# Patient Record
Sex: Male | Born: 1970 | Race: White | Hispanic: No | Marital: Married | Smoking: Never smoker
Health system: Southern US, Community
[De-identification: ages and names within clinical notes are randomized; demographics above are authoritative.]

## PROBLEM LIST (undated history)

## (undated) DIAGNOSIS — L578 Other skin changes due to chronic exposure to nonionizing radiation: Secondary | ICD-10-CM

## (undated) DIAGNOSIS — G4730 Sleep apnea, unspecified: Secondary | ICD-10-CM

## (undated) DIAGNOSIS — L910 Hypertrophic scar: Secondary | ICD-10-CM

## (undated) DIAGNOSIS — Z0000 Encounter for general adult medical examination without abnormal findings: Secondary | ICD-10-CM

## (undated) DIAGNOSIS — G35D Multiple sclerosis, unspecified: Secondary | ICD-10-CM

## (undated) DIAGNOSIS — L259 Unspecified contact dermatitis, unspecified cause: Secondary | ICD-10-CM

## (undated) DIAGNOSIS — K5900 Constipation, unspecified: Secondary | ICD-10-CM

## (undated) DIAGNOSIS — J209 Acute bronchitis, unspecified: Secondary | ICD-10-CM

## (undated) DIAGNOSIS — I10 Essential (primary) hypertension: Secondary | ICD-10-CM

## (undated) DIAGNOSIS — E349 Endocrine disorder, unspecified: Secondary | ICD-10-CM

## (undated) DIAGNOSIS — E039 Hypothyroidism, unspecified: Secondary | ICD-10-CM

## (undated) DIAGNOSIS — I2510 Atherosclerotic heart disease of native coronary artery without angina pectoris: Secondary | ICD-10-CM

## (undated) DIAGNOSIS — K219 Gastro-esophageal reflux disease without esophagitis: Secondary | ICD-10-CM

## (undated) DIAGNOSIS — H539 Unspecified visual disturbance: Secondary | ICD-10-CM

## (undated) DIAGNOSIS — E669 Obesity, unspecified: Secondary | ICD-10-CM

## (undated) DIAGNOSIS — F418 Other specified anxiety disorders: Secondary | ICD-10-CM

## (undated) DIAGNOSIS — B353 Tinea pedis: Secondary | ICD-10-CM

## (undated) DIAGNOSIS — R5383 Other fatigue: Secondary | ICD-10-CM

## (undated) DIAGNOSIS — I219 Acute myocardial infarction, unspecified: Secondary | ICD-10-CM

## (undated) DIAGNOSIS — T7840XA Allergy, unspecified, initial encounter: Secondary | ICD-10-CM

## (undated) DIAGNOSIS — E785 Hyperlipidemia, unspecified: Secondary | ICD-10-CM

## (undated) DIAGNOSIS — B351 Tinea unguium: Secondary | ICD-10-CM

## (undated) DIAGNOSIS — F32A Depression, unspecified: Secondary | ICD-10-CM

## (undated) DIAGNOSIS — B019 Varicella without complication: Secondary | ICD-10-CM

## (undated) DIAGNOSIS — I83819 Varicose veins of unspecified lower extremities with pain: Secondary | ICD-10-CM

## (undated) DIAGNOSIS — F329 Major depressive disorder, single episode, unspecified: Secondary | ICD-10-CM

## (undated) DIAGNOSIS — B372 Candidiasis of skin and nail: Secondary | ICD-10-CM

## (undated) DIAGNOSIS — G35 Multiple sclerosis: Secondary | ICD-10-CM

## (undated) DIAGNOSIS — G709 Myoneural disorder, unspecified: Secondary | ICD-10-CM

## (undated) HISTORY — DX: Major depressive disorder, single episode, unspecified: F32.9

## (undated) HISTORY — DX: Essential (primary) hypertension: I10

## (undated) HISTORY — DX: Varicella without complication: B01.9

## (undated) HISTORY — DX: Unspecified visual disturbance: H53.9

## (undated) HISTORY — DX: Hyperlipidemia, unspecified: E78.5

## (undated) HISTORY — DX: Acute bronchitis, unspecified: J20.9

## (undated) HISTORY — DX: Unspecified contact dermatitis, unspecified cause: L25.9

## (undated) HISTORY — DX: Encounter for general adult medical examination without abnormal findings: Z00.00

## (undated) HISTORY — DX: Obesity, unspecified: E66.9

## (undated) HISTORY — DX: Multiple sclerosis, unspecified: G35.D

## (undated) HISTORY — DX: Atherosclerotic heart disease of native coronary artery without angina pectoris: I25.10

## (undated) HISTORY — DX: Depression, unspecified: F32.A

## (undated) HISTORY — DX: Allergy, unspecified, initial encounter: T78.40XA

## (undated) HISTORY — PX: OTHER SURGICAL HISTORY: SHX169

## (undated) HISTORY — DX: Other fatigue: R53.83

## (undated) HISTORY — DX: Tinea unguium: B35.1

## (undated) HISTORY — DX: Other specified anxiety disorders: F41.8

## (undated) HISTORY — DX: Hypertrophic scar: L91.0

## (undated) HISTORY — DX: Constipation, unspecified: K59.00

## (undated) HISTORY — DX: Other skin changes due to chronic exposure to nonionizing radiation: L57.8

## (undated) HISTORY — DX: Acute myocardial infarction, unspecified: I21.9

## (undated) HISTORY — DX: Endocrine disorder, unspecified: E34.9

## (undated) HISTORY — PX: CORONARY ANGIOPLASTY WITH STENT PLACEMENT: SHX49

## (undated) HISTORY — DX: Tinea pedis: B35.3

## (undated) HISTORY — DX: Varicose veins of unspecified lower extremity with pain: I83.819

## (undated) HISTORY — DX: Multiple sclerosis: G35

## (undated) HISTORY — PX: VASECTOMY: SHX75

## (undated) HISTORY — PX: WISDOM TOOTH EXTRACTION: SHX21

## (undated) HISTORY — PX: VASCULAR SURGERY: SHX849

## (undated) HISTORY — DX: Gastro-esophageal reflux disease without esophagitis: K21.9

## (undated) HISTORY — DX: Candidiasis of skin and nail: B37.2

## (undated) HISTORY — DX: Hypothyroidism, unspecified: E03.9

---

## 2007-06-09 ENCOUNTER — Encounter (HOSPITAL_COMMUNITY): Admission: RE | Admit: 2007-06-09 | Discharge: 2007-09-07 | Payer: Self-pay | Admitting: Neurology

## 2007-09-30 ENCOUNTER — Encounter (HOSPITAL_COMMUNITY): Admission: RE | Admit: 2007-09-30 | Discharge: 2007-12-08 | Payer: Self-pay | Admitting: Neurology

## 2011-04-20 ENCOUNTER — Emergency Department (HOSPITAL_COMMUNITY): Payer: Managed Care, Other (non HMO)

## 2011-04-20 ENCOUNTER — Inpatient Hospital Stay (HOSPITAL_COMMUNITY)
Admission: EM | Admit: 2011-04-20 | Discharge: 2011-04-23 | DRG: 247 | Disposition: A | Discharge status: Home / Self Care | Payer: Managed Care, Other (non HMO) | Attending: Cardiology | Admitting: Cardiology

## 2011-04-20 DIAGNOSIS — R079 Chest pain, unspecified: Secondary | ICD-10-CM

## 2011-04-20 DIAGNOSIS — E785 Hyperlipidemia, unspecified: Secondary | ICD-10-CM | POA: Diagnosis present

## 2011-04-20 DIAGNOSIS — M216X9 Other acquired deformities of unspecified foot: Secondary | ICD-10-CM | POA: Diagnosis present

## 2011-04-20 DIAGNOSIS — I10 Essential (primary) hypertension: Secondary | ICD-10-CM | POA: Diagnosis present

## 2011-04-20 DIAGNOSIS — G35 Multiple sclerosis: Secondary | ICD-10-CM | POA: Diagnosis present

## 2011-04-20 DIAGNOSIS — I214 Non-ST elevation (NSTEMI) myocardial infarction: Principal | ICD-10-CM | POA: Diagnosis present

## 2011-04-20 DIAGNOSIS — E039 Hypothyroidism, unspecified: Secondary | ICD-10-CM | POA: Diagnosis present

## 2011-04-20 DIAGNOSIS — I2510 Atherosclerotic heart disease of native coronary artery without angina pectoris: Secondary | ICD-10-CM | POA: Diagnosis present

## 2011-04-20 DIAGNOSIS — I219 Acute myocardial infarction, unspecified: Secondary | ICD-10-CM

## 2011-04-20 DIAGNOSIS — Z7982 Long term (current) use of aspirin: Secondary | ICD-10-CM

## 2011-04-20 HISTORY — DX: Acute myocardial infarction, unspecified: I21.9

## 2011-04-20 LAB — CBC
HCT: 40.2 % (ref 39.0–52.0)
Hemoglobin: 13.8 g/dL (ref 13.0–17.0)
MCH: 29.8 pg (ref 26.0–34.0)
MCHC: 34.3 g/dL (ref 30.0–36.0)
MCV: 86.8 fL (ref 78.0–100.0)
Platelets: 237 10*3/uL (ref 150–400)
RBC: 4.63 MIL/uL (ref 4.22–5.81)
RDW: 14.3 % (ref 11.5–15.5)
WBC: 11.6 10*3/uL — ABNORMAL HIGH (ref 4.0–10.5)

## 2011-04-20 LAB — DIFFERENTIAL
Basophils Absolute: 0.1 10*3/uL (ref 0.0–0.1)
Basophils Relative: 0 % (ref 0–1)
Eosinophils Absolute: 0 10*3/uL (ref 0.0–0.7)
Eosinophils Relative: 0 % (ref 0–5)
Lymphocytes Relative: 14 % (ref 12–46)
Lymphs Abs: 1.7 10*3/uL (ref 0.7–4.0)
Monocytes Absolute: 0.6 10*3/uL (ref 0.1–1.0)
Monocytes Relative: 5 % (ref 3–12)
Neutro Abs: 9.4 10*3/uL — ABNORMAL HIGH (ref 1.7–7.7)
Neutrophils Relative %: 80 % — ABNORMAL HIGH (ref 43–77)

## 2011-04-20 LAB — COMPREHENSIVE METABOLIC PANEL
ALT: 27 U/L (ref 0–53)
AST: 25 U/L (ref 0–37)
Albumin: 4.1 g/dL (ref 3.5–5.2)
Alkaline Phosphatase: 65 U/L (ref 39–117)
BUN: 22 mg/dL (ref 6–23)
CO2: 25 mEq/L (ref 19–32)
Calcium: 9.3 mg/dL (ref 8.4–10.5)
Chloride: 103 mEq/L (ref 96–112)
Creatinine, Ser: 0.89 mg/dL (ref 0.4–1.5)
GFR calc Af Amer: >60 mL/min (ref >60)
GFR calc non Af Amer: >60 mL/min (ref >60)
Glucose, Bld: 160 mg/dL — ABNORMAL HIGH (ref 70–99)
Potassium: 4.2 mEq/L (ref 3.5–5.1)
Sodium: 138 mEq/L (ref 135–145)
Total Bilirubin: 0.4 mg/dL (ref 0.3–1.2)
Total Protein: 7.2 g/dL (ref 6.0–8.3)

## 2011-04-20 LAB — POCT I-STAT, CHEM 8
BUN: 23 mg/dL (ref 6–23)
Calcium, Ion: 1.11 mmol/L — ABNORMAL LOW (ref 1.12–1.32)
Chloride: 107 mEq/L (ref 96–112)
Creatinine, Ser: 1.1 mg/dL (ref 0.4–1.5)
Glucose, Bld: 160 mg/dL — ABNORMAL HIGH (ref 70–99)
HCT: 43 % (ref 39.0–52.0)
Hemoglobin: 14.6 g/dL (ref 13.0–17.0)
Potassium: 4.1 mEq/L (ref 3.5–5.1)
Sodium: 140 mEq/L (ref 135–145)
TCO2: 23 mmol/L (ref 0–100)

## 2011-04-20 LAB — CK TOTAL AND CKMB (NOT AT ARMC)
CK, MB: 12.8 ng/mL (ref 0.3–4.0)
Relative Index: 6.4 — ABNORMAL HIGH (ref 0.0–2.5)
Total CK: 200 U/L (ref 7–232)

## 2011-04-20 LAB — LIPASE, BLOOD: Lipase: 32 U/L (ref 11–59)

## 2011-04-20 LAB — TROPONIN I: Troponin I: 1.17 ng/mL (ref <0.30)

## 2011-04-20 LAB — POCT CARDIAC MARKERS
CKMB, poc: 7.9 ng/mL (ref 1.0–8.0)
Myoglobin, poc: 91.3 ng/mL (ref 12–200)
Troponin i, poc: 0.35 ng/mL (ref 0.00–0.09)

## 2011-04-21 DIAGNOSIS — I2510 Atherosclerotic heart disease of native coronary artery without angina pectoris: Secondary | ICD-10-CM

## 2011-04-21 LAB — BASIC METABOLIC PANEL
BUN: 17 mg/dL (ref 6–23)
CO2: 29 mEq/L (ref 19–32)
Calcium: 9.1 mg/dL (ref 8.4–10.5)
Chloride: 105 mEq/L (ref 96–112)
Creatinine, Ser: 0.78 mg/dL (ref 0.4–1.5)
GFR calc Af Amer: >60 mL/min (ref >60)
GFR calc non Af Amer: >60 mL/min (ref >60)
Glucose, Bld: 116 mg/dL — ABNORMAL HIGH (ref 70–99)
Potassium: 4.2 mEq/L (ref 3.5–5.1)
Sodium: 140 mEq/L (ref 135–145)

## 2011-04-21 LAB — CARDIAC PANEL(CRET KIN+CKTOT+MB+TROPI)
CK, MB: 5 ng/mL — ABNORMAL HIGH (ref 0.3–4.0)
Relative Index: INVALID (ref 0.0–2.5)
Total CK: 85 U/L (ref 7–232)
Troponin I: 2.2 ng/mL (ref <0.30)

## 2011-04-21 LAB — CBC
HCT: 38.1 % — ABNORMAL LOW (ref 39.0–52.0)
Hemoglobin: 12.7 g/dL — ABNORMAL LOW (ref 13.0–17.0)
MCH: 28.9 pg (ref 26.0–34.0)
MCHC: 33.3 g/dL (ref 30.0–36.0)
MCV: 86.6 fL (ref 78.0–100.0)
Platelets: 227 10*3/uL (ref 150–400)
RBC: 4.4 MIL/uL (ref 4.22–5.81)
RDW: 14.4 % (ref 11.5–15.5)
WBC: 11.7 10*3/uL — ABNORMAL HIGH (ref 4.0–10.5)

## 2011-04-21 LAB — MRSA PCR SCREENING: MRSA by PCR: NEGATIVE

## 2011-04-21 LAB — PROTIME-INR
INR: 1 (ref 0.00–1.49)
Prothrombin Time: 13.4 seconds (ref 11.6–15.2)

## 2011-04-21 LAB — LIPID PANEL
Cholesterol: 152 mg/dL (ref 0–200)
HDL: 70 mg/dL (ref >39)
LDL Cholesterol: 73 mg/dL (ref 0–99)
Total CHOL/HDL Ratio: 2.2 RATIO
Triglycerides: 44 mg/dL (ref <150)
VLDL: 9 mg/dL (ref 0–40)

## 2011-04-21 LAB — TSH: TSH: 0.231 u[IU]/mL — ABNORMAL LOW (ref 0.350–4.500)

## 2011-04-21 LAB — POCT ACTIVATED CLOTTING TIME: Activated Clotting Time: 470 seconds

## 2011-04-21 LAB — HEPARIN LEVEL (UNFRACTIONATED): Heparin Unfractionated: 0.16 IU/mL — ABNORMAL LOW (ref 0.30–0.70)

## 2011-04-22 LAB — BASIC METABOLIC PANEL
BUN: 14 mg/dL (ref 6–23)
CO2: 30 mEq/L (ref 19–32)
Calcium: 9.3 mg/dL (ref 8.4–10.5)
Chloride: 105 mEq/L (ref 96–112)
Creatinine, Ser: 0.84 mg/dL (ref 0.4–1.5)
GFR calc Af Amer: >60 mL/min (ref >60)
GFR calc non Af Amer: >60 mL/min (ref >60)
Glucose, Bld: 91 mg/dL (ref 70–99)
Potassium: 4.2 mEq/L (ref 3.5–5.1)
Sodium: 142 mEq/L (ref 135–145)

## 2011-04-22 LAB — POCT ACTIVATED CLOTTING TIME: Activated Clotting Time: 393 seconds

## 2011-04-22 LAB — CBC
HCT: 40.7 % (ref 39.0–52.0)
Hemoglobin: 13.5 g/dL (ref 13.0–17.0)
MCH: 29.2 pg (ref 26.0–34.0)
MCHC: 33.2 g/dL (ref 30.0–36.0)
MCV: 88.1 fL (ref 78.0–100.0)
Platelets: 201 10*3/uL (ref 150–400)
RBC: 4.62 MIL/uL (ref 4.22–5.81)
RDW: 14.4 % (ref 11.5–15.5)
WBC: 10.8 10*3/uL — ABNORMAL HIGH (ref 4.0–10.5)

## 2011-04-23 DIAGNOSIS — I214 Non-ST elevation (NSTEMI) myocardial infarction: Secondary | ICD-10-CM

## 2011-04-23 LAB — BASIC METABOLIC PANEL
BUN: 12 mg/dL (ref 6–23)
CO2: 30 mEq/L (ref 19–32)
Calcium: 8.8 mg/dL (ref 8.4–10.5)
Chloride: 105 mEq/L (ref 96–112)
Creatinine, Ser: 0.92 mg/dL (ref 0.4–1.5)
GFR calc Af Amer: >60 mL/min (ref >60)
GFR calc non Af Amer: >60 mL/min (ref >60)
Glucose, Bld: 86 mg/dL (ref 70–99)
Potassium: 4 mEq/L (ref 3.5–5.1)
Sodium: 141 mEq/L (ref 135–145)

## 2011-04-23 LAB — CBC
HCT: 39.5 % (ref 39.0–52.0)
Hemoglobin: 13.1 g/dL (ref 13.0–17.0)
MCH: 28.7 pg (ref 26.0–34.0)
MCHC: 33.2 g/dL (ref 30.0–36.0)
MCV: 86.4 fL (ref 78.0–100.0)
Platelets: 192 10*3/uL (ref 150–400)
RBC: 4.57 MIL/uL (ref 4.22–5.81)
RDW: 14 % (ref 11.5–15.5)
WBC: 9.6 10*3/uL (ref 4.0–10.5)

## 2011-04-28 ENCOUNTER — Telehealth: Payer: Self-pay | Admitting: Cardiovascular Disease

## 2011-04-28 NOTE — Telephone Encounter (Signed)
Pt having a filling at the dentist, will he require any meds?

## 2011-04-28 NOTE — Discharge Summary (Addendum)
NAME:  Keith Morrow, Keith Morrow NO.:  [id number]  MEDICAL RECORD NO.:  [id number]           PATIENT TYPE:  I  LOCATION:  6525                         FACILITY:  [facility]  PHYSICIAN:  Veverly Fells. Excell Seltzer, MD  DATE OF BIRTH:  02/04/1971  DATE OF ADMISSION:  04/20/2011 DATE OF DISCHARGE:  04/23/2011                              DISCHARGE SUMMARY   DISCHARGE DIAGNOSES: 1. Non-ST-elevation myocardial infarction with peak troponin of 2.20     this admission. 2. Newly diagnosed coronary artery disease.     a.     Status post percutaneous coronary intervention/drug-eluting      stent placement to the left anterior descending and balloon      angioplasty of the diagonal, Apr 21, 2011.     b.     Status post staged percutaneous coronary intervention/drug-      eluting stent placement to the right posterior atrioventricular      branch, Apr 22, 2011. 3. Normal left ventricular function by catheterization, Apr 21, 2011. 4. Hypothyroidism.     a.     TSH approximately 0.231, require outpatient review with      primary care doctor. 5. Multiple sclerosis. 6. Hypertension. 7. Hyperlipidemia, Lipitor increased to 40 mg at bedtime. 8. Status post right wrist surgery. 9. Status post vasectomy.  HOSPITAL COURSE:  Keith Morrow is a 40 year old gentleman with a history of hypertension, hyperlipidemia, multiple sclerosis who has no prior cardiac history.  He has had some chest discomfort on and off for a couple of weeks, and while working in the yard prior to the date of admission had severe chest discomfort with some discomfort in his back as well.  On day of admission, he had same discomfort at rest, somewhat radiating to his jaw, associated with nausea and diaphoresis.  He subsequently came to [facility] where initial cardiac enzymes were elevated to a peak troponin of 2.20.  He did have 1-mm ST-elevation in lead V2 only with ischemic T-wave changes in V2 and V4.  He was pain free on  evaluation by Cardiology.  He was treated with aspirin, heparin, and loaded with 150 mg of Plavix at 75 mg daily.  He was taken to the cath lab on Apr 21, 2011 by Dr. Excell Seltzer and subsequently had drug-eluting stents placement to the proximal LAD and first diagonal.  He also had residual PLA branch stenosis, nonobstructive left circumflex stenosis. He was brought back for staged PCI and stenting of the posterior AV segment on Apr 22, 2011 by Dr. Excell Seltzer.  The patient tolerated this procedure well.  Dr. Excell Seltzer has seen and examined him today and feels he is stable for discharge.  DISCHARGE LABORATORY DATA:  WBC 9.6, hemoglobin 13.1, hematocrit 39.5, and platelet count 192.  Sodium 141, potassium 4, chloride 105, CO2 30, glucose 86, BUN 12, and creatinine 0.92.  TSH was 0.231.  Total cholesterol 152, triglycerides 44, HDL 70, and LDL 73.  STUDIES: 1. Chest x-ray, Apr 20, 2011 showed no acute cardiopulmonary findings. 2. Cardiac catheterization, May 14 and Apr 22, 2011, please see full     report  for details.  DISCHARGE MEDICATIONS: 1. Aspirin 81 mg daily. 2. Lopressor 25 mg b.i.d. 3. Nitro sublingual 0.4 mg every 5 minutes as needed up to 3 doses for     chest pain. 4. Effient 10 mg daily. 5. Lipitor 40 mg at bedtime, increased per Dr. Excell Seltzer. 6. Ampyra 10 mg b.i.d. 7. Benicar 20 mg daily. 8. Cymbalta 60 mg b.i.d. 9. Prilosec 20 mg every morning. 10.Synthroid 150 mcg every evening, on discharge to follow up with PCP     for repeat TSH and free T4. 11.Triamcinolone 0.1% topical cream 1 application topically b.i.d.  He was taking prednisone taper for a poison ivy prior to admission, this was discontinued.  DISPOSITION:  Keith Morrow will be discharged in stable condition to home. Per Dr. Excell Seltzer, he is not to return to work until May 06, 2011.  He is not to lift anything over 5 pounds for 1 week, drive for 2 days, and participate in sexual activity for 1 week.  He is to follow a  heart- healthy diet and call or return if he notices any pain, swelling, bleeding, or pus at the cath site.  As above, he is also to follow up with his PCP for recheck of his TSH and free T4.  He will follow up with Dr. Earmon Phoenix office to see [name], PA-C for his first followup visit on May 20, 2011 at 9:30 a.m.  DURATION OF DISCHARGE ENCOUNTER:  Greater than 30 minutes including physician and PA time.     Ronie Spies, P.A.C.   ______________________________ Veverly Fells. Excell Seltzer, MD    DD/MEDQ  D:  04/23/2011  T:  04/23/2011  Job:  [id number]  cc:   Dr. Rosebud Poles, MD  Electronically Signed by Ronie Spies  on 04/28/2011 01:59:51 PM Electronically Signed by Tonny Bollman MD on 05/29/2011 07:32:42 AM

## 2011-04-29 ENCOUNTER — Telehealth: Payer: Self-pay | Admitting: Cardiovascular Disease

## 2011-04-29 NOTE — Telephone Encounter (Signed)
Pt needs to have a filling done and is in the chair dose he need pre med call asap

## 2011-04-29 NOTE — Telephone Encounter (Signed)
I spoke with Dr Lilyan Punt office and made them aware that the pt just had a recent stent placed 04/21/11 and 04/22/11.  I made them aware that the pt should have SBE due to recent stent placement.  They will prescribe Amoxicillin 500mg  take 4 capsules by mouth one hour prior to dental work.

## 2011-04-29 NOTE — Telephone Encounter (Signed)
RN reviewed chart: Pt had stent placed on Apr 21, 2011 and should have SBE. RN attempted 3 times to call Dentist's office; line busy.

## 2011-04-29 NOTE — Telephone Encounter (Signed)
Per note from Mylo Red RN the pt's dental office was closed when she called the office on 04/28/11.  Please see new message on 04/29/11.

## 2011-04-30 ENCOUNTER — Telehealth: Payer: Self-pay | Admitting: Cardiovascular Disease

## 2011-04-30 NOTE — Telephone Encounter (Signed)
[phone] pt rtn call

## 2011-04-30 NOTE — Telephone Encounter (Signed)
Left message for pt to call back  °

## 2011-04-30 NOTE — Telephone Encounter (Signed)
I spoke with the pt and he complains of itching at his cath site (right radial artery) that started two days ago.  Yesterday the pt noticed a red rash around the insertion site about the size of a half dollar. The pt has "slight swelling" at site and it is not warm to touch.  The pt denies any pain at site and the bruising is improving.  The pt is not allergic to latex or tape to his knowledge.  I offered to have the pt come into the office to have me look at his site and then make a decision if he requires a vascular ultrasound.  The pt asked if he could put Benadryl cream on the site.  I made the pt aware that he could try hydrocortisone cream as needed.  The pt would like to try this today and then call the office tomorrow if he does not have any improvement in symptoms.

## 2011-04-30 NOTE — Telephone Encounter (Signed)
Pt having redness, itching and swelling at stent placement site-was done last week-pls advise

## 2011-05-06 ENCOUNTER — Encounter: Payer: Self-pay | Admitting: Physician Assistant

## 2011-05-13 ENCOUNTER — Encounter: Payer: Self-pay | Admitting: Physician Assistant

## 2011-05-13 ENCOUNTER — Ambulatory Visit (INDEPENDENT_AMBULATORY_CARE_PROVIDER_SITE_OTHER): Payer: Managed Care, Other (non HMO) | Admitting: Physician Assistant

## 2011-05-13 VITALS — BP 114/72 | HR 65 | Ht 76.0 in | Wt 284.0 lb

## 2011-05-13 DIAGNOSIS — I2510 Atherosclerotic heart disease of native coronary artery without angina pectoris: Secondary | ICD-10-CM | POA: Insufficient documentation

## 2011-05-13 DIAGNOSIS — I10 Essential (primary) hypertension: Secondary | ICD-10-CM

## 2011-05-13 DIAGNOSIS — E785 Hyperlipidemia, unspecified: Secondary | ICD-10-CM | POA: Insufficient documentation

## 2011-05-13 DIAGNOSIS — G35 Multiple sclerosis: Secondary | ICD-10-CM | POA: Insufficient documentation

## 2011-05-13 DIAGNOSIS — E039 Hypothyroidism, unspecified: Secondary | ICD-10-CM | POA: Insufficient documentation

## 2011-05-13 LAB — TSH: TSH: 0.69 u[IU]/mL (ref 0.35–5.50)

## 2011-05-13 LAB — T4, FREE: Free T4: 0.91 ng/dL (ref 0.60–1.60)

## 2011-05-13 NOTE — Assessment & Plan Note (Signed)
Managed by PCP.  Goal LDL < 70.  Needs repeat Lipids in 2-3 mos.

## 2011-05-13 NOTE — Assessment & Plan Note (Signed)
Notes BP low at times.  But, mostly in 110 range systolically.  He will monitor.  I advised him to cut Benicar in half if symptoms continue.

## 2011-05-13 NOTE — Assessment & Plan Note (Signed)
Doing well post MI and PCI.  No angina.  Continue asa, Effient and statin.  Discussed importance of dual anti-platelet therapy.  Refer to cardiac rehab.  Discussed diet and salt today.  Discussed how to increase activity.  Discussed use of NTG.  Discussed need for control of lipids.  Schedule follow up with Dr. Excell Seltzer in 6-8 weeks.

## 2011-05-13 NOTE — Patient Instructions (Signed)
Your physician recommends that you schedule a follow-up appointment in: 6-8 WEEKS WITH DR. Excell Seltzer AS PER SCOTT WEAVER, PA-C  You have been referred to CARDIAC REHAB 414.01, 401.9 PRIMARY CARD IS COOPER.  Your physician recommends that you return for lab work in: TODAY TSH, FREE T4 244.9 HYPOTHYROIDISM WE WILL FAX RESULTS TO YOUR PRIMARY CARE PHYSICIAN.  Your physician recommends that you continue on your current medications as directed. Please refer to the Current Medication list given to you today.

## 2011-05-13 NOTE — Assessment & Plan Note (Signed)
TSH low in hospital.  Will repeat TSH and Free T4 and send results to PCP.

## 2011-05-13 NOTE — Progress Notes (Signed)
History of Present Illness: Primary Cardiologist: Dr. Tonny Bollman  Keith Morrow is a 40 y.o. male with a h/o HTN, HLP, hypothyroidism and multiple sclerosis who presented to [facility] with chest pain on 5/13.  He r/i for NSTEMI and underwent cath with Dr. Excell Seltzer on 5/14 that revealed a mid LAD/ostial Dx bifurcation lesion.  LAD 90-95% lesion treated with Promus DES and Dx 90% lesion treated with POBA.  He also had a PL branch with 80-90% stenosis and was brought back the next day for staged PCI with a Promus DES.  EF 55%.  Post PCI course uneventful.  He presents for follow up.  Labs: Na 141, K 4, Creat 0.92, Hgb 13.1, TC 152, TG 44, HDL 70, LDL 73, ALT 27, TSH 0.231 (needs follow up); CXR non-acute  Doing well.  Has been walking a lot.  Denies chest pain.  No dyspnea.  No syncope or light headedness.  No orthopnea, PND or edema.  Gets fatigued sometimes and notes BP in 90s systolic.  Has lots of questions today about diet, medications, follow up.  I spent about 30 minutes today answering all of his questions.   Past Medical History  Diagnosis Date  . HTN (hypertension)   . Hypothyroidism   . Hyperlipidemia   . MS (multiple sclerosis)   . Coronary atherosclerosis of native coronary artery     a. NSTEMI 04/20/11: tx with Promus DES to LAD; bifurcation lesion with oDx 90% tx with POBA; b. staged PCI of right post. AV Branch with Promus DES; c. residual at cath 04/21/11:  AV groove CFX 50%, mRCA 30%,; EF 55%    Current Outpatient Prescriptions  Medication Sig Dispense Refill  . atorvastatin (LIPITOR) 40 MG tablet Take 40 mg by mouth daily.        . Dalfampridine (AMPYRA) 10 MG TB12 Take by mouth. Every 12 hours       . DULoxetine (CYMBALTA) 60 MG capsule Take 60 mg by mouth 2 (two) times daily.        Marland Kitchen levothyroxine (SYNTHROID, LEVOTHROID) 150 MCG tablet Take 150 mcg by mouth daily.        . metoprolol (LOPRESSOR) 50 MG tablet Take 50 mg by mouth 2 (two) times daily.        Marland Kitchen olmesartan  (BENICAR) 20 MG tablet Take 20 mg by mouth daily.        Marland Kitchen omeprazole (PRILOSEC) 20 MG capsule Take 20 mg by mouth daily.        . prasugrel (EFFIENT) 10 MG TABS Take by mouth daily.        Marland Kitchen DISCONTD: atorvastatin (LIPITOR) 10 MG tablet Take 10 mg by mouth daily.          Allergies: No Known Allergies  Vital Signs: BP 114/72  Pulse 65  Ht 6\' 4"  (1.93 m)  Wt 284 lb (128.822 kg)  BMI 34.57 kg/m2  PHYSICAL EXAM: Well nourished, well developed, in no acute distress HEENT: normal Neck: no JVD Cardiac:  normal S1, S2; RRR; no murmur Lungs:  clear to auscultation bilaterally, no wheezing, rhonchi or rales Abd: soft, nontender, no hepatomegaly Ext: no edema; right radial site without hematoma or bruits Skin: warm and dry Neuro:  CNs 2-12 intact, no focal abnormalities noted  EKG:  Sinus Bradycardia, HR 59, normal axis, TW inversions V1-V5, no significant change from 04/23/11  ASSESSMENT AND PLAN:

## 2011-05-20 ENCOUNTER — Encounter: Payer: Managed Care, Other (non HMO) | Admitting: Physician Assistant

## 2011-05-28 NOTE — H&P (Signed)
NAME:  Keith Morrow, Keith Morrow NO.:  [id number]  MEDICAL RECORD NO.:  [id number]           PATIENT TYPE:  I  LOCATION:  2906                         FACILITY:  [facility]  PHYSICIAN:  Rollene Rotunda, MD, FACCDATE OF BIRTH:  09/22/1971  DATE OF ADMISSION:  04/20/2011 DATE OF DISCHARGE:                             HISTORY & PHYSICAL   PRIMARY CARE PHYSICIAN:  Warrick Parisian, MD  REASON FOR PRESENTATION:  Evaluate patient with chest pain.  HISTORY OF PRESENT ILLNESS:  The patient is a pleasant 40 year old gentleman without prior cardiac history.  He has a history of multiple sclerosis.  He reports he has had some chest discomfort on and off sporadically over a couple of weeks.  He has not paid much attention to this.  Yesterday, he apparently had some severe chest discomfort.  This was while working in the yard.  It was substernal with some discomfort in his back feeling like he had been punched.  It went away spontaneously after about 30 minutes.  Today at rest, he had the same discomfort.  Again, he felt that same substernal and back discomfort, somewhat radiating to his jaw.  He was nauseated and had some diaphoresis.  His peak pain was 9/10.  He took some Tylenol.  His pain abated after about 30 minutes.  However, he was convinced to come to the ER by his wife.  Here, he did have some mildly elevated troponins. Subsequent cardiac markers demonstrated troponin 1.17.  His CK is 200 with an MB of 12.8.  He does have 1-mm ST elevation in lead V2 only with ischemic T-wave changes in V2-V4.  He is currently pain-free.  The patient otherwise has not had cardiac problems or a workup in the past.  He does have multiple sclerosis and gets around with a cane mostly related to right foot drop.  With this level of activity, he not been able to bring on any discomfort.  He has had no new shortness of breath, PND or orthopnea.  He does not report palpitations, presyncope or  syncope.  PAST MEDICAL HISTORY: 1. Hypothyroidism. 2. Hyperlipidemia. 3. Multiple sclerosis. 4. Hypertension.  PAST SURGICAL HISTORY:  Right wrist surgery, vasectomy.  ALLERGIES:  None.  MEDICATIONS: 1. Benicar 20 mg daily. 2. Cymbalta 60 mg b.i.d. 3. Lipitor 10 mg daily. 4. Synthroid 150 mcg daily. 5. Tysabri. 6. Ampyra 10 mg q.12 hours. 7. Omeprazole 20 mg daily.  SOCIAL HISTORY:  The patient is married.  He has three young children. He has never smoked cigarettes.  FAMILY HISTORY:  Questionable for his father having coronary artery disease in his 19s (unclear on this).  REVIEW OF SYSTEMS:  Is stated in the HPI negative for all other systems.  PHYSICAL EXAMINATION:  GENERAL:  The patient is pleasant and in no distress. VITAL SIGNS:  Blood pressure 114/70, heart rate 83 and regular, afebrile, respiratory rate 16. HEENT:  Eyes unremarkable.  Pupils equal, round and reactive to light. Fundi not visualized.  Oral mucosa unremarkable. NECK:  No jugular venous distention at 45 degrees.  Carotid upstroke brisk and symmetric.  No bruits, no thyromegaly.  LYMPHATICS:  No cervical, axillary or inguinal adenopathy. LUNGS:  Clear to auscultation bilaterally. BACK:  No costovertebral angle tenderness. CHEST:  Unremarkable. HEART:  PMI not displaced or sustained, S1-S2 within normal limits.  No S3, no S4, no clicks, no rubs, no murmurs. ABDOMEN:  Flat, positive bowel sounds, normal in frequency and pitch. No bruits, no rebound, no guarding, no midline pulsatile mass, no hepatomegaly, no splenomegaly. SKIN:  No rashes, no nodules. EXTREMITIES:  2+ pulses throughout.  No cyanosis, no clubbing, no edema. NEURO:  Oriented to person, place and time, cranial nerves II-XII grossly intact, motor significant for decreased strength to plantar and dorsiflexion of his right foot.  LABORATORIES:  Sodium 138, potassium 4.2, BUN 22, creatinine 0.89, lipase 32.  Chest x-ray no acute  disease.  EKG as described.  ASSESSMENT/PLAN: 1. Acute coronary syndrome.  The patient does have an acute coronary     syndrome with positive cardiac markers and an abnormal EKG.  He is     pain-free.  He has minimal ST elevation in one lead only.  At this     point, I suspect high-grade left anterior descending (coronary)     artery stenosis.  He will be treated with aspirin, heparin, 150 mg     of Plavix loaded and 75 mg daily.  He will be taken to the     catheterization lab electively unless he has recurrent symptoms.     He will have aggressive risk reduction. 2. Dyslipidemia.  For now continue Lipitor 10 mg.  I would be worried     about exacerbating multiple sclerosis     symptoms with high-dose statins.  I will check a lipid level and     treat accordingly. 3. Hypothyroidism.  Check a TSH. 4. Multiple sclerosis.  He will continue the medications as listed.     Rollene Rotunda, MD, [facility]     JH/MEDQ  D:  04/20/2011  T:  04/21/2011  Job:  [id number]  cc:   Warrick Parisian, MD  Electronically Signed by Rollene Rotunda MD [employer] on 05/28/2011 11:40:55 AM

## 2011-05-29 NOTE — Cardiovascular Report (Signed)
  NAME:  BOHDI, LEEDS NO.:  [id number]  MEDICAL RECORD NO.:  [id number]           PATIENT TYPE:  I  LOCATION:  6525                         FACILITY:  [facility]  PHYSICIAN:  Veverly Fells. Excell Seltzer, MD  DATE OF BIRTH:  Nov 20, 1971  DATE OF PROCEDURE:  04/22/2011 DATE OF DISCHARGE:                           CARDIAC CATHETERIZATION   PROCEDURE: 1. Selective coronary angiography. 2. PTCA and stenting of the posterior AV segment.  PROCEDURAL INDICATIONS:  Mr. Retter is a 40 year old gentleman who presented with non-ST-elevation infarction.  It was found to have critical LAD stenosis and was treated yesterday with complex PTCA and stenting of the LAD diagonal bifurcation.  He was also noted to have severe posterolateral stenosis and was brought in today for staged PCI.  The right wrist was prepped, draped and anesthetized with 1% lidocaine. Using modified Seldinger technique, a 6-French sheath was placed in the right radial artery without difficulty, 3 mg of verapamil was administered through the sheath.  Bivalirudin was used for anticoagulation.  A JL-3.5 cm diagnostic catheter was used to re-image the left coronary artery to reassess the LAD diagonal bifurcation and make sure the diagonal was still patent.  The result from yesterday remained stable with wide patency of the stented segment in the LAD and continued TIMI III flow in the diagonal with tightly narrowed diagonal ostium.  Attention was then turned to the right coronary artery.  A JR-4 guide catheter was inserted.  Once a therapeutic ACT was achieved, a Cougar guidewire was advanced into the distal posterolateral branch across the lesion.  The vessel was predilated with a 2.0 x 12-mm apex balloon which was taken to 8 atmospheres.  The vessel was then stented back to the origin.  Just beyond the bifurcation point of the PDA, a 2.5 x 12-mm Promus element drug-eluting stent platform was chosen.  The stent was  deployed at 12 atmospheres and appeared well expanded.  The stent was postdilated with a 2.75 x 8-mm Robbins Quantum apex balloon which was taken to 16 atmospheres for 2 inflations.  There was an excellent angiographic result with 0% residual stenosis and TIMI III flow.  FINAL CONCLUSIONS: 1. Continued patency at the LAD stent site with patency of the     diagonal branch, PTCA site. 2. Successful stenting of the right posterior AV segment branch using     a drug-eluting stent platform.     Veverly Fells. Excell Seltzer, MD    MDC/MEDQ  D:  04/22/2011  T:  04/23/2011  Job:  [id number]  cc:   Warrick Parisian, MD  Electronically Signed by Tonny Bollman MD on 05/29/2011 07:32:50 AM

## 2011-05-29 NOTE — Cardiovascular Report (Signed)
NAME:  Keith Morrow, Keith Morrow NO.:  [id number]  MEDICAL RECORD NO.:  [id number]           PATIENT TYPE:  I  LOCATION:  6525                         FACILITY:  [facility]  PHYSICIAN:  Veverly Fells. Excell Seltzer, MD  DATE OF BIRTH:  [birth year]  DATE OF PROCEDURE: DATE OF DISCHARGE:                           CARDIAC CATHETERIZATION   PROCEDURES: 1. Left heart catheterization. 2. Selective coronary angiography. 3. Left ventricular angiography. 4. PTCA and stenting of the proximal LAD. 5. PTCA of the first diagonal.  PROCEDURAL INDICATIONS:  [name] is a 40 year old gentleman with a history of multiple sclerosis.  He presented with classic acute coronary syndrome with marked anterior T-wave changes.  He was treated with heparin, aspirin, and Plavix and became chest pain free.  He was referred for cardiac catheterization.  Risks and indications of procedure were reviewed with the patient. Informed consent was obtained.  The right wrist was prepped and draped and anesthetized with 1% lidocaine.  Using the modified Seldinger technique, a 6-French sheath was placed in the right radial artery.  A 3 mg of verapamil was administered through the sheath.  A 5000 units of unfractionated heparin was given intravenously.  A TIG catheter was used for both left and right coronary angiography.  A pigtail catheter was used for left ventriculography.  After the diagnostic procedure, I elected to proceed with PCI.  There were no complications from the diagnostic procedure.  PROCEDURAL FINDINGS:  Aortic pressure 107/76 with mean of 92, left ventricular pressure 108/19. Left mainstem.  The left main is widely patent.  It trifurcates into the LAD, intermediate branch, and left circumflex. LAD.  The LAD is of large caliber.  There is diffuse irregularity.  The mid LAD has a critical 90-95% stenosis involving the first diagonal branch which is also severely diseased.  The diagonal has 90%  ostial stenosis.  The mid distal LAD has diffuse nonobstructive plaque without high-grade disease. Left circumflex.  There is a large ramus intermedius with mild nonobstructive disease proximally.  The AV groove circumflex supplies a large first OM with mild disease but no high-grade stenosis.  The continuation of the AV groove circumflex has a 50% stenosis at the trifurcation point of the circumflex.  It courses down and gives off a small posterolateral branch. RCA.  The RCA is a large, dominant vessel.  There is proximal ectasia and 30% mid stenosis.  There are diffuse luminal irregularities of the mid and distal RCA.  It divides into a PDA branch and posterolateral branch.  The PDA is patent without significant stenosis.  The posterolateral branch has an 80-90% focal stenosis. Left ventriculography shows normal LV function.  The ejection fraction is 55%.  PCI NOTE:  Attention was turned to the LAD which was clearly the patient's culprit for his acute coronary syndrome.  A 6-French [place] left 3.5-cm guide catheter was used for the interventional procedure. Bivalirudin was used for anticoagulation.  The patient had been preloaded with Plavix.  Once the therapeutic ACT was achieved, a Cougar guidewire was advanced into the apical LAD and the vessel was predilated with a 2.5 x 15 mm  balloon which was taken to 10 atmospheres.  Two inflations were done.  There was improvement in the LAD lesion but there was marked compromise of the diagonal.  I elected to wire the diagonal with a Cougar guidewire.  This was done without difficulty.  The LAD was then stented with a 4.0 x 16 mm Promus drug-eluting stent.  The stent was deployed at 12 atmospheres and appeared well expanded.  The diagonal was totally occluded after stenting.  The LAD result was excellent.  The patient began to have some chest discomfort, so I elected to try to rewire the diagonal in order to reopen it.  A Whisper wire was  attempted but it was unsuccessful.  I then pulled the indwelling Cougar guidewire out of the diagonal branch and the branch regained flow, albeit TIMI 2. There was residual high-grade ostial stenosis.  I made a further attempt with a Whisper wire but could not enter the diagonal.  I took a new Cougar wire and was able to, with a moderate amount difficulty, get back into the diagonal branch and the diagonal was dilated with a 2.0 x 12 mm Apex balloon.  This restored TIMI 3 flow in the diagonal.  Kissing balloons were then performed at the LAD diagonal bifurcation with a 4.0 x 15 mm [place] balloon in the LAD entirely within the stent and a 2.0 x 12 mm Apex balloon in the diagonal.  The diagonal balloon was inflated to 6 atmospheres.  The LAD balloon was inflated to 14 atmospheres.  Two inflations were done in the LAD.  There was an excellent result with wide patency, 0% residual stenosis, and TIMI-3 flow in the LAD.  The diagonal had persistent ostial compromise of about 70-80%, but there was TIMI-3 flow and the patient had resolution of his chest discomfort. Final angiography was performed and the guide catheter and wires were removed.  The patient tolerated the procedure well.  There were no immediate complications.  ASSESSMENT: 1. Critical proximal-to-mid left anterior descending coronary artery     stenosis involving the left anterior descending coronary artery     diagonal bifurcation.  Successful bifurcation percutaneous coronary     intervention was performed with a drug-eluting stent in the left     anterior descending coronary artery and balloon angioplasty of the     diagonal branch. 2. Severe posterolateral branch stenosis. 3. Nonobstructive left circumflex stenosis. 4. Normal left ventricular function.  DISCUSSION:  The patient will need aggressive medical therapy.  He has diffuse coronary artery disease.  We will change his Plavix to Effient as I think he is at high risk  of recurrent events.  We will plan on staged PCI of the posterolateral branch, likely tomorrow.     Veverly Fells. Excell Seltzer, MD     MDC/MEDQ  D:  04/21/2011  T:  04/22/2011  Job:  [id number]  Electronically Signed by Tonny Bollman MD on 05/29/2011 07:32:46 AM

## 2011-06-02 ENCOUNTER — Encounter (HOSPITAL_COMMUNITY): Payer: Managed Care, Other (non HMO)

## 2011-06-04 ENCOUNTER — Encounter (HOSPITAL_COMMUNITY): Payer: Managed Care, Other (non HMO)

## 2011-06-06 ENCOUNTER — Encounter (HOSPITAL_COMMUNITY)
Admission: RE | Admit: 2011-06-06 | Discharge: 2011-06-06 | Disposition: A | Discharge status: Home / Self Care | Payer: Managed Care, Other (non HMO) | Source: Ambulatory Visit | Attending: Cardiovascular Disease | Admitting: Cardiovascular Disease

## 2011-06-06 DIAGNOSIS — Z7982 Long term (current) use of aspirin: Secondary | ICD-10-CM | POA: Insufficient documentation

## 2011-06-06 DIAGNOSIS — Z9861 Coronary angioplasty status: Secondary | ICD-10-CM | POA: Insufficient documentation

## 2011-06-06 DIAGNOSIS — I214 Non-ST elevation (NSTEMI) myocardial infarction: Secondary | ICD-10-CM | POA: Insufficient documentation

## 2011-06-06 DIAGNOSIS — E039 Hypothyroidism, unspecified: Secondary | ICD-10-CM | POA: Insufficient documentation

## 2011-06-06 DIAGNOSIS — Z5189 Encounter for other specified aftercare: Secondary | ICD-10-CM | POA: Insufficient documentation

## 2011-06-06 DIAGNOSIS — G35 Multiple sclerosis: Secondary | ICD-10-CM | POA: Insufficient documentation

## 2011-06-06 DIAGNOSIS — E785 Hyperlipidemia, unspecified: Secondary | ICD-10-CM | POA: Insufficient documentation

## 2011-06-06 DIAGNOSIS — I2510 Atherosclerotic heart disease of native coronary artery without angina pectoris: Secondary | ICD-10-CM | POA: Insufficient documentation

## 2011-06-06 DIAGNOSIS — I10 Essential (primary) hypertension: Secondary | ICD-10-CM | POA: Insufficient documentation

## 2011-06-09 ENCOUNTER — Encounter (HOSPITAL_COMMUNITY): Payer: Managed Care, Other (non HMO) | Attending: Cardiovascular Disease

## 2011-06-09 DIAGNOSIS — Z5189 Encounter for other specified aftercare: Secondary | ICD-10-CM | POA: Insufficient documentation

## 2011-06-09 DIAGNOSIS — E785 Hyperlipidemia, unspecified: Secondary | ICD-10-CM | POA: Insufficient documentation

## 2011-06-09 DIAGNOSIS — G35 Multiple sclerosis: Secondary | ICD-10-CM | POA: Insufficient documentation

## 2011-06-09 DIAGNOSIS — Z7982 Long term (current) use of aspirin: Secondary | ICD-10-CM | POA: Insufficient documentation

## 2011-06-09 DIAGNOSIS — Z9861 Coronary angioplasty status: Secondary | ICD-10-CM | POA: Insufficient documentation

## 2011-06-09 DIAGNOSIS — I2510 Atherosclerotic heart disease of native coronary artery without angina pectoris: Secondary | ICD-10-CM | POA: Insufficient documentation

## 2011-06-09 DIAGNOSIS — E039 Hypothyroidism, unspecified: Secondary | ICD-10-CM | POA: Insufficient documentation

## 2011-06-09 DIAGNOSIS — I10 Essential (primary) hypertension: Secondary | ICD-10-CM | POA: Insufficient documentation

## 2011-06-09 DIAGNOSIS — I214 Non-ST elevation (NSTEMI) myocardial infarction: Secondary | ICD-10-CM | POA: Insufficient documentation

## 2011-06-11 ENCOUNTER — Encounter (HOSPITAL_COMMUNITY): Payer: Managed Care, Other (non HMO)

## 2011-06-13 ENCOUNTER — Encounter (HOSPITAL_COMMUNITY): Payer: Managed Care, Other (non HMO)

## 2011-06-16 ENCOUNTER — Encounter (HOSPITAL_COMMUNITY): Payer: Managed Care, Other (non HMO)

## 2011-06-18 ENCOUNTER — Encounter (HOSPITAL_COMMUNITY): Payer: Managed Care, Other (non HMO)

## 2011-06-20 ENCOUNTER — Encounter (HOSPITAL_COMMUNITY): Payer: Managed Care, Other (non HMO)

## 2011-06-23 ENCOUNTER — Encounter (HOSPITAL_COMMUNITY): Payer: Managed Care, Other (non HMO)

## 2011-06-25 ENCOUNTER — Encounter (HOSPITAL_COMMUNITY): Payer: Managed Care, Other (non HMO)

## 2011-06-27 ENCOUNTER — Encounter (HOSPITAL_COMMUNITY): Payer: Managed Care, Other (non HMO)

## 2011-06-30 ENCOUNTER — Encounter (HOSPITAL_COMMUNITY): Payer: Managed Care, Other (non HMO)

## 2011-07-02 ENCOUNTER — Encounter (HOSPITAL_COMMUNITY): Payer: Managed Care, Other (non HMO)

## 2011-07-04 ENCOUNTER — Encounter (HOSPITAL_COMMUNITY): Payer: Managed Care, Other (non HMO)

## 2011-07-07 ENCOUNTER — Encounter (HOSPITAL_COMMUNITY): Payer: Managed Care, Other (non HMO)

## 2011-07-09 ENCOUNTER — Encounter (HOSPITAL_COMMUNITY): Payer: Managed Care, Other (non HMO)

## 2011-07-11 ENCOUNTER — Encounter (HOSPITAL_COMMUNITY): Payer: Managed Care, Other (non HMO) | Attending: Cardiovascular Disease

## 2011-07-11 DIAGNOSIS — Z5189 Encounter for other specified aftercare: Secondary | ICD-10-CM | POA: Insufficient documentation

## 2011-07-11 DIAGNOSIS — G35 Multiple sclerosis: Secondary | ICD-10-CM | POA: Insufficient documentation

## 2011-07-11 DIAGNOSIS — I10 Essential (primary) hypertension: Secondary | ICD-10-CM | POA: Insufficient documentation

## 2011-07-11 DIAGNOSIS — E039 Hypothyroidism, unspecified: Secondary | ICD-10-CM | POA: Insufficient documentation

## 2011-07-11 DIAGNOSIS — I214 Non-ST elevation (NSTEMI) myocardial infarction: Secondary | ICD-10-CM | POA: Insufficient documentation

## 2011-07-11 DIAGNOSIS — Z7982 Long term (current) use of aspirin: Secondary | ICD-10-CM | POA: Insufficient documentation

## 2011-07-11 DIAGNOSIS — I2510 Atherosclerotic heart disease of native coronary artery without angina pectoris: Secondary | ICD-10-CM | POA: Insufficient documentation

## 2011-07-11 DIAGNOSIS — Z9861 Coronary angioplasty status: Secondary | ICD-10-CM | POA: Insufficient documentation

## 2011-07-11 DIAGNOSIS — E785 Hyperlipidemia, unspecified: Secondary | ICD-10-CM | POA: Insufficient documentation

## 2011-07-14 ENCOUNTER — Encounter (HOSPITAL_COMMUNITY): Payer: Managed Care, Other (non HMO)

## 2011-07-16 ENCOUNTER — Encounter (HOSPITAL_COMMUNITY): Payer: Managed Care, Other (non HMO)

## 2011-07-17 ENCOUNTER — Ambulatory Visit (INDEPENDENT_AMBULATORY_CARE_PROVIDER_SITE_OTHER): Payer: Managed Care, Other (non HMO) | Admitting: Cardiovascular Disease

## 2011-07-17 ENCOUNTER — Encounter: Payer: Self-pay | Admitting: Cardiovascular Disease

## 2011-07-17 DIAGNOSIS — I2510 Atherosclerotic heart disease of native coronary artery without angina pectoris: Secondary | ICD-10-CM

## 2011-07-17 DIAGNOSIS — E785 Hyperlipidemia, unspecified: Secondary | ICD-10-CM

## 2011-07-17 NOTE — Patient Instructions (Signed)
Your physician wants you to follow-up in: 6 months You will receive a reminder letter in the mail two months in advance. If you don't receive a letter, please call our office to schedule the follow-up appointment.  Have fasting lab work done. You have a prescription for this.

## 2011-07-18 ENCOUNTER — Encounter (HOSPITAL_COMMUNITY): Payer: Managed Care, Other (non HMO)

## 2011-07-19 ENCOUNTER — Encounter: Payer: Self-pay | Admitting: Cardiovascular Disease

## 2011-07-19 NOTE — Progress Notes (Signed)
HPI:  This is a 40 year old gentleman presenting for followup evaluation. The patient has coronary artery disease and presented with a non-ST elevation infarction and May 2012. He had complex disease of his LAD/diagonal bifurcation and was treated with PCI using a drug-eluting stent in the LAD and balloon angioplasty of the diagonal. He underwent staged PCI of high-grade stenosis of the right coronary artery. The patient has done very well. He is limited by multiple sclerosis. He denies chest pain, dyspnea, edema, or palpitations. He continues to participate in cardiac rehabilitation.  Outpatient Encounter Prescriptions as of 07/17/2011  Medication Sig Dispense Refill  . aspirin 81 MG tablet Take 81 mg by mouth daily.        Marland Kitchen atorvastatin (LIPITOR) 40 MG tablet Take 40 mg by mouth daily.        . baclofen (LIORESAL) 10 MG tablet Take 10 mg by mouth as needed.        . cetirizine (ZYRTEC) 10 MG tablet Take 10 mg by mouth as needed.        . Dalfampridine (AMPYRA) 10 MG TB12 Take by mouth. Every 12 hours       . DULoxetine (CYMBALTA) 60 MG capsule Take 60 mg by mouth 2 (two) times daily.        Marland Kitchen levothyroxine (SYNTHROID, LEVOTHROID) 150 MCG tablet Take 150 mcg by mouth daily.        Marland Kitchen LORazepam (ATIVAN) 1 MG tablet Take 1 mg by mouth every 6 (six) hours as needed.        . metoprolol (LOPRESSOR) 50 MG tablet Take 25 mg by mouth 2 (two) times daily.       Marland Kitchen olmesartan (BENICAR) 20 MG tablet Take 20 mg by mouth daily.        Marland Kitchen omeprazole (PRILOSEC) 20 MG capsule Take 20 mg by mouth daily.        . prasugrel (EFFIENT) 10 MG TABS Take by mouth daily.          No Known Allergies  Past Medical History  Diagnosis Date  . HTN (hypertension)   . Hypothyroidism   . Hyperlipidemia   . MS (multiple sclerosis)   . Coronary atherosclerosis of native coronary artery     a. NSTEMI 04/20/11: tx with Promus DES to LAD; bifurcation lesion with oDx 90% tx with POBA; b. staged PCI of right post. AV Branch with  Promus DES; c. residual at cath 04/21/11:  AV groove CFX 50%, mRCA 30%,; EF 55%    ROS: Negative except as per HPI  BP 106/64  Pulse 66  Resp 16  Ht 6\' 3"  (1.905 m)  Wt 284 lb (128.822 kg)  BMI 35.50 kg/m2  PHYSICAL EXAM: Pt is alert and oriented, overweight male in NAD HEENT: normal Neck: JVP - normal, carotids 2+= without bruits Lungs: CTA bilaterally CV: RRR without murmur or gallop Abd: soft, NT, Positive BS, no hepatomegaly Ext: no C/C/E, distal pulses intact and equal Skin: warm/dry no rash  ASSESSMENT AND PLAN:

## 2011-07-19 NOTE — Assessment & Plan Note (Signed)
Lipids were at goal when last checked in May. We'll followup prior to his next visit in 6 months. He is appropriately on statin therapy with Lipitor.

## 2011-07-19 NOTE — Assessment & Plan Note (Signed)
The The patient is stable without angina. He has preserved LV function. We'll continue his current medical program which includes aspirin and effient for antiplatelet therapy along with aggressive secondary risk reduction measures.

## 2011-07-21 ENCOUNTER — Encounter (HOSPITAL_COMMUNITY): Payer: Managed Care, Other (non HMO)

## 2011-07-23 ENCOUNTER — Encounter (HOSPITAL_COMMUNITY): Payer: Managed Care, Other (non HMO)

## 2011-07-25 ENCOUNTER — Encounter (HOSPITAL_COMMUNITY): Payer: Managed Care, Other (non HMO)

## 2011-07-28 ENCOUNTER — Encounter (HOSPITAL_COMMUNITY): Payer: Managed Care, Other (non HMO)

## 2011-07-30 ENCOUNTER — Encounter (HOSPITAL_COMMUNITY): Payer: Managed Care, Other (non HMO)

## 2011-08-01 ENCOUNTER — Encounter (HOSPITAL_COMMUNITY): Payer: Managed Care, Other (non HMO)

## 2011-08-04 ENCOUNTER — Encounter (HOSPITAL_COMMUNITY): Payer: Managed Care, Other (non HMO)

## 2011-08-06 ENCOUNTER — Encounter (HOSPITAL_COMMUNITY): Payer: Managed Care, Other (non HMO)

## 2011-08-08 ENCOUNTER — Encounter (HOSPITAL_COMMUNITY): Payer: Managed Care, Other (non HMO)

## 2011-08-11 ENCOUNTER — Encounter (HOSPITAL_COMMUNITY): Payer: Managed Care, Other (non HMO)

## 2011-08-13 ENCOUNTER — Encounter (HOSPITAL_COMMUNITY): Payer: Managed Care, Other (non HMO) | Attending: Cardiovascular Disease

## 2011-08-13 DIAGNOSIS — Z5189 Encounter for other specified aftercare: Secondary | ICD-10-CM | POA: Insufficient documentation

## 2011-08-13 DIAGNOSIS — G35 Multiple sclerosis: Secondary | ICD-10-CM | POA: Insufficient documentation

## 2011-08-13 DIAGNOSIS — E039 Hypothyroidism, unspecified: Secondary | ICD-10-CM | POA: Insufficient documentation

## 2011-08-13 DIAGNOSIS — Z7982 Long term (current) use of aspirin: Secondary | ICD-10-CM | POA: Insufficient documentation

## 2011-08-13 DIAGNOSIS — I2510 Atherosclerotic heart disease of native coronary artery without angina pectoris: Secondary | ICD-10-CM | POA: Insufficient documentation

## 2011-08-13 DIAGNOSIS — Z9861 Coronary angioplasty status: Secondary | ICD-10-CM | POA: Insufficient documentation

## 2011-08-13 DIAGNOSIS — E785 Hyperlipidemia, unspecified: Secondary | ICD-10-CM | POA: Insufficient documentation

## 2011-08-13 DIAGNOSIS — I214 Non-ST elevation (NSTEMI) myocardial infarction: Secondary | ICD-10-CM | POA: Insufficient documentation

## 2011-08-13 DIAGNOSIS — I10 Essential (primary) hypertension: Secondary | ICD-10-CM | POA: Insufficient documentation

## 2011-08-15 ENCOUNTER — Encounter (HOSPITAL_COMMUNITY): Payer: Managed Care, Other (non HMO)

## 2011-08-18 ENCOUNTER — Encounter (HOSPITAL_COMMUNITY): Payer: Managed Care, Other (non HMO)

## 2011-08-20 ENCOUNTER — Encounter (HOSPITAL_COMMUNITY): Payer: Managed Care, Other (non HMO)

## 2011-08-22 ENCOUNTER — Encounter (HOSPITAL_COMMUNITY): Payer: Managed Care, Other (non HMO)

## 2011-08-25 ENCOUNTER — Encounter (HOSPITAL_COMMUNITY): Payer: Managed Care, Other (non HMO)

## 2011-08-27 ENCOUNTER — Encounter (HOSPITAL_COMMUNITY): Payer: Managed Care, Other (non HMO)

## 2011-08-29 ENCOUNTER — Encounter (HOSPITAL_COMMUNITY): Payer: Managed Care, Other (non HMO)

## 2011-09-01 ENCOUNTER — Encounter (HOSPITAL_COMMUNITY): Payer: Managed Care, Other (non HMO)

## 2011-09-03 ENCOUNTER — Encounter (HOSPITAL_COMMUNITY): Payer: Managed Care, Other (non HMO)

## 2011-09-05 ENCOUNTER — Encounter (HOSPITAL_COMMUNITY): Payer: Managed Care, Other (non HMO)

## 2011-09-08 ENCOUNTER — Encounter (HOSPITAL_COMMUNITY): Payer: Managed Care, Other (non HMO) | Attending: Cardiovascular Disease

## 2011-09-08 DIAGNOSIS — I2510 Atherosclerotic heart disease of native coronary artery without angina pectoris: Secondary | ICD-10-CM | POA: Insufficient documentation

## 2011-09-08 DIAGNOSIS — I10 Essential (primary) hypertension: Secondary | ICD-10-CM | POA: Insufficient documentation

## 2011-09-08 DIAGNOSIS — Z5189 Encounter for other specified aftercare: Secondary | ICD-10-CM | POA: Insufficient documentation

## 2011-09-08 DIAGNOSIS — E039 Hypothyroidism, unspecified: Secondary | ICD-10-CM | POA: Insufficient documentation

## 2011-09-08 DIAGNOSIS — Z7982 Long term (current) use of aspirin: Secondary | ICD-10-CM | POA: Insufficient documentation

## 2011-09-08 DIAGNOSIS — Z9861 Coronary angioplasty status: Secondary | ICD-10-CM | POA: Insufficient documentation

## 2011-09-08 DIAGNOSIS — E785 Hyperlipidemia, unspecified: Secondary | ICD-10-CM | POA: Insufficient documentation

## 2011-09-08 DIAGNOSIS — I214 Non-ST elevation (NSTEMI) myocardial infarction: Secondary | ICD-10-CM | POA: Insufficient documentation

## 2011-09-08 DIAGNOSIS — G35 Multiple sclerosis: Secondary | ICD-10-CM | POA: Insufficient documentation

## 2011-09-10 ENCOUNTER — Encounter (HOSPITAL_COMMUNITY): Payer: Managed Care, Other (non HMO)

## 2011-09-12 ENCOUNTER — Encounter (HOSPITAL_COMMUNITY): Payer: Managed Care, Other (non HMO)

## 2011-09-15 ENCOUNTER — Encounter (HOSPITAL_COMMUNITY): Payer: Managed Care, Other (non HMO)

## 2011-09-17 ENCOUNTER — Encounter (HOSPITAL_COMMUNITY): Payer: Managed Care, Other (non HMO)

## 2011-09-19 ENCOUNTER — Encounter (HOSPITAL_COMMUNITY): Payer: Managed Care, Other (non HMO)

## 2011-09-22 ENCOUNTER — Encounter (HOSPITAL_COMMUNITY): Payer: Managed Care, Other (non HMO)

## 2011-09-24 ENCOUNTER — Encounter (HOSPITAL_COMMUNITY): Payer: Managed Care, Other (non HMO)

## 2011-09-26 ENCOUNTER — Encounter (HOSPITAL_COMMUNITY): Payer: Managed Care, Other (non HMO)

## 2011-09-29 ENCOUNTER — Encounter (HOSPITAL_COMMUNITY): Payer: Managed Care, Other (non HMO)

## 2011-10-01 ENCOUNTER — Encounter (HOSPITAL_COMMUNITY): Payer: Managed Care, Other (non HMO)

## 2011-10-03 ENCOUNTER — Encounter (HOSPITAL_COMMUNITY): Payer: Managed Care, Other (non HMO)

## 2011-10-06 ENCOUNTER — Encounter: Payer: Self-pay | Admitting: Family Medicine

## 2011-10-06 ENCOUNTER — Ambulatory Visit (INDEPENDENT_AMBULATORY_CARE_PROVIDER_SITE_OTHER): Payer: Managed Care, Other (non HMO) | Admitting: Family Medicine

## 2011-10-06 VITALS — BP 111/74 | HR 72 | Temp 97.8°F | Ht 76.0 in | Wt 295.8 lb

## 2011-10-06 DIAGNOSIS — G35 Multiple sclerosis: Secondary | ICD-10-CM

## 2011-10-06 DIAGNOSIS — F3289 Other specified depressive episodes: Secondary | ICD-10-CM

## 2011-10-06 DIAGNOSIS — Z23 Encounter for immunization: Secondary | ICD-10-CM

## 2011-10-06 DIAGNOSIS — E785 Hyperlipidemia, unspecified: Secondary | ICD-10-CM

## 2011-10-06 DIAGNOSIS — B351 Tinea unguium: Secondary | ICD-10-CM

## 2011-10-06 DIAGNOSIS — L910 Hypertrophic scar: Secondary | ICD-10-CM

## 2011-10-06 DIAGNOSIS — E039 Hypothyroidism, unspecified: Secondary | ICD-10-CM

## 2011-10-06 DIAGNOSIS — E669 Obesity, unspecified: Secondary | ICD-10-CM

## 2011-10-06 DIAGNOSIS — Z0000 Encounter for general adult medical examination without abnormal findings: Secondary | ICD-10-CM

## 2011-10-06 DIAGNOSIS — B353 Tinea pedis: Secondary | ICD-10-CM

## 2011-10-06 DIAGNOSIS — I10 Essential (primary) hypertension: Secondary | ICD-10-CM

## 2011-10-06 DIAGNOSIS — I2510 Atherosclerotic heart disease of native coronary artery without angina pectoris: Secondary | ICD-10-CM

## 2011-10-06 DIAGNOSIS — K219 Gastro-esophageal reflux disease without esophagitis: Secondary | ICD-10-CM

## 2011-10-06 DIAGNOSIS — F329 Major depressive disorder, single episode, unspecified: Secondary | ICD-10-CM

## 2011-10-06 MED ORDER — CLOTRIMAZOLE-BETAMETHASONE 1-0.05 % EX CREA: TOPICAL_CREAM | CUTANEOUS | Status: DC

## 2011-10-06 NOTE — Patient Instructions (Signed)
Preventative Care for Adults, Male A healthy lifestyle and preventative care can promote health and wellness. Preventative health guidelines for men include the following key practices:  A routine yearly physical is a good way to check with your caregiver about your health and preventative screening. It is a chance to share any concerns and updates on your health, and to receive a thorough exam.   Visit your dentist for a routine exam and preventative care every 6 months. Brush your teeth twice a day and floss once a day. Good oral hygiene prevents tooth decay and gum disease.   The frequency of eye exams is based on your age, health, family medical history, use of contact lenses, and other factors. Follow your caregiver's recommendations for frequency of eye exams.   Eat a healthy diet. Foods like vegetables, fruits, whole grains, low-fat dairy products, and lean protein foods contain the nutrients you need without too many calories. Decrease your intake of foods high in solid fats, added sugars, and salt. Eat the right amount of calories for you.Get information about a proper diet from your caregiver, if necessary.   Regular physical exercise is one of the most important things you can do for your health. Most adults should get at least 150 minutes of moderate-intensity exercise (any activity that increases your heart rate and causes you to sweat) each week. In addition, most adults need muscle-strengthening exercises on 2 or more days a week.   Maintain a healthy weight. The body mass index (BMI) is a screening tool to identify possible weight problems. It provides an estimate of body fat based on height and weight. Your caregiver can help determine your BMI, and can help you achieve or maintain a healthy weight.For adults 20 years and older:   A BMI below 18.5 is considered underweight.   A BMI of 18.5 to 24.9 is normal.   A BMI of 25 to 29.9 is considered overweight.   A BMI of 30 and  above is considered obese.   Maintain normal blood lipids and cholesterol levels by exercising and minimizing your intake of saturated fat. Eat a balanced diet with plenty of fruit and vegetables. Blood tests for lipids and cholesterol should begin at age 2 and be repeated every 5 years. If your lipid or cholesterol levels are high, you are over 50, or you are a high risk for heart disease, you may need your cholesterol levels checked more frequently.Ongoing high lipid and cholesterol levels should be treated with medicines if diet and exercise are not effective.   If you smoke, find out from your caregiver how to quit. If you do not use tobacco, do not start.   If you choose to drink alcohol, do not exceed 2 drinks per day. One drink is considered to be 12 ounces (355 mL) of beer, 5 ounces (148 mL) of wine, or 1.5 ounces (44 mL) of liquor.   Avoid use of street drugs. Do not share needles with anyone. Ask for help if you need support or instructions about stopping the use of drugs.   High blood pressure causes heart disease and increases the risk of stroke. Your blood pressure should be checked at least every 1 to 2 years. Ongoing high blood pressure should be treated with medicines, if weight loss and exercise are not effective.   If you are 20 to 38 years old, ask your caregiver if you should take aspirin to prevent heart disease.   Diabetes screening involves taking a blood  sample to check your fasting blood sugar level. This should be done once every 3 years, after age 26, if you are within normal weight and without risk factors for diabetes. Testing should be considered at a younger age or be carried out more frequently if you are overweight and have at least 1 risk factor for diabetes.   Colorectal cancer can be detected and often prevented. Most routine colorectal cancer screening begins at the age of 12 and continues through age 81. However, your caregiver may recommend screening at an  earlier age if you have risk factors for colon cancer. On a yearly basis, your caregiver may provide home test kits to check for hidden blood in the stool. Use of a small camera at the end of a tube, to directly examine the colon (sigmoidoscopy or colonoscopy), can detect the earliest forms of colorectal cancer. Talk to your caregiver about this at age 73, when routine screening begins. Direct examination of the colon should be repeated every 5 to 10 years through age 42, unless early forms of pre-cancerous polyps or small growths are found.   Practice safe sex. Use condoms and avoid high-risk sexual practices to reduce the spread of sexually transmitted infections (STIs). STIs include gonorrhea, chlamydia, syphilis, trichomonas, herpes, HPV, and human immunodeficiency virus (HIV). Herpes, HIV, and HPV are viral illnesses that have no cure. They can result in disability, cancer, and death.   A one-time screening for abdominal aortic aneurysm (AAA) and surgical repair of large AAAs by sound wave imaging (ultrasonography) is recommended for ages 39 to 62 years who are current or former smokers.   Healthy men should no longer receive prostate-specific antigen (PSA) blood tests as part of routine cancer screening. Consult with your caregiver about prostate cancer screening.   Use sunscreen with skin protection factor (SPF) of 30 or more. Apply sunscreen liberally and repeatedly throughout the day. You should seek shade when your shadow is shorter than you. Protect yourself by wearing long sleeves, pants, a wide-brimmed hat, and sunglasses year round, whenever you are outdoors.   Once a month, do a whole body skin exam, using a mirror to look at the skin on your back. Notify your caregiver of new moles, moles that have irregular borders, moles that are larger than a pencil eraser, or moles that have changed in shape or color.   Stay current with required immunizations.   Influenza. You need a dose every  fall (or winter). The composition of the flu vaccine changes each year, so being vaccinated once is not enough.   Pneumococcal polysaccharide. You need 1 to 2 doses if you smoke cigarettes or if you have certain chronic medical conditions. You need 1 dose at age 55 (or older) if you have never been vaccinated.   Tetanus, diphtheria, pertussis (Tdap, Td). Get 1 dose of Tdap vaccine if you are younger than age 28 years, are over 40 and have contact with an infant, are a Research scientist (physical sciences), or simply want to be protected from whooping cough. After that, you need a Td booster dose every 10 years. Consult your caregiver if you have not had at least 3 tetanus and diphtheria-containing shots sometime in your life or have a deep or dirty wound.   HPV. This vaccine is recommended for males 13 through 38 years of age. This vaccine may be given to men 22 through 38 years of age who have not completed the 3 dose series. It is recommended for men through age 8  who have sex with men or whose immune system is weakened because of HIV infection, other illness, or medications. The vaccine is given in 3 doses over 6 months.   Measles, mumps, rubella (MMR). You need at least 1 dose of MMR if you were born in 1957 or later. You may also need a 2nd dose.   Meningococcal. If you are age 56 to 22 years and a Orthoptist living in a residence hall, or have one of several medical conditions, you need to get vaccinated against meningococcal disease. You may also need additional booster doses.   Zoster (shingles). If you are age 72 years or older, you should get this vaccine.   Varicella (chickenpox). If you have never had chickenpox or you were vaccinated but received only 1 dose, talk to your caregiver to find out if you need this vaccine.   Hepatitis A. You need this vaccine if you have a specific risk factor for hepatitis A virus infection, or you simply wish to be protected from this disease. The vaccine is  usually given as 2 doses, 6 to 18 months apart.   Hepatitis B. You need this vaccine if you have a specific risk factor for hepatitis B virus infection or you simply wish to be protected from this disease. The vaccine is given in 3 doses, usually over 6 months.  Preventative Service / Frequency Ages 45 to 5  Blood pressure check.** / Every 1 to 2 years.   Lipid and cholesterol check.**/ Every 5 years beginning at age 26.   Skin self-exam. / Monthly.   Influenza immunization.** / Every year.   Pneumococcal polysaccharide immunization.** / 1 to 2 doses if you smoke cigarettes or if you have certain chronic medical conditions.   Tetanus, diphtheria, pertussis (Tdap,Td) immunization. / A one-time dose of Tdap vaccine. After that, you need a Td booster dose every 10 years.   HPV immunization. / 3 doses over 6 months, if 26 and younger.   Measles, mumps, rubella (MMR) immunization. / You need at least 1 dose of MMR if you were born in 1957 or later. You may also need a 2nd dose.   Meningococcal immunization. / 1 dose if you are age 39 to 76 years and a Orthoptist living in a residence hall, or have one of several medical conditions, you need to get vaccinated against meningococcal disease. You may also need additional booster doses.   Varicella immunization. **/ Consult your caregiver.   Hepatitis A immunization. ** / Consult your caregiver. 2 doses, 6 to 18 months apart.   Hepatitis B immunization.** / Consult your caregiver. 3 doses usually over 6 months.  Ages 54 to 49  Blood pressure check.** / Every 1 to 2 years.   Lipid and cholesterol check.**/ Every 5 years beginning at age 78.   Fecal occult blood test (FOBT) of stool. / Every year beginning at age 56 and continuing until age 65. You may not have to do this test if you get colonoscopy every 10 years.   Flexible sigmoidoscopy** or colonoscopy.** / Every 5 years for a flexible sigmoidoscopy or every 10 years for  a colonoscopy beginning at age 23 and continuing until age 78.   Skin self-exam. / Monthly.   Influenza immunization.** / Every year.   Pneumococcal polysaccharide immunization.** / 1 to 2 doses if you smoke cigarettes or if you have certain chronic medical conditions.   Tetanus, diphtheria, pertussis (Tdap/Td) immunization.** / A one-time dose of  Tdap vaccine. After that, you need a Td booster dose every 10 years.   Measles, mumps, rubella (MMR) immunization. / You need at least 1 dose of MMR if you were born in 1957 or later. You may also need a 2nd dose.   Varicella immunization. **/ Consult your caregiver.   Meningococcal immunization.** / Consult your caregiver.   Hepatitis A immunization. ** / Consult your caregiver. 2 doses, 6 to 18 months apart.   Hepatitis B immunization.** / Consult your caregiver. 3 doses, usually over 6 months.  Ages 57 and over  Blood pressure check.** / Every 1 to 2 years.   Lipid and cholesterol check.**/ Every 5 years beginning at age 25.   Fecal occult blood test (FOBT) of stool. / Every year beginning at age 4 and continuing until age 88. You may not have to do this test if you get colonoscopy every 10 years.   Flexible sigmoidoscopy** or colonoscopy.** / Every 5 years for a flexible sigmoidoscopy or every 10 years for a colonoscopy beginning at age 49 and continuing until age 73.   Abdominal aortic aneurysm (AAA) screening.** / A one-time screening for ages 49 to 43 years who are current or former smokers.   Skin self-exam. / Monthly.   Influenza immunization.** / Every year.   Pneumococcal polysaccharide immunization.** / 1 dose at age 13 (or older) if you have never been vaccinated.   Tetanus, diphtheria, pertussis (Tdap, Td) immunization. / A one-time dose of Tdap vaccine if you are over 65 and have contact with an infant, are a Research scientist (physical sciences), or simply want to be protected from whooping cough. After that, you need a Td booster dose  every 10 years.   Varicella immunization. **/ Consult your caregiver.   Meningococcal immunization.** / Consult your caregiver.   Hepatitis A immunization. ** / Consult your caregiver. 2 doses, 6 to 18 months apart.   Hepatitis B immunization.** / Check with your caregiver. 3 doses, usually over 6 months.  **Family history and personal history of risk and conditions may change your caregiver's recommendations. Document Released: 01/20/2002 Document Revised: 08/06/2011 Document Reviewed: 04/21/2011 [facility] Patient Information 2012 [place], [place].    White Vinegar soaks at bed and vick's vapor rub

## 2011-10-07 ENCOUNTER — Encounter: Payer: Self-pay | Admitting: Family Medicine

## 2011-10-07 DIAGNOSIS — L910 Hypertrophic scar: Secondary | ICD-10-CM | POA: Insufficient documentation

## 2011-10-07 DIAGNOSIS — E669 Obesity, unspecified: Secondary | ICD-10-CM | POA: Insufficient documentation

## 2011-10-07 DIAGNOSIS — B351 Tinea unguium: Secondary | ICD-10-CM | POA: Insufficient documentation

## 2011-10-07 DIAGNOSIS — IMO0001 Reserved for inherently not codable concepts without codable children: Secondary | ICD-10-CM

## 2011-10-07 DIAGNOSIS — B353 Tinea pedis: Secondary | ICD-10-CM | POA: Insufficient documentation

## 2011-10-07 DIAGNOSIS — F418 Other specified anxiety disorders: Secondary | ICD-10-CM | POA: Insufficient documentation

## 2011-10-07 DIAGNOSIS — Z0000 Encounter for general adult medical examination without abnormal findings: Secondary | ICD-10-CM

## 2011-10-07 DIAGNOSIS — K219 Gastro-esophageal reflux disease without esophagitis: Secondary | ICD-10-CM | POA: Insufficient documentation

## 2011-10-07 HISTORY — DX: Reserved for inherently not codable concepts without codable children: IMO0001

## 2011-10-07 HISTORY — DX: Encounter for general adult medical examination without abnormal findings: Z00.00

## 2011-10-07 MED ORDER — LEVOTHYROXINE SODIUM 150 MCG PO TABS: 150.0000 ug | ORAL_TABLET | Freq: Every day | ORAL | Status: DC

## 2011-10-07 NOTE — Assessment & Plan Note (Signed)
Encouraged him to soak his feet in water and distilled white vinegar nightly and then apply Vick's Vapor Rub.  Reassess at next visit. May need to consider removal of large toenails.

## 2011-10-07 NOTE — Assessment & Plan Note (Signed)
Had MI earlier this year. Acknowledges this has made his depression and frustration worse. Asymptomatic at present follows with cardiology

## 2011-10-07 NOTE — Assessment & Plan Note (Signed)
Flu shot given today. Unclear if his tetanus in 2008 contained pertussis, we will request old records to investigate. Will reevaluate at next visit.

## 2011-10-07 NOTE — Assessment & Plan Note (Signed)
On max dose of Cymbalta 60mg  po bid, no further changes in meds, has recently started counseling with Jeanella Cara at [facility].

## 2011-10-07 NOTE — Assessment & Plan Note (Signed)
Will request some ercords from Neurology to investigate his history

## 2011-10-07 NOTE — Assessment & Plan Note (Signed)
Avoid offending foods and continue current meds at this time

## 2011-10-07 NOTE — Progress Notes (Signed)
Keith Morrow [id number] 1971/07/21 10/07/2011      Progress Note New Patient  Subjective  Chief Complaint  Chief Complaint  Patient presents with  . Establish Care    new patient    HPI  Patient is a 40 year old Caucasian male who is in today with a very complicated past medical history. 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 He has also suffered an MI and had a stent placed earlier this year. History of severe depression as recently started counseling and is taking multiple medications with minimal effect. He is struggling with thickening of his toenails and some discomfort as a result. He also scraped scaly skin on his feet. He has a long history of frequent skin lesions. Has had multiple ones removed all of which were benign but has been left with keloids. Presently no acute complaints. No chest pain, palpitations, shortness of breath, fevers, chills, GI or GU complaints noted today.  Past Medical History  Diagnosis Date  . HTN (hypertension)   . Hypothyroidism   . Hyperlipidemia   . MS (multiple sclerosis)   . Coronary atherosclerosis of native coronary artery     a. NSTEMI 04/20/11: tx with Promus DES to LAD; bifurcation lesion with oDx 90% tx with POBA; b. staged PCI of right post. AV Branch with Promus DES; c. residual at cath 04/21/11:  AV groove CFX 50%, mRCA 30%,; EF 55%  . Chicken pox as a child  . Heart attack 04-20-11  . Allergy   . Obesity   . Depression   . Onychomycosis   . Tinea pedis   . Keloid scar   . Preventative health care 10/07/2011  . Reflux 10/07/2011    Past Surgical History  Procedure Date  . Right wrist surgery     CTR  . Vasectomy   . Coronary angioplasty with stent placement     Family History  Problem Relation Age of Onset  . Coronary artery disease      questionable in father  . Heart attack Mother 62  . Thyroid disease Mother   . Thyroid disease Sister    . Thyroid disease Brother   . Heart disease Maternal Grandmother   . Hypertension Maternal Grandmother   . Hyperlipidemia Maternal Grandmother   . Heart disease Maternal Grandfather   . Hypertension Maternal Grandfather   . Hyperlipidemia Maternal Grandfather   . Stroke Paternal Grandfather   . Thyroid disease Sister     History   Social History  . Marital Status: Married    Spouse Name: N/A    Number of Children: 3  . Years of Education: N/A   Occupational History  . Not on file.   Social History Main Topics  . Smoking status: Never Smoker   . Smokeless tobacco: Never Used  . Alcohol Use: Yes     socially  . Drug Use: No  . Sexually Active: Yes -- Male partner(s)   Other Topics Concern  . Not on file   Social History Narrative  . No narrative on file    Current Outpatient Prescriptions on File Prior to Visit  Medication Sig Dispense Refill  . aspirin 81 MG tablet Take 81 mg by mouth daily.        Marland Kitchen atorvastatin (LIPITOR) 40 MG tablet Take 40 mg by mouth daily.        . baclofen (LIORESAL) 10 MG tablet Take 10 mg by mouth 4 (four)  times daily.       . cetirizine (ZYRTEC) 10 MG tablet Take 10 mg by mouth as needed.        . Dalfampridine (AMPYRA) 10 MG TB12 Take by mouth. Every 12 hours       . DULoxetine (CYMBALTA) 60 MG capsule Take 60 mg by mouth 2 (two) times daily.        Marland Kitchen LORazepam (ATIVAN) 1 MG tablet Take 1 mg by mouth every 6 (six) hours as needed.        . metoprolol (LOPRESSOR) 50 MG tablet Take 25 mg by mouth 2 (two) times daily.       Marland Kitchen olmesartan (BENICAR) 20 MG tablet Take 20 mg by mouth daily.        Marland Kitchen omeprazole (PRILOSEC) 20 MG capsule Take 20 mg by mouth daily.        . prasugrel (EFFIENT) 10 MG TABS Take by mouth daily.          No Known Allergies  Review of Systems  Review of Systems  Constitutional: Positive for malaise/fatigue. Negative for fever and chills.  HENT: Negative for hearing loss, nosebleeds and congestion.   Eyes:  Negative for discharge.  Respiratory: Negative for cough, sputum production, shortness of breath and wheezing.   Cardiovascular: Negative for chest pain, palpitations and leg swelling.  Gastrointestinal: Negative for heartburn, nausea, vomiting, abdominal pain, diarrhea, constipation and blood in stool.  Genitourinary: Negative for dysuria, urgency, frequency and hematuria.  Musculoskeletal: Positive for myalgias. Negative for back pain and falls.  Skin: Negative for rash.  Neurological: Positive for tingling. Negative for dizziness, tremors, sensory change, focal weakness, loss of consciousness, weakness and headaches.  Endo/Heme/Allergies: Negative for polydipsia. Does not bruise/bleed easily.  Psychiatric/Behavioral: Positive for depression. Negative for suicidal ideas. The patient is nervous/anxious. The patient does not have insomnia.     Objective  BP 111/74  Pulse 72  Temp(Src) 97.8 F (36.6 C) (Oral)  Ht 6\' 4"  (1.93 m)  Wt 295 lb 12.8 oz (134.174 kg)  BMI 36.01 kg/m2  SpO2 100%  Physical Exam  Physical Exam  Constitutional: He is oriented to person, place, and time and well-developed, well-nourished, and in no distress. No distress.       obese  HENT:  Head: Normocephalic and atraumatic.  Eyes: Conjunctivae are normal.  Neck: Neck supple. No thyromegaly present.  Cardiovascular: Normal rate, regular rhythm and normal heart sounds.   No murmur heard. Pulmonary/Chest: Effort normal and breath sounds normal. No respiratory distress.  Abdominal: He exhibits no distension and no mass. There is no tenderness.  Musculoskeletal: He exhibits no edema.  Neurological: He is alert and oriented to person, place, and time. GCS score is 15.       Walking with 2 canes now  Skin: Skin is warm.  Psychiatric: Memory, affect and judgment normal.       Assessment & Plan  Preventative health care Flu shot given today. Unclear if his tetanus in 2008 contained pertussis, we will  request old records to investigate. Will reevaluate at next visit.  Onychomycosis Encouraged him to soak his feet in water and distilled white vinegar nightly and then apply Vick's Vapor Rub.  Reassess at next visit. May need to consider removal of large toenails.  Tinea pedis Lotrisone to feet bid and reassess at next visit.  MS (multiple sclerosis) Will request some ercords from Neurology to investigate his history  Keloid scar Has had numerous lesions removed and they have all  been benign but he has been left with numerous keloids. Will monitor skin lesions, noted to have diffuse seborrhae and benign appearing moles.  Coronary atherosclerosis of native coronary artery Had MI earlier this year. Acknowledges this has made his depression and frustration worse. Asymptomatic at present follows with cardiology  Obesity Encouraged him to consider the DASH diet  HTN (hypertension) Adequately controlled at today's visit  Depression On max dose of Cymbalta 60mg  po bid, no further changes in meds, has recently started counseling with Jeanella Cara at [facility].  Hyperlipidemia Will request old labs and monitor. Continue Lipitor  Reflux Avoid offending foods and continue current meds at this time  Hypothyroidism On Synthroid, DAW 150 mcg daily

## 2011-10-07 NOTE — Assessment & Plan Note (Signed)
Will request old labs and monitor. Continue Lipitor

## 2011-10-07 NOTE — Assessment & Plan Note (Signed)
Adequately controlled at today's visit 

## 2011-10-07 NOTE — Assessment & Plan Note (Signed)
Has had numerous lesions removed and they have all been benign but he has been left with numerous keloids. Will monitor skin lesions, noted to have diffuse seborrhae and benign appearing moles.

## 2011-10-07 NOTE — Assessment & Plan Note (Signed)
Lotrisone to feet bid and reassess at next visit.

## 2011-10-07 NOTE — Assessment & Plan Note (Signed)
Encouraged him to consider the DASH diet

## 2011-10-07 NOTE — Assessment & Plan Note (Signed)
On Synthroid, DAW 150 mcg daily

## 2011-10-09 ENCOUNTER — Telehealth: Payer: Self-pay

## 2011-10-09 NOTE — Telephone Encounter (Signed)
Pt needs a TDAP at next visit. Unable to obtain note of last Tetanus

## 2011-10-14 ENCOUNTER — Other Ambulatory Visit: Payer: Self-pay | Admitting: Family Medicine

## 2011-11-03 ENCOUNTER — Other Ambulatory Visit (INDEPENDENT_AMBULATORY_CARE_PROVIDER_SITE_OTHER): Payer: Managed Care, Other (non HMO)

## 2011-11-03 DIAGNOSIS — Z0000 Encounter for general adult medical examination without abnormal findings: Secondary | ICD-10-CM

## 2011-11-03 LAB — RENAL FUNCTION PANEL
Albumin: 4.3 g/dL (ref 3.5–5.2)
BUN: 16 mg/dL (ref 6–23)
CO2: 29 mEq/L (ref 19–32)
Calcium: 9.1 mg/dL (ref 8.4–10.5)
Chloride: 105 mEq/L (ref 96–112)
Creatinine, Ser: 1 mg/dL (ref 0.4–1.5)
GFR: 90.64 mL/min (ref >60.00)
Glucose, Bld: 91 mg/dL (ref 70–99)
Phosphorus: 4.2 mg/dL (ref 2.3–4.6)
Potassium: 4.2 mEq/L (ref 3.5–5.1)
Sodium: 142 mEq/L (ref 135–145)

## 2011-11-03 LAB — TSH: TSH: 1.29 u[IU]/mL (ref 0.35–5.50)

## 2011-11-03 LAB — CBC
HCT: 43.7 % (ref 39.0–52.0)
Hemoglobin: 14.7 g/dL (ref 13.0–17.0)
MCHC: 33.7 g/dL (ref 30.0–36.0)
MCV: 89.8 fl (ref 78.0–100.0)
Platelets: 221 10*3/uL (ref 150.0–400.0)
RBC: 4.86 Mil/uL (ref 4.22–5.81)
RDW: 14.9 % — ABNORMAL HIGH (ref 11.5–14.6)
WBC: 8.2 10*3/uL (ref 4.5–10.5)

## 2011-11-03 LAB — HEPATIC FUNCTION PANEL
ALT: 45 U/L (ref 0–53)
AST: 27 U/L (ref 0–37)
Albumin: 4.3 g/dL (ref 3.5–5.2)
Alkaline Phosphatase: 60 U/L (ref 39–117)
Bilirubin, Direct: 0.1 mg/dL (ref 0.0–0.3)
Total Bilirubin: 0.7 mg/dL (ref 0.3–1.2)
Total Protein: 7.1 g/dL (ref 6.0–8.3)

## 2011-11-03 LAB — LIPID PANEL
Cholesterol: 146 mg/dL (ref 0–200)
HDL: 55.4 mg/dL (ref >39.00)
LDL Cholesterol: 80 mg/dL (ref 0–99)
Total CHOL/HDL Ratio: 3
Triglycerides: 53 mg/dL (ref 0.0–149.0)
VLDL: 10.6 mg/dL (ref 0.0–40.0)

## 2011-11-03 LAB — TESTOSTERONE: Testosterone: 183.93 ng/dL — ABNORMAL LOW (ref 350.00–890.00)

## 2011-11-17 ENCOUNTER — Encounter: Payer: Self-pay | Admitting: Family Medicine

## 2011-11-17 ENCOUNTER — Ambulatory Visit (INDEPENDENT_AMBULATORY_CARE_PROVIDER_SITE_OTHER): Payer: Managed Care, Other (non HMO) | Admitting: Family Medicine

## 2011-11-17 DIAGNOSIS — I83819 Varicose veins of unspecified lower extremities with pain: Secondary | ICD-10-CM | POA: Insufficient documentation

## 2011-11-17 DIAGNOSIS — E039 Hypothyroidism, unspecified: Secondary | ICD-10-CM

## 2011-11-17 DIAGNOSIS — G35 Multiple sclerosis: Secondary | ICD-10-CM

## 2011-11-17 DIAGNOSIS — E785 Hyperlipidemia, unspecified: Secondary | ICD-10-CM

## 2011-11-17 DIAGNOSIS — B353 Tinea pedis: Secondary | ICD-10-CM

## 2011-11-17 DIAGNOSIS — I83893 Varicose veins of bilateral lower extremities with other complications: Secondary | ICD-10-CM

## 2011-11-17 DIAGNOSIS — E291 Testicular hypofunction: Secondary | ICD-10-CM

## 2011-11-17 DIAGNOSIS — F329 Major depressive disorder, single episode, unspecified: Secondary | ICD-10-CM

## 2011-11-17 DIAGNOSIS — F341 Dysthymic disorder: Secondary | ICD-10-CM

## 2011-11-17 DIAGNOSIS — F419 Anxiety disorder, unspecified: Secondary | ICD-10-CM

## 2011-11-17 DIAGNOSIS — I10 Essential (primary) hypertension: Secondary | ICD-10-CM

## 2011-11-17 DIAGNOSIS — E349 Endocrine disorder, unspecified: Secondary | ICD-10-CM

## 2011-11-17 HISTORY — DX: Endocrine disorder, unspecified: E34.9

## 2011-11-17 HISTORY — DX: Varicose veins of unspecified lower extremity with pain: I83.819

## 2011-11-17 MED ORDER — LORAZEPAM 1 MG PO TABS: 1.0000 mg | ORAL_TABLET | Freq: Four times a day (QID) | ORAL | Status: DC | PRN

## 2011-11-17 MED ORDER — OLMESARTAN MEDOXOMIL 20 MG PO TABS: 20.0000 mg | ORAL_TABLET | Freq: Every day | ORAL | Status: DC

## 2011-11-17 MED ORDER — TESTOSTERONE CYPIONATE 200 MG/ML IM SOLN: 200.0000 mg | INTRAMUSCULAR | Status: DC

## 2011-11-17 MED ORDER — DESVENLAFAXINE SUCCINATE ER 50 MG PO TB24: 50.0000 mg | ORAL_TABLET | Freq: Every day | ORAL | Status: DC

## 2011-11-17 MED ORDER — DULOXETINE HCL 60 MG PO CPEP: 60.0000 mg | ORAL_CAPSULE | Freq: Two times a day (BID) | ORAL | Status: DC

## 2011-11-17 NOTE — Assessment & Plan Note (Signed)
Right leg with a painful varicosity just distal to the knee and with worsening blood flow to his foot. Foot in violaceous compared to left foot today. Pedal pulses slightly diminished but present. Will refer to Vascular surgery for further evaluation. Of note this lag suffers the most with his MS as well

## 2011-11-17 NOTE — Assessment & Plan Note (Signed)
Worsening with increased irritability. Reports a previous similar development with Effexor after he had been on it for a long time. He agrees to referral to psychiatry at this time and we will begin the transition from Cymbalta 60mg  bid to Pristiq at 100mg  daily as tolerated. Given 2 weeks of Pristiq samples and asked to drop his Cymbalta 60mg  to once daily for the next 2-4 weeks and to add Pristiq 50 mg daily, when he feels ready will increase Pristiq to 100mg  and discontinue Cymbalta altogether he will call with any concerns. His wife accompanies him today and expresses understanding.

## 2011-11-17 NOTE — Assessment & Plan Note (Signed)
Increase his Testosterone injections up to 200mg  every other week again, they have been doing them at home themselves for a long time. R

## 2011-11-17 NOTE — Patient Instructions (Signed)
Depression You have signs of depression. This is a common problem. It can occur at any age. It is often hard to recognize. People can suffer from depression and still have moments of enjoyment. Depression interferes with your basic ability to function in life. It upsets your relationships, sleep, eating, and work habits. CAUSES  Depression is believed to be caused by an imbalance in brain chemicals. It may be triggered by an unpleasant event. Relationship crises, a death in the family, financial worries, retirement, or other stressors are normal causes of depression. Depression may also start for no known reason. Other factors that may play a part include medical illnesses, some medicines, genetics, and alcohol or drug abuse. SYMPTOMS   Feeling unhappy or worthless.   Long-lasting (chronic) tiredness or worn-out feeling.   Self-destructive thoughts and actions.   Not being able to sleep or sleeping too much.   Eating more than usual or not eating at all.   Headaches or feeling anxious.   Trouble concentrating or making decisions.   Unexplained physical problems and substance abuse.  TREATMENT  Depression usually gets better with treatment. This can include:  Antidepressant medicines. It can take weeks before the proper dose is achieved and benefits are reached.   Talking with a therapist, clergyperson, counselor, or friend. These people can help you gain insight into your problem and regain control of your life.   Eating a good diet.   Getting regular physical exercise, such as walking for 30 minutes every day.   Not abusing alcohol or drugs.  Treating depression often takes 6 months or longer. This length of treatment is needed to keep symptoms from returning. Call your caregiver and arrange for follow-up care as suggested. SEEK IMMEDIATE MEDICAL CARE IF:   You start to have thoughts of hurting yourself or others.   Call your local emergency services (911 in [place]   Go to  your local medical emergency department.   Call the National Suicide Prevention Lifeline: 1-800-273-TALK (1-800-273-8255).  Document Released: 11/24/2005 Document Revised: 08/06/2011 Document Reviewed: 04/26/2010 [employer] Patient Information 2012 [employer]. 

## 2011-11-17 NOTE — Assessment & Plan Note (Signed)
Improving slowly with topical treatments. No changes

## 2011-11-17 NOTE — Assessment & Plan Note (Signed)
Adequately controlled no changes.  

## 2011-11-17 NOTE — Progress Notes (Signed)
Keith Morrow [id number] [birth year] 11/17/2011      Progress Note-Follow Up  Subjective  Chief Complaint  Chief Complaint  Patient presents with  . Follow-up    HPI  This is a 40 year old Caucasian male who is accompanied by his wife for followup on his new patient appointment. Overall he reports the skin on his feet are slowly improving. He does have some scaling and itching still noted at the base of his right foot. His concern is the right foot is slightly more cold and been slightly more purple since his last visit. He also has some painful varicosities in the right leg which flared at times. Her right leg is his weakest leg when it comes to his MS as well. He continues to trouble fatigue malaise and depression. He notes has become more durable as of late. Believes it is time for him to switch his medications. Reports going to something similar after he had been on Effexor for quite some time. Denies any acute illness, chest pain, palpitation, shortness of breath, GI or GU complaints at this time. Has been intermittently soaking his feet and applying vapor rub to his toenails does not note any change in the toenails thus far with her notes previously he was placed on testosterone as high as 300 mg every 2 weeks but is now down to just 200 mg once a month.  Past Medical History  Diagnosis Date  . HTN (hypertension)   . Hypothyroidism   . Hyperlipidemia   . MS (multiple sclerosis)   . Coronary atherosclerosis of native coronary artery     a. NSTEMI 04/20/11: tx with Promus DES to LAD; bifurcation lesion with oDx 90% tx with POBA; b. staged PCI of right post. AV Branch with Promus DES; c. residual at cath 04/21/11:  AV groove CFX 50%, mRCA 30%,; EF 55%  . Chicken pox as a child  . Heart attack 04-20-11  . Allergy   . Obesity   . Depression   . Onychomycosis   . Tinea pedis   . Keloid scar   . Preventative health care 10/07/2011  . Reflux 10/07/2011    Past Surgical History    Procedure Date  . Right wrist surgery     CTR  . Vasectomy   . Coronary angioplasty with stent placement     Family History  Problem Relation Age of Onset  . Coronary artery disease      questionable in father  . Heart attack Mother 58  . Thyroid disease Mother   . Thyroid disease Sister   . Thyroid disease Brother   . Heart disease Maternal Grandmother   . Hypertension Maternal Grandmother   . Hyperlipidemia Maternal Grandmother   . Heart disease Maternal Grandfather   . Hypertension Maternal Grandfather   . Hyperlipidemia Maternal Grandfather   . Stroke Paternal Grandfather   . Thyroid disease Sister     History   Social History  . Marital Status: Married    Spouse Name: N/A    Number of Children: 3  . Years of Education: N/A   Occupational History  . Not on file.   Social History Main Topics  . Smoking status: Never Smoker   . Smokeless tobacco: Never Used  . Alcohol Use: Yes     socially  . Drug Use: No  . Sexually Active: Yes -- Male partner(s)   Other Topics Concern  . Not on file   Social History Narrative  . No narrative on  file    Current Outpatient Prescriptions on File Prior to Visit  Medication Sig Dispense Refill  . aspirin 81 MG tablet Take 81 mg by mouth daily.        Marland Kitchen atorvastatin (LIPITOR) 40 MG tablet Take 40 mg by mouth daily.        . baclofen (LIORESAL) 10 MG tablet Take 10 mg by mouth 4 (four) times daily.       . cetirizine (ZYRTEC) 10 MG tablet Take 10 mg by mouth as needed.        . clotrimazole-betamethasone (LOTRISONE) cream Apply to affected area 2 times daily  45 g  2  . Dalfampridine (AMPYRA) 10 MG TB12 Take by mouth. Every 12 hours       . DULoxetine (CYMBALTA) 60 MG capsule Take 60 mg by mouth 2 (two) times daily.        Marland Kitchen LORazepam (ATIVAN) 1 MG tablet Take 1 mg by mouth every 6 (six) hours as needed.        . metoprolol (LOPRESSOR) 50 MG tablet Take 25 mg by mouth 2 (two) times daily.       Marland Kitchen olmesartan (BENICAR) 20  MG tablet Take 20 mg by mouth daily.        Marland Kitchen omeprazole (PRILOSEC) 20 MG capsule Take 20 mg by mouth daily.        . prasugrel (EFFIENT) 10 MG TABS Take by mouth daily.        Marland Kitchen SYNTHROID 150 MCG tablet TAKE 1 TABLET BY MOUTH EVERY DAY  30 tablet  0  . testosterone cypionate (DEPOTESTOTERONE CYPIONATE) 200 MG/ML injection Inject 100 mg into the muscle every 14 (fourteen) days.          No Known Allergies  Review of Systems  Review of Systems  Constitutional: Negative for fever and malaise/fatigue.  HENT: Negative for congestion.   Eyes: Negative for discharge.  Respiratory: Negative for shortness of breath.   Cardiovascular: Negative for chest pain, palpitations and leg swelling.  Gastrointestinal: Negative for nausea, abdominal pain and diarrhea.  Genitourinary: Negative for dysuria.  Musculoskeletal: Negative for falls.  Skin: Positive for rash. Negative for itching.       Dry, flakey skin on right foot > left foot persists, maybe mildly improved. Thickened toenails persist  Neurological: Positive for focal weakness. Negative for loss of consciousness and headaches.  Endo/Heme/Allergies: Negative for polydipsia.  Psychiatric/Behavioral: Positive for depression. Negative for suicidal ideas. The patient is nervous/anxious. The patient does not have insomnia.     Objective  BP 117/78  Pulse 54  Temp(Src) 98 F (36.7 C) (Oral)  Ht 6\' 4"  (1.93 m)  Wt 292 lb 12.8 oz (132.813 kg)  BMI 35.64 kg/m2  SpO2 100%  Physical Exam  Physical Exam  Constitutional: He is oriented to person, place, and time and well-developed, well-nourished, and in no distress. No distress.  HENT:  Head: Normocephalic and atraumatic.  Eyes: Conjunctivae are normal.  Neck: Neck supple. No thyromegaly present.  Cardiovascular: Normal rate, regular rhythm, normal heart sounds and intact distal pulses.   No murmur heard.      Posterior tibial and dorsalis pedis pulses intact in both feet but mildly  diminished on right 1 of 2. Mildly painful varicose vein noted at medial aspect of leg just below knee on right  Pulmonary/Chest: Effort normal and breath sounds normal. No respiratory distress.  Abdominal: He exhibits no distension and no mass. There is no tenderness.  Musculoskeletal: He exhibits  no edema.  Neurological: He is alert and oriented to person, place, and time.  Skin: Skin is warm. There is erythema.       Dry, flaky skin base of right foot. Skin of right foot increased violaceous compared to other foot.  Psychiatric: Memory, affect and judgment normal.    Lab Results  Component Value Date   TSH 1.29 11/03/2011   Lab Results  Component Value Date   WBC 8.2 11/03/2011   HGB 14.7 11/03/2011   HCT 43.7 11/03/2011   MCV 89.8 11/03/2011   PLT 221.0 11/03/2011   Lab Results  Component Value Date   CREATININE 1.0 11/03/2011   BUN 16 11/03/2011   NA 142 11/03/2011   K 4.2 11/03/2011   CL 105 11/03/2011   CO2 29 11/03/2011   Lab Results  Component Value Date   ALT 45 11/03/2011   AST 27 11/03/2011   ALKPHOS 60 11/03/2011   BILITOT 0.7 11/03/2011   Lab Results  Component Value Date   CHOL 146 11/03/2011   Lab Results  Component Value Date   HDL 55.40 11/03/2011   Lab Results  Component Value Date   LDLCALC 80 11/03/2011   Lab Results  Component Value Date   TRIG 53.0 11/03/2011   Lab Results  Component Value Date   CHOLHDL 3 11/03/2011     Assessment & Plan   Tinea pedis Improving slowly with topical treatments. No changes  Depression Worsening with increased irritability. Reports a previous similar development with Effexor after he had been on it for a long time. He agrees to referral to psychiatry at this time and we will begin the transition from Cymbalta 60mg  bid to Pristiq at 100mg  daily as tolerated. Given 2 weeks of Pristiq samples and asked to drop his Cymbalta 60mg  to once daily for the next 2-4 weeks and to add Pristiq 50 mg daily, when  he feels ready will increase Pristiq to 100mg  and discontinue Cymbalta altogether he will call with any concerns. His wife accompanies him today and expresses understanding.  Hypothyroidism Labs reviewed with patient no concerns on current dose of Synthroid  HTN (hypertension) Adequately controlled no changes  Hyperlipidemia Good response ot Atorvastatin, no changes  Varicose veins of leg with pain Right leg with a painful varicosity just distal to the knee and with worsening blood flow to his foot. Foot in violaceous compared to left foot today. Pedal pulses slightly diminished but present. Will refer to Vascular surgery for further evaluation. Of note this lag suffers the most with his MS as well  Hypotestosteronism Increase his Testosterone injections up to 200mg  every other week again, they have been doing them at home themselves for a long time. R

## 2011-11-17 NOTE — Assessment & Plan Note (Signed)
Good response ot Atorvastatin, no changes

## 2011-11-17 NOTE — Assessment & Plan Note (Signed)
Labs reviewed with patient no concerns on current dose of Synthroid

## 2011-11-25 ENCOUNTER — Encounter: Payer: Self-pay | Admitting: Family Medicine

## 2011-11-25 MED ORDER — SYNTHROID 150 MCG PO TABS: 150.0000 ug | ORAL_TABLET | Freq: Every day | ORAL | Status: DC

## 2011-11-26 ENCOUNTER — Other Ambulatory Visit: Payer: Self-pay

## 2011-11-26 DIAGNOSIS — I83893 Varicose veins of bilateral lower extremities with other complications: Secondary | ICD-10-CM

## 2011-12-04 ENCOUNTER — Encounter: Payer: Self-pay | Admitting: Family Medicine

## 2011-12-05 NOTE — Telephone Encounter (Signed)
Please advise mychart question? 

## 2011-12-10 ENCOUNTER — Encounter: Payer: Self-pay | Admitting: Family Medicine

## 2011-12-10 ENCOUNTER — Ambulatory Visit (INDEPENDENT_AMBULATORY_CARE_PROVIDER_SITE_OTHER): Payer: Managed Care, Other (non HMO) | Admitting: Family Medicine

## 2011-12-10 ENCOUNTER — Telehealth: Payer: Self-pay

## 2011-12-10 VITALS — BP 116/75 | HR 98 | Temp 98.4°F | Ht 76.0 in | Wt 297.4 lb

## 2011-12-10 DIAGNOSIS — F329 Major depressive disorder, single episode, unspecified: Secondary | ICD-10-CM

## 2011-12-10 DIAGNOSIS — I10 Essential (primary) hypertension: Secondary | ICD-10-CM

## 2011-12-10 DIAGNOSIS — L259 Unspecified contact dermatitis, unspecified cause: Secondary | ICD-10-CM

## 2011-12-10 DIAGNOSIS — J40 Bronchitis, not specified as acute or chronic: Secondary | ICD-10-CM

## 2011-12-10 DIAGNOSIS — F419 Anxiety disorder, unspecified: Secondary | ICD-10-CM

## 2011-12-10 DIAGNOSIS — J209 Acute bronchitis, unspecified: Secondary | ICD-10-CM

## 2011-12-10 HISTORY — DX: Acute bronchitis, unspecified: J20.9

## 2011-12-10 HISTORY — DX: Unspecified contact dermatitis, unspecified cause: L25.9

## 2011-12-10 MED ORDER — CEFDINIR 300 MG PO CAPS: 300.0000 mg | ORAL_CAPSULE | Freq: Two times a day (BID) | ORAL | Status: AC

## 2011-12-10 MED ORDER — METHYLPREDNISOLONE 4 MG PO KIT: PACK | ORAL | Status: AC

## 2011-12-10 MED ORDER — DESVENLAFAXINE SUCCINATE ER 50 MG PO TB24: 50.0000 mg | ORAL_TABLET | Freq: Two times a day (BID) | ORAL | Status: DC

## 2011-12-10 MED ORDER — HYDROCOD POLST-CHLORPHEN POLST 10-8 MG/5ML PO LQCR
5.0000 mL | Freq: Every evening | ORAL | Status: DC | PRN
Start: 2011-12-10 — End: 2012-01-16

## 2011-12-10 NOTE — Assessment & Plan Note (Signed)
Well controlled at today's visit. 

## 2011-12-10 NOTE — Assessment & Plan Note (Signed)
Patient with 5 day history of contact dermititis, likely due to poison ivy while outdoors clearing a fire line. He is given a Medrol dose pak and may use witch hazel and sarna lotion as well report if lesions worsen

## 2011-12-10 NOTE — Assessment & Plan Note (Signed)
Started on antibiotics, steroids and Mucinex, increase clear fluids and rest and report if symptoms do not improve

## 2011-12-10 NOTE — Patient Instructions (Signed)
Poison [employer] ivy is a inflammation of the skin (contact dermatitis) caused by touching the allergens on the leaves of the ivy plant following previous exposure to the plant. The rash usually appears 48 hours after exposure. The rash is usually bumps (papules) or blisters (vesicles) in a linear pattern. Depending on your own sensitivity, the rash may simply cause redness and itching, or it may also progress to blisters which may break open. These must be well cared for to prevent secondary bacterial (germ) infection, followed by scarring. Keep any open areas dry, clean, dressed, and covered with an antibacterial ointment if needed. The eyes may also get puffy. The puffiness is worst in the morning and gets better as the day progresses. This dermatitis usually heals without scarring, within 2 to 3 weeks without treatment. HOME CARE INSTRUCTIONS  Thoroughly wash with soap and water as soon as you have been exposed to poison ivy. You have about one half hour to remove the plant resin before it will cause the rash. This washing will destroy the oil or antigen on the skin that is causing, or will cause, the rash. Be sure to wash under your fingernails as any plant resin there will continue to spread the rash. Do not rub skin vigorously when washing affected area. Poison ivy cannot spread if no oil from the plant remains on your body. A rash that has progressed to weeping sores will not spread the rash unless you have not washed thoroughly. It is also important to wash any clothes you have been wearing as these may carry active allergens. The rash will return if you wear the unwashed clothing, even several days later. Avoidance of the plant in the future is the best measure. Poison ivy plant can be recognized by the number of leaves. Generally, poison ivy has three leaves with flowering branches on a single stem. Diphenhydramine may be purchased over the counter and used as needed for itching. Do not drive with  this medication if it makes you drowsy.Ask your caregiver about medication for children. SEEK MEDICAL CARE IF:  Open sores develop.   Redness spreads beyond area of rash.   You notice purulent (pus-like) discharge.   You have increased pain.   Other signs of infection develop (such as fever).  Document Released: 11/21/2000 Document Revised: 08/06/2011 Document Reviewed: 10/10/2009 [facility] Patient Information 2012 [place], [place].   May try Sarna anti itch lotion, witch hazel astringent and continue to use your steroid cream as indicated. Report any new concerning lesions. If bloody noses keep up consider Bactroban antibiotic ointment to nose at bedtime

## 2011-12-10 NOTE — Telephone Encounter (Signed)
Message copied by Court Joy on Wed Dec 10, 2011 12:55 PM ------      Message from: [name] A      Created: Wed Dec 10, 2011 12:34 PM       I have an my chart message from this patient he is ready to take Pristiq 100mg  now (prescribe 50 mg tabs take 2 po daily. OK to call in a 30 or 90 prescription at his discretion with 3 vs 1 refill accordingly

## 2011-12-10 NOTE — Progress Notes (Signed)
Patient ID: LONG BRIMAGE, male   DOB: 07-20-1971, 41 y.o.   MRN: [id number] Keith Morrow [id number] 09/22/1971 12/10/2011      Progress Note-Follow Up  Subjective  Chief Complaint  Chief Complaint  Patient presents with  . poison ivy  . Nasal Congestion    X 2 days  . Cough    X 2 days w/ phlegm (yellow)    HPI  Patient is a 41 yo Caucasian male in today for evaluation of a rash and congestion. The rash has been present for roughly 5 days. He was out working to clear some brush. While in the with its E. date the couldn't urinate once and then developed a painful itchy rash in the penile shaft and in that has resolved now he has some itchy rashes in his intertriginous areas bilaterally as well as on his left arm. He struggled with poison ivy in the past. He's also been struggling with a week's worth of congestion with cough productive of yellow phlegm nasal congestion productive for phlegm. He also notes headache, malaise, myalgias, postnasal drip. No chest pain, palpitations, shortness of breath. Has had some low-grade fevers but denies chills, ear pain, throat pain.  Past Medical History  Diagnosis Date  . HTN (hypertension)   . Hypothyroidism   . Hyperlipidemia   . MS (multiple sclerosis)   . Coronary atherosclerosis of native coronary artery     a. NSTEMI 04/20/11: tx with Promus DES to LAD; bifurcation lesion with oDx 90% tx with POBA; b. staged PCI of right post. AV Branch with Promus DES; c. residual at cath 04/21/11:  AV groove CFX 50%, mRCA 30%,; EF 55%  . Chicken pox as a child  . Heart attack 04-20-11  . Allergy   . Obesity   . Depression   . Onychomycosis   . Tinea pedis   . Keloid scar   . Preventative health care 10/07/2011  . Reflux 10/07/2011  . 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 Hypotestosteronism 11/17/2011  . Dermatitis, contact 12/10/2011  . Bronchitis, acute 12/10/2011    Past Surgical History  Procedure Date  . Right wrist surgery     CTR  .  Vasectomy   . Coronary angioplasty with stent placement     Family History  Problem Relation Age of Onset  . Coronary artery disease      questionable in father  . Heart attack Mother 92  . Thyroid disease Mother   . Thyroid disease Sister   . Thyroid disease Brother   . Heart disease Maternal Grandmother   . Hypertension Maternal Grandmother   . Hyperlipidemia Maternal Grandmother   . Heart disease Maternal Grandfather   . Hypertension Maternal Grandfather   . Hyperlipidemia Maternal Grandfather   . Stroke Paternal Grandfather   . Thyroid disease Sister     History   Social History  . Marital Status: Married    Spouse Name: N/A    Number of Children: 3  . Years of Education: N/A   Occupational History  . Not on file.   Social History Main Topics  . Smoking status: Never Smoker   . Smokeless tobacco: Never Used  . Alcohol Use: Yes     socially  . Drug Use: No  . Sexually Active: Yes -- Male partner(s)   Other Topics Concern  . Not on file   Social History Narrative  . No narrative on file    Current Outpatient Prescriptions on File  Prior to Visit  Medication Sig Dispense Refill  . aspirin 81 MG tablet Take 81 mg by mouth daily.        Marland Kitchen atorvastatin (LIPITOR) 40 MG tablet Take 40 mg by mouth daily.        . baclofen (LIORESAL) 10 MG tablet Take 10 mg by mouth 4 (four) times daily.       . cetirizine (ZYRTEC) 10 MG tablet Take 10 mg by mouth as needed.        . clotrimazole-betamethasone (LOTRISONE) cream Apply to affected area 2 times daily  45 g  2  . Dalfampridine (AMPYRA) 10 MG TB12 Take by mouth. Every 12 hours       . DULoxetine (CYMBALTA) 60 MG capsule Take 1 capsule (60 mg total) by mouth 2 (two) times daily.  30 capsule  0  . LORazepam (ATIVAN) 1 MG tablet Take 1 tablet (1 mg total) by mouth every 6 (six) hours as needed.  90 tablet  2  . metoprolol (LOPRESSOR) 50 MG tablet Take 25 mg by mouth 2 (two) times daily.       Marland Kitchen olmesartan (BENICAR) 20 MG  tablet Take 1 tablet (20 mg total) by mouth daily.  30 tablet  4  . omeprazole (PRILOSEC) 20 MG capsule Take 20 mg by mouth daily.        . prasugrel (EFFIENT) 10 MG TABS Take by mouth daily.        Marland Kitchen SYNTHROID 150 MCG tablet Take 1 tablet (150 mcg total) by mouth daily.  30 tablet  1  . testosterone cypionate (DEPOTESTOTERONE CYPIONATE) 200 MG/ML injection Inject 1 mL (200 mg total) into the muscle every 14 (fourteen) days.  10 mL  2    No Known Allergies  Review of Systems  Review of Systems  Constitutional: Positive for malaise/fatigue. Negative for fever.  HENT: Positive for congestion.   Eyes: Negative for discharge.  Respiratory: Positive for cough and sputum production. Negative for shortness of breath.   Cardiovascular: Negative for chest pain, palpitations and leg swelling.  Gastrointestinal: Negative for nausea, abdominal pain and diarrhea.  Genitourinary: Negative for dysuria.  Musculoskeletal: Negative for falls.  Skin: Positive for itching and rash.  Neurological: Positive for headaches. Negative for loss of consciousness.  Endo/Heme/Allergies: Negative for polydipsia.  Psychiatric/Behavioral: Negative for depression and suicidal ideas. The patient is not nervous/anxious and does not have insomnia.     Objective  BP 116/75  Pulse 98  Temp(Src) 98.4 F (36.9 C) (Oral)  Ht 6\' 4"  (1.93 m)  Wt 297 lb 6.4 oz (134.9 kg)  BMI 36.20 kg/m2  SpO2 99%  Physical Exam  Physical Exam  Constitutional: He is oriented to person, place, and time and well-developed, well-nourished, and in no distress. No distress.  HENT:  Head: Normocephalic and atraumatic.  Eyes: Conjunctivae are normal.  Neck: Neck supple. No thyromegaly present.  Cardiovascular: Normal rate, regular rhythm and normal heart sounds.   No murmur heard. Pulmonary/Chest: Effort normal and breath sounds normal. No respiratory distress.  Abdominal: He exhibits no distension and no mass. There is no tenderness.    Musculoskeletal: He exhibits no edema.  Neurological: He is alert and oriented to person, place, and time.  Skin: Skin is warm. Rash noted. There is erythema.       Erythematous patches raised and irritated on left arm and in groin  Psychiatric: Memory, affect and judgment normal.    Lab Results  Component Value Date   TSH  1.29 11/03/2011   Lab Results  Component Value Date   WBC 8.2 11/03/2011   HGB 14.7 11/03/2011   HCT 43.7 11/03/2011   MCV 89.8 11/03/2011   PLT 221.0 11/03/2011   Lab Results  Component Value Date   CREATININE 1.0 11/03/2011   BUN 16 11/03/2011   NA 142 11/03/2011   K 4.2 11/03/2011   CL 105 11/03/2011   CO2 29 11/03/2011   Lab Results  Component Value Date   ALT 45 11/03/2011   AST 27 11/03/2011   ALKPHOS 60 11/03/2011   BILITOT 0.7 11/03/2011   Lab Results  Component Value Date   CHOL 146 11/03/2011   Lab Results  Component Value Date   HDL 55.40 11/03/2011   Lab Results  Component Value Date   LDLCALC 80 11/03/2011   Lab Results  Component Value Date   TRIG 53.0 11/03/2011   Lab Results  Component Value Date   CHOLHDL 3 11/03/2011     Assessment & Plan  HTN (hypertension) Well controlled at today's visit.  Dermatitis, contact Patient with 5 day history of contact dermititis, likely due to poison ivy while outdoors clearing a fire line. He is given a Medrol dose pak and may use witch hazel and sarna lotion as well report if lesions worsen  Bronchitis, acute Started on antibiotics, steroids and Mucinex, increase clear fluids and rest and report if symptoms do not improve

## 2011-12-15 ENCOUNTER — Encounter: Payer: Self-pay | Admitting: Family Medicine

## 2011-12-16 ENCOUNTER — Telehealth: Payer: Self-pay

## 2011-12-16 DIAGNOSIS — F419 Anxiety disorder, unspecified: Secondary | ICD-10-CM

## 2011-12-16 DIAGNOSIS — F32A Depression, unspecified: Secondary | ICD-10-CM

## 2011-12-16 MED ORDER — DESVENLAFAXINE SUCCINATE ER 50 MG PO TB24: 50.0000 mg | ORAL_TABLET | Freq: Two times a day (BID) | ORAL | Status: DC

## 2011-12-16 NOTE — Telephone Encounter (Signed)
RX sent again to pharmacy

## 2011-12-17 ENCOUNTER — Ambulatory Visit (INDEPENDENT_AMBULATORY_CARE_PROVIDER_SITE_OTHER): Payer: Managed Care, Other (non HMO) | Admitting: Licensed Clinical Social Worker

## 2011-12-17 DIAGNOSIS — F331 Major depressive disorder, recurrent, moderate: Secondary | ICD-10-CM

## 2011-12-22 ENCOUNTER — Encounter: Payer: Self-pay | Admitting: Family Medicine

## 2011-12-22 DIAGNOSIS — F419 Anxiety disorder, unspecified: Secondary | ICD-10-CM

## 2011-12-22 MED ORDER — DULOXETINE HCL 60 MG PO CPEP: 60.0000 mg | ORAL_CAPSULE | Freq: Two times a day (BID) | ORAL | Status: DC

## 2011-12-27 ENCOUNTER — Encounter: Payer: Self-pay | Admitting: Family Medicine

## 2012-01-02 ENCOUNTER — Encounter: Payer: Self-pay | Admitting: Family Medicine

## 2012-01-02 DIAGNOSIS — F419 Anxiety disorder, unspecified: Secondary | ICD-10-CM

## 2012-01-02 DIAGNOSIS — F329 Major depressive disorder, single episode, unspecified: Secondary | ICD-10-CM

## 2012-01-05 MED ORDER — DULOXETINE HCL 60 MG PO CPEP: 60.0000 mg | ORAL_CAPSULE | Freq: Two times a day (BID) | ORAL | Status: DC

## 2012-01-09 ENCOUNTER — Other Ambulatory Visit: Payer: Managed Care, Other (non HMO)

## 2012-01-12 ENCOUNTER — Other Ambulatory Visit (INDEPENDENT_AMBULATORY_CARE_PROVIDER_SITE_OTHER): Payer: Managed Care, Other (non HMO)

## 2012-01-12 DIAGNOSIS — E291 Testicular hypofunction: Secondary | ICD-10-CM

## 2012-01-12 LAB — TESTOSTERONE: Testosterone: 260.97 ng/dL — ABNORMAL LOW (ref 350.00–890.00)

## 2012-01-13 ENCOUNTER — Encounter: Payer: Self-pay | Admitting: Family Medicine

## 2012-01-16 ENCOUNTER — Ambulatory Visit (INDEPENDENT_AMBULATORY_CARE_PROVIDER_SITE_OTHER): Payer: Managed Care, Other (non HMO) | Admitting: Family Medicine

## 2012-01-16 ENCOUNTER — Encounter: Payer: Self-pay | Admitting: Family Medicine

## 2012-01-16 DIAGNOSIS — K219 Gastro-esophageal reflux disease without esophagitis: Secondary | ICD-10-CM

## 2012-01-16 DIAGNOSIS — G35 Multiple sclerosis: Secondary | ICD-10-CM

## 2012-01-16 DIAGNOSIS — F329 Major depressive disorder, single episode, unspecified: Secondary | ICD-10-CM

## 2012-01-16 DIAGNOSIS — E291 Testicular hypofunction: Secondary | ICD-10-CM

## 2012-01-16 DIAGNOSIS — B351 Tinea unguium: Secondary | ICD-10-CM

## 2012-01-16 DIAGNOSIS — G35D Multiple sclerosis, unspecified: Secondary | ICD-10-CM

## 2012-01-16 DIAGNOSIS — IMO0001 Reserved for inherently not codable concepts without codable children: Secondary | ICD-10-CM

## 2012-01-16 DIAGNOSIS — J209 Acute bronchitis, unspecified: Secondary | ICD-10-CM

## 2012-01-16 DIAGNOSIS — E349 Endocrine disorder, unspecified: Secondary | ICD-10-CM

## 2012-01-16 DIAGNOSIS — F341 Dysthymic disorder: Secondary | ICD-10-CM

## 2012-01-16 DIAGNOSIS — I10 Essential (primary) hypertension: Secondary | ICD-10-CM

## 2012-01-16 DIAGNOSIS — F419 Anxiety disorder, unspecified: Secondary | ICD-10-CM

## 2012-01-16 HISTORY — DX: Endocrine disorder, unspecified: E34.9

## 2012-01-16 MED ORDER — DULOXETINE HCL 60 MG PO CPEP: 60.0000 mg | ORAL_CAPSULE | Freq: Two times a day (BID) | ORAL | Status: DC

## 2012-01-16 MED ORDER — TESTOSTERONE CYPIONATE 200 MG/ML IM SOLN: 200.0000 mg | INTRAMUSCULAR | Status: DC

## 2012-01-16 MED ORDER — LAMISIL 250 MG PO TABS: 250.0000 mg | ORAL_TABLET | Freq: Every day | ORAL | Status: DC

## 2012-01-16 NOTE — Assessment & Plan Note (Signed)
No significant changes since last visit.

## 2012-01-16 NOTE — Progress Notes (Signed)
Patient ID: Keith Morrow, male   DOB: [birth year], 41 y.o.   MRN: [id number] DEJON LUKAS [id number] 04-22-1971 01/16/2012      Progress Note-Follow Up  Subjective  Chief Complaint  Chief Complaint  Patient presents with  . Follow-up    2 month follow up    HPI  This 41 year old Caucasian male who is in today for followup. He tried switching him from Cymbalta to proceed last month but he became excessively her trouble. He is now back on Cymbalta and doing better. He continues to struggle of onychomycosis and tinea pedis but has not been using the vinegar soaks as recommended. His respiratory symptoms have resolved. No cough, fevers, chest pain, palpitations, shortness of breath. He is tolerating his testosterone shots and is increasing to three out of four weeks per month no acute illness, fevers, congestion, GI or GU complaints noted.  Past Medical History  Diagnosis Date  . HTN (hypertension)   . Hypothyroidism   . Hyperlipidemia   . MS (multiple sclerosis)   . Coronary atherosclerosis of native coronary artery     a. NSTEMI 04/20/11: tx with Promus DES to LAD; bifurcation lesion with oDx 90% tx with POBA; b. staged PCI of right post. AV Branch with Promus DES; c. residual at cath 04/21/11:  AV groove CFX 50%, mRCA 30%,; EF 55%  . Chicken pox as a child  . Heart attack 04-20-11  . Allergy   . Obesity   . Depression   . Onychomycosis   . Tinea pedis   . Keloid scar   . Preventative health care 10/07/2011  . Reflux 10/07/2011  . 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 Hypotestosteronism 11/17/2011  . Dermatitis, contact 12/10/2011  . Bronchitis, acute 12/10/2011  . Testosterone deficiency 01/16/2012    Past Surgical History  Procedure Date  . Right wrist surgery     CTR  . Vasectomy   . Coronary angioplasty with stent placement     Family History  Problem Relation Age of Onset  . Coronary artery disease      questionable in father  . Heart attack Mother 56  .  Thyroid disease Mother   . Thyroid disease Sister   . Thyroid disease Brother   . Heart disease Maternal Grandmother   . Hypertension Maternal Grandmother   . Hyperlipidemia Maternal Grandmother   . Heart disease Maternal Grandfather   . Hypertension Maternal Grandfather   . Hyperlipidemia Maternal Grandfather   . Stroke Paternal Grandfather   . Thyroid disease Sister     History   Social History  . Marital Status: Married    Spouse Name: N/A    Number of Children: 3  . Years of Education: N/A   Occupational History  . Not on file.   Social History Main Topics  . Smoking status: Never Smoker   . Smokeless tobacco: Never Used  . Alcohol Use: Yes     socially  . Drug Use: No  . Sexually Active: Yes -- Male partner(s)   Other Topics Concern  . Not on file   Social History Narrative  . No narrative on file    Current Outpatient Prescriptions on File Prior to Visit  Medication Sig Dispense Refill  . aspirin 81 MG tablet Take 81 mg by mouth daily.        Marland Kitchen atorvastatin (LIPITOR) 40 MG tablet Take 40 mg by mouth daily.        . baclofen (LIORESAL) 10  MG tablet Take 10 mg by mouth 4 (four) times daily.       . cetirizine (ZYRTEC) 10 MG tablet Take 10 mg by mouth as needed.        . Dalfampridine (AMPYRA) 10 MG TB12 Take by mouth. Every 12 hours       . LORazepam (ATIVAN) 1 MG tablet Take 1 tablet (1 mg total) by mouth every 6 (six) hours as needed.  90 tablet  2  . metoprolol (LOPRESSOR) 50 MG tablet Take 25 mg by mouth 2 (two) times daily.       Marland Kitchen olmesartan (BENICAR) 20 MG tablet Take 1 tablet (20 mg total) by mouth daily.  30 tablet  4  . prasugrel (EFFIENT) 10 MG TABS Take by mouth daily.        Marland Kitchen SYNTHROID 150 MCG tablet Take 1 tablet (150 mcg total) by mouth daily.  30 tablet  1    No Known Allergies  Review of Systems  Review of Systems  Constitutional: Positive for malaise/fatigue. Negative for fever.  HENT: Negative for congestion.   Eyes: Negative for  discharge.  Respiratory: Negative for shortness of breath.   Cardiovascular: Negative for chest pain, palpitations and leg swelling.  Gastrointestinal: Negative for nausea, abdominal pain and diarrhea.  Genitourinary: Negative for dysuria.  Musculoskeletal: Negative for falls.  Skin: Negative for rash.  Neurological: Negative for loss of consciousness and headaches.  Endo/Heme/Allergies: Negative for polydipsia.  Psychiatric/Behavioral: Negative for depression and suicidal ideas. The patient is not nervous/anxious and does not have insomnia.        Tried Pristiq this past month but became more irritable so they switched him back to Cymbalta. He feels better back on this.    Objective  BP 107/69  Pulse 68  Temp(Src) 98.1 F (36.7 C) (Temporal)  Ht 6\' 4"  (1.93 m)  Wt 295 lb (133.811 kg)  BMI 35.91 kg/m2  SpO2 98%  Physical Exam  Physical Exam  Constitutional: He is oriented to person, place, and time and well-developed, well-nourished, and in no distress. No distress.  HENT:  Head: Normocephalic and atraumatic.  Eyes: Conjunctivae are normal.  Neck: Neck supple. No thyromegaly present.  Cardiovascular: Normal rate, regular rhythm and normal heart sounds.   No murmur heard. Pulmonary/Chest: Effort normal and breath sounds normal. No respiratory distress.  Abdominal: He exhibits no distension and no mass. There is no tenderness.  Musculoskeletal: He exhibits no edema.  Neurological: He is alert and oriented to person, place, and time.  Skin: Skin is warm.  Psychiatric: Memory, affect and judgment normal.    Lab Results  Component Value Date   TSH 1.29 11/03/2011   Lab Results  Component Value Date   WBC 8.2 11/03/2011   HGB 14.7 11/03/2011   HCT 43.7 11/03/2011   MCV 89.8 11/03/2011   PLT 221.0 11/03/2011   Lab Results  Component Value Date   CREATININE 1.0 11/03/2011   BUN 16 11/03/2011   NA 142 11/03/2011   K 4.2 11/03/2011   CL 105 11/03/2011   CO2 29  11/03/2011   Lab Results  Component Value Date   ALT 45 11/03/2011   AST 27 11/03/2011   ALKPHOS 60 11/03/2011   BILITOT 0.7 11/03/2011   Lab Results  Component Value Date   CHOL 146 11/03/2011   Lab Results  Component Value Date   HDL 55.40 11/03/2011   Lab Results  Component Value Date   LDLCALC 80 11/03/2011   Lab  Results  Component Value Date   TRIG 53.0 11/03/2011   Lab Results  Component Value Date   CHOLHDL 3 11/03/2011     Assessment & Plan  MS (multiple sclerosis) No significant changes since last visit.  HTN (hypertension) Well controlled no changes  Bronchitis, acute Resolved, lungs clear today  Reflux Well controlled most of the time if he does not forget his Omeprazole each day. On occasion if he eats offending foods he can have a flare, is encouraged to try a dose of Ranitidine as needed when he knows he is going to cheat.  Onychomycosis Requesting Lamisil today he is given a 30 day rx and a refill if he needs to take the refill he is encouraged to call and set up a lab appt to have just a hepatic panel drawn prior to the second month of therapy  Testosterone deficiency Improving with shots, did skip his scheduled Sunday night shot prior to his Monday blood draw. Was low, increased his shots to 3 per 4 week period. Recheck in 10 weeks

## 2012-01-16 NOTE — Assessment & Plan Note (Signed)
Resolved, lungs clear today

## 2012-01-16 NOTE — Assessment & Plan Note (Signed)
Improving with shots, did skip his scheduled Sunday night shot prior to his Monday blood draw. Was low, increased his shots to 3 per 4 week period. Recheck in 10 weeks

## 2012-01-16 NOTE — Assessment & Plan Note (Signed)
Well controlled most of the time if he does not forget his Omeprazole each day. On occasion if he eats offending foods he can have a flare, is encouraged to try a dose of Ranitidine as needed when he knows he is going to cheat.

## 2012-01-16 NOTE — Assessment & Plan Note (Signed)
Well controlled no changes 

## 2012-01-16 NOTE — Assessment & Plan Note (Signed)
Requesting Lamisil today he is given a 30 day rx and a refill if he needs to take the refill he is encouraged to call and set up a lab appt to have just a hepatic panel drawn prior to the second month of therapy

## 2012-01-16 NOTE — Patient Instructions (Signed)

## 2012-01-18 ENCOUNTER — Other Ambulatory Visit: Payer: Self-pay | Admitting: Family Medicine

## 2012-01-21 ENCOUNTER — Encounter: Payer: Self-pay | Admitting: Cardiovascular Disease

## 2012-01-21 ENCOUNTER — Ambulatory Visit (INDEPENDENT_AMBULATORY_CARE_PROVIDER_SITE_OTHER): Payer: Managed Care, Other (non HMO) | Admitting: Cardiovascular Disease

## 2012-01-21 DIAGNOSIS — I2510 Atherosclerotic heart disease of native coronary artery without angina pectoris: Secondary | ICD-10-CM

## 2012-01-21 DIAGNOSIS — I10 Essential (primary) hypertension: Secondary | ICD-10-CM

## 2012-01-21 DIAGNOSIS — E785 Hyperlipidemia, unspecified: Secondary | ICD-10-CM

## 2012-01-21 NOTE — Patient Instructions (Signed)
Your physician wants you to follow-up in: 1 YEAR.  You will receive a reminder letter in the mail two months in advance. If you don't receive a letter, please call our office to schedule the follow-up appointment.  Your physician has requested that you have a lexiscan myoview in MAY 2013. For further information please visit [email or web address] Please follow instruction sheet, as given.  Your physician recommends that you continue on your current medications as directed. Please refer to the Current Medication list given to you today.  Cardiac Diet This diet can help prevent heart disease and stroke. Many factors influence your heart health, including eating and exercise habits. Coronary risk rises a lot with abnormal blood fat (lipid) levels. Cardiac meal planning includes limiting unhealthy fats, increasing healthy fats, and making other small dietary changes. General guidelines are as follows:  Adjust calorie intake to reach and maintain desirable body weight.   Limit total fat intake to less than 30% of total calories. Saturated fat should be less than 7% of calories.   Saturated fats are found in animal products and in some vegetable products. Saturated vegetable fats are found in coconut oil, cocoa butter, palm oil, and palm kernel oil. Read labels carefully to avoid these products as much as possible. Use butter in moderation. Choose tub margarines and oils that have 2 grams of fat or less. Good cooking oils are canola and olive oils.   Practice low-fat cooking techniques. Do not fry food. Instead, broil, bake, boil, steam, grill, roast on a rack, stir-fry, or microwave it. Other fat reducing suggestions include:   Remove the skin from poultry.   Remove all visible fat from meats.   Skim the fat off stews, soups, and gravies before serving them.   Steam vegetables in water or broth instead of sauting them in fat.   Avoid foods with trans fat (or hydrogenated oils), such as  commercially fried foods and commercially baked goods. Commercial shortening and deep-frying fats will contain trans fat.   Increase intake of fruits, vegetables, whole grains, and legumes to replace foods high in fat.   Increase consumption of nuts, legumes, and seeds to at least 4 servings weekly. One serving of a legume equals  cup, and 1 serving of nuts or seeds equals  cup.   Choose whole grains more often. Have 3 servings per day (a serving is 1 ounce [oz]).   Have at least 4 cups of fruit and vegetable a day.   Increase your intake of soluble fiber to 10 to 25 grams per day. Soluble fiber binds cholesterol to be removed from the blood. Foods high in soluble fiber are dried beans, citrus fruits, oats, apples, bananas, broccoli, Brussels sprouts, and eggplant.   Try to include foods fortified with plant sterols or stanols, such as yogurt, breads, juices, or margarines. Choose several fortified foods to achieve a daily intake of 2 to 3 grams of plant sterols or stanols.   Foods with omega-3 fats can help reduce your risk of heart disease. Aim to have a 3.5 oz portion of fatty fish twice per week, such as salmon, mackerel, albacore tuna, sardines, lake trout, or herring. If you wish to take a fish oil supplement, choose one that contains 1 gram of both DHA and EPA.   Limit processed meats to 2 servings (3 oz portion) weekly.   Limit the sodium in your diet to 1500 milligrams (mg) per day. If you have high blood pressure, talk to a registered dietitian about  a DASH (Dietary Approaches to Stop Hypertension) eating plan.   Limit beverages with added sugar, such as soda, to no more than 36 ounces per week.  CHOOSING FOODS Starches  Allowed: Breads: All kinds (wheat, rye, raisin, white, oatmeal, [place], [place], and English muffin bread). Low-fat rolls: English muffins, frankfurter and hamburger buns, bagels, pita bread, tortillas (not fried). Pancakes, waffles, biscuits, and muffins made  with recommended oil.   Avoid: Products made with saturated or trans fats, oils, or whole milk products. Butter rolls, cheese breads, croissants. Commercial doughnuts, muffins, sweet rolls, biscuits, waffles, pancakes, store-bought mixes.  Crackers  Allowed: Low-fat crackers and snacks: Animal, graham, rye, saltine (with recommended oil, no lard), oyster, and matzo crackers. Bread sticks, melba toast, rusks, flatbread, pretzels, and light popcorn.   Avoid: High-fat crackers: cheese crackers, butter crackers, and those made with coconut, palm oil, or trans fat (hydrogenated oils). Buttered popcorn.  Cereals  Allowed: Hot or cold whole-grain cereals.   Avoid: Cereals containing coconut, hydrogenated vegetable fat, or animal fat.  Potatoes / Pasta / Rice  Allowed: All kinds of potatoes, rice, and pasta (such as macaroni, spaghetti, and noodles).   Avoid: Pasta or rice prepared with cream sauce or high-fat cheese. Chow mein noodles, [place] fries.  Vegetables  Allowed: All vegetables and vegetable juices.   Avoid: Fried vegetables. Vegetables in cream, butter, or high-fat cheese sauces. Limit coconut. Fruit in cream or custard.  Meat and Meat Substitutes  Allowed: Limit your intake of meat, seafood, and poultry to no more than 6 oz (cooked weight) per day. All lean, well-trimmed beef, veal, pork, and lamb. All chicken and [place] without skin. All fish and shellfish. Wild game: wild duck, rabbit, pheasant, and venison. Meatless dishes: recipes with dried beans, peas, lentils, and tofu (soybean curd). Seeds and nuts: all seeds and most nuts.   Avoid: Prime grade and other heavily marbled and fatty meats, such as short ribs, spare ribs, rib eye roast or steak, frankfurters, sausage, bacon, and high-fat luncheon meats, mutton. Caviar. Commercially fried fish. Domestic duck, goose, venison sausage. Organ meats: liver, gizzard, heart, chitterlings, brains, kidney, sweetbreads.  Dairy  Allowed: Egg  whites or low-cholesterol egg substitutes may be used as desired. Low-fat cheeses: nonfat or low-fat cottage cheese (1% or 2% fat), cheeses made with part skim milk, such as mozzarella, farmers, string, or ricotta. (Cheeses should be labeled no more than 2 to 6 grams fat per oz.)   Avoid: Whole milk cheeses, including colby, cheddar, [place], [address], [place], [place], [place], [address], Swiss, and blue. Creamed cottage cheese, cream cheese.  Milk  Allowed: Skim (or 1%) milk: liquid, powdered, or evaporated. Buttermilk made with low-fat milk. Drinks made with skim or low-fat milk or cocoa. Chocolate milk or cocoa made with skim or low-fat (1%) milk. Nonfat or low-fat yogurt.   Avoid: Whole milk and whole milk products, including buttermilk or yogurt made from whole milk, drinks made from whole milk. Condensed milk, evaporated whole milk, and 2% milk.  Soups and Combination Foods  Allowed: Low-fat low-sodium soups: broth, dehydrated soups, homemade broth, soups with the fat removed, homemade cream soups made with skim or low-fat milk. Low-fat spaghetti, lasagna, chili, and Spanish rice if low-fat ingredients and low-fat cooking techniques are used.   Avoid: Cream soups made with whole milk, cream, or high-fat cheese. All other soups.  Desserts and Sweets  Allowed: Sherbet, fruit ices, gelatins, meringues, and angel food cake. Homemade desserts with recommended fats, oils, and milk products. Jam, jelly,  honey, marmalade, sugars, and syrups. Pure sugar candy, such as gum drops, hard candy, jelly beans, marshmallows, mints, and small amounts of dark chocolate.   Avoid: Commercially prepared cakes, pies, cookies, frosting, pudding, or mixes for these products. Desserts containing whole milk products, chocolate, coconut, lard, palm oil, or palm kernel oil. Ice cream or ice cream drinks. Candy that contains chocolate, coconut, butter, hydrogenated fat, or unknown ingredients. Buttered syrups.  Fats  and Oils  Allowed: Vegetable oils: safflower, sunflower, corn, soybean, cottonseed, sesame, canola, olive, or peanut. Non-hydrogenated margarines. Salad dressing or mayonnaise: homemade or commercial, made with a recommended oil. Low or nonfat salad dressing or mayonnaise.   Limit added fats and oils to 6 to 8 tsp per day (includes fats used in cooking, baking, salads, and spreads on bread). Remember to count the "hidden fats" in foods.   Avoid: Solid fats and shortenings: butter, lard, salt pork, bacon drippings. Gravy containing meat fat, shortening, or suet. Cocoa butter, coconut. Coconut oil, palm oil, palm kernel oil, or hydrogenated oils: these ingredients are often used in bakery products, nondairy creamers, whipped toppings, candy, and commercially fried foods. Read labels carefully. Salad dressings made of unknown oils, sour cream, or cheese, such as blue cheese and Roquefort. Cream, all kinds: half-and-half, light, heavy, or whipping. Sour cream or cream cheese (even if "light" or low-fat). Nondairy cream substitutes: coffee creamers and sour cream substitutes made with palm, palm kernel, hydrogenated oils, or coconut oil.  Beverages  Allowed: Coffee (regular or decaffeinated), tea. diet carbonated beverages, mineral water. Alcohol: Check with your caregiver. Moderation is recommended.   Avoid: Whole milk, regular sodas, and juice drinks with added sugar.  Condiments  Allowed: All seasonings and condiments. Cocoa powder. "Cream" sauces made with recommended ingredients.   Avoid: Carob powder made with hydrogenated fats.  SAMPLE MENU Breakfast   cup orange juice    cup oatmeal   1 slice toast   1 tsp margarine   1 cup skim milk  Lunch  [place] sandwich with 2 oz [place], 2 slices bread   Lettuce and tomato slices   Fresh fruit   Carrot sticks   Coffee or tea   Snack   Fresh fruit or low-fat crackers  Dinner  3 oz lean ground beef   1 baked potato   1 tsp  margarine    cup asparagus   Lettuce salad   1 tbs non-creamy dressing    cup peach slices   1 cup skim milk  Document Released: 09/02/2008 Document Revised: 08/06/2011 Document Reviewed: 09/02/2008 [facility] Patient Information 2012 [place], [place].

## 2012-01-21 NOTE — Assessment & Plan Note (Signed)
Last set of lipids reviewed. LDL is 80 and HDL is 55. He will continue on atorvastatin.

## 2012-01-21 NOTE — Assessment & Plan Note (Signed)
The patient has extensive CAD as outlined. He has undergone bifurcation stenting of the LAD/diagonal branches and staged PCI of the right coronary artery. He has physical limitation for multiple sclerosis and I think it would be prudent to obtain a Lexiscan Myoview nuclear stress test to rule out ischemia as he had multivessel disease and acute coronary syndrome last year. Will obtain this at about 12 months from his initial presentation. If his stress test is negative, would consider discontinuation of effient.

## 2012-01-21 NOTE — Progress Notes (Signed)
HPI:  41 year old gentleman presented for followup evaluation. The patient has premature coronary artery disease and presented with non-ST elevation infarction in May 2012. He underwent complex stenting of the LAD involving a diagonal bifurcation point. He also was treated with a stent in the right coronary artery for treatment of severe stenosis. He presents today for followup evaluation. Patient is physically limited by multiple sclerosis. He denies chest pain, chest pressure, dyspnea, edema, orthopnea, or PND. He reports compliance with his medical program. The patient does not engage in regular exercise. He previously lost some weight with Weight Watchers but he is gained this back.  Outpatient Encounter Prescriptions as of 01/21/2012  Medication Sig Dispense Refill  . aspirin 81 MG tablet Take 81 mg by mouth daily.        Marland Kitchen atorvastatin (LIPITOR) 40 MG tablet Take 40 mg by mouth daily.        . baclofen (LIORESAL) 10 MG tablet Take 10 mg by mouth 4 (four) times daily.       . cetirizine (ZYRTEC) 10 MG tablet Take 10 mg by mouth as needed.        . Dalfampridine (AMPYRA) 10 MG TB12 Take by mouth. Every 12 hours       . DULoxetine (CYMBALTA) 60 MG capsule Take 1 capsule (60 mg total) by mouth 2 (two) times daily.  180 capsule  3  . LAMISIL 250 MG tablet Take 1 tablet (250 mg total) by mouth daily.  30 tablet  1  . LORazepam (ATIVAN) 1 MG tablet Take 1 tablet (1 mg total) by mouth every 6 (six) hours as needed.  90 tablet  2  . olmesartan (BENICAR) 20 MG tablet Take 1 tablet (20 mg total) by mouth daily.  30 tablet  4  . prasugrel (EFFIENT) 10 MG TABS Take by mouth daily.        Marland Kitchen SYNTHROID 150 MCG tablet TAKE 1 TABLET (150 MCG TOTAL) BY MOUTH DAILY.  30 tablet  1  . testosterone cypionate (DEPOTESTOTERONE CYPIONATE) 200 MG/ML injection Inject 200 mg into the muscle every 14 (fourteen) days. Every week for 3 weeks than skip a week      . DISCONTD: metoprolol (LOPRESSOR) 50 MG tablet Take 25 mg by  mouth 2 (two) times daily.       Marland Kitchen DISCONTD: testosterone cypionate (DEPOTESTOTERONE CYPIONATE) 200 MG/ML injection Inject 1 mL (200 mg total) into the muscle every 14 (fourteen) days.  10 mL  2    No Known Allergies  Past Medical History  Diagnosis Date  . HTN (hypertension)   . Hypothyroidism   . Hyperlipidemia   . MS (multiple sclerosis)   . Coronary atherosclerosis of native coronary artery     a. NSTEMI 04/20/11: tx with Promus DES to LAD; bifurcation lesion with oDx 90% tx with POBA; b. staged PCI of right post. AV Branch with Promus DES; c. residual at cath 04/21/11:  AV groove CFX 50%, mRCA 30%,; EF 55%  . Chicken pox as a child  . Heart attack 04-20-11  . Allergy   . Obesity   . Depression   . Onychomycosis   . Tinea pedis   . Keloid scar   . Preventative health care 10/07/2011  . Reflux 10/07/2011  . 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 Hypotestosteronism 11/17/2011  . Dermatitis, contact 12/10/2011  . Bronchitis, acute 12/10/2011  . Testosterone deficiency 01/16/2012    ROS: positive for gastroesophageal reflux symptoms when eating chocolate or  drinking caffeine. Positive for redness of the right leg when in a dependent position. Otherwise negative except as per HPI  BP 104/78  Pulse 73  Ht 6\' 3"  (1.905 m)  Wt 134.718 kg (297 lb)  BMI 37.12 kg/m2  PHYSICAL EXAM: Pt is alert and oriented, obese male, very pleasant, in NAD HEENT: normal Neck: JVP - normal, carotids 2+= without bruits Lungs: CTA bilaterally CV: RRR without murmur or gallop Abd: soft, NT, Positive BS, no hepatomegaly Ext: no C/C/E, distal pulses intact and equal Skin: warm/dry no rash  EKG:  Normal sinus rhythm, 73 beats per minute, within normal limits.  ASSESSMENT AND PLAN:

## 2012-01-21 NOTE — Assessment & Plan Note (Signed)
The patient is on Benicar with good control of his blood pressure.

## 2012-02-10 ENCOUNTER — Ambulatory Visit (INDEPENDENT_AMBULATORY_CARE_PROVIDER_SITE_OTHER): Payer: Managed Care, Other (non HMO) | Admitting: Vascular Surgery

## 2012-02-10 ENCOUNTER — Other Ambulatory Visit: Payer: Self-pay | Admitting: *Deleted

## 2012-02-10 ENCOUNTER — Encounter (INDEPENDENT_AMBULATORY_CARE_PROVIDER_SITE_OTHER): Payer: Managed Care, Other (non HMO) | Admitting: *Deleted

## 2012-02-10 ENCOUNTER — Encounter: Payer: Self-pay | Admitting: Vascular Surgery

## 2012-02-10 VITALS — BP 117/62 | HR 78 | Resp 16 | Ht 75.0 in | Wt 295.0 lb

## 2012-02-10 DIAGNOSIS — I83893 Varicose veins of bilateral lower extremities with other complications: Secondary | ICD-10-CM

## 2012-02-10 DIAGNOSIS — I8390 Asymptomatic varicose veins of unspecified lower extremity: Secondary | ICD-10-CM | POA: Insufficient documentation

## 2012-02-10 NOTE — Progress Notes (Signed)
Subjective:     Patient ID: Keith Morrow, male   DOB: 1971-09-13, 41 y.o.   MRN: [id number]  HPI this 41 year old male patient with multiple sclerosis was referred for swelling and prominent varicosities in the right leg. The patient states he has had calf varicosities since she was 41 years old it should become larger. He has developed increasing edema over the last few years from the distal thigh to the foot. He has no history of DVT, thrombophlebitis, stasis ulcers, bleeding. He does not wear elastic compression stockings. The left leg does not swell. He does not know if these are painful because his MS does cause spasticity and discomfort bilaterally.  Past Medical History  Diagnosis Date  . HTN (hypertension)   . Hypothyroidism   . Hyperlipidemia   . MS (multiple sclerosis)   . Coronary atherosclerosis of native coronary artery     a. NSTEMI 04/20/11: tx with Promus DES to LAD; bifurcation lesion with oDx 90% tx with POBA; b. staged PCI of right post. AV Branch with Promus DES; c. residual at cath 04/21/11:  AV groove CFX 50%, mRCA 30%,; EF 55%  . Chicken pox as a child  . Heart attack 04-20-11  . Allergy   . Obesity   . Depression   . Onychomycosis   . Tinea pedis   . Keloid scar   . Preventative health care 10/07/2011  . Reflux 10/07/2011  . 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 Hypotestosteronism 11/17/2011  . Dermatitis, contact 12/10/2011  . Bronchitis, acute 12/10/2011  . Testosterone deficiency 01/16/2012    History  Substance Use Topics  . Smoking status: Never Smoker   . Smokeless tobacco: Never Used  . Alcohol Use: Yes     socially    Family History  Problem Relation Age of Onset  . Coronary artery disease      questionable in father  . Heart attack Mother 60  . Thyroid disease Mother   . Thyroid disease Sister   . Thyroid disease Brother   . Heart disease Maternal Grandmother   . Hypertension Maternal Grandmother   . Hyperlipidemia Maternal Grandmother    . Heart disease Maternal Grandfather   . Hypertension Maternal Grandfather   . Hyperlipidemia Maternal Grandfather   . Stroke Paternal Grandfather   . Thyroid disease Sister     No Known Allergies  Current outpatient prescriptions:aspirin 81 MG tablet, Take 81 mg by mouth daily.  , Disp: , Rfl: ;  atorvastatin (LIPITOR) 40 MG tablet, Take 40 mg by mouth daily.  , Disp: , Rfl: ;  baclofen (LIORESAL) 10 MG tablet, Take 10 mg by mouth 4 (four) times daily. , Disp: , Rfl: ;  cetirizine (ZYRTEC) 10 MG tablet, Take 10 mg by mouth as needed.  , Disp: , Rfl: ;  Dalfampridine (AMPYRA) 10 MG TB12, Take by mouth. Every 12 hours , Disp: , Rfl:  DULoxetine (CYMBALTA) 60 MG capsule, Take 1 capsule (60 mg total) by mouth 2 (two) times daily., Disp: 180 capsule, Rfl: 3;  LAMISIL 250 MG tablet, Take 1 tablet (250 mg total) by mouth daily., Disp: 30 tablet, Rfl: 1;  LORazepam (ATIVAN) 1 MG tablet, Take 1 tablet (1 mg total) by mouth every 6 (six) hours as needed., Disp: 90 tablet, Rfl: 2;  olmesartan (BENICAR) 20 MG tablet, Take 1 tablet (20 mg total) by mouth daily., Disp: 30 tablet, Rfl: 4 prasugrel (EFFIENT) 10 MG TABS, Take by mouth daily.  , Disp: ,  Rfl: ;  SYNTHROID 150 MCG tablet, TAKE 1 TABLET (150 MCG TOTAL) BY MOUTH DAILY., Disp: 30 tablet, Rfl: 1;  testosterone cypionate (DEPOTESTOTERONE CYPIONATE) 200 MG/ML injection, Inject 200 mg into the muscle every 14 (fourteen) days. Every week for 3 weeks than skip a week, Disp: , Rfl:   BP 117/62  Pulse 78  Resp 16  Ht 6\' 3"  (1.905 m)  Wt 295 lb (133.811 kg)  BMI 36.87 kg/m2  Body mass index is 36.87 kg/(m^2).        Review of Systems denies chest pain, dyspnea on exertion, PND, orthopnea. MS was diagnosed in 1998 walks with a cane. Does have spasticity which varies as the year progresses. No other symptoms in a complete review of systems     Objective:   Physical Exam blood pressure 170/60 heart rate 78 respirations 16 Gen.-alert and oriented x3  in no apparent distress HEENT normal for age Lungs no rhonchi or wheezing Cardiovascular regular rhythm no murmurs carotid pulses 3+ palpable no bruits audible Abdomen soft nontender no palpable masses Musculoskeletal free of  major deformities Skin clear -no rashes Neurologic spasticity in both lower extremities in the lower thigh and calf areas. Ambulates with a walker. Lower extremities 3+ femoral and dorsalis pedis pulses palpable bilaterally , right leg has 1-2+ edema from distal thigh to foot. Her bulging varicosities in the medial calf over the great saphenous system-a moderate cluster about 4 cm in diameter. Left leg is free of varicosities. There is no active ulceration.  Today I ordered lower extremity venous duplex exam which are reviewed and interpreted. The right great saphenous vein has gross reflux from the knee to the saphenofemoral junction and supplies these bulging varicosities. There is no DVT. Left leg has very mild reflux of the great saphenous vein but not a large saphenous vein. There is no DVT on the left. There is some reflux in the deep systems bilaterally.        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 #5 patient has history of coronary artery disease with PTCA and stents and previous MI. Left great saphenous vein should be preserved for possibility of coronary artery bypass grafting in the future

## 2012-02-13 ENCOUNTER — Other Ambulatory Visit (INDEPENDENT_AMBULATORY_CARE_PROVIDER_SITE_OTHER): Payer: Managed Care, Other (non HMO)

## 2012-02-13 DIAGNOSIS — I10 Essential (primary) hypertension: Secondary | ICD-10-CM

## 2012-02-13 LAB — HEPATIC FUNCTION PANEL
ALT: 25 U/L (ref 0–53)
AST: 17 U/L (ref 0–37)
Albumin: 3.9 g/dL (ref 3.5–5.2)
Alkaline Phosphatase: 53 U/L (ref 39–117)
Bilirubin, Direct: 0.1 mg/dL (ref 0.0–0.3)
Total Bilirubin: 0.3 mg/dL (ref 0.3–1.2)
Total Protein: 6.2 g/dL (ref 6.0–8.3)

## 2012-02-18 NOTE — Procedures (Unsigned)
LOWER EXTREMITY VENOUS REFLUX EXAM  INDICATION:  Bilateral varicose veins.  EXAM:  Using color-flow imaging and pulse Doppler spectral analysis, the right and left common femoral, superficial femoral, popliteal, posterior tibial, greater and lesser saphenous veins are evaluated.  There is evidence suggesting deep venous insufficiency in the right and left lower extremity.  The right and left saphenofemoral junction  is not competent with Reflux of >500 milliseconds. The right and left GSV is not competent with Reflux of  >565milliseconds with the caliber as described below.   The right and left proximal short saphenous vein demonstrates competency.  GSV Diameter (used if found to be incompetent only)                                                  Right      Left Proximal Greater Saphenous Vein                  1.07 cm    0.81 cm Proximal-to-mid-thigh                            0.71 cm    0.44 cm Mid thigh                                        0.64 cm    0.37 cm Mid-distal thigh                                 0.59 cm    0.44 cm Distal thigh                                     cm         cm Knee                                             0.54 cm    0.46 cm  IMPRESSION: 1. The right and left greater saphenous vein is not competent with     reflux of >500 milliseconds is identified with the caliber ranging     from ______ cm to _______ cm knee to groin. 2. The (right/left) greater saphenous vein (is/is not) aneurysmal. 3. The right and left greater saphenous vein is not tortuous. 4. The deep venous system is not competent with Reflux of     >571milliseconds. 5. The right and left lesser saphenous vein is competent with Reflux     of >534milliseconds.)  ___________________________________________ Quita Skye. Hart Rochester, M.D.  EM/MEDQ  D:  02/11/2012  T:  02/11/2012  Job:  [id number]

## 2012-02-27 ENCOUNTER — Other Ambulatory Visit: Payer: Self-pay | Admitting: Family Medicine

## 2012-02-27 NOTE — Telephone Encounter (Signed)
Noted pt seen in office 01-16-12, rx sent to pharmacy by e-script for synthroid and lamisl

## 2012-03-02 ENCOUNTER — Other Ambulatory Visit: Payer: Self-pay | Admitting: *Deleted

## 2012-03-02 MED ORDER — PRASUGREL HCL 10 MG PO TABS: 10.0000 mg | ORAL_TABLET | Freq: Every day | ORAL | Status: DC

## 2012-03-02 MED ORDER — ATORVASTATIN CALCIUM 40 MG PO TABS: 40.0000 mg | ORAL_TABLET | Freq: Every day | ORAL | Status: DC

## 2012-03-02 MED ORDER — METOPROLOL TARTRATE 25 MG PO TABS: 25.0000 mg | ORAL_TABLET | Freq: Two times a day (BID) | ORAL | Status: DC

## 2012-03-04 ENCOUNTER — Other Ambulatory Visit: Payer: Self-pay | Admitting: *Deleted

## 2012-04-06 ENCOUNTER — Other Ambulatory Visit: Payer: Self-pay | Admitting: Family Medicine

## 2012-04-06 MED ORDER — OLMESARTAN MEDOXOMIL 20 MG PO TABS: 20.0000 mg | ORAL_TABLET | Freq: Every day | ORAL | Status: DC

## 2012-04-06 NOTE — Telephone Encounter (Signed)
RX faxed

## 2012-04-08 ENCOUNTER — Other Ambulatory Visit (HOSPITAL_COMMUNITY): Payer: Managed Care, Other (non HMO) | Admitting: Radiology

## 2012-04-08 ENCOUNTER — Ambulatory Visit (HOSPITAL_COMMUNITY): Payer: Managed Care, Other (non HMO) | Attending: Cardiology | Admitting: Radiology

## 2012-04-08 VITALS — BP 112/75 | Ht 75.0 in | Wt 297.0 lb

## 2012-04-08 DIAGNOSIS — Z8249 Family history of ischemic heart disease and other diseases of the circulatory system: Secondary | ICD-10-CM | POA: Insufficient documentation

## 2012-04-08 DIAGNOSIS — E785 Hyperlipidemia, unspecified: Secondary | ICD-10-CM | POA: Insufficient documentation

## 2012-04-08 DIAGNOSIS — I10 Essential (primary) hypertension: Secondary | ICD-10-CM | POA: Insufficient documentation

## 2012-04-08 DIAGNOSIS — E669 Obesity, unspecified: Secondary | ICD-10-CM | POA: Insufficient documentation

## 2012-04-08 DIAGNOSIS — I2510 Atherosclerotic heart disease of native coronary artery without angina pectoris: Secondary | ICD-10-CM | POA: Insufficient documentation

## 2012-04-08 MED ORDER — TECHNETIUM TC 99M TETROFOSMIN IV KIT
11.0000 | PACK | Freq: Once | INTRAVENOUS | Status: AC | PRN
  Administered 2012-04-08: 11 via INTRAVENOUS

## 2012-04-08 MED ORDER — TECHNETIUM TC 99M TETROFOSMIN IV KIT
33.0000 | PACK | Freq: Once | INTRAVENOUS | Status: AC | PRN
  Administered 2012-04-08: 33 via INTRAVENOUS

## 2012-04-08 MED ORDER — REGADENOSON 0.4 MG/5ML IV SOLN
0.4000 mg | Freq: Once | INTRAVENOUS | Status: AC
  Administered 2012-04-08: 0.4 mg via INTRAVENOUS

## 2012-04-08 NOTE — Progress Notes (Signed)
[facility] SITE 3 NUCLEAR MED [address] [place] [address] [phone]  Cardiology Nuclear Med Study  Keith Morrow is a 41 y.o. male     MRN : [id number]     DOB: [birth year]  Procedure Date: 04/08/2012  Nuclear Med Background Indication for Stress Test:  Evaluation for Ischemia and Stent Patency History:  5/12 NSTEMI>Stent-LAD/RCA, EF=55%. Cardiac Risk Factors: Family History - CAD, Hypertension, Lipids and Obesity  Symptoms:  No cardiac complaints, f/u CAD.   Nuclear Pre-Procedure Caffeine/Decaff Intake:  8:00pm NPO After: 8:00am   Lungs:  Clear. O2 Sat: 98% on room air. IV 0.9% NS with Angio Cath:  20g  IV Site: R Antecubital x 1, tolerated well IV Started by:  Irean Hong, RN  Chest Size (in):  52 Cup Size: n/a  Height: 6\' 3"  (1.905 m)  Weight:  297 lb (134.718 kg)  BMI:  Body mass index is 37.12 kg/(m^2). Tech Comments:  Lopressor not held this am as directed    Nuclear Med Study 1 or 2 day study: 1 day  Stress Test Type:  Lexiscan  Reading MD: [name], MD  Order Authorizing Provider:  Tonny Bollman, MD  Resting Radionuclide: Technetium 102m Tetrofosmin  Resting Radionuclide Dose: 11.0 mCi   Stress Radionuclide:  Technetium 35m Tetrofosmin  Stress Radionuclide Dose: 33.0 mCi           Stress Protocol Rest HR: 63 Stress HR: 91  Rest BP: 112/75 Stress BP: 112/70  Exercise Time (min): n/a METS: n/a   Predicted Max HR: 181 bpm % Max HR: 50.28 bpm Rate Pressure Product: 28413   Dose of Adenosine (mg):  n/a Dose of Lexiscan: 0.4 mg  Dose of Atropine (mg): n/a Dose of Dobutamine: n/a mcg/kg/min (at max HR)  Stress Test Technologist: Smiley Houseman, CMA-N  Nuclear Technologist:  Domenic Polite, CNMT     Rest Procedure:  Myocardial perfusion imaging was performed at rest 45 minutes following the intravenous administration of Technetium 80m Tetrofosmin.  Rest ECG: No acute changes  Stress Procedure:  The patient received IV  Lexiscan 0.4 mg over 15-seconds.  Technetium 73m Tetrofosmin injected at 30-seconds.  There were no significant changes with Lexiscan bolus.  He did c/o neck tightness, 3/10, with lexiscan.  Quantitative spect images were obtained after a 45 minute delay.  Stress ECG: No significant change from baseline ECG  QPS Raw Data Images:  Normal; no motion artifact; normal heart/lung ratio. Stress Images:  Normal homogeneous uptake in all areas of the myocardium. Rest Images:  Normal homogeneous uptake in all areas of the myocardium. Subtraction (SDS):  No evidence of ischemia. Transient Ischemic Dilatation (Normal <1.22):  1.12 Lung/Heart Ratio (Normal <0.45):  0.33  Quantitative Gated Spect Images QGS EDV:  144 ml QGS ESV:  66 ml  Impression Exercise Capacity:  Lexiscan with no exercise. BP Response:  Normal blood pressure response. Clinical Symptoms:  No chest pain. ECG Impression:  No significant ST segment change suggestive of ischemia. Comparison with Prior Nuclear Study: No previous nuclear study performed  Overall Impression:  Normal stress nuclear study.  LV Ejection Fraction: 54%.  LV Wall Motion:  NL LV Function; NL Wall Motion  [employer]

## 2012-04-09 ENCOUNTER — Other Ambulatory Visit: Payer: Managed Care, Other (non HMO)

## 2012-04-13 ENCOUNTER — Encounter: Payer: Self-pay | Admitting: Cardiovascular Disease

## 2012-04-13 NOTE — Telephone Encounter (Signed)
Left message to call back  

## 2012-04-13 NOTE — Telephone Encounter (Signed)
New Problem:    Patient called returning your call.  Please call back. 

## 2012-04-13 NOTE — Telephone Encounter (Signed)
This encounter was created in error - please disregard.

## 2012-05-05 ENCOUNTER — Other Ambulatory Visit: Payer: Self-pay

## 2012-05-05 MED ORDER — OLMESARTAN MEDOXOMIL 20 MG PO TABS: 20.0000 mg | ORAL_TABLET | Freq: Every day | ORAL | Status: DC

## 2012-05-05 MED ORDER — SYNTHROID 150 MCG PO TABS: 150.0000 ug | ORAL_TABLET | Freq: Every day | ORAL | Status: DC

## 2012-05-07 ENCOUNTER — Ambulatory Visit: Payer: Managed Care, Other (non HMO) | Admitting: Family Medicine

## 2012-05-10 ENCOUNTER — Encounter: Payer: Self-pay | Admitting: Vascular Surgery

## 2012-05-10 ENCOUNTER — Ambulatory Visit (INDEPENDENT_AMBULATORY_CARE_PROVIDER_SITE_OTHER): Payer: Managed Care, Other (non HMO) | Admitting: Family Medicine

## 2012-05-10 ENCOUNTER — Encounter: Payer: Self-pay | Admitting: Family Medicine

## 2012-05-10 VITALS — BP 108/72 | HR 72 | Temp 98.2°F | Ht 76.0 in | Wt 299.8 lb

## 2012-05-10 DIAGNOSIS — I10 Essential (primary) hypertension: Secondary | ICD-10-CM

## 2012-05-10 DIAGNOSIS — M25519 Pain in unspecified shoulder: Secondary | ICD-10-CM

## 2012-05-10 DIAGNOSIS — G35 Multiple sclerosis: Secondary | ICD-10-CM

## 2012-05-10 DIAGNOSIS — I2510 Atherosclerotic heart disease of native coronary artery without angina pectoris: Secondary | ICD-10-CM

## 2012-05-10 DIAGNOSIS — E669 Obesity, unspecified: Secondary | ICD-10-CM

## 2012-05-10 DIAGNOSIS — M62838 Other muscle spasm: Secondary | ICD-10-CM

## 2012-05-10 DIAGNOSIS — I83893 Varicose veins of bilateral lower extremities with other complications: Secondary | ICD-10-CM

## 2012-05-10 NOTE — Patient Instructions (Signed)

## 2012-05-10 NOTE — Assessment & Plan Note (Signed)
Has appt with vascular tomorrow for further evaluation and treatment

## 2012-05-10 NOTE — Assessment & Plan Note (Signed)
stable °

## 2012-05-10 NOTE — Assessment & Plan Note (Signed)
Patient very discouraged with weight gain. Encouraged DASH diet and portion control. Unfortunately his mobility is severely impaired due to his MS. He can consider gastric bypass vs a course of Phentermine

## 2012-05-10 NOTE — Assessment & Plan Note (Signed)
Patient struggling with increasing spasticity and dysfunction especially in right leg. Now is having increasing shoulder pain and stiffness since relying on his cane more for stability. We will send him for a physiatry consultation and PT to see if we can maximize his strength and minimize debility

## 2012-05-10 NOTE — Progress Notes (Signed)
Patient ID: Keith Morrow, male   DOB: [birth year], 41 y.o.   MRN: [id number] Keith Morrow [id number] Apr 30, 1971 05/10/2012      Progress Note-Follow Up  Subjective  Chief Complaint  Chief Complaint  Patient presents with  . Follow-up    HPI  This is a 41 year old Caucasian male in today for a frustrated with weight gain. He eats the right and portions. Because of his MS he has exercising. He's having increasing spasticity and difficulty with his right leg. This is leading to an unsteady gait and decreased movement. Is also more pain and discomfort in his right shoulder and right sternocleidomastoid muscle. No other acute complaint. Has stopped taking Prilosec hoping that will keep him from eating but it has not. No chest pain, palpitations, shortness of breath, GI or GU complaints noted today appear  Past Medical History  Diagnosis Date  . HTN (hypertension)   . Hypothyroidism   . Hyperlipidemia   . MS (multiple sclerosis)   . Coronary atherosclerosis of native coronary artery     a. NSTEMI 04/20/11: tx with Promus DES to LAD; bifurcation lesion with oDx 90% tx with POBA; b. staged PCI of right post. AV Branch with Promus DES; c. residual at cath 04/21/11:  AV groove CFX 50%, mRCA 30%,; EF 55%  . Chicken pox as a child  . Heart attack 04-20-11  . Allergy   . Obesity   . Depression   . Onychomycosis   . Tinea pedis   . Keloid scar   . Preventative health care 10/07/2011  . Reflux 10/07/2011  . 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 Hypotestosteronism 11/17/2011  . Dermatitis, contact 12/10/2011  . Bronchitis, acute 12/10/2011  . Testosterone deficiency 01/16/2012    Past Surgical History  Procedure Date  . Right wrist surgery     CTR  . Vasectomy   . Coronary angioplasty with stent placement     Family History  Problem Relation Age of Onset  . Coronary artery disease      questionable in father  . Heart attack Mother 55  . Thyroid disease Mother   . Thyroid  disease Sister   . Thyroid disease Brother   . Heart disease Maternal Grandmother   . Hypertension Maternal Grandmother   . Hyperlipidemia Maternal Grandmother   . Heart disease Maternal Grandfather   . Hypertension Maternal Grandfather   . Hyperlipidemia Maternal Grandfather   . Stroke Paternal Grandfather   . Thyroid disease Sister     History   Social History  . Marital Status: Married    Spouse Name: N/A    Number of Children: 3  . Years of Education: N/A   Occupational History  . Not on file.   Social History Main Topics  . Smoking status: Never Smoker   . Smokeless tobacco: Never Used  . Alcohol Use: Yes     socially  . Drug Use: No  . Sexually Active: Yes -- Male partner(s)   Other Topics Concern  . Not on file   Social History Narrative  . No narrative on file    Current Outpatient Prescriptions on File Prior to Visit  Medication Sig Dispense Refill  . aspirin 81 MG tablet Take 81 mg by mouth daily.        Marland Kitchen atorvastatin (LIPITOR) 40 MG tablet Take 1 tablet (40 mg total) by mouth daily.  30 tablet  10  . baclofen (LIORESAL) 10 MG tablet Take 10 mg by  mouth 4 (four) times daily.       . cetirizine (ZYRTEC) 10 MG tablet Take 10 mg by mouth as needed.        . Dalfampridine (AMPYRA) 10 MG TB12 Take by mouth. Every 12 hours       . DULoxetine (CYMBALTA) 60 MG capsule Take 1 capsule (60 mg total) by mouth 2 (two) times daily.  180 capsule  3  . LAMISIL 250 MG tablet TAKE 1 TABLET (250 MG TOTAL) BY MOUTH DAILY.  30 tablet  1  . LORazepam (ATIVAN) 1 MG tablet Take 1 tablet (1 mg total) by mouth every 6 (six) hours as needed.  90 tablet  2  . metoprolol tartrate (LOPRESSOR) 25 MG tablet Take 1 tablet (25 mg total) by mouth 2 (two) times daily.  60 tablet  10  . olmesartan (BENICAR) 20 MG tablet Take 1 tablet (20 mg total) by mouth daily.  30 tablet  4  . SYNTHROID 150 MCG tablet Take 1 tablet (150 mcg total) by mouth daily.  30 tablet  3  . testosterone cypionate  (DEPOTESTOTERONE CYPIONATE) 200 MG/ML injection Inject 200 mg into the muscle every 14 (fourteen) days. Every week for 3 weeks than skip a week        No Known Allergies  Review of Systems  Review of Systems  Constitutional: Positive for malaise/fatigue. Negative for fever.  HENT: Positive for neck pain. Negative for congestion.   Eyes: Negative for discharge.  Respiratory: Negative for shortness of breath.   Cardiovascular: Negative for chest pain, palpitations and leg swelling.  Gastrointestinal: Negative for nausea, abdominal pain and diarrhea.  Genitourinary: Negative for dysuria.  Musculoskeletal: Positive for myalgias and joint pain. Negative for falls.  Skin: Negative for rash.  Neurological: Positive for focal weakness. Negative for loss of consciousness and headaches.  Endo/Heme/Allergies: Negative for polydipsia.  Psychiatric/Behavioral: Negative for depression and suicidal ideas. The patient is not nervous/anxious and does not have insomnia.     Objective  BP 108/72  Pulse 72  Temp(Src) 98.2 F (36.8 C) (Temporal)  Ht 6\' 4"  (1.93 m)  Wt 299 lb 12.8 oz (135.988 kg)  BMI 36.49 kg/m2  SpO2 98%  Physical Exam  Physical Exam  Constitutional: He is oriented to person, place, and time and well-developed, well-nourished, and in no distress. No distress.  HENT:  Head: Normocephalic and atraumatic.  Eyes: Conjunctivae are normal.  Neck: Neck supple. No thyromegaly present.  Cardiovascular: Normal rate, regular rhythm and normal heart sounds.   No murmur heard. Pulmonary/Chest: Effort normal and breath sounds normal. No respiratory distress.  Abdominal: He exhibits no distension and no mass. There is no tenderness.  Musculoskeletal: He exhibits no edema.  Neurological: He is alert and oriented to person, place, and time. Coordination abnormal.       Walks with unstable gait, uses cane  Skin: Skin is warm.  Psychiatric: Memory, affect and judgment normal.    Lab  Results  Component Value Date   TSH 1.29 11/03/2011   Lab Results  Component Value Date   WBC 8.2 11/03/2011   HGB 14.7 11/03/2011   HCT 43.7 11/03/2011   MCV 89.8 11/03/2011   PLT 221.0 11/03/2011   Lab Results  Component Value Date   CREATININE 1.0 11/03/2011   BUN 16 11/03/2011   NA 142 11/03/2011   K 4.2 11/03/2011   CL 105 11/03/2011   CO2 29 11/03/2011   Lab Results  Component Value Date   ALT  25 02/13/2012   AST 17 02/13/2012   ALKPHOS 53 02/13/2012   BILITOT 0.3 02/13/2012   Lab Results  Component Value Date   CHOL 146 11/03/2011   Lab Results  Component Value Date   HDL 55.40 11/03/2011   Lab Results  Component Value Date   LDLCALC 80 11/03/2011   Lab Results  Component Value Date   TRIG 53.0 11/03/2011   Lab Results  Component Value Date   CHOLHDL 3 11/03/2011     Assessment & Plan  HTN (hypertension) Well controlled, no changes  Varicose veins of lower extremities with other complications Has appt with vascular tomorrow for further evaluation and treatment  MS (multiple sclerosis) Patient struggling with increasing spasticity and dysfunction especially in right leg. Now is having increasing shoulder pain and stiffness since relying on his cane more for stability. We will send him for a physiatry consultation and PT to see if we can maximize his strength and minimize debility  Coronary atherosclerosis of native coronary artery stable  Obesity Patient very discouraged with weight gain. Encouraged DASH diet and portion control. Unfortunately his mobility is severely impaired due to his MS. He can consider gastric bypass vs a course of Phentermine

## 2012-05-10 NOTE — Assessment & Plan Note (Signed)
Well controlled, no changes 

## 2012-05-11 ENCOUNTER — Ambulatory Visit (INDEPENDENT_AMBULATORY_CARE_PROVIDER_SITE_OTHER): Payer: Managed Care, Other (non HMO) | Admitting: Vascular Surgery

## 2012-05-11 ENCOUNTER — Encounter: Payer: Self-pay | Admitting: Vascular Surgery

## 2012-05-11 VITALS — BP 126/75 | HR 80 | Resp 20 | Ht 76.0 in | Wt 300.0 lb

## 2012-05-11 DIAGNOSIS — I83893 Varicose veins of bilateral lower extremities with other complications: Secondary | ICD-10-CM

## 2012-05-11 NOTE — Progress Notes (Signed)
Subjective:     Patient ID: Keith Morrow, male   DOB: [birth year], 41 y.o.   MRN: [id number]  HPI this 41 year old male patient returns today for continued followup regarding his severe venous insufficiency of the right leg. This patient has multiple sclerosis has been having worsening swelling in the right leg with bluish congestion of the right foot. He has had Varicosities for the past 20 years which have become much larger. The swelling has become much more significant in his right ankle and foot his MS causes spasticity and discomfort bilaterally he has tried long-leg elastic compression stockings-20-30 mm gradient as well as elevation and ibuprofen with no improvement. He continues to have significant swelling.  Past Medical History  Diagnosis Date  . HTN (hypertension)   . Hypothyroidism   . Hyperlipidemia   . MS (multiple sclerosis)   . Coronary atherosclerosis of native coronary artery     a. NSTEMI 04/20/11: tx with Promus DES to LAD; bifurcation lesion with oDx 90% tx with POBA; b. staged PCI of right post. AV Branch with Promus DES; c. residual at cath 04/21/11:  AV groove CFX 50%, mRCA 30%,; EF 55%  . Chicken pox as a child  . Heart attack 04-20-11  . Allergy   . Obesity   . Depression   . Onychomycosis   . Tinea pedis   . Keloid scar   . Preventative health care 10/07/2011  . Reflux 10/07/2011  . 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 Hypotestosteronism 11/17/2011  . Dermatitis, contact 12/10/2011  . Bronchitis, acute 12/10/2011  . Testosterone deficiency 01/16/2012    History  Substance Use Topics  . Smoking status: Never Smoker   . Smokeless tobacco: Never Used  . Alcohol Use: Yes     socially    Family History  Problem Relation Age of Onset  . Coronary artery disease      questionable in father  . Heart attack Mother 54  . Thyroid disease Mother   . Thyroid disease Sister   . Thyroid disease Brother   . Heart disease Maternal Grandmother   . Hypertension  Maternal Grandmother   . Hyperlipidemia Maternal Grandmother   . Heart disease Maternal Grandfather   . Hypertension Maternal Grandfather   . Hyperlipidemia Maternal Grandfather   . Stroke Paternal Grandfather   . Thyroid disease Sister     No Known Allergies  Current outpatient prescriptions:aspirin 81 MG tablet, Take 81 mg by mouth daily.  , Disp: , Rfl: ;  atorvastatin (LIPITOR) 40 MG tablet, Take 1 tablet (40 mg total) by mouth daily., Disp: 30 tablet, Rfl: 10;  baclofen (LIORESAL) 10 MG tablet, Take 10 mg by mouth 4 (four) times daily. , Disp: , Rfl: ;  cetirizine (ZYRTEC) 10 MG tablet, Take 10 mg by mouth as needed.  , Disp: , Rfl:  Dalfampridine (AMPYRA) 10 MG TB12, Take by mouth. Every 12 hours , Disp: , Rfl: ;  DULoxetine (CYMBALTA) 60 MG capsule, Take 1 capsule (60 mg total) by mouth 2 (two) times daily., Disp: 180 capsule, Rfl: 3;  LAMISIL 250 MG tablet, TAKE 1 TABLET (250 MG TOTAL) BY MOUTH DAILY., Disp: 30 tablet, Rfl: 1;  LORazepam (ATIVAN) 1 MG tablet, Take 1 tablet (1 mg total) by mouth every 6 (six) hours as needed., Disp: 90 tablet, Rfl: 2 metoprolol tartrate (LOPRESSOR) 25 MG tablet, Take 1 tablet (25 mg total) by mouth 2 (two) times daily., Disp: 60 tablet, Rfl: 10;  olmesartan (BENICAR) 20  MG tablet, Take 1 tablet (20 mg total) by mouth daily., Disp: 30 tablet, Rfl: 4;  SYNTHROID 150 MCG tablet, Take 1 tablet (150 mcg total) by mouth daily., Disp: 30 tablet, Rfl: 3 testosterone cypionate (DEPOTESTOTERONE CYPIONATE) 200 MG/ML injection, Inject 200 mg into the muscle every 14 (fourteen) days. Every week for 3 weeks than skip a week, Disp: , Rfl: ;  EFFIENT 10 MG TABS, , Disp: , Rfl:   BP 126/75  Pulse 80  Resp 20  Ht 6\' 4"  (1.93 m)  Wt 300 lb (136.079 kg)  BMI 36.52 kg/m2  Body mass index is 36.52 kg/(m^2).           Review of Systems denies chest pain, dyspnea on exertion, PND, orthopnea, and hemoptysis.    Objective:   Physical Exam blood pressure 126/75  heart rate 80 respirations 20 General well-developed well-nourished male in no apparent distress alert and oriented x3 Lungs no rhonchi or wheezing Right lower extremity with a large patch of bulging varicosities in the medial calf over the great saphenous system with 1-2+ edema from the mid calf to the foot. There is no hyperpigmentation or active ulceration noted. Left leg has no edema of the     Assessment:     Patient has gross reflux in right great saphenous vein supplying bulging varicosities which caused distal edema which is worsening despite conservative measures    Plan:     Patient needs a laser ablation right great saphenous vein with 10-20 stab phlebectomy at same procedure. Will proceed with precertification to perform this in the near future

## 2012-05-31 ENCOUNTER — Ambulatory Visit: Payer: Managed Care, Other (non HMO) | Attending: Family Medicine | Admitting: Occupational Therapy

## 2012-05-31 ENCOUNTER — Ambulatory Visit: Payer: Managed Care, Other (non HMO) | Admitting: Occupational Therapy

## 2012-05-31 DIAGNOSIS — M6281 Muscle weakness (generalized): Secondary | ICD-10-CM | POA: Insufficient documentation

## 2012-05-31 DIAGNOSIS — R279 Unspecified lack of coordination: Secondary | ICD-10-CM | POA: Insufficient documentation

## 2012-05-31 DIAGNOSIS — R5381 Other malaise: Secondary | ICD-10-CM | POA: Insufficient documentation

## 2012-05-31 DIAGNOSIS — IMO0001 Reserved for inherently not codable concepts without codable children: Secondary | ICD-10-CM | POA: Insufficient documentation

## 2012-06-03 ENCOUNTER — Other Ambulatory Visit: Payer: Self-pay

## 2012-06-03 MED ORDER — LAMISIL 250 MG PO TABS: 250.0000 mg | ORAL_TABLET | Freq: Every day | ORAL | Status: DC

## 2012-06-07 ENCOUNTER — Ambulatory Visit: Payer: Managed Care, Other (non HMO) | Admitting: Physical Therapy

## 2012-06-07 ENCOUNTER — Ambulatory Visit: Payer: Managed Care, Other (non HMO) | Attending: Family Medicine | Admitting: Occupational Therapy

## 2012-06-07 DIAGNOSIS — R279 Unspecified lack of coordination: Secondary | ICD-10-CM | POA: Insufficient documentation

## 2012-06-07 DIAGNOSIS — M6281 Muscle weakness (generalized): Secondary | ICD-10-CM | POA: Insufficient documentation

## 2012-06-07 DIAGNOSIS — R5381 Other malaise: Secondary | ICD-10-CM | POA: Insufficient documentation

## 2012-06-07 DIAGNOSIS — IMO0001 Reserved for inherently not codable concepts without codable children: Secondary | ICD-10-CM | POA: Insufficient documentation

## 2012-06-11 ENCOUNTER — Encounter: Payer: Self-pay | Admitting: Vascular Surgery

## 2012-06-14 ENCOUNTER — Ambulatory Visit (INDEPENDENT_AMBULATORY_CARE_PROVIDER_SITE_OTHER): Payer: Managed Care, Other (non HMO) | Admitting: Vascular Surgery

## 2012-06-14 ENCOUNTER — Encounter: Payer: Self-pay | Admitting: Vascular Surgery

## 2012-06-14 VITALS — BP 129/76 | HR 80 | Resp 16 | Ht 75.0 in | Wt 297.0 lb

## 2012-06-14 DIAGNOSIS — I83893 Varicose veins of bilateral lower extremities with other complications: Secondary | ICD-10-CM

## 2012-06-14 NOTE — Progress Notes (Signed)
Subjective:     Patient ID: Keith Morrow, male   DOB: 04-30-1971, 41 y.o.   MRN: [id number]  HPI this 40 year old male patient had laser ablation of the right great saphenous vein with 10-20 stab phlebectomy performed under local tumescent anesthesia. He tolerated the procedure well. A total of 2590 J of energy was utilized.  Review of Systems     Objective:   Physical ExamBP 129/76  Pulse 80  Resp 16  Ht 6\' 3"  (1.905 m)  Wt 297 lb (134.718 kg)  BMI 37.12 kg/m2      Assessment:     Well-tolerated laser ablation right great saphenous vein with 10-20 stab phlebectomy for secondary varicosities performed under local tumescent anesthesia    Plan:     Return on July 23 for venous duplex exam to confirm closure right great saphenous vein

## 2012-06-14 NOTE — Progress Notes (Signed)
Laser Ablation Procedure      Date: 06/14/2012    Keith Morrow DOB:1971/02/20  Consent signed: Yes  Surgeon:J.D. Hart Rochester  Procedure: Laser Ablation: right Greater Saphenous Vein  BP 129/76  Pulse 80  Resp 16  Ht 6\' 3"  (1.905 m)  Wt 297 lb (134.718 kg)  BMI 37.12 kg/m2  Start time: 1:05   End time: 2:00  Tumescent Anesthesia: 425 cc 0.9% NaCl with 50 cc Lidocaine HCL with 1% Epi and 15 cc 8.4% NaHCO3  Local Anesthesia: 10 cc Lidocaine HCL and NaHCO3 (ratio 2:1)  Pulsed mode: Watts 15 Seconds 1 Pulses:1 Total Pulses:173 Total Energy: 2595 Total Time: 2:53     Stab Phlebectomy: 10-20 Sites: Thigh and Calf  Patient tolerated procedure well: Yes    Description of Procedure:  After marking the course of the saphenous vein and the secondary varicosities in the standing position, the patient was placed on the operating table in the supine position, and the right leg was prepped and draped in sterile fashion. Local anesthetic was administered, and under ultrasound guidance the saphenous vein was accessed with a micro needle and guide wire; then the micro puncture sheath was placed. A guide wire was inserted to the saphenofemoral junction, followed by a 5 french sheath.  The position of the sheath and then the laser fiber below the junction was confirmed using the ultrasound and visualization of the aiming beam.  Tumescent anesthesia was administered along the course of the saphenous vein using ultrasound guidance. Protective laser glasses were placed on the patient, and the laser was fired at 15 watt pulsed mode advancing 1-2 mm per sec.  For a total of 2595 joules.  A steri strip was applied to the puncture site.  The patient was then put into Trendelenburg position.  Local anesthetic was utilized overlying the marked varicosities.  Ten to 20 stab wounds were made using the tip of an 11 blade; and using the vein hook,  The phlebectomies were performed using a hemostat to avulse these  varicosities.  Adequate hemostasis was achieved, and steri strips were applied to the stab wound.     ABD pads and thigh high compression stockings were applied.  Ace wrap bandages were applied over the phlebectomy sites and at the top of the saphenofemoral junction.  Blood loss was less than 15 cc.  The patient ambulated out of the operating room having tolerated the procedure well.

## 2012-06-15 ENCOUNTER — Encounter: Payer: Self-pay | Admitting: Vascular Surgery

## 2012-06-15 ENCOUNTER — Telehealth: Payer: Self-pay | Admitting: *Deleted

## 2012-06-15 ENCOUNTER — Ambulatory Visit: Payer: Managed Care, Other (non HMO) | Admitting: Occupational Therapy

## 2012-06-15 NOTE — Telephone Encounter (Signed)
Left a vm with instructions and asked him to call me if there are any questions or concerns.

## 2012-06-22 ENCOUNTER — Telehealth: Payer: Self-pay | Admitting: *Deleted

## 2012-06-22 ENCOUNTER — Other Ambulatory Visit: Payer: Self-pay | Admitting: *Deleted

## 2012-06-22 DIAGNOSIS — I83893 Varicose veins of bilateral lower extremities with other complications: Secondary | ICD-10-CM

## 2012-06-22 NOTE — Telephone Encounter (Signed)
Keith Morrow is s/p endovenous laser ablation right greater saphenous vein and stab phlebectomy 10-20 incisions 06-14-2012.  Responding to his question about starting physical therapy tomorrow (06-23-2012) involving his lower extremities.  Keith Morrow states he is having a "pulling,tightening sensation" in his right leg and thought physical therapy would be beneficial.  Encouraged Keith Morrow to defer physical therapy until after seeing Dr. Hart Rochester and having post laser ablation duplex on 06-28-2012.  Encouraged him to continue wearing compression stockings, use ice compress to affected area prn pain/discomfort, elevate right leg frequently, and take Ibuprofen 600 mg po prn leg pain.  Keith Morrow to call [facility] for further questions or concerns or if his symptoms worsen.  [name], [name]

## 2012-06-23 ENCOUNTER — Ambulatory Visit: Payer: Managed Care, Other (non HMO) | Admitting: Physical Therapy

## 2012-06-23 ENCOUNTER — Ambulatory Visit: Payer: Managed Care, Other (non HMO) | Admitting: Occupational Therapy

## 2012-06-28 ENCOUNTER — Encounter: Payer: Self-pay | Admitting: Vascular Surgery

## 2012-06-28 ENCOUNTER — Ambulatory Visit: Payer: Managed Care, Other (non HMO) | Admitting: Occupational Therapy

## 2012-06-29 ENCOUNTER — Ambulatory Visit (INDEPENDENT_AMBULATORY_CARE_PROVIDER_SITE_OTHER): Payer: Managed Care, Other (non HMO) | Admitting: Vascular Surgery

## 2012-06-29 ENCOUNTER — Encounter: Payer: Self-pay | Admitting: Vascular Surgery

## 2012-06-29 VITALS — BP 123/75 | HR 71 | Resp 16 | Ht 75.0 in | Wt 289.0 lb

## 2012-06-29 DIAGNOSIS — M7989 Other specified soft tissue disorders: Secondary | ICD-10-CM

## 2012-06-29 DIAGNOSIS — I83893 Varicose veins of bilateral lower extremities with other complications: Secondary | ICD-10-CM

## 2012-06-29 DIAGNOSIS — Z48812 Encounter for surgical aftercare following surgery on the circulatory system: Secondary | ICD-10-CM

## 2012-06-29 DIAGNOSIS — M79609 Pain in unspecified limb: Secondary | ICD-10-CM

## 2012-06-29 NOTE — Progress Notes (Signed)
Subjective:     Patient ID: Keith Morrow, male   DOB: 1971-04-24, 41 y.o.   MRN: [id number]  HPI this 41 year old male returns 2 weeks post laser ablation right great saphenous vein with multiple stab phlebectomy for painful varicosities. He has had no significant pain in the past week. He did have some mild-to-moderate discomfort in the medial thigh up to the saphenofemoral junction the first few days. He had some edema for a few days which is now returned to baseline. He is wearing his elastic compression stocking.  Review of Systems     Objective:   Physical ExamBP 123/75  Pulse 71  Resp 16  Ht 6\' 3"  (1.905 m)  Wt 289 lb (131.09 kg)  BMI 36.12 kg/m2  General alert and oriented x3 in no apparent distress Right lower extremity with 3+ dorsalis pedis pulse palpable. Stab phlebectomy sites healing nicely. All tenderness along the course of the great saphenous vein with no inflammation  Today I ordered a venous duplex exam of the right leg which are reviewed and interpreted. The right great saphenous vein is totally ablated and there is no DVT.    Assessment:     Successful and well-tolerated laser ablation right great saphenous vein for venous hypertension with painful varicosities    Plan:     Return to see [place] on when necessary basis Use elastic compression stockings as necessary

## 2012-06-29 NOTE — Progress Notes (Signed)
Right venous duplex performed @ [facility] 06/29/2012

## 2012-06-30 ENCOUNTER — Ambulatory Visit: Payer: Managed Care, Other (non HMO) | Admitting: Physical Therapy

## 2012-07-05 NOTE — Procedures (Unsigned)
DUPLEX DEEP VENOUS EXAM - LOWER EXTREMITY  INDICATION:  Right varicose veins, pain, edema.  HISTORY:  Edema:  Yes. Trauma/Surgery:  Right GSV endovenous laser ablation, 06/14/2012. Pain:  Yes. PE:  No. Previous DVT:  No. Anticoagulants:  No. Other:  DUPLEX EXAM:               CFV   SFV   PopV  PTV    GSV               R  L  R  L  R  L  R   L  R  L Thrombosis    o     o     o     o      + Spontaneous   +     +     +     +      o Phasic        +     +     +     +      o Augmentation  +     +     +     +      o Compressible  +     +     +     +      o Competent     +     o     +     +  Legend:  + - yes  o - no  p - partial  D - decreased  IMPRESSION: 1. No evidence of deep venous thrombosis involving the right lower     extremity. 2. Good post-ablation results the length of the right greater     saphenous vein ablated.   _____________________________ Quita Skye. Hart Rochester, M.D.  SH/MEDQ  D:  06/29/2012  T:  06/29/2012  Job:  [id number]

## 2012-07-08 ENCOUNTER — Ambulatory Visit: Payer: Managed Care, Other (non HMO) | Attending: Family Medicine | Admitting: Physical Therapy

## 2012-07-08 ENCOUNTER — Encounter: Payer: Managed Care, Other (non HMO) | Admitting: Occupational Therapy

## 2012-07-08 ENCOUNTER — Other Ambulatory Visit (INDEPENDENT_AMBULATORY_CARE_PROVIDER_SITE_OTHER): Payer: Managed Care, Other (non HMO)

## 2012-07-08 DIAGNOSIS — G629 Polyneuropathy, unspecified: Secondary | ICD-10-CM

## 2012-07-08 DIAGNOSIS — I10 Essential (primary) hypertension: Secondary | ICD-10-CM

## 2012-07-08 DIAGNOSIS — M6281 Muscle weakness (generalized): Secondary | ICD-10-CM | POA: Insufficient documentation

## 2012-07-08 DIAGNOSIS — R279 Unspecified lack of coordination: Secondary | ICD-10-CM | POA: Insufficient documentation

## 2012-07-08 DIAGNOSIS — R5381 Other malaise: Secondary | ICD-10-CM | POA: Insufficient documentation

## 2012-07-08 DIAGNOSIS — IMO0001 Reserved for inherently not codable concepts without codable children: Secondary | ICD-10-CM | POA: Insufficient documentation

## 2012-07-08 LAB — HEPATIC FUNCTION PANEL
ALT: 35 U/L (ref 0–53)
AST: 24 U/L (ref 0–37)
Albumin: 4.1 g/dL (ref 3.5–5.2)
Alkaline Phosphatase: 57 U/L (ref 39–117)
Bilirubin, Direct: 0 mg/dL (ref 0.0–0.3)
Total Bilirubin: 0.5 mg/dL (ref 0.3–1.2)
Total Protein: 6.7 g/dL (ref 6.0–8.3)

## 2012-07-08 LAB — RENAL FUNCTION PANEL
Albumin: 4.1 g/dL (ref 3.5–5.2)
BUN: 20 mg/dL (ref 6–23)
CO2: 30 mEq/L (ref 19–32)
Calcium: 9.2 mg/dL (ref 8.4–10.5)
Chloride: 103 mEq/L (ref 96–112)
Creatinine, Ser: 1 mg/dL (ref 0.4–1.5)
GFR: 88.24 mL/min (ref >60.00)
Glucose, Bld: 133 mg/dL — ABNORMAL HIGH (ref 70–99)
Phosphorus: 3.9 mg/dL (ref 2.3–4.6)
Potassium: 4 mEq/L (ref 3.5–5.1)
Sodium: 141 mEq/L (ref 135–145)

## 2012-07-08 LAB — CBC
HCT: 43.4 % (ref 39.0–52.0)
Hemoglobin: 14.2 g/dL (ref 13.0–17.0)
MCHC: 32.7 g/dL (ref 30.0–36.0)
MCV: 88.3 fl (ref 78.0–100.0)
Platelets: 203 10*3/uL (ref 150.0–400.0)
RBC: 4.91 Mil/uL (ref 4.22–5.81)
RDW: 17 % — ABNORMAL HIGH (ref 11.5–14.6)
WBC: 7.9 10*3/uL (ref 4.5–10.5)

## 2012-07-08 LAB — TSH: TSH: 1.09 u[IU]/mL (ref 0.35–5.50)

## 2012-07-09 LAB — VITAMIN B12: Vitamin B-12: 518 pg/mL (ref 211–911)

## 2012-07-13 LAB — INTRINSIC FACTOR ANTIBODIES: Intrinsic Factor: NEGATIVE

## 2012-07-15 ENCOUNTER — Ambulatory Visit: Payer: Managed Care, Other (non HMO) | Admitting: Physical Therapy

## 2012-07-15 ENCOUNTER — Encounter: Payer: Managed Care, Other (non HMO) | Admitting: Occupational Therapy

## 2012-07-16 ENCOUNTER — Ambulatory Visit (INDEPENDENT_AMBULATORY_CARE_PROVIDER_SITE_OTHER): Payer: Managed Care, Other (non HMO) | Admitting: Family Medicine

## 2012-07-16 ENCOUNTER — Encounter: Payer: Self-pay | Admitting: Family Medicine

## 2012-07-16 VITALS — BP 104/63 | HR 82 | Temp 97.4°F | Ht 75.0 in | Wt 296.8 lb

## 2012-07-16 DIAGNOSIS — E349 Endocrine disorder, unspecified: Secondary | ICD-10-CM

## 2012-07-16 DIAGNOSIS — E039 Hypothyroidism, unspecified: Secondary | ICD-10-CM

## 2012-07-16 DIAGNOSIS — I83893 Varicose veins of bilateral lower extremities with other complications: Secondary | ICD-10-CM

## 2012-07-16 DIAGNOSIS — I10 Essential (primary) hypertension: Secondary | ICD-10-CM

## 2012-07-16 DIAGNOSIS — E785 Hyperlipidemia, unspecified: Secondary | ICD-10-CM

## 2012-07-16 DIAGNOSIS — G35 Multiple sclerosis: Secondary | ICD-10-CM

## 2012-07-16 DIAGNOSIS — E291 Testicular hypofunction: Secondary | ICD-10-CM

## 2012-07-16 MED ORDER — METOPROLOL TARTRATE 25 MG PO TABS: 12.5000 mg | ORAL_TABLET | Freq: Two times a day (BID) | ORAL | Status: DC

## 2012-07-16 NOTE — Assessment & Plan Note (Signed)
He just finished a course of physical therapy and he would benefit from an ongoing relationship with Physiatry and intermittent therapy. We will refer today, continue stretching and stay as active as possible.

## 2012-07-16 NOTE — Assessment & Plan Note (Signed)
Check level again prior to next visit

## 2012-07-16 NOTE — Assessment & Plan Note (Addendum)
Well controlled but patient interested in trying to drop his Metoprolol due to the sexual side effects. He agrees to do this slowly and be monitored. We will drop his Metoprolol to 12.5 mg bid

## 2012-07-16 NOTE — Assessment & Plan Note (Signed)
stable °

## 2012-07-16 NOTE — Assessment & Plan Note (Signed)
Just had veins removed from right leg and is healing well.

## 2012-07-16 NOTE — Progress Notes (Signed)
Patient ID: Keith Morrow, male   DOB: [birth year], 41 y.o.   MRN: [id number] Keith Morrow [id number] [birth year] 07/16/2012      Progress Note-Follow Up  Subjective  Chief Complaint  Chief Complaint  Patient presents with  . Follow-up    had labs done on 07-08-12    HPI  Patient is a 41 year old Caucasian male who is in today for followup. He is MS continues to progress. He finished a short course of physical therapy but continues to struggle with unsteady gait and weakness. Is pleased with the adjustments to his brace and is been helping. He had one episode of very severe right calf muscle cramps and is concerned about that. Acknowledges that he cannot eat and drink nor take his baclofen. Since taking his baclofen and staying hydrated he stopped another episode. He's recently had his varicose veins of his right leg corrected and has decreased swelling and pain is a result is healing well. And improve circulation in his foot and is pleased with the result. Denies chest pain, palpitations, shortness of breath, fevers or recent acute illness otherwise.Marland Kitchen  Past Medical History  Diagnosis Date  . HTN (hypertension)   . Hypothyroidism   . Hyperlipidemia   . MS (multiple sclerosis)   . Coronary atherosclerosis of native coronary artery     a. NSTEMI 04/20/11: tx with Promus DES to LAD; bifurcation lesion with oDx 90% tx with POBA; b. staged PCI of right post. AV Branch with Promus DES; c. residual at cath 04/21/11:  AV groove CFX 50%, mRCA 30%,; EF 55%  . Chicken pox as a child  . Heart attack 04-20-11  . Allergy   . Obesity   . Depression   . Onychomycosis   . Tinea pedis   . Keloid scar   . Preventative health care 10/07/2011  . Reflux 10/07/2011  . 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 Hypotestosteronism 11/17/2011  . Dermatitis, contact 12/10/2011  . Bronchitis, acute 12/10/2011  . Testosterone deficiency 01/16/2012    Past Surgical History  Procedure Date  . Right wrist surgery      CTR  . Vasectomy   . Coronary angioplasty with stent placement     Family History  Problem Relation Age of Onset  . Coronary artery disease      questionable in father  . Heart attack Mother 18  . Thyroid disease Mother   . Thyroid disease Sister   . Thyroid disease Brother   . Heart disease Maternal Grandmother   . Hypertension Maternal Grandmother   . Hyperlipidemia Maternal Grandmother   . Heart disease Maternal Grandfather   . Hypertension Maternal Grandfather   . Hyperlipidemia Maternal Grandfather   . Stroke Paternal Grandfather   . Thyroid disease Sister     History   Social History  . Marital Status: Married    Spouse Name: N/A    Number of Children: 3  . Years of Education: N/A   Occupational History  . Not on file.   Social History Main Topics  . Smoking status: Never Smoker   . Smokeless tobacco: Never Used  . Alcohol Use: Yes     socially  . Drug Use: No  . Sexually Active: Yes -- Male partner(s)   Other Topics Concern  . Not on file   Social History Narrative  . No narrative on file    Current Outpatient Prescriptions on File Prior to Visit  Medication Sig Dispense Refill  . aspirin  81 MG tablet Take 81 mg by mouth daily.        Marland Kitchen atorvastatin (LIPITOR) 40 MG tablet Take 1 tablet (40 mg total) by mouth daily.  30 tablet  10  . baclofen (LIORESAL) 10 MG tablet Take 10 mg by mouth 4 (four) times daily.       . cetirizine (ZYRTEC) 10 MG tablet Take 10 mg by mouth as needed.        . Dalfampridine (AMPYRA) 10 MG TB12 Take by mouth. Every 12 hours       . DULoxetine (CYMBALTA) 60 MG capsule Take 1 capsule (60 mg total) by mouth 2 (two) times daily.  180 capsule  3  . LAMISIL 250 MG tablet Take 1 tablet (250 mg total) by mouth daily.  30 tablet  5  . LORazepam (ATIVAN) 1 MG tablet Take 1 tablet (1 mg total) by mouth every 6 (six) hours as needed.  90 tablet  2  . olmesartan (BENICAR) 20 MG tablet Take 1 tablet (20 mg total) by mouth daily.  30  tablet  4  . SYNTHROID 150 MCG tablet Take 1 tablet (150 mcg total) by mouth daily.  30 tablet  3  . testosterone cypionate (DEPOTESTOTERONE CYPIONATE) 200 MG/ML injection Inject 200 mg into the muscle every 14 (fourteen) days. Every week for 3 weeks than skip a week      . DISCONTD: metoprolol tartrate (LOPRESSOR) 25 MG tablet Take 1 tablet (25 mg total) by mouth 2 (two) times daily.  60 tablet  10    No Known Allergies  Review of Systems  Review of Systems  Constitutional: Negative for fever and malaise/fatigue.  HENT: Negative for congestion.   Eyes: Negative for discharge.  Respiratory: Negative for shortness of breath.   Cardiovascular: Negative for chest pain, palpitations and leg swelling.  Gastrointestinal: Negative for nausea, abdominal pain and diarrhea.  Genitourinary: Negative for dysuria.  Musculoskeletal: Negative for falls.  Skin: Negative for rash.  Neurological: Positive for tingling and focal weakness. Negative for loss of consciousness and headaches.  Endo/Heme/Allergies: Negative for polydipsia.  Psychiatric/Behavioral: Negative for depression and suicidal ideas. The patient is not nervous/anxious and does not have insomnia.     Objective  BP 104/63  Pulse 82  Temp 97.4 F (36.3 C) (Temporal)  Ht 6\' 3"  (1.905 m)  Wt 296 lb 12.8 oz (134.628 kg)  BMI 37.10 kg/m2  SpO2 97%  Physical Exam  Physical Exam  Constitutional: He is oriented to person, place, and time and well-developed, well-nourished, and in no distress. No distress.  HENT:  Head: Normocephalic and atraumatic.  Eyes: Conjunctivae are normal.  Neck: Neck supple. No thyromegaly present.  Cardiovascular: Normal rate, regular rhythm and normal heart sounds.   No murmur heard. Pulmonary/Chest: Effort normal and breath sounds normal. No respiratory distress.  Abdominal: He exhibits no distension and no mass. There is no tenderness.  Musculoskeletal: He exhibits no edema.       Brace right leg    Neurological: He is alert and oriented to person, place, and time.  Skin: Skin is warm.  Psychiatric: Memory, affect and judgment normal.    Lab Results  Component Value Date   TSH 1.09 07/08/2012   Lab Results  Component Value Date   WBC 7.9 07/08/2012   HGB 14.2 07/08/2012   HCT 43.4 07/08/2012   MCV 88.3 07/08/2012   PLT 203.0 07/08/2012   Lab Results  Component Value Date   CREATININE 1.0 07/08/2012  BUN 20 07/08/2012   NA 141 07/08/2012   K 4.0 07/08/2012   CL 103 07/08/2012   CO2 30 07/08/2012   Lab Results  Component Value Date   ALT 35 07/08/2012   AST 24 07/08/2012   ALKPHOS 57 07/08/2012   BILITOT 0.5 07/08/2012   Lab Results  Component Value Date   CHOL 146 11/03/2011   Lab Results  Component Value Date   HDL 55.40 11/03/2011   Lab Results  Component Value Date   LDLCALC 80 11/03/2011   Lab Results  Component Value Date   TRIG 53.0 11/03/2011   Lab Results  Component Value Date   CHOLHDL 3 11/03/2011     Assessment & Plan  HTN (hypertension) Well controlled but patient interested in trying to drop his Metoprolol due to the sexual side effects. He agrees to do this slowly and be monitored. We will drop his Metoprolol to 12.5 mg bid  MS (multiple sclerosis) He just finished a course of physical therapy and he would benefit from an ongoing relationship with Physiatry and intermittent therapy. We will refer today, continue stretching and stay as active as possible.  Varicose veins of lower extremities with other complications Just had veins removed from right leg and is healing well.  Hypotestosteronism Check level again prior to next visit  Hypothyroidism stable  Hyperlipidemia Check levels prior to next visit.

## 2012-07-16 NOTE — Patient Instructions (Addendum)
Muscle Cramps  Muscle cramps are due to sudden involuntary muscle contraction. This means you have no control over the tightening of a muscle (or muscles). Often there are no obvious causes. Muscle cramps may occur with overexertion. They may also occur with chilling of the muscles. An example of a muscle chilling activity is swimming. It is uncommon for cramps to be due to a serious underlying disorder. In most cases, muscle cramps improve (or leave) within minutes.  CAUSES   Some common causes are:   Injury.   Infections, especially viral.   Abnormal levels of the salts and ions in your blood (electrolytes). This could happen if you are taking water pills (diuretics).   Blood vessel disease where not enough blood is getting to the muscles (intermittent claudication).  Some uncommon causes are:   Side effects of some medicine (such as lithium).   Alcohol abuse.   Diseases where there is soreness (inflammation) of the muscular system.  HOME CARE INSTRUCTIONS    It may be helpful to massage, stretch, and relax the affected muscle.   Taking a dose of over-the-counter diphenhydramine is helpful for night leg cramps.  SEEK MEDICAL CARE IF:   Cramps are frequent and not relieved with medicine.  MAKE SURE YOU:    Understand these instructions.   Will watch your condition.   Will get help right away if you are not doing well or get worse.  Document Released: 05/16/2002 Document Revised: 11/13/2011 Document Reviewed: 11/15/2008  [employer] Patient Information 2012 [employer].

## 2012-07-18 NOTE — Assessment & Plan Note (Signed)
Check levels prior to next visit.

## 2012-08-05 ENCOUNTER — Encounter: Payer: Self-pay | Admitting: Physical Medicine and Rehabilitation

## 2012-08-13 ENCOUNTER — Ambulatory Visit (HOSPITAL_COMMUNITY)
Admission: RE | Admit: 2012-08-13 | Discharge: 2012-08-13 | Disposition: A | Discharge status: Home / Self Care | Payer: Managed Care, Other (non HMO) | Source: Ambulatory Visit | Attending: Physical Medicine and Rehabilitation | Admitting: Physical Medicine and Rehabilitation

## 2012-08-13 ENCOUNTER — Encounter: Payer: Self-pay | Admitting: Physical Medicine and Rehabilitation

## 2012-08-13 ENCOUNTER — Encounter
Payer: Managed Care, Other (non HMO) | Attending: Physical Medicine and Rehabilitation | Admitting: Physical Medicine and Rehabilitation

## 2012-08-13 VITALS — BP 139/74 | HR 80 | Resp 14 | Ht 73.0 in | Wt 305.0 lb

## 2012-08-13 DIAGNOSIS — R29898 Other symptoms and signs involving the musculoskeletal system: Secondary | ICD-10-CM

## 2012-08-13 DIAGNOSIS — M549 Dorsalgia, unspecified: Secondary | ICD-10-CM | POA: Insufficient documentation

## 2012-08-13 DIAGNOSIS — R296 Repeated falls: Secondary | ICD-10-CM

## 2012-08-13 DIAGNOSIS — G35 Multiple sclerosis: Secondary | ICD-10-CM | POA: Insufficient documentation

## 2012-08-13 DIAGNOSIS — K5900 Constipation, unspecified: Secondary | ICD-10-CM | POA: Insufficient documentation

## 2012-08-13 DIAGNOSIS — M4180 Other forms of scoliosis, site unspecified: Secondary | ICD-10-CM | POA: Insufficient documentation

## 2012-08-13 DIAGNOSIS — R279 Unspecified lack of coordination: Secondary | ICD-10-CM | POA: Insufficient documentation

## 2012-08-13 DIAGNOSIS — R209 Unspecified disturbances of skin sensation: Secondary | ICD-10-CM | POA: Insufficient documentation

## 2012-08-13 DIAGNOSIS — Z9181 History of falling: Secondary | ICD-10-CM | POA: Insufficient documentation

## 2012-08-13 DIAGNOSIS — M62838 Other muscle spasm: Secondary | ICD-10-CM | POA: Insufficient documentation

## 2012-08-13 NOTE — Patient Instructions (Signed)
I have set up physical therapy to focus on hip strength. I would also like them to assess your trunk strength. I have also ordered thoracic spine x-rays.  You have mentioned you have some old films. Please bring the exam to your next visit.  It may be of value to the radiologist if you could bring in your old thoracic  films for him to review along with your new x-rays.
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
## 2012-08-14 ENCOUNTER — Encounter: Payer: Self-pay | Admitting: Family Medicine

## 2012-08-14 DIAGNOSIS — I10 Essential (primary) hypertension: Secondary | ICD-10-CM

## 2012-08-16 ENCOUNTER — Other Ambulatory Visit: Payer: Self-pay | Admitting: Physical Medicine and Rehabilitation

## 2012-08-16 DIAGNOSIS — M546 Pain in thoracic spine: Secondary | ICD-10-CM

## 2012-08-16 DIAGNOSIS — Z9181 History of falling: Secondary | ICD-10-CM

## 2012-08-16 DIAGNOSIS — R2689 Other abnormalities of gait and mobility: Secondary | ICD-10-CM

## 2012-08-16 NOTE — Telephone Encounter (Signed)
Please advise the mychart question

## 2012-08-19 MED ORDER — TRIAMCINOLONE ACETONIDE 0.1 % EX CREA: 1.0000 "application " | TOPICAL_CREAM | Freq: Two times a day (BID) | CUTANEOUS | Status: DC

## 2012-08-23 ENCOUNTER — Other Ambulatory Visit: Payer: Self-pay

## 2012-08-23 MED ORDER — LAMISIL 250 MG PO TABS: 250.0000 mg | ORAL_TABLET | Freq: Every day | ORAL | Status: DC

## 2012-08-23 MED ORDER — SYNTHROID 150 MCG PO TABS: 150.0000 ug | ORAL_TABLET | Freq: Every day | ORAL | Status: DC

## 2012-08-23 MED ORDER — OLMESARTAN MEDOXOMIL 20 MG PO TABS: 20.0000 mg | ORAL_TABLET | Freq: Every day | ORAL | Status: DC

## 2012-08-23 NOTE — Telephone Encounter (Signed)
Patient med was faxed to pharmacy from pharmacy refill request paperwork

## 2012-08-24 ENCOUNTER — Inpatient Hospital Stay (HOSPITAL_COMMUNITY): Admission: RE | Admit: 2012-08-24 | Payer: Managed Care, Other (non HMO) | Source: Ambulatory Visit

## 2012-09-10 ENCOUNTER — Ambulatory Visit (HOSPITAL_COMMUNITY)
Admission: RE | Admit: 2012-09-10 | Discharge: 2012-09-10 | Disposition: A | Discharge status: Home / Self Care | Payer: Managed Care, Other (non HMO) | Source: Ambulatory Visit | Attending: Physical Medicine and Rehabilitation | Admitting: Physical Medicine and Rehabilitation

## 2012-09-10 DIAGNOSIS — R29898 Other symptoms and signs involving the musculoskeletal system: Secondary | ICD-10-CM | POA: Insufficient documentation

## 2012-09-10 DIAGNOSIS — R2689 Other abnormalities of gait and mobility: Secondary | ICD-10-CM

## 2012-09-10 DIAGNOSIS — Z9181 History of falling: Secondary | ICD-10-CM

## 2012-09-10 DIAGNOSIS — R209 Unspecified disturbances of skin sensation: Secondary | ICD-10-CM | POA: Insufficient documentation

## 2012-09-10 DIAGNOSIS — M5380 Other specified dorsopathies, site unspecified: Secondary | ICD-10-CM | POA: Insufficient documentation

## 2012-09-10 DIAGNOSIS — M546 Pain in thoracic spine: Secondary | ICD-10-CM

## 2012-09-10 DIAGNOSIS — G35 Multiple sclerosis: Secondary | ICD-10-CM | POA: Insufficient documentation

## 2012-09-13 ENCOUNTER — Other Ambulatory Visit: Payer: Self-pay

## 2012-09-13 ENCOUNTER — Encounter: Payer: Self-pay | Admitting: Physical Medicine and Rehabilitation

## 2012-09-13 ENCOUNTER — Encounter
Payer: Managed Care, Other (non HMO) | Attending: Physical Medicine and Rehabilitation | Admitting: Physical Medicine and Rehabilitation

## 2012-09-13 VITALS — BP 121/60 | HR 70 | Resp 14 | Wt 302.6 lb

## 2012-09-13 DIAGNOSIS — R252 Cramp and spasm: Secondary | ICD-10-CM

## 2012-09-13 DIAGNOSIS — R259 Unspecified abnormal involuntary movements: Secondary | ICD-10-CM

## 2012-09-13 DIAGNOSIS — M549 Dorsalgia, unspecified: Secondary | ICD-10-CM

## 2012-09-13 DIAGNOSIS — G8929 Other chronic pain: Secondary | ICD-10-CM | POA: Insufficient documentation

## 2012-09-13 DIAGNOSIS — R29898 Other symptoms and signs involving the musculoskeletal system: Secondary | ICD-10-CM

## 2012-09-13 MED ORDER — SYNTHROID 150 MCG PO TABS: 150.0000 ug | ORAL_TABLET | Freq: Every day | ORAL | Status: DC
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
## 2012-09-13 NOTE — Patient Instructions (Addendum)
Today we reviewed your thoracic x-rays and cervical MRI.  I have ordered a thoracic MRI.  I have written orders for physical therapy to address right lower extremity weakness. Specifically would like PT to work on functional tasks such as stairclimbing and transferring in and out of car.  I will see you back in 6 weeks.

## 2012-09-17 ENCOUNTER — Ambulatory Visit: Payer: Managed Care, Other (non HMO) | Admitting: Physical Medicine and Rehabilitation

## 2012-09-25 ENCOUNTER — Ambulatory Visit (HOSPITAL_COMMUNITY): Payer: Managed Care, Other (non HMO)

## 2012-10-11 ENCOUNTER — Other Ambulatory Visit (INDEPENDENT_AMBULATORY_CARE_PROVIDER_SITE_OTHER): Payer: Managed Care, Other (non HMO)

## 2012-10-11 DIAGNOSIS — E785 Hyperlipidemia, unspecified: Secondary | ICD-10-CM

## 2012-10-11 DIAGNOSIS — E039 Hypothyroidism, unspecified: Secondary | ICD-10-CM

## 2012-10-11 DIAGNOSIS — I10 Essential (primary) hypertension: Secondary | ICD-10-CM

## 2012-10-11 DIAGNOSIS — E291 Testicular hypofunction: Secondary | ICD-10-CM

## 2012-10-11 LAB — HEPATIC FUNCTION PANEL
ALT: 40 U/L (ref 0–53)
AST: 25 U/L (ref 0–37)
Albumin: 4.2 g/dL (ref 3.5–5.2)
Alkaline Phosphatase: 53 U/L (ref 39–117)
Bilirubin, Direct: 0.1 mg/dL (ref 0.0–0.3)
Total Bilirubin: 0.7 mg/dL (ref 0.3–1.2)
Total Protein: 7 g/dL (ref 6.0–8.3)

## 2012-10-11 LAB — RENAL FUNCTION PANEL
Albumin: 4.2 g/dL (ref 3.5–5.2)
BUN: 14 mg/dL (ref 6–23)
CO2: 31 mEq/L (ref 19–32)
Calcium: 9 mg/dL (ref 8.4–10.5)
Chloride: 104 mEq/L (ref 96–112)
Creatinine, Ser: 1.1 mg/dL (ref 0.4–1.5)
GFR: 81.51 mL/min (ref >60.00)
Glucose, Bld: 92 mg/dL (ref 70–99)
Phosphorus: 3 mg/dL (ref 2.3–4.6)
Potassium: 4.5 mEq/L (ref 3.5–5.1)
Sodium: 140 mEq/L (ref 135–145)

## 2012-10-11 LAB — LIPID PANEL
Cholesterol: 149 mg/dL (ref 0–200)
HDL: 49.9 mg/dL (ref >39.00)
LDL Cholesterol: 88 mg/dL (ref 0–99)
Total CHOL/HDL Ratio: 3
Triglycerides: 57 mg/dL (ref 0.0–149.0)
VLDL: 11.4 mg/dL (ref 0.0–40.0)

## 2012-10-11 LAB — CBC
HCT: 46.4 % (ref 39.0–52.0)
Hemoglobin: 15.2 g/dL (ref 13.0–17.0)
MCHC: 32.7 g/dL (ref 30.0–36.0)
MCV: 90.4 fl (ref 78.0–100.0)
Platelets: 206 10*3/uL (ref 150.0–400.0)
RBC: 5.14 Mil/uL (ref 4.22–5.81)
RDW: 15.3 % — ABNORMAL HIGH (ref 11.5–14.6)
WBC: 8.9 10*3/uL (ref 4.5–10.5)

## 2012-10-11 LAB — MAGNESIUM: Magnesium: 2.1 mg/dL (ref 1.5–2.5)

## 2012-10-11 LAB — TESTOSTERONE: Testosterone: 441.78 ng/dL (ref 350.00–890.00)

## 2012-10-11 LAB — TSH: TSH: 0.56 u[IU]/mL (ref 0.35–5.50)

## 2012-10-15 ENCOUNTER — Ambulatory Visit (INDEPENDENT_AMBULATORY_CARE_PROVIDER_SITE_OTHER): Payer: Managed Care, Other (non HMO) | Admitting: Family Medicine

## 2012-10-15 ENCOUNTER — Encounter: Payer: Self-pay | Admitting: Family Medicine

## 2012-10-15 VITALS — BP 117/81 | HR 82 | Temp 98.3°F | Ht 75.0 in | Wt 298.1 lb

## 2012-10-15 DIAGNOSIS — G35 Multiple sclerosis: Secondary | ICD-10-CM

## 2012-10-15 DIAGNOSIS — Z23 Encounter for immunization: Secondary | ICD-10-CM

## 2012-10-15 DIAGNOSIS — I10 Essential (primary) hypertension: Secondary | ICD-10-CM

## 2012-10-15 DIAGNOSIS — Z0000 Encounter for general adult medical examination without abnormal findings: Secondary | ICD-10-CM

## 2012-10-15 DIAGNOSIS — E291 Testicular hypofunction: Secondary | ICD-10-CM

## 2012-10-15 DIAGNOSIS — E785 Hyperlipidemia, unspecified: Secondary | ICD-10-CM

## 2012-10-15 DIAGNOSIS — R5383 Other fatigue: Secondary | ICD-10-CM

## 2012-10-15 DIAGNOSIS — R5381 Other malaise: Secondary | ICD-10-CM

## 2012-10-15 DIAGNOSIS — R7989 Other specified abnormal findings of blood chemistry: Secondary | ICD-10-CM

## 2012-10-15 DIAGNOSIS — F329 Major depressive disorder, single episode, unspecified: Secondary | ICD-10-CM

## 2012-10-15 DIAGNOSIS — N529 Male erectile dysfunction, unspecified: Secondary | ICD-10-CM

## 2012-10-15 HISTORY — DX: Other fatigue: R53.83

## 2012-10-15 MED ORDER — METHYLPHENIDATE HCL 10 MG PO TABS: 10.0000 mg | ORAL_TABLET | Freq: Two times a day (BID) | ORAL | Status: DC

## 2012-10-15 MED ORDER — TESTOSTERONE CYPIONATE 200 MG/ML IM SOLN: 200.0000 mg | INTRAMUSCULAR | Status: DC

## 2012-10-15 NOTE — Assessment & Plan Note (Signed)
Adequately controlled, no change in meds 

## 2012-10-15 NOTE — Patient Instructions (Addendum)
Preventive Care for Adults, Male A healthy lifestyle and preventive care can promote health and wellness. Preventive health guidelines for men include the following key practices:  A routine yearly physical is a good way to check with your caregiver about your health and preventative screening. It is a chance to share any concerns and updates on your health, and to receive a thorough exam.  Visit your dentist for a routine exam and preventative care every 6 months. Brush your teeth twice a day and floss once a day. Good oral hygiene prevents tooth decay and gum disease.  The frequency of eye exams is based on your age, health, family medical history, use of contact lenses, and other factors. Follow your caregiver's recommendations for frequency of eye exams.  Eat a healthy diet. Foods like vegetables, fruits, whole grains, low-fat dairy products, and lean protein foods contain the nutrients you need without too many calories. Decrease your intake of foods high in solid fats, added sugars, and salt. Eat the right amount of calories for you.Get information about a proper diet from your caregiver, if necessary.  Regular physical exercise is one of the most important things you can do for your health. Most adults should get at least 150 minutes of moderate-intensity exercise (any activity that increases your heart rate and causes you to sweat) each week. In addition, most adults need muscle-strengthening exercises on 2 or more days a week.  Maintain a healthy weight. The body mass index (BMI) is a screening tool to identify possible weight problems. It provides an estimate of body fat based on height and weight. Your caregiver can help determine your BMI, and can help you achieve or maintain a healthy weight.For adults 20 years and older:  A BMI below 18.5 is considered underweight.  A BMI of 18.5 to 24.9 is normal.  A BMI of 25 to 29.9 is considered overweight.  A BMI of 30 and above is  considered obese.  Maintain normal blood lipids and cholesterol levels by exercising and minimizing your intake of saturated fat. Eat a balanced diet with plenty of fruit and vegetables. Blood tests for lipids and cholesterol should begin at age 20 and be repeated every 5 years. If your lipid or cholesterol levels are high, you are over 50, or you are a high risk for heart disease, you may need your cholesterol levels checked more frequently.Ongoing high lipid and cholesterol levels should be treated with medicines if diet and exercise are not effective.  If you smoke, find out from your caregiver how to quit. If you do not use tobacco, do not start.  If you choose to drink alcohol, do not exceed 2 drinks per day. One drink is considered to be 12 ounces (355 mL) of beer, 5 ounces (148 mL) of wine, or 1.5 ounces (44 mL) of liquor.  Avoid use of street drugs. Do not share needles with anyone. Ask for help if you need support or instructions about stopping the use of drugs.  High blood pressure causes heart disease and increases the risk of stroke. Your blood pressure should be checked at least every 1 to 2 years. Ongoing high blood pressure should be treated with medicines, if weight loss and exercise are not effective.  If you are 45 to 39 years old, ask your caregiver if you should take aspirin to prevent heart disease.  Diabetes screening involves taking a blood sample to check your fasting blood sugar level. This should be done once every 3 years,   after age 45, if you are within normal weight and without risk factors for diabetes. Testing should be considered at a younger age or be carried out more frequently if you are overweight and have at least 1 risk factor for diabetes.  Colorectal cancer can be detected and often prevented. Most routine colorectal cancer screening begins at the age of 50 and continues through age 75. However, your caregiver may recommend screening at an earlier age if you  have risk factors for colon cancer. On a yearly basis, your caregiver may provide home test kits to check for hidden blood in the stool. Use of a small camera at the end of a tube, to directly examine the colon (sigmoidoscopy or colonoscopy), can detect the earliest forms of colorectal cancer. Talk to your caregiver about this at age 50, when routine screening begins. Direct examination of the colon should be repeated every 5 to 10 years through age 75, unless early forms of pre-cancerous polyps or small growths are found.  Hepatitis C blood testing is recommended for all people born from 1945 through 1965 and any individual with known risks for hepatitis C.  Practice safe sex. Use condoms and avoid high-risk sexual practices to reduce the spread of sexually transmitted infections (STIs). STIs include gonorrhea, chlamydia, syphilis, trichomonas, herpes, HPV, and human immunodeficiency virus (HIV). Herpes, HIV, and HPV are viral illnesses that have no cure. They can result in disability, cancer, and death.  A one-time screening for abdominal aortic aneurysm (AAA) and surgical repair of large AAAs by sound wave imaging (ultrasonography) is recommended for ages 65 to 75 years who are current or former smokers.  Healthy men should no longer receive prostate-specific antigen (PSA) blood tests as part of routine cancer screening. Consult with your caregiver about prostate cancer screening.  Testicular cancer screening is not recommended for adult males who have no symptoms. Screening includes self-exam, caregiver exam, and other screening tests. Consult with your caregiver about any symptoms you have or any concerns you have about testicular cancer.  Use sunscreen with skin protection factor (SPF) of 30 or more. Apply sunscreen liberally and repeatedly throughout the day. You should seek shade when your shadow is shorter than you. Protect yourself by wearing long sleeves, pants, a wide-brimmed hat, and  sunglasses year round, whenever you are outdoors.  Once a month, do a whole body skin exam, using a mirror to look at the skin on your back. Notify your caregiver of new moles, moles that have irregular borders, moles that are larger than a pencil eraser, or moles that have changed in shape or color.  Stay current with required immunizations.  Influenza. You need a dose every fall (or winter). The composition of the flu vaccine changes each year, so being vaccinated once is not enough.  Pneumococcal polysaccharide. You need 1 to 2 doses if you smoke cigarettes or if you have certain chronic medical conditions. You need 1 dose at age 65 (or older) if you have never been vaccinated.  Tetanus, diphtheria, pertussis (Tdap, Td). Get 1 dose of Tdap vaccine if you are younger than age 65 years, are over 65 and have contact with an infant, are a healthcare worker, or simply want to be protected from whooping cough. After that, you need a Td booster dose every 10 years. Consult your caregiver if you have not had at least 3 tetanus and diphtheria-containing shots sometime in your life or have a deep or dirty wound.  HPV. This vaccine is recommended   for males 13 through 39 years of age. This vaccine may be given to men 22 through 39 years of age who have not completed the 3 dose series. It is recommended for men through age 26 who have sex with men or whose immune system is weakened because of HIV infection, other illness, or medications. The vaccine is given in 3 doses over 6 months.  Measles, mumps, rubella (MMR). You need at least 1 dose of MMR if you were born in 1957 or later. You may also need a 2nd dose.  Meningococcal. If you are age 19 to 21 years and a first-year college student living in a residence hall, or have one of several medical conditions, you need to get vaccinated against meningococcal disease. You may also need additional booster doses.  Zoster (shingles). If you are age 60 years or  older, you should get this vaccine.  Varicella (chickenpox). If you have never had chickenpox or you were vaccinated but received only 1 dose, talk to your caregiver to find out if you need this vaccine.  Hepatitis A. You need this vaccine if you have a specific risk factor for hepatitis A virus infection, or you simply wish to be protected from this disease. The vaccine is usually given as 2 doses, 6 to 18 months apart.  Hepatitis B. You need this vaccine if you have a specific risk factor for hepatitis B virus infection or you simply wish to be protected from this disease. The vaccine is given in 3 doses, usually over 6 months. Preventative Service / Frequency Ages 19 to 39  Blood pressure check.** / Every 1 to 2 years.  Lipid and cholesterol check.** / Every 5 years beginning at age 20.  Hepatitis C blood test.** / For any individual with known risks for hepatitis C.  Skin self-exam. / Monthly.  Influenza immunization.** / Every year.  Pneumococcal polysaccharide immunization.** / 1 to 2 doses if you smoke cigarettes or if you have certain chronic medical conditions.  Tetanus, diphtheria, pertussis (Tdap,Td) immunization. / A one-time dose of Tdap vaccine. After that, you need a Td booster dose every 10 years.  HPV immunization. / 3 doses over 6 months, if 26 and younger.  Measles, mumps, rubella (MMR) immunization. / You need at least 1 dose of MMR if you were born in 1957 or later. You may also need a 2nd dose.  Meningococcal immunization. / 1 dose if you are age 19 to 21 years and a first-year college student living in a residence hall, or have one of several medical conditions, you need to get vaccinated against meningococcal disease. You may also need additional booster doses.  Varicella immunization.** / Consult your caregiver.  Hepatitis A immunization.** / Consult your caregiver. 2 doses, 6 to 18 months apart.  Hepatitis B immunization.** / Consult your caregiver. 3 doses  usually over 6 months. Ages 40 to 64  Blood pressure check.** / Every 1 to 2 years.  Lipid and cholesterol check.** / Every 5 years beginning at age 20.  Fecal occult blood test (FOBT) of stool. / Every year beginning at age 50 and continuing until age 75. You may not have to do this test if you get colonoscopy every 10 years.  Flexible sigmoidoscopy** or colonoscopy.** / Every 5 years for a flexible sigmoidoscopy or every 10 years for a colonoscopy beginning at age 50 and continuing until age 75.  Hepatitis C blood test.** / For all people born from 1945 through 1965 and any   individual with known risks for hepatitis C.  Skin self-exam. / Monthly.  Influenza immunization.** / Every year.  Pneumococcal polysaccharide immunization.** / 1 to 2 doses if you smoke cigarettes or if you have certain chronic medical conditions.  Tetanus, diphtheria, pertussis (Tdap/Td) immunization.** / A one-time dose of Tdap vaccine. After that, you need a Td booster dose every 10 years.  Measles, mumps, rubella (MMR) immunization. / You need at least 1 dose of MMR if you were born in 1957 or later. You may also need a 2nd dose.  Varicella immunization.**/ Consult your caregiver.  Meningococcal immunization.** / Consult your caregiver.  Hepatitis A immunization.** / Consult your caregiver. 2 doses, 6 to 18 months apart.  Hepatitis B immunization.** / Consult your caregiver. 3 doses, usually over 6 months. Ages 65 and over  Blood pressure check.** / Every 1 to 2 years.  Lipid and cholesterol check.**/ Every 5 years beginning at age 20.  Fecal occult blood test (FOBT) of stool. / Every year beginning at age 50 and continuing until age 75. You may not have to do this test if you get colonoscopy every 10 years.  Flexible sigmoidoscopy** or colonoscopy.** / Every 5 years for a flexible sigmoidoscopy or every 10 years for a colonoscopy beginning at age 50 and continuing until age 75.  Hepatitis C blood  test.** / For all people born from 1945 through 1965 and any individual with known risks for hepatitis C.  Abdominal aortic aneurysm (AAA) screening.** / A one-time screening for ages 65 to 75 years who are current or former smokers.  Skin self-exam. / Monthly.  Influenza immunization.** / Every year.  Pneumococcal polysaccharide immunization.** / 1 dose at age 65 (or older) if you have never been vaccinated.  Tetanus, diphtheria, pertussis (Tdap, Td) immunization. / A one-time dose of Tdap vaccine if you are over 65 and have contact with an infant, are a healthcare worker, or simply want to be protected from whooping cough. After that, you need a Td booster dose every 10 years.  Varicella immunization. ** / Consult your caregiver.  Meningococcal immunization.** / Consult your caregiver.  Hepatitis A immunization. ** / Consult your caregiver. 2 doses, 6 to 18 months apart.  Hepatitis B immunization.** / Check with your caregiver. 3 doses, usually over 6 months. **Family history and personal history of risk and conditions may change your caregiver's recommendations. Document Released: 01/20/2002 Document Revised: 02/16/2012 Document Reviewed: 04/21/2011 [employer] Patient Information 2013 [employer].  

## 2012-10-18 NOTE — Assessment & Plan Note (Signed)
Adequately controlled, no change in meds. Avoid trans fats.

## 2012-10-18 NOTE — Assessment & Plan Note (Signed)
Noting recent episodes of double vision, has some exercises given to him by OT which he agrees to restart.

## 2012-10-18 NOTE — Progress Notes (Signed)
Patient ID: Keith Morrow, male   DOB: Jun 10, 1971, 41 y.o.   MRN: [id number] Keith Morrow [id number] 1971-03-28 10/18/2012      Progress Note New Patient  Subjective  Chief Complaint  Chief Complaint  Patient presents with  . Annual Exam    physical  . Injections    flu    HPI  Patient is a 41 year old Caucasian male who is in today for annual he continues to struggle with depressive illness but overall is doing well. He is working. His biggest complaint is fatigue. He travels extensively for his job when he travels he often ends up in she 40 hours. Fatigue is overwhelming at times. He is fatigue on his own schedule as well but it is worse when traveling days. No other acute complaints. No recent illness, fevers, chills, chest pain palpitations, shortness of breath, GI or GU complaints. He does note a mild increase in some double vision which he struggled with in the past. He has some exercises he's been prescribed by occupational therapy but has not started them yet.  Past Medical History  Diagnosis Date  . HTN (hypertension)   . Hypothyroidism   . Hyperlipidemia   . MS (multiple sclerosis)   . Coronary atherosclerosis of native coronary artery     a. NSTEMI 04/20/11: tx with Promus DES to LAD; bifurcation lesion with oDx 90% tx with POBA; b. staged PCI of right post. AV Branch with Promus DES; c. residual at cath 04/21/11:  AV groove CFX 50%, mRCA 30%,; EF 55%  . Chicken pox as a child  . Heart attack 04-20-11  . Allergy   . Obesity   . Depression   . Onychomycosis   . Tinea pedis   . Keloid scar   . Preventative health care 10/07/2011  . Reflux 10/07/2011  . 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 Hypotestosteronism 11/17/2011  . Dermatitis, contact 12/10/2011  . Bronchitis, acute 12/10/2011  . Testosterone deficiency 01/16/2012  . Fatigue 10/15/2012    Past Surgical History  Procedure Date  . Right wrist surgery     CTR  . Vasectomy   . Coronary angioplasty with  stent placement   . Vascular surgery     vericose vein in right femoral area  . Wisdom tooth extraction     Family History  Problem Relation Age of Onset  . Coronary artery disease      questionable in father  . Heart attack Mother 93  . Thyroid disease Mother   . Heart disease Mother   . Thyroid disease Sister   . Thyroid disease Brother   . Heart disease Maternal Grandmother   . Hypertension Maternal Grandmother   . Hyperlipidemia Maternal Grandmother   . Heart disease Maternal Grandfather   . Hypertension Maternal Grandfather   . Hyperlipidemia Maternal Grandfather   . Stroke Paternal Grandfather   . Thyroid disease Sister   . Heart disease Father     History   Social History  . Marital Status: Married    Spouse Name: N/A    Number of Children: 3  . Years of Education: N/A   Occupational History  . Not on file.   Social History Main Topics  . Smoking status: Never Smoker   . Smokeless tobacco: Never Used  . Alcohol Use: Yes     Comment: socially  . Drug Use: No  . Sexually Active: Yes -- Male partner(s)   Other Topics Concern  . Not on  file   Social History Narrative  . No narrative on file    Current Outpatient Prescriptions on File Prior to Visit  Medication Sig Dispense Refill  . aspirin 81 MG tablet Take 81 mg by mouth daily.        Marland Kitchen atorvastatin (LIPITOR) 40 MG tablet Take 1 tablet (40 mg total) by mouth daily.  30 tablet  10  . baclofen (LIORESAL) 10 MG tablet Take 20 mg by mouth 3 (three) times daily.       . cetirizine (ZYRTEC) 10 MG tablet Take 10 mg by mouth as needed.        . Dalfampridine (AMPYRA) 10 MG TB12 Take by mouth. Every 12 hours       . DULoxetine (CYMBALTA) 60 MG capsule Take 1 capsule (60 mg total) by mouth 2 (two) times daily.  180 capsule  3  . LAMISIL 250 MG tablet Take 1 tablet (250 mg total) by mouth daily.  30 tablet  1  . LORazepam (ATIVAN) 1 MG tablet Take 1 tablet (1 mg total) by mouth every 6 (six) hours as needed.   90 tablet  2  . metoprolol tartrate (LOPRESSOR) 25 MG tablet Take 25 mg by mouth 2 (two) times daily.      Marland Kitchen olmesartan (BENICAR) 20 MG tablet Take 1 tablet (20 mg total) by mouth daily.  30 tablet  4  . SYNTHROID 150 MCG tablet Take 1 tablet (150 mcg total) by mouth daily.  30 tablet  4  . testosterone cypionate (DEPOTESTOTERONE CYPIONATE) 200 MG/ML injection Inject 1 mL (200 mg total) into the muscle every 14 (fourteen) days. Every week for 3 weeks than skip a week  10 mL  2  . methylphenidate (RITALIN) 10 MG tablet Take 1 tablet (10 mg total) by mouth 2 (two) times daily.  60 tablet  0  . sildenafil (VIAGRA) 50 MG tablet Take 1 tablet (50 mg total) by mouth daily as needed for erectile dysfunction.  10 tablet  0    No Known Allergies  Review of Systems  Review of Systems  Constitutional: Positive for malaise/fatigue. Negative for fever.  HENT: Negative for congestion.   Eyes: Positive for double vision. Negative for discharge.  Respiratory: Negative for shortness of breath.   Cardiovascular: Negative for chest pain, palpitations and leg swelling.  Gastrointestinal: Negative for nausea, abdominal pain and diarrhea.  Genitourinary: Negative for dysuria.  Musculoskeletal: Negative for falls.  Skin: Negative for rash.  Neurological: Negative for loss of consciousness and headaches.  Endo/Heme/Allergies: Negative for polydipsia.  Psychiatric/Behavioral: Negative for depression and suicidal ideas. The patient is not nervous/anxious and does not have insomnia.     Objective  BP 117/81  Pulse 82  Temp 98.3 F (36.8 C) (Temporal)  Ht 6\' 3"  (1.905 m)  Wt 298 lb 1.9 oz (135.226 kg)  BMI 37.26 kg/m2  SpO2 99%  Physical Exam  Physical Exam  Constitutional: He is oriented to person, place, and time and well-developed, well-nourished, and in no distress. No distress.  HENT:  Head: Normocephalic and atraumatic.  Eyes: Conjunctivae normal are normal.  Neck: Neck supple. No thyromegaly  present.  Cardiovascular: Normal rate, regular rhythm and normal heart sounds.   No murmur heard. Pulmonary/Chest: Effort normal and breath sounds normal. No respiratory distress.  Abdominal: He exhibits no distension and no mass. There is no tenderness.  Musculoskeletal: He exhibits no edema.  Neurological: He is alert and oriented to person, place, and time.  Skin: Skin  is warm.  Psychiatric: Memory, affect and judgment normal.       Assessment & Plan  HTN (hypertension) Adequately controlled, no change in meds  Depression Stable on current meds  MS (multiple sclerosis) Noting recent episodes of double vision, has some exercises given to him by OT which he agrees to restart.   Hyperlipidemia Adequately controlled, no change in meds. Avoid trans fats.   Preventative health care Given flu shot today, encouraged heart healthy diet and exercise as tolerated.  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 We will try Ritalin 10 mg bid to see if this is helpful

## 2012-10-18 NOTE — Assessment & Plan Note (Signed)
Stable on current meds 

## 2012-10-18 NOTE — Assessment & Plan Note (Signed)
Given flu shot today, encouraged heart healthy diet and exercise as tolerated.

## 2012-10-18 NOTE — Assessment & Plan Note (Signed)
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 We will try Ritalin 10 mg bid to see if this is helpful

## 2012-10-19 ENCOUNTER — Other Ambulatory Visit: Payer: Self-pay | Admitting: *Deleted

## 2012-10-19 NOTE — Telephone Encounter (Signed)
No we should not refill this med, he did not mention it in his visit and he should not stay on it long term

## 2012-10-19 NOTE — Telephone Encounter (Signed)
Faxed refill request received from pharmacy for TERBINAFINE Last filled by MD on 08/19/12, #30 X 1 Last seen on 10/15/12  Pt has been taking this medication continuously since 01/26/12.  Is this refill OK?  Please advise.

## 2012-10-22 ENCOUNTER — Other Ambulatory Visit: Payer: Self-pay

## 2012-10-22 MED ORDER — LAMISIL 250 MG PO TABS: 250.0000 mg | ORAL_TABLET | Freq: Every day | ORAL | Status: DC

## 2012-10-27 ENCOUNTER — Encounter
Payer: Managed Care, Other (non HMO) | Attending: Physical Medicine and Rehabilitation | Admitting: Physical Medicine and Rehabilitation

## 2012-10-27 DIAGNOSIS — M549 Dorsalgia, unspecified: Secondary | ICD-10-CM | POA: Insufficient documentation

## 2012-10-27 DIAGNOSIS — R29898 Other symptoms and signs involving the musculoskeletal system: Secondary | ICD-10-CM | POA: Insufficient documentation

## 2012-10-27 DIAGNOSIS — R259 Unspecified abnormal involuntary movements: Secondary | ICD-10-CM | POA: Insufficient documentation

## 2012-10-27 DIAGNOSIS — G8929 Other chronic pain: Secondary | ICD-10-CM | POA: Insufficient documentation

## 2012-11-01 ENCOUNTER — Ambulatory Visit: Payer: Managed Care, Other (non HMO) | Admitting: Physical Medicine and Rehabilitation

## 2012-11-25 ENCOUNTER — Other Ambulatory Visit: Payer: Self-pay | Admitting: Family Medicine

## 2012-12-31 NOTE — Progress Notes (Signed)
I spoke with April at [facility] office and she stated that the MRI T spine was denied by the patients insurance in Oct 2013 and needed to be cancelled. I cancelled exam 12/31/2012.

## 2012-12-31 NOTE — Addendum Note (Signed)
Addended by: Caffie Pinto on: 12/31/2012 04:18 PM   Modules accepted: Orders

## 2012-12-31 NOTE — Addendum Note (Signed)
Encounter addended by: Weston Settle Mattye Verdone on: 12/31/2012  4:17 PM<BR>     Documentation filed: Notes Section

## 2013-01-02 ENCOUNTER — Other Ambulatory Visit: Payer: Self-pay | Admitting: Family Medicine

## 2013-01-23 ENCOUNTER — Other Ambulatory Visit: Payer: Self-pay | Admitting: Cardiovascular Disease

## 2013-01-28 ENCOUNTER — Telehealth: Payer: Self-pay | Admitting: *Deleted

## 2013-01-28 NOTE — Telephone Encounter (Signed)
Keith Morrow has a question about an MRI that was ordered back in September-October 2013.

## 2013-02-01 NOTE — Telephone Encounter (Addendum)
Dr. Pamelia Hoit,  Patient has raised a question about the C-spine MRI he had in the fall, and feels it should have been a T-spine.  Based on my research into his chart it looks like when he was here for an appointment on 08/13/13 an T-spine x-ray was ordered, and completed on 08/16/13.  On that day, a C-spine MRI was ordered, which was completed on 09/10/13.  He came in for a visit on 09/13/13 and his x-ray and MRI were discussed with the patient, and at that time the T-spine MRI was ordered.  The T-spine MRI was not approved by his insurance, and therefore was never completed.  Patient believes that the cervical MRI was in error, and should have been of his thoracic spine, and feels he shouldn't have to pay his portion of the bill for that service.  There is no evidence that the C-spine MRI was ordered in error, but it is not clear a cervical MRI was ordered.  The patient contacted the Office of Patient Experience about it, and I am working with them to answer their questions.  Could you look at his chart and give me some more information as to why, clinically, you ordered the cervical MRI.  It will help them determine how to proceed with this patient.  Thank you.  Marisue Ivan

## 2013-03-20 ENCOUNTER — Other Ambulatory Visit: Payer: Self-pay | Admitting: Family Medicine

## 2013-03-21 ENCOUNTER — Telehealth: Payer: Self-pay | Admitting: Family Medicine

## 2013-03-21 MED ORDER — SYNTHROID 150 MCG PO TABS: 150.0000 ug | ORAL_TABLET | Freq: Every day | ORAL | Status: DC

## 2013-03-21 NOTE — Telephone Encounter (Signed)
SYNTHROID 150 MCG TABLET QTY 30 TAKE 1 TABLET BY MOUTH EVERY DAY

## 2013-04-21 ENCOUNTER — Encounter: Payer: Self-pay | Admitting: Family Medicine

## 2013-04-21 DIAGNOSIS — R7989 Other specified abnormal findings of blood chemistry: Secondary | ICD-10-CM

## 2013-04-21 NOTE — Telephone Encounter (Signed)
Please advise? Last RX was wrote on 10-15-12 quantity 10 ml with 2 refills  If ok fax to [phone]

## 2013-04-22 MED ORDER — TESTOSTERONE CYPIONATE 200 MG/ML IM SOLN: 200.0000 mg | INTRAMUSCULAR | Status: DC

## 2013-04-27 ENCOUNTER — Encounter: Payer: Self-pay | Admitting: Family Medicine

## 2013-05-05 ENCOUNTER — Encounter: Payer: Self-pay | Admitting: Family Medicine

## 2013-05-05 ENCOUNTER — Ambulatory Visit (INDEPENDENT_AMBULATORY_CARE_PROVIDER_SITE_OTHER): Payer: Managed Care, Other (non HMO) | Admitting: Family Medicine

## 2013-05-05 VITALS — BP 100/68 | HR 83 | Temp 98.3°F | Ht 75.0 in | Wt 300.1 lb

## 2013-05-05 DIAGNOSIS — I10 Essential (primary) hypertension: Secondary | ICD-10-CM

## 2013-05-05 DIAGNOSIS — J209 Acute bronchitis, unspecified: Secondary | ICD-10-CM

## 2013-05-05 MED ORDER — BENZONATATE 100 MG PO CAPS: 100.0000 mg | ORAL_CAPSULE | Freq: Three times a day (TID) | ORAL | Status: DC | PRN

## 2013-05-05 MED ORDER — AMOXICILLIN-POT CLAVULANATE 875-125 MG PO TABS: 1.0000 | ORAL_TABLET | Freq: Two times a day (BID) | ORAL | Status: DC

## 2013-05-05 MED ORDER — HYDROCOD POLST-CHLORPHEN POLST 10-8 MG/5ML PO LQCR: 5.0000 mL | Freq: Every evening | ORAL | Status: DC | PRN

## 2013-05-05 NOTE — Patient Instructions (Addendum)
Zinc such as Xicam, coldeeze, saline nasal flushes twice   Bronchitis Bronchitis is the body's way of reacting to injury and/or infection (inflammation) of the bronchi. Bronchi are the air tubes that extend from the windpipe into the lungs. If the inflammation becomes severe, it may cause shortness of breath. CAUSES  Inflammation may be caused by:  A virus.  Germs (bacteria).  Dust.  Allergens.  Pollutants and many other irritants. The cells lining the bronchial tree are covered with tiny hairs (cilia). These constantly beat upward, away from the lungs, toward the mouth. This keeps the lungs free of pollutants. When these cells become too irritated and are unable to do their job, mucus begins to develop. This causes the characteristic cough of bronchitis. The cough clears the lungs when the cilia are unable to do their job. Without either of these protective mechanisms, the mucus would settle in the lungs. Then you would develop pneumonia. Smoking is a common cause of bronchitis and can contribute to pneumonia. Stopping this habit is the single most important thing you can do to help yourself. TREATMENT   Your caregiver may prescribe an antibiotic if the cough is caused by bacteria. Also, medicines that open up your airways make it easier to breathe. Your caregiver may also recommend or prescribe an expectorant. It will loosen the mucus to be coughed up. Only take over-the-counter or prescription medicines for pain, discomfort, or fever as directed by your caregiver.  Removing whatever causes the problem (smoking, for example) is critical to preventing the problem from getting worse.  Cough suppressants may be prescribed for relief of cough symptoms.  Inhaled medicines may be prescribed to help with symptoms now and to help prevent problems from returning.  For those with recurrent (chronic) bronchitis, there may be a need for steroid medicines. SEEK IMMEDIATE MEDICAL CARE IF:    During treatment, you develop more pus-like mucus (purulent sputum).  You have a fever.  Your baby is older than 3 months with a rectal temperature of 102 F (38.9 C) or higher.  Your baby is 45 months old or younger with a rectal temperature of 100.4 F (38 C) or higher.  You become progressively more ill.  You have increased difficulty breathing, wheezing, or shortness of breath. It is necessary to seek immediate medical care if you are elderly or sick from any other disease. MAKE SURE YOU:   Understand these instructions.  Will watch your condition.  Will get help right away if you are not doing well or get worse. Document Released: 11/24/2005 Document Revised: 02/16/2012 Document Reviewed: 10/03/2008 [facility] Patient Information 2014 [place], [place].

## 2013-05-09 ENCOUNTER — Encounter: Payer: Self-pay | Admitting: Family Medicine

## 2013-05-09 DIAGNOSIS — J209 Acute bronchitis, unspecified: Secondary | ICD-10-CM

## 2013-05-09 HISTORY — DX: Acute bronchitis, unspecified: J20.9

## 2013-05-09 NOTE — Assessment & Plan Note (Signed)
Well controlled, no changes to meds 

## 2013-05-09 NOTE — Progress Notes (Signed)
Patient ID: Keith Morrow, male   DOB: 10-Apr-1971, 42 y.o.   MRN: [id number] Keith Morrow [id number] 09/17/1971 05/09/2013      Progress Note-Follow Up  Subjective  Chief Complaint  Chief Complaint  Patient presents with  . Cough    X 2 days w/ phlegm (green yesterday, yellow today)  . Headache    X 2 days  . chest congestion    X 4 days , sore throat, ears hurting    HPI  Patient is a 42 year old Caucasian male who is in today with a 2 to three-day history of worsening headache. He has nasal congestion in his nose productive of green phlegm. Has a cough which is productive for giving him up at night. She's having some shortness of breath and malaise. No fevers but some myalgias are noted. No GI complaints. He did not take any medications over-the-counter for this so far.  Past Medical History  Diagnosis Date  . HTN (hypertension)   . Hypothyroidism   . Hyperlipidemia   . MS (multiple sclerosis)   . Coronary atherosclerosis of native coronary artery     a. NSTEMI 04/20/11: tx with Promus DES to LAD; bifurcation lesion with oDx 90% tx with POBA; b. staged PCI of right post. AV Branch with Promus DES; c. residual at cath 04/21/11:  AV groove CFX 50%, mRCA 30%,; EF 55%  . Chicken pox as a child  . Heart attack 04-20-11  . Allergy   . Obesity   . Depression   . Onychomycosis   . Tinea pedis   . Keloid scar   . Preventative health care 10/07/2011  . Reflux 10/07/2011  . 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 Hypotestosteronism 11/17/2011  . Dermatitis, contact 12/10/2011  . Bronchitis, acute 12/10/2011  . Testosterone deficiency 01/16/2012  . Fatigue 10/15/2012  . Acute bronchitis 05/09/2013    Past Surgical History  Procedure Laterality Date  . Right wrist surgery      CTR  . Vasectomy    . Coronary angioplasty with stent placement    . Vascular surgery      vericose vein in right femoral area  . Wisdom tooth extraction      Family History  Problem Relation Age of  Onset  . Coronary artery disease      questionable in father  . Heart attack Mother 28  . Thyroid disease Mother   . Heart disease Mother   . Thyroid disease Sister   . Thyroid disease Brother   . Heart disease Maternal Grandmother   . Hypertension Maternal Grandmother   . Hyperlipidemia Maternal Grandmother   . Heart disease Maternal Grandfather   . Hypertension Maternal Grandfather   . Hyperlipidemia Maternal Grandfather   . Stroke Paternal Grandfather   . Thyroid disease Sister   . Heart disease Father     History   Social History  . Marital Status: Married    Spouse Name: N/A    Number of Children: 3  . Years of Education: N/A   Occupational History  . Not on file.   Social History Main Topics  . Smoking status: Never Smoker   . Smokeless tobacco: Never Used  . Alcohol Use: Yes     Comment: socially  . Drug Use: No  . Sexually Active: Yes -- Male partner(s)   Other Topics Concern  . Not on file   Social History Narrative  . No narrative on file    Current Outpatient  Prescriptions on File Prior to Visit  Medication Sig Dispense Refill  . aspirin 81 MG tablet Take 81 mg by mouth daily.        Marland Kitchen atorvastatin (LIPITOR) 40 MG tablet TAKE 1 TABLET (40 MG TOTAL) BY MOUTH DAILY.  30 tablet  10  . baclofen (LIORESAL) 10 MG tablet Take 20 mg by mouth 3 (three) times daily.       Marland Kitchen BENICAR 20 MG tablet TAKE 1 TABLET BY MOUTH EVERY DAY  30 tablet  4  . cetirizine (ZYRTEC) 10 MG tablet Take 10 mg by mouth as needed.        . Dalfampridine (AMPYRA) 10 MG TB12 Take by mouth. Every 12 hours       . DULoxetine (CYMBALTA) 60 MG capsule Take 1 capsule (60 mg total) by mouth 2 (two) times daily.  180 capsule  3  . LORazepam (ATIVAN) 1 MG tablet Take 1 tablet (1 mg total) by mouth every 6 (six) hours as needed.  90 tablet  2  . methylphenidate (RITALIN) 10 MG tablet Take 1 tablet (10 mg total) by mouth 2 (two) times daily.  60 tablet  0  . metoprolol tartrate (LOPRESSOR) 25  MG tablet TAKE 1 TABLET (25 MG TOTAL) BY MOUTH 2 (TWO) TIMES DAILY.  60 tablet  10  . sildenafil (VIAGRA) 50 MG tablet Take 1 tablet (50 mg total) by mouth daily as needed for erectile dysfunction.  10 tablet  0  . SYNTHROID 150 MCG tablet Take 1 tablet (150 mcg total) by mouth daily.  30 tablet  4  . terbinafine (LAMISIL) 250 MG tablet TAKE 1 TABLET BY MOUTH EVERY DAY  30 tablet  1  . testosterone cypionate (DEPOTESTOTERONE CYPIONATE) 200 MG/ML injection Inject 1 mL (200 mg total) into the muscle every 14 (fourteen) days. Every week for 3 weeks than skip a week  10 mL  2  . triamcinolone cream (KENALOG) 0.1 %        No current facility-administered medications on file prior to visit.    No Known Allergies  Review of Systems  Review of Systems  Constitutional: Positive for malaise/fatigue. Negative for fever.  HENT: Positive for congestion and sore throat.   Eyes: Negative for discharge.  Respiratory: Positive for cough, sputum production and shortness of breath.   Cardiovascular: Positive for chest pain. Negative for palpitations and leg swelling.  Gastrointestinal: Negative for nausea, abdominal pain and diarrhea.  Genitourinary: Negative for dysuria.  Musculoskeletal: Positive for myalgias. Negative for falls.  Skin: Negative for rash.  Neurological: Positive for headaches. Negative for loss of consciousness.  Endo/Heme/Allergies: Negative for polydipsia.  Psychiatric/Behavioral: Negative for depression and suicidal ideas. The patient is not nervous/anxious and does not have insomnia.     Objective  BP 100/68  Pulse 83  Temp(Src) 98.3 F (36.8 C) (Oral)  Ht 6\' 3"  (1.905 m)  Wt 300 lb 1.3 oz (136.115 kg)  BMI 37.51 kg/m2  SpO2 97%  Physical Exam  Physical Exam  Constitutional: He is oriented to person, place, and time and well-developed, well-nourished, and in no distress. No distress.  HENT:  Head: Normocephalic and atraumatic.  Eyes: Conjunctivae are normal.  Neck:  Neck supple. No thyromegaly present.  Cardiovascular: Normal rate, regular rhythm and normal heart sounds.   No murmur heard. Pulmonary/Chest: Effort normal. No respiratory distress. He has rales.  B/l bases  Abdominal: He exhibits no distension and no mass. There is no tenderness.  Musculoskeletal: He exhibits no  edema.  Neurological: He is alert and oriented to person, place, and time.  Skin: Skin is warm.  Psychiatric: Memory, affect and judgment normal.    Lab Results  Component Value Date   TSH 0.56 10/11/2012   Lab Results  Component Value Date   WBC 8.9 10/11/2012   HGB 15.2 10/11/2012   HCT 46.4 10/11/2012   MCV 90.4 10/11/2012   PLT 206.0 10/11/2012   Lab Results  Component Value Date   CREATININE 1.1 10/11/2012   BUN 14 10/11/2012   NA 140 10/11/2012   K 4.5 10/11/2012   CL 104 10/11/2012   CO2 31 10/11/2012   Lab Results  Component Value Date   ALT 40 10/11/2012   AST 25 10/11/2012   ALKPHOS 53 10/11/2012   BILITOT 0.7 10/11/2012   Lab Results  Component Value Date   CHOL 149 10/11/2012   Lab Results  Component Value Date   HDL 49.90 10/11/2012   Lab Results  Component Value Date   LDLCALC 88 10/11/2012   Lab Results  Component Value Date   TRIG 57.0 10/11/2012   Lab Results  Component Value Date   CHOLHDL 3 10/11/2012     Assessment & Plan  HTN (hypertension) Well controlled, no changes to meds  Acute bronchitis Augmentin, mucinex, probiotics. Increase rest, hydration

## 2013-05-09 NOTE — Assessment & Plan Note (Signed)
Augmentin, mucinex, probiotics. Increase rest, hydration

## 2013-05-19 ENCOUNTER — Telehealth: Payer: Self-pay | Admitting: Family Medicine

## 2013-05-19 MED ORDER — ALBUTEROL SULFATE HFA 108 (90 BASE) MCG/ACT IN AERS: 2.0000 | INHALATION_SPRAY | Freq: Four times a day (QID) | RESPIRATORY_TRACT | Status: DC | PRN

## 2013-05-19 MED ORDER — METHYLPREDNISOLONE 4 MG PO KIT: PACK | ORAL | Status: DC

## 2013-05-19 NOTE — Telephone Encounter (Signed)
RX's sent into pharmacy.   Pt informed

## 2013-05-19 NOTE — Telephone Encounter (Signed)
So if he wants a medrol dosepak (use as directed) and an albuterol inhaler (2 puffs po qid prn cough, wheeze, sob) to use over night til he sees [place] tomorrow we can send that in.

## 2013-05-19 NOTE — Telephone Encounter (Signed)
Patient Information:  Caller Name: Ellison  Phone: [phone]  Patient: Keith Morrow, Keith Morrow  Gender: Male  DOB: Mar 02, 1971  Age: 42 Years  PCP: [name])  Office Follow Up:  Does the office need to follow up with this patient?: Yes  Instructions For The Office: Please call back ASAP regarding work-in appointment 05/19/13.  RN Note:  Intermttent wheezing while coughing.  Hard coughing spells causing near syncope. No apointments remain at [facility] office.  Willing to be seen at [facility] office.  Message sent to office staff for call back ASAP regarding work-in appointment 05/19/13.   Symptoms  Reason For Call & Symptoms: Ongoing cough with chest congestion.  Reviewed Health History In EMR: Yes  Reviewed Medications In EMR: Yes  Reviewed Allergies In EMR: Yes  Reviewed Surgeries / Procedures: Yes  Date of Onset of Symptoms: 05/03/2013  Treatments Tried: Augmentin, Mucinex, rest, hydration  Treatments Tried Worked: Yes  Guideline(s) Used:  Cough  Disposition Per Guideline:   Go to Office Now  Reason For Disposition Reached:   Wheezing is present  Advice Given:  Reassurance  Coughing is the way that our lungs remove irritants and mucus. It helps protect our lungs from getting pneumonia.  Coughing Spasms:  Drink warm fluids. Inhale warm mist (Reason: both relax the airway and loosen up the phlegm).  Suck on cough drops or hard candy to coat the irritated throat.  Expected Course:   The expected course depends on what is causing the cough.  Viral bronchitis (chest cold) causes a cough that lasts 1 to 3 weeks. Sometimes you may cough up lots of phlegm (sputum, mucus). The mucus can normally be white, gray, yellow, or green.  Call Back If:  Difficulty breathing  Cough lasts more than 3 weeks  Fever lasts > 3 days  You become worse.  Patient Will Follow Care Advice:  YES

## 2013-05-19 NOTE — Telephone Encounter (Signed)
Spoke with pt re: below concerns. He states that he does not feel any improvement since treatment for bronchitis on 05/05/13. Advised pt of need to be evaluated in urgent care. He refuses to go to urgent care. Gave pt appt for tomorrow at 3pm and advised him that if he worsens before then that he should seek urgent care or ED evaluation.

## 2013-05-20 ENCOUNTER — Ambulatory Visit (INDEPENDENT_AMBULATORY_CARE_PROVIDER_SITE_OTHER): Payer: Managed Care, Other (non HMO) | Admitting: Family Medicine

## 2013-05-20 ENCOUNTER — Ambulatory Visit (HOSPITAL_BASED_OUTPATIENT_CLINIC_OR_DEPARTMENT_OTHER)
Admission: RE | Admit: 2013-05-20 | Discharge: 2013-05-20 | Disposition: A | Discharge status: Home / Self Care | Payer: Managed Care, Other (non HMO) | Source: Ambulatory Visit | Attending: Family Medicine | Admitting: Family Medicine

## 2013-05-20 ENCOUNTER — Encounter: Payer: Self-pay | Admitting: Family Medicine

## 2013-05-20 VITALS — BP 108/70 | HR 70 | Temp 98.8°F | Ht 75.0 in

## 2013-05-20 DIAGNOSIS — R0989 Other specified symptoms and signs involving the circulatory and respiratory systems: Secondary | ICD-10-CM | POA: Insufficient documentation

## 2013-05-20 DIAGNOSIS — R05 Cough: Secondary | ICD-10-CM | POA: Insufficient documentation

## 2013-05-20 DIAGNOSIS — J209 Acute bronchitis, unspecified: Secondary | ICD-10-CM

## 2013-05-20 DIAGNOSIS — I10 Essential (primary) hypertension: Secondary | ICD-10-CM | POA: Insufficient documentation

## 2013-05-20 DIAGNOSIS — R059 Cough, unspecified: Secondary | ICD-10-CM | POA: Insufficient documentation

## 2013-05-20 MED ORDER — CIPROFLOXACIN HCL 500 MG PO TABS: 500.0000 mg | ORAL_TABLET | Freq: Two times a day (BID) | ORAL | Status: DC

## 2013-05-20 MED ORDER — HYDROCOD POLST-CHLORPHEN POLST 10-8 MG/5ML PO LQCR: 5.0000 mL | Freq: Two times a day (BID) | ORAL | Status: DC | PRN

## 2013-05-20 MED ORDER — FLUTICASONE PROPIONATE HFA 110 MCG/ACT IN AERO: 2.0000 | INHALATION_SPRAY | Freq: Two times a day (BID) | RESPIRATORY_TRACT | Status: DC

## 2013-05-20 MED ORDER — METHYLPREDNISOLONE ACETATE 40 MG/ML IJ SUSP
40.0000 mg | Freq: Once | INTRAMUSCULAR | Status: AC
  Administered 2013-05-20: 40 mg via INTRAMUSCULAR

## 2013-05-20 NOTE — Patient Instructions (Signed)

## 2013-05-21 ENCOUNTER — Encounter: Payer: Self-pay | Admitting: Family Medicine

## 2013-05-21 NOTE — Assessment & Plan Note (Signed)
Well controlled, no changes 

## 2013-05-21 NOTE — Assessment & Plan Note (Signed)
Not responding to recnet treatment. Given a dose of Depo Medrol in office and then will start a Medrol dose pak tomorrow. Given a sample of Flovent 110 mcg 2 puffs po bid and switched to Ciproflxacin. Continue Mucinex. Continue probiotic.

## 2013-05-21 NOTE — Progress Notes (Signed)
Patient ID: Keith Morrow, male   DOB: [birth year], 42 y.o.   MRN: [id number] COBY SHREWSBERRY [id number] 27-Sep-1971 05/21/2013      Progress Note-Follow Up  Subjective  Chief Complaint  Chief Complaint  Patient presents with  . Cough    HPI  Patient is a 42 year old Caucasian male who is in today with persistent bronchitis. He has not responded to the Augmentin. Has been using his last albuterol with some temporary relief. He struggles with fatigue and malaise cough and shortness of breath. Some fevers and chills intermittently but these are mild. He has chest pain with significant coughing and is having trouble sleeping. No GI or GU complaints noted today.  Past Medical History  Diagnosis Date  . HTN (hypertension)   . Hypothyroidism   . Hyperlipidemia   . MS (multiple sclerosis)   . Coronary atherosclerosis of native coronary artery     a. NSTEMI 04/20/11: tx with Promus DES to LAD; bifurcation lesion with oDx 90% tx with POBA; b. staged PCI of right post. AV Branch with Promus DES; c. residual at cath 04/21/11:  AV groove CFX 50%, mRCA 30%,; EF 55%  . Chicken pox as a child  . Heart attack 04-20-11  . Allergy   . Obesity   . Depression   . Onychomycosis   . Tinea pedis   . Keloid scar   . Preventative health care 10/07/2011  . Reflux 10/07/2011  . 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 Hypotestosteronism 11/17/2011  . Dermatitis, contact 12/10/2011  . Bronchitis, acute 12/10/2011  . Testosterone deficiency 01/16/2012  . Fatigue 10/15/2012  . Acute bronchitis 05/09/2013    Past Surgical History  Procedure Laterality Date  . Right wrist surgery      CTR  . Vasectomy    . Coronary angioplasty with stent placement    . Vascular surgery      vericose vein in right femoral area  . Wisdom tooth extraction      Family History  Problem Relation Age of Onset  . Coronary artery disease      questionable in father  . Heart attack Mother 48  . Thyroid disease Mother   .  Heart disease Mother   . Thyroid disease Sister   . Thyroid disease Brother   . Heart disease Maternal Grandmother   . Hypertension Maternal Grandmother   . Hyperlipidemia Maternal Grandmother   . Heart disease Maternal Grandfather   . Hypertension Maternal Grandfather   . Hyperlipidemia Maternal Grandfather   . Stroke Paternal Grandfather   . Thyroid disease Sister   . Heart disease Father     History   Social History  . Marital Status: Married    Spouse Name: N/A    Number of Children: 3  . Years of Education: N/A   Occupational History  . Not on file.   Social History Main Topics  . Smoking status: Never Smoker   . Smokeless tobacco: Never Used  . Alcohol Use: Yes     Comment: socially  . Drug Use: No  . Sexually Active: Yes -- Male partner(s)   Other Topics Concern  . Not on file   Social History Narrative  . No narrative on file    Current Outpatient Prescriptions on File Prior to Visit  Medication Sig Dispense Refill  . aspirin 81 MG tablet Take 81 mg by mouth daily.        Marland Kitchen atorvastatin (LIPITOR) 40 MG tablet TAKE 1  TABLET (40 MG TOTAL) BY MOUTH DAILY.  30 tablet  10  . baclofen (LIORESAL) 10 MG tablet Take 20 mg by mouth 3 (three) times daily.       Marland Kitchen BENICAR 20 MG tablet TAKE 1 TABLET BY MOUTH EVERY DAY  30 tablet  4  . cetirizine (ZYRTEC) 10 MG tablet Take 10 mg by mouth as needed.        . Dalfampridine (AMPYRA) 10 MG TB12 Take by mouth. Every 12 hours       . DULoxetine (CYMBALTA) 60 MG capsule Take 1 capsule (60 mg total) by mouth 2 (two) times daily.  180 capsule  3  . LORazepam (ATIVAN) 1 MG tablet Take 1 tablet (1 mg total) by mouth every 6 (six) hours as needed.  90 tablet  2  . methylphenidate (RITALIN) 10 MG tablet Take 1 tablet (10 mg total) by mouth 2 (two) times daily.  60 tablet  0  . metoprolol tartrate (LOPRESSOR) 25 MG tablet TAKE 1 TABLET (25 MG TOTAL) BY MOUTH 2 (TWO) TIMES DAILY.  60 tablet  10  . sildenafil (VIAGRA) 50 MG tablet  Take 1 tablet (50 mg total) by mouth daily as needed for erectile dysfunction.  10 tablet  0  . SYNTHROID 150 MCG tablet Take 1 tablet (150 mcg total) by mouth daily.  30 tablet  4  . terbinafine (LAMISIL) 250 MG tablet TAKE 1 TABLET BY MOUTH EVERY DAY  30 tablet  1  . testosterone cypionate (DEPOTESTOTERONE CYPIONATE) 200 MG/ML injection Inject 1 mL (200 mg total) into the muscle every 14 (fourteen) days. Every week for 3 weeks than skip a week  10 mL  2  . albuterol (PROVENTIL HFA;VENTOLIN HFA) 108 (90 BASE) MCG/ACT inhaler Inhale 2 puffs into the lungs 4 (four) times daily as needed for wheezing.  1 Inhaler  0  . methylPREDNISolone (MEDROL DOSEPAK) 4 MG tablet follow package directions  21 tablet  0   No current facility-administered medications on file prior to visit.    No Known Allergies  Review of Systems  Review of Systems  Constitutional: Positive for fever, chills and malaise/fatigue.  HENT: Positive for congestion. Negative for hearing loss and nosebleeds.   Eyes: Negative for discharge.  Respiratory: Positive for cough, sputum production and shortness of breath. Negative for wheezing.   Cardiovascular: Negative for chest pain, palpitations and leg swelling.  Gastrointestinal: Negative for heartburn, nausea, vomiting, abdominal pain, diarrhea, constipation and blood in stool.  Genitourinary: Negative for dysuria, urgency, frequency and hematuria.  Musculoskeletal: Negative for myalgias, back pain and falls.  Skin: Negative for rash.  Neurological: Negative for dizziness, tremors, sensory change, focal weakness, loss of consciousness, weakness and headaches.  Endo/Heme/Allergies: Negative for polydipsia. Does not bruise/bleed easily.  Psychiatric/Behavioral: Negative for depression and suicidal ideas. The patient is not nervous/anxious and does not have insomnia.     Objective  BP 108/70  Pulse 70  Temp(Src) 98.8 F (37.1 C) (Oral)  Ht 6\' 3"  (1.905 m)  SpO2  98%  Physical Exam  Physical Exam  Constitutional: He is oriented to person, place, and time and well-developed, well-nourished, and in no distress. No distress.  HENT:  Head: Normocephalic and atraumatic.  Eyes: Conjunctivae are normal.  Neck: Neck supple. No thyromegaly present.  Cardiovascular: Normal rate, regular rhythm and normal heart sounds.   No murmur heard. Pulmonary/Chest: Effort normal. No respiratory distress.  Decreased breath sounds b/l bases r>l  Abdominal: Soft. Bowel sounds are normal. He  exhibits no distension and no mass. There is no tenderness.  Musculoskeletal: He exhibits no edema.  Neurological: He is alert and oriented to person, place, and time.  Skin: Skin is warm.  Psychiatric: Memory, affect and judgment normal.    Lab Results  Component Value Date   TSH 0.56 10/11/2012   Lab Results  Component Value Date   WBC 8.9 10/11/2012   HGB 15.2 10/11/2012   HCT 46.4 10/11/2012   MCV 90.4 10/11/2012   PLT 206.0 10/11/2012   Lab Results  Component Value Date   CREATININE 1.1 10/11/2012   BUN 14 10/11/2012   NA 140 10/11/2012   K 4.5 10/11/2012   CL 104 10/11/2012   CO2 31 10/11/2012   Lab Results  Component Value Date   ALT 40 10/11/2012   AST 25 10/11/2012   ALKPHOS 53 10/11/2012   BILITOT 0.7 10/11/2012   Lab Results  Component Value Date   CHOL 149 10/11/2012   Lab Results  Component Value Date   HDL 49.90 10/11/2012   Lab Results  Component Value Date   LDLCALC 88 10/11/2012   Lab Results  Component Value Date   TRIG 57.0 10/11/2012   Lab Results  Component Value Date   CHOLHDL 3 10/11/2012     Assessment & Plan  HTN (hypertension) Well controlled, no changes.   Acute bronchitis Not responding to recnet treatment. Given a dose of Depo Medrol in office and then will start a Medrol dose pak tomorrow. Given a sample of Flovent 110 mcg 2 puffs po bid and switched to Ciproflxacin. Continue Mucinex. Continue probiotic.

## 2013-05-31 ENCOUNTER — Encounter: Payer: Self-pay | Admitting: Family Medicine

## 2013-06-01 ENCOUNTER — Encounter: Payer: Self-pay | Admitting: Family Medicine

## 2013-06-02 MED ORDER — CEFDINIR 300 MG PO CAPS: 300.0000 mg | ORAL_CAPSULE | Freq: Two times a day (BID) | ORAL | Status: DC

## 2013-06-02 MED ORDER — METHYLPREDNISOLONE 4 MG PO KIT: PACK | ORAL | Status: DC

## 2013-06-02 NOTE — Telephone Encounter (Signed)
Please advise which antibiotic?

## 2013-06-02 NOTE — Telephone Encounter (Signed)
Please send him in a Medrol dosepak refill and Cefdinir 300 mg po bid x 10 days to [facility]

## 2013-06-09 ENCOUNTER — Other Ambulatory Visit: Payer: Self-pay | Admitting: *Deleted

## 2013-06-09 MED ORDER — TERBINAFINE HCL 250 MG PO TABS: ORAL_TABLET | ORAL | Status: DC

## 2013-06-09 NOTE — Telephone Encounter (Signed)
Rx request to pharmacy/[facility]  

## 2013-06-28 ENCOUNTER — Encounter: Payer: Self-pay | Admitting: Cardiovascular Disease

## 2013-07-06 ENCOUNTER — Ambulatory Visit: Payer: Managed Care, Other (non HMO) | Admitting: Cardiovascular Disease

## 2013-07-07 ENCOUNTER — Other Ambulatory Visit: Payer: Self-pay | Admitting: Family Medicine

## 2013-07-28 ENCOUNTER — Encounter: Payer: Self-pay | Admitting: Family Medicine

## 2013-07-29 NOTE — Telephone Encounter (Signed)
I'm putting this message in the patients chart.

## 2013-08-12 ENCOUNTER — Ambulatory Visit (INDEPENDENT_AMBULATORY_CARE_PROVIDER_SITE_OTHER): Payer: Managed Care, Other (non HMO) | Admitting: Cardiovascular Disease

## 2013-08-12 ENCOUNTER — Encounter: Payer: Self-pay | Admitting: Cardiovascular Disease

## 2013-08-12 VITALS — BP 102/68 | HR 64 | Ht 75.0 in | Wt 281.4 lb

## 2013-08-12 DIAGNOSIS — I10 Essential (primary) hypertension: Secondary | ICD-10-CM

## 2013-08-12 DIAGNOSIS — I2510 Atherosclerotic heart disease of native coronary artery without angina pectoris: Secondary | ICD-10-CM

## 2013-08-12 DIAGNOSIS — E785 Hyperlipidemia, unspecified: Secondary | ICD-10-CM

## 2013-08-12 MED ORDER — NITROGLYCERIN 0.4 MG SL SUBL: 0.4000 mg | SUBLINGUAL_TABLET | SUBLINGUAL | Status: DC | PRN

## 2013-08-12 NOTE — Progress Notes (Signed)
HPI:  42 year-old gentleman presenting for follow-up cardiac evaluation. He has premature CAD and presented with NSTEMI in 2012. He underwent stenting of the proximal LAD and PTCA of the diagonal followed by staged PCI with stenting of the posterior AV segment of the RCA at that time. He's had no recurrence of angina since then. He is limited by multiple sclerosis. He complains of generalized fatigue, but has had no chest pain or dyspnea. Has mild leg swelling. No other complaints.   Last stress test was in May 2013 and showed normal LV function with normal perfusion.  Outpatient Encounter Prescriptions as of 08/12/2013  Medication Sig Dispense Refill  . albuterol (PROVENTIL HFA;VENTOLIN HFA) 108 (90 BASE) MCG/ACT inhaler Inhale 2 puffs into the lungs 4 (four) times daily as needed for wheezing.  1 Inhaler  0  . aspirin 81 MG tablet Take 81 mg by mouth daily.        Marland Kitchen atorvastatin (LIPITOR) 40 MG tablet TAKE 1 TABLET (40 MG TOTAL) BY MOUTH DAILY.  30 tablet  10  . baclofen (LIORESAL) 10 MG tablet Take 20 mg by mouth 3 (three) times daily.       Marland Kitchen BENICAR 20 MG tablet TAKE 1 TABLET BY MOUTH EVERY DAY  30 tablet  4  . cetirizine (ZYRTEC) 10 MG tablet Take 10 mg by mouth as needed.        . chlorpheniramine-HYDROcodone (TUSSIONEX PENNKINETIC ER) 10-8 MG/5ML LQCR Take 5 mLs by mouth every 12 (twelve) hours as needed.  200 mL  1  . Dalfampridine (AMPYRA) 10 MG TB12 Take by mouth. Every 12 hours       . DULoxetine (CYMBALTA) 60 MG capsule Take 1 capsule (60 mg total) by mouth 2 (two) times daily.  180 capsule  3  . fluticasone (FLOVENT HFA) 110 MCG/ACT inhaler Inhale 2 puffs into the lungs 2 (two) times daily.  1 Inhaler  12  . LORazepam (ATIVAN) 1 MG tablet Take 1 tablet (1 mg total) by mouth every 6 (six) hours as needed.  90 tablet  2  . methylphenidate (RITALIN) 10 MG tablet Take 10 mg by mouth 2 (two) times daily as needed.      . metoprolol tartrate (LOPRESSOR) 25 MG tablet TAKE 1 TABLET (25 MG  TOTAL) BY MOUTH 2 (TWO) TIMES DAILY.  60 tablet  10  . sildenafil (VIAGRA) 50 MG tablet Take 1 tablet (50 mg total) by mouth daily as needed for erectile dysfunction.  10 tablet  0  . SYNTHROID 150 MCG tablet Take 1 tablet (150 mcg total) by mouth daily.  30 tablet  4  . terbinafine (LAMISIL) 250 MG tablet TAKE 1 TABLET BY MOUTH EVERY DAY  30 tablet  0  . testosterone cypionate (DEPOTESTOTERONE CYPIONATE) 200 MG/ML injection Inject 200 mg into the muscle every 28 (twenty-eight) days. Every week for 3 weeks than skip a week      . [DISCONTINUED] methylphenidate (RITALIN) 10 MG tablet Take 1 tablet (10 mg total) by mouth 2 (two) times daily.  60 tablet  0  . [DISCONTINUED] methylPREDNISolone (MEDROL DOSEPAK) 4 MG tablet follow package directions  21 tablet  0  . [DISCONTINUED] cefdinir (OMNICEF) 300 MG capsule Take 1 capsule (300 mg total) by mouth 2 (two) times daily. X 10 days  20 capsule  0  . [DISCONTINUED] CELEBREX 200 MG capsule       . [DISCONTINUED] ciprofloxacin (CIPRO) 500 MG tablet Take 1 tablet (500 mg total) by mouth  2 (two) times daily.  20 tablet  0  . [DISCONTINUED] terbinafine (LAMISIL) 250 MG tablet TAKE 1 TABLET BY MOUTH EVERY DAY  30 tablet  1  . [DISCONTINUED] testosterone cypionate (DEPOTESTOTERONE CYPIONATE) 200 MG/ML injection Inject 1 mL (200 mg total) into the muscle every 14 (fourteen) days. Every week for 3 weeks than skip a week  10 mL  2   No facility-administered encounter medications on file as of 08/12/2013.    No Known Allergies  Past Medical History  Diagnosis Date  . HTN (hypertension)   . Hypothyroidism   . Hyperlipidemia   . MS (multiple sclerosis)   . Coronary atherosclerosis of native coronary artery     a. NSTEMI 04/20/11: tx with Promus DES to LAD; bifurcation lesion with oDx 90% tx with POBA; b. staged PCI of right post. AV Branch with Promus DES; c. residual at cath 04/21/11:  AV groove CFX 50%, mRCA 30%,; EF 55%  . Chicken pox as a child  . Heart  attack 04-20-11  . Allergy   . Obesity   . Depression   . Onychomycosis   . Tinea pedis   . Keloid scar   . Preventative health care 10/07/2011  . Reflux 10/07/2011  . 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 Hypotestosteronism 11/17/2011  . Dermatitis, contact 12/10/2011  . Bronchitis, acute 12/10/2011  . Testosterone deficiency 01/16/2012  . Fatigue 10/15/2012  . Acute bronchitis 05/09/2013    ROS: Positive for erectile dysfunction, weakness, otherwise negative except as per HPI  BP 102/68  Pulse 64  Ht 6\' 3"  (1.905 m)  Wt 281 lb 6.4 oz (127.642 kg)  BMI 35.17 kg/m2  PHYSICAL EXAM: Pt is alert and oriented, NAD HEENT: normal Neck: JVP - normal, carotids 2+= without bruits Lungs: CTA bilaterally CV: RRR without murmur or gallop Abd: soft, NT, Positive BS, no hepatomegaly Ext: no C/C/E, distal pulses intact and equal Skin: warm/dry no rash  EKG:  NSR 64 bpm, within normal limits  ASSESSMENT AND PLAN: 1. CAD s/p multivessel PCI (drug-eluting stents). He is doing well. No indication for further testing at this point. With normal LV function, now 2 years out from his ACS event, will stop his beta blocker because of fatigue and ED. Will see him back in one year. He will call sooner if problems arise.  2. Hyperlipidemia. Lipids from November 2013 reviewed and show LDL of 88 and HDL of 50. Continue atorvastatin.   Tonny Bollman 08/12/2013 11:42 AM

## 2013-08-12 NOTE — Patient Instructions (Addendum)
Your physician has recommended you make the following change in your medication: STOP Metoprolol Tartrate  Your physician wants you to follow-up in: 1 YEAR with Dr Excell Seltzer.  You will receive a reminder letter in the mail two months in advance. If you don't receive a letter, please call our office to schedule the follow-up appointment.

## 2013-08-27 ENCOUNTER — Other Ambulatory Visit: Payer: Self-pay | Admitting: Family Medicine

## 2013-09-19 ENCOUNTER — Other Ambulatory Visit: Payer: Self-pay | Admitting: *Deleted

## 2013-09-19 MED ORDER — OLMESARTAN MEDOXOMIL 20 MG PO TABS: ORAL_TABLET | ORAL | Status: DC

## 2013-09-19 NOTE — Telephone Encounter (Signed)
Rx request to pharmacy/[facility]  

## 2013-10-13 ENCOUNTER — Other Ambulatory Visit: Payer: Self-pay

## 2013-10-13 ENCOUNTER — Other Ambulatory Visit: Payer: Self-pay | Admitting: Family Medicine

## 2013-11-01 ENCOUNTER — Ambulatory Visit (INDEPENDENT_AMBULATORY_CARE_PROVIDER_SITE_OTHER): Payer: Managed Care, Other (non HMO) | Admitting: Family Medicine

## 2013-11-01 ENCOUNTER — Other Ambulatory Visit: Payer: Self-pay | Admitting: Family Medicine

## 2013-11-01 ENCOUNTER — Encounter: Payer: Self-pay | Admitting: Family Medicine

## 2013-11-01 ENCOUNTER — Telehealth: Payer: Self-pay | Admitting: Family Medicine

## 2013-11-01 VITALS — BP 110/74 | HR 89 | Temp 98.0°F | Ht 75.0 in | Wt 276.0 lb

## 2013-11-01 DIAGNOSIS — G35 Multiple sclerosis: Secondary | ICD-10-CM

## 2013-11-01 DIAGNOSIS — I10 Essential (primary) hypertension: Secondary | ICD-10-CM

## 2013-11-01 DIAGNOSIS — F341 Dysthymic disorder: Secondary | ICD-10-CM

## 2013-11-01 DIAGNOSIS — F329 Major depressive disorder, single episode, unspecified: Secondary | ICD-10-CM

## 2013-11-01 DIAGNOSIS — E669 Obesity, unspecified: Secondary | ICD-10-CM

## 2013-11-01 DIAGNOSIS — N529 Male erectile dysfunction, unspecified: Secondary | ICD-10-CM

## 2013-11-01 DIAGNOSIS — I2510 Atherosclerotic heart disease of native coronary artery without angina pectoris: Secondary | ICD-10-CM

## 2013-11-01 DIAGNOSIS — Z23 Encounter for immunization: Secondary | ICD-10-CM

## 2013-11-01 DIAGNOSIS — Z0000 Encounter for general adult medical examination without abnormal findings: Secondary | ICD-10-CM

## 2013-11-01 DIAGNOSIS — E785 Hyperlipidemia, unspecified: Secondary | ICD-10-CM

## 2013-11-01 DIAGNOSIS — F419 Anxiety disorder, unspecified: Secondary | ICD-10-CM

## 2013-11-01 LAB — LIPID PANEL
Cholesterol: 151 mg/dL (ref 0–200)
HDL: 60 mg/dL (ref >39)
LDL Cholesterol: 80 mg/dL (ref 0–99)
Total CHOL/HDL Ratio: 2.5 Ratio
Triglycerides: 54 mg/dL (ref <150)
VLDL: 11 mg/dL (ref 0–40)

## 2013-11-01 LAB — HEPATIC FUNCTION PANEL
ALT: 28 U/L (ref 0–53)
AST: 18 U/L (ref 0–37)
Albumin: 4.8 g/dL (ref 3.5–5.2)
Alkaline Phosphatase: 62 U/L (ref 39–117)
Bilirubin, Direct: 0.1 mg/dL (ref 0.0–0.3)
Indirect Bilirubin: 0.4 mg/dL (ref 0.0–0.9)
Total Bilirubin: 0.5 mg/dL (ref 0.3–1.2)
Total Protein: 6.9 g/dL (ref 6.0–8.3)

## 2013-11-01 LAB — RENAL FUNCTION PANEL
Albumin: 4.8 g/dL (ref 3.5–5.2)
BUN: 9 mg/dL (ref 6–23)
CO2: 30 mEq/L (ref 19–32)
Calcium: 9.6 mg/dL (ref 8.4–10.5)
Chloride: 104 mEq/L (ref 96–112)
Creat: 0.96 mg/dL (ref 0.50–1.35)
Glucose, Bld: 87 mg/dL (ref 70–99)
Phosphorus: 3.7 mg/dL (ref 2.3–4.6)
Potassium: 4.5 mEq/L (ref 3.5–5.3)
Sodium: 141 mEq/L (ref 135–145)

## 2013-11-01 LAB — CBC
HCT: 44.3 % (ref 39.0–52.0)
Hemoglobin: 15.3 g/dL (ref 13.0–17.0)
MCH: 29.6 pg (ref 26.0–34.0)
MCHC: 34.5 g/dL (ref 30.0–36.0)
MCV: 85.7 fL (ref 78.0–100.0)
Platelets: 207 10*3/uL (ref 150–400)
RBC: 5.17 MIL/uL (ref 4.22–5.81)
RDW: 14.8 % (ref 11.5–15.5)
WBC: 7.7 10*3/uL (ref 4.0–10.5)

## 2013-11-01 MED ORDER — LORAZEPAM 1 MG PO TABS: 1.0000 mg | ORAL_TABLET | Freq: Four times a day (QID) | ORAL | Status: DC | PRN

## 2013-11-01 MED ORDER — VARDENAFIL HCL 10 MG PO TABS: 10.0000 mg | ORAL_TABLET | Freq: Every day | ORAL | Status: DC | PRN

## 2013-11-01 MED ORDER — OLMESARTAN MEDOXOMIL 20 MG PO TABS: ORAL_TABLET | ORAL | Status: DC

## 2013-11-01 NOTE — Patient Instructions (Signed)

## 2013-11-01 NOTE — Telephone Encounter (Signed)
Patient with appt this afternoon

## 2013-11-01 NOTE — Progress Notes (Signed)
Patient ID: Keith Morrow, male   DOB: 22-Aug-1971, 42 y.o.   MRN: [id number] Keith Morrow [id number] Sep 17, 1971 11/01/2013      Progress Note-Follow Up  Subjective  Chief Complaint  Chief Complaint  Patient presents with  . Annual Exam    physical    HPI  Patient is a 42 year old male in today with his wife for annual exam. He has multiple sclerosis but is doing very well at home. Has been having some trouble with the skin underneath his right foot brace but otherwise reports no troubles. Hasn't had a Lamisil for his onychomycosis does have minimal improvement. No other acute recent illness. No chest pain, palpitations or shortness of breath. No fevers or chills, headaches GI or GU concerns noted today.  Past Medical History  Diagnosis Date  . HTN (hypertension)   . Hypothyroidism   . Hyperlipidemia   . MS (multiple sclerosis)   . Coronary atherosclerosis of native coronary artery     a. NSTEMI 04/20/11: tx with Promus DES to LAD; bifurcation lesion with oDx 90% tx with POBA; b. staged PCI of right post. AV Branch with Promus DES; c. residual at cath 04/21/11:  AV groove CFX 50%, mRCA 30%,; EF 55%  . Chicken pox as a child  . Heart attack 04-20-11  . Allergy   . Obesity   . Depression   . Onychomycosis   . Tinea pedis   . Keloid scar   . Preventative health care 10/07/2011  . Reflux 10/07/2011  . 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 Hypotestosteronism 11/17/2011  . Dermatitis, contact 12/10/2011  . Bronchitis, acute 12/10/2011  . Testosterone deficiency 01/16/2012  . Fatigue 10/15/2012  . Acute bronchitis 05/09/2013    Past Surgical History  Procedure Laterality Date  . Right wrist surgery      CTR  . Vasectomy    . Coronary angioplasty with stent placement    . Vascular surgery      vericose vein in right femoral area  . Wisdom tooth extraction      Family History  Problem Relation Age of Onset  . Coronary artery disease      questionable in father  . Heart  attack Mother 58  . Thyroid disease Mother   . Heart disease Mother   . Thyroid disease Sister   . Thyroid disease Brother   . Heart disease Maternal Grandmother   . Hypertension Maternal Grandmother   . Hyperlipidemia Maternal Grandmother   . Heart disease Maternal Grandfather   . Hypertension Maternal Grandfather   . Hyperlipidemia Maternal Grandfather   . Stroke Paternal Grandfather   . Thyroid disease Sister   . Heart disease Father     2 stents    History   Social History  . Marital Status: Married    Spouse Name: N/A    Number of Children: 3  . Years of Education: N/A   Occupational History  . Not on file.   Social History Main Topics  . Smoking status: Never Smoker   . Smokeless tobacco: Never Used  . Alcohol Use: Yes     Comment: socially  . Drug Use: No  . Sexual Activity: Yes    Partners: Female   Other Topics Concern  . Not on file   Social History Narrative  . No narrative on file    Current Outpatient Prescriptions on File Prior to Visit  Medication Sig Dispense Refill  . aspirin 81 MG tablet Take  81 mg by mouth daily.        Marland Kitchen atorvastatin (LIPITOR) 40 MG tablet TAKE 1 TABLET (40 MG TOTAL) BY MOUTH DAILY.  30 tablet  10  . baclofen (LIORESAL) 10 MG tablet Take 20 mg by mouth 4 (four) times daily.       . cetirizine (ZYRTEC) 10 MG tablet Take 10 mg by mouth as needed.        . chlorpheniramine-HYDROcodone (TUSSIONEX PENNKINETIC ER) 10-8 MG/5ML LQCR Take 5 mLs by mouth every 12 (twelve) hours as needed.  200 mL  1  . Dalfampridine (AMPYRA) 10 MG TB12 Take by mouth. Every 12 hours       . DULoxetine (CYMBALTA) 60 MG capsule Take 1 capsule (60 mg total) by mouth 2 (two) times daily.  180 capsule  3  . nitroGLYCERIN (NITROSTAT) 0.4 MG SL tablet Place 1 tablet (0.4 mg total) under the tongue every 5 (five) minutes as needed for chest pain.  25 tablet  3  . SYNTHROID 150 MCG tablet TAKE 1 TABLET (150 MCG TOTAL) BY MOUTH DAILY.  30 tablet  4  . terbinafine  (LAMISIL) 250 MG tablet TAKE 1 TABLET BY MOUTH EVERY DAY  30 tablet  1  . testosterone cypionate (DEPOTESTOTERONE CYPIONATE) 200 MG/ML injection Inject 200 mg into the muscle every 28 (twenty-eight) days. Every week for 3 weeks than skip a week      . methylphenidate (RITALIN) 10 MG tablet Take 10 mg by mouth 2 (two) times daily as needed.       No current facility-administered medications on file prior to visit.    No Known Allergies  Review of Systems  Review of Systems  Constitutional: Negative for fever and malaise/fatigue.  HENT: Negative for congestion.   Eyes: Negative for discharge.  Respiratory: Negative for shortness of breath.   Cardiovascular: Negative for chest pain, palpitations and leg swelling.  Gastrointestinal: Negative for nausea, abdominal pain and diarrhea.  Genitourinary: Negative for dysuria.  Musculoskeletal: Negative for falls.  Skin: Negative for rash.  Neurological: Negative for loss of consciousness and headaches.  Endo/Heme/Allergies: Negative for polydipsia.  Psychiatric/Behavioral: Negative for depression and suicidal ideas. The patient is not nervous/anxious and does not have insomnia.     Objective  BP 110/74  Pulse 89  Temp(Src) 98 F (36.7 C) (Oral)  Ht 6\' 3"  (1.905 m)  Wt 276 lb 0.6 oz (125.211 kg)  BMI 34.50 kg/m2  SpO2 97%  Physical Exam  Physical Exam  Constitutional: He is oriented to person, place, and time and well-developed, well-nourished, and in no distress. No distress.  HENT:  Head: Normocephalic and atraumatic.  Eyes: Conjunctivae are normal.  Neck: Neck supple. No thyromegaly present.  Cardiovascular: Normal rate, regular rhythm and normal heart sounds.   No murmur heard. Pulmonary/Chest: Effort normal and breath sounds normal. No respiratory distress.  Abdominal: He exhibits no distension and no mass. There is no tenderness.  Musculoskeletal: He exhibits no edema.  Neurological: He is alert and oriented to person,  place, and time. Coordination abnormal. GCS score is 15.  Skin: Skin is warm.  Psychiatric: Memory, affect and judgment normal.    Lab Results  Component Value Date   TSH 0.56 10/11/2012   Lab Results  Component Value Date   WBC 8.9 10/11/2012   HGB 15.2 10/11/2012   HCT 46.4 10/11/2012   MCV 90.4 10/11/2012   PLT 206.0 10/11/2012   Lab Results  Component Value Date   CREATININE 1.1 10/11/2012  BUN 14 10/11/2012   NA 140 10/11/2012   K 4.5 10/11/2012   CL 104 10/11/2012   CO2 31 10/11/2012   Lab Results  Component Value Date   ALT 40 10/11/2012   AST 25 10/11/2012   ALKPHOS 53 10/11/2012   BILITOT 0.7 10/11/2012   Lab Results  Component Value Date   CHOL 149 10/11/2012   Lab Results  Component Value Date   HDL 49.90 10/11/2012   Lab Results  Component Value Date   LDLCALC 88 10/11/2012   Lab Results  Component Value Date   TRIG 57.0 10/11/2012   Lab Results  Component Value Date   CHOLHDL 3 10/11/2012     Assessment & Plan  HTN (hypertension) Well controlled no changes  Coronary atherosclerosis of native coronary artery Asymptomatic, good control of risk factors  Obesity Good weight loss over past year, encouraged ongoing efforts  MS (multiple sclerosis) No new lesions since 2008 on current meds. 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 Followsclosely with neurology

## 2013-11-01 NOTE — Progress Notes (Signed)
Pre visit review using our clinic review tool, if applicable. No additional management support is needed unless otherwise documented below in the visit note. 

## 2013-11-02 ENCOUNTER — Telehealth: Payer: Self-pay | Admitting: *Deleted

## 2013-11-02 LAB — TSH: TSH: 0.834 u[IU]/mL (ref 0.350–4.500)

## 2013-11-02 LAB — TESTOSTERONE: Testosterone: 260 ng/dL — ABNORMAL LOW (ref 300–890)

## 2013-11-02 NOTE — Telephone Encounter (Signed)
Rx called to pharmacy voicemail below as electronic transmission to the pharmacy failed.   Pharmacy     [facility] - [place], [place] - [address]    [place] [address]    Phone: [phone] Fax: [phone]    Open 24 Hours?: No              Patient Demographics     Patient Name Sex DOB SSN Address Phone    Keith Morrow, Keith Morrow Male [birth year] [id number] [address] [facility] [address] [phone] (Home) [phone] (Mobile)              Medication Detail       Disp Refills Start End      olmesartan (BENICAR) 20 MG tablet 90 tablet 3 11/01/2013      Sig: TAKE 1 TABLET BY MOUTH EVERY DAY     E-Prescribing Status: Transmission to pharmacy failed (11/01/2013 2:22 PM EST)

## 2013-11-06 NOTE — Assessment & Plan Note (Signed)
Well controlled no changes 

## 2013-11-06 NOTE — Assessment & Plan Note (Signed)
Asymptomatic, good control of risk factors

## 2013-11-06 NOTE — Assessment & Plan Note (Signed)
No new lesions since 2008 on current meds. 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 Followsclosely with neurology

## 2013-11-06 NOTE — Assessment & Plan Note (Signed)
Encouraged heart healthy diet and regular sleep. Move as tolerated. Reviewed annual labs

## 2013-11-06 NOTE — Assessment & Plan Note (Signed)
Good weight loss over past year, encouraged ongoing efforts

## 2013-11-11 ENCOUNTER — Encounter: Payer: Self-pay | Admitting: Family Medicine

## 2013-11-11 DIAGNOSIS — G35 Multiple sclerosis: Secondary | ICD-10-CM

## 2013-11-11 DIAGNOSIS — F329 Major depressive disorder, single episode, unspecified: Secondary | ICD-10-CM

## 2013-11-11 DIAGNOSIS — F419 Anxiety disorder, unspecified: Secondary | ICD-10-CM

## 2013-11-11 DIAGNOSIS — I10 Essential (primary) hypertension: Secondary | ICD-10-CM

## 2013-11-11 MED ORDER — LORAZEPAM 1 MG PO TABS: 1.0000 mg | ORAL_TABLET | Freq: Four times a day (QID) | ORAL | Status: DC | PRN

## 2013-11-11 MED ORDER — OLMESARTAN MEDOXOMIL 20 MG PO TABS: ORAL_TABLET | ORAL | Status: DC

## 2013-11-11 NOTE — Addendum Note (Signed)
Addended by: Court Joy on: 11/11/2013 05:30 PM   Modules accepted: Orders

## 2013-12-25 ENCOUNTER — Other Ambulatory Visit: Payer: Self-pay | Admitting: Family Medicine

## 2014-02-03 ENCOUNTER — Other Ambulatory Visit: Payer: Self-pay | Admitting: Cardiovascular Disease

## 2014-03-03 ENCOUNTER — Other Ambulatory Visit: Payer: Self-pay | Admitting: Family Medicine

## 2014-03-06 ENCOUNTER — Ambulatory Visit (INDEPENDENT_AMBULATORY_CARE_PROVIDER_SITE_OTHER): Payer: Managed Care, Other (non HMO) | Admitting: Family Medicine

## 2014-03-06 ENCOUNTER — Encounter: Payer: Self-pay | Admitting: Family Medicine

## 2014-03-06 ENCOUNTER — Telehealth: Payer: Self-pay | Admitting: Family Medicine

## 2014-03-06 VITALS — BP 122/74 | HR 84 | Temp 97.7°F | Ht 75.0 in | Wt 287.0 lb

## 2014-03-06 DIAGNOSIS — B351 Tinea unguium: Secondary | ICD-10-CM

## 2014-03-06 DIAGNOSIS — B353 Tinea pedis: Secondary | ICD-10-CM

## 2014-03-06 DIAGNOSIS — I10 Essential (primary) hypertension: Secondary | ICD-10-CM

## 2014-03-06 DIAGNOSIS — L259 Unspecified contact dermatitis, unspecified cause: Secondary | ICD-10-CM

## 2014-03-06 DIAGNOSIS — L309 Dermatitis, unspecified: Secondary | ICD-10-CM | POA: Insufficient documentation

## 2014-03-06 MED ORDER — EFINACONAZOLE 10 % EX SOLN: 1.0000 "application " | Freq: Every day | CUTANEOUS | Status: DC

## 2014-03-06 MED ORDER — CLOTRIMAZOLE-BETAMETHASONE 1-0.05 % EX CREA: TOPICAL_CREAM | CUTANEOUS | Status: DC

## 2014-03-06 NOTE — Progress Notes (Signed)
Patient ID: Keith Morrow, male   DOB: Apr 14, 1971, 43 y.o.   MRN: [id number] Keith Morrow [id number] [birth year] 03/06/2014      Progress Note-Follow Up  Subjective  Chief Complaint  Chief Complaint  Patient presents with  . look at toenail    HPI  Patient is a 43 year old male in today for routine medical care. He is generally feeling well but is struggling with his feet. Has multiple complaints. First his left great, and was loose after he tried to excise some of his cuticle around an inflamed toenail bed on the left. It catches on his socks. Not painful, red or swollen. He also complains of pain in his right great toe. He notes when he puts pressure on the ball of his foot his right toe can hurt at times this is tolerable but worsening. He also has recurrent tinea pedis and that has flared lately he is requesting a refill of his Lotrisone. Finally he complains of a rash on his right calf but his AFO hits his leg. Denies CP/palp/SOB/HA/congestion/fevers/GI or GU c/o. Taking meds as prescribed  Past Medical History  Diagnosis Date  . HTN (hypertension)   . Hypothyroidism   . Hyperlipidemia   . MS (multiple sclerosis)   . Coronary atherosclerosis of native coronary artery     a. NSTEMI 04/20/11: tx with Promus DES to LAD; bifurcation lesion with oDx 90% tx with POBA; b. staged PCI of right post. AV Branch with Promus DES; c. residual at cath 04/21/11:  AV groove CFX 50%, mRCA 30%,; EF 55%  . Chicken pox as a child  . Heart attack 04-20-11  . Allergy   . Obesity   . Depression   . Onychomycosis   . Tinea pedis   . Keloid scar   . Preventative health care 10/07/2011  . Reflux 10/07/2011  . 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 Hypotestosteronism 11/17/2011  . Dermatitis, contact 12/10/2011  . Bronchitis, acute 12/10/2011  . Testosterone deficiency 01/16/2012  . Fatigue 10/15/2012  . Acute bronchitis 05/09/2013    Past Surgical History  Procedure Laterality Date  . Right wrist  surgery      CTR  . Vasectomy    . Coronary angioplasty with stent placement    . Vascular surgery      vericose vein in right femoral area  . Wisdom tooth extraction      Family History  Problem Relation Age of Onset  . Coronary artery disease      questionable in father  . Heart attack Mother 64  . Thyroid disease Mother   . Heart disease Mother   . Thyroid disease Sister   . Thyroid disease Brother   . Heart disease Maternal Grandmother   . Hypertension Maternal Grandmother   . Hyperlipidemia Maternal Grandmother   . Heart disease Maternal Grandfather   . Hypertension Maternal Grandfather   . Hyperlipidemia Maternal Grandfather   . Stroke Paternal Grandfather   . Thyroid disease Sister   . Heart disease Father     2 stents    History   Social History  . Marital Status: Married    Spouse Name: N/A    Number of Children: 3  . Years of Education: N/A   Occupational History  . Not on file.   Social History Main Topics  . Smoking status: Never Smoker   . Smokeless tobacco: Never Used  . Alcohol Use: Yes     Comment: socially  .  Drug Use: No  . Sexual Activity: Yes    Partners: Female   Other Topics Concern  . Not on file   Social History Narrative  . No narrative on file    Current Outpatient Prescriptions on File Prior to Visit  Medication Sig Dispense Refill  . aspirin 81 MG tablet Take 81 mg by mouth daily.        Marland Kitchen. atorvastatin (LIPITOR) 40 MG tablet TAKE 1 TABLET (40 MG TOTAL) BY MOUTH DAILY.  30 tablet  5  . baclofen (LIORESAL) 10 MG tablet Take 20 mg by mouth 4 (four) times daily.       . cetirizine (ZYRTEC) 10 MG tablet Take 10 mg by mouth as needed.        . chlorpheniramine-HYDROcodone (TUSSIONEX PENNKINETIC ER) 10-8 MG/5ML LQCR Take 5 mLs by mouth every 12 (twelve) hours as needed.  200 mL  1  . Dalfampridine (AMPYRA) 10 MG TB12 Take by mouth. Every 12 hours       . DULoxetine (CYMBALTA) 60 MG capsule Take 1 capsule (60 mg total) by mouth 2  (two) times daily.  180 capsule  3  . LORazepam (ATIVAN) 1 MG tablet Take 1 tablet (1 mg total) by mouth every 6 (six) hours as needed for anxiety or sleep.  90 tablet  1  . nitroGLYCERIN (NITROSTAT) 0.4 MG SL tablet Place 1 tablet (0.4 mg total) under the tongue every 5 (five) minutes as needed for chest pain.  25 tablet  3  . olmesartan (BENICAR) 20 MG tablet TAKE 1 TABLET BY MOUTH EVERY DAY  90 tablet  3  . SYNTHROID 150 MCG tablet TAKE 1 TABLET (150 MCG TOTAL) BY MOUTH DAILY.  30 tablet  4  . testosterone cypionate (DEPOTESTOTERONE CYPIONATE) 200 MG/ML injection Inject 200 mg into the muscle every 28 (twenty-eight) days. Every week for 3 weeks than skip a week      . vardenafil (LEVITRA) 10 MG tablet Take 1 tablet (10 mg total) by mouth daily as needed for erectile dysfunction.  10 tablet  1  . methylphenidate (RITALIN) 10 MG tablet Take 10 mg by mouth 2 (two) times daily as needed.       No current facility-administered medications on file prior to visit.    No Known Allergies  Review of Systems  Review of Systems  Constitutional: Negative for fever and malaise/fatigue.  HENT: Negative for congestion.   Eyes: Negative for discharge.  Respiratory: Negative for shortness of breath.   Cardiovascular: Negative for chest pain, palpitations and leg swelling.  Gastrointestinal: Negative for nausea, abdominal pain and diarrhea.  Genitourinary: Negative for dysuria.  Musculoskeletal: Positive for joint pain. Negative for falls.       Right great toe pain with flexion  Skin: Positive for rash.       Feet and right upper medial calf  Neurological: Positive for focal weakness. Negative for loss of consciousness and headaches.  Endo/Heme/Allergies: Negative for polydipsia.  Psychiatric/Behavioral: Negative for depression and suicidal ideas. The patient is not nervous/anxious and does not have insomnia.     Objective  BP 122/74  Pulse 84  Temp(Src) 97.7 F (36.5 C) (Oral)  Ht 6\' 3"   (1.905 m)  Wt 287 lb 0.6 oz (130.2 kg)  BMI 35.88 kg/m2  SpO2 96%  Physical Exam Physical Exam  Constitutional: He is oriented to person, place, and time and well-developed, well-nourished, and in no distress. No distress.  HENT:  Head: Normocephalic and atraumatic.  Eyes:  Conjunctivae are normal.  Neck: Neck supple. No thyromegaly present.  Cardiovascular: Normal rate, regular rhythm and normal heart sounds.   No murmur heard. Pulmonary/Chest: Effort normal and breath sounds normal. No respiratory distress.  Abdominal: He exhibits no distension and no mass. There is no tenderness.  Musculoskeletal: He exhibits no edema.  Fallen arches b/l feet  Neurological: He is alert and oriented to person, place, and time.  Skin: Skin is warm.  Toenails thick and yellowed, left great toenail loose. Skin on feet thick and flaky, rash on right upper medial calf, raised circular slightly scaly  Psychiatric: Memory, affect and judgment normal.    Lab Results  Component Value Date   TSH 0.834 11/01/2013   Lab Results  Component Value Date   WBC 7.7 11/01/2013   HGB 15.3 11/01/2013   HCT 44.3 11/01/2013   MCV 85.7 11/01/2013   PLT 207 11/01/2013   Lab Results  Component Value Date   CREATININE 0.96 11/01/2013   BUN 9 11/01/2013   NA 141 11/01/2013   K 4.5 11/01/2013   CL 104 11/01/2013   CO2 30 11/01/2013   Lab Results  Component Value Date   ALT 28 11/01/2013   AST 18 11/01/2013   ALKPHOS 62 11/01/2013   BILITOT 0.5 11/01/2013   Lab Results  Component Value Date   CHOL 151 11/01/2013   Lab Results  Component Value Date   HDL 60 11/01/2013   Lab Results  Component Value Date   LDLCALC 80 11/01/2013   Lab Results  Component Value Date   TRIG 54 11/01/2013   Lab Results  Component Value Date   CHOLHDL 2.5 11/01/2013     Assessment & Plan  Onychomycosis Left great toenail loose referred to podiatry and started on Jublia, already took a long course of oral  Terbenafine  Tinea pedis Given refill on Lotrisone  Dermatitis Right medial upper calf where AFO rubs. Will talk with the company that makes his AFO,possible some secondary fungal component. Try Lotrisone qhs  HTN (hypertension) Well controlled, no changes to meds. Encouraged heart healthy diet such as the DASH diet and exercise as tolerated.

## 2014-03-06 NOTE — Patient Instructions (Signed)
Ringworm, Nail A fungal infection of the nail (tinea unguium/onychomycosis) is common. It is common as the visible part of the nail is composed of dead cells which have no blood supply to help prevent infection. It occurs because fungi are everywhere and will pick any opportunity to grow on any dead material. Because nails are very slow growing they require up to 2 years of treatment with anti-fungal medications. The entire nail back to the base is infected. This includes approximately  of the nail which you cannot see. If your caregiver has prescribed a medication by mouth, take it every day and as directed. No progress will be seen for at least 6 to 9 months. Do not be disappointed! Because fungi live on dead cells with little or no exposure to blood supply, medication delivery to the infection is slow; thus the cure is slow. It is also why you can observe no progress in the first 6 months. The nail becoming cured is the base of the nail, as it has the blood supply. Topical medication such as creams and ointments are usually not effective. Important in successful treatment of nail fungus is closely following the medication regimen that your doctor prescribes. Sometimes you and your caregiver may elect to speed up this process by surgical removal of all the nails. Even this may still require 6 to 9 months of additional oral medications. See your caregiver as directed. Remember there will be no visible improvement for at least 6 months. See your caregiver sooner if other signs of infection (redness and swelling) develop. Document Released: 11/21/2000 Document Revised: 02/16/2012 Document Reviewed: 01/30/2009 Exit[employer] Patient Information 2014 [employer].  

## 2014-03-06 NOTE — Assessment & Plan Note (Signed)
Left great toenail loose referred to podiatry and started on Jublia, already took a long course of oral Terbenafine

## 2014-03-06 NOTE — Assessment & Plan Note (Signed)
Well controlled, no changes to meds. Encouraged heart healthy diet such as the DASH diet and exercise as tolerated.  °

## 2014-03-06 NOTE — Progress Notes (Signed)
Pre visit review using our clinic review tool, if applicable. No additional management support is needed unless otherwise documented below in the visit note. 

## 2014-03-06 NOTE — Assessment & Plan Note (Signed)
Right medial upper calf where AFO rubs. Will talk with the company that makes his AFO,possible some secondary fungal component. Try Lotrisone qhs

## 2014-03-06 NOTE — Telephone Encounter (Signed)
Relevant patient education assigned to patient using Emmi. ° °

## 2014-03-06 NOTE — Assessment & Plan Note (Signed)
Given refill on Lotrisone

## 2014-03-21 ENCOUNTER — Encounter: Payer: Self-pay | Admitting: Family Medicine

## 2014-03-22 ENCOUNTER — Ambulatory Visit (INDEPENDENT_AMBULATORY_CARE_PROVIDER_SITE_OTHER): Payer: Managed Care, Other (non HMO) | Admitting: Podiatrist

## 2014-03-22 ENCOUNTER — Ambulatory Visit (INDEPENDENT_AMBULATORY_CARE_PROVIDER_SITE_OTHER): Payer: Managed Care, Other (non HMO)

## 2014-03-22 ENCOUNTER — Encounter: Payer: Self-pay | Admitting: Podiatrist

## 2014-03-22 VITALS — BP 123/77 | HR 89 | Resp 17 | Ht 75.0 in | Wt 284.0 lb

## 2014-03-22 DIAGNOSIS — S92919A Unspecified fracture of unspecified toe(s), initial encounter for closed fracture: Secondary | ICD-10-CM

## 2014-03-22 DIAGNOSIS — S92911A Unspecified fracture of right toe(s), initial encounter for closed fracture: Secondary | ICD-10-CM

## 2014-03-22 DIAGNOSIS — M2030 Hallux varus (acquired), unspecified foot: Secondary | ICD-10-CM

## 2014-03-22 DIAGNOSIS — L600 Ingrowing nail: Secondary | ICD-10-CM

## 2014-03-22 NOTE — Patient Instructions (Signed)

## 2014-03-22 NOTE — Progress Notes (Signed)
   Subjective:    Patient ID: Keith Morrow, male    DOB: Mar 25, 1971, 43 y.o.   MRN: [id number]  HPI Comments: Left 1st toenail - Pt states began 1 month ago with pain, drainage, treated with a little home surgery and alcohol pads. Right 1st medial toe - Pt states 3 - 4 month of sharp pain when walking especially barefoot.  Toe Pain       Review of Systems  All other systems reviewed and are negative.      Objective:   Physical Exam Vascular status intact with palpable pulses at 2/4 dp and pt bilateral.  Neuropathy is present right sided moreso than left due to MS.  SWMF intact at 5/5 sites left 3/5 sites right.  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  Musculoskeletal exam significant for contracture deformity of all digits bilateral.  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  Pain at the hallux ip joint plantarly is present at times.  No abnormalities present clinically or on xray.  Dermatological exam reveals fungal and loose left hallux nail especially medial border.  It appears to have broken off at the base and stayed intact distally.  No redness or swelling, no infection is present.        Assessment & Plan:  Ingrown left hallux nail- medial and lateral borders:  Hallux malleus right  Plan:  Treatment options and alternatives discussed.  Recommended permanent phenol matrixectomy and patient agreed.  Left great toe was prepped with alcohol and a 1 to 1 mix of 0.5% marcaine plain and 2% lidocaine plain was administered in a digital block fashion.  The toe was then prepped with betadine solution and exsanguinated.  The offending nail border was then excised and matrix tissue exposed.  Phenol was then applied to the matrix tissue followed by an alcohol wash.  Antibiotic ointment and a dry sterile dressing was applied.  The patient was dispensed instructions for aftercare.  Discussed xray and contracture of the toe-- no abnormailties were  noted and he will continue to watch the toe.

## 2014-06-05 ENCOUNTER — Ambulatory Visit: Payer: Managed Care, Other (non HMO) | Admitting: Family Medicine

## 2014-07-07 ENCOUNTER — Encounter: Payer: Self-pay | Admitting: Family Medicine

## 2014-07-07 ENCOUNTER — Other Ambulatory Visit: Payer: Self-pay | Admitting: Family Medicine

## 2014-07-07 DIAGNOSIS — E039 Hypothyroidism, unspecified: Secondary | ICD-10-CM

## 2014-07-07 MED ORDER — SYNTHROID 150 MCG PO TABS: 150.0000 ug | ORAL_TABLET | Freq: Every day | ORAL | Status: DC

## 2014-07-07 NOTE — Telephone Encounter (Signed)
Pt's TSH checked 10/2013. Pt last seen 02/2014 and has not future appts on file. Please advise if ok to refill synthroid and when should pt follow up again?

## 2014-07-27 ENCOUNTER — Other Ambulatory Visit: Payer: Self-pay | Admitting: Cardiovascular Disease

## 2014-07-31 ENCOUNTER — Other Ambulatory Visit: Payer: Self-pay | Admitting: Neurosurgery

## 2014-08-11 ENCOUNTER — Encounter (HOSPITAL_COMMUNITY): Payer: Self-pay | Admitting: Pharmacy Technician

## 2014-08-11 NOTE — Pre-Procedure Instructions (Signed)
Keith Morrow  08/11/2014   Your procedure is scheduled on:  Thursday, September 10th  Report to [facility] Admitting at [facility].  Call this number if you have problems the morning of surgery: [phone]   Remember:   Do not eat food or drink liquids after midnight.   Take these medicines the morning of surgery with A SIP OF WATER: baclofen, cymbalta, ativan if needed, synthroid, ampyra     (STOP ASPIRIN )   Do not wear jewelry.  Do not wear lotions, powders, or perfumes. You may wear deodorant.  Do not shave 48 hours prior to surgery. Men may shave face and neck.  Do not bring valuables to the hospital.  [facility] is not responsible  for any belongings or valuables.               Contacts, dentures or bridgework may not be worn into surgery.  Leave suitcase in the car. After surgery it may be brought to your room.  For patients admitted to the hospital, discharge time is determined by your treatment team.               Patients discharged the day of surgery will not be allowed to drive home.  Please read over the following fact sheets that you were given: Pain Booklet, Coughing and Deep Breathing, MRSA Information and Surgical Site Infection Prevention [facility] - Preparing for Surgery  Before surgery, you can play an important role.  Because skin is not sterile, your skin needs to be as free of germs as possible.  You can reduce the number of germs on you skin by washing with CHG (chlorahexidine gluconate) soap before surgery.  CHG is an antiseptic cleaner which kills germs and bonds with the skin to continue killing germs even after washing.  Please DO NOT use if you have an allergy to CHG or antibacterial soaps.  If your skin becomes reddened/irritated stop using the CHG and inform your nurse when you arrive at Short Stay.  Do not shave (including legs and underarms) for at least 48 hours prior to the first CHG shower.  You may shave your face.  Please follow these  instructions carefully:   1.  Shower with CHG Soap the night before surgery and the morning of Surgery.  2.  If you choose to wash your hair, wash your hair first as usual with your normal shampoo.  3.  After you shampoo, rinse your hair and body thoroughly to remove the shampoo.  4.  Use CHG as you would any other liquid soap.  You can apply CHG directly to the skin and wash gently with scrungie or a clean washcloth.  5.  Apply the CHG Soap to your body ONLY FROM THE NECK DOWN.  Do not use on open wounds or open sores.  Avoid contact with your eyes, ears, mouth and genitals (private parts).  Wash genitals (private parts) with your normal soap.  6.  Wash thoroughly, paying special attention to the area where your surgery will be performed.  7.  Thoroughly rinse your body with warm water from the neck down.  8.  DO NOT shower/wash with your normal soap after using and rinsing off the CHG Soap.  9.  Pat yourself dry with a clean towel.            10.  Wear clean pajamas.            11.  Place clean sheets  on your bed the night of your first shower and do not sleep with pets.  Day of Surgery  Do not apply any lotions/deoderants the morning of surgery.  Please wear clean clothes to the hospital/surgery center.

## 2014-08-15 ENCOUNTER — Encounter (HOSPITAL_COMMUNITY)
Admission: RE | Admit: 2014-08-15 | Discharge: 2014-08-15 | Disposition: A | Discharge status: Home / Self Care | Payer: Managed Care, Other (non HMO) | Source: Ambulatory Visit | Attending: Neurosurgery | Admitting: Neurosurgery

## 2014-08-15 ENCOUNTER — Encounter (HOSPITAL_COMMUNITY): Payer: Self-pay

## 2014-08-15 DIAGNOSIS — R269 Unspecified abnormalities of gait and mobility: Secondary | ICD-10-CM | POA: Insufficient documentation

## 2014-08-15 DIAGNOSIS — Z01818 Encounter for other preprocedural examination: Secondary | ICD-10-CM | POA: Diagnosis present

## 2014-08-15 DIAGNOSIS — G35 Multiple sclerosis: Secondary | ICD-10-CM | POA: Diagnosis present

## 2014-08-15 HISTORY — DX: Myoneural disorder, unspecified: G70.9

## 2014-08-15 LAB — BASIC METABOLIC PANEL
Anion gap: 10 (ref 5–15)
BUN: 14 mg/dL (ref 6–23)
CO2: 27 mEq/L (ref 19–32)
Calcium: 9.2 mg/dL (ref 8.4–10.5)
Chloride: 103 mEq/L (ref 96–112)
Creatinine, Ser: 0.89 mg/dL (ref 0.50–1.35)
GFR calc Af Amer: >90 mL/min (ref >90)
GFR calc non Af Amer: >90 mL/min (ref >90)
Glucose, Bld: 91 mg/dL (ref 70–99)
Potassium: 4.4 mEq/L (ref 3.7–5.3)
Sodium: 140 mEq/L (ref 137–147)

## 2014-08-15 LAB — CBC
HCT: 42.2 % (ref 39.0–52.0)
Hemoglobin: 14.4 g/dL (ref 13.0–17.0)
MCH: 30 pg (ref 26.0–34.0)
MCHC: 34.1 g/dL (ref 30.0–36.0)
MCV: 87.9 fL (ref 78.0–100.0)
Platelets: 200 10*3/uL (ref 150–400)
RBC: 4.8 MIL/uL (ref 4.22–5.81)
RDW: 14.1 % (ref 11.5–15.5)
WBC: 8.2 10*3/uL (ref 4.0–10.5)

## 2014-08-15 LAB — SURGICAL PCR SCREEN
MRSA, PCR: NEGATIVE
Staphylococcus aureus: NEGATIVE

## 2014-08-15 NOTE — Progress Notes (Signed)
08/15/14 4098  OBSTRUCTIVE SLEEP APNEA  Have you ever been diagnosed with sleep apnea through a sleep study? No  Do you snore loudly (loud enough to be heard through closed doors)?  0  Do you often feel tired, fatigued, or sleepy during the daytime? 0  Has anyone observed you stop breathing during your sleep? 0  Do you have, or are you being treated for high blood pressure? 1  BMI more than 35 kg/m2? 1  Age over 41 years old? 0  Neck circumference greater than 40 cm/16 inches? 1  Gender: 1  Obstructive Sleep Apnea Score 4  Score 4 or greater  Results sent to PCP

## 2014-08-18 ENCOUNTER — Ambulatory Visit: Payer: Managed Care, Other (non HMO) | Admitting: Nurse Practitioner

## 2014-08-28 MED ORDER — DEXTROSE 5 % IV SOLN
3.0000 g | INTRAVENOUS | Status: AC
  Administered 2014-08-29: 3 g via INTRAVENOUS
  Filled 2014-08-28: qty 3000

## 2014-08-29 ENCOUNTER — Ambulatory Visit (INDEPENDENT_AMBULATORY_CARE_PROVIDER_SITE_OTHER): Payer: Managed Care, Other (non HMO) | Admitting: Nurse Practitioner

## 2014-08-29 ENCOUNTER — Encounter (HOSPITAL_COMMUNITY): Payer: Managed Care, Other (non HMO) | Admitting: Anesthesiology

## 2014-08-29 ENCOUNTER — Observation Stay (HOSPITAL_COMMUNITY)
Admission: RE | Admit: 2014-08-29 | Discharge: 2014-08-30 | Disposition: A | Discharge status: Home / Self Care | Payer: Managed Care, Other (non HMO) | Source: Ambulatory Visit | Attending: Neurosurgery | Admitting: Neurosurgery

## 2014-08-29 ENCOUNTER — Encounter (HOSPITAL_COMMUNITY): Payer: Self-pay | Admitting: Anesthesiology

## 2014-08-29 ENCOUNTER — Encounter: Payer: Self-pay | Admitting: Nurse Practitioner

## 2014-08-29 ENCOUNTER — Ambulatory Visit (HOSPITAL_COMMUNITY): Payer: Managed Care, Other (non HMO) | Admitting: Anesthesiology

## 2014-08-29 ENCOUNTER — Encounter (HOSPITAL_COMMUNITY)
Admission: RE | Disposition: A | Discharge status: Home / Self Care | Payer: Self-pay | Source: Ambulatory Visit | Attending: Neurosurgery

## 2014-08-29 ENCOUNTER — Ambulatory Visit (HOSPITAL_COMMUNITY): Payer: Managed Care, Other (non HMO)

## 2014-08-29 VITALS — BP 120/80 | HR 81 | Ht 75.0 in | Wt 282.0 lb

## 2014-08-29 DIAGNOSIS — I10 Essential (primary) hypertension: Secondary | ICD-10-CM | POA: Insufficient documentation

## 2014-08-29 DIAGNOSIS — G35 Multiple sclerosis: Principal | ICD-10-CM | POA: Diagnosis present

## 2014-08-29 DIAGNOSIS — F329 Major depressive disorder, single episode, unspecified: Secondary | ICD-10-CM | POA: Diagnosis not present

## 2014-08-29 DIAGNOSIS — Z8249 Family history of ischemic heart disease and other diseases of the circulatory system: Secondary | ICD-10-CM | POA: Diagnosis not present

## 2014-08-29 DIAGNOSIS — Z79899 Other long term (current) drug therapy: Secondary | ICD-10-CM | POA: Diagnosis not present

## 2014-08-29 DIAGNOSIS — I739 Peripheral vascular disease, unspecified: Secondary | ICD-10-CM | POA: Insufficient documentation

## 2014-08-29 DIAGNOSIS — I252 Old myocardial infarction: Secondary | ICD-10-CM | POA: Insufficient documentation

## 2014-08-29 DIAGNOSIS — E039 Hypothyroidism, unspecified: Secondary | ICD-10-CM | POA: Diagnosis not present

## 2014-08-29 DIAGNOSIS — I2510 Atherosclerotic heart disease of native coronary artery without angina pectoris: Secondary | ICD-10-CM

## 2014-08-29 DIAGNOSIS — Z7982 Long term (current) use of aspirin: Secondary | ICD-10-CM | POA: Diagnosis not present

## 2014-08-29 DIAGNOSIS — Z9861 Coronary angioplasty status: Secondary | ICD-10-CM | POA: Insufficient documentation

## 2014-08-29 DIAGNOSIS — F3289 Other specified depressive episodes: Secondary | ICD-10-CM | POA: Insufficient documentation

## 2014-08-29 DIAGNOSIS — Z6835 Body mass index (BMI) 35.0-35.9, adult: Secondary | ICD-10-CM | POA: Insufficient documentation

## 2014-08-29 DIAGNOSIS — E669 Obesity, unspecified: Secondary | ICD-10-CM | POA: Diagnosis not present

## 2014-08-29 DIAGNOSIS — E7800 Pure hypercholesterolemia, unspecified: Secondary | ICD-10-CM | POA: Insufficient documentation

## 2014-08-29 DIAGNOSIS — E785 Hyperlipidemia, unspecified: Secondary | ICD-10-CM

## 2014-08-29 HISTORY — PX: PAIN PUMP IMPLANTATION: SHX330

## 2014-08-29 HISTORY — PX: PROGRAMABLE BACLOFEN PUMP REVISION: SHX2268

## 2014-08-29 LAB — HEPATIC FUNCTION PANEL
ALT: 26 U/L (ref 0–53)
AST: 15 U/L (ref 0–37)
Albumin: 4.5 g/dL (ref 3.5–5.2)
Alkaline Phosphatase: 66 U/L (ref 39–117)
Bilirubin, Direct: 0 mg/dL (ref 0.0–0.3)
Total Bilirubin: 0.6 mg/dL (ref 0.2–1.2)
Total Protein: 7.6 g/dL (ref 6.0–8.3)

## 2014-08-29 LAB — LIPID PANEL
Cholesterol: 148 mg/dL (ref 0–200)
HDL: 57.8 mg/dL (ref >39.00)
LDL Cholesterol: 73 mg/dL (ref 0–99)
NonHDL: 90.2
Total CHOL/HDL Ratio: 3
Triglycerides: 84 mg/dL (ref 0.0–149.0)
VLDL: 16.8 mg/dL (ref 0.0–40.0)

## 2014-08-29 SURGERY — PAIN PUMP INSERTION
Anesthesia: General

## 2014-08-29 MED ORDER — 0.9 % SODIUM CHLORIDE (POUR BTL) OPTIME
TOPICAL | Status: DC | PRN
  Administered 2014-08-29: 1000 mL

## 2014-08-29 MED ORDER — BISACODYL 10 MG RE SUPP
10.0000 mg | Freq: Every day | RECTAL | Status: DC | PRN
Start: 2014-08-29 — End: 2014-08-30

## 2014-08-29 MED ORDER — KCL IN DEXTROSE-NACL 20-5-0.45 MEQ/L-%-% IV SOLN
INTRAVENOUS | Status: DC
  Administered 2014-08-29: via INTRAVENOUS
  Filled 2014-08-29: qty 1000

## 2014-08-29 MED ORDER — LACTATED RINGERS IV SOLN
INTRAVENOUS | Status: DC
  Administered 2014-08-29: 50 mL/h via INTRAVENOUS

## 2014-08-29 MED ORDER — PROPOFOL 10 MG/ML IV BOLUS
INTRAVENOUS | Status: AC
  Filled 2014-08-29: qty 20

## 2014-08-29 MED ORDER — TESTOSTERONE CYPIONATE 200 MG/ML IM SOLN: 200.0000 mg | INTRAMUSCULAR | Status: DC

## 2014-08-29 MED ORDER — ROCURONIUM BROMIDE 100 MG/10ML IV SOLN
INTRAVENOUS | Status: DC | PRN
  Administered 2014-08-29: 50 mg via INTRAVENOUS
  Administered 2014-08-29: 10 mg via INTRAVENOUS

## 2014-08-29 MED ORDER — MENTHOL 3 MG MT LOZG
1.0000 | LOZENGE | OROMUCOSAL | Status: DC | PRN
Start: 2014-08-29 — End: 2014-08-30

## 2014-08-29 MED ORDER — ASPIRIN EC 81 MG PO TBEC
81.0000 mg | DELAYED_RELEASE_TABLET | Freq: Every day | ORAL | Status: DC
  Administered 2014-08-30: 81 mg via ORAL
  Filled 2014-08-29: qty 1

## 2014-08-29 MED ORDER — METHYLPHENIDATE HCL 5 MG PO TABS: 10.0000 mg | ORAL_TABLET | Freq: Two times a day (BID) | ORAL | Status: DC | PRN

## 2014-08-29 MED ORDER — MORPHINE SULFATE 2 MG/ML IJ SOLN: 1.0000 mg | INTRAMUSCULAR | Status: DC | PRN

## 2014-08-29 MED ORDER — GLYCOPYRROLATE 0.2 MG/ML IJ SOLN
INTRAMUSCULAR | Status: DC | PRN
  Administered 2014-08-29: 0.6 mg via INTRAVENOUS

## 2014-08-29 MED ORDER — ONDANSETRON HCL 4 MG/2ML IJ SOLN: 4.0000 mg | Freq: Four times a day (QID) | INTRAMUSCULAR | Status: DC | PRN

## 2014-08-29 MED ORDER — HYDROCODONE-ACETAMINOPHEN 5-325 MG PO TABS
1.0000 | ORAL_TABLET | ORAL | Status: DC | PRN
  Administered 2014-08-29 – 2014-08-30 (×2): 2 via ORAL
  Filled 2014-08-29 (×2): qty 2

## 2014-08-29 MED ORDER — LIDOCAINE-EPINEPHRINE 1 %-1:100000 IJ SOLN
INTRAMUSCULAR | Status: DC | PRN
  Administered 2014-08-29: 5 mL

## 2014-08-29 MED ORDER — DIAZEPAM 5 MG PO TABS
ORAL_TABLET | ORAL | Status: AC
  Filled 2014-08-29: qty 1

## 2014-08-29 MED ORDER — FLEET ENEMA 7-19 GM/118ML RE ENEM: 1.0000 | ENEMA | Freq: Once | RECTAL | Status: AC | PRN

## 2014-08-29 MED ORDER — ONDANSETRON HCL 4 MG/2ML IJ SOLN: 4.0000 mg | INTRAMUSCULAR | Status: DC | PRN

## 2014-08-29 MED ORDER — OXYCODONE HCL 5 MG/5ML PO SOLN: 5.0000 mg | Freq: Once | ORAL | Status: DC | PRN

## 2014-08-29 MED ORDER — MIDAZOLAM HCL 5 MG/5ML IJ SOLN
INTRAMUSCULAR | Status: DC | PRN
  Administered 2014-08-29: 2 mg via INTRAVENOUS

## 2014-08-29 MED ORDER — METHYLPHENIDATE HCL 10 MG PO TABS: 10.0000 mg | ORAL_TABLET | Freq: Two times a day (BID) | ORAL | Status: DC | PRN

## 2014-08-29 MED ORDER — OXYCODONE HCL 5 MG PO TABS: 5.0000 mg | ORAL_TABLET | Freq: Once | ORAL | Status: DC | PRN

## 2014-08-29 MED ORDER — SUCCINYLCHOLINE CHLORIDE 20 MG/ML IJ SOLN
INTRAMUSCULAR | Status: AC
  Filled 2014-08-29: qty 1

## 2014-08-29 MED ORDER — PROPOFOL 10 MG/ML IV BOLUS
INTRAVENOUS | Status: DC | PRN
  Administered 2014-08-29: 40 mg via INTRAVENOUS
  Administered 2014-08-29: 30 mg via INTRAVENOUS
  Administered 2014-08-29: 200 mg via INTRAVENOUS

## 2014-08-29 MED ORDER — SENNOSIDES-DOCUSATE SODIUM 8.6-50 MG PO TABS: 1.0000 | ORAL_TABLET | Freq: Every evening | ORAL | Status: DC | PRN

## 2014-08-29 MED ORDER — ONDANSETRON HCL 4 MG/2ML IJ SOLN
INTRAMUSCULAR | Status: AC
  Filled 2014-08-29: qty 2

## 2014-08-29 MED ORDER — FENTANYL CITRATE 0.05 MG/ML IJ SOLN
INTRAMUSCULAR | Status: AC
  Filled 2014-08-29: qty 5

## 2014-08-29 MED ORDER — SODIUM CHLORIDE 0.9 % IJ SOLN: 3.0000 mL | INTRAMUSCULAR | Status: DC | PRN

## 2014-08-29 MED ORDER — ARTIFICIAL TEARS OP OINT
TOPICAL_OINTMENT | OPHTHALMIC | Status: DC | PRN
  Administered 2014-08-29: 1 via OPHTHALMIC

## 2014-08-29 MED ORDER — CEFAZOLIN SODIUM 1-5 GM-% IV SOLN
1.0000 g | Freq: Three times a day (TID) | INTRAVENOUS | Status: AC
  Administered 2014-08-30 (×2): 1 g via INTRAVENOUS
  Filled 2014-08-29 (×2): qty 50

## 2014-08-29 MED ORDER — HYDROMORPHONE HCL 1 MG/ML IJ SOLN
0.2500 mg | INTRAMUSCULAR | Status: DC | PRN
  Administered 2014-08-29: 1 mg via INTRAVENOUS

## 2014-08-29 MED ORDER — THROMBIN 5000 UNITS EX SOLR
CUTANEOUS | Status: DC | PRN
  Administered 2014-08-29 (×2): 5000 [IU] via TOPICAL

## 2014-08-29 MED ORDER — LEVOTHYROXINE SODIUM 50 MCG PO TABS
150.0000 ug | ORAL_TABLET | Freq: Every day | ORAL | Status: DC
  Administered 2014-08-30: 150 ug via ORAL
  Filled 2014-08-29 (×2): qty 1

## 2014-08-29 MED ORDER — BACLOFEN 10 MG PO TABS
20.0000 mg | ORAL_TABLET | Freq: Four times a day (QID) | ORAL | Status: DC | PRN
  Administered 2014-08-29 – 2014-08-30 (×3): 20 mg via ORAL
  Filled 2014-08-29 (×2): qty 2
  Filled 2014-08-29: qty 1
  Filled 2014-08-29: qty 2

## 2014-08-29 MED ORDER — MIDAZOLAM HCL 2 MG/2ML IJ SOLN
INTRAMUSCULAR | Status: AC
  Filled 2014-08-29: qty 2

## 2014-08-29 MED ORDER — NEOSTIGMINE METHYLSULFATE 10 MG/10ML IV SOLN
INTRAVENOUS | Status: AC
  Filled 2014-08-29: qty 1

## 2014-08-29 MED ORDER — PROPOFOL 10 MG/ML IV BOLUS
INTRAVENOUS | Status: AC
Start: 2014-08-29 — End: 2014-08-29
  Filled 2014-08-29: qty 20

## 2014-08-29 MED ORDER — ROCURONIUM BROMIDE 50 MG/5ML IV SOLN
INTRAVENOUS | Status: AC
  Filled 2014-08-29: qty 1

## 2014-08-29 MED ORDER — DOCUSATE SODIUM 100 MG PO CAPS
100.0000 mg | ORAL_CAPSULE | Freq: Two times a day (BID) | ORAL | Status: DC
  Administered 2014-08-29 – 2014-08-30 (×2): 100 mg via ORAL
  Filled 2014-08-29 (×2): qty 1

## 2014-08-29 MED ORDER — NITROGLYCERIN 0.4 MG SL SUBL: 0.4000 mg | SUBLINGUAL_TABLET | SUBLINGUAL | Status: DC | PRN

## 2014-08-29 MED ORDER — HEMOSTATIC AGENTS (NO CHARGE) OPTIME
TOPICAL | Status: DC | PRN
  Administered 2014-08-29: 1 via TOPICAL

## 2014-08-29 MED ORDER — ATORVASTATIN CALCIUM 40 MG PO TABS
40.0000 mg | ORAL_TABLET | Freq: Every day | ORAL | Status: DC
  Administered 2014-08-30: 40 mg via ORAL
  Filled 2014-08-29: qty 1

## 2014-08-29 MED ORDER — SODIUM CHLORIDE 0.9 % IV SOLN
250.0000 mL | INTRAVENOUS | Status: DC
  Administered 2014-08-29: 250 mL via INTRAVENOUS

## 2014-08-29 MED ORDER — LORAZEPAM 1 MG PO TABS: 1.0000 mg | ORAL_TABLET | Freq: Four times a day (QID) | ORAL | Status: DC | PRN

## 2014-08-29 MED ORDER — ZOLPIDEM TARTRATE 5 MG PO TABS: 5.0000 mg | ORAL_TABLET | Freq: Every evening | ORAL | Status: DC | PRN

## 2014-08-29 MED ORDER — NEOSTIGMINE METHYLSULFATE 10 MG/10ML IV SOLN
INTRAVENOUS | Status: DC | PRN
  Administered 2014-08-29: 4 mg via INTRAVENOUS

## 2014-08-29 MED ORDER — FENTANYL CITRATE 0.05 MG/ML IJ SOLN
INTRAMUSCULAR | Status: DC | PRN
  Administered 2014-08-29: 100 ug via INTRAVENOUS
  Administered 2014-08-29: 50 ug via INTRAVENOUS
  Administered 2014-08-29: 100 ug via INTRAVENOUS
  Administered 2014-08-29 (×3): 50 ug via INTRAVENOUS

## 2014-08-29 MED ORDER — SENNA 8.6 MG PO TABS
1.0000 | ORAL_TABLET | Freq: Two times a day (BID) | ORAL | Status: DC
  Administered 2014-08-30 (×2): 8.6 mg via ORAL
  Filled 2014-08-29 (×2): qty 1

## 2014-08-29 MED ORDER — DULOXETINE HCL 60 MG PO CPEP
60.0000 mg | ORAL_CAPSULE | Freq: Two times a day (BID) | ORAL | Status: DC
Start: 2014-08-29 — End: 2014-08-30
  Administered 2014-08-29 – 2014-08-30 (×2): 60 mg via ORAL
  Filled 2014-08-29 (×2): qty 1

## 2014-08-29 MED ORDER — BUPIVACAINE HCL (PF) 0.5 % IJ SOLN
INTRAMUSCULAR | Status: DC | PRN
  Administered 2014-08-29: 5 mL

## 2014-08-29 MED ORDER — LACTATED RINGERS IV SOLN
INTRAVENOUS | Status: DC | PRN
  Administered 2014-08-29 (×2): via INTRAVENOUS

## 2014-08-29 MED ORDER — HYDROMORPHONE HCL 1 MG/ML IJ SOLN
INTRAMUSCULAR | Status: AC
  Filled 2014-08-29: qty 1

## 2014-08-29 MED ORDER — IRBESARTAN 150 MG PO TABS
75.0000 mg | ORAL_TABLET | Freq: Every day | ORAL | Status: DC
  Administered 2014-08-30: 75 mg via ORAL
  Filled 2014-08-29: qty 0.5

## 2014-08-29 MED ORDER — ONDANSETRON HCL 4 MG/2ML IJ SOLN
INTRAMUSCULAR | Status: DC | PRN
  Administered 2014-08-29: 4 mg via INTRAVENOUS

## 2014-08-29 MED ORDER — GLYCOPYRROLATE 0.2 MG/ML IJ SOLN
INTRAMUSCULAR | Status: AC
  Filled 2014-08-29: qty 3

## 2014-08-29 MED ORDER — LORATADINE 10 MG PO TABS
10.0000 mg | ORAL_TABLET | Freq: Every day | ORAL | Status: DC
  Administered 2014-08-30 (×2): 10 mg via ORAL
  Filled 2014-08-29 (×2): qty 1

## 2014-08-29 MED ORDER — DIAZEPAM 5 MG PO TABS
5.0000 mg | ORAL_TABLET | Freq: Four times a day (QID) | ORAL | Status: DC | PRN
Start: 2014-08-29 — End: 2014-08-30
  Administered 2014-08-29: 5 mg via ORAL

## 2014-08-29 MED ORDER — ARTIFICIAL TEARS OP OINT
TOPICAL_OINTMENT | OPHTHALMIC | Status: AC
  Filled 2014-08-29: qty 3.5

## 2014-08-29 MED ORDER — PANTOPRAZOLE SODIUM 40 MG IV SOLR
40.0000 mg | Freq: Every day | INTRAVENOUS | Status: DC
  Administered 2014-08-29: 40 mg via INTRAVENOUS
  Filled 2014-08-29: qty 40

## 2014-08-29 MED ORDER — LIDOCAINE HCL (CARDIAC) 20 MG/ML IV SOLN
INTRAVENOUS | Status: AC
  Filled 2014-08-29: qty 5

## 2014-08-29 MED ORDER — SODIUM CHLORIDE 0.9 % IJ SOLN
3.0000 mL | Freq: Two times a day (BID) | INTRAMUSCULAR | Status: DC
  Administered 2014-08-29: 3 mL via INTRAVENOUS

## 2014-08-29 MED ORDER — LIDOCAINE HCL (CARDIAC) 20 MG/ML IV SOLN
INTRAVENOUS | Status: DC | PRN
  Administered 2014-08-29: 60 mg via INTRAVENOUS

## 2014-08-29 MED ORDER — ACETAMINOPHEN 650 MG RE SUPP: 650.0000 mg | RECTAL | Status: DC | PRN

## 2014-08-29 MED ORDER — PHENOL 1.4 % MT LIQD
1.0000 | OROMUCOSAL | Status: DC | PRN
  Filled 2014-08-29: qty 177

## 2014-08-29 MED ORDER — ACETAMINOPHEN 325 MG PO TABS: 650.0000 mg | ORAL_TABLET | ORAL | Status: DC | PRN

## 2014-08-29 MED ORDER — OXYCODONE-ACETAMINOPHEN 5-325 MG PO TABS: 1.0000 | ORAL_TABLET | ORAL | Status: DC | PRN

## 2014-08-29 MED ORDER — ATORVASTATIN CALCIUM 40 MG PO TABS: 40.0000 mg | ORAL_TABLET | Freq: Every day | ORAL | Status: DC

## 2014-08-29 MED FILL — Baclofen Intrathecal Inj 10 MG/20ML (500 MCG/ML): INTRATHECAL | Qty: 40 | Status: AC

## 2014-08-29 SURGICAL SUPPLY — 73 items
BAG DECANTER FOR FLEXI CONT (MISCELLANEOUS) ×3 IMPLANT
BENZOIN TINCTURE PRP APPL 2/3 (GAUZE/BANDAGES/DRESSINGS) IMPLANT
BLADE SURG 10 STRL SS (BLADE) ×6 IMPLANT
BLADE SURG 15 STRL LF DISP TIS (BLADE) ×1 IMPLANT
BLADE SURG 15 STRL SS (BLADE) ×2
BLADE SURG ROTATE 9660 (MISCELLANEOUS) ×3 IMPLANT
BOOT SUTURE AID YELLOW STND (SUTURE) ×3 IMPLANT
BRUSH SCRUB EZ 1% IODOPHOR (MISCELLANEOUS) ×3 IMPLANT
CANISTER SUCT 3000ML (MISCELLANEOUS) ×3 IMPLANT
CATH ASCENDA 1PIECE (Catheter) ×3 IMPLANT
CLOSURE WOUND 1/2 X4 (GAUZE/BANDAGES/DRESSINGS)
CONT SPEC 4OZ CLIKSEAL STRL BL (MISCELLANEOUS) IMPLANT
CORDS BIPOLAR (ELECTRODE) ×3 IMPLANT
COVER MAYO STAND STRL (DRAPES) ×3 IMPLANT
DERMABOND ADHESIVE PROPEN (GAUZE/BANDAGES/DRESSINGS) ×4
DERMABOND ADVANCED .7 DNX6 (GAUZE/BANDAGES/DRESSINGS) ×2 IMPLANT
DRAPE C-ARM 42X72 X-RAY (DRAPES) ×3 IMPLANT
DRAPE INCISE IOBAN 85X60 (DRAPES) ×3 IMPLANT
DRAPE LAPAROTOMY 100X72X124 (DRAPES) ×3 IMPLANT
DRAPE POUCH INSTRU U-SHP 10X18 (DRAPES) ×3 IMPLANT
DRSG OPSITE POSTOP 4X6 (GAUZE/BANDAGES/DRESSINGS) ×6 IMPLANT
DRSG TELFA 3X8 NADH (GAUZE/BANDAGES/DRESSINGS) ×3 IMPLANT
ELECT REM PT RETURN 9FT ADLT (ELECTROSURGICAL) ×3
ELECTRODE REM PT RTRN 9FT ADLT (ELECTROSURGICAL) ×1 IMPLANT
GAUZE SPONGE 4X4 12PLY STRL (GAUZE/BANDAGES/DRESSINGS) IMPLANT
GAUZE SPONGE 4X4 16PLY XRAY LF (GAUZE/BANDAGES/DRESSINGS) ×3 IMPLANT
GLOVE BIO SURGEON STRL SZ8 (GLOVE) ×3 IMPLANT
GLOVE BIOGEL PI IND STRL 8 (GLOVE) ×1 IMPLANT
GLOVE BIOGEL PI IND STRL 8.5 (GLOVE) ×1 IMPLANT
GLOVE BIOGEL PI INDICATOR 8 (GLOVE) ×2
GLOVE BIOGEL PI INDICATOR 8.5 (GLOVE) ×2
GLOVE ECLIPSE 7.5 STRL STRAW (GLOVE) ×3 IMPLANT
GLOVE EXAM NITRILE LRG STRL (GLOVE) IMPLANT
GLOVE EXAM NITRILE MD LF STRL (GLOVE) IMPLANT
GLOVE EXAM NITRILE XL STR (GLOVE) IMPLANT
GLOVE EXAM NITRILE XS STR PU (GLOVE) IMPLANT
GOWN STRL REUS W/ TWL LRG LVL3 (GOWN DISPOSABLE) IMPLANT
GOWN STRL REUS W/ TWL XL LVL3 (GOWN DISPOSABLE) IMPLANT
GOWN STRL REUS W/TWL 2XL LVL3 (GOWN DISPOSABLE) IMPLANT
GOWN STRL REUS W/TWL LRG LVL3 (GOWN DISPOSABLE)
GOWN STRL REUS W/TWL XL LVL3 (GOWN DISPOSABLE)
KIT BASIN OR (CUSTOM PROCEDURE TRAY) ×3 IMPLANT
KIT ROOM TURNOVER OR (KITS) ×3 IMPLANT
NEEDLE HYPO 18GX1.5 BLUNT FILL (NEEDLE) IMPLANT
NEEDLE HYPO 25X1 1.5 SAFETY (NEEDLE) ×3 IMPLANT
NS IRRIG 1000ML POUR BTL (IV SOLUTION) ×3 IMPLANT
PACK EENT II TURBAN DRAPE (CUSTOM PROCEDURE TRAY) ×3 IMPLANT
PAD ABD 8X10 STRL (GAUZE/BANDAGES/DRESSINGS) IMPLANT
PASSER CATH 38CM DISP (CATHETERS) ×3 IMPLANT
PATTIES SURGICAL .5 X.5 (GAUZE/BANDAGES/DRESSINGS) IMPLANT
PATTIES SURGICAL 1X1 (DISPOSABLE) IMPLANT
PENCIL BUTTON HOLSTER BLD 10FT (ELECTRODE) ×3 IMPLANT
SPONGE LAP 4X18 X RAY DECT (DISPOSABLE) ×3 IMPLANT
SPONGE SURGIFOAM ABS GEL SZ50 (HEMOSTASIS) IMPLANT
STAPLER SKIN PROX WIDE 3.9 (STAPLE) ×3 IMPLANT
STRIP CLOSURE SKIN 1/2X4 (GAUZE/BANDAGES/DRESSINGS) IMPLANT
SUT BONE WAX W31G (SUTURE) IMPLANT
SUT SILK 0 TIES 10X30 (SUTURE) ×3 IMPLANT
SUT SILK 2 0 FS (SUTURE) ×18 IMPLANT
SUT SILK 2 0 TIES 10X30 (SUTURE) IMPLANT
SUT VIC AB 0 CT1 18XCR BRD8 (SUTURE) ×2 IMPLANT
SUT VIC AB 0 CT1 8-18 (SUTURE) ×4
SUT VIC AB 2-0 CP2 18 (SUTURE) ×6 IMPLANT
SUT VIC AB 3-0 SH 8-18 (SUTURE) ×9 IMPLANT
SYR 20ML ECCENTRIC (SYRINGE) ×3 IMPLANT
SYR 3ML LL SCALE MARK (SYRINGE) IMPLANT
SYR CONTROL 10ML LL (SYRINGE) ×9 IMPLANT
Synchromed !! Programmable Pump (Pump) ×3 IMPLANT
TOWEL OR 17X24 6PK STRL BLUE (TOWEL DISPOSABLE) ×3 IMPLANT
TOWEL OR 17X26 10 PK STRL BLUE (TOWEL DISPOSABLE) ×3 IMPLANT
TUBE CONNECTING 12'X1/4 (SUCTIONS) ×1
TUBE CONNECTING 12X1/4 (SUCTIONS) ×2 IMPLANT
WATER STERILE IRR 1000ML POUR (IV SOLUTION) ×3 IMPLANT

## 2014-08-29 NOTE — Progress Notes (Signed)
Patient ID: Keith Morrow, male   DOB: [birth year], 43 y.o.   MRN: [id number] baclofen pump placement: 87ml pump, 553mcg/ml, 126mcg/day  Pt awakens to voice. Responds & converses appropriately. Moving upper extremities without tremor/spasticity. Both legs tight, unable to flex knees yet. Denies pain, noting some lumbar discomfort with movement, some right abdominal discomfort with gentle palpation. Intrathecal Baclofen catheter purge likely not yet complete (set post-op for ).  Pt aware of plan for PO Baclofen PRN as Intrathecal Baclofen is titrated for ideal dose over upcoming weeks.   Georgiann Cocker RN BSN

## 2014-08-29 NOTE — Progress Notes (Signed)
Keith Morrow Date of Birth: 10/05/1971 Medical Record #[id number]  History of Present Illness: Keith Morrow is seen back today for a one year check - seen for Dr. Excell Seltzer. He is a 43 year old male with premature CAD - has had NSTEMI in 2012 treated with stenting of the LAD and PTCA of the DX folllowed by staged PCI with stenting of the posterior AV sedment of the RCA at that time. Last Myoview in 2013.   Other issues include hypothyroidism, HLD, and HTN. He has MS as well.   Last seen here a year ago and was doing ok. Beta blocker was stopped due to issues with fatigue and ED.   Comes back today. Here alone. For "baclofen pump placement" per Dr. Venetia Maxon later today. Has chronic spasms in his legs from his MS which causes his legs to "lock up". Previously was able to walk about 5000 steps per day = now down to about 2 to 3000. EKG from earlier this month reviewed - it was ok. He has had no chest pain. Not short of breath. Pretty limited by his MS in his ability to exercise. Feels ok on his medicines. Not dizzy. BP good.    Current Outpatient Prescriptions  Medication Sig Dispense Refill  . atorvastatin (LIPITOR) 40 MG tablet Take 1 tablet (40 mg total) by mouth daily.  90 tablet  3  . baclofen (LIORESAL) 20 MG tablet Take 20 mg by mouth 4 (four) times daily.      . cetirizine (ZYRTEC) 10 MG tablet Take 10 mg by mouth daily as needed for allergies.       . Dalfampridine (AMPYRA) 10 MG TB12 Take 10 mg by mouth 2 (two) times daily. Every 12 hours      . DULoxetine (CYMBALTA) 60 MG capsule Take 60 mg by mouth 2 (two) times daily.      Marland Kitchen LEVITRA 10 MG tablet Take 10 mg by mouth as needed.       Marland Kitchen levothyroxine (SYNTHROID, LEVOTHROID) 150 MCG tablet Take 150 mcg by mouth daily before breakfast.      . LORazepam (ATIVAN) 1 MG tablet Take 1 mg by mouth every 6 (six) hours as needed for anxiety.      . methylphenidate (RITALIN) 10 MG tablet Take 10 mg by mouth 2 (two) times daily as needed (for  additional focus).      . nitroGLYCERIN (NITROSTAT) 0.4 MG SL tablet Place 0.4 mg under the tongue every 5 (five) minutes as needed for chest pain.      Marland Kitchen olmesartan (BENICAR) 20 MG tablet Take 20 mg by mouth daily.      Marland Kitchen testosterone cypionate (DEPOTESTOTERONE CYPIONATE) 200 MG/ML injection Inject 200 mg into the muscle See admin instructions. Every week for 3 weeks, then skip a week and restart again      . aspirin EC 81 MG tablet Take 81 mg by mouth daily.       No current facility-administered medications for this visit.   Facility-Administered Medications Ordered in Other Visits  Medication Dose Route Frequency Provider Last Rate Last Dose  . ceFAZolin (ANCEF) 3 g in dextrose 5 % 50 mL IVPB  3 g Intravenous On Call to OR Maeola Harman, MD        No Known Allergies  Past Medical History  Diagnosis Date  . HTN (hypertension)   . Hypothyroidism   . Hyperlipidemia   . MS (multiple sclerosis)   . Coronary atherosclerosis of native coronary  artery     a. NSTEMI 04/20/11: tx with Promus DES to LAD; bifurcation lesion with oDx 90% tx with POBA; b. staged PCI of right post. AV Branch with Promus DES; c. residual at cath 04/21/11:  AV groove CFX 50%, mRCA 30%,; EF 55%  . Chicken pox as a child  . Heart attack 04-20-11  . Allergy   . Obesity   . Depression   . Onychomycosis   . Tinea pedis   . Keloid scar   . Preventative health care 10/07/2011  . Reflux 10/07/2011  . 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 Hypotestosteronism 11/17/2011  . Dermatitis, contact 12/10/2011  . Bronchitis, acute 12/10/2011  . Testosterone deficiency 01/16/2012  . Fatigue 10/15/2012  . Acute bronchitis 05/09/2013  . Neuromuscular disorder     NUMBNESS/TINGLING    Past Surgical History  Procedure Laterality Date  . Right wrist surgery      CTR  . Vasectomy    . Vascular surgery      vericose vein in right femoral area  . Wisdom tooth extraction    . Coronary angioplasty with stent placement       2012    History  Smoking status  . Never Smoker   Smokeless tobacco  . Never Used    History  Alcohol Use  . Yes    Comment: socially    Family History  Problem Relation Age of Onset  . Coronary artery disease      questionable in father  . Heart attack Mother 80  . Thyroid disease Mother   . Heart disease Mother   . Thyroid disease Sister   . Thyroid disease Brother   . Heart disease Maternal Grandmother   . Hypertension Maternal Grandmother   . Hyperlipidemia Maternal Grandmother   . Heart disease Maternal Grandfather   . Hypertension Maternal Grandfather   . Hyperlipidemia Maternal Grandfather   . Stroke Paternal Grandfather   . Thyroid disease Sister   . Heart disease Father     2 stents    Review of Systems: The review of systems is per the HPI.  All other systems were reviewed and are negative.  Physical Exam: BP 120/80  Pulse 81  Ht 6\' 3"  (1.905 m)  Wt 282 lb (127.914 kg)  BMI 35.25 kg/m2  SpO2 98% Patient is very pleasant and in no acute distress. He is down 4 pounds today. Skin is warm and dry. Color is normal.  HEENT is unremarkable. Normocephalic/atraumatic. PERRL. Sclera are nonicteric. Neck is supple. No masses. No JVD. Lungs are clear. Cardiac exam shows a regular rate and rhythm. Abdomen is soft. Extremities are without edema. Gait and ROM are intact. No gross neurologic deficits noted.  Wt Readings from Last 3 Encounters:  08/29/14 282 lb (127.914 kg)  08/15/14 286 lb (129.729 kg)  03/22/14 284 lb (128.822 kg)    LABORATORY DATA/PROCEDURES:  Lab Results  Component Value Date   WBC 8.2 08/15/2014   HGB 14.4 08/15/2014   HCT 42.2 08/15/2014   PLT 200 08/15/2014   GLUCOSE 91 08/15/2014   CHOL 151 11/01/2013   TRIG 54 11/01/2013   HDL 60 11/01/2013   LDLCALC 80 11/01/2013   ALT 28 11/01/2013   AST 18 11/01/2013   NA 140 08/15/2014   K 4.4 08/15/2014   CL 103 08/15/2014   CREATININE 0.89 08/15/2014   BUN 14 08/15/2014   CO2 27 08/15/2014   TSH 0.834  11/01/2013   INR  1.00 04/21/2011    BNP (last 3 results) No results found for this basename: PROBNP,  in the last 8760 hours  PROCEDURES:  1. Left heart catheterization.  2. Selective coronary angiography.  3. Left ventricular angiography.  4. PTCA and stenting of the proximal LAD.  5. PTCA of the first diagonal.  PROCEDURAL INDICATIONS: Mr. Luu is a 43 year old gentleman with a  history of multiple sclerosis. He presented with classic acute coronary  syndrome with marked anterior T-wave changes. He was treated with  heparin, aspirin, and Plavix and became chest pain free. He was  referred for cardiac catheterization.  Risks and indications of procedure were reviewed with the patient.  Informed consent was obtained. The right wrist was prepped and draped  and anesthetized with 1% lidocaine. Using the modified Seldinger  technique, a 6-French sheath was placed in the right radial artery. A 3  mg of verapamil was administered through the sheath. A 5000 units of  unfractionated heparin was given intravenously. A TIG catheter was used  for both left and right coronary angiography. A pigtail catheter was  used for left ventriculography. After the diagnostic procedure, I  elected to proceed with PCI. There were no complications from the  diagnostic procedure.  PROCEDURAL FINDINGS: Aortic pressure 107/76 with mean of 92, left  ventricular pressure 108/19.  Left mainstem. The left main is widely patent. It trifurcates into the  LAD, intermediate branch, and left circumflex.  LAD. The LAD is of large caliber. There is diffuse irregularity. The  mid LAD has a critical 90-95% stenosis involving the first diagonal  branch which is also severely diseased. The diagonal has 90% ostial  stenosis. The mid distal LAD has diffuse nonobstructive plaque without  high-grade disease.  Left circumflex. There is a large ramus intermedius with mild  nonobstructive disease proximally. The AV groove  circumflex supplies a  large first OM with mild disease but no high-grade stenosis. The  continuation of the AV groove circumflex has a 50% stenosis at the  trifurcation point of the circumflex. It courses down and gives off a  small posterolateral branch.  RCA. The RCA is a large, dominant vessel. There is proximal ectasia  and 30% mid stenosis. There are diffuse luminal irregularities of the  mid and distal RCA. It divides into a PDA branch and posterolateral  branch. The PDA is patent without significant stenosis. The  posterolateral branch has an 80-90% focal stenosis.  Left ventriculography shows normal LV function. The ejection fraction  is 55%.  PCI NOTE: Attention was turned to the LAD which was clearly the  patient's culprit for his acute coronary syndrome. A 6-French Ikari  left 3.5-cm guide catheter was used for the interventional procedure.  Bivalirudin was used for anticoagulation. The patient had been  preloaded with Plavix. Once the therapeutic ACT was achieved, a Cougar  guidewire was advanced into the apical LAD and the vessel was predilated  with a 2.5 x 15 mm balloon which was taken to 10 atmospheres. Two  inflations were done. There was improvement in the LAD lesion but there  was marked compromise of the diagonal. I elected to wire the diagonal  with a Cougar guidewire. This was done without difficulty. The LAD was  then stented with a 4.0 x 16 mm Promus drug-eluting stent. The stent  was deployed at 12 atmospheres and appeared well expanded. The diagonal  was totally occluded after stenting. The LAD result was excellent. The  patient began to have some  chest discomfort, so I elected to try to  rewire the diagonal in order to reopen it. A Whisper wire was attempted  but it was unsuccessful. I then pulled the indwelling Cougar guidewire  out of the diagonal branch and the branch regained flow, albeit TIMI 2.  There was residual high-grade ostial stenosis. I made a  further attempt  with a Whisper wire but could not enter the diagonal. I took a new  Cougar wire and was able to, with a moderate amount difficulty, get back  into the diagonal branch and the diagonal was dilated with a 2.0 x 12 mm  Apex balloon. This restored TIMI 3 flow in the diagonal. Kissing  balloons were then performed at the LAD diagonal bifurcation with a 4.0  x 15 mm [place] Number One Trek balloon in the LAD entirely within the stent and a 2.0 x  12 mm Apex balloon in the diagonal. The diagonal balloon was inflated  to 6 atmospheres. The LAD balloon was inflated to 14 atmospheres. Two  inflations were done in the LAD. There was an excellent result with  wide patency, 0% residual stenosis, and TIMI-3 flow in the LAD. The  diagonal had persistent ostial compromise of about 70-80%, but there was  TIMI-3 flow and the patient had resolution of his chest discomfort.  Final angiography was performed and the guide catheter and wires were  removed. The patient tolerated the procedure well. There were no  immediate complications.  ASSESSMENT:  1. Critical proximal-to-mid left anterior descending coronary artery  stenosis involving the left anterior descending coronary artery  diagonal bifurcation. Successful bifurcation percutaneous coronary  intervention was performed with a drug-eluting stent in the left  anterior descending coronary artery and balloon angioplasty of the  diagonal branch.  2. Severe posterolateral branch stenosis.  3. Nonobstructive left circumflex stenosis.  4. Normal left ventricular function.  DISCUSSION: The patient will need aggressive medical therapy. He has  diffuse coronary artery disease. We will change his Plavix to Effient  as I think he is at high risk of recurrent events. We will plan on  staged PCI of the posterolateral branch, likely tomorrow.  Veverly Fells. Excell Seltzer, MD  MDC/MEDQ D: 04/21/2011 T: 04/22/2011 Job: [id number]   PROCEDURE:  1. Selective coronary angiography.   2. PTCA and stenting of the posterior AV segment.  PROCEDURAL INDICATIONS: Mr. Cosma is a 43 year old gentleman who  presented with non-ST-elevation infarction. It was found to have  critical LAD stenosis and was treated yesterday with complex PTCA and  stenting of the LAD diagonal bifurcation. He was also noted to have  severe posterolateral stenosis and was brought in today for staged PCI.  The right wrist was prepped, draped and anesthetized with 1% lidocaine.  Using modified Seldinger technique, a 6-French sheath was placed in the  right radial artery without difficulty, 3 mg of verapamil was  administered through the sheath. Bivalirudin was used for  anticoagulation. A JL-3.5 cm diagnostic catheter was used to re-image  the left coronary artery to reassess the LAD diagonal bifurcation and  make sure the diagonal was still patent. The result from yesterday  remained stable with wide patency of the stented segment in the LAD and  continued TIMI III flow in the diagonal with tightly narrowed diagonal  ostium. Attention was then turned to the right coronary artery. A JR-4  guide catheter was inserted. Once a therapeutic ACT was achieved, a  Cougar guidewire was advanced into the distal posterolateral branch  across the  lesion. The vessel was predilated with a 2.0 x 12-mm apex  balloon which was taken to 8 atmospheres. The vessel was then stented  back to the origin. Just beyond the bifurcation point of the PDA, a 2.5  x 12-mm Promus element drug-eluting stent platform was chosen. The  stent was deployed at 12 atmospheres and appeared well expanded. The  stent was postdilated with a 2.75 x 8-mm Dixie Quantum apex balloon which  was taken to 16 atmospheres for 2 inflations. There was an excellent  angiographic result with 0% residual stenosis and TIMI III flow.  FINAL CONCLUSIONS:  1. Continued patency at the LAD stent site with patency of the  diagonal branch, PTCA site.  2. Successful  stenting of the right posterior AV segment branch using  a drug-eluting stent platform.  Veverly Fells. Excell Seltzer, MD  MDC/MEDQ D: 04/22/2011 T: 04/23/2011 Job: [id number]    Assessment / Plan: 1. CAD - prior multivessel PCI - stable clinically with no symptoms. Needs to restart his aspirin following his surgery today if ok with neurosurgery.  2. HTN - BP good.   3 HLD - rechecking lipids and LFTs today  4. Obesity - he is actually down 4 pounds. Hopefully his procedure today will allow him to "move more".   5. MS - this seems to be his most limiting factor - for baclofen pump placement today.   See back in one year. Consider repeat Myoview on return.  Patient is agreeable to this plan and will call if any problems develop in the interim.   Rosalio Macadamia, RN, ANP-C [facility] [address] [place], [place]  [address] [phone]

## 2014-08-29 NOTE — Transfer of Care (Signed)
Immediate Anesthesia Transfer of Care Note  Patient: Keith Morrow  Procedure(s) Performed: Procedure(s) with comments: Baclofen pump placement (N/A) - Baclofen pump placement  Patient Location: PACU  Anesthesia Type:General  Level of Consciousness: awake  Airway & Oxygen Therapy: Patient Spontanous Breathing and Patient connected to nasal cannula oxygen  Post-op Assessment: Report given to PACU RN and Post -op Vital signs reviewed and stable  Post vital signs: Reviewed and stable  Complications: No apparent anesthesia complications

## 2014-08-29 NOTE — Anesthesia Preprocedure Evaluation (Signed)
Anesthesia Evaluation  Patient identified by MRN, date of birth, ID band Patient awake    Reviewed: Allergy & Precautions, H&P , NPO status , Patient's Chart, lab work & pertinent test results  Airway Mallampati: II  Neck ROM: full    Dental   Pulmonary neg pulmonary ROS,          Cardiovascular hypertension, + CAD, + Past MI, + Cardiac Stents and + Peripheral Vascular Disease     Neuro/Psych Depression Has MS  Neuromuscular disease    GI/Hepatic   Endo/Other  Hypothyroidism obese  Renal/GU      Musculoskeletal   Abdominal   Peds  Hematology   Anesthesia Other Findings   Reproductive/Obstetrics                           Anesthesia Physical Anesthesia Plan  ASA: III  Anesthesia Plan: General   Post-op Pain Management:    Induction: Intravenous  Airway Management Planned: Oral ETT  Additional Equipment:   Intra-op Plan:   Post-operative Plan: Extubation in OR  Informed Consent: I have reviewed the patients History and Physical, chart, labs and discussed the procedure including the risks, benefits and alternatives for the proposed anesthesia with the patient or authorized representative who has indicated his/her understanding and acceptance.     Plan Discussed with: CRNA, Anesthesiologist and Surgeon  Anesthesia Plan Comments:         Anesthesia Quick Evaluation

## 2014-08-29 NOTE — Progress Notes (Signed)
Dr Venetia Maxon feels his increased spasticity is d/t extended period of lack of baclofen today / has been given 1 dose  baclofen and  diazepam po. There is noted  Improvement since arrival pacu and significantly less spasticity

## 2014-08-29 NOTE — Patient Instructions (Addendum)
Stay on your current medicines  We will recheck labs today  See Dr. Excell Seltzer in a year  Good luck today  Call the [facility] office at [phone] if you have any questions, problems or concerns.

## 2014-08-29 NOTE — H&P (Signed)
> [address] [place], [place] [address] Phone: [phone]   Patient ID:   [id number] Patient: Keith Morrow  Date of Birth: 1971-07-08 Visit Type: Office Visit   Date: 07/31/2014 09:15 AM Provider: Danae Orleans. Venetia Maxon MD   This 43 year old male presents for Spasticity.  History of Present Illness: 1.  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  He reports worsening MS s/s lately, including spasticity and right leg pain.  Pt fell Saturday, resulting in right hip pain & abrasion right elbow.  Hx: HTN, MI '12, MS, hypothyroid, depression SxHx: 04/24/11 stents x2 - DrCooper  Lumbar MRI on [facility]  From [employer]:  DrSater's baclofen trial:  36ml pump, 574mcg/ml, 167mcg/day  Patient is ready to have a baclofen pump placed.  He has had problems with increased spasticity and felt that the trial significantly helped him.  He has spasticity in his right leg mostly and can't dorsiflex his right foot his right knee is extremely stiff and he has altered his gait to compensate for his right leg stiffness.  He denies left leg spasticity.  After the trial he did not notice any significant change in his left leg strength or any weakness as a result of decreasing his increased tone on the right side.  Patient notes poor erectile function.  Patient indicates that he would prefer the pump on the right side of his abdomen.        PAST MEDICAL/SURGICAL HISTORY   (Detailed)  Disease/disorder Onset Date Management Date Comments    2 cardiac stents 2012   Coronary artery disease      Depression      High cholesterol      Hypertension      Multiple sclerosis      Myocardial infarction 2012     Thyroid disease          PAST MEDICAL HISTORY, SURGICAL HISTORY, FAMILY HISTORY, SOCIAL HISTORY AND REVIEW OF SYSTEMS I have reviewed the patient's past medical, surgical, family and social  history as well as the comprehensive review of systems as included on the [facility] history form dated 07/31/2014, which I have signed.  Family History  (Detailed)  Relationship Family Member Name Deceased Age at Death Condition Onset Age Cause of Death  Father    Myocardial infarction  N  Mother    Myocardial infarction  N   SOCIAL HISTORY  (Detailed) Tobacco use reviewed. Preferred language is Unknown.   Smoking status: Never smoker.  SMOKING STATUS Use Status Type Smoking Status Usage Per Day Years Used Total Pack Years  no/never  Never smoker             MEDICATIONS(added, continued or stopped this visit):   Started Medication Directions Instruction Stopped   Ampyra 10 mg tablet,extended release take 1 tablet by oral route 2 times every day     aspirin 81 mg tablet,delayed release take 1 tablet by oral route  every day     Ativan 1 mg tablet take 1 tablet by oral route 3 times every day as needed     baclofen 20 mg tablet take 1 tablet by oral route 4- 8 times every day     Benicar 20 mg tablet take 1 tablet by oral route  every day     Cymbalta 60 mg capsule,delayed release take 1 capsule by oral route 2 times every day  Levitra 10 mg tablet take 1 tablet by oral route  every day as needed approximately 1 hour before sexual activity     Lipitor 40 mg tablet take 1 tablet by oral route  every day     Synthroid 150 mcg tablet take 1 tablet by oral route  every day     Tysabri 300 mg/15 mL intravenous solution infuse 15 milliliter by intravenous route  every 4 weeks diluted in 100 mL of 0.9 % NaCl over     Zyrtec 10 mg tablet take 1 tablet by oral route  every day as needed      ALLERGIES:  Ingredient Reaction Medication Name Comment  NO KNOWN ALLERGIES     No known allergies.  REVIEW OF SYSTEMS System Neg/Pos Details  Constitutional Negative Chills, fatigue, fever, malaise, night sweats, weight gain and weight loss.  ENMT Negative  Ear drainage, hearing loss, nasal drainage, otalgia, sinus pressure and sore throat.  Eyes Negative Eye discharge, eye pain and vision changes.  Respiratory Negative Chronic cough, cough, dyspnea, known TB exposure and wheezing.  Cardio Negative Chest pain, claudication, edema and irregular heartbeat/palpitations.  GI Negative Abdominal pain, blood in stool, change in stool pattern, constipation, decreased appetite, diarrhea, heartburn, nausea and vomiting.  GU Negative Dribbling, dysuria, erectile dysfunction, hematuria, polyuria, slow stream, urinary frequency, urinary incontinence and urinary retention.  Endocrine Negative Cold intolerance, heat intolerance, polydipsia and polyphagia.  Neuro Positive Gait disturbance, Numbness in extremity, Spasticity.  Psych Positive Depression.  Integumentary Negative Brittle hair, brittle nails, change in shape/size of mole(s), hair loss, hirsutism, hives, pruritus, rash and skin lesion.  MS Positive RLE pain.  Hema/Lymph Negative Easy bleeding, easy bruising and lymphadenopathy.  Allergic/Immuno Negative Contact allergy, environmental allergies, food allergies and seasonal allergies.  Reproductive Negative Penile discharge and sexual dysfunction.    Vitals Date Temp F BP Pulse Ht In Wt Lb BMI BSA Pain Score  07/31/2014  117/73 87 75 286 35.75  0/10     PHYSICAL EXAM General Level of Distress: no acute distress Overall Appearance: normal  Head and Face  Right Left  Fundoscopic Exam:  normal normal    Cardiovascular Cardiac: regular rate and rhythm without murmur  Right Left  Carotid Pulses: normal normal  Respiratory Lungs: clear to auscultation  Neurological Orientation: normal Recent and Remote Memory: normal Attention Span and Concentration:   normal Language: normal Fund of Knowledge: normal  Right Left Sensation: normal normal Upper Extremity Coordination: normal normal  Lower Extremity  Coordination: normal normal  Musculoskeletal Gait and Station: normal  Right Left Upper Extremity Muscle Strength: normal normal Lower Extremity Muscle Strength: normal normal Upper Extremity Muscle Tone:  normal normal Lower Extremity Muscle Tone: spastic normal  Motor Strength Upper and lower extremity motor strength was tested in the clinically pertinent muscles.     Deep Tendon Reflexes  Right Left Biceps: normal normal Triceps: normal normal Brachiloradialis: normal normal Patellar: increase increase Achilles: increase increase  Cranial Nerves II. Optic Nerve/Visual Fields: normal III. Oculomotor: normal IV. Trochlear: normal V. Trigeminal: normal VI. Abducens: normal VII. Facial: normal VIII. Acoustic/Vestibular: normal IX. Glossopharyngeal: normal X. Vagus: normal XI. Spinal Accessory: normal XII. Hypoglossal: normal  Motor and other Tests Lhermittes: negative Rhomberg: negative Pronator drift: absent     Right Left Hoffman's: normal normal Clonus: normal normal Babinski: normal normal   Additional Findings:  Patient notes numbness in his fingers.  He walks with 2 canes.  He has a difficult time of his gait because  of the spasticity in his right leg.      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  He has been worsening with this and has very severe spasticity in his right leg.  He had a baclofen trial with Dr. Sherol Dade and wishes to proceed with baclofen pump placement.  Completed Orders (this encounter) Order Details Reason Side Interpretation Result Initial Treatment Date Region  Lifestyle education regarding diet Encouraged to eat a well balanced diet and follow up with primary care physician.         Assessment/Plan # Detail Type Description   1. Assessment MS (multiple sclerosis) (340).       2. Assessment Abnormal gait (781.2).       3. Assessment BMI 35.0-35.9,ADULT (V85.35).   Plan Orders Today's instructions /  counseling include(s) Lifestyle education regarding diet.         Pain Assessment/Treatment Pain Scale: 0/10. Method: Numeric Pain Intensity Scale.  The patient was shown models we went over risks and benefits of surgery.  He wishes to proceed.  We are tentatively scheduling this after the first week of September.  Orders: Diagnostic Procedures: Assessment Procedure  340 Baclofen pump placement  Instruction(s)/Education: Assessment Instruction  V85.35 Lifestyle education regarding diet             Provider:  Danae Orleans. Venetia Maxon MD  08/06/2014 07:17 PM Dictation edited by: Danae Orleans. Venetia Maxon    CC Providers: Despina Arias [address] [place], [place] [address]- ----------------------------------------------------------------------------------------------------------------------------------------------------------------------         Electronically signed by Danae Orleans Venetia Maxon MD on 08/06/2014 07:17 PM

## 2014-08-29 NOTE — Brief Op Note (Signed)
08/29/2014  5:00 PM  PATIENT:  Keith Morrow  43 y.o. male  PRE-OPERATIVE DIAGNOSIS:  Multiple sclerosis, abnormal gait, spasticity  POST-OPERATIVE DIAGNOSIS:  Multiple sclerosis, abnormal gait, spasticity  PROCEDURE:  Procedure(s) with comments: Baclofen pump placement (N/A) - Baclofen pump placement  SURGEON:  Surgeon(s) and Role:    * Tanji Storrs, MD - Primary  PHYSICIAN ASSISTANT:   ASSISTANTS: Poteat, RN   ANESTHESIA:   general  EBL:  Total I/O In: 1300 [I.V.:1300] Out: -   BLOOD ADMINISTERED:none  DRAINS: none   LOCAL MEDICATIONS USED:  LIDOCAINE   SPECIMEN:  No Specimen  DISPOSITION OF SPECIMEN:  N/A  COUNTS:  YES  TOURNIQUET:  * No tourniquets in log *  DICTATION: Patient has pasticity secondary to Multiple Sclerosis. He did well with a trial of intrathecal baclofen.  He presents for baclofen pump placement.    PROCEDURE: Patient was brought to the operating room and was placed in left lateral position with axillary roll on beanbag..  Right back and upper quadrant was prepped with betadine scrub and Duraprep.  Touhy needle was placed into spine using C arm.  There was brisk return of CSF.  A cut down was performed around needle and fascia was exposed.  Catheter was passed to level of T 6.  Anchors and purse string suture were placed.  Abdominal incision was created with pocket.  2-0 silk stay sutures were placed.  Pump was filled, catheter passed to abdomen and connected to catheter assembly and this to pump.  Free flow of CSF was established prior to connecting to pump.   There was spontaneous flow of CSF.  The new baclofen pump was filled and connected to the catheter.  3, 2-0 silk sutures were placed in the pocket. The new pump was then internalized. Wound was irrigated.   Incisions were closed with 2-0 Vicryl and 3-0 vicryl sutures and dressed with a sterile occlusive dressing.  Counts were correct at the end of the case.  PLAN OF CARE: Admit for overnight  observation  PATIENT DISPOSITION:  PACU - hemodynamically stable.   Delay start of Pharmacological VTE agent (>24hrs) due to surgical blood loss or risk of bleeding: yes  

## 2014-08-29 NOTE — Progress Notes (Signed)
On exam, patient starting to move legs, but spastic in both.  Left leg is becoming looser.  Patient has taken oral baclofen and should begin to get benefits from pump after priming.  No sensory deficit or upper extremity weakness.

## 2014-08-29 NOTE — Op Note (Signed)
08/29/2014  5:00 PM  PATIENT:  Keith Morrow  43 y.o. male  PRE-OPERATIVE DIAGNOSIS:  Multiple sclerosis, abnormal gait, spasticity  POST-OPERATIVE DIAGNOSIS:  Multiple sclerosis, abnormal gait, spasticity  PROCEDURE:  Procedure(s) with comments: Baclofen pump placement (N/A) - Baclofen pump placement  SURGEON:  Surgeon(s) and Role:    * Maeola Harman, MD - Primary  PHYSICIAN ASSISTANT:   ASSISTANTS: Poteat, RN   ANESTHESIA:   general  EBL:  Total I/O In: 1300 [I.V.:1300] Out: -   BLOOD ADMINISTERED:none  DRAINS: none   LOCAL MEDICATIONS USED:  LIDOCAINE   SPECIMEN:  No Specimen  DISPOSITION OF SPECIMEN:  N/A  COUNTS:  YES  TOURNIQUET:  * No tourniquets in log *  DICTATION: Patient has pasticity secondary to Multiple Sclerosis. He did well with a trial of intrathecal baclofen.  He presents for baclofen pump placement.    PROCEDURE: Patient was brought to the operating room and was placed in left lateral position with axillary roll on beanbag..  Right back and upper quadrant was prepped with betadine scrub and Duraprep.  Touhy needle was placed into spine using C arm.  There was brisk return of CSF.  A cut down was performed around needle and fascia was exposed.  Catheter was passed to level of T 6.  Anchors and purse string suture were placed.  Abdominal incision was created with pocket.  2-0 silk stay sutures were placed.  Pump was filled, catheter passed to abdomen and connected to catheter assembly and this to pump.  Free flow of CSF was established prior to connecting to pump.   There was spontaneous flow of CSF.  The new baclofen pump was filled and connected to the catheter.  3, 2-0 silk sutures were placed in the pocket. The new pump was then internalized. Wound was irrigated.   Incisions were closed with 2-0 Vicryl and 3-0 vicryl sutures and dressed with a sterile occlusive dressing.  Counts were correct at the end of the case.  PLAN OF CARE: Admit for overnight  observation  PATIENT DISPOSITION:  PACU - hemodynamically stable.   Delay start of Pharmacological VTE agent (>24hrs) due to surgical blood loss or risk of bleeding: yes

## 2014-08-29 NOTE — Anesthesia Procedure Notes (Signed)
Procedure Name: Intubation Date/Time: 08/29/2014 3:59 PM Performed by: Brien Mates D Pre-anesthesia Checklist: Patient identified, Emergency Drugs available, Suction available, Patient being monitored and Timeout performed Patient Re-evaluated:Patient Re-evaluated prior to inductionOxygen Delivery Method: Circle system utilized Preoxygenation: Pre-oxygenation with 100% oxygen Intubation Type: IV induction Ventilation: Mask ventilation without difficulty and Oral airway inserted - appropriate to patient size Laryngoscope Size: Mac and 4 (attempt x 1 with miller 2. successful attempt with MAC 4) Grade View: Grade II Tube type: Oral Tube size: 7.5 mm Number of attempts: 2 (grade3/4 view with miller 2. grade 2 view with MAC 4) Airway Equipment and Method: Stylet Placement Confirmation: ETT inserted through vocal cords under direct vision,  positive ETCO2 and breath sounds checked- equal and bilateral Secured at: 23 cm Tube secured with: Tape Dental Injury: Teeth and Oropharynx as per pre-operative assessment

## 2014-08-30 ENCOUNTER — Encounter (HOSPITAL_COMMUNITY): Payer: Self-pay | Admitting: *Deleted

## 2014-08-30 DIAGNOSIS — G35 Multiple sclerosis: Secondary | ICD-10-CM | POA: Diagnosis not present

## 2014-08-30 MED ORDER — ALUM & MAG HYDROXIDE-SIMETH 200-200-20 MG/5ML PO SUSP
30.0000 mL | ORAL | Status: DC | PRN
  Administered 2014-08-30: 30 mL via ORAL
  Filled 2014-08-30: qty 30

## 2014-08-30 MED ORDER — HYDROCODONE-ACETAMINOPHEN 5-325 MG PO TABS: 1.0000 | ORAL_TABLET | ORAL | Status: DC | PRN

## 2014-08-30 MED FILL — Baclofen Intrathecal Inj 40 MG/20ML (2000 MCG/ML): INTRATHECAL | Qty: 20 | Status: AC

## 2014-08-30 NOTE — Progress Notes (Signed)
Subjective: Patient reports improved spasticity  Objective: Vital signs in last 24 hours: Temp:  [97.2 F (36.2 C)-98.4 F (36.9 C)] 98.2 F (36.8 C) (09/23 0550) Pulse Rate:  [80-121] 106 (09/23 0550) Resp:  [16-21] 18 (09/23 0550) BP: (107-157)/(63-110) 143/76 mmHg (09/23 0550) SpO2:  [91 %-100 %] 98 % (09/23 0550) Weight:  [127.914 kg (282 lb)] 127.914 kg (282 lb) (09/23 0700)  Intake/Output from previous day: 09/22 0701 - 09/23 0700 In: 1300 [I.V.:1300] Out: 325 [Urine:325] Intake/Output this shift:    Physical Exam: Still quite spastic in both legs, but better than last night.  Left leg looser than right.  Having difficulty urinating.  Wear binder.  Lab Results: No results found for this basename: WBC, HGB, HCT, PLT,  in the last 72 hours BMET No results found for this basename: NA, K, CL, CO2, GLUCOSE, BUN, CREATININE, CALCIUM,  in the last 72 hours  Studies/Results: Dg C-arm 1-60 Min-no Report  08/29/2014   CLINICAL DATA: Multiple sclerosis, abnormal gait   C-ARM 1-60 MINUTES  Fluoroscopy was utilized by the requesting physician.  No radiographic  interpretation.     Assessment/Plan: Will increase baclofen dose if possible today.  Bladder scan with possible Foley prior to D/C.  Mild positional headache improving today.    LOS: 1 day    Dorian Heckle, MD 08/30/2014, 8:11 AM

## 2014-08-30 NOTE — Discharge Summary (Signed)
Physician Discharge Summary  Patient ID: QUADARIUS LIPUMA MRN: [id number] DOB/AGE: [birth year] 43 y.o.  Admit date: 08/29/2014 Discharge date: 08/30/2014  Admission Diagnoses:Spasticity due to MS  Discharge Diagnoses: Same Active Problems:   MS (multiple sclerosis)   Discharged Condition: good  Hospital Course: Patient underwent baclofen pump placement.  He had increased tone immediately after surgery, better the next morning.  He had urinary retention with incomplete voiding, which improved prior to discharge.  Consults: None  Significant Diagnostic Studies: None  Treatments: surgery: Intrathecal baclofen pump placement  Discharge Exam: Blood pressure 143/76, pulse 106, temperature 98.2 F (36.8 C), temperature source Oral, resp. rate 18, weight 127.914 kg (282 lb), SpO2 98.00%. Neurologic: Alert and oriented X 3, normal strength and increased tone. Wounds: CDI  Disposition: Home     Medication List         AMPYRA 10 MG Tb12  Generic drug:  dalfampridine  Take 10 mg by mouth 2 (two) times daily. Every 12 hours     aspirin EC 81 MG tablet  Take 81 mg by mouth daily.     atorvastatin 40 MG tablet  Commonly known as:  LIPITOR  Take 1 tablet (40 mg total) by mouth daily.     baclofen 20 MG tablet  Commonly known as:  LIORESAL  Take 20 mg by mouth 4 (four) times daily.     cetirizine 10 MG tablet  Commonly known as:  ZYRTEC  Take 10 mg by mouth daily as needed for allergies.     DULoxetine 60 MG capsule  Commonly known as:  CYMBALTA  Take 60 mg by mouth 2 (two) times daily.     HYDROcodone-acetaminophen 5-325 MG per tablet  Commonly known as:  NORCO/VICODIN  Take 1-2 tablets by mouth every 4 (four) hours as needed (mild pain).     LEVITRA 10 MG tablet  Generic drug:  vardenafil  Take 10 mg by mouth as needed.     levothyroxine 150 MCG tablet  Commonly known as:  SYNTHROID, LEVOTHROID  Take 150 mcg by mouth daily before breakfast.     LORazepam 1 MG  tablet  Commonly known as:  ATIVAN  Take 1 mg by mouth every 6 (six) hours as needed for anxiety.     methylphenidate 10 MG tablet  Commonly known as:  RITALIN  Take 10 mg by mouth 2 (two) times daily as needed (for additional focus).     nitroGLYCERIN 0.4 MG SL tablet  Commonly known as:  NITROSTAT  Place 0.4 mg under the tongue every 5 (five) minutes as needed for chest pain.     olmesartan 20 MG tablet  Commonly known as:  BENICAR  Take 20 mg by mouth daily.     testosterone cypionate 200 MG/ML injection  Commonly known as:  DEPOTESTOTERONE CYPIONATE  Inject 200 mg into the muscle See admin instructions. Every week for 3 weeks, then skip a week and restart again         Signed: Dorian Heckle, MD 08/30/2014, 8:14 AM

## 2014-08-30 NOTE — Evaluation (Addendum)
Occupational Therapy Evaluation Patient Details Name: Keith Morrow MRN: [id number] DOB: November 09, 1971 Today's Date: 08/30/2014    History of Present Illness Pt admitted for baclofen pump placement due to MS.   Clinical Impression   Pt s/p above. Pt independent with ADLs, PTA. Feel pt will benefit from acute OT to increase independence prior to d/c. Family available to assist pt upon d/c.     Follow Up Recommendations  No OT follow up;Supervision/Assistance - 24 hour    Equipment Recommendations  Other (comment) (family reports they want to see if they have shower chair )    Recommendations for Other Services       Precautions / Restrictions Precautions Precautions: Fall      Mobility Bed Mobility               General bed mobility comments: not assessed  Transfers Overall transfer level: Needs assistance Equipment used: Rolling walker (2 wheeled) Transfers: Sit to/from Stand Sit to Stand: Mod assist         General transfer comment: cues for technique. Assist to power up.    Balance                                            ADL Overall ADL's : Needs assistance/impaired                     Lower Body Dressing: Sit to/from stand;With adaptive equipment;Moderate assistance   Toilet Transfer: Minimal assistance;Moderate assistance;Ambulation;RW (chair; Mod A to stand)           Functional mobility during ADLs: Minimal assistance;Rolling walker General ADL Comments: Educated on AE for LB ADLs. Pt practiced with sockaid and reacher. Educated on safety-use of bag on walker, discussed shoewear, sitting for LB ADLs, pets, clutter, rugs. Recommended someone be with pt for shower transfer and discussed alternative techniques and DME options. Told pt functional activities to be doing for fine motor coordination (has been to OT for this before). Ambulated some in hallway. Discussed to try to avoid a lot of bending/arching, twisting due to  incision in back.Discussed how HHPT can address shower or tub transfer at home-pt not wanting HHOT at this time (has family members that is an OT they report). Educated on dressing technique.     Vision                     Perception     Praxis      Pertinent Vitals/Pain Pain Assessment: 0-10 Pain Score: 4  Pain Location: RLE and right wrist Pain Intervention(s): Monitored during session     Hand Dominance     Extremity/Trunk Assessment Upper Extremity Assessment Upper Extremity Assessment: RUE deficits/detail;LUE deficits/detail RUE Coordination: decreased fine motor LUE Coordination: decreased fine motor   Lower Extremity Assessment Lower Extremity Assessment: Defer to PT evaluation       Communication Communication Communication: No difficulties   Cognition Arousal/Alertness: Awake/alert Behavior During Therapy: WFL for tasks assessed/performed Overall Cognitive Status: Within Functional Limits for tasks assessed                     General Comments       Exercises       Shoulder Instructions      Home Living Family/patient expects to be discharged to:: Private residence Living Arrangements: Spouse/significant other Available  Help at Discharge: Family Type of Home: House Home Access: Ramped entrance     Home Layout: Two level Alternate Level Stairs-Number of Steps: 2 steps into den   Bathroom Shower/Tub: Tub/shower unit;Walk-in shower   Bathroom Toilet:  (pushes up on sink and window)     Home Equipment: Walker - 2 wheels;Cane - single point          Prior Functioning/Environment Level of Independence: Independent with assistive device(s)             OT Diagnosis: Acute pain;Generalized weakness   OT Problem List: Decreased coordination;Impaired balance (sitting and/or standing);Decreased strength;Decreased activity tolerance;Decreased knowledge of use of DME or AE;Decreased knowledge of precautions;Pain   OT  Treatment/Interventions: Self-care/ADL training;DME and/or AE instruction;Therapeutic activities;Patient/family education;Balance training;Therapeutic exercise    OT Goals(Current goals can be found in the care plan section) Acute Rehab OT Goals Patient Stated Goal: get better OT Goal Formulation: With patient Time For Goal Achievement: 09/06/14 Potential to Achieve Goals: Good ADL Goals Pt Will Perform Lower Body Dressing: with min assist;with adaptive equipment;sit to/from stand Pt Will Transfer to Toilet: with supervision;ambulating Pt Will Perform Tub/Shower Transfer: Tub transfer;Shower transfer;ambulating;shower seat;rolling walker  OT Frequency: Min 2X/week   Barriers to D/C:            Co-evaluation              End of Session Equipment Utilized During Treatment: Gait belt;Rolling walker  Activity Tolerance: Patient tolerated treatment well Patient left: in chair;with call bell/phone within reach;with family/visitor present   Time: 6962-9528 OT Time Calculation (min): 42 min Charges:  OT General Charges $OT Visit: 1 Procedure OT Evaluation $Initial OT Evaluation Tier I: 1 Procedure OT Treatments $Self Care/Home Management : 8-22 mins G-Codes: OT G-codes **NOT FOR INPATIENT CLASS** Functional Assessment Tool Used: clinical judgment Functional Limitation: Self care Self Care Current Status (U1324): At least 40 percent but less than 60 percent impaired, limited or restricted  Earlie Raveling OTR/L [phone] 08/30/2014, 3:38 PM

## 2014-08-30 NOTE — Progress Notes (Signed)
RN talked to Keith Morrow (Dr. Fredrich Birks Nurse) to confirm if patient can be d/c at this time. He did give the go ahead if patient is doing well. At this time patient can be d/c because he is doing well. Assessments remain the same at this time.

## 2014-08-30 NOTE — Evaluation (Signed)
Physical Therapy Evaluation Patient Details Name: Keith Morrow MRN: [id number] DOB: [birth year] Today's Date: 08/30/2014   History of Present Illness  Pt admitted for baclofen pump placement due to MS.  Clinical Impression  Pt required mod assist for bed mobility and transfers.  Min assist needed for ambulation 100 feet with RW.  Pt demo scissoring gait with decreased foot clearance bilaterally.  Pt reports he will have needed level of assist at home.  Recommend HHPT upon d/c with plan for transition to neuro OPPT when appt available.    Follow Up Recommendations Home health PT;Supervision for mobility/OOB (HHPT with transitiion to neuro OPPT when appt available)    Equipment Recommendations  None recommended by PT    Recommendations for Other Services       Precautions / Restrictions Precautions Precautions: Fall      Mobility  Bed Mobility Overal bed mobility: Needs Assistance Bed Mobility: Supine to Sit     Supine to sit: Mod assist     General bed mobility comments: assist for BLE   Transfers Overall transfer level: Needs assistance Equipment used: Rolling walker (2 wheeled) Transfers: Sit to/from Stand Sit to Stand: Mod assist;From elevated surface         General transfer comment: assist to power up  Ambulation/Gait Ambulation/Gait assistance: Min assist Ambulation Distance (Feet): 100 Feet Assistive device: Rolling walker (2 wheeled) Gait Pattern/deviations: Scissoring;Decreased step length - right;Decreased step length - left Gait velocity: decreased   General Gait Details: decreased foot clearance bilaterally, heavy reliance on RW  Stairs            Wheelchair Mobility    Modified [name] (Stroke Patients Only)       Balance                                             Pertinent Vitals/Pain Pain Assessment: 0-10 Pain Score: 4  Pain Location: R forearm (IV infiltrated, RN notified) Pain Intervention(s): Monitored  during session    Home Living Family/patient expects to be discharged to:: Private residence Living Arrangements: Spouse/significant other Available Help at Discharge: Family Type of Home: House Home Access: Ramped entrance     Home Layout: Two level Home Equipment: Environmental consultant - 2 wheels;Cane - single point Additional Comments: He has 2 SPC.    Prior Function Level of Independence: Independent with assistive device(s)               Hand Dominance        Extremity/Trunk Assessment   Upper Extremity Assessment: Overall WFL for tasks assessed           Lower Extremity Assessment: RLE deficits/detail;LLE deficits/detail RLE Deficits / Details: abnormal tone due to MS, distal > proximal LLE Deficits / Details: abnormal tone due to MD, distal > proximal     Communication   Communication: No difficulties  Cognition Arousal/Alertness: Awake/alert Behavior During Therapy: WFL for tasks assessed/performed Overall Cognitive Status: Within Functional Limits for tasks assessed                      General Comments      Exercises        Assessment/Plan    PT Assessment All further PT needs can be met in the next venue of care  PT Diagnosis Abnormality of gait   PT Problem List Decreased strength;Decreased activity  tolerance;Decreased balance;Decreased mobility;Decreased coordination;Impaired tone  PT Treatment Interventions     PT Goals (Current goals can be found in the Care Plan section) Acute Rehab PT Goals Patient Stated Goal: return to walking with 2 canes PT Goal Formulation: No goals set, d/c therapy    Frequency     Barriers to discharge        Co-evaluation               End of Session Equipment Utilized During Treatment: Gait belt Activity Tolerance: Patient tolerated treatment well Patient left: in chair;with family/visitor present;with call bell/phone within reach Nurse Communication: Mobility status;Other (comment) (IV  infiltrated)    Functional Assessment Tool Used: clinical judgement Functional Limitation: Mobility: Walking and moving around Mobility: Walking and Moving Around Current Status [phone]): At least 20 percent but less than 40 percent impaired, limited or restricted Mobility: Walking and Moving Around Goal Status [phone]): At least 20 percent but less than 40 percent impaired, limited or restricted Mobility: Walking and Moving Around Discharge Status [phone]): At least 20 percent but less than 40 percent impaired, limited or restricted    Time: 0959-1033 PT Time Calculation (min): 34 min   Charges:   PT Evaluation $Initial PT Evaluation Tier I: 1 Procedure PT Treatments $Gait Training: 23-37 mins   PT G Codes:   Functional Assessment Tool Used: clinical judgement Functional Limitation: Mobility: Walking and moving around    Lorriane Shire 08/30/2014, 10:47 AM

## 2014-08-30 NOTE — Progress Notes (Signed)
UR completed 

## 2014-08-30 NOTE — Progress Notes (Signed)
Per MD, though patient has active d/c order, he is not leaving until his [employer] has been evaluated. Patient aware.

## 2014-08-30 NOTE — Care Management Note (Addendum)
  Page 1 of 1   08/30/2014     2:14:28 PM CARE MANAGEMENT NOTE 08/30/2014  Patient:  TRACKER, MANCE   Account Number:  [id number]  Date Initiated:  08/30/2014  Documentation initiated by:  Lorne Skeens  Subjective/Objective Assessment:   Patient was admitted for baclofen pump placement.     Action/Plan:   Will follow for discharge needs pending PT/OT evals and physician orders.   Anticipated DC Date:  08/30/2014   Anticipated DC Plan:  [place]  CM consult      Choice offered to / List presented to:          [facility] arranged  [place].   Status of service:  Completed, signed off Medicare Important Message given?   (If response is "NO", the following Medicare IM given date fields will be blank) Date Medicare IM given:   Medicare IM given by:   Date Additional Medicare IM given:   Additional Medicare IM given by:    Discharge Disposition:  [place]  Per UR Regulation:  Reviewed for med. necessity/level of care/duration of stay  If discussed at [place] of Stay Meetings, dates discussed:    Comments:  08/30/14 Brooks, MSN, CM- Met with patient to discuss home health needs. Patient is agreeable to home helath and has chosen [facility].  Mary with [facility] was notified and has accepted the referral for discharge home today.  Address and phone number were verified in the chart.

## 2014-09-01 NOTE — Anesthesia Postprocedure Evaluation (Signed)
Anesthesia Post Note  Patient: Keith Morrow  Procedure(s) Performed: Procedure(s) (LRB): Baclofen pump placement (N/A)  Anesthesia type: General  Patient location: PACU  Post pain: Pain level controlled and Adequate analgesia  Post assessment: Post-op Vital signs reviewed, Patient's Cardiovascular Status Stable, Respiratory Function Stable, Patent Airway and Pain level controlled  Last Vitals:  Filed Vitals:   08/30/14 1035  BP: 149/78  Pulse: 97  Temp: 36.6 C  Resp: 18    Post vital signs: Reviewed and stable  Level of consciousness: awake, alert  and oriented  Complications: No apparent anesthesia complications

## 2014-09-05 ENCOUNTER — Encounter (HOSPITAL_COMMUNITY): Payer: Self-pay | Admitting: Neurosurgery

## 2014-09-11 ENCOUNTER — Encounter (HOSPITAL_COMMUNITY): Payer: Self-pay | Admitting: Neurosurgery

## 2014-10-21 ENCOUNTER — Other Ambulatory Visit: Payer: Self-pay | Admitting: Cardiovascular Disease

## 2014-11-01 ENCOUNTER — Other Ambulatory Visit: Payer: Self-pay | Admitting: Family Medicine

## 2014-11-01 ENCOUNTER — Encounter: Payer: Self-pay | Admitting: Family Medicine

## 2014-11-01 NOTE — Telephone Encounter (Signed)
Patient has requested refill of below medication. Last testosterone check was 09/2013.  Next appt for CPE 03/2015 Ok to refill?

## 2014-11-03 MED ORDER — TESTOSTERONE CYPIONATE 200 MG/ML IM SOLN: 200.0000 mg | INTRAMUSCULAR | Status: DC

## 2014-11-03 NOTE — Telephone Encounter (Signed)
Please see if he is willing to have his testosterone checked this coming week or two but I have sent in his refill for now

## 2014-11-06 ENCOUNTER — Other Ambulatory Visit: Payer: Self-pay | Admitting: Family Medicine

## 2014-11-06 DIAGNOSIS — G35 Multiple sclerosis: Secondary | ICD-10-CM

## 2014-11-06 MED ORDER — METHYLPHENIDATE HCL 10 MG PO TABS: 10.0000 mg | ORAL_TABLET | Freq: Two times a day (BID) | ORAL | Status: DC | PRN

## 2014-11-06 NOTE — Telephone Encounter (Signed)
Patient request refill on Ritalin 10 mg Last fill noted 08-2014 10mg  bid PRN Please advise

## 2014-11-11 ENCOUNTER — Encounter: Payer: Self-pay | Admitting: Family Medicine

## 2014-11-13 NOTE — Telephone Encounter (Signed)
Prior authorization has been initiated. Patient states he has been on brand name Cymbalta for almost ten years and has tried generics in the past without success.

## 2014-11-28 ENCOUNTER — Encounter: Payer: Self-pay | Admitting: Family Medicine

## 2014-11-29 ENCOUNTER — Other Ambulatory Visit: Payer: Self-pay | Admitting: Family Medicine

## 2014-11-29 DIAGNOSIS — G35 Multiple sclerosis: Secondary | ICD-10-CM

## 2014-11-29 DIAGNOSIS — R531 Weakness: Secondary | ICD-10-CM

## 2014-11-29 DIAGNOSIS — R2681 Unsteadiness on feet: Secondary | ICD-10-CM

## 2014-12-31 ENCOUNTER — Other Ambulatory Visit: Payer: Self-pay | Admitting: Family Medicine

## 2015-01-02 ENCOUNTER — Telehealth: Payer: Self-pay

## 2015-01-02 NOTE — Telephone Encounter (Signed)
Spoke with Rosann Auerbach rep Elease Hashimoto and advised [name] is a pt. At [facility], has not been seen at [facility]--she should contact [facility] for questions regarding Tysabri order/fim

## 2015-01-02 NOTE — Telephone Encounter (Signed)
Last filled: 9.23.15 (hospital) and 7.31.15 ([name]) Last OV:  03/06/14 Next appt:  03/13/15  Med filled

## 2015-01-02 NOTE — Telephone Encounter (Signed)
[employer] calling to if you received Tysabri order form patient . Silvio Pate [phone]- call back . Fax [phone]. Needs faxed back Asap. Trying to ship Tysabri this coming Monday. Feb. 1st -16. Please have Faith call if she has any questions.

## 2015-01-05 ENCOUNTER — Encounter: Payer: Self-pay | Admitting: Family Medicine

## 2015-01-05 ENCOUNTER — Ambulatory Visit (INDEPENDENT_AMBULATORY_CARE_PROVIDER_SITE_OTHER): Payer: Managed Care, Other (non HMO) | Admitting: Family Medicine

## 2015-01-05 VITALS — BP 110/62 | HR 95 | Temp 97.7°F | Resp 16 | Wt 280.6 lb

## 2015-01-05 DIAGNOSIS — E039 Hypothyroidism, unspecified: Secondary | ICD-10-CM

## 2015-01-05 DIAGNOSIS — G35 Multiple sclerosis: Secondary | ICD-10-CM

## 2015-01-05 DIAGNOSIS — F32A Depression, unspecified: Secondary | ICD-10-CM

## 2015-01-05 DIAGNOSIS — Z0000 Encounter for general adult medical examination without abnormal findings: Secondary | ICD-10-CM

## 2015-01-05 DIAGNOSIS — F4323 Adjustment disorder with mixed anxiety and depressed mood: Secondary | ICD-10-CM

## 2015-01-05 DIAGNOSIS — E785 Hyperlipidemia, unspecified: Secondary | ICD-10-CM

## 2015-01-05 DIAGNOSIS — F329 Major depressive disorder, single episode, unspecified: Secondary | ICD-10-CM

## 2015-01-05 DIAGNOSIS — E782 Mixed hyperlipidemia: Secondary | ICD-10-CM

## 2015-01-05 DIAGNOSIS — IMO0001 Reserved for inherently not codable concepts without codable children: Secondary | ICD-10-CM

## 2015-01-05 DIAGNOSIS — E669 Obesity, unspecified: Secondary | ICD-10-CM

## 2015-01-05 DIAGNOSIS — Z23 Encounter for immunization: Secondary | ICD-10-CM

## 2015-01-05 DIAGNOSIS — I10 Essential (primary) hypertension: Secondary | ICD-10-CM

## 2015-01-05 DIAGNOSIS — K219 Gastro-esophageal reflux disease without esophagitis: Secondary | ICD-10-CM

## 2015-01-05 LAB — LIPID PANEL
Cholesterol: 136 mg/dL (ref 0–200)
HDL: 59 mg/dL (ref >39)
LDL Cholesterol: 61 mg/dL (ref 0–99)
Total CHOL/HDL Ratio: 2.3 Ratio
Triglycerides: 82 mg/dL (ref <150)
VLDL: 16 mg/dL (ref 0–40)

## 2015-01-05 LAB — CBC
HCT: 43 % (ref 39.0–52.0)
Hemoglobin: 14.8 g/dL (ref 13.0–17.0)
MCH: 30 pg (ref 26.0–34.0)
MCHC: 34.4 g/dL (ref 30.0–36.0)
MCV: 87 fL (ref 78.0–100.0)
MPV: 10.3 fL (ref 8.6–12.4)
Platelets: 237 10*3/uL (ref 150–400)
RBC: 4.94 MIL/uL (ref 4.22–5.81)
RDW: 14.6 % (ref 11.5–15.5)
WBC: 9.1 10*3/uL (ref 4.0–10.5)

## 2015-01-05 LAB — COMPREHENSIVE METABOLIC PANEL
ALT: 22 U/L (ref 0–53)
AST: 16 U/L (ref 0–37)
Albumin: 4.3 g/dL (ref 3.5–5.2)
Alkaline Phosphatase: 72 U/L (ref 39–117)
BUN: 13 mg/dL (ref 6–23)
CO2: 27 mEq/L (ref 19–32)
Calcium: 9.2 mg/dL (ref 8.4–10.5)
Chloride: 104 mEq/L (ref 96–112)
Creat: 0.8 mg/dL (ref 0.50–1.35)
Glucose, Bld: 86 mg/dL (ref 70–99)
Potassium: 4.2 mEq/L (ref 3.5–5.3)
Sodium: 142 mEq/L (ref 135–145)
Total Bilirubin: 0.6 mg/dL (ref 0.2–1.2)
Total Protein: 6.7 g/dL (ref 6.0–8.3)

## 2015-01-05 MED ORDER — TADALAFIL 20 MG PO TABS: 10.0000 mg | ORAL_TABLET | ORAL | Status: DC | PRN

## 2015-01-05 MED ORDER — METHYLPHENIDATE HCL 10 MG PO TABS: 10.0000 mg | ORAL_TABLET | Freq: Two times a day (BID) | ORAL | Status: DC | PRN

## 2015-01-05 MED ORDER — LORAZEPAM 1 MG PO TABS: 1.0000 mg | ORAL_TABLET | Freq: Two times a day (BID) | ORAL | Status: DC | PRN

## 2015-01-05 NOTE — Progress Notes (Signed)
Pre visit review using our clinic review tool, if applicable. No additional management support is needed unless otherwise documented below in the visit note. 

## 2015-01-05 NOTE — Patient Instructions (Signed)
Preventive Care for Adults A healthy lifestyle and preventive care can promote health and wellness. Preventive health guidelines for men include the following key practices:  A routine yearly physical is a good way to check with your health care provider about your health and preventative screening. It is a chance to share any concerns and updates on your health and to receive a thorough exam.  Visit your dentist for a routine exam and preventative care every 6 months. Brush your teeth twice a day and floss once a day. Good oral hygiene prevents tooth decay and gum disease.  The frequency of eye exams is based on your age, health, family medical history, use of contact lenses, and other factors. Follow your health care provider's recommendations for frequency of eye exams.  Eat a healthy diet. Foods such as vegetables, fruits, whole grains, low-fat dairy products, and lean protein foods contain the nutrients you need without too many calories. Decrease your intake of foods high in solid fats, added sugars, and salt. Eat the right amount of calories for you.Get information about a proper diet from your health care provider, if necessary.  Regular physical exercise is one of the most important things you can do for your health. Most adults should get at least 150 minutes of moderate-intensity exercise (any activity that increases your heart rate and causes you to sweat) each week. In addition, most adults need muscle-strengthening exercises on 2 or more days a week.  Maintain a healthy weight. The body mass index (BMI) is a screening tool to identify possible weight problems. It provides an estimate of body fat based on height and weight. Your health care provider can find your BMI and can help you achieve or maintain a healthy weight.For adults 20 years and older:  A BMI below 18.5 is considered underweight.  A BMI of 18.5 to 24.9 is normal.  A BMI of 25 to 29.9 is considered overweight.  A BMI  of 30 and above is considered obese.  Maintain normal blood lipids and cholesterol levels by exercising and minimizing your intake of saturated fat. Eat a balanced diet with plenty of fruit and vegetables. Blood tests for lipids and cholesterol should begin at age 50 and be repeated every 5 years. If your lipid or cholesterol levels are high, you are over 50, or you are at high risk for heart disease, you may need your cholesterol levels checked more frequently.Ongoing high lipid and cholesterol levels should be treated with medicines if diet and exercise are not working.  If you smoke, find out from your health care provider how to quit. If you do not use tobacco, do not start.  Lung cancer screening is recommended for adults aged 73-80 years who are at high risk for developing lung cancer because of a history of smoking. A yearly low-dose CT scan of the lungs is recommended for people who have at least a 30-pack-year history of smoking and are a current smoker or have quit within the past 15 years. A pack year of smoking is smoking an average of 1 pack of cigarettes a day for 1 year (for example: 1 pack a day for 30 years or 2 packs a day for 15 years). Yearly screening should continue until the smoker has stopped smoking for at least 15 years. Yearly screening should be stopped for people who develop a health problem that would prevent them from having lung cancer treatment.  If you choose to drink alcohol, do not have more than  2 drinks per day. One drink is considered to be 12 ounces (355 mL) of beer, 5 ounces (148 mL) of wine, or 1.5 ounces (44 mL) of liquor.  Avoid use of street drugs. Do not share needles with anyone. Ask for help if you need support or instructions about stopping the use of drugs.  High blood pressure causes heart disease and increases the risk of stroke. Your blood pressure should be checked at least every 1-2 years. Ongoing high blood pressure should be treated with  medicines, if weight loss and exercise are not effective.  If you are 45-79 years old, ask your health care provider if you should take aspirin to prevent heart disease.  Diabetes screening involves taking a blood sample to check your fasting blood sugar level. This should be done once every 3 years, after age 45, if you are within normal weight and without risk factors for diabetes. Testing should be considered at a younger age or be carried out more frequently if you are overweight and have at least 1 risk factor for diabetes.  Colorectal cancer can be detected and often prevented. Most routine colorectal cancer screening begins at the age of 50 and continues through age 75. However, your health care provider may recommend screening at an earlier age if you have risk factors for colon cancer. On a yearly basis, your health care provider may provide home test kits to check for hidden blood in the stool. Use of a small camera at the end of a tube to directly examine the colon (sigmoidoscopy or colonoscopy) can detect the earliest forms of colorectal cancer. Talk to your health care provider about this at age 50, when routine screening begins. Direct exam of the colon should be repeated every 5-10 years through age 75, unless early forms of precancerous polyps or small growths are found.  People who are at an increased risk for hepatitis B should be screened for this virus. You are considered at high risk for hepatitis B if:  You were born in a country where hepatitis B occurs often. Talk with your health care provider about which countries are considered high risk.  Your parents were born in a high-risk country and you have not received a shot to protect against hepatitis B (hepatitis B vaccine).  You have HIV or AIDS.  You use needles to inject street drugs.  You live with, or have sex with, someone who has hepatitis B.  You are a man who has sex with other men (MSM).  You get hemodialysis  treatment.  You take certain medicines for conditions such as cancer, organ transplantation, and autoimmune conditions.  Hepatitis C blood testing is recommended for all people born from 1945 through 1965 and any individual with known risks for hepatitis C.  Practice safe sex. Use condoms and avoid high-risk sexual practices to reduce the spread of sexually transmitted infections (STIs). STIs include gonorrhea, chlamydia, syphilis, trichomonas, herpes, HPV, and human immunodeficiency virus (HIV). Herpes, HIV, and HPV are viral illnesses that have no cure. They can result in disability, cancer, and death.  If you are at risk of being infected with HIV, it is recommended that you take a prescription medicine daily to prevent HIV infection. This is called preexposure prophylaxis (PrEP). You are considered at risk if:  You are a man who has sex with other men (MSM) and have other risk factors.  You are a heterosexual man, are sexually active, and are at increased risk for HIV infection.    You take drugs by injection.  You are sexually active with a partner who has HIV.  Talk with your health care provider about whether you are at high risk of being infected with HIV. If you choose to begin PrEP, you should first be tested for HIV. You should then be tested every 3 months for as long as you are taking PrEP.  A one-time screening for abdominal aortic aneurysm (AAA) and surgical repair of large AAAs by ultrasound are recommended for men ages 32 to 67 years who are current or former smokers.  Healthy men should no longer receive prostate-specific antigen (PSA) blood tests as part of routine cancer screening. Talk with your health care provider about prostate cancer screening.  Testicular cancer screening is not recommended for adult males who have no symptoms. Screening includes self-exam, a health care provider exam, and other screening tests. Consult with your health care provider about any symptoms  you have or any concerns you have about testicular cancer.  Use sunscreen. Apply sunscreen liberally and repeatedly throughout the day. You should seek shade when your shadow is shorter than you. Protect yourself by wearing long sleeves, pants, a wide-brimmed hat, and sunglasses year round, whenever you are outdoors.  Once a month, do a whole-body skin exam, using a mirror to look at the skin on your back. Tell your health care provider about new moles, moles that have irregular borders, moles that are larger than a pencil eraser, or moles that have changed in shape or color.  Stay current with required vaccines (immunizations).  Influenza vaccine. All adults should be immunized every year.  Tetanus, diphtheria, and acellular pertussis (Td, Tdap) vaccine. An adult who has not previously received Tdap or who does not know his vaccine status should receive 1 dose of Tdap. This initial dose should be followed by tetanus and diphtheria toxoids (Td) booster doses every 10 years. Adults with an unknown or incomplete history of completing a 3-dose immunization series with Td-containing vaccines should begin or complete a primary immunization series including a Tdap dose. Adults should receive a Td booster every 10 years.  Varicella vaccine. An adult without evidence of immunity to varicella should receive 2 doses or a second dose if he has previously received 1 dose.  Human papillomavirus (HPV) vaccine. Males aged 68-21 years who have not received the vaccine previously should receive the 3-dose series. Males aged 22-26 years may be immunized. Immunization is recommended through the age of 6 years for any male who has sex with males and did not get any or all doses earlier. Immunization is recommended for any person with an immunocompromised condition through the age of 49 years if he did not get any or all doses earlier. During the 3-dose series, the second dose should be obtained 4-8 weeks after the first  dose. The third dose should be obtained 24 weeks after the first dose and 16 weeks after the second dose.  Zoster vaccine. One dose is recommended for adults aged 50 years or older unless certain conditions are present.  Measles, mumps, and rubella (MMR) vaccine. Adults born before [birth year] generally are considered immune to measles and mumps. Adults born in [birth year] or later should have 1 or more doses of MMR vaccine unless there is a contraindication to the vaccine or there is laboratory evidence of immunity to each of the three diseases. A routine second dose of MMR vaccine should be obtained at least 28 days after the first dose for students attending postsecondary  schools, health care workers, or international travelers. People who received inactivated measles vaccine or an unknown type of measles vaccine during 1963-1967 should receive 2 doses of MMR vaccine. People who received inactivated mumps vaccine or an unknown type of mumps vaccine before 1979 and are at high risk for mumps infection should consider immunization with 2 doses of MMR vaccine. Unvaccinated health care workers born before 1957 who lack laboratory evidence of measles, mumps, or rubella immunity or laboratory confirmation of disease should consider measles and mumps immunization with 2 doses of MMR vaccine or rubella immunization with 1 dose of MMR vaccine.  Pneumococcal 13-valent conjugate (PCV13) vaccine. When indicated, a person who is uncertain of his immunization history and has no record of immunization should receive the PCV13 vaccine. An adult aged 19 years or older who has certain medical conditions and has not been previously immunized should receive 1 dose of PCV13 vaccine. This PCV13 should be followed with a dose of pneumococcal polysaccharide (PPSV23) vaccine. The PPSV23 vaccine dose should be obtained at least 8 weeks after the dose of PCV13 vaccine. An adult aged 19 years or older who has certain medical conditions and  previously received 1 or more doses of PPSV23 vaccine should receive 1 dose of PCV13. The PCV13 vaccine dose should be obtained 1 or more years after the last PPSV23 vaccine dose.  Pneumococcal polysaccharide (PPSV23) vaccine. When PCV13 is also indicated, PCV13 should be obtained first. All adults aged 65 years and older should be immunized. An adult younger than age 65 years who has certain medical conditions should be immunized. Any person who resides in a nursing home or long-term care facility should be immunized. An adult smoker should be immunized. People with an immunocompromised condition and certain other conditions should receive both PCV13 and PPSV23 vaccines. People with human immunodeficiency virus (HIV) infection should be immunized as soon as possible after diagnosis. Immunization during chemotherapy or radiation therapy should be avoided. Routine use of PPSV23 vaccine is not recommended for American Indians, [place] Natives, or people younger than 65 years unless there are medical conditions that require PPSV23 vaccine. When indicated, people who have unknown immunization and have no record of immunization should receive PPSV23 vaccine. One-time revaccination 5 years after the first dose of PPSV23 is recommended for people aged 19-64 years who have chronic kidney failure, nephrotic syndrome, asplenia, or immunocompromised conditions. People who received 1-2 doses of PPSV23 before age 65 years should receive another dose of PPSV23 vaccine at age 65 years or later if at least 5 years have passed since the previous dose. Doses of PPSV23 are not needed for people immunized with PPSV23 at or after age 65 years.  Meningococcal vaccine. Adults with asplenia or persistent complement component deficiencies should receive 2 doses of quadrivalent meningococcal conjugate (MenACWY-D) vaccine. The doses should be obtained at least 2 months apart. Microbiologists working with certain meningococcal bacteria,  military recruits, people at risk during an outbreak, and people who travel to or live in countries with a high rate of meningitis should be immunized. A first-year college student up through age 21 years who is living in a residence hall should receive a dose if he did not receive a dose on or after his 16th birthday. Adults who have certain high-risk conditions should receive one or more doses of vaccine.  Hepatitis A vaccine. Adults who wish to be protected from this disease, have certain high-risk conditions, work with hepatitis A-infected animals, work in hepatitis A research labs, or   travel to or work in countries with a high rate of hepatitis A should be immunized. Adults who were previously unvaccinated and who anticipate close contact with an international adoptee during the first 60 days after arrival in the [place] from a country with a high rate of hepatitis A should be immunized.  Hepatitis B vaccine. Adults should be immunized if they wish to be protected from this disease, have certain high-risk conditions, may be exposed to blood or other infectious body fluids, are household contacts or sex partners of hepatitis B positive people, are clients or workers in certain care facilities, or travel to or work in countries with a high rate of hepatitis B.  Haemophilus influenzae type b (Hib) vaccine. A previously unvaccinated person with asplenia or sickle cell disease or having a scheduled splenectomy should receive 1 dose of Hib vaccine. Regardless of previous immunization, a recipient of a hematopoietic stem cell transplant should receive a 3-dose series 6-12 months after his successful transplant. Hib vaccine is not recommended for adults with HIV infection. Preventive Service / Frequency Ages 52 to 17  Blood pressure check.** / Every 1 to 2 years.  Lipid and cholesterol check.** / Every 5 years beginning at age 69.  Hepatitis C blood test.** / For any individual with known risks for  hepatitis C.  Skin self-exam. / Monthly.  Influenza vaccine. / Every year.  Tetanus, diphtheria, and acellular pertussis (Tdap, Td) vaccine.** / Consult your health care provider. 1 dose of Td every 10 years.  Varicella vaccine.** / Consult your health care provider.  HPV vaccine. / 3 doses over 6 months, if 72 or younger.  Measles, mumps, rubella (MMR) vaccine.** / You need at least 1 dose of MMR if you were born in 1957 or later. You may also need a second dose.  Pneumococcal 13-valent conjugate (PCV13) vaccine.** / Consult your health care provider.  Pneumococcal polysaccharide (PPSV23) vaccine.** / 1 to 2 doses if you smoke cigarettes or if you have certain conditions.  Meningococcal vaccine.** / 1 dose if you are age 35 to 60 years and a Market researcher living in a residence hall, or have one of several medical conditions. You may also need additional booster doses.  Hepatitis A vaccine.** / Consult your health care provider.  Hepatitis B vaccine.** / Consult your health care provider.  Haemophilus influenzae type b (Hib) vaccine.** / Consult your health care provider. Ages 35 to 8  Blood pressure check.** / Every 1 to 2 years.  Lipid and cholesterol check.** / Every 5 years beginning at age 57.  Lung cancer screening. / Every year if you are aged 44-80 years and have a 30-pack-year history of smoking and currently smoke or have quit within the past 15 years. Yearly screening is stopped once you have quit smoking for at least 15 years or develop a health problem that would prevent you from having lung cancer treatment.  Fecal occult blood test (FOBT) of stool. / Every year beginning at age 55 and continuing until age 73. You may not have to do this test if you get a colonoscopy every 10 years.  Flexible sigmoidoscopy** or colonoscopy.** / Every 5 years for a flexible sigmoidoscopy or every 10 years for a colonoscopy beginning at age 28 and continuing until age  1.  Hepatitis C blood test.** / For all people born from [birth year] through 1965 and any individual with known risks for hepatitis C.  Skin self-exam. / Monthly.  Influenza vaccine. / Every  year.  Tetanus, diphtheria, and acellular pertussis (Tdap/Td) vaccine.** / Consult your health care provider. 1 dose of Td every 10 years.  Varicella vaccine.** / Consult your health care provider.  Zoster vaccine.** / 1 dose for adults aged 53 years or older.  Measles, mumps, rubella (MMR) vaccine.** / You need at least 1 dose of MMR if you were born in 1957 or later. You may also need a second dose.  Pneumococcal 13-valent conjugate (PCV13) vaccine.** / Consult your health care provider.  Pneumococcal polysaccharide (PPSV23) vaccine.** / 1 to 2 doses if you smoke cigarettes or if you have certain conditions.  Meningococcal vaccine.** / Consult your health care provider.  Hepatitis A vaccine.** / Consult your health care provider.  Hepatitis B vaccine.** / Consult your health care provider.  Haemophilus influenzae type b (Hib) vaccine.** / Consult your health care provider. Ages 77 and over  Blood pressure check.** / Every 1 to 2 years.  Lipid and cholesterol check.**/ Every 5 years beginning at age 85.  Lung cancer screening. / Every year if you are aged 55-80 years and have a 30-pack-year history of smoking and currently smoke or have quit within the past 15 years. Yearly screening is stopped once you have quit smoking for at least 15 years or develop a health problem that would prevent you from having lung cancer treatment.  Fecal occult blood test (FOBT) of stool. / Every year beginning at age 33 and continuing until age 11. You may not have to do this test if you get a colonoscopy every 10 years.  Flexible sigmoidoscopy** or colonoscopy.** / Every 5 years for a flexible sigmoidoscopy or every 10 years for a colonoscopy beginning at age 28 and continuing until age 73.  Hepatitis C blood  test.** / For all people born from [birth year] through 1965 and any individual with known risks for hepatitis C.  Abdominal aortic aneurysm (AAA) screening.** / A one-time screening for ages 50 to 27 years who are current or former smokers.  Skin self-exam. / Monthly.  Influenza vaccine. / Every year.  Tetanus, diphtheria, and acellular pertussis (Tdap/Td) vaccine.** / 1 dose of Td every 10 years.  Varicella vaccine.** / Consult your health care provider.  Zoster vaccine.** / 1 dose for adults aged 34 years or older.  Pneumococcal 13-valent conjugate (PCV13) vaccine.** / Consult your health care provider.  Pneumococcal polysaccharide (PPSV23) vaccine.** / 1 dose for all adults aged 63 years and older.  Meningococcal vaccine.** / Consult your health care provider.  Hepatitis A vaccine.** / Consult your health care provider.  Hepatitis B vaccine.** / Consult your health care provider.  Haemophilus influenzae type b (Hib) vaccine.** / Consult your health care provider. **Family history and personal history of risk and conditions may change your health care provider's recommendations. Document Released: 01/20/2002 Document Revised: 11/29/2013 Document Reviewed: 04/21/2011 [facility] Patient Information 2015 [place], [place]. This information is not intended to replace advice given to you by your health care provider. Make sure you discuss any questions you have with your health care provider.

## 2015-01-06 LAB — TSH: TSH: 0.081 u[IU]/mL — ABNORMAL LOW (ref 0.350–4.500)

## 2015-01-14 NOTE — Assessment & Plan Note (Signed)
Well controlled, no changes to meds. Encouraged heart healthy diet such as the DASH diet and exercise as tolerated.  °

## 2015-01-14 NOTE — Assessment & Plan Note (Signed)
Managing on current meds but has had some trouble lately, agrees to start some counseling offered referral information

## 2015-01-14 NOTE — Assessment & Plan Note (Signed)
Encouraged DASH diet, decrease po intake and increase exercise as tolerated. Needs 7-8 hours of sleep nightly. Avoid trans fats, eat small, frequent meals every 4-5 hours with lean proteins, complex carbs andhealthy fats. Minimize simple carbs. Consider Belviq

## 2015-01-14 NOTE — Progress Notes (Signed)
Keith Morrow  [id number] June 20, 1971 01/14/2015      Progress Note-Follow Up  Subjective  Chief Complaint  Chief Complaint  Patient presents with  . Annual Exam    HPI  Patient is a 44 y.o. male in today for routine medical care. Patient in for annual exam. Has recently started a course of physical therapy due to his increasing weakness from his multiple sclerosis. He believes that physical therapy is helpful and he is feeling stronger. He is frustrated with some swelling in his ankles lately although he does know with compression hose that has improved. No other recent illness. He acknowledges some anhedonia and grief response secondary to his worsening illness. No suicidal ideation or homicidal ideation. Denies CP/palp/SOB/HA/congestion/fevers/GI or GU c/o. Taking meds as prescribed  Past Medical History  Diagnosis Date  . HTN (hypertension)   . Hypothyroidism   . Hyperlipidemia   . MS (multiple sclerosis)   . Coronary atherosclerosis of native coronary artery     a. NSTEMI 04/20/11: tx with Promus DES to LAD; bifurcation lesion with oDx 90% tx with POBA; b. staged PCI of right post. AV Branch with Promus DES; c. residual at cath 04/21/11:  AV groove CFX 50%, mRCA 30%,; EF 55%  . Chicken pox as a child  . Heart attack 04-20-11  . Allergy   . Obesity   . Depression   . Onychomycosis   . Tinea pedis   . Keloid scar   . Preventative health care 10/07/2011  . Reflux 10/07/2011  . 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 Hypotestosteronism 11/17/2011  . Dermatitis, contact 12/10/2011  . Bronchitis, acute 12/10/2011  . Testosterone deficiency 01/16/2012  . Fatigue 10/15/2012  . Acute bronchitis 05/09/2013  . Neuromuscular disorder     NUMBNESS/TINGLING    Past Surgical History  Procedure Laterality Date  . Right wrist surgery      CTR  . Vasectomy    . Vascular surgery      vericose vein in right femoral area  . Wisdom tooth extraction    . Coronary angioplasty with  stent placement      2012  . Pain pump implantation N/A 08/29/2014    Procedure: Baclofen pump placement;  Surgeon: Maeola Harman, MD;  Location: [facility];  Service: Neurosurgery;  Laterality: N/A;  Baclofen pump placement  . Programable baclofen pump revision  08/29/14    Family History  Problem Relation Age of Onset  . Coronary artery disease      questionable in father  . Heart attack Mother 62  . Thyroid disease Mother   . Heart disease Mother   . Thyroid disease Sister   . Thyroid disease Brother   . Heart disease Maternal Grandmother   . Hypertension Maternal Grandmother   . Hyperlipidemia Maternal Grandmother   . Heart disease Maternal Grandfather   . Hypertension Maternal Grandfather   . Hyperlipidemia Maternal Grandfather   . Stroke Paternal Grandfather   . Thyroid disease Sister   . Heart disease Father     2 stents    History   Social History  . Marital Status: Married    Spouse Name: N/A    Number of Children: 3  . Years of Education: N/A   Occupational History  . Not on file.   Social History Main Topics  . Smoking status: Never Smoker   . Smokeless tobacco: Never Used  . Alcohol Use: Yes     Comment: socially  .  Drug Use: No  . 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 Not on file   Social History Narrative    Current Outpatient Prescriptions on File Prior to Visit  Medication Sig Dispense Refill  . aspirin EC 81 MG tablet Take 81 mg by mouth daily.    Marland Kitchen atorvastatin (LIPITOR) 40 MG tablet Take 1 tablet (40 mg total) by mouth daily. 90 tablet 3  . atorvastatin (LIPITOR) 40 MG tablet TAKE 1 TABLET (40 MG TOTAL) BY MOUTH DAILY. 30 tablet 11  . BENICAR 20 MG tablet TAKE 1 TABLET BY MOUTH EVERY DAY 90 tablet 2  . cetirizine (ZYRTEC) 10 MG tablet Take 10 mg by mouth daily as needed for allergies.     . Dalfampridine (AMPYRA) 10 MG  TB12 Take 10 mg by mouth 2 (two) times daily. Every 12 hours    . DULoxetine (CYMBALTA) 60 MG capsule Take 60 mg by mouth 2 (two) times daily.    Marland Kitchen levothyroxine (SYNTHROID, LEVOTHROID) 150 MCG tablet Take 150 mcg by mouth daily before breakfast.     . nitroGLYCERIN (NITROSTAT) 0.4 MG SL tablet Place 0.4 mg under the tongue every 5 (five) minutes as needed for chest pain.    Marland Kitchen olmesartan (BENICAR) 20 MG tablet Take 20 mg by mouth daily.    Marland Kitchen testosterone cypionate (DEPOTESTOTERONE CYPIONATE) 200 MG/ML injection Inject 1 mL (200 mg total) into the muscle See admin instructions. Every week for 3 weeks, then skip a week and restart again 10 mL 0  . baclofen (LIORESAL) 20 MG tablet Take 20 mg by mouth 4 (four) times daily.    Marland Kitchen HYDROcodone-acetaminophen (NORCO/VICODIN) 5-325 MG per tablet Take 1-2 tablets by mouth every 4 (four) hours as needed (mild pain). (Patient not taking: Reported on 01/05/2015) 60 tablet 0   No current facility-administered medications on file prior to visit.    No Known Allergies  Review of Systems  Review of Systems  Constitutional: Negative for fever and chills.  HENT: Negative for congestion, hearing loss and nosebleeds.   Eyes: Negative for discharge.  Respiratory: Negative for cough, sputum production, shortness of breath and wheezing.   Cardiovascular: Negative for chest pain, palpitations and leg swelling.  Gastrointestinal: Negative for heartburn, nausea, vomiting, abdominal pain, diarrhea, constipation and blood in stool.  Genitourinary: Negative for dysuria, urgency, frequency and hematuria.  Musculoskeletal: Positive for myalgias. Negative for back pain and falls.  Skin: Positive for rash.  Neurological: Positive for focal weakness. Negative for dizziness, tremors, sensory change, loss of consciousness, weakness and headaches.  Endo/Heme/Allergies: Negative for polydipsia. Does not bruise/bleed easily.  Psychiatric/Behavioral: Positive for depression.  Negative for suicidal ideas. The patient is nervous/anxious. The patient does not have insomnia.     Objective  BP 110/62 mmHg  Pulse 95  Temp(Src) 97.7 F (36.5 C) (Oral)  Resp 16  Wt 280 lb 9.6 oz (127.279 kg)  SpO2 97%  Physical Exam  Physical Exam  Constitutional: He is oriented to person, place, and time and well-developed, well-nourished, and in no distress. No distress.  HENT:  Head: Normocephalic and atraumatic.  Eyes: Conjunctivae are normal.  Neck: Neck supple. No thyromegaly present.  Cardiovascular: Normal rate, regular rhythm and normal heart sounds.   No murmur heard. Pulmonary/Chest: Effort normal and breath sounds normal. No respiratory distress.  Abdominal: He exhibits no distension and no mass. There  is no tenderness.  Musculoskeletal: He exhibits no edema.  Neurological: He is alert and oriented to person, place, and time.  Unsteady gait  Skin: Skin is warm.  Psychiatric: Memory, affect and judgment normal.    Lab Results  Component Value Date   TSH 0.081* 01/05/2015   Lab Results  Component Value Date   WBC 9.1 01/05/2015   HGB 14.8 01/05/2015   HCT 43.0 01/05/2015   MCV 87.0 01/05/2015   PLT 237 01/05/2015   Lab Results  Component Value Date   CREATININE 0.80 01/05/2015   BUN 13 01/05/2015   NA 142 01/05/2015   K 4.2 01/05/2015   CL 104 01/05/2015   CO2 27 01/05/2015   Lab Results  Component Value Date   ALT 22 01/05/2015   AST 16 01/05/2015   ALKPHOS 72 01/05/2015   BILITOT 0.6 01/05/2015   Lab Results  Component Value Date   CHOL 136 01/05/2015   Lab Results  Component Value Date   HDL 59 01/05/2015   Lab Results  Component Value Date   LDLCALC 61 01/05/2015   Lab Results  Component Value Date   TRIG 82 01/05/2015   Lab Results  Component Value Date   CHOLHDL 2.3 01/05/2015     Assessment & Plan  HTN (hypertension) Well controlled, no changes to meds. Encouraged heart healthy diet such as the DASH diet and  exercise as tolerated.    Depression Managing on current meds but has had some trouble lately, agrees to start some counseling offered referral information   Hyperlipidemia Tolerating statin, encouraged heart healthy diet, avoid trans fats, minimize simple carbs and saturated fats. Increase exercise as tolerated   Reflux Avoid offending foods, start probiotics. Do not eat large meals in late evening and consider raising head of bed.    Obesity Encouraged DASH diet, decrease po intake and increase exercise as tolerated. Needs 7-8 hours of sleep nightly. Avoid trans fats, eat small, frequent meals every 4-5 hours with lean proteins, complex carbs andhealthy fats. Minimize simple carbs. Consider Belviq   Preventative health care Patient encouraged to maintain heart healthy diet, regular exercise, adequate sleep. Consider daily probiotics. Take medications as prescribed   MS (multiple sclerosis) No change in meds but has started a course of PT lately and finds that helpful

## 2015-01-14 NOTE — Assessment & Plan Note (Signed)
Tolerating statin, encouraged heart healthy diet, avoid trans fats, minimize simple carbs and saturated fats. Increase exercise as tolerated 

## 2015-01-14 NOTE — Assessment & Plan Note (Signed)
No change in meds but has started a course of PT lately and finds that helpful

## 2015-01-14 NOTE — Assessment & Plan Note (Signed)
Patient encouraged to maintain heart healthy diet, regular exercise, adequate sleep. Consider daily probiotics. Take medications as prescribed 

## 2015-01-14 NOTE — Assessment & Plan Note (Signed)
Avoid offending foods, start probiotics. Do not eat large meals in late evening and consider raising head of bed.  

## 2015-01-19 ENCOUNTER — Encounter: Payer: Self-pay | Admitting: Family Medicine

## 2015-01-19 ENCOUNTER — Ambulatory Visit (INDEPENDENT_AMBULATORY_CARE_PROVIDER_SITE_OTHER): Payer: Managed Care, Other (non HMO) | Admitting: Psychology

## 2015-01-19 DIAGNOSIS — F4323 Adjustment disorder with mixed anxiety and depressed mood: Secondary | ICD-10-CM

## 2015-01-26 ENCOUNTER — Ambulatory Visit (INDEPENDENT_AMBULATORY_CARE_PROVIDER_SITE_OTHER): Payer: Managed Care, Other (non HMO) | Admitting: Psychology

## 2015-01-26 DIAGNOSIS — F4323 Adjustment disorder with mixed anxiety and depressed mood: Secondary | ICD-10-CM

## 2015-02-02 ENCOUNTER — Ambulatory Visit (INDEPENDENT_AMBULATORY_CARE_PROVIDER_SITE_OTHER): Payer: Managed Care, Other (non HMO) | Admitting: Psychology

## 2015-02-02 DIAGNOSIS — F4323 Adjustment disorder with mixed anxiety and depressed mood: Secondary | ICD-10-CM

## 2015-02-16 ENCOUNTER — Ambulatory Visit (INDEPENDENT_AMBULATORY_CARE_PROVIDER_SITE_OTHER): Payer: Managed Care, Other (non HMO) | Admitting: Psychology

## 2015-02-16 DIAGNOSIS — F4323 Adjustment disorder with mixed anxiety and depressed mood: Secondary | ICD-10-CM

## 2015-03-09 ENCOUNTER — Ambulatory Visit: Payer: Managed Care, Other (non HMO) | Admitting: Psychology

## 2015-03-13 ENCOUNTER — Encounter: Payer: Managed Care, Other (non HMO) | Admitting: Family Medicine

## 2015-03-13 ENCOUNTER — Telehealth: Payer: Self-pay

## 2015-03-13 NOTE — Telephone Encounter (Signed)
Pt's wife came into office for OV appt today (03/13/2015) insurance forms given to [name] for completion. Form completed, signed and mailed back to Pt as requested. Copies sent for scanning into chart.

## 2015-03-23 ENCOUNTER — Ambulatory Visit (INDEPENDENT_AMBULATORY_CARE_PROVIDER_SITE_OTHER): Payer: Managed Care, Other (non HMO) | Admitting: Psychology

## 2015-03-23 DIAGNOSIS — F4323 Adjustment disorder with mixed anxiety and depressed mood: Secondary | ICD-10-CM | POA: Diagnosis not present

## 2015-04-06 ENCOUNTER — Ambulatory Visit: Payer: Managed Care, Other (non HMO) | Admitting: Family Medicine

## 2015-04-12 ENCOUNTER — Ambulatory Visit (INDEPENDENT_AMBULATORY_CARE_PROVIDER_SITE_OTHER): Payer: Managed Care, Other (non HMO) | Admitting: Family Medicine

## 2015-04-12 ENCOUNTER — Encounter: Payer: Self-pay | Admitting: Family Medicine

## 2015-04-12 VITALS — BP 117/73 | HR 81 | Temp 98.6°F | Resp 18 | Ht 75.0 in | Wt 285.0 lb

## 2015-04-12 DIAGNOSIS — I8390 Asymptomatic varicose veins of unspecified lower extremity: Secondary | ICD-10-CM

## 2015-04-12 DIAGNOSIS — F4323 Adjustment disorder with mixed anxiety and depressed mood: Secondary | ICD-10-CM

## 2015-04-12 DIAGNOSIS — G35D Multiple sclerosis, unspecified: Secondary | ICD-10-CM

## 2015-04-12 DIAGNOSIS — I10 Essential (primary) hypertension: Secondary | ICD-10-CM

## 2015-04-12 DIAGNOSIS — B353 Tinea pedis: Secondary | ICD-10-CM | POA: Diagnosis not present

## 2015-04-12 DIAGNOSIS — E039 Hypothyroidism, unspecified: Secondary | ICD-10-CM | POA: Diagnosis not present

## 2015-04-12 DIAGNOSIS — R600 Localized edema: Secondary | ICD-10-CM | POA: Diagnosis not present

## 2015-04-12 DIAGNOSIS — E669 Obesity, unspecified: Secondary | ICD-10-CM

## 2015-04-12 DIAGNOSIS — G35 Multiple sclerosis: Secondary | ICD-10-CM

## 2015-04-12 DIAGNOSIS — F418 Other specified anxiety disorders: Secondary | ICD-10-CM

## 2015-04-12 DIAGNOSIS — E785 Hyperlipidemia, unspecified: Secondary | ICD-10-CM

## 2015-04-12 DIAGNOSIS — I868 Varicose veins of other specified sites: Secondary | ICD-10-CM

## 2015-04-12 LAB — TSH: TSH: 0.75 u[IU]/mL (ref 0.35–4.50)

## 2015-04-12 MED ORDER — OLMESARTAN MEDOXOMIL 20 MG PO TABS: 20.0000 mg | ORAL_TABLET | Freq: Every day | ORAL | Status: DC

## 2015-04-12 MED ORDER — METHYLPHENIDATE HCL 10 MG PO TABS: 10.0000 mg | ORAL_TABLET | Freq: Two times a day (BID) | ORAL | Status: DC | PRN

## 2015-04-12 MED ORDER — LORAZEPAM 1 MG PO TABS: 1.0000 mg | ORAL_TABLET | Freq: Two times a day (BID) | ORAL | Status: DC | PRN

## 2015-04-12 MED ORDER — CLOTRIMAZOLE-BETAMETHASONE 1-0.05 % EX CREA: TOPICAL_CREAM | CUTANEOUS | Status: DC

## 2015-04-12 NOTE — Patient Instructions (Addendum)
increased rest and hydration, add probiotics, zinc such as Coldeze or Xicam. Treat fevers as needed. Mucinex twice daily a probiotic, NOW company at [employer] has a 10 strain version  Athlete's Foot Athlete's foot (tinea pedis) is a fungal infection of the skin on the feet. It often occurs on the skin between the toes or underneath the toes. It can also occur on the soles of the feet. Athlete's foot is more likely to occur in hot, humid weather. Not washing your feet or changing your socks often enough can contribute to athlete's foot. The infection can spread from person to person (contagious). CAUSES Athlete's foot is caused by a fungus. This fungus thrives in warm, moist places. Most people get athlete's foot by sharing shower stalls, towels, and wet floors with an infected person. People with weakened immune systems, including those with diabetes, may be more likely to get athlete's foot. SYMPTOMS   Itchy areas between the toes or on the soles of the feet.  White, flaky, or scaly areas between the toes or on the soles of the feet.  Tiny, intensely itchy blisters between the toes or on the soles of the feet.  Tiny cuts on the skin. These cuts can develop a bacterial infection.  Thick or discolored toenails. DIAGNOSIS  Your caregiver can usually tell what the problem is by doing a physical exam. Your caregiver may also take a skin sample from the rash area. The skin sample may be examined under a microscope, or it may be tested to see if fungus will grow in the sample. A sample may also be taken from your toenail for testing. TREATMENT  Over-the-counter and prescription medicines can be used to kill the fungus. These medicines are available as powders or creams. Your caregiver can suggest medicines for you. Fungal infections respond slowly to treatment. You may need to continue using your medicine for several weeks. PREVENTION   Do not share towels.  Wear sandals in wet areas,  such as shared locker rooms and shared showers.  Keep your feet dry. Wear shoes that allow air to circulate. Wear cotton or wool socks. HOME CARE INSTRUCTIONS   Take medicines as directed by your caregiver. Do not use steroid creams on athlete's foot.  Keep your feet clean and cool. Wash your feet daily and dry them thoroughly, especially between your toes.  Change your socks every day. Wear cotton or wool socks. In hot climates, you may need to change your socks 2 to 3 times per day.  Wear sandals or canvas tennis shoes with good air circulation.  If you have blisters, soak your feet in Burow's solution or Epsom salts for 20 to 30 minutes, 2 times a day to dry out the blisters. Make sure you dry your feet thoroughly afterward. SEEK MEDICAL CARE IF:   You have a fever.  You have swelling, soreness, warmth, or redness in your foot.  You are not getting better after 7 days of treatment.  You are not completely cured after 30 days.  You have any problems caused by your medicines. MAKE SURE YOU:   Understand these instructions.  Will watch your condition.  Will get help right away if you are not doing well or get worse. Document Released: 11/21/2000 Document Revised: 02/16/2012 Document Reviewed: 09/12/2011 [facility] Patient Information 2015 [place], [place]. This information is not intended to replace advice given to you by your health care provider. Make sure you discuss any questions you have with your health care provider.

## 2015-04-12 NOTE — Progress Notes (Signed)
Pre visit review using our clinic review tool, if applicable. No additional management support is needed unless otherwise documented below in the visit note. 

## 2015-04-13 ENCOUNTER — Ambulatory Visit (INDEPENDENT_AMBULATORY_CARE_PROVIDER_SITE_OTHER): Payer: Managed Care, Other (non HMO) | Admitting: Psychology

## 2015-04-13 DIAGNOSIS — F4323 Adjustment disorder with mixed anxiety and depressed mood: Secondary | ICD-10-CM | POA: Diagnosis not present

## 2015-04-28 ENCOUNTER — Encounter: Payer: Self-pay | Admitting: Family Medicine

## 2015-04-28 DIAGNOSIS — R600 Localized edema: Secondary | ICD-10-CM | POA: Insufficient documentation

## 2015-04-28 NOTE — Assessment & Plan Note (Signed)
Has referred to counselor to help with grief counseling, continue current meds for now.

## 2015-04-28 NOTE — Assessment & Plan Note (Signed)
Tolerating statin, encouraged heart healthy diet, avoid trans fats, minimize simple carbs and saturated fats. Increase exercise as tolerated 

## 2015-04-28 NOTE — Assessment & Plan Note (Signed)
Continues to work but struggles more and more with mobility and stability, using more assistive devices for mobility

## 2015-04-28 NOTE — Assessment & Plan Note (Signed)
Encouraged compression hose. Ultrasound of lower extremity due to increased edema. Negative for DVT, referred to vascular surgery for further consideration

## 2015-04-28 NOTE — Assessment & Plan Note (Signed)
Avoid offending foods, start probiotics. Do not eat large meals in late evening and consider raising head of bed.  

## 2015-04-28 NOTE — Assessment & Plan Note (Signed)
On Levothyroxine, continue to monitor. TSH wnl today

## 2015-04-28 NOTE — Progress Notes (Signed)
Keith Morrow Convey  [id number] [birth year] 04/28/2015      Progress Note-Follow Up  Subjective  Chief Complaint  Chief Complaint  Patient presents with  . Follow-up    Still worried about his right foot blood pooling    HPI  Patient is a 44 y.o. male in today for routine medical care. Patient is in today for follow-up. He continues to struggle with worsening stability and weakness. Is frequently using 2 assistive devices to help him with basic mobility at this time. He acknowledges increased depression, anxiety due to his worsening weakness. He is also noting increased edema in bilateral feet, worse in the right. Some increased discomfort as a result. No recent illness. Denies CP/palp/SOB/HA/congestion/fevers/GI or GU c/o. Taking meds as prescribed  Past Medical History  Diagnosis Date  . HTN (hypertension)   . Hypothyroidism   . Hyperlipidemia   . MS (multiple sclerosis)   . Coronary atherosclerosis of native coronary artery     a. NSTEMI 04/20/11: tx with Promus DES to LAD; bifurcation lesion with oDx 90% tx with POBA; b. staged PCI of right post. AV Branch with Promus DES; c. residual at cath 04/21/11:  AV groove CFX 50%, mRCA 30%,; EF 55%  . Chicken pox as a child  . Heart attack 04-20-11  . Allergy   . Obesity   . Depression   . Onychomycosis   . Tinea pedis   . Keloid scar   . Preventative health care 10/07/2011  . Reflux 10/07/2011  . 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 Hypotestosteronism 11/17/2011  . Dermatitis, contact 12/10/2011  . Bronchitis, acute 12/10/2011  . Testosterone deficiency 01/16/2012  . Fatigue 10/15/2012  . Acute bronchitis 05/09/2013  . Neuromuscular disorder     NUMBNESS/TINGLING  . Depression with anxiety     Past Surgical History  Procedure Laterality Date  . Right wrist surgery      CTR  . Vasectomy    . Vascular surgery      vericose vein in right femoral area  . Wisdom tooth extraction    . Coronary angioplasty with stent placement       2012  . Pain pump implantation N/A 08/29/2014    Procedure: Baclofen pump placement;  Surgeon: Maeola Harman, MD;  Location: [facility];  Service: Neurosurgery;  Laterality: N/A;  Baclofen pump placement  . Programable baclofen pump revision  08/29/14    Family History  Problem Relation Age of Onset  . Coronary artery disease      questionable in father  . Heart attack Mother 1  . Thyroid disease Mother   . Heart disease Mother   . Thyroid disease Sister   . Thyroid disease Brother   . Heart disease Maternal Grandmother   . Hypertension Maternal Grandmother   . Hyperlipidemia Maternal Grandmother   . Heart disease Maternal Grandfather   . Hypertension Maternal Grandfather   . Hyperlipidemia Maternal Grandfather   . Stroke Paternal Grandfather   . Thyroid disease Sister   . Heart disease Father     2 stents    History   Social History  . Marital Status: Married    Spouse Name: N/A  . Number of Children: 3  . Years of Education: N/A   Occupational History  . Not on file.   Social History Main Topics  . Smoking status: Never Smoker   . Smokeless tobacco: Never Used  . Alcohol Use: Yes     Comment: socially  .  Drug Use: No  . 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 Not on file   Social History Narrative    Current Outpatient Prescriptions on File Prior to Visit  Medication Sig Dispense Refill  . aspirin EC 81 MG tablet Take 81 mg by mouth daily.    Marland Kitchen atorvastatin (LIPITOR) 40 MG tablet Take 1 tablet (40 mg total) by mouth daily. 90 tablet 3  . cetirizine (ZYRTEC) 10 MG tablet Take 10 mg by mouth daily as needed for allergies.     . Dalfampridine (AMPYRA) 10 MG TB12 Take 10 mg by mouth 2 (two) times daily. Every 12 hours    . DULoxetine (CYMBALTA) 60 MG capsule Take 60 mg by mouth 2 (two) times daily.    Marland Kitchen levothyroxine (SYNTHROID, LEVOTHROID)  150 MCG tablet Take 150 mcg by mouth daily before breakfast.     . nitroGLYCERIN (NITROSTAT) 0.4 MG SL tablet Place 0.4 mg under the tongue every 5 (five) minutes as needed for chest pain.    Marland Kitchen olmesartan (BENICAR) 20 MG tablet Take 20 mg by mouth daily.    . tadalafil (CIALIS) 20 MG tablet Take 0.5-1 tablets (10-20 mg total) by mouth every other day as needed for erectile dysfunction. 5 tablet 1  . testosterone cypionate (DEPOTESTOTERONE CYPIONATE) 200 MG/ML injection Inject 1 mL (200 mg total) into the muscle See admin instructions. Every week for 3 weeks, then skip a week and restart again 10 mL 0   No current facility-administered medications on file prior to visit.    No Known Allergies  Review of Systems  Review of Systems  Constitutional: Positive for malaise/fatigue. Negative for fever.  HENT: Negative for congestion.   Eyes: Negative for discharge.  Respiratory: Negative for shortness of breath.   Cardiovascular: Negative for chest pain, palpitations and leg swelling.  Gastrointestinal: Negative for nausea, abdominal pain and diarrhea.  Genitourinary: Negative for dysuria.  Musculoskeletal: Positive for back pain, joint pain and falls. Negative for myalgias.  Skin: Negative for rash.  Neurological: Negative for loss of consciousness and headaches.  Endo/Heme/Allergies: Negative for polydipsia.  Psychiatric/Behavioral: Positive for depression. Negative for suicidal ideas. The patient is nervous/anxious. The patient does not have insomnia.     Objective  BP 117/73 mmHg  Pulse 81  Temp(Src) 98.6 F (37 C) (Oral)  Resp 18  Ht  (1.905 m)  Wt 285 lb (129.275 kg)  BMI 35.62 kg/m2  SpO2 99%  Physical Exam  Physical Exam  Constitutional: He is oriented to person, place, and time and well-developed, well-nourished, and in no distress. No distress.  HENT:  Head: Normocephalic and atraumatic.  Eyes: Conjunctivae are normal.  Neck: Neck supple. No thyromegaly present.    Cardiovascular: Normal rate, regular rhythm and normal heart sounds.   No murmur heard. Pulmonary/Chest: Effort normal and breath sounds normal. No respiratory distress.  Abdominal: He exhibits no distension and no mass. There is no tenderness.  Musculoskeletal: He exhibits no edema.  Neurological: He is alert and oriented to person, place, and time.  Skin: Skin is warm.  Psychiatric: Memory, affect and judgment normal.    Lab Results  Component Value Date   TSH 0.75 04/12/2015   Lab Results  Component Value Date   WBC 9.1 01/05/2015   HGB 14.8 01/05/2015   HCT 43.0 01/05/2015   MCV 87.0 01/05/2015  PLT 237 01/05/2015   Lab Results  Component Value Date   CREATININE 0.80 01/05/2015   BUN 13 01/05/2015   NA 142 01/05/2015   K 4.2 01/05/2015   CL 104 01/05/2015   CO2 27 01/05/2015   Lab Results  Component Value Date   ALT 22 01/05/2015   AST 16 01/05/2015   ALKPHOS 72 01/05/2015   BILITOT 0.6 01/05/2015   Lab Results  Component Value Date   CHOL 136 01/05/2015   Lab Results  Component Value Date   HDL 59 01/05/2015   Lab Results  Component Value Date   LDLCALC 61 01/05/2015   Lab Results  Component Value Date   TRIG 82 01/05/2015   Lab Results  Component Value Date   CHOLHDL 2.3 01/05/2015     Assessment & Plan  HTN (hypertension) Well controlled, no changes to meds. Encouraged heart healthy diet such as the DASH diet and exercise as tolerated.    Varicose veins Encouraged compression hose. Ultrasound of lower extremity due to increased edema. Negative for DVT, referred to vascular surgery for further consideration   MS (multiple sclerosis) Continues to work but struggles more and more with mobility and stability, using more assistive devices for mobility   Hypothyroidism On Levothyroxine, continue to monitor. TSH wnl today   Obesity Avoid offending foods, start probiotics. Do not eat large meals in late evening and consider raising head  of bed.    Pedal edema Jobst stockings, elevate feet bid, minimize sodium.   Hyperlipidemia Tolerating statin, encouraged heart healthy diet, avoid trans fats, minimize simple carbs and saturated fats. Increase exercise as tolerated   Depression with anxiety Has referred to counselor to help with grief counseling, continue current meds for now.

## 2015-04-28 NOTE — Assessment & Plan Note (Signed)
Well controlled, no changes to meds. Encouraged heart healthy diet such as the DASH diet and exercise as tolerated.  °

## 2015-04-28 NOTE — Assessment & Plan Note (Signed)
Jobst stockings, elevate feet bid, minimize sodium.

## 2015-05-04 ENCOUNTER — Ambulatory Visit (INDEPENDENT_AMBULATORY_CARE_PROVIDER_SITE_OTHER): Payer: Managed Care, Other (non HMO) | Admitting: Psychology

## 2015-05-04 DIAGNOSIS — F4323 Adjustment disorder with mixed anxiety and depressed mood: Secondary | ICD-10-CM | POA: Diagnosis not present

## 2015-05-11 ENCOUNTER — Other Ambulatory Visit: Payer: Self-pay | Admitting: Family Medicine

## 2015-05-14 ENCOUNTER — Other Ambulatory Visit: Payer: Self-pay | Admitting: Family Medicine

## 2015-05-14 ENCOUNTER — Encounter: Payer: Self-pay | Admitting: Vascular Surgery

## 2015-05-14 DIAGNOSIS — R600 Localized edema: Secondary | ICD-10-CM

## 2015-05-15 ENCOUNTER — Ambulatory Visit (INDEPENDENT_AMBULATORY_CARE_PROVIDER_SITE_OTHER): Payer: Managed Care, Other (non HMO) | Admitting: Vascular Surgery

## 2015-05-15 ENCOUNTER — Ambulatory Visit (HOSPITAL_COMMUNITY)
Admission: RE | Admit: 2015-05-15 | Discharge: 2015-05-15 | Disposition: A | Discharge status: Home / Self Care | Payer: Managed Care, Other (non HMO) | Source: Ambulatory Visit | Attending: Vascular Surgery | Admitting: Vascular Surgery

## 2015-05-15 ENCOUNTER — Encounter: Payer: Self-pay | Admitting: Vascular Surgery

## 2015-05-15 VITALS — BP 123/72 | HR 60 | Resp 16 | Ht 75.0 in | Wt 278.0 lb

## 2015-05-15 DIAGNOSIS — R600 Localized edema: Secondary | ICD-10-CM | POA: Insufficient documentation

## 2015-05-15 NOTE — Progress Notes (Signed)
Subjective:     Patient ID: Keith Morrow, male   DOB: Apr 21, 1971, 44 y.o.   MRN: [id number]  HPI this 44 year old male had laser ablation of the right great saphenous vein performed by me 2 years ago with successful result. He has recently noticed some worsening edema in the right leg below the knee. He has no varicosities and has had no history of DVT or thrombophlebitis stasis ulcers or bleeding since the procedure. He does elevate his legs at night some. He is concerned about the swelling.  Past Medical History  Diagnosis Date  . HTN (hypertension)   . Hypothyroidism   . Hyperlipidemia   . MS (multiple sclerosis)   . Coronary atherosclerosis of native coronary artery     a. NSTEMI 04/20/11: tx with Promus DES to LAD; bifurcation lesion with oDx 90% tx with POBA; b. staged PCI of right post. AV Branch with Promus DES; c. residual at cath 04/21/11:  AV groove CFX 50%, mRCA 30%,; EF 55%  . Chicken pox as a child  . Heart attack 04-20-11  . Allergy   . Obesity   . Depression   . Onychomycosis   . Tinea pedis   . Keloid scar   . Preventative health care 10/07/2011  . Reflux 10/07/2011  . 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 Hypotestosteronism 11/17/2011  . Dermatitis, contact 12/10/2011  . Bronchitis, acute 12/10/2011  . Testosterone deficiency 01/16/2012  . Fatigue 10/15/2012  . Acute bronchitis 05/09/2013  . Neuromuscular disorder     NUMBNESS/TINGLING  . Depression with anxiety     History  Substance Use Topics  . Smoking status: Never Smoker   . Smokeless tobacco: Never Used  . Alcohol Use: Yes     Comment: socially    Family History  Problem Relation Age of Onset  . Coronary artery disease      questionable in father  . Heart attack Mother 79  . Thyroid disease Mother   . Heart disease Mother   . Thyroid disease Sister   . Thyroid disease Brother   . Heart disease Maternal Grandmother   . Hypertension Maternal Grandmother   . Hyperlipidemia Maternal Grandmother    . Heart disease Maternal Grandfather   . Hypertension Maternal Grandfather   . Hyperlipidemia Maternal Grandfather   . Stroke Paternal Grandfather   . Thyroid disease Sister   . Heart disease Father     2 stents    No Known Allergies   Current outpatient prescriptions:  .  aspirin EC 81 MG tablet, Take 81 mg by mouth daily., Disp: , Rfl:  .  atorvastatin (LIPITOR) 40 MG tablet, Take 1 tablet (40 mg total) by mouth daily., Disp: 90 tablet, Rfl: 3 .  cetirizine (ZYRTEC) 10 MG tablet, Take 10 mg by mouth daily as needed for allergies. , Disp: , Rfl:  .  clotrimazole-betamethasone (LOTRISONE) cream, Apply to affected area 2 times daily, Disp: 90 g, Rfl: 2 .  Dalfampridine (AMPYRA) 10 MG TB12, Take 10 mg by mouth 2 (two) times daily. Every 12 hours, Disp: , Rfl:  .  DULoxetine (CYMBALTA) 60 MG capsule, Take 60 mg by mouth 2 (two) times daily., Disp: , Rfl:  .  levothyroxine (SYNTHROID, LEVOTHROID) 150 MCG tablet, Take 150 mcg by mouth daily before breakfast. , Disp: , Rfl:  .  LORazepam (ATIVAN) 1 MG tablet, Take 1 tablet (1 mg total) by mouth 2 (two) times daily as needed for anxiety., Disp: 40 tablet, Rfl:  2 .  methylphenidate (RITALIN) 10 MG tablet, Take 1 tablet (10 mg total) by mouth 2 (two) times daily as needed., Disp: 60 tablet, Rfl: 0 .  nitroGLYCERIN (NITROSTAT) 0.4 MG SL tablet, Place 0.4 mg under the tongue every 5 (five) minutes as needed for chest pain., Disp: , Rfl:  .  olmesartan (BENICAR) 20 MG tablet, Take 20 mg by mouth daily., Disp: , Rfl:  .  olmesartan (BENICAR) 20 MG tablet, Take 1 tablet (20 mg total) by mouth daily., Disp: 90 tablet, Rfl: 2 .  SYNTHROID 150 MCG tablet, TAKE ONE TABLET BY MOUTH DAILY BEFORE BREAKFAST, Disp: 30 tablet, Rfl: 2 .  tadalafil (CIALIS) 20 MG tablet, Take 0.5-1 tablets (10-20 mg total) by mouth every other day as needed for erectile dysfunction., Disp: 5 tablet, Rfl: 1 .  testosterone cypionate (DEPOTESTOTERONE CYPIONATE) 200 MG/ML injection,  Inject 1 mL (200 mg total) into the muscle See admin instructions. Every week for 3 weeks, then skip a week and restart again, Disp: 10 mL, Rfl: 0  Filed Vitals:   05/15/15 1445  BP: 123/72  Pulse: 60  Resp: 16  Height:  (1.905 m)  Weight: 278 lb (126.1 kg)    Body mass index is 34.75 kg/(m^2).           Review of Systems patient denies chest pain. Does have history of multiple sclerosis, coronary artery disease with previous myocardial infarction.     Objective:   Physical Exam BP 123/72 mmHg  Pulse 60  Resp 16  Ht  (1.905 m)  Wt 278 lb (126.1 kg)  BMI 34.75 kg/m2  Gen. well-developed well-nourished male in no apparent distress alert and oriented 3 Bilateral lower extremity weakness consistent with multiple sclerosis 1+ edema right leg below the knee with no bulging varicosities, hyperpigmentation, ulceration, or spider veins noted. 3+ dorsalis pedis pulse palpable.  Today a order venous duplex exam of the right leg which I reviewed and interpreted. There is no DVT. The great saphenous vein which was ablated previously is totally closed. There is no reflux in the small saphenous vein.     Assessment:     2 years post successful ablation right great saphenous vein-vein still completely totally ablated with no DVT Patient with some recurrent edema right leg below the knee    Plan:     #108 foot of bed 2-3 inches #2 short-leg elastic compression stockings 20-30 mm gradient to be placed on first day in the morning #3 no further suggestions and no indication for further intervention

## 2015-05-18 ENCOUNTER — Ambulatory Visit (INDEPENDENT_AMBULATORY_CARE_PROVIDER_SITE_OTHER): Payer: Managed Care, Other (non HMO) | Admitting: Psychology

## 2015-05-18 DIAGNOSIS — F4323 Adjustment disorder with mixed anxiety and depressed mood: Secondary | ICD-10-CM

## 2015-06-01 ENCOUNTER — Ambulatory Visit (INDEPENDENT_AMBULATORY_CARE_PROVIDER_SITE_OTHER): Payer: Managed Care, Other (non HMO) | Admitting: Psychology

## 2015-06-01 DIAGNOSIS — F4323 Adjustment disorder with mixed anxiety and depressed mood: Secondary | ICD-10-CM

## 2015-06-22 ENCOUNTER — Ambulatory Visit (INDEPENDENT_AMBULATORY_CARE_PROVIDER_SITE_OTHER): Payer: Managed Care, Other (non HMO) | Admitting: Psychology

## 2015-06-22 DIAGNOSIS — F4323 Adjustment disorder with mixed anxiety and depressed mood: Secondary | ICD-10-CM

## 2015-07-18 ENCOUNTER — Ambulatory Visit (INDEPENDENT_AMBULATORY_CARE_PROVIDER_SITE_OTHER): Payer: Managed Care, Other (non HMO) | Admitting: Psychology

## 2015-07-18 DIAGNOSIS — F4323 Adjustment disorder with mixed anxiety and depressed mood: Secondary | ICD-10-CM

## 2015-07-20 ENCOUNTER — Ambulatory Visit: Payer: Self-pay | Admitting: Family Medicine

## 2015-07-20 ENCOUNTER — Ambulatory Visit (INDEPENDENT_AMBULATORY_CARE_PROVIDER_SITE_OTHER): Payer: Managed Care, Other (non HMO) | Admitting: Psychology

## 2015-07-20 DIAGNOSIS — F4323 Adjustment disorder with mixed anxiety and depressed mood: Secondary | ICD-10-CM | POA: Diagnosis not present

## 2015-07-23 ENCOUNTER — Ambulatory Visit (INDEPENDENT_AMBULATORY_CARE_PROVIDER_SITE_OTHER): Payer: Managed Care, Other (non HMO) | Admitting: Family Medicine

## 2015-07-23 ENCOUNTER — Encounter: Payer: Self-pay | Admitting: Family Medicine

## 2015-07-23 ENCOUNTER — Ambulatory Visit (INDEPENDENT_AMBULATORY_CARE_PROVIDER_SITE_OTHER): Payer: Managed Care, Other (non HMO) | Admitting: Psychology

## 2015-07-23 VITALS — BP 122/73 | HR 85 | Temp 98.4°F | Ht 75.0 in | Wt 280.0 lb

## 2015-07-23 DIAGNOSIS — I10 Essential (primary) hypertension: Secondary | ICD-10-CM | POA: Diagnosis not present

## 2015-07-23 DIAGNOSIS — R600 Localized edema: Secondary | ICD-10-CM

## 2015-07-23 DIAGNOSIS — E039 Hypothyroidism, unspecified: Secondary | ICD-10-CM | POA: Diagnosis not present

## 2015-07-23 DIAGNOSIS — K219 Gastro-esophageal reflux disease without esophagitis: Secondary | ICD-10-CM

## 2015-07-23 DIAGNOSIS — F418 Other specified anxiety disorders: Secondary | ICD-10-CM

## 2015-07-23 DIAGNOSIS — E669 Obesity, unspecified: Secondary | ICD-10-CM

## 2015-07-23 DIAGNOSIS — E291 Testicular hypofunction: Secondary | ICD-10-CM

## 2015-07-23 DIAGNOSIS — IMO0001 Reserved for inherently not codable concepts without codable children: Secondary | ICD-10-CM

## 2015-07-23 DIAGNOSIS — E785 Hyperlipidemia, unspecified: Secondary | ICD-10-CM | POA: Diagnosis not present

## 2015-07-23 DIAGNOSIS — E349 Endocrine disorder, unspecified: Secondary | ICD-10-CM

## 2015-07-23 DIAGNOSIS — G35 Multiple sclerosis: Secondary | ICD-10-CM

## 2015-07-23 DIAGNOSIS — F4323 Adjustment disorder with mixed anxiety and depressed mood: Secondary | ICD-10-CM | POA: Diagnosis not present

## 2015-07-23 LAB — CBC
HCT: 42.7 % (ref 39.0–52.0)
Hemoglobin: 14.2 g/dL (ref 13.0–17.0)
MCHC: 33.3 g/dL (ref 30.0–36.0)
MCV: 89 fl (ref 78.0–100.0)
Platelets: 212 10*3/uL (ref 150.0–400.0)
RBC: 4.8 Mil/uL (ref 4.22–5.81)
RDW: 14.5 % (ref 11.5–15.5)
WBC: 7.9 10*3/uL (ref 4.0–10.5)

## 2015-07-23 LAB — COMPREHENSIVE METABOLIC PANEL
ALT: 26 U/L (ref 0–53)
AST: 20 U/L (ref 0–37)
Albumin: 4.2 g/dL (ref 3.5–5.2)
Alkaline Phosphatase: 62 U/L (ref 39–117)
BUN: 16 mg/dL (ref 6–23)
CO2: 34 mEq/L — ABNORMAL HIGH (ref 19–32)
Calcium: 9.2 mg/dL (ref 8.4–10.5)
Chloride: 104 mEq/L (ref 96–112)
Creatinine, Ser: 0.88 mg/dL (ref 0.40–1.50)
GFR: 100.74 mL/min (ref >60.00)
Glucose, Bld: 93 mg/dL (ref 70–99)
Potassium: 4 mEq/L (ref 3.5–5.1)
Sodium: 142 mEq/L (ref 135–145)
Total Bilirubin: 0.6 mg/dL (ref 0.2–1.2)
Total Protein: 6.9 g/dL (ref 6.0–8.3)

## 2015-07-23 LAB — LIPID PANEL
Cholesterol: 151 mg/dL (ref 0–200)
HDL: 58.7 mg/dL (ref >39.00)
LDL Cholesterol: 83 mg/dL (ref 0–99)
NonHDL: 92.01
Total CHOL/HDL Ratio: 3
Triglycerides: 47 mg/dL (ref 0.0–149.0)
VLDL: 9.4 mg/dL (ref 0.0–40.0)

## 2015-07-23 LAB — TESTOSTERONE: Testosterone: 239.19 ng/dL — ABNORMAL LOW (ref 300.00–890.00)

## 2015-07-23 LAB — TSH: TSH: 0.63 u[IU]/mL (ref 0.35–4.50)

## 2015-07-23 MED ORDER — METHYLPHENIDATE HCL 10 MG PO TABS: 10.0000 mg | ORAL_TABLET | Freq: Two times a day (BID) | ORAL | Status: DC | PRN

## 2015-07-23 NOTE — Patient Instructions (Addendum)
Low dose of a mood stabilizer such as Zyprexa, Abilify or Seroquel if depression gets worse or referral to psychiatry  Lavendar oil to feet or Lamisil  1g of folic acid daily to help with warts.  Basic Carbohydrate Counting for Diabetes Mellitus Carbohydrate counting is a method for keeping track of the amount of carbohydrates you eat. Eating carbohydrates naturally increases the level of sugar (glucose) in your blood, so it is important for you to know the amount that is okay for you to have in every meal. Carbohydrate counting helps keep the level of glucose in your blood within normal limits. The amount of carbohydrates allowed is different for every person. A dietitian can help you calculate the amount that is right for you. Once you know the amount of carbohydrates you can have, you can count the carbohydrates in the foods you want to eat. Carbohydrates are found in the following foods:  Grains, such as breads and cereals.  Dried beans and soy products.  Starchy vegetables, such as potatoes, peas, and corn.  Fruit and fruit juices.  Milk and yogurt.  Sweets and snack foods, such as cake, cookies, candy, chips, soft drinks, and fruit drinks. CARBOHYDRATE COUNTING There are two ways to count the carbohydrates in your food. You can use either of the methods or a combination of both. Reading the "Nutrition Facts" on Packaged Food The "Nutrition Facts" is an area that is included on the labels of almost all packaged food and beverages in the [place]. It includes the serving size of that food or beverage and information about the nutrients in each serving of the food, including the grams (g) of carbohydrate per serving.  Decide the number of servings of this food or beverage that you will be able to eat or drink. Multiply that number of servings by the number of grams of carbohydrate that is listed on the label for that serving. The total will be the amount of carbohydrates you will be  having when you eat or drink this food or beverage. Learning Standard Serving Sizes of Food When you eat food that is not packaged or does not include "Nutrition Facts" on the label, you need to measure the servings in order to count the amount of carbohydrates.A serving of most carbohydrate-rich foods contains about 15 g of carbohydrates. The following list includes serving sizes of carbohydrate-rich foods that provide 15 g ofcarbohydrate per serving:   1 slice of bread (1 oz) or 1 six-inch tortilla.    of a hamburger bun or English muffin.  4-6 crackers.   cup unsweetened dry cereal.    cup hot cereal.   cup rice or pasta.    cup mashed potatoes or  of a large baked potato.  1 cup fresh fruit or one small piece of fruit.    cup canned or frozen fruit or fruit juice.  1 cup milk.   cup plain fat-free yogurt or yogurt sweetened with artificial sweeteners.   cup cooked dried beans or starchy vegetable, such as peas, corn, or potatoes.  Decide the number of standard-size servings that you will eat. Multiply that number of servings by 15 (the grams of carbohydrates in that serving). For example, if you eat 2 cups of strawberries, you will have eaten 2 servings and 30 g of carbohydrates (2 servings x 15 g = 30 g). For foods such as soups and casseroles, in which more than one food is mixed in, you will need to count the carbohydrates  in each food that is included. EXAMPLE OF CARBOHYDRATE COUNTING Sample Dinner  3 oz chicken breast.   cup of brown rice.   cup of corn.  1 cup milk.   1 cup strawberries with sugar-free whipped topping.  Carbohydrate Calculation Step 1: Identify the foods that contain carbohydrates:   Rice.   Corn.   Milk.   Strawberries. Step 2:Calculate the number of servings eaten of each:   2 servings of rice.   1 serving of corn.   1 serving of milk.   1 serving of strawberries. Step 3: Multiply each of those number  of servings by 15 g:   2 servings of rice x 15 g = 30 g.   1 serving of corn x 15 g = 15 g.   1 serving of milk x 15 g = 15 g.   1 serving of strawberries x 15 g = 15 g. Step 4: Add together all of the amounts to find the total grams of carbohydrates eaten: 30 g + 15 g + 15 g + 15 g = 75 g. Document Released: 11/24/2005 Document Revised: 04/10/2014 Document Reviewed: 10/21/2013 [facility] ([facility]) Patient Information 2015 [place], [place]. This information is not intended to replace advice given to you by your health care provider. Make sure you discuss any questions you have with your health care provider.

## 2015-07-23 NOTE — Progress Notes (Signed)
Patient ID: Keith Morrow, male   DOB: 03-Sep-1971, 44 y.o.   MRN: [id number]   Subjective:    Patient ID: Keith Morrow, male    DOB: October 18, 1971, 44 y.o.   MRN: [id number]  Chief Complaint  Patient presents with  . Follow-up    3 month    HPI Patient is in today for follow up. Continues struggle with worsening MS but is noting improved strength and less falls with PT and treatment. He continues to struggle with depression due to his illness but is feeling that counseling is helping. Denies CP/palp/SOB/HA/congestion/fevers/GI or GU c/o. Taking meds as prescribed  Past Medical History  Diagnosis Date  . HTN (hypertension)   . Hypothyroidism   . Hyperlipidemia   . MS (multiple sclerosis)   . Coronary atherosclerosis of native coronary artery     a. NSTEMI 04/20/11: tx with Promus DES to LAD; bifurcation lesion with oDx 90% tx with POBA; b. staged PCI of right post. AV Branch with Promus DES; c. residual at cath 04/21/11:  AV groove CFX 50%, mRCA 30%,; EF 55%  . Chicken pox as a child  . Heart attack 04-20-11  . Allergy   . Obesity   . Depression   . Onychomycosis   . Tinea pedis   . Keloid scar   . Preventative health care 10/07/2011  . Reflux 10/07/2011  . 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 Hypotestosteronism 11/17/2011  . Dermatitis, contact 12/10/2011  . Bronchitis, acute 12/10/2011  . Testosterone deficiency 01/16/2012  . Fatigue 10/15/2012  . Acute bronchitis 05/09/2013  . Neuromuscular disorder     NUMBNESS/TINGLING  . Depression with anxiety     Past Surgical History  Procedure Laterality Date  . Right wrist surgery      CTR  . Vasectomy    . Vascular surgery      vericose vein in right femoral area  . Wisdom tooth extraction    . Coronary angioplasty with stent placement      2012  . Pain pump implantation N/A 08/29/2014    Procedure: Baclofen pump placement;  Surgeon: Maeola Harman, MD;  Location: [facility];  Service: Neurosurgery;  Laterality: N/A;   Baclofen pump placement  . Programable baclofen pump revision  08/29/14    Family History  Problem Relation Age of Onset  . Coronary artery disease      questionable in father  . Heart attack Mother 40  . Thyroid disease Mother   . Heart disease Mother   . Thyroid disease Sister   . Thyroid disease Brother   . Heart disease Maternal Grandmother   . Hypertension Maternal Grandmother   . Hyperlipidemia Maternal Grandmother   . Heart disease Maternal Grandfather   . Hypertension Maternal Grandfather   . Hyperlipidemia Maternal Grandfather   . Stroke Paternal Grandfather   . Thyroid disease Sister   . Heart disease Father     2 stents    Social History   Social History  . Marital Status: Married    Spouse Name: N/A  . Number of Children: 3  . Years of Education: N/A   Occupational History  . Not on file.   Social History Main Topics  . Smoking status: Never Smoker   . Smokeless tobacco: Never Used  . Alcohol Use: Yes     Comment: socially  . Drug Use: No  . Sexual Activity:    Partners: Female     Comment: lives with wife  and children, works [employer], no dietary restrictions   Other Topics Concern  . Not on file   Social History Narrative    Outpatient Prescriptions Prior to Visit  Medication Sig Dispense Refill  . aspirin EC 81 MG tablet Take 81 mg by mouth daily.    Marland Kitchen atorvastatin (LIPITOR) 40 MG tablet Take 1 tablet (40 mg total) by mouth daily. 90 tablet 3  . cetirizine (ZYRTEC) 10 MG tablet Take 10 mg by mouth daily as needed for allergies.     . clotrimazole-betamethasone (LOTRISONE) cream Apply to affected area 2 times daily 90 g 2  . Dalfampridine (AMPYRA) 10 MG TB12 Take 10 mg by mouth 2 (two) times daily. Every 12 hours    . DULoxetine (CYMBALTA) 60 MG capsule Take 60 mg by mouth 2 (two) times daily.    Marland Kitchen levothyroxine (SYNTHROID, LEVOTHROID) 150 MCG tablet Take 150 mcg by mouth daily before breakfast.     . LORazepam (ATIVAN) 1 MG tablet  Take 1 tablet (1 mg total) by mouth 2 (two) times daily as needed for anxiety. 40 tablet 2  . methylphenidate (RITALIN) 10 MG tablet Take 1 tablet (10 mg total) by mouth 2 (two) times daily as needed. 60 tablet 0  . nitroGLYCERIN (NITROSTAT) 0.4 MG SL tablet Place 0.4 mg under the tongue every 5 (five) minutes as needed for chest pain.    Marland Kitchen olmesartan (BENICAR) 20 MG tablet Take 20 mg by mouth daily.    Marland Kitchen olmesartan (BENICAR) 20 MG tablet Take 1 tablet (20 mg total) by mouth daily. 90 tablet 2  . SYNTHROID 150 MCG tablet TAKE ONE TABLET BY MOUTH DAILY BEFORE BREAKFAST 30 tablet 2  . tadalafil (CIALIS) 20 MG tablet Take 0.5-1 tablets (10-20 mg total) by mouth every other day as needed for erectile dysfunction. 5 tablet 1  . testosterone cypionate (DEPOTESTOTERONE CYPIONATE) 200 MG/ML injection Inject 1 mL (200 mg total) into the muscle See admin instructions. Every week for 3 weeks, then skip a week and restart again 10 mL 0   No facility-administered medications prior to visit.    No Known Allergies  Review of Systems  Constitutional: Negative for fever and malaise/fatigue.  HENT: Negative for congestion.   Eyes: Negative for discharge.  Respiratory: Negative for shortness of breath.   Cardiovascular: Negative for chest pain, palpitations and leg swelling.  Gastrointestinal: Negative for nausea and abdominal pain.  Genitourinary: Negative for dysuria.  Musculoskeletal: Negative for falls.  Skin: Negative for rash.  Neurological: Negative for loss of consciousness and headaches.  Endo/Heme/Allergies: Negative for environmental allergies.  Psychiatric/Behavioral: Positive for depression. The patient is nervous/anxious.        Objective:    Physical Exam  Constitutional: He is oriented to person, place, and time. He appears well-developed and well-nourished. No distress.  HENT:  Head: Normocephalic and atraumatic.  Nose: Nose normal.  Eyes: Right eye exhibits no discharge. Left eye  exhibits no discharge.  Neck: Normal range of motion. Neck supple.  Cardiovascular: Normal rate and regular rhythm.   No murmur heard. Pulmonary/Chest: Effort normal and breath sounds normal.  Abdominal: Soft. Bowel sounds are normal. There is no tenderness.  Musculoskeletal: He exhibits edema.  Braces on legs  Neurological: He is alert and oriented to person, place, and time.  Skin: Skin is warm and dry.  Psychiatric: He has a normal mood and affect.  Nursing note and vitals reviewed.   BP 122/73 mmHg  Pulse 85  Temp(Src) 98.4 F (  36.9 C) (Oral)  Ht  (1.905 m)  Wt 280 lb (127.007 kg)  BMI 35.00 kg/m2  SpO2 98% Wt Readings from Last 3 Encounters:  07/23/15 280 lb (127.007 kg)  05/15/15 278 lb (126.1 kg)  04/12/15 285 lb (129.275 kg)     Lab Results  Component Value Date   WBC 9.1 01/05/2015   HGB 14.8 01/05/2015   HCT 43.0 01/05/2015   PLT 237 01/05/2015   GLUCOSE 86 01/05/2015   CHOL 136 01/05/2015   TRIG 82 01/05/2015   HDL 59 01/05/2015   LDLCALC 61 01/05/2015   ALT 22 01/05/2015   AST 16 01/05/2015   NA 142 01/05/2015   K 4.2 01/05/2015   CL 104 01/05/2015   CREATININE 0.80 01/05/2015   BUN 13 01/05/2015   CO2 27 01/05/2015   TSH 0.75 04/12/2015   INR 1.00 04/21/2011    Lab Results  Component Value Date   TSH 0.75 04/12/2015   Lab Results  Component Value Date   WBC 9.1 01/05/2015   HGB 14.8 01/05/2015   HCT 43.0 01/05/2015   MCV 87.0 01/05/2015   PLT 237 01/05/2015   Lab Results  Component Value Date   NA 142 01/05/2015   K 4.2 01/05/2015   CO2 27 01/05/2015   GLUCOSE 86 01/05/2015   BUN 13 01/05/2015   CREATININE 0.80 01/05/2015   BILITOT 0.6 01/05/2015   ALKPHOS 72 01/05/2015   AST 16 01/05/2015   ALT 22 01/05/2015   PROT 6.7 01/05/2015   ALBUMIN 4.3 01/05/2015   CALCIUM 9.2 01/05/2015   ANIONGAP 10 08/15/2014   GFR 81.51 10/11/2012   Lab Results  Component Value Date   CHOL 136 01/05/2015   Lab Results  Component  Value Date   HDL 59 01/05/2015   Lab Results  Component Value Date   LDLCALC 61 01/05/2015   Lab Results  Component Value Date   TRIG 82 01/05/2015   Lab Results  Component Value Date   CHOLHDL 2.3 01/05/2015   No results found for: HGBA1C     Assessment & Plan:   MS (multiple sclerosis) Continues to struggle but his recent PT and treatments have been increasing his strength.   Hypothyroidism On Levothyroxine, continue to monitor  Obesity Encouraged DASH diet, decrease po intake and increase exercise as tolerated. Needs 7-8 hours of sleep nightly. Avoid trans fats, eat small, frequent meals every 4-5 hours with lean proteins, complex carbs and healthy fats. Minimize simple carbs  Depression with anxiety Discussed options, is managing on current meds and does feel that counseling is helping. No change in meds at this time.  Reflux Avoid offending foods, start probiotics. Do not eat large meals in late evening and consider raising head of bed.   Pedal edema Encouraged jobst stockings, minimize sodium stay active, elevate feet.   Hypotestosteronism Had missed some doses of testosterone, declines topical treatments, responds to shots.    I am having Mr. Goyne maintain his dalfampridine, cetirizine, aspirin EC, DULoxetine, nitroGLYCERIN, olmesartan, levothyroxine, atorvastatin, testosterone cypionate, tadalafil, olmesartan, LORazepam, methylphenidate, clotrimazole-betamethasone, and SYNTHROID.  No orders of the defined types were placed in this encounter.     Baird Kay, LPN

## 2015-07-23 NOTE — Progress Notes (Signed)
Pre visit review using our clinic review tool, if applicable. No additional management support is needed unless otherwise documented below in the visit note. 

## 2015-08-03 ENCOUNTER — Ambulatory Visit (INDEPENDENT_AMBULATORY_CARE_PROVIDER_SITE_OTHER): Payer: Managed Care, Other (non HMO) | Admitting: Psychology

## 2015-08-03 DIAGNOSIS — F4323 Adjustment disorder with mixed anxiety and depressed mood: Secondary | ICD-10-CM | POA: Diagnosis not present

## 2015-08-05 NOTE — Assessment & Plan Note (Signed)
Had missed some doses of testosterone, declines topical treatments, responds to shots.

## 2015-08-05 NOTE — Assessment & Plan Note (Signed)
Discussed options, is managing on current meds and does feel that counseling is helping. No change in meds at this time.

## 2015-08-05 NOTE — Assessment & Plan Note (Signed)
On Levothyroxine, continue to monitor 

## 2015-08-05 NOTE — Assessment & Plan Note (Signed)
Encouraged jobst stockings, minimize sodium stay active, elevate feet.

## 2015-08-05 NOTE — Assessment & Plan Note (Signed)
Avoid offending foods, start probiotics. Do not eat large meals in late evening and consider raising head of bed.  

## 2015-08-05 NOTE — Assessment & Plan Note (Signed)
Encouraged DASH diet, decrease po intake and increase exercise as tolerated. Needs 7-8 hours of sleep nightly. Avoid trans fats, eat small, frequent meals every 4-5 hours with lean proteins, complex carbs and healthy fats. Minimize simple carbs 

## 2015-08-05 NOTE — Assessment & Plan Note (Signed)
Continues to struggle but his recent PT and treatments have been increasing his strength.

## 2015-08-15 ENCOUNTER — Ambulatory Visit (INDEPENDENT_AMBULATORY_CARE_PROVIDER_SITE_OTHER): Payer: Managed Care, Other (non HMO) | Admitting: Psychology

## 2015-08-15 ENCOUNTER — Other Ambulatory Visit: Payer: Self-pay | Admitting: Family Medicine

## 2015-08-15 ENCOUNTER — Encounter: Payer: Self-pay | Admitting: Family Medicine

## 2015-08-15 DIAGNOSIS — F4323 Adjustment disorder with mixed anxiety and depressed mood: Secondary | ICD-10-CM | POA: Diagnosis not present

## 2015-08-15 NOTE — Telephone Encounter (Signed)
LOV and labs 07/23/15

## 2015-08-17 ENCOUNTER — Ambulatory Visit (INDEPENDENT_AMBULATORY_CARE_PROVIDER_SITE_OTHER): Payer: Managed Care, Other (non HMO) | Admitting: Psychology

## 2015-08-17 ENCOUNTER — Ambulatory Visit: Payer: Managed Care, Other (non HMO) | Admitting: Psychology

## 2015-08-17 DIAGNOSIS — F4323 Adjustment disorder with mixed anxiety and depressed mood: Secondary | ICD-10-CM

## 2015-09-03 ENCOUNTER — Encounter: Payer: Self-pay | Admitting: Family Medicine

## 2015-09-03 ENCOUNTER — Ambulatory Visit (INDEPENDENT_AMBULATORY_CARE_PROVIDER_SITE_OTHER): Payer: Managed Care, Other (non HMO) | Admitting: Family Medicine

## 2015-09-03 VITALS — BP 114/64 | HR 93 | Temp 98.7°F | Ht 75.0 in | Wt 279.0 lb

## 2015-09-03 DIAGNOSIS — K219 Gastro-esophageal reflux disease without esophagitis: Secondary | ICD-10-CM

## 2015-09-03 DIAGNOSIS — I10 Essential (primary) hypertension: Secondary | ICD-10-CM | POA: Diagnosis not present

## 2015-09-03 DIAGNOSIS — F418 Other specified anxiety disorders: Secondary | ICD-10-CM

## 2015-09-03 DIAGNOSIS — Z23 Encounter for immunization: Secondary | ICD-10-CM

## 2015-09-03 DIAGNOSIS — E349 Endocrine disorder, unspecified: Secondary | ICD-10-CM

## 2015-09-03 DIAGNOSIS — E291 Testicular hypofunction: Secondary | ICD-10-CM | POA: Diagnosis not present

## 2015-09-03 DIAGNOSIS — G35 Multiple sclerosis: Secondary | ICD-10-CM

## 2015-09-03 DIAGNOSIS — E039 Hypothyroidism, unspecified: Secondary | ICD-10-CM | POA: Diagnosis not present

## 2015-09-03 DIAGNOSIS — IMO0001 Reserved for inherently not codable concepts without codable children: Secondary | ICD-10-CM

## 2015-09-03 NOTE — Assessment & Plan Note (Signed)
On Levothyroxine, continue to monitor 

## 2015-09-03 NOTE — Assessment & Plan Note (Signed)
Well controlled, no changes to meds. Encouraged heart healthy diet such as the DASH diet and exercise as tolerated.  °

## 2015-09-03 NOTE — Progress Notes (Signed)
Patient ID: Keith Morrow, male   DOB: [birth year], 44 y.o.   MRN: [id number]   Subjective:    Patient ID: Keith Morrow, male    DOB: [birth year], 44 y.o.   MRN: [id number]  Chief Complaint  Patient presents with  . Follow-up    HPI Patient is in today for follow-up. He has generally been doing well. Notes 5 out of the past 6 weeks were very good. He continues with counseling every other week with good results. Declines changes in medications at this time. Does not endorse any suicidal ideation or any acute concerns. Continues to struggle with fatigue secondary to his MS at his neurologist has been kind enough to take over his prescription for his Ritalin. He denies any difficulties with the Ritalin. Denies CP/palp/SOB/HA/congestion/fevers/GI or GU c/o. Taking meds as prescribed  Past Medical History  Diagnosis Date  . HTN (hypertension)   . Hypothyroidism   . Hyperlipidemia   . MS (multiple sclerosis)   . Coronary atherosclerosis of native coronary artery     a. NSTEMI 04/20/11: tx with Promus DES to LAD; bifurcation lesion with oDx 90% tx with POBA; b. staged PCI of right post. AV Branch with Promus DES; c. residual at cath 04/21/11:  AV groove CFX 50%, mRCA 30%,; EF 55%  . Chicken pox as a child  . Heart attack 04-20-11  . Allergy   . Obesity   . Depression   . Onychomycosis   . Tinea pedis   . Keloid scar   . Preventative health care 10/07/2011  . Reflux 10/07/2011  . 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 Hypotestosteronism 11/17/2011  . Dermatitis, contact 12/10/2011  . Bronchitis, acute 12/10/2011  . Testosterone deficiency 01/16/2012  . Fatigue 10/15/2012  . Acute bronchitis 05/09/2013  . Neuromuscular disorder     NUMBNESS/TINGLING  . Depression with anxiety     Past Surgical History  Procedure Laterality Date  . Right wrist surgery      CTR  . Vasectomy    . Vascular surgery      vericose vein in right femoral area  . Wisdom tooth extraction    . Coronary  angioplasty with stent placement      2012  . Pain pump implantation N/A 08/29/2014    Procedure: Baclofen pump placement;  Surgeon: Maeola Harman, MD;  Location: [facility];  Service: Neurosurgery;  Laterality: N/A;  Baclofen pump placement  . Programable baclofen pump revision  08/29/14    Family History  Problem Relation Age of Onset  . Coronary artery disease      questionable in father  . Heart attack Mother 52  . Thyroid disease Mother   . Heart disease Mother   . Thyroid disease Sister   . Thyroid disease Brother   . Heart disease Maternal Grandmother   . Hypertension Maternal Grandmother   . Hyperlipidemia Maternal Grandmother   . Heart disease Maternal Grandfather   . Hypertension Maternal Grandfather   . Hyperlipidemia Maternal Grandfather   . Stroke Paternal Grandfather   . Thyroid disease Sister   . Heart disease Father     2 stents    Social History   Social History  . Marital Status: Married    Spouse Name: N/A  . Number of Children: 3  . Years of Education: N/A   Occupational History  . Not on file.   Social History Main Topics  . Smoking status: Never Smoker   . Smokeless tobacco:  Never Used  . Alcohol Use: Yes     Comment: socially  . Drug Use: No  . 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 Not on file   Social History Narrative    Outpatient Prescriptions Prior to Visit  Medication Sig Dispense Refill  . aspirin EC 81 MG tablet Take 81 mg by mouth daily.    Marland Kitchen atorvastatin (LIPITOR) 40 MG tablet Take 1 tablet (40 mg total) by mouth daily. 90 tablet 3  . cetirizine (ZYRTEC) 10 MG tablet Take 10 mg by mouth daily as needed for allergies.     . clotrimazole-betamethasone (LOTRISONE) cream Apply to affected area 2 times daily 90 g 2  . Dalfampridine (AMPYRA) 10 MG TB12 Take 10 mg by mouth 2 (two) times daily. Every 12 hours    .  DULoxetine (CYMBALTA) 60 MG capsule Take 60 mg by mouth 2 (two) times daily.    Marland Kitchen levothyroxine (SYNTHROID, LEVOTHROID) 150 MCG tablet Take 150 mcg by mouth daily before breakfast.     . LORazepam (ATIVAN) 1 MG tablet Take 1 tablet (1 mg total) by mouth 2 (two) times daily as needed for anxiety. 40 tablet 2  . methylphenidate (RITALIN) 10 MG tablet Take 1 tablet (10 mg total) by mouth 2 (two) times daily as needed. 60 tablet 0  . nitroGLYCERIN (NITROSTAT) 0.4 MG SL tablet Place 0.4 mg under the tongue every 5 (five) minutes as needed for chest pain.    Marland Kitchen olmesartan (BENICAR) 20 MG tablet Take 20 mg by mouth daily.    Marland Kitchen olmesartan (BENICAR) 20 MG tablet Take 1 tablet (20 mg total) by mouth daily. 90 tablet 2  . SYNTHROID 150 MCG tablet TAKE ONE TABLET BY MOUTH DAILY BEFORE BREAKFAST 30 tablet 2  . tadalafil (CIALIS) 20 MG tablet Take 0.5-1 tablets (10-20 mg total) by mouth every other day as needed for erectile dysfunction. 5 tablet 1  . testosterone cypionate (DEPOTESTOTERONE CYPIONATE) 200 MG/ML injection Inject 1 mL (200 mg total) into the muscle See admin instructions. Every week for 3 weeks, then skip a week and restart again 10 mL 0   No facility-administered medications prior to visit.    No Known Allergies  Review of Systems  Constitutional: Positive for malaise/fatigue. Negative for fever.  HENT: Positive for congestion.   Eyes: Negative for discharge.  Respiratory: Negative for shortness of breath.   Cardiovascular: Negative for chest pain, palpitations and leg swelling.  Gastrointestinal: Negative for nausea and abdominal pain.  Genitourinary: Negative for dysuria.  Musculoskeletal: Negative for falls.  Skin: Negative for rash.  Neurological: Negative for loss of consciousness and headaches.  Endo/Heme/Allergies: Negative for environmental allergies.  Psychiatric/Behavioral: Negative for depression. The patient is not nervous/anxious.        Objective:    Physical Exam    Constitutional: He is oriented to person, place, and time. He appears well-developed and well-nourished. No distress.  HENT:  Head: Normocephalic and atraumatic.  Nose: Nose normal.  Eyes: Right eye exhibits no discharge. Left eye exhibits no discharge.  Neck: Normal range of motion. Neck supple.  Cardiovascular: Normal rate and regular rhythm.   No murmur heard. Pulmonary/Chest: Effort normal and breath sounds normal.  Abdominal: Soft. Bowel sounds are normal. There is no tenderness.  Musculoskeletal: He exhibits no edema.  Neurological: He is alert and oriented to person, place, and  time.  Walks with crutches b/l  Skin: Skin is warm and dry.  Psychiatric: He has a normal mood and affect.  Nursing note and vitals reviewed.   BP 114/64 mmHg  Pulse 93  Temp(Src) 98.7 F (37.1 C) (Oral)  Ht  (1.905 m)  Wt 279 lb (126.554 kg)  BMI 34.87 kg/m2  SpO2 97% Wt Readings from Last 3 Encounters:  09/03/15 279 lb (126.554 kg)  07/23/15 280 lb (127.007 kg)  05/15/15 278 lb (126.1 kg)     Lab Results  Component Value Date   WBC 7.9 07/23/2015   HGB 14.2 07/23/2015   HCT 42.7 07/23/2015   PLT 212.0 07/23/2015   GLUCOSE 93 07/23/2015   CHOL 151 07/23/2015   TRIG 47.0 07/23/2015   HDL 58.70 07/23/2015   LDLCALC 83 07/23/2015   ALT 26 07/23/2015   AST 20 07/23/2015   NA 142 07/23/2015   K 4.0 07/23/2015   CL 104 07/23/2015   CREATININE 0.88 07/23/2015   BUN 16 07/23/2015   CO2 34* 07/23/2015   TSH 0.63 07/23/2015   INR 1.00 04/21/2011    Lab Results  Component Value Date   TSH 0.63 07/23/2015   Lab Results  Component Value Date   WBC 7.9 07/23/2015   HGB 14.2 07/23/2015   HCT 42.7 07/23/2015   MCV 89.0 07/23/2015   PLT 212.0 07/23/2015   Lab Results  Component Value Date   NA 142 07/23/2015   K 4.0 07/23/2015   CO2 34* 07/23/2015   GLUCOSE 93 07/23/2015   BUN 16 07/23/2015   CREATININE 0.88 07/23/2015   BILITOT 0.6 07/23/2015   ALKPHOS 62 07/23/2015    AST 20 07/23/2015   ALT 26 07/23/2015   PROT 6.9 07/23/2015   ALBUMIN 4.2 07/23/2015   CALCIUM 9.2 07/23/2015   ANIONGAP 10 08/15/2014   GFR 100.74 07/23/2015   Lab Results  Component Value Date   CHOL 151 07/23/2015   Lab Results  Component Value Date   HDL 58.70 07/23/2015   Lab Results  Component Value Date   LDLCALC 83 07/23/2015   Lab Results  Component Value Date   TRIG 47.0 07/23/2015   Lab Results  Component Value Date   CHOLHDL 3 07/23/2015   No results found for: HGBA1C     Assessment & Plan:   HTN (hypertension) Well controlled, no changes to meds. Encouraged heart healthy diet such as the DASH diet and exercise as tolerated.   Depression with anxiety Doing fairly well with counseling and ritalin for MS fatigue  Reflux Avoid offending foods, start probiotics. Do not eat large meals in late evening and consider raising head of bed.   Leg edema, right Acceptable, variable, been tolerable, remember to elevate feet and wear comprsssion socks. Minimize sodium in idet  Hypothyroidism On Levothyroxine, continue to monitor  MS (multiple sclerosis) Following closely with neuro sees Dr Antionette Char monthly they have agreed to take over his Ritalin for his MS fatigue and that is helpful    I am having Mr. Holquin maintain his dalfampridine, cetirizine, aspirin EC, DULoxetine, nitroGLYCERIN, olmesartan, levothyroxine, atorvastatin, testosterone cypionate, tadalafil, olmesartan, LORazepam, clotrimazole-betamethasone, methylphenidate, and SYNTHROID.  No orders of the defined types were placed in this encounter.     Baird Kay, LPN

## 2015-09-03 NOTE — Assessment & Plan Note (Signed)
Following closely with neuro sees Dr Antionette Char monthly they have agreed to take over his Ritalin for his MS fatigue and that is helpful

## 2015-09-03 NOTE — Assessment & Plan Note (Signed)
Avoid offending foods, start probiotics. Do not eat large meals in late evening and consider raising head of bed.  

## 2015-09-03 NOTE — Patient Instructions (Addendum)
Elderberry syrup, Vitamin C, aged garlic and zinc to help soothe cold like symptoms   Cholesterol Cholesterol is a white, waxy, fat-like substance needed by your body in small amounts. The liver makes all the cholesterol you need. Cholesterol is carried from the liver by the blood through the blood vessels. Deposits of cholesterol (plaque) may build up on blood vessel walls. These make the arteries narrower and stiffer. Cholesterol plaques increase the risk for heart attack and stroke.  You cannot feel your cholesterol level even if it is very high. The only way to know it is high is with a blood test. Once you know your cholesterol levels, you should keep a record of the test results. Work with your health care provider to keep your levels in the desired range.  WHAT DO THE RESULTS MEAN?  Total cholesterol is a rough measure of all the cholesterol in your blood.   LDL is the so-called bad cholesterol. This is the type that deposits cholesterol in the walls of the arteries. You want this level to be low.   HDL is the good cholesterol because it cleans the arteries and carries the LDL away. You want this level to be high.  Triglycerides are fat that the body can either burn for energy or store. High levels are closely linked to heart disease.  WHAT ARE THE DESIRED LEVELS OF CHOLESTEROL?  Total cholesterol below 200.   LDL below 100 for people at risk, below 70 for those at very high risk.   HDL above 50 is good, above 60 is best.   Triglycerides below 150.  HOW CAN I LOWER MY CHOLESTEROL?  Diet. Follow your diet programs as directed by your health care provider.   Choose fish or white meat chicken and [place], roasted or baked. Limit fatty cuts of red meat, fried foods, and processed meats, such as sausage and lunch meats.   Eat lots of fresh fruits and vegetables.  Choose whole grains, beans, pasta, potatoes, and cereals.   Use only small amounts of olive, corn, or canola  oils.   Avoid butter, mayonnaise, shortening, or palm kernel oils.  Avoid foods with trans fats.   Drink skim or nonfat milk and eat low-fat or nonfat yogurt and cheeses. Avoid whole milk, cream, ice cream, egg yolks, and full-fat cheeses.   Healthy desserts include angel food cake, ginger snaps, animal crackers, hard candy, popsicles, and low-fat or nonfat frozen yogurt. Avoid pastries, cakes, pies, and cookies.   Exercise. Follow your exercise programs as directed by your health care provider.   A regular program helps decrease LDL and raise HDL.   A regular program helps with weight control.   Do things that increase your activity level like gardening, walking, or taking the stairs. Ask your health care provider about how you can be more active in your daily life.   Medicine. Take medicine only as directed by your health care provider.   Medicine may be prescribed by your health care provider to help lower cholesterol and decrease the risk for heart disease.   If you have several risk factors, you may need medicine even if your levels are normal. Document Released: 08/19/2001 Document Revised: 04/10/2014 Document Reviewed: 09/07/2013 [facility] Patient Information 2015 [place], [place]. This information is not intended to replace advice given to you by your health care provider. Make sure you discuss any questions you have with your health care provider.

## 2015-09-03 NOTE — Progress Notes (Signed)
Pre visit review using our clinic review tool, if applicable. No additional management support is needed unless otherwise documented below in the visit note. 

## 2015-09-03 NOTE — Assessment & Plan Note (Signed)
Doing fairly well with counseling and ritalin for MS fatigue

## 2015-09-03 NOTE — Assessment & Plan Note (Signed)
Acceptable, variable, been tolerable, remember to elevate feet and wear comprsssion socks. Minimize sodium in idet

## 2015-09-05 ENCOUNTER — Ambulatory Visit (INDEPENDENT_AMBULATORY_CARE_PROVIDER_SITE_OTHER): Payer: Managed Care, Other (non HMO) | Admitting: Psychology

## 2015-09-05 DIAGNOSIS — F4323 Adjustment disorder with mixed anxiety and depressed mood: Secondary | ICD-10-CM

## 2015-09-07 ENCOUNTER — Ambulatory Visit (INDEPENDENT_AMBULATORY_CARE_PROVIDER_SITE_OTHER): Payer: Managed Care, Other (non HMO) | Admitting: Psychology

## 2015-09-07 DIAGNOSIS — F4323 Adjustment disorder with mixed anxiety and depressed mood: Secondary | ICD-10-CM

## 2015-09-16 ENCOUNTER — Other Ambulatory Visit: Payer: Self-pay | Admitting: Family Medicine

## 2015-09-17 NOTE — Telephone Encounter (Signed)
Faxed hardcopy for Testosterone to [facility]

## 2015-09-18 ENCOUNTER — Telehealth: Payer: Self-pay

## 2015-09-18 NOTE — Telephone Encounter (Signed)
Pharmacy called for clarification on Testosterone order. Per [name] patient to have 1 ml IM every 14 days. Pharmacy [facility]) notified.

## 2015-09-21 ENCOUNTER — Ambulatory Visit (INDEPENDENT_AMBULATORY_CARE_PROVIDER_SITE_OTHER): Payer: Managed Care, Other (non HMO) | Admitting: Psychology

## 2015-09-21 DIAGNOSIS — F4323 Adjustment disorder with mixed anxiety and depressed mood: Secondary | ICD-10-CM

## 2015-10-01 ENCOUNTER — Ambulatory Visit (INDEPENDENT_AMBULATORY_CARE_PROVIDER_SITE_OTHER): Payer: Managed Care, Other (non HMO) | Admitting: Psychology

## 2015-10-01 DIAGNOSIS — F4323 Adjustment disorder with mixed anxiety and depressed mood: Secondary | ICD-10-CM

## 2015-10-10 ENCOUNTER — Encounter: Payer: Self-pay | Admitting: Family Medicine

## 2015-10-10 ENCOUNTER — Ambulatory Visit (INDEPENDENT_AMBULATORY_CARE_PROVIDER_SITE_OTHER): Payer: Managed Care, Other (non HMO) | Admitting: Family Medicine

## 2015-10-10 ENCOUNTER — Ambulatory Visit (HOSPITAL_BASED_OUTPATIENT_CLINIC_OR_DEPARTMENT_OTHER)
Admission: RE | Admit: 2015-10-10 | Discharge: 2015-10-10 | Disposition: A | Discharge status: Home / Self Care | Payer: Managed Care, Other (non HMO) | Source: Ambulatory Visit | Attending: Family Medicine | Admitting: Family Medicine

## 2015-10-10 VITALS — BP 120/72 | HR 76 | Temp 97.8°F | Resp 16 | Ht 75.0 in | Wt 285.2 lb

## 2015-10-10 DIAGNOSIS — R0602 Shortness of breath: Secondary | ICD-10-CM

## 2015-10-10 DIAGNOSIS — R0600 Dyspnea, unspecified: Secondary | ICD-10-CM | POA: Insufficient documentation

## 2015-10-10 DIAGNOSIS — R600 Localized edema: Secondary | ICD-10-CM

## 2015-10-10 MED ORDER — FUROSEMIDE 20 MG PO TABS: 20.0000 mg | ORAL_TABLET | Freq: Every day | ORAL | Status: DC

## 2015-10-10 NOTE — Assessment & Plan Note (Signed)
New.  Pt's sxs are concerning when coupled w/ edema.  R/o CHF w/ labs, CXR.  EKG WNL.  Start Lasix.  If shortness of breath persists, will need f/u w/ Dr Excell Seltzer for possible stress test in setting of previous MI.  Reviewed supportive care and red flags that should prompt return.  Pt expressed understanding and is in agreement w/ plan.

## 2015-10-10 NOTE — Progress Notes (Signed)
Pre visit review using our clinic review tool, if applicable. No additional management support is needed unless otherwise documented below in the visit note. 

## 2015-10-10 NOTE — Patient Instructions (Signed)
Follow up as needed Go downstairs and get your chest xray We'll notify you of your lab results and make any changes if needed Limit your salt intake- restaurant food, processed meats (bacon, sausage, ham, lunch meat), pizza, soups, frozen dinners Elevate your legs as much as possible Start the Lasix once daily to improve the swelling (take 1 when you get home and then take daily in AM b/c it will increase urination) Call with any questions or concerns- particularly if symptoms worsen Hang in there!!!

## 2015-10-10 NOTE — Assessment & Plan Note (Signed)
Deteriorated.  Pt now w/ 1-2+ pitting edema bilaterally coupled w/ SOB on exertion.  Must r/o CHF exacerbation, renal issues.  Check labs, get CXR.  EKG WNL.  Start Lasix.  Reviewed supportive care and red flags that should prompt return.  Pt expressed understanding and is in agreement w/ plan.

## 2015-10-10 NOTE — Progress Notes (Signed)
   Subjective:    Patient ID: Keith Morrow, male    DOB: Nov 11, 1971, 44 y.o.   MRN: [id number]  HPI Edema- pt reports hx of similar but 'it's been awhile'.  Pt has gained 6 lbs since last visit.  Pt reports that swelling is present even 1st thing in the AM.  Pt denies changes to diet- but in last day pt has had bacon, corn chips, chili.  Pt reports mild SOB after exertion.  No SOB when lying flat.  No CP.  Pt has hx of MI 2012.  Pt sees Dr Excell Seltzer (Cards).   Review of Systems For ROS see HPI     Objective:   Physical Exam  Constitutional: He is oriented to person, place, and time. He appears well-developed and well-nourished. No distress.  HENT:  Head: Normocephalic and atraumatic.  Eyes: Conjunctivae and EOM are normal. Pupils are equal, round, and reactive to light.  Neck: Normal range of motion. Neck supple. No thyromegaly present.  Cardiovascular: Normal rate, regular rhythm, normal heart sounds and intact distal pulses.   No murmur heard. Pulmonary/Chest: Effort normal and breath sounds normal. No respiratory distress.  Abdominal: Soft. Bowel sounds are normal. He exhibits no distension.  Musculoskeletal: He exhibits edema (1-2+ pitting edema bilaterally). He exhibits no tenderness.  Lymphadenopathy:    He has no cervical adenopathy.  Neurological: He is alert and oriented to person, place, and time. No cranial nerve deficit.  Skin: Skin is warm and dry.  Psychiatric: He has a normal mood and affect. His behavior is normal.  Vitals reviewed.         Assessment & Plan:

## 2015-10-11 ENCOUNTER — Other Ambulatory Visit: Payer: Self-pay | Admitting: General Practice

## 2015-10-11 LAB — HEPATIC FUNCTION PANEL
ALT: 24 U/L (ref 0–53)
AST: 22 U/L (ref 0–37)
Albumin: 3.9 g/dL (ref 3.5–5.2)
Alkaline Phosphatase: 51 U/L (ref 39–117)
Bilirubin, Direct: 0.1 mg/dL (ref 0.0–0.3)
Total Bilirubin: 0.5 mg/dL (ref 0.2–1.2)
Total Protein: 6.4 g/dL (ref 6.0–8.3)

## 2015-10-11 LAB — CBC WITH DIFFERENTIAL/PLATELET
Basophils Absolute: 0.3 10*3/uL — ABNORMAL HIGH (ref 0.0–0.1)
Basophils Relative: 3.4 % — ABNORMAL HIGH (ref 0.0–3.0)
Eosinophils Absolute: 0.6 10*3/uL (ref 0.0–0.7)
Eosinophils Relative: 6.6 % — ABNORMAL HIGH (ref 0.0–5.0)
HCT: 43.9 % (ref 39.0–52.0)
Hemoglobin: 14.3 g/dL (ref 13.0–17.0)
Lymphocytes Relative: 29.5 % (ref 12.0–46.0)
Lymphs Abs: 2.5 10*3/uL (ref 0.7–4.0)
MCHC: 32.5 g/dL (ref 30.0–36.0)
MCV: 90.4 fl (ref 78.0–100.0)
Monocytes Absolute: 0.9 10*3/uL (ref 0.1–1.0)
Monocytes Relative: 10.2 % (ref 3.0–12.0)
Neutro Abs: 4.3 10*3/uL (ref 1.4–7.7)
Neutrophils Relative %: 50.3 % (ref 43.0–77.0)
Platelets: 211 10*3/uL (ref 150.0–400.0)
RBC: 4.86 Mil/uL (ref 4.22–5.81)
RDW: 14.7 % (ref 11.5–15.5)
WBC: 8.5 10*3/uL (ref 4.0–10.5)

## 2015-10-11 LAB — BASIC METABOLIC PANEL
BUN: 9 mg/dL (ref 6–23)
CO2: 30 mEq/L (ref 19–32)
Calcium: 9.1 mg/dL (ref 8.4–10.5)
Chloride: 106 mEq/L (ref 96–112)
Creatinine, Ser: 0.88 mg/dL (ref 0.40–1.50)
GFR: 100.63 mL/min (ref >60.00)
Glucose, Bld: 93 mg/dL (ref 70–99)
Potassium: 4 mEq/L (ref 3.5–5.1)
Sodium: 142 mEq/L (ref 135–145)

## 2015-10-11 LAB — TSH: TSH: 0.18 u[IU]/mL — ABNORMAL LOW (ref 0.35–4.50)

## 2015-10-11 LAB — BRAIN NATRIURETIC PEPTIDE: Pro B Natriuretic peptide (BNP): 9 pg/mL (ref 0.0–100.0)

## 2015-10-11 MED ORDER — SYNTHROID 137 MCG PO TABS: 137.0000 ug | ORAL_TABLET | Freq: Every day | ORAL | Status: DC

## 2015-10-15 ENCOUNTER — Ambulatory Visit (INDEPENDENT_AMBULATORY_CARE_PROVIDER_SITE_OTHER): Payer: Managed Care, Other (non HMO) | Admitting: Psychology

## 2015-10-15 DIAGNOSIS — F4323 Adjustment disorder with mixed anxiety and depressed mood: Secondary | ICD-10-CM

## 2015-10-19 ENCOUNTER — Ambulatory Visit: Payer: Managed Care, Other (non HMO) | Admitting: Psychology

## 2015-10-29 ENCOUNTER — Other Ambulatory Visit (INDEPENDENT_AMBULATORY_CARE_PROVIDER_SITE_OTHER): Payer: Managed Care, Other (non HMO)

## 2015-10-29 ENCOUNTER — Encounter: Payer: Self-pay | Admitting: Family Medicine

## 2015-10-29 DIAGNOSIS — E291 Testicular hypofunction: Secondary | ICD-10-CM | POA: Diagnosis not present

## 2015-10-29 DIAGNOSIS — E349 Endocrine disorder, unspecified: Secondary | ICD-10-CM

## 2015-10-29 LAB — TESTOSTERONE: Testosterone: 442.32 ng/dL (ref 300.00–890.00)

## 2015-10-30 ENCOUNTER — Other Ambulatory Visit: Payer: Self-pay | Admitting: Family Medicine

## 2015-10-30 ENCOUNTER — Encounter: Payer: Self-pay | Admitting: Cardiovascular Disease

## 2015-10-30 MED ORDER — TESTOSTERONE CYPIONATE 200 MG/ML IM SOLN: INTRAMUSCULAR | Status: DC

## 2015-10-31 ENCOUNTER — Ambulatory Visit (INDEPENDENT_AMBULATORY_CARE_PROVIDER_SITE_OTHER): Payer: Managed Care, Other (non HMO) | Admitting: Psychology

## 2015-10-31 DIAGNOSIS — F4323 Adjustment disorder with mixed anxiety and depressed mood: Secondary | ICD-10-CM

## 2015-11-13 ENCOUNTER — Other Ambulatory Visit: Payer: Self-pay | Admitting: Family Medicine

## 2015-11-16 ENCOUNTER — Ambulatory Visit (INDEPENDENT_AMBULATORY_CARE_PROVIDER_SITE_OTHER): Payer: Managed Care, Other (non HMO) | Admitting: Psychology

## 2015-11-16 DIAGNOSIS — F4323 Adjustment disorder with mixed anxiety and depressed mood: Secondary | ICD-10-CM

## 2015-11-22 ENCOUNTER — Ambulatory Visit (INDEPENDENT_AMBULATORY_CARE_PROVIDER_SITE_OTHER): Payer: Managed Care, Other (non HMO) | Admitting: Cardiovascular Disease

## 2015-11-22 ENCOUNTER — Encounter: Payer: Self-pay | Admitting: Cardiovascular Disease

## 2015-11-22 VITALS — BP 116/72 | HR 101 | Ht 75.0 in | Wt 287.0 lb

## 2015-11-22 DIAGNOSIS — I10 Essential (primary) hypertension: Secondary | ICD-10-CM

## 2015-11-22 DIAGNOSIS — E785 Hyperlipidemia, unspecified: Secondary | ICD-10-CM | POA: Diagnosis not present

## 2015-11-22 DIAGNOSIS — I2510 Atherosclerotic heart disease of native coronary artery without angina pectoris: Secondary | ICD-10-CM

## 2015-11-22 MED ORDER — ATORVASTATIN CALCIUM 40 MG PO TABS: 40.0000 mg | ORAL_TABLET | Freq: Every day | ORAL | Status: DC

## 2015-11-22 NOTE — Patient Instructions (Signed)

## 2015-11-22 NOTE — Progress Notes (Signed)
Cardiology Office Note Date:  11/22/2015   ID:  Keith Morrow, DOB November 15, 1971, MRN [id number]  [name], MD  [name], MD    Chief Complaint  Patient presents with  . Coronary atherosclerosis of native coronary artery    History of Present Illness: Keith Morrow is a 44 y.o. male who presents for follow-up of coronary artery disease. The patient initially presented in 2012 with a non-ST elevation infarction and he was treated with stenting of the LAD and angioplasty of the first diagonal followed by staged PCI of the posterior AV segment of the right coronary artery. Other medical problems include multiple sclerosis, hypertension, hyperlipidemia, and hypothyroidism.  The patient has no cardiac related complaints today.Today, he denies symptoms of palpitations, chest pain, shortness of breath, orthopnea, PND, dizziness, or syncope. The symptoms of multiple sclerosis have progressed and he is having much more difficulty with ambulation and leg spasms. He's had some swelling in his legs and is wearing support socks.   Past Medical History  Diagnosis Date  . HTN (hypertension)   . Hypothyroidism   . Hyperlipidemia   . MS (multiple sclerosis) (HCC)   . Coronary atherosclerosis of native coronary artery     a. NSTEMI 04/20/11: tx with Promus DES to LAD; bifurcation lesion with oDx 90% tx with POBA; b. staged PCI of right post. AV Branch with Promus DES; c. residual at cath 04/21/11:  AV groove CFX 50%, mRCA 30%,; EF 55%  . Chicken pox as a child  . Heart attack (HCC) 04-20-11  . Allergy   . Obesity   . Depression   . Onychomycosis   . Tinea pedis   . Keloid scar   . Preventative health care 10/07/2011  . Reflux 10/07/2011  . 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 Hypotestosteronism 11/17/2011  . Dermatitis, contact 12/10/2011  . Bronchitis, acute 12/10/2011  . Testosterone deficiency 01/16/2012  . Fatigue 10/15/2012  . Acute bronchitis 05/09/2013    . Neuromuscular disorder (HCC)     NUMBNESS/TINGLING  . Depression with anxiety     Past Surgical History  Procedure Laterality Date  . Right wrist surgery      CTR  . Vasectomy    . Vascular surgery      vericose vein in right femoral area  . Wisdom tooth extraction    . Coronary angioplasty with stent placement      2012  . Pain pump implantation N/A 08/29/2014    Procedure: Baclofen pump placement;  Surgeon: Maeola Harman, MD;  Location: [facility];  Service: Neurosurgery;  Laterality: N/A;  Baclofen pump placement  . Programable baclofen pump revision  08/29/14    Current Outpatient Prescriptions  Medication Sig Dispense Refill  . aspirin EC 81 MG tablet Take 81 mg by mouth daily.    Marland Kitchen atorvastatin (LIPITOR) 40 MG tablet Take 1 tablet (40 mg total) by mouth daily. 90 tablet 3  . cetirizine (ZYRTEC) 10 MG tablet Take 10 mg by mouth daily as needed for allergies.     . clotrimazole-betamethasone (LOTRISONE) cream Apply to affected area 2 times daily 90 g 2  . cyclobenzaprine (FLEXERIL) 10 MG tablet Take 10 mg by mouth 3 (three) times daily as needed for muscle spasms.    . Dalfampridine (AMPYRA) 10 MG TB12 Take 10 mg by mouth 2 (two) times daily. Every 12 hours    . DULoxetine (CYMBALTA) 60 MG capsule Take 60 mg by mouth  2 (two) times daily.    . furosemide (LASIX) 20 MG tablet Take 1 tablet (20 mg total) by mouth daily. 30 tablet 3  . LORazepam (ATIVAN) 1 MG tablet Take 1 tablet (1 mg total) by mouth 2 (two) times daily as needed for anxiety. 40 tablet 2  . methylphenidate (RITALIN) 10 MG tablet Take 10 mg by mouth 2 (two) times daily as needed.    . nitroGLYCERIN (NITROSTAT) 0.4 MG SL tablet Place 0.4 mg under the tongue every 5 (five) minutes as needed for chest pain (x 3 doses).     . olmesartan (BENICAR) 20 MG tablet Take 20 mg by mouth daily.    Marland Kitchen olmesartan (BENICAR) 20 MG tablet Take 1 tablet (20 mg total) by mouth daily. 90 tablet 2  . SYNTHROID 137 MCG tablet Take 1  tablet (137 mcg total) by mouth daily before breakfast. 30 tablet 2  . SYNTHROID 150 MCG tablet TAKE ONE TABLET BY MOUTH DAILY BEFORE BREAKFAST 30 tablet 6  . tadalafil (CIALIS) 20 MG tablet Take 0.5-1 tablets (10-20 mg total) by mouth every other day as needed for erectile dysfunction. 5 tablet 1  . testosterone cypionate (DEPOTESTOSTERONE CYPIONATE) 200 MG/ML injection INJECT INTO THE MUSCLE EVERY 14 DAYS. EVERY WEEK FOR 3 WEEKS THEN SKIP A WEEK 10 mL 1   No current facility-administered medications for this visit.    Allergies:   Review of patient's allergies indicates no known allergies.   Social History:  The patient  reports that he has never smoked. He has never used smokeless tobacco. He reports that he drinks alcohol. He reports that he does not use illicit drugs.   Family History:  The patient's  family history includes Heart attack (age of onset: 33) in his mother; Heart disease in his father, maternal grandfather, maternal grandmother, and mother; Hyperlipidemia in his maternal grandfather and maternal grandmother; Hypertension in his maternal grandfather and maternal grandmother; Stroke in his paternal grandfather; Thyroid disease in his brother, mother, sister, and sister.    ROS:  Please see the history of present illness.  Otherwise, review of systems is positive for leg swelling.  All other systems are reviewed and negative.    PHYSICAL EXAM: VS:  Ht 6\' 3"  (1.905 m) , BMI There is no weight on file to calculate BMI. GEN: Well nourished, well developed, in no acute distress HEENT: normal Neck: no JVD, no masses. No carotid bruits Cardiac: RRR without murmur or gallop                Respiratory:  clear to auscultation bilaterally, normal work of breathing GI: soft, nontender, nondistended, + BS MS: no deformity or atrophy Ext: 2+ edema on the right and 1+ edema on the right edema, pedal pulses 2+= bilaterally Skin: warm and dry, no rash Neuro:  Strength and sensation  are intact Psych: euthymic mood, full affect  EKG:  EKG is not ordered today. EKG from 10/10/2015 reviewed and shows normal sinus rhythm, within normal limits.  Recent Labs: 10/10/2015: ALT 24; BUN 9; Creatinine, Ser 0.88; Hemoglobin 14.3; Platelets 211.0; Potassium 4.0; Pro B Natriuretic peptide (BNP) 9.0; Sodium 142; TSH 0.18*   Lipid Panel     Component Value Date/Time   CHOL 151 07/23/2015 0924   TRIG 47.0 07/23/2015 0924   HDL 58.70 07/23/2015 0924   CHOLHDL 3 07/23/2015 0924   VLDL 9.4 07/23/2015 0924   LDLCALC 83 07/23/2015 0924      Wt Readings from Last 3  Encounters:  10/10/15 285 lb 4 oz (129.389 kg)  09/03/15 279 lb (126.554 kg)  07/23/15 280 lb (127.007 kg)    ASSESSMENT AND PLAN: 1.  Coronary artery disease, native vessel, without symptoms of angina: Plan to continue medical therapy with aspirin, atorvastatin, and Benicar. The patient will return for follow-up in one year.  2. Hyperlipidemia: The patient takes atorvastatin. His most recent lipids from August 2016 are reviewed as above.   3. Hypertension: Blood pressure is controlled on Benicar.  Current medicines are reviewed with the patient today.  The patient does not have concerns regarding medicines.  Labs/ tests ordered today include:  No orders of the defined types were placed in this encounter.    Disposition:   FU one year  Signed, Tonny Bollman, MD  11/22/2015 8:12 AM    [facility] [address] [place], [place], [place]  [address] Phone: [phone]; Fax: [phone]

## 2015-12-07 ENCOUNTER — Encounter (HOSPITAL_COMMUNITY): Payer: Self-pay | Admitting: *Deleted

## 2015-12-07 ENCOUNTER — Emergency Department (HOSPITAL_COMMUNITY): Payer: Managed Care, Other (non HMO)

## 2015-12-07 ENCOUNTER — Emergency Department (HOSPITAL_COMMUNITY)
Admission: EM | Admit: 2015-12-07 | Discharge: 2015-12-08 | Disposition: A | Discharge status: Home / Self Care | Payer: Managed Care, Other (non HMO) | Attending: Emergency Medicine | Admitting: Emergency Medicine

## 2015-12-07 DIAGNOSIS — Z8619 Personal history of other infectious and parasitic diseases: Secondary | ICD-10-CM | POA: Diagnosis not present

## 2015-12-07 DIAGNOSIS — Z8709 Personal history of other diseases of the respiratory system: Secondary | ICD-10-CM | POA: Insufficient documentation

## 2015-12-07 DIAGNOSIS — R079 Chest pain, unspecified: Secondary | ICD-10-CM | POA: Diagnosis present

## 2015-12-07 DIAGNOSIS — I10 Essential (primary) hypertension: Secondary | ICD-10-CM | POA: Insufficient documentation

## 2015-12-07 DIAGNOSIS — F418 Other specified anxiety disorders: Secondary | ICD-10-CM | POA: Insufficient documentation

## 2015-12-07 DIAGNOSIS — Z79899 Other long term (current) drug therapy: Secondary | ICD-10-CM | POA: Diagnosis not present

## 2015-12-07 DIAGNOSIS — R0789 Other chest pain: Secondary | ICD-10-CM | POA: Insufficient documentation

## 2015-12-07 DIAGNOSIS — E669 Obesity, unspecified: Secondary | ICD-10-CM | POA: Diagnosis not present

## 2015-12-07 DIAGNOSIS — Z7982 Long term (current) use of aspirin: Secondary | ICD-10-CM | POA: Insufficient documentation

## 2015-12-07 DIAGNOSIS — Z9861 Coronary angioplasty status: Secondary | ICD-10-CM | POA: Diagnosis not present

## 2015-12-07 DIAGNOSIS — G35 Multiple sclerosis: Secondary | ICD-10-CM | POA: Diagnosis not present

## 2015-12-07 DIAGNOSIS — Z872 Personal history of diseases of the skin and subcutaneous tissue: Secondary | ICD-10-CM | POA: Insufficient documentation

## 2015-12-07 DIAGNOSIS — E039 Hypothyroidism, unspecified: Secondary | ICD-10-CM | POA: Diagnosis not present

## 2015-12-07 DIAGNOSIS — I2510 Atherosclerotic heart disease of native coronary artery without angina pectoris: Secondary | ICD-10-CM | POA: Insufficient documentation

## 2015-12-07 DIAGNOSIS — I509 Heart failure, unspecified: Secondary | ICD-10-CM | POA: Diagnosis not present

## 2015-12-07 DIAGNOSIS — E785 Hyperlipidemia, unspecified: Secondary | ICD-10-CM | POA: Diagnosis not present

## 2015-12-07 LAB — CBC
HCT: 43.7 % (ref 39.0–52.0)
Hemoglobin: 14 g/dL (ref 13.0–17.0)
MCH: 28.5 pg (ref 26.0–34.0)
MCHC: 32 g/dL (ref 30.0–36.0)
MCV: 89 fL (ref 78.0–100.0)
Platelets: 204 10*3/uL (ref 150–400)
RBC: 4.91 MIL/uL (ref 4.22–5.81)
RDW: 14.5 % (ref 11.5–15.5)
WBC: 8.9 10*3/uL (ref 4.0–10.5)

## 2015-12-07 LAB — TROPONIN I
Troponin I: <0.03 ng/mL (ref <0.031)
Troponin I: <0.03 ng/mL (ref <0.031)

## 2015-12-07 LAB — BASIC METABOLIC PANEL
Anion gap: 10 (ref 5–15)
BUN: 12 mg/dL (ref 6–20)
CO2: 25 mmol/L (ref 22–32)
Calcium: 8.9 mg/dL (ref 8.9–10.3)
Chloride: 104 mmol/L (ref 101–111)
Creatinine, Ser: 0.93 mg/dL (ref 0.61–1.24)
GFR calc Af Amer: >60 mL/min (ref >60)
GFR calc non Af Amer: >60 mL/min (ref >60)
Glucose, Bld: 93 mg/dL (ref 65–99)
Potassium: 4.1 mmol/L (ref 3.5–5.1)
Sodium: 139 mmol/L (ref 135–145)

## 2015-12-07 LAB — I-STAT TROPONIN, ED: Troponin i, poc: 0 ng/mL (ref 0.00–0.08)

## 2015-12-07 NOTE — ED Notes (Signed)
The pt had some lt and rt chest pain after walking approx 2 hours ago  No other symptoms.  He had some similar pain last week.  He has taken 2 sl nitro without any change in  The pain. Hx of mi 2012

## 2015-12-07 NOTE — ED Provider Notes (Signed)
CSN: [id number]     Arrival date & time 12/07/15  1601 History   First MD Initiated Contact with Patient 12/07/15 2130     Chief Complaint  Patient presents with  . Chest Pain     (Consider location/radiation/quality/duration/timing/severity/associated sxs/prior Treatment) HPI Keith Morrow is a 44 y.o. male with history of coronary artery disease status post stent placement in 2012, MS, hypertension, hyperlipidemia, comes in for evaluation of chest pain. Patient reports at approximately 2:30 PM this afternoon, while driving his car he "felt like a horse kicked me in my chest and I felt a sudden pressure that radiated from my left chest to my right chest". He reports sensation lasted for approximately 1.5 hours, not relieved with 2 sublingual nitroglycerin. Denies any associated nausea, vomiting, shortness of breath, diaphoresis, numbness or weakness. He reports this does not feel similar to when he had his previous MI. Denies any discomfort now, reports pain resolved upon arrival to ED. Patient reports recently having met with his cardiologist 2 weeks ago for regular visit with no new findings. No other fevers, chills, cough, travel, abdominal pain, diarrhea or constipation. No new leg swelling.   Past Medical History  Diagnosis Date  . HTN (hypertension)   . Hypothyroidism   . Hyperlipidemia   . MS (multiple sclerosis) ([place])   . Coronary atherosclerosis of native coronary artery     a. NSTEMI 04/20/11: tx with Promus DES to LAD; bifurcation lesion with oDx 90% tx with POBA; b. staged PCI of right post. AV Branch with Promus DES; c. residual at cath 04/21/11:  AV groove CFX 50%, mRCA 30%,; EF 55%  . Chicken pox as a child  . Heart attack ([place]) 04-20-11  . Allergy   . Obesity   . Depression   . Onychomycosis   . Tinea pedis   . Keloid scar   . Preventative health care 10/07/2011  . Reflux 10/07/2011  . 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 Hypotestosteronism 11/17/2011  .  Dermatitis, contact 12/10/2011  . Bronchitis, acute 12/10/2011  . Testosterone deficiency 01/16/2012  . Fatigue 10/15/2012  . Acute bronchitis 05/09/2013  . Neuromuscular disorder ([facility])     NUMBNESS/TINGLING  . Depression with anxiety    Past Surgical History  Procedure Laterality Date  . Right wrist surgery      CTR  . Vasectomy    . Vascular surgery      vericose vein in right femoral area  . Wisdom tooth extraction    . Coronary angioplasty with stent placement      2012  . Pain pump implantation N/A 08/29/2014    Procedure: Baclofen pump placement;  Surgeon: Erline Levine, MD;  Location: [place] [facility];  Service: Neurosurgery;  Laterality: N/A;  Baclofen pump placement  . Programable baclofen pump revision  08/29/14   Family History  Problem Relation Age of Onset  . Coronary artery disease      questionable in father  . Heart attack Mother 74  . Thyroid disease Mother   . Heart disease Mother   . Thyroid disease Sister   . Thyroid disease Brother   . Heart disease Maternal Grandmother   . Hypertension Maternal Grandmother   . Hyperlipidemia Maternal Grandmother   . Heart disease Maternal Grandfather   . Hypertension Maternal Grandfather   . Hyperlipidemia Maternal Grandfather   . Stroke Paternal Grandfather   . Thyroid disease Sister   . Heart disease Father     2 stents  Social History  Substance Use Topics  . Smoking status: Never Smoker   . Smokeless tobacco: Never Used  . Alcohol Use: Yes     Comment: socially    Review of Systems A 10 point review of systems was completed and was negative except for pertinent positives and negatives as mentioned in the history of present illness     Allergies  Review of patient's allergies indicates no known allergies.  Home Medications   Prior to Admission medications   Medication Sig Start Date End Date Taking? Authorizing Provider  aspirin EC 81 MG tablet Take 81 mg by mouth daily.   Yes Historical Provider, MD   atorvastatin (LIPITOR) 40 MG tablet Take 1 tablet (40 mg total) by mouth daily. 11/22/15  Yes Sherren Mocha, MD  baclofen (LIORESAL) 20 MG tablet Take 20 mg by mouth 3 (three) times daily.   Yes Historical Provider, MD  cetirizine (ZYRTEC) 10 MG tablet Take 10 mg by mouth daily as needed for allergies.    Yes Historical Provider, MD  cyclobenzaprine (FLEXERIL) 10 MG tablet Take 10 mg by mouth 3 (three) times daily as needed for muscle spasms.   Yes Historical Provider, MD  Dalfampridine (AMPYRA) 10 MG TB12 Take 10 mg by mouth 2 (two) times daily. Every 12 hours   Yes Historical Provider, MD  DULoxetine (CYMBALTA) 60 MG capsule Take 60 mg by mouth 2 (two) times daily.   Yes Historical Provider, MD  furosemide (LASIX) 20 MG tablet Take 1 tablet (20 mg total) by mouth daily. 10/10/15  Yes Midge Minium, MD  ibuprofen (ADVIL,MOTRIN) 200 MG tablet Take 200 mg by mouth every 6 (six) hours as needed for moderate pain.   Yes Historical Provider, MD  LORazepam (ATIVAN) 1 MG tablet Take 1 tablet (1 mg total) by mouth 2 (two) times daily as needed for anxiety. 04/12/15  Yes [name], MD  methylphenidate (RITALIN) 10 MG tablet Take 10 mg by mouth See admin instructions. Takes daily, can take addt'l tab as needed   Yes Historical Provider, MD  nitroGLYCERIN (NITROSTAT) 0.4 MG SL tablet Place 0.4 mg under the tongue every 5 (five) minutes as needed for chest pain (x 3 doses).    Yes Historical Provider, MD  olmesartan (BENICAR) 20 MG tablet Take 1 tablet (20 mg total) by mouth daily. 04/12/15  Yes [name], MD  sildenafil (VIAGRA) 100 MG tablet Take 100 mg by mouth daily as needed for erectile dysfunction.   Yes Historical Provider, MD  SYNTHROID 150 MCG tablet TAKE ONE TABLET BY MOUTH DAILY BEFORE BREAKFAST 11/13/15  Yes [name], MD  testosterone cypionate (DEPOTESTOSTERONE CYPIONATE) 200 MG/ML injection Inject 240 mg into the muscle every 7 (seven) days. Inject 1.2 mL into the muscle every 7  days   Yes Historical Provider, MD   BP 123/77 mmHg  Pulse 107  Temp(Src) 98.2 F (36.8 C)  Resp 23  Ht '6\' 3"'  (1.905 m)  Wt 127.461 kg  BMI 35.12 kg/m2  SpO2 95% Physical Exam  Constitutional: He is oriented to person, place, and time. He appears well-developed and well-nourished.  HENT:  Head: Normocephalic and atraumatic.  Mouth/Throat: Oropharynx is clear and moist.  Eyes: Conjunctivae are normal. Pupils are equal, round, and reactive to light. Right eye exhibits no discharge. Left eye exhibits no discharge. No scleral icterus.  Neck: Neck supple.  Cardiovascular: Normal rate, regular rhythm and normal heart sounds.   Pulmonary/Chest: Effort normal and breath sounds normal.  No respiratory distress. He has no wheezes. He has no rales.  Abdominal: Soft. There is no tenderness.  Musculoskeletal: He exhibits edema. He exhibits no tenderness.  1+ pitting edema to bilateral lateral shins. Patient reports is baseline  Neurological: He is alert and oriented to person, place, and time.  Cranial Nerves II-XII grossly intact  Skin: Skin is warm and dry. No rash noted.  Psychiatric: He has a normal mood and affect.  Nursing note and vitals reviewed.   ED Course  Procedures (including critical care time) Labs Review Labs Reviewed  BASIC METABOLIC PANEL  CBC  TROPONIN I  TROPONIN I  I-STAT TROPOININ, ED    Imaging Review Dg Chest 2 View  12/07/2015  CLINICAL DATA:  Right-sided chest pain after walking. EXAM: CHEST  2 VIEW COMPARISON:  10/10/2015 FINDINGS: There is no focal parenchymal opacity. There is no pleural effusion or pneumothorax. The heart and mediastinal contours are unremarkable. The osseous structures are unremarkable. IMPRESSION: No active cardiopulmonary disease. Electronically Signed   By: Kathreen Devoid   On: 12/07/2015 17:12   I have personally reviewed and evaluated these images and lab results as part of my medical decision-making.   EKG Interpretation None       ED ECG REPORT   Date: 12/08/2015  Rate: 89  Rhythm: normal sinus rhythm  QRS Axis: normal  Intervals: normal  ST/T Wave abnormalities: normal  Conduction Disutrbances:none  Narrative Interpretation:   Old EKG Reviewed: unchanged  I have personally reviewed the EKG tracing and agree with the computerized printout as noted.  Meds given in ED:  Medications - No data to display  New Prescriptions   No medications on file   Filed Vitals:   12/07/15 2200 12/07/15 2215 12/07/15 2230 12/07/15 2245  BP: 125/78 128/73 124/72 123/77  Pulse: 90 82 84 107  Temp:      Resp: '17 17 16 23  ' Height:      Weight:      SpO2: 93% 93% 94% 95%    MDM  Keith Morrow is a 44 y.o. male with history of MS and known CAD status post stent placement August 2012, on aspirin therapy, followed by Dr. Burt Knack comes in for evaluation of chest pain. Patient reports atypical pain is dissimilar to previous pain associated with his MI. The discomfort was not exertional. He reports only mild discomfort in the ED. Thinks that his discomfort could possibly be due to his muscles as he has been using his walking canes more often to help him walk. EKG is reassuring without any ischemic changes, delta troponin is negative. Chest x-ray negative for any acute cardiopulmonary disease. Low suspicion at this time for ACS. Low Wells score, doubt PE. Also considered pneumonia, but unlikely. Patient has close follow-up with his cardiologist. We discussed follow-up in the next 1-2 days for reevaluation and patient agrees with this plan. Also discussed return precautions including worsening pain, shortness of breath, numbness or weakness or other unusual symptoms. Overall, patient appears well, nontoxic, hemodynamically stable and appropriate for discharge. Prior to patient discharge, I discussed and reviewed this case with Dr. Eulis Foster The patient appears reasonably screened and/or stabilized for discharge and I doubt any other medical  condition or other [facility] requiring further screening, evaluation, or treatment in the ED at this time prior to discharge.    Final diagnoses:  Chest discomfort        Comer Locket, PA-C 12/08/15 0020  Daleen Bo, MD 12/08/15 [phone]

## 2015-12-08 NOTE — ED Notes (Signed)
Pt verbalized understanding of d/c instructions and has no further questions. Pt to follow up with cardiologist on Monday.

## 2015-12-08 NOTE — Discharge Instructions (Signed)
There does not appear to be an emergent cause for your chest discomfort at this time. Your exam, EKG, chest x-ray and labs were all very reassuring. However, it is important for you to follow-up with your cardiologist in the next 1-2 days for reevaluation. Return to the ED for any new or worsening symptoms as we discussed.  Nonspecific Chest Pain  Chest pain can be caused by many different conditions. There is always a chance that your pain could be related to something serious, such as a heart attack or a blood clot in your lungs. Chest pain can also be caused by conditions that are not life-threatening. If you have chest pain, it is very important to follow up with your health care provider. CAUSES  Chest pain can be caused by:  Heartburn.  Pneumonia or bronchitis.  Anxiety or stress.  Inflammation around your heart (pericarditis) or lung (pleuritis or pleurisy).  A blood clot in your lung.  A collapsed lung (pneumothorax). It can develop suddenly on its own (spontaneous pneumothorax) or from trauma to the chest.  Shingles infection (varicella-zoster virus).  Heart attack.  Damage to the bones, muscles, and cartilage that make up your chest wall. This can include:  Bruised bones due to injury.  Strained muscles or cartilage due to frequent or repeated coughing or overwork.  Fracture to one or more ribs.  Sore cartilage due to inflammation (costochondritis). RISK FACTORS  Risk factors for chest pain may include:  Activities that increase your risk for trauma or injury to your chest.  Respiratory infections or conditions that cause frequent coughing.  Medical conditions or overeating that can cause heartburn.  Heart disease or family history of heart disease.  Conditions or health behaviors that increase your risk of developing a blood clot.  Having had chicken pox (varicella zoster). SIGNS AND SYMPTOMS Chest pain can feel like:  Burning or tingling on the surface of  your chest or deep in your chest.  Crushing, pressure, aching, or squeezing pain.  Dull or sharp pain that is worse when you move, cough, or take a deep breath.  Pain that is also felt in your back, neck, shoulder, or arm, or pain that spreads to any of these areas. Your chest pain may come and go, or it may stay constant. DIAGNOSIS Lab tests or other studies may be needed to find the cause of your pain. Your health care provider may have you take a test called an ambulatory ECG (electrocardiogram). An ECG records your heartbeat patterns at the time the test is performed. You may also have other tests, such as:  Transthoracic echocardiogram (TTE). During echocardiography, sound waves are used to create a picture of all of the heart structures and to look at how blood flows through your heart.  Transesophageal echocardiogram (TEE).This is a more advanced imaging test that obtains images from inside your body. It allows your health care provider to see your heart in finer detail.  Cardiac monitoring. This allows your health care provider to monitor your heart rate and rhythm in real time.  Holter monitor. This is a portable device that records your heartbeat and can help to diagnose abnormal heartbeats. It allows your health care provider to track your heart activity for several days, if needed.  Stress tests. These can be done through exercise or by taking medicine that makes your heart beat more quickly.  Blood tests.  Imaging tests. TREATMENT  Your treatment depends on what is causing your chest pain. Treatment may  include:  Medicines. These may include:  Acid blockers for heartburn.  Anti-inflammatory medicine.  Pain medicine for inflammatory conditions.  Antibiotic medicine, if an infection is present.  Medicines to dissolve blood clots.  Medicines to treat coronary artery disease.  Supportive care for conditions that do not require medicines. This may  include:  Resting.  Applying heat or cold packs to injured areas.  Limiting activities until pain decreases. HOME CARE INSTRUCTIONS  If you were prescribed an antibiotic medicine, finish it all even if you start to feel better.  Avoid any activities that bring on chest pain.  Do not use any tobacco products, including cigarettes, chewing tobacco, or electronic cigarettes. If you need help quitting, ask your health care provider.  Do not drink alcohol.  Take medicines only as directed by your health care provider.  Keep all follow-up visits as directed by your health care provider. This is important. This includes any further testing if your chest pain does not go away.  If heartburn is the cause for your chest pain, you may be told to keep your head raised (elevated) while sleeping. This reduces the chance that acid will go from your stomach into your esophagus.  Make lifestyle changes as directed by your health care provider. These may include:  Getting regular exercise. Ask your health care provider to suggest some activities that are safe for you.  Eating a heart-healthy diet. A registered dietitian can help you to learn healthy eating options.  Maintaining a healthy weight.  Managing diabetes, if necessary.  Reducing stress. SEEK MEDICAL CARE IF:  Your chest pain does not go away after treatment.  You have a rash with blisters on your chest.  You have a fever. SEEK IMMEDIATE MEDICAL CARE IF:   Your chest pain is worse.  You have an increasing cough, or you cough up blood.  You have severe abdominal pain.  You have severe weakness.  You faint.  You have chills.  You have sudden, unexplained chest discomfort.  You have sudden, unexplained discomfort in your arms, back, neck, or jaw.  You have shortness of breath at any time.  You suddenly start to sweat, or your skin gets clammy.  You feel nauseous or you vomit.  You suddenly feel light-headed or  dizzy.  Your heart begins to beat quickly, or it feels like it is skipping beats. These symptoms may represent a serious problem that is an emergency. Do not wait to see if the symptoms will go away. Get medical help right away. Call your local emergency services (911 in the [place] Do not drive yourself to the hospital.   This information is not intended to replace advice given to you by your health care provider. Make sure you discuss any questions you have with your health care provider.   Document Released: 09/03/2005 Document Revised: 12/15/2014 Document Reviewed: 06/30/2014 Elsevier Interactive Patient Education [employer].

## 2015-12-11 ENCOUNTER — Telehealth: Payer: Self-pay | Admitting: Cardiovascular Disease

## 2015-12-11 DIAGNOSIS — I25118 Atherosclerotic heart disease of native coronary artery with other forms of angina pectoris: Secondary | ICD-10-CM

## 2015-12-11 NOTE — Telephone Encounter (Signed)
I spoke with the pt and he would like Dr Excell Seltzer to review his ER records.  The pt said he still has some off and on chest pressure since his ER visit.  The pt has a pending appointment on 12/18/2015 with our office.

## 2015-12-11 NOTE — Telephone Encounter (Signed)
New Message   Pt requested to speak w/ RN concerning recent hospital stay- per AVS- Call Your cardiologist, Dr. Excell Seltzer in 1 day(s)-- appt was made for 12/18/15 @ 815 w/ Marcelino Duster. Please call back and discuss.

## 2015-12-12 NOTE — Telephone Encounter (Signed)
Pt scheduled for Myoview on 12/17/15.  I will arrange a follow-up appointment with provider once this test is complete. Due to a scheduling error the pt's appointment for 12/18/2015 was cancelled.

## 2015-12-12 NOTE — Telephone Encounter (Signed)
ER records reviewed. The patient's known CAD and LAD/diagonal bifurcation and inability to exercise with multiple sclerosis, would add a Lexiscan Myoview stress test prior to upcoming visit. thx

## 2015-12-12 NOTE — Telephone Encounter (Signed)
Order placed for Myoview.

## 2015-12-13 ENCOUNTER — Ambulatory Visit: Payer: Self-pay | Admitting: Cardiovascular Disease

## 2015-12-13 ENCOUNTER — Telehealth (HOSPITAL_COMMUNITY): Payer: Self-pay | Admitting: *Deleted

## 2015-12-13 NOTE — Telephone Encounter (Signed)
Left message on voicemail per DPR in reference to upcoming appointment scheduled on 12/17/15 at 730(Left message to come in at 1030 due to weather) with detailed instructions given per Myocardial Perfusion Study Information Sheet for the test. LM to arrive 15 minutes early, and that it is imperative to arrive on time for appointment to keep from having the test rescheduled. If you need to cancel or reschedule your appointment, please call the office within 24 hours of your appointment. Failure to do so may result in a cancellation of your appointment, and a $50 no show fee. Phone number given for call back for any questions. Luanna Cole Ewan Grau,RN

## 2015-12-14 ENCOUNTER — Ambulatory Visit (INDEPENDENT_AMBULATORY_CARE_PROVIDER_SITE_OTHER): Payer: Managed Care, Other (non HMO) | Admitting: Psychology

## 2015-12-14 DIAGNOSIS — F4323 Adjustment disorder with mixed anxiety and depressed mood: Secondary | ICD-10-CM | POA: Diagnosis not present

## 2015-12-17 ENCOUNTER — Encounter (HOSPITAL_COMMUNITY): Payer: Self-pay

## 2015-12-18 ENCOUNTER — Ambulatory Visit: Payer: Self-pay | Admitting: Physician Assistant

## 2015-12-18 ENCOUNTER — Encounter: Payer: Self-pay | Admitting: Cardiovascular Disease

## 2015-12-18 NOTE — Telephone Encounter (Signed)
Myoview rescheduled to 12/25/15 due to inclement weather.

## 2015-12-18 NOTE — Telephone Encounter (Signed)
This encounter was created in error - please disregard.

## 2015-12-18 NOTE — Telephone Encounter (Signed)
New message      Pt is calling to see if he needs to move his 12-25-15 stress test appt up.  He states that his symptoms are more frequent.  Please call

## 2015-12-18 NOTE — Telephone Encounter (Signed)
Myoview moved up to 12/20/15 due to pt continuing to have symptoms.

## 2015-12-19 ENCOUNTER — Telehealth (HOSPITAL_COMMUNITY): Payer: Self-pay | Admitting: *Deleted

## 2015-12-19 NOTE — Telephone Encounter (Signed)
Patient given detailed instructions per Myocardial Perfusion Study Information Sheet for the test on 12/20/15 at 7:30. Patient notified to arrive 15 minutes early and that it is imperative to arrive on time for appointment to keep from having the test rescheduled.  If you need to cancel or reschedule your appointment, please call the office within 24 hours of your appointment. Failure to do so may result in a cancellation of your appointment, and a $50 no show fee. Patient verbalized understanding.Daneil Dolin

## 2015-12-20 ENCOUNTER — Ambulatory Visit (HOSPITAL_COMMUNITY): Payer: Managed Care, Other (non HMO) | Attending: Cardiovascular Disease

## 2015-12-20 VITALS — Ht 75.0 in | Wt 287.0 lb

## 2015-12-20 DIAGNOSIS — I10 Essential (primary) hypertension: Secondary | ICD-10-CM | POA: Diagnosis not present

## 2015-12-20 DIAGNOSIS — I25118 Atherosclerotic heart disease of native coronary artery with other forms of angina pectoris: Secondary | ICD-10-CM

## 2015-12-20 DIAGNOSIS — R9439 Abnormal result of other cardiovascular function study: Secondary | ICD-10-CM | POA: Diagnosis not present

## 2015-12-20 DIAGNOSIS — R079 Chest pain, unspecified: Secondary | ICD-10-CM | POA: Diagnosis not present

## 2015-12-20 DIAGNOSIS — R0609 Other forms of dyspnea: Secondary | ICD-10-CM | POA: Diagnosis not present

## 2015-12-20 LAB — MYOCARDIAL PERFUSION IMAGING
LV dias vol: 161 mL
LV sys vol: 66 mL
Peak HR: 121 {beats}/min
RATE: 0.27
Rest HR: 93 {beats}/min
SDS: 3
SRS: 1
SSS: 4
TID: 1.23

## 2015-12-20 MED ORDER — TECHNETIUM TC 99M SESTAMIBI GENERIC - CARDIOLITE
10.7000 | Freq: Once | INTRAVENOUS | Status: AC | PRN
  Administered 2015-12-20: 11 via INTRAVENOUS

## 2015-12-20 MED ORDER — AMINOPHYLLINE 25 MG/ML IV SOLN
150.0000 mg | Freq: Once | INTRAVENOUS | Status: AC
  Administered 2015-12-20: 150 mg via INTRAVENOUS

## 2015-12-20 MED ORDER — REGADENOSON 0.4 MG/5ML IV SOLN
0.4000 mg | Freq: Once | INTRAVENOUS | Status: AC
  Administered 2015-12-20: 0.4 mg via INTRAVENOUS

## 2015-12-20 MED ORDER — TECHNETIUM TC 99M SESTAMIBI GENERIC - CARDIOLITE
33.0000 | Freq: Once | INTRAVENOUS | Status: AC | PRN
  Administered 2015-12-20: 33 via INTRAVENOUS

## 2015-12-20 NOTE — Telephone Encounter (Signed)
Notes Recorded by Tonny Bollman, MD on 12/20/2015 at 2:06 PM Prior infarct noted without significant area of ischemia. Reviewed results with patient. He's had one further episode of chest discomfort. Does not feel like his previous cardiac pain. Will continue current medical program and if progressive symptoms he will notify us. Suspect his pain is musculoskeletal.    Dr Excell Seltzer contacted the pt by phone and made him aware of test.  No follow-up appointment required at this time.

## 2015-12-24 ENCOUNTER — Encounter: Payer: Self-pay | Admitting: Neurology

## 2015-12-24 ENCOUNTER — Ambulatory Visit (INDEPENDENT_AMBULATORY_CARE_PROVIDER_SITE_OTHER): Payer: Managed Care, Other (non HMO) | Admitting: Neurology

## 2015-12-24 VITALS — BP 130/78 | HR 110 | Resp 16 | Ht 75.0 in | Wt 271.0 lb

## 2015-12-24 DIAGNOSIS — R3915 Urgency of urination: Secondary | ICD-10-CM | POA: Diagnosis not present

## 2015-12-24 DIAGNOSIS — F418 Other specified anxiety disorders: Secondary | ICD-10-CM

## 2015-12-24 DIAGNOSIS — G4700 Insomnia, unspecified: Secondary | ICD-10-CM | POA: Insufficient documentation

## 2015-12-24 DIAGNOSIS — R258 Other abnormal involuntary movements: Secondary | ICD-10-CM | POA: Diagnosis not present

## 2015-12-24 DIAGNOSIS — R252 Cramp and spasm: Secondary | ICD-10-CM

## 2015-12-24 DIAGNOSIS — G35 Multiple sclerosis: Secondary | ICD-10-CM | POA: Diagnosis not present

## 2015-12-24 DIAGNOSIS — R269 Unspecified abnormalities of gait and mobility: Secondary | ICD-10-CM | POA: Diagnosis not present

## 2015-12-24 MED ORDER — LORAZEPAM 1 MG PO TABS: ORAL_TABLET | ORAL | Status: DC

## 2015-12-24 MED ORDER — GABAPENTIN 300 MG PO CAPS: ORAL_CAPSULE | ORAL | Status: DC

## 2015-12-24 NOTE — Progress Notes (Signed)
[facility]  PATIENT: Keith Morrow DOB: 03-19-1971  REFERRING DOCTOR OR [name] SOURCE: Patient, MRI of the cervical spine in PACS  _________________________________   HISTORICAL  CHIEF COMPLAINT:  Chief Complaint  Patient presents with  . Multiple Sclerosis    Former pt. of Dr. Bonnita Hollow from [facility]. Dx. [id number].  Presenting sx. were numbness in both feet.  In 1997 he had optic neuritis left eye.  He was living in [place], [place] at the time.  Dx. was confirmed with MRI and LP.  Doesn't recall the name of the first neurologist he saw--he first started Copaxone.  He was noncompliant with this med.  5 yrs. later he switched to Avonex but stopped this after a yr. due to side effects (flu-like sx.)  He then started Rebif but stopped 3 mos. later due to   . Gait Disturbance    painful inj.  He then started Tysabri in 2004 and was on thei for about 2 mos--then offi of it for about 6 mos., then, with the exception of a 6 month holiday, he has been on Tysabri and tolerated it well.  His last infusion was today.  He has never tested positive for JCV ab.  He is here today to discuss other tx. options, as his walking has become worse over the last several yrs.  He has a Baclofen pump./fim    HISTORY OF PRESENT ILLNESS:  Keith Morrow is a former 45 year old man with multiple sclerosis who I have previously seen at [facility].  MS history: In 1997, he had left optic neuritis and then had numbness in both feet. MRI was consistent with MS and a lumbar puncture was also performed and CSF was consistent with MS. Initially, he was placed on Copaxone. He had difficulties with compliance and 5 years later switched to Avonex. Unfortunate, he had difficulties tolerating Avonex and also had difficulty tolerating Rebif. In 2004, he started Tysabri but was only able to take 2 doses before was taken off the market. He restarted in 2005 and has been on Tysabri  since then with the exception of a 6 month holiday. Tolerates Tysabri well. He has not had any definite exacerbations though his gait has worsened over the past few years. He is JCV antibody negative.   He had recent MRI of the brain and spine and there were no changes.     I was able to review an MRI of the cervical spine from 09/10/2012 and it shows plaques within the spinal cord adjacent to C2, C3-C4, C5 and C6.  Gait/strength/sensation:   He has had difficulty with gait since the mid 2000's and started using a cane due to right spasticity and weakness around 2006.   He went to two canes in 2014 and has started using a walker this month.    In early 2006, he had an exacerbation with weakness in his right hand.    He has had more trouble writing and typing with the right hand.    He went on a baclofen pump in 2015 and dose continues to go up.    He has had many dose increases since October 2016 (was under 1000 mcg at that time) but he is getting less of a benefit.  A pump dye test showed that the pump was working.   He is currently on baclofen 1600 mcg.   Spasticity is worse during the night.  His right leg may lock up at night.  Tizanidine pills caused lethargy and did not help much but he sleeps better with it.   He also takes lorazepam at bedtime.       He has dysesthesias across his chest with intermittent severe tight sensation.  He gets left leg numbness at times, especially driving.   Ampyra helped the gait some when he started.  Bladder/bowel:   He was having frequency, better when he drinks less.   If he does not get to bathroom within 10 minutes or urge, he gets incontinence.    He has constipation.   He has ED, not always helped by Viagra.   He has difficulty with ejaculation.     Vision:   He had ON in the 1990's but has no current problems with vision  Fatigue:   He has fatigue and sleepiness, helped by Ritalin.    Sleep:   He has sleep maintenance > sleep onset insomia.   He often wakes  up with spasticity around 1 or 2 in the morning. He takes lorazepam at night to help relax at bedtime.  Mood/cognition:   Cognition is fine.   He has had more depression as his gait has worsened the past year.    He gets occasional anxiety.    REVIEW OF SYSTEMS: Constitutional: No fevers, chills, sweats, or change in appetite Eyes: No visual changes, double vision, eye pain Ear, nose and throat: No hearing loss, ear pain, nasal congestion, sore throat Cardiovascular: No chest pain, palpitations Respiratory: No shortness of breath at rest or with exertion.   No wheezes GastrointestinaI: No nausea, vomiting, diarrhea, abdominal pain, fecal incontinence Genitourinary: No dysuria, urinary retention or frequency.  No nocturia. Musculoskeletal: No neck pain, back pain Integumentary: No rash, pruritus, skin lesions Neurological: as above Psychiatric: No depression at this time.  No anxiety Endocrine: No palpitations, diaphoresis, change in appetite, change in weigh or increased thirst Hematologic/Lymphatic: No anemia, purpura, petechiae. Allergic/Immunologic: No itchy/runny eyes, nasal congestion, recent allergic reactions, rashes  ALLERGIES: No Known Allergies  HOME MEDICATIONS:  Current outpatient prescriptions:  .  aspirin EC 81 MG tablet, Take 81 mg by mouth daily., Disp: , Rfl:  .  atorvastatin (LIPITOR) 40 MG tablet, Take 1 tablet (40 mg total) by mouth daily., Disp: 90 tablet, Rfl: 3 .  BACLOFEN IT, by Intrathecal route., Disp: , Rfl:  .  cetirizine (ZYRTEC) 10 MG tablet, Take 10 mg by mouth daily as needed for allergies. , Disp: , Rfl:  .  cyclobenzaprine (FLEXERIL) 10 MG tablet, Take 10 mg by mouth 3 (three) times daily as needed for muscle spasms., Disp: , Rfl:  .  Dalfampridine (AMPYRA) 10 MG TB12, Take 10 mg by mouth 2 (two) times daily. Every 12 hours, Disp: , Rfl:  .  DULoxetine (CYMBALTA) 60 MG capsule, Take 60 mg by mouth 2 (two) times daily., Disp: , Rfl:  .   furosemide (LASIX) 20 MG tablet, Take 1 tablet (20 mg total) by mouth daily., Disp: 30 tablet, Rfl: 3 .  ibuprofen (ADVIL,MOTRIN) 200 MG tablet, Take 200 mg by mouth every 6 (six) hours as needed for moderate pain., Disp: , Rfl:  .  LORazepam (ATIVAN) 1 MG tablet, Take 1 tablet (1 mg total) by mouth 2 (two) times daily as needed for anxiety., Disp: 40 tablet, Rfl: 2 .  methylphenidate (RITALIN) 10 MG tablet, Take 10 mg by mouth See admin instructions. Takes daily, can take addt'l tab as needed, Disp: , Rfl:  .  nitroGLYCERIN (NITROSTAT) 0.4 MG  SL tablet, Place 0.4 mg under the tongue every 5 (five) minutes as needed for chest pain (x 3 doses). , Disp: , Rfl:  .  olmesartan (BENICAR) 20 MG tablet, Take 1 tablet (20 mg total) by mouth daily., Disp: 90 tablet, Rfl: 2 .  sildenafil (VIAGRA) 100 MG tablet, Take 100 mg by mouth daily as needed for erectile dysfunction., Disp: , Rfl:  .  SYNTHROID 150 MCG tablet, TAKE ONE TABLET BY MOUTH DAILY BEFORE BREAKFAST, Disp: 30 tablet, Rfl: 6 .  testosterone cypionate (DEPOTESTOSTERONE CYPIONATE) 200 MG/ML injection, Inject 240 mg into the muscle every 7 (seven) days. Inject 1.2 mL into the muscle every 7 days, Disp: , Rfl:  .  baclofen (LIORESAL) 20 MG tablet, Take 20 mg by mouth 3 (three) times daily. Reported on 12/24/2015, Disp: , Rfl:   PAST MEDICAL HISTORY: Past Medical History  Diagnosis Date  . HTN (hypertension)   . Hypothyroidism   . Hyperlipidemia   . MS (multiple sclerosis) (HCC)   . Coronary atherosclerosis of native coronary artery     a. NSTEMI 04/20/11: tx with Promus DES to LAD; bifurcation lesion with oDx 90% tx with POBA; b. staged PCI of right post. AV Branch with Promus DES; c. residual at cath 04/21/11:  AV groove CFX 50%, mRCA 30%,; EF 55%  . Chicken pox as a child  . Heart attack (HCC) 04-20-11  . Allergy   . Obesity   . Depression   . Onychomycosis   . Tinea pedis   . Keloid scar   . Preventative health care 10/07/2011  . Reflux  10/07/2011  . 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 Hypotestosteronism 11/17/2011  . Dermatitis, contact 12/10/2011  . Bronchitis, acute 12/10/2011  . Testosterone deficiency 01/16/2012  . Fatigue 10/15/2012  . Acute bronchitis 05/09/2013  . Neuromuscular disorder (HCC)     NUMBNESS/TINGLING  . Depression with anxiety     PAST SURGICAL HISTORY: Past Surgical History  Procedure Laterality Date  . Right wrist surgery      CTR  . Vasectomy    . Vascular surgery      vericose vein in right femoral area  . Wisdom tooth extraction    . Coronary angioplasty with stent placement      2012  . Pain pump implantation N/A 08/29/2014    Procedure: Baclofen pump placement;  Surgeon: Maeola Harman, MD;  Location: [facility];  Service: Neurosurgery;  Laterality: N/A;  Baclofen pump placement  . Programable baclofen pump revision  08/29/14    FAMILY HISTORY: Family History  Problem Relation Age of Onset  . Coronary artery disease      questionable in father  . Heart attack Mother 85  . Thyroid disease Mother   . Heart disease Mother   . Thyroid disease Sister   . Thyroid disease Brother   . Heart disease Maternal Grandmother   . Hypertension Maternal Grandmother   . Hyperlipidemia Maternal Grandmother   . Heart disease Maternal Grandfather   . Hypertension Maternal Grandfather   . Hyperlipidemia Maternal Grandfather   . Stroke Paternal Grandfather   . Thyroid disease Sister   . Heart disease Father     2 stents    SOCIAL HISTORY:  Social History   Social History  . Marital Status: Married    Spouse Name: N/A  . Number of Children: 3  . Years of Education: N/A   Occupational History  . Not on file.   Social History  Main Topics  . Smoking status: Never Smoker   . Smokeless tobacco: Never Used  . Alcohol Use: Yes     Comment: socially  . Drug Use: No  . Sexual Activity:    Partners: Female     Comment: 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, works Engineer, maintenance, no dietary  restrictions   Other Topics Concern  . Not on file   Social History Narrative     PHYSICAL EXAM  Filed Vitals:   12/24/15 1403  BP: 130/78  Pulse: 110  Resp: 16  Height: 6\' 3"  (1.905 m)  Weight: 271 lb (122.925 kg)    Body mass index is 33.87 kg/(m^2).   General: The patient is well-developed and well-nourished and in no acute distress  Skin: Extremities show edema at ankles, worse on the right.  Musculoskeletal:  Back is nontender  Neurologic Exam  Mental status: The patient is alert and oriented x 3 at the time of the examination. The patient has apparent normal recent and remote memory, with an apparently normal attention span and concentration ability.   Speech is normal.  Cranial nerves: Extraocular movements are full. Pupils are equal, round, and reactive to light and accomodation.    Facial symmetry is present. There is good facial sensation to soft touch bilaterally.Facial strength is normal.  Trapezius and sternocleidomastoid strength is normal. No dysarthria is noted.  The tongue is midline, and the patient has symmetric elevation of the soft palate. No obvious hearing deficits are noted.  Motor:  Muscle bulk is normal.   Tone is increased in right arm and leg. Strength is  4+ / 5 in the right arm, 5/5 in the left arm, 2/5 in the right leg 3 to 4-/5 on the left.   Sensory: Sensory testing is intact to pinprick, soft touch and vibration sensation in arms, asymmetric vibratory sensation in legs.  Coordination: Cerebellar testing reveals normal left near-normal right finger-nose-finger.  Rapid alternating movements are mildly slower in the right arm.  Gait and station: He needs unilateral support to stand and bilateral support to walk. Right is reduced..   Reflexes: Deep tendon reflexes are 1 and symmetric bilaterally.        DIAGNOSTIC DATA (LABS, IMAGING, TESTING) - I reviewed patient records, labs, notes, testing and imaging myself where available.  Lab  Results  Component Value Date   WBC 8.9 12/07/2015   HGB 14.0 12/07/2015   HCT 43.7 12/07/2015   MCV 89.0 12/07/2015   PLT 204 12/07/2015      Component Value Date/Time   NA 139 12/07/2015 1617   K 4.1 12/07/2015 1617   CL 104 12/07/2015 1617   CO2 25 12/07/2015 1617   GLUCOSE 93 12/07/2015 1617   BUN 12 12/07/2015 1617   CREATININE 0.93 12/07/2015 1617   CREATININE 0.80 01/05/2015 1544   CALCIUM 8.9 12/07/2015 1617   PROT 6.4 10/10/2015 1453   ALBUMIN 3.9 10/10/2015 1453   AST 22 10/10/2015 1453   ALT 24 10/10/2015 1453   ALKPHOS 51 10/10/2015 1453   BILITOT 0.5 10/10/2015 1453   GFRNONAA >60 12/07/2015 1617   GFRAA >60 12/07/2015 1617   Lab Results  Component Value Date   CHOL 151 07/23/2015   HDL 58.70 07/23/2015   LDLCALC 83 07/23/2015   TRIG 47.0 07/23/2015   CHOLHDL 3 07/23/2015   No results found for: HGBA1C Lab Results  Component Value Date   VITAMINB12 518 07/08/2012   Lab Results  Component Value Date  TSH 0.18* 10/10/2015       ASSESSMENT AND PLAN  MS (multiple sclerosis) (HCC)  Gait disturbance  Spasticity  Depression with anxiety  Urinary urgency  Insomnia   In summary, Mr. Pepper is a 45 year old man with multiple sclerosis. 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 He has a baclofen pump but has not received much benefit as the dose has increased over the past 6 months. He is most concerned about the spasticity and he has needed to start using a walker recently.   I think he might benefit from a combination of baclofen and clonidine in the intrathecal pump. Because he has had more weakness in the left leg (his leg), I'm also concerned that the baclofen dose may be too high. I will reduce him from 1600 to about 1000 g a day (33 g per hour continuous dose with a 200 g bolus at 1 AM) we will order the compounded baclofen clonidine mixture at 3600 g per ml baclofen and 360 g per ml  clonidine and he will return later this week to exchange the pump medications.  I'll also have him increase the lorazepam dosage to control spasticity. Additionally, because of the dysesthesias we will start gabapentin. We also had a long conversation about his disease modifying therapy. I do believe he has received significant benefit from Tysabri over the years but the benefit seems to be less more recently and we may want to consider either Lemtrada, Rituxan or ocrelizumab (if available this year).  He will think more about this but will want to do at least one more dose of Tysabri as he would like to get his spasticity and weakness issues under better control before switching.   We will try to get some records from [facility] including his most recent MRIs.  He will return in 1 month for his next infusion or sooner if there are new or worsening neurologic symptoms.  65 minute face-to-face evaluation with greater than one half of the time counseling and coordinating care about his multiple sclerosis and spasticity issues.   Ramiz Turpin A. Epimenio Foot, MD, PhD 12/24/2015, 2:09 PM Certified in Neurology, Clinical Neurophysiology, Sleep Medicine, Pain Medicine and Neuroimaging  [facility] [address] [place], [place] [address] [phone]

## 2015-12-25 ENCOUNTER — Encounter (HOSPITAL_COMMUNITY): Payer: Self-pay

## 2015-12-26 ENCOUNTER — Encounter: Payer: Self-pay | Admitting: *Deleted

## 2015-12-28 ENCOUNTER — Encounter: Payer: Self-pay | Admitting: Neurology

## 2015-12-28 ENCOUNTER — Ambulatory Visit (INDEPENDENT_AMBULATORY_CARE_PROVIDER_SITE_OTHER): Payer: Managed Care, Other (non HMO) | Admitting: Neurology

## 2015-12-28 VITALS — BP 128/94 | HR 74 | Resp 16 | Ht 75.0 in | Wt 271.0 lb

## 2015-12-28 DIAGNOSIS — R258 Other abnormal involuntary movements: Secondary | ICD-10-CM

## 2015-12-28 DIAGNOSIS — R252 Cramp and spasm: Secondary | ICD-10-CM

## 2015-12-28 DIAGNOSIS — G35 Multiple sclerosis: Secondary | ICD-10-CM | POA: Diagnosis not present

## 2015-12-30 ENCOUNTER — Encounter: Payer: Self-pay | Admitting: Neurology

## 2015-12-30 NOTE — Progress Notes (Signed)
HPI    Multiple Sclerosis   Additional comments: Here for IT pump refill.  Pump interrogated.  Skin cleaned with Betadine.  Pump accessed with 22g non-coring needle, without difficulty.  53ml clear fluid withdrawn.  75ml of pump med obtained from [facility] instilled into pump.  Pump reprogrammed to reflect new Baclofen concentration of 3,659mcg/ml, and additional med has been added--Clonidine, 360mg /ml.  Pt. reports improvement with pump changes made at last ov (40% decrease in daily dose of Baclofen)--sts. left leg is now stronger; he can pick it up to         Spasticity   Additional comments: walk.  Per [facility] v/o, pump reprogrammed today--basal rate of 46mcg/hr with one daily dose of at 0100.  Total daily dose of Baclofen is now 789. and total daily dose of Clonidine is 78.94mg .  Bridge bolus will last 47.5 hrs.  AD is 06-18-16/fim     Last edited by Keith Pedro, RN on 12/28/2015 12:11 PM. (History)      HISTORY:  He notes improvement in leg strength after reducing the baclofen by 40% earlier this week. He is able to lift his legs up better. He is able to walk a little further without stopping. However, he continues to have a fair amount of spasticity, worse at night. Sometimes the spasms will wake him up in the middle of the night or make it difficult for him to fall back asleep.   PHYSICAL EXAM  General: The patient is well-developed and well-nourished and in no acute distress  Neurologic Exam  Mental status: The patient is alert and oriented x 3 at the time of the examination.  Speech is normal.  Motor: Muscle bulk is normal. Tone is increased in right arm and leg. Strength is 4+ / 5 in the right arm, 5/5 in the left arm, 2+-3/5 in the right leg 4-/5 on the left.   Gait and station: He needs unilateral support to stand and walk.    Reflexes: Deep tendon reflexes are 1 and symmetric bilaterally.   ASSESSMENT  MS (multiple sclerosis)  (HCC)  Spasticity    PLAN: 1.   Refill pump with baclofen 3600 mg/ml and clonidine 360 mg/ml 2.   Program pump for 30 mcg/hr baclofen daily and 3 mcg/hr clonidine daily with additional 60 g bolus at 1 AM 3. Return as scheduled or sooner if there are new or worsening neurologic issues.  Depending on results of clonidine addition, we can make further adjustments if needed.  Keith Morrow A. Keith Foot, MD, PhD Certified in Neurology, Clinical Neurophysiology, Sleep Medicine, Pain Medicine and Neuroimaging  [facility] [address] [place], [place] [address] [phone]

## 2016-01-02 ENCOUNTER — Telehealth: Payer: Self-pay | Admitting: Neurology

## 2016-01-02 NOTE — Telephone Encounter (Signed)
I have texted [name] to advise [facility] will take over Keith Morrow's pump--I actually filled it last Friday--decreased his Baclofen and added Clonidine per [facility] v/o.  This has helped/fim

## 2016-01-02 NOTE — Telephone Encounter (Signed)
Keith Morrow with [facility] called said baclofen alarm date is Feb 15. She said if [facility] is going to follow pt to pls text her and let her know at [phone]. FYI only

## 2016-01-04 ENCOUNTER — Ambulatory Visit (INDEPENDENT_AMBULATORY_CARE_PROVIDER_SITE_OTHER): Payer: Managed Care, Other (non HMO) | Admitting: Psychology

## 2016-01-04 DIAGNOSIS — F4323 Adjustment disorder with mixed anxiety and depressed mood: Secondary | ICD-10-CM | POA: Diagnosis not present

## 2016-01-08 ENCOUNTER — Encounter: Payer: Self-pay | Admitting: *Deleted

## 2016-01-12 ENCOUNTER — Encounter: Payer: Self-pay | Admitting: Neurology

## 2016-01-14 ENCOUNTER — Other Ambulatory Visit: Payer: Self-pay | Admitting: Family Medicine

## 2016-01-25 ENCOUNTER — Ambulatory Visit (INDEPENDENT_AMBULATORY_CARE_PROVIDER_SITE_OTHER): Payer: Managed Care, Other (non HMO) | Admitting: Psychology

## 2016-01-25 ENCOUNTER — Other Ambulatory Visit: Payer: Self-pay | Admitting: Neurology

## 2016-01-25 DIAGNOSIS — F4323 Adjustment disorder with mixed anxiety and depressed mood: Secondary | ICD-10-CM

## 2016-01-25 MED ORDER — METHYLPHENIDATE HCL 10 MG PO TABS: ORAL_TABLET | ORAL | Status: DC

## 2016-02-04 ENCOUNTER — Ambulatory Visit: Payer: Self-pay | Admitting: Family Medicine

## 2016-02-05 ENCOUNTER — Encounter: Payer: Self-pay | Admitting: *Deleted

## 2016-02-06 ENCOUNTER — Encounter: Payer: Self-pay | Admitting: Family Medicine

## 2016-02-06 ENCOUNTER — Encounter: Payer: Self-pay | Admitting: Neurology

## 2016-02-08 ENCOUNTER — Ambulatory Visit (INDEPENDENT_AMBULATORY_CARE_PROVIDER_SITE_OTHER): Payer: Managed Care, Other (non HMO) | Admitting: Psychology

## 2016-02-08 ENCOUNTER — Telehealth: Payer: Self-pay

## 2016-02-08 ENCOUNTER — Telehealth: Payer: Self-pay | Admitting: *Deleted

## 2016-02-08 DIAGNOSIS — F4323 Adjustment disorder with mixed anxiety and depressed mood: Secondary | ICD-10-CM | POA: Diagnosis not present

## 2016-02-08 MED ORDER — TAMSULOSIN HCL 0.4 MG PO CAPS: 0.4000 mg | ORAL_CAPSULE | Freq: Every day | ORAL | Status: DC

## 2016-02-08 NOTE — Telephone Encounter (Signed)
Left message for patient to return call regarding Pre visit information. 

## 2016-02-08 NOTE — Telephone Encounter (Signed)
See pt's email from 02-06-16.  He is experiencing some urinary hesitancy.  Per [facility], this may be due to the MS or the Baclofen.  Can try Flomax 0.4mg  daily.  Keith Morrow is agreeable with this. Rx. escribed to [facility] per his request/fim

## 2016-02-11 ENCOUNTER — Ambulatory Visit (INDEPENDENT_AMBULATORY_CARE_PROVIDER_SITE_OTHER): Payer: Managed Care, Other (non HMO) | Admitting: Family Medicine

## 2016-02-11 ENCOUNTER — Encounter: Payer: Self-pay | Admitting: Family Medicine

## 2016-02-11 VITALS — BP 122/78 | HR 101 | Temp 97.6°F | Wt 281.5 lb

## 2016-02-11 DIAGNOSIS — R7989 Other specified abnormal findings of blood chemistry: Secondary | ICD-10-CM

## 2016-02-11 DIAGNOSIS — R269 Unspecified abnormalities of gait and mobility: Secondary | ICD-10-CM | POA: Diagnosis not present

## 2016-02-11 DIAGNOSIS — E785 Hyperlipidemia, unspecified: Secondary | ICD-10-CM | POA: Diagnosis not present

## 2016-02-11 DIAGNOSIS — R32 Unspecified urinary incontinence: Secondary | ICD-10-CM

## 2016-02-11 DIAGNOSIS — G35 Multiple sclerosis: Secondary | ICD-10-CM

## 2016-02-11 DIAGNOSIS — I10 Essential (primary) hypertension: Secondary | ICD-10-CM | POA: Diagnosis not present

## 2016-02-11 DIAGNOSIS — E039 Hypothyroidism, unspecified: Secondary | ICD-10-CM | POA: Diagnosis not present

## 2016-02-11 DIAGNOSIS — R3915 Urgency of urination: Secondary | ICD-10-CM | POA: Diagnosis not present

## 2016-02-11 DIAGNOSIS — N529 Male erectile dysfunction, unspecified: Secondary | ICD-10-CM

## 2016-02-11 DIAGNOSIS — L989 Disorder of the skin and subcutaneous tissue, unspecified: Secondary | ICD-10-CM

## 2016-02-11 DIAGNOSIS — E349 Endocrine disorder, unspecified: Secondary | ICD-10-CM

## 2016-02-11 DIAGNOSIS — E291 Testicular hypofunction: Secondary | ICD-10-CM | POA: Diagnosis not present

## 2016-02-11 DIAGNOSIS — R3911 Hesitancy of micturition: Secondary | ICD-10-CM

## 2016-02-11 DIAGNOSIS — Z0000 Encounter for general adult medical examination without abnormal findings: Secondary | ICD-10-CM | POA: Diagnosis not present

## 2016-02-11 DIAGNOSIS — L578 Other skin changes due to chronic exposure to nonionizing radiation: Secondary | ICD-10-CM | POA: Diagnosis not present

## 2016-02-11 MED ORDER — CLOBETASOL PROPIONATE 0.05 % EX CREA: 1.0000 "application " | TOPICAL_CREAM | Freq: Two times a day (BID) | CUTANEOUS | Status: DC

## 2016-02-11 MED ORDER — DULOXETINE HCL 60 MG PO CPEP: 60.0000 mg | ORAL_CAPSULE | Freq: Two times a day (BID) | ORAL | Status: DC

## 2016-02-11 MED ORDER — TESTOSTERONE CYPIONATE 200 MG/ML IM SOLN: 240.0000 mg | INTRAMUSCULAR | Status: DC

## 2016-02-11 NOTE — Progress Notes (Signed)
Pre visit review using our clinic review tool, if applicable. No additional management support is needed unless otherwise documented below in the visit note. 

## 2016-02-11 NOTE — Progress Notes (Addendum)
Patient ID: Keith Morrow, male   DOB: [birth year], 45 y.o.   MRN: [id number]   Subjective:    Patient ID: Keith Morrow, male    DOB: [birth year], 45 y.o.   MRN: [id number]  Chief Complaint  Patient presents with  . Annual Exam    HPI Patient is in today for Annual Physical Exam.ontinues to work hard but his MS is worsening. He is weak and using a walker now. Has fallen less since using the walker but continues to fall some. No recent illness. Is endorsing some worsening urinary symptoms. Frequency and hesitancy are noted. No hematuria. Denies CP/palp/SOB/HA/congestion/fevers/GI c/oTaking meds as prescribed. He struggles with fatigue and anhedonia but denies any suicidal ideation  Past Medical History  Diagnosis Date  . HTN (hypertension)   . Hypothyroidism   . Hyperlipidemia   . MS (multiple sclerosis) (HCC)   . Coronary atherosclerosis of native coronary artery     a. NSTEMI 04/20/11: tx with Promus DES to LAD; bifurcation lesion with oDx 90% tx with POBA; b. staged PCI of right post. AV Branch with Promus DES; c. residual at cath 04/21/11:  AV groove CFX 50%, mRCA 30%,; EF 55%  . Chicken pox as a child  . Heart attack (HCC) 04-20-11  . Allergy   . Obesity   . Depression   . Onychomycosis   . Tinea pedis   . Keloid scar   . Preventative health care 10/07/2011  . Reflux 10/07/2011  . 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 Hypotestosteronism 11/17/2011  . Dermatitis, contact 12/10/2011  . Bronchitis, acute 12/10/2011  . Testosterone deficiency 01/16/2012  . Fatigue 10/15/2012  . Acute bronchitis 05/09/2013  . Neuromuscular disorder (HCC)     NUMBNESS/TINGLING  . Depression with anxiety   . Sun-damaged skin 02/17/2016    Past Surgical History  Procedure Laterality Date  . Right wrist surgery      CTR  . Vasectomy    . Vascular surgery      vericose vein in right femoral area  . Wisdom tooth extraction    . Coronary angioplasty with stent placement      2012  . Pain pump  implantation N/A 08/29/2014    Procedure: Baclofen pump placement;  Surgeon: Maeola Harman, MD;  Location: [facility];  Service: Neurosurgery;  Laterality: N/A;  Baclofen pump placement  . Programable baclofen pump revision  08/29/14    Family History  Problem Relation Age of Onset  . Coronary artery disease      questionable in father  . Heart attack Mother 49  . Thyroid disease Mother   . Heart disease Mother   . Thyroid disease Sister   . Thyroid disease Brother   . Heart disease Maternal Grandmother   . Hypertension Maternal Grandmother   . Hyperlipidemia Maternal Grandmother   . Heart disease Maternal Grandfather   . Hypertension Maternal Grandfather   . Hyperlipidemia Maternal Grandfather   . Stroke Paternal Grandfather   . Thyroid disease Sister   . Heart disease Father     2 stents    Social History   Social History  . Marital Status: Married    Spouse Name: N/A  . Number of Children: 3  . Years of Education: N/A   Occupational History  . Not on file.   Social History Main Topics  . Smoking status: Never Smoker   . Smokeless tobacco: Never Used  . Alcohol Use: Yes     Comment:  socially  . Drug Use: No  . 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 Not on file   Social History Narrative    Outpatient Prescriptions Prior to Visit  Medication Sig Dispense Refill  . aspirin EC 81 MG tablet Take 81 mg by mouth daily.    Marland Kitchen atorvastatin (LIPITOR) 40 MG tablet Take 1 tablet (40 mg total) by mouth daily. 90 tablet 3  . BACLOFEN IT by Intrathecal route.    . cetirizine (ZYRTEC) 10 MG tablet Take 10 mg by mouth daily as needed for allergies.     . cyclobenzaprine (FLEXERIL) 10 MG tablet Take 10 mg by mouth 3 (three) times daily as needed for muscle spasms.    . Dalfampridine (AMPYRA) 10 MG TB12 Take 10 mg by mouth 2 (two) times daily. Every 12 hours      . gabapentin (NEURONTIN) 300 MG capsule One po in am, one po in evening and two po qHS 120 capsule 11  . ibuprofen (ADVIL,MOTRIN) 200 MG tablet Take 200 mg by mouth every 6 (six) hours as needed for moderate pain.    Marland Kitchen LORazepam (ATIVAN) 1 MG tablet 4 times a day as needed 120 tablet 3  . methylphenidate (RITALIN) 10 MG tablet Take one pill po up to three times a day 90 tablet 0  . natalizumab (TYSABRI) 300 MG/15ML injection Inject 300 mg into the vein every 30 (thirty) days.    . nitroGLYCERIN (NITROSTAT) 0.4 MG SL tablet Place 0.4 mg under the tongue every 5 (five) minutes as needed for chest pain (x 3 doses).     . olmesartan (BENICAR) 20 MG tablet Take 1 tablet (20 mg total) by mouth daily. 90 tablet 2  . SYNTHROID 150 MCG tablet TAKE ONE TABLET BY MOUTH DAILY BEFORE BREAKFAST 30 tablet 6  . tamsulosin (FLOMAX) 0.4 MG CAPS capsule Take 1 capsule (0.4 mg total) by mouth daily. 30 capsule 3  . baclofen (LIORESAL) 20 MG tablet Take 20 mg by mouth 3 (three) times daily. Reported on 12/24/2015    . DULoxetine (CYMBALTA) 60 MG capsule Take 60 mg by mouth 2 (two) times daily.    . furosemide (LASIX) 20 MG tablet TAKE 1 TABLET (20 MG TOTAL) BY MOUTH DAILY. 30 tablet 3  . sildenafil (VIAGRA) 100 MG tablet Take 100 mg by mouth daily as needed for erectile dysfunction.    Marland Kitchen testosterone cypionate (DEPOTESTOSTERONE CYPIONATE) 200 MG/ML injection Inject 240 mg into the muscle every 7 (seven) days. Inject 1.2 mL into the muscle every 7 days. With all necessary supplies     No facility-administered medications prior to visit.    No Known Allergies  Review of Systems  Constitutional: Positive for malaise/fatigue. Negative for fever and chills.  HENT: Negative for congestion and hearing loss.   Eyes: Negative for discharge.  Respiratory: Negative for cough, sputum production and shortness of breath.   Cardiovascular: Negative for chest pain, palpitations and leg swelling.  Gastrointestinal: Negative  for heartburn, nausea, vomiting, abdominal pain, diarrhea, constipation and blood in stool.  Genitourinary: Positive for urgency and frequency. Negative for dysuria and hematuria.  Musculoskeletal: Positive for falls. Negative for myalgias and back pain.  Skin: Negative for rash.  Neurological: Positive for weakness. Negative for dizziness, sensory change, loss of consciousness and headaches.  Endo/Heme/Allergies: Negative for environmental allergies. Does not bruise/bleed easily.  Psychiatric/Behavioral:  Positive for depression. Negative for suicidal ideas. The patient is nervous/anxious. The patient does not have insomnia.        Objective:    Physical Exam  Constitutional: He is oriented to person, place, and time. He appears well-developed and well-nourished. No distress.  HENT:  Head: Normocephalic and atraumatic.  Eyes: Conjunctivae are normal.  Neck: Neck supple. No thyromegaly present.  Cardiovascular: Normal rate, regular rhythm and normal heart sounds.   No murmur heard. Pulmonary/Chest: Effort normal and breath sounds normal. No respiratory distress. He has no wheezes.  Abdominal: Soft. Bowel sounds are normal. He exhibits no mass. There is no tenderness.  Musculoskeletal: He exhibits no edema.  Unsteady gait, using walker  Lymphadenopathy:    He has no cervical adenopathy.  Neurological: He is alert and oriented to person, place, and time.  Skin: Skin is warm and dry.  Psychiatric: He has a normal mood and affect. His behavior is normal.    BP 122/78 mmHg  Pulse 101  Temp(Src) 97.6 F (36.4 C) (Oral)  Wt 281 lb 8 oz (127.688 kg)  SpO2 96% Wt Readings from Last 3 Encounters:  02/11/16 281 lb 8 oz (127.688 kg)  12/28/15 271 lb (122.925 kg)  12/24/15 271 lb (122.925 kg)     Lab Results  Component Value Date   WBC 9.7 02/11/2016   HGB 14.7 02/11/2016   HCT 45.4 02/11/2016   PLT 244.0 02/11/2016   GLUCOSE 81 02/11/2016   CHOL 162 02/11/2016   TRIG 52.0  02/11/2016   HDL 63.40 02/11/2016   LDLCALC 88 02/11/2016   ALT 22 02/11/2016   AST 21 02/11/2016   NA 140 02/11/2016   K 4.1 02/11/2016   CL 101 02/11/2016   CREATININE 0.86 02/11/2016   BUN 14 02/11/2016   CO2 29 02/11/2016   TSH 1.04 02/11/2016   PSA 0.52 02/11/2016   INR 1.00 04/21/2011    Lab Results  Component Value Date   TSH 1.04 02/11/2016   Lab Results  Component Value Date   WBC 9.7 02/11/2016   HGB 14.7 02/11/2016   HCT 45.4 02/11/2016   MCV 82.9 02/11/2016   PLT 244.0 02/11/2016   Lab Results  Component Value Date   NA 140 02/11/2016   K 4.1 02/11/2016   CO2 29 02/11/2016   GLUCOSE 81 02/11/2016   BUN 14 02/11/2016   CREATININE 0.86 02/11/2016   BILITOT 0.6 02/11/2016   ALKPHOS 59 02/11/2016   AST 21 02/11/2016   ALT 22 02/11/2016   PROT 6.9 02/11/2016   ALBUMIN 4.6 02/11/2016   CALCIUM 9.4 02/11/2016   ANIONGAP 10 12/07/2015   GFR 103.17 02/11/2016   Lab Results  Component Value Date   CHOL 162 02/11/2016   Lab Results  Component Value Date   HDL 63.40 02/11/2016   Lab Results  Component Value Date   LDLCALC 88 02/11/2016   Lab Results  Component Value Date   TRIG 52.0 02/11/2016   Lab Results  Component Value Date   CHOLHDL 3 02/11/2016   No results found for: HGBA1C     Assessment & Plan:   Problem List Items Addressed This Visit    Erectile dysfunction   Relevant Orders   Ambulatory referral to Urology   Testos,Total,Free and SHBG (Male) (Completed)   Gait disturbance    Secondary to MS, is referred for more PT.      Relevant Orders   Ambulatory referral to Physical Therapy   HTN (hypertension)  Relevant Orders   TSH (Completed)   CBC (Completed)   Comprehensive metabolic panel (Completed)   Hyperlipidemia   Relevant Orders   Lipid panel (Completed)   Hypotestosteronism    Slightly overtreated will drop dosing to 1 ml weekly      Hypothyroidism    On Levothyroxine, continue to monitor      Relevant  Orders   TSH (Completed)   CBC (Completed)   Comprehensive metabolic panel (Completed)   MS (multiple sclerosis) (HCC)   Relevant Orders   Ambulatory referral to Physical Therapy   Preventative health care - Primary    Patient encouraged to maintain heart healthy diet, regular exercise, adequate sleep. Consider daily probiotics. Take medications as prescribed. Labs reviewed.       Sun-damaged skin    Referred too dermatology for further consideration      Urinary urgency    With ED, is referred to urology for further consideration      Relevant Orders   PSA (Completed)   Urinalysis (Completed)   Urine culture (Completed)    Other Visit Diagnoses    Urinary hesitancy        Relevant Orders    PSA (Completed)    Urinalysis (Completed)    Urine culture (Completed)    Urinary incontinence, unspecified incontinence type        Relevant Orders    PSA (Completed)    Urinalysis (Completed)    Urine culture (Completed)    Low testosterone        Relevant Orders    Testos,Total,Free and SHBG (Male) (Completed)    Skin lesion of chest wall        Relevant Orders    Ambulatory referral to Dermatology       I have discontinued Mr. Alberto sildenafil and baclofen. I have also changed his testosterone cypionate. Additionally, I am having him start on clobetasol cream. Lastly, I am having him maintain his dalfampridine, cetirizine, aspirin EC, nitroGLYCERIN, olmesartan, cyclobenzaprine, SYNTHROID, atorvastatin, ibuprofen, BACLOFEN IT, LORazepam, gabapentin, natalizumab, methylphenidate, tamsulosin, and DULoxetine.  Meds ordered this encounter  Medications  . testosterone cypionate (DEPOTESTOSTERONE CYPIONATE) 200 MG/ML injection    Sig: Inject 1.2 mLs (240 mg total) into the muscle every 7 (seven) days. Inject 1.2 mL into the muscle every 7 days. With all necessary supplies    Dispense:  10 mL    Refill:  2  . DISCONTD: DULoxetine (CYMBALTA) 60 MG capsule    Sig: Take 1  capsule (60 mg total) by mouth 2 (two) times daily.    Dispense:  60 capsule    Refill:  2  . clobetasol cream (TEMOVATE) 0.05 %    Sig: Apply 1 application topically 2 (two) times daily.    Dispense:  60 g    Refill:  1  . DULoxetine (CYMBALTA) 60 MG capsule    Sig: Take 1 capsule (60 mg total) by mouth 2 (two) times daily.    Dispense:  180 capsule    Refill:  5     [name], MD

## 2016-02-11 NOTE — Assessment & Plan Note (Signed)
Avoid offending foods, start probiotics. Do not eat large meals in late evening and consider raising head of bed.  

## 2016-02-11 NOTE — Assessment & Plan Note (Signed)
Encouraged DASH diet, decrease po intake and increase exercise as tolerated. Needs 7-8 hours of sleep nightly. Avoid trans fats, eat small, frequent meals every 4-5 hours with lean proteins, complex carbs and healthy fats. Minimize simple carbs, GMO foods. 

## 2016-02-11 NOTE — Patient Instructions (Addendum)
NOW vitamin 10 strain Probiotic  Encouraged increased hydration and fiber in diet. Daily probiotics. If bowels not moving can use MOM 2 tbls po in 4 oz of warm prune juice by mouth every 2-3 days. If no results then repeat in 4 hours with  Dulcolax suppository pr, may repeat again in 4 more hours as needed. Seek care if symptoms worsen. Consider daily Miralax and/or Dulcolax if symptoms persist.   Preventive Care for Adults, Male A healthy lifestyle and preventive care can promote health and wellness. Preventive health guidelines for men include the following key practices:  A routine yearly physical is a good way to check with your health care provider about your health and preventative screening. It is a chance to share any concerns and updates on your health and to receive a thorough exam.  Visit your dentist for a routine exam and preventative care every 6 months. Brush your teeth twice a day and floss once a day. Good oral hygiene prevents tooth decay and gum disease.  The frequency of eye exams is based on your age, health, family medical history, use of contact lenses, and other factors. Follow your health care provider's recommendations for frequency of eye exams.  Eat a healthy diet. Foods such as vegetables, fruits, whole grains, low-fat dairy products, and lean protein foods contain the nutrients you need without too many calories. Decrease your intake of foods high in solid fats, added sugars, and salt. Eat the right amount of calories for you.Get information about a proper diet from your health care provider, if necessary.  Regular physical exercise is one of the most important things you can do for your health. Most adults should get at least 150 minutes of moderate-intensity exercise (any activity that increases your heart rate and causes you to sweat) each week. In addition, most adults need muscle-strengthening exercises on 2 or more days a week.  Maintain a healthy weight. The  body mass index (BMI) is a screening tool to identify possible weight problems. It provides an estimate of body fat based on height and weight. Your health care provider can find your BMI and can help you achieve or maintain a healthy weight.For adults 20 years and older:  A BMI below 18.5 is considered underweight.  A BMI of 18.5 to 24.9 is normal.  A BMI of 25 to 29.9 is considered overweight.  A BMI of 30 and above is considered obese.  Maintain normal blood lipids and cholesterol levels by exercising and minimizing your intake of saturated fat. Eat a balanced diet with plenty of fruit and vegetables. Blood tests for lipids and cholesterol should begin at age 76 and be repeated every 5 years. If your lipid or cholesterol levels are high, you are over 50, or you are at high risk for heart disease, you may need your cholesterol levels checked more frequently.Ongoing high lipid and cholesterol levels should be treated with medicines if diet and exercise are not working.  If you smoke, find out from your health care provider how to quit. If you do not use tobacco, do not start.  Lung cancer screening is recommended for adults aged 58-80 years who are at high risk for developing lung cancer because of a history of smoking. A yearly low-dose CT scan of the lungs is recommended for people who have at least a 30-pack-year history of smoking and are a current smoker or have quit within the past 15 years. A pack year of smoking is smoking an average  of 1 pack of cigarettes a day for 1 year (for example: 1 pack a day for 30 years or 2 packs a day for 15 years). Yearly screening should continue until the smoker has stopped smoking for at least 15 years. Yearly screening should be stopped for people who develop a health problem that would prevent them from having lung cancer treatment.  If you choose to drink alcohol, do not have more than 2 drinks per day. One drink is considered to be 12 ounces (355 mL) of  beer, 5 ounces (148 mL) of wine, or 1.5 ounces (44 mL) of liquor.  Avoid use of street drugs. Do not share needles with anyone. Ask for help if you need support or instructions about stopping the use of drugs.  High blood pressure causes heart disease and increases the risk of stroke. Your blood pressure should be checked at least every 1-2 years. Ongoing high blood pressure should be treated with medicines, if weight loss and exercise are not effective.  If you are 62-23 years old, ask your health care provider if you should take aspirin to prevent heart disease.  Diabetes screening is done by taking a blood sample to check your blood glucose level after you have not eaten for a certain period of time (fasting). If you are not overweight and you do not have risk factors for diabetes, you should be screened once every 3 years starting at age 46. If you are overweight or obese and you are 12-45 years of age, you should be screened for diabetes every year as part of your cardiovascular risk assessment.  Colorectal cancer can be detected and often prevented. Most routine colorectal cancer screening begins at the age of 45 and continues through age 61. However, your health care provider may recommend screening at an earlier age if you have risk factors for colon cancer. On a yearly basis, your health care provider may provide home test kits to check for hidden blood in the stool. Use of a small camera at the end of a tube to directly examine the colon (sigmoidoscopy or colonoscopy) can detect the earliest forms of colorectal cancer. Talk to your health care provider about this at age 9, when routine screening begins. Direct exam of the colon should be repeated every 5-10 years through age 57, unless early forms of precancerous polyps or small growths are found.  People who are at an increased risk for hepatitis B should be screened for this virus. You are considered at high risk for hepatitis B if:  You  were born in a country where hepatitis B occurs often. Talk with your health care provider about which countries are considered high risk.  Your parents were born in a high-risk country and you have not received a shot to protect against hepatitis B (hepatitis B vaccine).  You have HIV or AIDS.  You use needles to inject street drugs.  You live with, or have sex with, someone who has hepatitis B.  You are a man who has sex with other men (MSM).  You get hemodialysis treatment.  You take certain medicines for conditions such as cancer, organ transplantation, and autoimmune conditions.  Hepatitis C blood testing is recommended for all people born from [birth year] through 1965 and any individual with known risks for hepatitis C.  Practice safe sex. Use condoms and avoid high-risk sexual practices to reduce the spread of sexually transmitted infections (STIs). STIs include gonorrhea, chlamydia, syphilis, trichomonas, herpes, HPV, and human immunodeficiency  virus (HIV). Herpes, HIV, and HPV are viral illnesses that have no cure. They can result in disability, cancer, and death.  If you are a man who has sex with other men, you should be screened at least once per year for:  HIV.  Urethral, rectal, and pharyngeal infection of gonorrhea, chlamydia, or both.  If you are at risk of being infected with HIV, it is recommended that you take a prescription medicine daily to prevent HIV infection. This is called preexposure prophylaxis (PrEP). You are considered at risk if:  You are a man who has sex with other men (MSM) and have other risk factors.  You are a heterosexual man, are sexually active, and are at increased risk for HIV infection.  You take drugs by injection.  You are sexually active with a partner who has HIV.  Talk with your health care provider about whether you are at high risk of being infected with HIV. If you choose to begin PrEP, you should first be tested for HIV. You should then  be tested every 3 months for as long as you are taking PrEP.  A one-time screening for abdominal aortic aneurysm (AAA) and surgical repair of large AAAs by ultrasound are recommended for men ages 2 to 61 years who are current or former smokers.  Healthy men should no longer receive prostate-specific antigen (PSA) blood tests as part of routine cancer screening. Talk with your health care provider about prostate cancer screening.  Testicular cancer screening is not recommended for adult males who have no symptoms. Screening includes self-exam, a health care provider exam, and other screening tests. Consult with your health care provider about any symptoms you have or any concerns you have about testicular cancer.  Use sunscreen. Apply sunscreen liberally and repeatedly throughout the day. You should seek shade when your shadow is shorter than you. Protect yourself by wearing long sleeves, pants, a wide-brimmed hat, and sunglasses year round, whenever you are outdoors.  Once a month, do a whole-body skin exam, using a mirror to look at the skin on your back. Tell your health care provider about new moles, moles that have irregular borders, moles that are larger than a pencil eraser, or moles that have changed in shape or color.  Stay current with required vaccines (immunizations).  Influenza vaccine. All adults should be immunized every year.  Tetanus, diphtheria, and acellular pertussis (Td, Tdap) vaccine. An adult who has not previously received Tdap or who does not know his vaccine status should receive 1 dose of Tdap. This initial dose should be followed by tetanus and diphtheria toxoids (Td) booster doses every 10 years. Adults with an unknown or incomplete history of completing a 3-dose immunization series with Td-containing vaccines should begin or complete a primary immunization series including a Tdap dose. Adults should receive a Td booster every 10 years.  Varicella vaccine. An adult  without evidence of immunity to varicella should receive 2 doses or a second dose if he has previously received 1 dose.  Human papillomavirus (HPV) vaccine. Males aged 11-21 years who have not received the vaccine previously should receive the 3-dose series. Males aged 22-26 years may be immunized. Immunization is recommended through the age of 35 years for any male who has sex with males and did not get any or all doses earlier. Immunization is recommended for any person with an immunocompromised condition through the age of 88 years if he did not get any or all doses earlier. During the 3-dose  series, the second dose should be obtained 4-8 weeks after the first dose. The third dose should be obtained 24 weeks after the first dose and 16 weeks after the second dose.  Zoster vaccine. One dose is recommended for adults aged 64 years or older unless certain conditions are present.  Measles, mumps, and rubella (MMR) vaccine. Adults born before 3 generally are considered immune to measles and mumps. Adults born in [birth year] or later should have 1 or more doses of MMR vaccine unless there is a contraindication to the vaccine or there is laboratory evidence of immunity to each of the three diseases. A routine second dose of MMR vaccine should be obtained at least 28 days after the first dose for students attending postsecondary schools, health care workers, or international travelers. People who received inactivated measles vaccine or an unknown type of measles vaccine during 1963-1967 should receive 2 doses of MMR vaccine. People who received inactivated mumps vaccine or an unknown type of mumps vaccine before 1979 and are at high risk for mumps infection should consider immunization with 2 doses of MMR vaccine. Unvaccinated health care workers born before [birth year] who lack laboratory evidence of measles, mumps, or rubella immunity or laboratory confirmation of disease should consider measles and mumps immunization with  2 doses of MMR vaccine or rubella immunization with 1 dose of MMR vaccine.  Pneumococcal 13-valent conjugate (PCV13) vaccine. When indicated, a person who is uncertain of his immunization history and has no record of immunization should receive the PCV13 vaccine. All adults 34 years of age and older should receive this vaccine. An adult aged 23 years or older who has certain medical conditions and has not been previously immunized should receive 1 dose of PCV13 vaccine. This PCV13 should be followed with a dose of pneumococcal polysaccharide (PPSV23) vaccine. Adults who are at high risk for pneumococcal disease should obtain the PPSV23 vaccine at least 8 weeks after the dose of PCV13 vaccine. Adults older than 43 years of age who have normal immune system function should obtain the PPSV23 vaccine dose at least 1 year after the dose of PCV13 vaccine.  Pneumococcal polysaccharide (PPSV23) vaccine. When PCV13 is also indicated, PCV13 should be obtained first. All adults aged 80 years and older should be immunized. An adult younger than age 86 years who has certain medical conditions should be immunized. Any person who resides in a nursing home or long-term care facility should be immunized. An adult smoker should be immunized. People with an immunocompromised condition and certain other conditions should receive both PCV13 and PPSV23 vaccines. People with human immunodeficiency virus (HIV) infection should be immunized as soon as possible after diagnosis. Immunization during chemotherapy or radiation therapy should be avoided. Routine use of PPSV23 vaccine is not recommended for American Indians, [place] Natives, or people younger than 65 years unless there are medical conditions that require PPSV23 vaccine. When indicated, people who have unknown immunization and have no record of immunization should receive PPSV23 vaccine. One-time revaccination 5 years after the first dose of PPSV23 is recommended for people aged  19-64 years who have chronic kidney failure, nephrotic syndrome, asplenia, or immunocompromised conditions. People who received 1-2 doses of PPSV23 before age 37 years should receive another dose of PPSV23 vaccine at age 88 years or later if at least 5 years have passed since the previous dose. Doses of PPSV23 are not needed for people immunized with PPSV23 at or after age 73 years.  Meningococcal vaccine. Adults with asplenia or persistent  complement component deficiencies should receive 2 doses of quadrivalent meningococcal conjugate (MenACWY-D) vaccine. The doses should be obtained at least 2 months apart. Microbiologists working with certain meningococcal bacteria, [employer] recruits, people at risk during an outbreak, and people who travel to or live in countries with a high rate of meningitis should be immunized. A first-year college student up through age 15 years who is living in a residence hall should receive a dose if he did not receive a dose on or after his 16th birthday. Adults who have certain high-risk conditions should receive one or more doses of vaccine.  Hepatitis A vaccine. Adults who wish to be protected from this disease, have chronic liver disease, work with hepatitis A-infected animals, work in hepatitis A research labs, or travel to or work in countries with a high rate of hepatitis A should be immunized. Adults who were previously unvaccinated and who anticipate close contact with an international adoptee during the first 60 days after arrival in the [place] from a country with a high rate of hepatitis A should be immunized.  Hepatitis B vaccine. Adults should be immunized if they wish to be protected from this disease, are under age 62 years and have diabetes, have chronic liver disease, have had more than one sex partner in the past 6 months, may be exposed to blood or other infectious body fluids, are household contacts or sex partners of hepatitis B positive people, are  clients or workers in certain care facilities, or travel to or work in countries with a high rate of hepatitis B.  Haemophilus influenzae type b (Hib) vaccine. A previously unvaccinated person with asplenia or sickle cell disease or having a scheduled splenectomy should receive 1 dose of Hib vaccine. Regardless of previous immunization, a recipient of a hematopoietic stem cell transplant should receive a 3-dose series 6-12 months after his successful transplant. Hib vaccine is not recommended for adults with HIV infection. Preventive Service / Frequency Ages 47 to 45  Blood pressure check.** / Every 3-5 years.  Lipid and cholesterol check.** / Every 5 years beginning at age 34.  Hepatitis C blood test.** / For any individual with known risks for hepatitis C.  Skin self-exam. / Monthly.  Influenza vaccine. / Every year.  Tetanus, diphtheria, and acellular pertussis (Tdap, Td) vaccine.** / Consult your health care provider. 1 dose of Td every 10 years.  Varicella vaccine.** / Consult your health care provider.  HPV vaccine. / 3 doses over 6 months, if 46 or younger.  Measles, mumps, rubella (MMR) vaccine.** / You need at least 1 dose of MMR if you were born in 1957 or later. You may also need a second dose.  Pneumococcal 13-valent conjugate (PCV13) vaccine.** / Consult your health care provider.  Pneumococcal polysaccharide (PPSV23) vaccine.** / 1 to 2 doses if you smoke cigarettes or if you have certain conditions.  Meningococcal vaccine.** / 1 dose if you are age 78 to 17 years and a [employer] researcher living in a residence hall, or have one of several medical conditions. You may also need additional booster doses.  Hepatitis A vaccine.** / Consult your health care provider.  Hepatitis B vaccine.** / Consult your health care provider.  Haemophilus influenzae type b (Hib) vaccine.** / Consult your health care provider. Ages 54 to 43  Blood pressure check.** / Every  year.  Lipid and cholesterol check.** / Every 5 years beginning at age 28.  Lung cancer screening. / Every year if you are aged  55-80 years and have a 30-pack-year history of smoking and currently smoke or have quit within the past 15 years. Yearly screening is stopped once you have quit smoking for at least 15 years or develop a health problem that would prevent you from having lung cancer treatment.  Fecal occult blood test (FOBT) of stool. / Every year beginning at age 34 and continuing until age 61. You may not have to do this test if you get a colonoscopy every 10 years.  Flexible sigmoidoscopy** or colonoscopy.** / Every 5 years for a flexible sigmoidoscopy or every 10 years for a colonoscopy beginning at age 42 and continuing until age 73.  Hepatitis C blood test.** / For all people born from [birth year] through 1965 and any individual with known risks for hepatitis C.  Skin self-exam. / Monthly.  Influenza vaccine. / Every year.  Tetanus, diphtheria, and acellular pertussis (Tdap/Td) vaccine.** / Consult your health care provider. 1 dose of Td every 10 years.  Varicella vaccine.** / Consult your health care provider.  Zoster vaccine.** / 1 dose for adults aged 17 years or older.  Measles, mumps, rubella (MMR) vaccine.** / You need at least 1 dose of MMR if you were born in 1957 or later. You may also need a second dose.  Pneumococcal 13-valent conjugate (PCV13) vaccine.** / Consult your health care provider.  Pneumococcal polysaccharide (PPSV23) vaccine.** / 1 to 2 doses if you smoke cigarettes or if you have certain conditions.  Meningococcal vaccine.** / Consult your health care provider.  Hepatitis A vaccine.** / Consult your health care provider.  Hepatitis B vaccine.** / Consult your health care provider.  Haemophilus influenzae type b (Hib) vaccine.** / Consult your health care provider. Ages 33 and over  Blood pressure check.** / Every year.  Lipid and cholesterol  check.**/ Every 5 years beginning at age 52.  Lung cancer screening. / Every year if you are aged 39-80 years and have a 30-pack-year history of smoking and currently smoke or have quit within the past 15 years. Yearly screening is stopped once you have quit smoking for at least 15 years or develop a health problem that would prevent you from having lung cancer treatment.  Fecal occult blood test (FOBT) of stool. / Every year beginning at age 13 and continuing until age 45. You may not have to do this test if you get a colonoscopy every 10 years.  Flexible sigmoidoscopy** or colonoscopy.** / Every 5 years for a flexible sigmoidoscopy or every 10 years for a colonoscopy beginning at age 79 and continuing until age 64.  Hepatitis C blood test.** / For all people born from [birth year] through 1965 and any individual with known risks for hepatitis C.  Abdominal aortic aneurysm (AAA) screening.** / A one-time screening for ages 26 to 45 years who are current or former smokers.  Skin self-exam. / Monthly.  Influenza vaccine. / Every year.  Tetanus, diphtheria, and acellular pertussis (Tdap/Td) vaccine.** / 1 dose of Td every 10 years.  Varicella vaccine.** / Consult your health care provider.  Zoster vaccine.** / 1 dose for adults aged 106 years or older.  Pneumococcal 13-valent conjugate (PCV13) vaccine.** / 1 dose for all adults aged 97 years and older.  Pneumococcal polysaccharide (PPSV23) vaccine.** / 1 dose for all adults aged 23 years and older.  Meningococcal vaccine.** / Consult your health care provider.  Hepatitis A vaccine.** / Consult your health care provider.  Hepatitis B vaccine.** / Consult your health care provider.  Haemophilus  influenzae type b (Hib) vaccine.** / Consult your health care provider. **Family history and personal history of risk and conditions may change your health care provider's recommendations.   This information is not intended to replace advice given to you  by your health care provider. Make sure you discuss any questions you have with your health care provider.   Document Released: 01/20/2002 Document Revised: 12/15/2014 Document Reviewed: 04/21/2011 Elsevier Interactive Patient Education 2016 [employer].      Constipation, Adult Constipation is when a person has fewer than three bowel movements a week, has difficulty having a bowel movement, or has stools that are dry, hard, or larger than normal. As people grow older, constipation is more common. A low-fiber diet, not taking in enough fluids, and taking certain medicines may make constipation worse.  CAUSES   Certain medicines, such as antidepressants, pain medicine, iron supplements, antacids   and water pills.   Certain diseases, such as diabetes, irritable bowel syndrome (IBS), thyroid disease, or depression.   Not drinking enough water.   Not eating enough fiber-rich foods.   Stress or travel.   Lack of physical activity or exercise.   Ignoring the urge to have a bowel movement.   Using laxatives too much.  SIGNS AND SYMPTOMS   Having fewer than three bowel movements a week.   Straining to have a bowel movement.   Having stools that are hard, dry, or larger than normal.   Feeling full or bloated.   Pain in the lower abdomen.   Not feeling relief after having a bowel movement.  DIAGNOSIS  Your health care provider will take a medical history and perform a physical exam. Further testing may be done for severe constipation. Some tests may include:  A barium enema X-ray to examine your rectum, colon, and, sometimes, your small intestine.   A sigmoidoscopy to examine your lower colon.   A colonoscopy to examine your entire colon. TREATMENT  Treatment will depend on the severity of your constipation and what is causing it. Some dietary treatments include drinking more fluids and eating more fiber-rich foods. Lifestyle treatments may include regular  exercise. If these diet and lifestyle recommendations do not help, your health care provider may recommend taking over-the-counter laxative medicines to help you have bowel movements. Prescription medicines may be prescribed if over-the-counter medicines do not work.  HOME CARE INSTRUCTIONS   Eat foods that have a lot of fiber, such as fruits, vegetables, whole grains, and beans.  Limit foods high in fat and processed sugars, such as french fries, hamburgers, cookies, candies, and soda.   A fiber supplement may be added to your diet if you cannot get enough fiber from foods.   Drink enough fluids to keep your urine clear or pale yellow.   Exercise regularly or as directed by your health care provider.   Go to the restroom when you have the urge to go. Do not hold it.   Only take over-the-counter or prescription medicines as directed by your health care provider. Do not take other medicines for constipation without talking to your health care provider first.  [place] IF:   You have bright red blood in your stool.   Your constipation lasts for more than 4 days or gets worse.   You have abdominal or rectal pain.   You have thin, pencil-like stools.   You have unexplained weight loss. MAKE SURE YOU:   Understand these instructions.  Will watch your condition.  Will get help right away if you are not doing well or get worse.   This information is not intended to replace advice given to you by your health care provider. Make sure you discuss any questions you have with your health care provider.   Document Released: 08/22/2004 Document Revised: 12/15/2014 Document Reviewed: 09/05/2013 Elsevier Interactive Patient Education [employer].

## 2016-02-12 ENCOUNTER — Other Ambulatory Visit: Payer: Self-pay | Admitting: Family Medicine

## 2016-02-12 ENCOUNTER — Encounter: Payer: Self-pay | Admitting: Family Medicine

## 2016-02-12 LAB — CBC
HCT: 45.4 % (ref 39.0–52.0)
Hemoglobin: 14.7 g/dL (ref 13.0–17.0)
MCHC: 32.5 g/dL (ref 30.0–36.0)
MCV: 82.9 fl (ref 78.0–100.0)
Platelets: 244 10*3/uL (ref 150.0–400.0)
RBC: 5.48 Mil/uL (ref 4.22–5.81)
RDW: 16.8 % — ABNORMAL HIGH (ref 11.5–15.5)
WBC: 9.7 10*3/uL (ref 4.0–10.5)

## 2016-02-12 LAB — LIPID PANEL
Cholesterol: 162 mg/dL (ref 0–200)
HDL: 63.4 mg/dL (ref >39.00)
LDL Cholesterol: 88 mg/dL (ref 0–99)
NonHDL: 98.41
Total CHOL/HDL Ratio: 3
Triglycerides: 52 mg/dL (ref 0.0–149.0)
VLDL: 10.4 mg/dL (ref 0.0–40.0)

## 2016-02-12 LAB — COMPREHENSIVE METABOLIC PANEL
ALT: 22 U/L (ref 0–53)
AST: 21 U/L (ref 0–37)
Albumin: 4.6 g/dL (ref 3.5–5.2)
Alkaline Phosphatase: 59 U/L (ref 39–117)
BUN: 14 mg/dL (ref 6–23)
CO2: 29 mEq/L (ref 19–32)
Calcium: 9.4 mg/dL (ref 8.4–10.5)
Chloride: 101 mEq/L (ref 96–112)
Creatinine, Ser: 0.86 mg/dL (ref 0.40–1.50)
GFR: 103.17 mL/min (ref >60.00)
Glucose, Bld: 81 mg/dL (ref 70–99)
Potassium: 4.1 mEq/L (ref 3.5–5.1)
Sodium: 140 mEq/L (ref 135–145)
Total Bilirubin: 0.6 mg/dL (ref 0.2–1.2)
Total Protein: 6.9 g/dL (ref 6.0–8.3)

## 2016-02-12 LAB — URINALYSIS
Bilirubin Urine: NEGATIVE
Hgb urine dipstick: NEGATIVE
Ketones, ur: NEGATIVE
Leukocytes, UA: NEGATIVE
Nitrite: NEGATIVE
Specific Gravity, Urine: <=1.005 — AB (ref 1.000–1.030)
Total Protein, Urine: NEGATIVE
Urine Glucose: NEGATIVE
Urobilinogen, UA: 0.2 (ref 0.0–1.0)
pH: 7.5 (ref 5.0–8.0)

## 2016-02-12 LAB — PSA: PSA: 0.52 ng/mL (ref 0.10–4.00)

## 2016-02-12 LAB — TSH: TSH: 1.04 u[IU]/mL (ref 0.35–4.50)

## 2016-02-12 MED ORDER — FUROSEMIDE 20 MG PO TABS: ORAL_TABLET | ORAL | Status: DC

## 2016-02-13 ENCOUNTER — Encounter: Payer: Self-pay | Admitting: Family Medicine

## 2016-02-13 LAB — URINE CULTURE
Colony Count: NO GROWTH
Organism ID, Bacteria: NO GROWTH

## 2016-02-13 LAB — TESTOS,TOTAL,FREE AND SHBG (FEMALE)
Sex Hormone Binding Glob.: 34 nmol/L (ref 10–50)
Testosterone, Free: 231.1 pg/mL — ABNORMAL HIGH (ref 35.0–155.0)
Testosterone,Total,LC/MS/MS: 1236 ng/dL — ABNORMAL HIGH (ref 250–1100)

## 2016-02-15 ENCOUNTER — Other Ambulatory Visit: Payer: Self-pay | Admitting: Family Medicine

## 2016-02-17 ENCOUNTER — Encounter: Payer: Self-pay | Admitting: Family Medicine

## 2016-02-17 DIAGNOSIS — N529 Male erectile dysfunction, unspecified: Secondary | ICD-10-CM | POA: Insufficient documentation

## 2016-02-17 DIAGNOSIS — L578 Other skin changes due to chronic exposure to nonionizing radiation: Secondary | ICD-10-CM

## 2016-02-17 HISTORY — DX: Other skin changes due to chronic exposure to nonionizing radiation: L57.8

## 2016-02-17 NOTE — Assessment & Plan Note (Signed)
Referred too dermatology for further consideration

## 2016-02-17 NOTE — Assessment & Plan Note (Signed)
Secondary to MS, is referred for more PT.

## 2016-02-17 NOTE — Assessment & Plan Note (Signed)
Patient encouraged to maintain heart healthy diet, regular exercise, adequate sleep. Consider daily probiotics. Take medications as prescribed. Labs reviewed 

## 2016-02-17 NOTE — Assessment & Plan Note (Signed)
On Levothyroxine, continue to monitor 

## 2016-02-17 NOTE — Assessment & Plan Note (Signed)
Slightly overtreated will drop dosing to 1 ml weekly

## 2016-02-17 NOTE — Assessment & Plan Note (Signed)
With ED, is referred to urology for further consideration

## 2016-02-21 ENCOUNTER — Encounter: Payer: Self-pay | Admitting: Neurology

## 2016-02-22 ENCOUNTER — Other Ambulatory Visit: Payer: Self-pay | Admitting: *Deleted

## 2016-02-22 MED ORDER — METHYLPHENIDATE HCL 10 MG PO TABS: ORAL_TABLET | ORAL | Status: DC

## 2016-02-22 NOTE — Telephone Encounter (Signed)
Pt. requested r/f of Ritalin during inf. appt.  RAS is ooo today, so rx. will be placed up front [facility] on Monday for pt. to pick up/fim

## 2016-02-25 ENCOUNTER — Telehealth: Payer: Self-pay | Admitting: *Deleted

## 2016-02-25 NOTE — Telephone Encounter (Signed)
Received PT Plan of Care; forwarded to provider/SLS 03/20

## 2016-02-29 ENCOUNTER — Ambulatory Visit (INDEPENDENT_AMBULATORY_CARE_PROVIDER_SITE_OTHER): Payer: Managed Care, Other (non HMO) | Admitting: Psychology

## 2016-02-29 DIAGNOSIS — F4323 Adjustment disorder with mixed anxiety and depressed mood: Secondary | ICD-10-CM

## 2016-03-02 ENCOUNTER — Encounter: Payer: Self-pay | Admitting: Family Medicine

## 2016-03-03 ENCOUNTER — Other Ambulatory Visit: Payer: Self-pay | Admitting: Family Medicine

## 2016-03-03 MED ORDER — DULOXETINE HCL 60 MG PO CPEP: 60.0000 mg | ORAL_CAPSULE | Freq: Two times a day (BID) | ORAL | Status: DC

## 2016-03-12 ENCOUNTER — Ambulatory Visit (INDEPENDENT_AMBULATORY_CARE_PROVIDER_SITE_OTHER): Payer: Managed Care, Other (non HMO) | Admitting: Psychology

## 2016-03-12 DIAGNOSIS — F4323 Adjustment disorder with mixed anxiety and depressed mood: Secondary | ICD-10-CM

## 2016-03-21 ENCOUNTER — Other Ambulatory Visit: Payer: Self-pay | Admitting: Neurology

## 2016-03-21 MED ORDER — METHYLPHENIDATE HCL 10 MG PO TABS: ORAL_TABLET | ORAL | Status: DC

## 2016-03-21 MED ORDER — CYCLOBENZAPRINE HCL 10 MG PO TABS: 10.0000 mg | ORAL_TABLET | Freq: Three times a day (TID) | ORAL | Status: DC | PRN

## 2016-03-26 ENCOUNTER — Ambulatory Visit (INDEPENDENT_AMBULATORY_CARE_PROVIDER_SITE_OTHER): Payer: Managed Care, Other (non HMO) | Admitting: Psychology

## 2016-03-26 DIAGNOSIS — F4323 Adjustment disorder with mixed anxiety and depressed mood: Secondary | ICD-10-CM | POA: Diagnosis not present

## 2016-03-28 ENCOUNTER — Encounter: Payer: Self-pay | Admitting: Neurology

## 2016-03-28 ENCOUNTER — Ambulatory Visit (INDEPENDENT_AMBULATORY_CARE_PROVIDER_SITE_OTHER): Payer: Managed Care, Other (non HMO) | Admitting: Neurology

## 2016-03-28 VITALS — BP 114/70 | HR 82 | Resp 16 | Ht 75.0 in | Wt 281.0 lb

## 2016-03-28 DIAGNOSIS — F418 Other specified anxiety disorders: Secondary | ICD-10-CM

## 2016-03-28 DIAGNOSIS — R252 Cramp and spasm: Secondary | ICD-10-CM

## 2016-03-28 DIAGNOSIS — R3915 Urgency of urination: Secondary | ICD-10-CM

## 2016-03-28 DIAGNOSIS — R258 Other abnormal involuntary movements: Secondary | ICD-10-CM

## 2016-03-28 DIAGNOSIS — R269 Unspecified abnormalities of gait and mobility: Secondary | ICD-10-CM

## 2016-03-28 DIAGNOSIS — G35 Multiple sclerosis: Secondary | ICD-10-CM | POA: Diagnosis not present

## 2016-03-28 MED ORDER — CLONAZEPAM 1 MG PO TABS: 1.0000 mg | ORAL_TABLET | Freq: Four times a day (QID) | ORAL | Status: DC

## 2016-03-28 NOTE — Progress Notes (Signed)
HPI    Multiple Sclerosis   Additional comments: Sts. he continues to tolerate Tysabri well.  Not sure if JCV ab was checked in the last 6 mos--has not been checked here at [facility].  Sts. chest pain/pressure--that follows the region his seatbelt and satchel contact--is more frequent.  Not sure if this is neuro related or musculskeletal due to the way he walks/fim     Last edited by Hayden Pedro, RN on 03/28/2016  9:13 AM. (History)      HISTORY:  MS:     He is currently on Tysabri. He tolerates it well. We discussed ocrelizumab.  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   Gait/strength/Spasticity:   He notes improvement in leg strength after reducing the baclofen and adding clonidine in February. He is able to lift his legs up better. He is able to walk a little further without stopping. The addition of clonidine has helped the nighttime spasms though they still occur to a milder extent.    Sometimes the spasms will wake him up in the middle of the night or make it difficult for him to fall back asleep.  Pain:  He has intermittent chest pain often in distribution of where the seatbelt [place] on the right side of the chest. There is not tenderness to palpation and he does not note increased pain when he coughs or sneezes. However, pain usually begins when he is using the right arm more. He notes that he needs to rely on right arm strength quite a bit when he is standing up to use the walker.   Pain also will develop when he is driving (uses a right arm controlled accelerator) and sometimes at work.     When he takes a lorazepam, the pain will usually improve after 15-20 minutes.      He takes gabapentin 300-300-600 and lorazepam.    Lorazepam makes him a little sleepy.     He notes no change in bladder issues he feels some decreased mood at times he  denied anxiety. Cognition has been fine.Marland Kitchen   PHYSICAL EXAM  General: The patient is well-developed and well-nourished and in no acute distress  Neurologic Exam  Mental status: The patient is alert and oriented x 3 at the time of the examination.  Speech is normal.  Motor: Muscle bulk is normal. Tone is increased in right arm and leg. Strength is 4+ / 5 in the right arm, 5/5 in the left arm, 2+-3/5 in the right leg 3 to 4-/5 on the left.   Gait and station: He needs unilateral support to stand and walk.    Reflexes: Deep tendon reflexes are 1 and symmetric bilaterally.   ASSESSMENT  MS (multiple sclerosis) (HCC)  Gait disturbance  Spasticity  Depression with anxiety  Urinary urgency    PLAN: 1.   Continue with current settings.     Next pump fill with 3600 mg baclofen and 540 mg clonidine 2.   Uncertain if pain is muscular skeletal or more related to the MS. Change lorazepam to clonazepam, continue gabapentin but make sure to get in all 4 pills. 3.   Continue Tysabri for now. We will check the JCV antibody. We discussed that if she converts to JCV antibody positive we will consider a different medication such as ocrelizumab. For the time  being he would prefer to stay on Tysabri  4.  Return as scheduled or sooner if there are new or worsening neurologic issues.  Depending on results of clonidine addition, we can make further adjustments if needed.  35 minute face-to-face interaction with greater than one half of the time counseling and coordinating care about his MS and related issues.  Richard A. Epimenio Foot, MD, PhD Certified in Neurology, Clinical Neurophysiology, Sleep Medicine, Pain Medicine and Neuroimaging  [facility] [address] [place], [place] [address] [phone]

## 2016-03-29 LAB — CBC WITH DIFFERENTIAL/PLATELET
Basophils Absolute: 0.1 10*3/uL (ref 0.0–0.2)
Basos: 1 %
EOS (ABSOLUTE): 0.5 10*3/uL — ABNORMAL HIGH (ref 0.0–0.4)
Eos: 6 %
Hematocrit: 44.6 % (ref 37.5–51.0)
Hemoglobin: 14.7 g/dL (ref 12.6–17.7)
Immature Grans (Abs): 0.1 10*3/uL (ref 0.0–0.1)
Immature Granulocytes: 1 %
Lymphocytes Absolute: 2.4 10*3/uL (ref 0.7–3.1)
Lymphs: 29 %
MCH: 27.6 pg (ref 26.6–33.0)
MCHC: 33 g/dL (ref 31.5–35.7)
MCV: 84 fL (ref 79–97)
Monocytes Absolute: 0.7 10*3/uL (ref 0.1–0.9)
Monocytes: 9 %
Neutrophils Absolute: 4.5 10*3/uL (ref 1.4–7.0)
Neutrophils: 54 %
Platelets: 186 10*3/uL (ref 150–379)
RBC: 5.32 x10E6/uL (ref 4.14–5.80)
RDW: 18.5 % — ABNORMAL HIGH (ref 12.3–15.4)
WBC: 8.1 10*3/uL (ref 3.4–10.8)

## 2016-04-02 ENCOUNTER — Ambulatory Visit: Payer: Managed Care, Other (non HMO) | Admitting: Psychology

## 2016-04-02 LAB — STRATIFY JCV AB (W/ INDEX) W/ RFLX
Index Value: 0.18
JCV Antibody: NEGATIVE

## 2016-04-07 ENCOUNTER — Telehealth: Payer: Self-pay | Admitting: *Deleted

## 2016-04-07 NOTE — Telephone Encounter (Signed)
I have spoken with Alian this afternoon, and per RAS, advised that JCV ab is still neg.  He verbalized understanding of same/fim

## 2016-04-07 NOTE — Telephone Encounter (Signed)
-----   Message from Asa Lente, MD sent at 04/05/2016  3:35 PM EDT ----- Please let him know that the JCV antibody is still negative.

## 2016-04-09 ENCOUNTER — Encounter: Payer: Self-pay | Admitting: Family Medicine

## 2016-04-10 ENCOUNTER — Other Ambulatory Visit: Payer: Self-pay | Admitting: Family Medicine

## 2016-04-10 MED ORDER — OLMESARTAN MEDOXOMIL 20 MG PO TABS: 20.0000 mg | ORAL_TABLET | Freq: Every day | ORAL | Status: DC

## 2016-04-11 ENCOUNTER — Ambulatory Visit (INDEPENDENT_AMBULATORY_CARE_PROVIDER_SITE_OTHER): Payer: Managed Care, Other (non HMO) | Admitting: Psychology

## 2016-04-11 DIAGNOSIS — F4323 Adjustment disorder with mixed anxiety and depressed mood: Secondary | ICD-10-CM

## 2016-04-25 ENCOUNTER — Ambulatory Visit: Payer: Managed Care, Other (non HMO) | Admitting: Neurology

## 2016-05-09 ENCOUNTER — Ambulatory Visit (INDEPENDENT_AMBULATORY_CARE_PROVIDER_SITE_OTHER): Payer: Managed Care, Other (non HMO) | Admitting: Psychology

## 2016-05-09 DIAGNOSIS — F4323 Adjustment disorder with mixed anxiety and depressed mood: Secondary | ICD-10-CM

## 2016-05-14 ENCOUNTER — Other Ambulatory Visit: Payer: Self-pay | Admitting: Neurology

## 2016-05-15 ENCOUNTER — Telehealth: Payer: Self-pay | Admitting: *Deleted

## 2016-05-15 MED ORDER — METHYLPHENIDATE HCL 10 MG PO TABS: ORAL_TABLET | ORAL | Status: DC

## 2016-05-15 NOTE — Telephone Encounter (Signed)
Ritalin rx. provided during infusion appt./fim 

## 2016-05-30 ENCOUNTER — Telehealth: Payer: Self-pay | Admitting: *Deleted

## 2016-05-30 ENCOUNTER — Ambulatory Visit (INDEPENDENT_AMBULATORY_CARE_PROVIDER_SITE_OTHER): Payer: Managed Care, Other (non HMO) | Admitting: Psychology

## 2016-05-30 DIAGNOSIS — F4323 Adjustment disorder with mixed anxiety and depressed mood: Secondary | ICD-10-CM

## 2016-05-30 NOTE — Telephone Encounter (Signed)
LMTC.  I need to talk to hm about his appt. for pump r/f/fim

## 2016-05-31 ENCOUNTER — Other Ambulatory Visit: Payer: Self-pay | Admitting: Family Medicine

## 2016-06-02 ENCOUNTER — Ambulatory Visit (INDEPENDENT_AMBULATORY_CARE_PROVIDER_SITE_OTHER): Payer: Managed Care, Other (non HMO) | Admitting: Neurology

## 2016-06-02 ENCOUNTER — Encounter: Payer: Self-pay | Admitting: Neurology

## 2016-06-02 VITALS — BP 144/80 | HR 86 | Resp 16

## 2016-06-02 DIAGNOSIS — R269 Unspecified abnormalities of gait and mobility: Secondary | ICD-10-CM | POA: Diagnosis not present

## 2016-06-02 DIAGNOSIS — R208 Other disturbances of skin sensation: Secondary | ICD-10-CM | POA: Diagnosis not present

## 2016-06-02 DIAGNOSIS — G35 Multiple sclerosis: Secondary | ICD-10-CM | POA: Diagnosis not present

## 2016-06-02 DIAGNOSIS — R252 Cramp and spasm: Secondary | ICD-10-CM

## 2016-06-02 DIAGNOSIS — R258 Other abnormal involuntary movements: Secondary | ICD-10-CM

## 2016-06-02 MED ORDER — METHYLPHENIDATE HCL 10 MG PO TABS: ORAL_TABLET | ORAL | Status: DC

## 2016-06-02 NOTE — Addendum Note (Signed)
Addended by: Candis Schatz I on: 06/02/2016 04:35 PM   Modules accepted: Medications

## 2016-06-02 NOTE — Progress Notes (Signed)
   Chief Complaint    Multiple Sclerosis; Pump Refill      HISTORY:  MS:     He is currently on Tysabri. He tolerates it well. We discussed ocrelizumab as an option if he ever converts to JCV Ab positive.   Gait/strength/Spasticity:   He notes improvement in leg strength after reducing the baclofen and adding clonidine in February. He is doing PT.  He is able to lift his legs up better. He is able to walk a little further without stopping. The addition of clonidine has helped the nighttime spasms though they still occur to a much milder extent.   Sometimes the spasms will wake him up in the middle of the night or make it difficult for him to fall back asleep.  Pain:  He notes less trunk pain.   Pain also will develop when he is driving (uses a right arm controlled accelerator) and sometimes at work.     When he takes a lorazepam, the pain will usually improve after 15-20 minutes.   Clonazepam helped about the same but made him more sleepy   He takes gabapentin 300-300-600 and lorazepam.    Lorazepam makes him a little sleepy.     He notes no change in bladder issues he feels some decreased mood at times he denied anxiety. Cognition has been fine.Marland Kitchen   PHYSICAL EXAM  General: The patient is well-developed and well-nourished and in no acute distress.  Extremities:   No rash or edema.    Neurologic Exam  Mental status: The patient is alert and oriented x 3 at the time of the examination. Intact attention and recall.   Speech is normal.  CN:   Extraocular muscles are normal.  No ptosis.    Symmetric facial strength and sensation.    PE/TP midline  Motor: Muscle bulk is normal. Tone is increased in right arm and leg. Strength is 4+ / 5 in the right arm, 5/5 in the left arm, 2+-3/5 in the right leg 3 to 4-/5 on the left.   Gait and station: He needs unilateral support to stand and walk.    Reflexes: Deep tendon reflexes are 1 and symmetric bilaterally.   ASSESSMENT  MS (multiple  sclerosis) (HCC)  Gait disturbance  Spasticity  Dysesthesia     PLAN: 1.   Refill pump with 3600 mg baclofen and 720 mg clonidine (clonidine increased from 360 mg) 2.  Continue lorazepam or clonazepam ande gabapentin but make sure to get in all 4 pills. 3.   Continue Tysabri for now.  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  4.  Return as scheduled or sooner if there are new or worsening neurologic issues.  We can make further adjustments if needed.  35 minute face-to-face interaction with greater than one half of the time counseling and coordinating care about his MS and related issues.  Korry Dalgleish A. Epimenio Foot, MD, PhD Certified in Neurology, Clinical Neurophysiology, Sleep Medicine, Pain Medicine and Neuroimaging  [facility] [address] [place], [place] [address] [phone]

## 2016-06-03 ENCOUNTER — Other Ambulatory Visit: Payer: Self-pay | Admitting: Family Medicine

## 2016-06-06 ENCOUNTER — Encounter: Payer: Self-pay | Admitting: Neurology

## 2016-06-13 ENCOUNTER — Encounter: Payer: Self-pay | Admitting: Family Medicine

## 2016-06-13 ENCOUNTER — Ambulatory Visit (INDEPENDENT_AMBULATORY_CARE_PROVIDER_SITE_OTHER): Payer: Managed Care, Other (non HMO) | Admitting: Family Medicine

## 2016-06-13 VITALS — BP 108/72 | HR 61 | Temp 98.2°F | Ht 75.0 in | Wt 278.1 lb

## 2016-06-13 DIAGNOSIS — G35D Multiple sclerosis, unspecified: Secondary | ICD-10-CM

## 2016-06-13 DIAGNOSIS — G35 Multiple sclerosis: Secondary | ICD-10-CM

## 2016-06-13 DIAGNOSIS — E291 Testicular hypofunction: Secondary | ICD-10-CM

## 2016-06-13 DIAGNOSIS — I2510 Atherosclerotic heart disease of native coronary artery without angina pectoris: Secondary | ICD-10-CM

## 2016-06-13 DIAGNOSIS — E669 Obesity, unspecified: Secondary | ICD-10-CM

## 2016-06-13 DIAGNOSIS — E785 Hyperlipidemia, unspecified: Secondary | ICD-10-CM

## 2016-06-13 DIAGNOSIS — I10 Essential (primary) hypertension: Secondary | ICD-10-CM | POA: Diagnosis not present

## 2016-06-13 DIAGNOSIS — E039 Hypothyroidism, unspecified: Secondary | ICD-10-CM

## 2016-06-13 DIAGNOSIS — E349 Endocrine disorder, unspecified: Secondary | ICD-10-CM

## 2016-06-13 DIAGNOSIS — IMO0001 Reserved for inherently not codable concepts without codable children: Secondary | ICD-10-CM

## 2016-06-13 DIAGNOSIS — K219 Gastro-esophageal reflux disease without esophagitis: Secondary | ICD-10-CM

## 2016-06-13 LAB — CBC
HCT: 43.3 % (ref 39.0–52.0)
Hemoglobin: 14.3 g/dL (ref 13.0–17.0)
MCHC: 33 g/dL (ref 30.0–36.0)
MCV: 87 fl (ref 78.0–100.0)
Platelets: 184 10*3/uL (ref 150.0–400.0)
RBC: 4.97 Mil/uL (ref 4.22–5.81)
RDW: 16 % — ABNORMAL HIGH (ref 11.5–15.5)
WBC: 7 10*3/uL (ref 4.0–10.5)

## 2016-06-13 LAB — LIPID PANEL
Cholesterol: 149 mg/dL (ref 0–200)
HDL: 63.5 mg/dL (ref >39.00)
LDL Cholesterol: 80 mg/dL (ref 0–99)
NonHDL: 85.93
Total CHOL/HDL Ratio: 2
Triglycerides: 32 mg/dL (ref 0.0–149.0)
VLDL: 6.4 mg/dL (ref 0.0–40.0)

## 2016-06-13 LAB — COMPREHENSIVE METABOLIC PANEL
ALT: 25 U/L (ref 0–53)
AST: 18 U/L (ref 0–37)
Albumin: 4.2 g/dL (ref 3.5–5.2)
Alkaline Phosphatase: 52 U/L (ref 39–117)
BUN: 12 mg/dL (ref 6–23)
CO2: 36 mEq/L — ABNORMAL HIGH (ref 19–32)
Calcium: 9.3 mg/dL (ref 8.4–10.5)
Chloride: 104 mEq/L (ref 96–112)
Creatinine, Ser: 0.95 mg/dL (ref 0.40–1.50)
GFR: 91.83 mL/min (ref >60.00)
Glucose, Bld: 85 mg/dL (ref 70–99)
Potassium: 4.3 mEq/L (ref 3.5–5.1)
Sodium: 140 mEq/L (ref 135–145)
Total Bilirubin: 0.7 mg/dL (ref 0.2–1.2)
Total Protein: 6.5 g/dL (ref 6.0–8.3)

## 2016-06-13 LAB — TESTOSTERONE: Testosterone: 254.89 ng/dL — ABNORMAL LOW (ref 300.00–890.00)

## 2016-06-13 LAB — TSH: TSH: 1.51 u[IU]/mL (ref 0.35–4.50)

## 2016-06-13 MED ORDER — NITROGLYCERIN 0.4 MG SL SUBL: 0.4000 mg | SUBLINGUAL_TABLET | SUBLINGUAL | Status: DC | PRN

## 2016-06-13 NOTE — Patient Instructions (Addendum)
Abilify for mood stabilizer comes in low doses can consider [employer] wheelchairs [facility]    Hypertension Hypertension, commonly called high blood pressure, is when the force of blood pumping through your arteries is too strong. Your arteries are the blood vessels that carry blood from your heart throughout your body. A blood pressure reading consists of a higher number over a lower number, such as 110/72. The higher number (systolic) is the pressure inside your arteries when your heart pumps. The lower number (diastolic) is the pressure inside your arteries when your heart relaxes. Ideally you want your blood pressure below 120/80. Hypertension forces your heart to work harder to pump blood. Your arteries may become narrow or stiff. Having untreated or uncontrolled hypertension can cause heart attack, stroke, kidney disease, and other problems. RISK FACTORS Some risk factors for high blood pressure are controllable. Others are not.  Risk factors you cannot control include:   Race. You may be at higher risk if you are African American.  Age. Risk increases with age.  Gender. Men are at higher risk than women before age 69 years. After age 69, women are at higher risk than men. Risk factors you can control include:  Not getting enough exercise or physical activity.  Being overweight.  Getting too much fat, sugar, calories, or salt in your diet.  Drinking too much alcohol. SIGNS AND SYMPTOMS Hypertension does not usually cause signs or symptoms. Extremely high blood pressure (hypertensive crisis) may cause headache, anxiety, shortness of breath, and nosebleed. DIAGNOSIS To check if you have hypertension, your health care provider will measure your blood pressure while you are seated, with your arm held at the level of your heart. It should be measured at least twice using the same arm. Certain conditions can cause a difference in blood pressure between your right and left  arms. A blood pressure reading that is higher than normal on one occasion does not mean that you need treatment. If it is not clear whether you have high blood pressure, you may be asked to return on a different day to have your blood pressure checked again. Or, you may be asked to monitor your blood pressure at home for 1 or more weeks. TREATMENT Treating high blood pressure includes making lifestyle changes and possibly taking medicine. Living a healthy lifestyle can help lower high blood pressure. You may need to change some of your habits. Lifestyle changes may include:  Following the DASH diet. This diet is high in fruits, vegetables, and whole grains. It is low in salt, red meat, and added sugars.  Keep your sodium intake below 2,300 mg per day.  Getting at least 30-45 minutes of aerobic exercise at least 4 times per week.  Losing weight if necessary.  Not smoking.  Limiting alcoholic beverages.  Learning ways to reduce stress. Your health care provider may prescribe medicine if lifestyle changes are not enough to get your blood pressure under control, and if one of the following is true:  You are 79-45 years of age and your systolic blood pressure is above 140.  You are 42 years of age or older, and your systolic blood pressure is above 150.  Your diastolic blood pressure is above 90.  You have diabetes, and your systolic blood pressure is over 140 or your diastolic blood pressure is over 90.  You have kidney disease and your blood pressure is above 140/90.  You have heart disease and your blood pressure is above 140/90. Your personal  target blood pressure may vary depending on your medical conditions, your age, and other factors. HOME CARE INSTRUCTIONS  Have your blood pressure rechecked as directed by your health care provider.   Take medicines only as directed by your health care provider. Follow the directions carefully. Blood pressure medicines must be taken as  prescribed. The medicine does not work as well when you skip doses. Skipping doses also puts you at risk for problems.  Do not smoke.   Monitor your blood pressure at home as directed by your health care provider. SEEK MEDICAL CARE IF:   You think you are having a reaction to medicines taken.  You have recurrent headaches or feel dizzy.  You have swelling in your ankles.  You have trouble with your vision. SEEK IMMEDIATE MEDICAL CARE IF:  You develop a severe headache or confusion.  You have unusual weakness, numbness, or feel faint.  You have severe chest or abdominal pain.  You vomit repeatedly.  You have trouble breathing. MAKE SURE YOU:   Understand these instructions.  Will watch your condition.  Will get help right away if you are not doing well or get worse.   This information is not intended to replace advice given to you by your health care provider. Make sure you discuss any questions you have with your health care provider.   Document Released: 11/24/2005 Document Revised: 04/10/2015 Document Reviewed: 09/16/2013 Elsevier Interactive Patient Education [employer]

## 2016-06-13 NOTE — Assessment & Plan Note (Signed)
Avoid offending foods, start probiotics. Do not eat large meals in late evening and consider raising head of bed.  

## 2016-06-13 NOTE — Assessment & Plan Note (Signed)
On Levothyroxine, continue to monitor 

## 2016-06-13 NOTE — Progress Notes (Signed)
Pre visit review using our clinic review tool, if applicable. No additional management support is needed unless otherwise documented below in the visit note. 

## 2016-06-13 NOTE — Assessment & Plan Note (Signed)
Well controlled, no changes to meds. Encouraged heart healthy diet such as the DASH diet and exercise as tolerated.  °

## 2016-06-17 ENCOUNTER — Encounter: Payer: Self-pay | Admitting: Family Medicine

## 2016-06-19 ENCOUNTER — Telehealth: Payer: Self-pay | Admitting: Neurology

## 2016-06-19 NOTE — Telephone Encounter (Signed)
Patient called to advise Dalfampridine (AMPYRA) 10 MG TB12 Rx has lapsed, needs new prescription sent to [facility].

## 2016-06-20 ENCOUNTER — Ambulatory Visit (INDEPENDENT_AMBULATORY_CARE_PROVIDER_SITE_OTHER): Payer: Managed Care, Other (non HMO) | Admitting: Psychology

## 2016-06-20 DIAGNOSIS — F4323 Adjustment disorder with mixed anxiety and depressed mood: Secondary | ICD-10-CM | POA: Diagnosis not present

## 2016-06-20 MED ORDER — DALFAMPRIDINE ER 10 MG PO TB12: 10.0000 mg | ORAL_TABLET | Freq: Two times a day (BID) | ORAL | Status: DC

## 2016-06-20 NOTE — Telephone Encounter (Signed)
Ampyra escribed to [facility] as requested/fim

## 2016-06-22 NOTE — Assessment & Plan Note (Signed)
Encouraged heart healthy diet, increase exercise, avoid trans fats, consider a krill oil cap daily 

## 2016-06-22 NOTE — Assessment & Plan Note (Addendum)
Continues to follow with neurology and also continues to work. He will consider a new electric wheelchair. Consider [employer].

## 2016-06-22 NOTE — Progress Notes (Signed)
Patient ID: Keith Morrow, male   DOB: [birth year], 45 y.o.   MRN: [id number]   Subjective:    Patient ID: Keith Morrow, male    DOB: September 14, 1971, 45 y.o.   MRN: [id number]  Chief Complaint  Patient presents with  . Follow-up    4 month    HPI Patient is in today for follow up. He continues to follow up with neurology, Dr Leilani Merl and they have recently added Clonidine to his  pump with good results. He is noting some improvement. He is now receiving his physical therapy at [facility] and that is going well. He still struggles to lift legs left is stronger than right. Denies CP/palp/SOB/HA/congestion/fevers/GI or GU c/o. Taking meds as prescribed  Past Medical History  Diagnosis Date  . HTN (hypertension)   . Hypothyroidism   . Hyperlipidemia   . MS (multiple sclerosis) (HCC)   . Coronary atherosclerosis of native coronary artery     a. NSTEMI 04/20/11: tx with Promus DES to LAD; bifurcation lesion with oDx 90% tx with POBA; b. staged PCI of right post. AV Branch with Promus DES; c. residual at cath 04/21/11:  AV groove CFX 50%, mRCA 30%,; EF 55%  . Chicken pox as a child  . Heart attack (HCC) 04-20-11  . Allergy   . Obesity   . Depression   . Onychomycosis   . Tinea pedis   . Keloid scar   . Preventative health care 10/07/2011  . Reflux 10/07/2011  . 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 Hypotestosteronism 11/17/2011  . Dermatitis, contact 12/10/2011  . Bronchitis, acute 12/10/2011  . Testosterone deficiency 01/16/2012  . Fatigue 10/15/2012  . Acute bronchitis 05/09/2013  . Neuromuscular disorder (HCC)     NUMBNESS/TINGLING  . Depression with anxiety   . Sun-damaged skin 02/17/2016    Past Surgical History  Procedure Laterality Date  . Right wrist surgery      CTR  . Vasectomy    . Vascular surgery      vericose vein in right femoral area  . Wisdom tooth extraction    . Coronary angioplasty with stent placement      2012  . Pain pump implantation N/A 08/29/2014   Procedure: Baclofen pump placement;  Surgeon: Maeola Harman, MD;  Location: [facility];  Service: Neurosurgery;  Laterality: N/A;  Baclofen pump placement  . Programable baclofen pump revision  08/29/14    Family History  Problem Relation Age of Onset  . Coronary artery disease      questionable in father  . Heart attack Mother 68  . Thyroid disease Mother   . Heart disease Mother   . Thyroid disease Sister   . Thyroid disease Brother   . Heart disease Maternal Grandmother   . Hypertension Maternal Grandmother   . Hyperlipidemia Maternal Grandmother   . Heart disease Maternal Grandfather   . Hypertension Maternal Grandfather   . Hyperlipidemia Maternal Grandfather   . Stroke Paternal Grandfather   . Thyroid disease Sister   . Heart disease Father     2 stents    Social History   Social History  . Marital Status: Married    Spouse Name: N/A  . Number of Children: 3  . Years of Education: N/A   Occupational History  . Not on file.   Social History Main Topics  . Smoking status: Never Smoker   . Smokeless tobacco: Never Used  . Alcohol Use: Yes  Comment: socially  . Drug Use: No  . 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 Not on file   Social History Narrative    Outpatient Prescriptions Prior to Visit  Medication Sig Dispense Refill  . aspirin EC 81 MG tablet Take 81 mg by mouth daily.    Marland Kitchen atorvastatin (LIPITOR) 40 MG tablet Take 1 tablet (40 mg total) by mouth daily. 90 tablet 3  . BACLOFEN IT by Intrathecal route.    . cetirizine (ZYRTEC) 10 MG tablet Take 10 mg by mouth daily as needed for allergies.     . clobetasol cream (TEMOVATE) 0.05 % Apply 1 application topically 2 (two) times daily. 60 g 1  . clonazePAM (KLONOPIN) 1 MG tablet Take 1 tablet (1 mg total) by mouth 4 (four) times daily. 120 tablet 3  . Clonidine HCl POWD by Does not  apply route.    . cyclobenzaprine (FLEXERIL) 10 MG tablet Take 1 tablet (10 mg total) by mouth 3 (three) times daily as needed for muscle spasms. 90 tablet 11  . DULoxetine (CYMBALTA) 60 MG capsule Take 1 capsule (60 mg total) by mouth 2 (two) times daily. 180 capsule 5  . furosemide (LASIX) 20 MG tablet TAKE 1 TABLET (20 MG TOTAL) BY MOUTH DAILY. 30 tablet 3  . gabapentin (NEURONTIN) 300 MG capsule One po in am, one po in evening and two po qHS 120 capsule 11  . ibuprofen (ADVIL,MOTRIN) 200 MG tablet Take 200 mg by mouth every 6 (six) hours as needed for moderate pain.    Marland Kitchen LORazepam (ATIVAN) 1 MG tablet 4 times a day as needed 120 tablet 3  . methylphenidate (RITALIN) 10 MG tablet Take one pill po up to four times a day 90 tablet 0  . natalizumab (TYSABRI) 300 MG/15ML injection Inject 300 mg into the vein every 30 (thirty) days.    Marland Kitchen olmesartan (BENICAR) 20 MG tablet Take 1 tablet (20 mg total) by mouth daily. 90 tablet 2  . SYNTHROID 150 MCG tablet TAKE ONE TABLET BY MOUTH DAILY BEFORE BREAKFAST 30 tablet 6  . tamsulosin (FLOMAX) 0.4 MG CAPS capsule TAKE ONE CAPSULE (0.4 MG TOTAL) BY MOUTH DAILY. 90 capsule 3  . Dalfampridine (AMPYRA) 10 MG TB12 Take 10 mg by mouth 2 (two) times daily. Every 12 hours    . nitroGLYCERIN (NITROSTAT) 0.4 MG SL tablet Place 0.4 mg under the tongue every 5 (five) minutes as needed for chest pain (x 3 doses).     Marland Kitchen testosterone cypionate (DEPOTESTOSTERONE CYPIONATE) 200 MG/ML injection Inject 1.2 mLs (240 mg total) into the muscle every 7 (seven) days. Inject 1.2 mL into the muscle every 7 days. With all necessary supplies (Patient taking differently: Inject 240 mg into the muscle every 7 (seven) days. Inject 1 mL into the muscle every 7 days. With all necessary supplies) 10 mL 2   No facility-administered medications prior to visit.    No Known Allergies  Review of Systems  Constitutional: Positive for malaise/fatigue. Negative for fever.  HENT: Negative for  congestion.   Eyes: Negative for blurred vision.  Respiratory: Negative for shortness of breath.   Cardiovascular: Negative for chest pain, palpitations and leg swelling.  Gastrointestinal: Negative for nausea, abdominal pain and blood in stool.  Genitourinary: Negative for dysuria and frequency.  Musculoskeletal: Positive for myalgias. Negative for falls.  Skin: Negative  for rash.  Neurological: Positive for sensory change and focal weakness. Negative for dizziness, loss of consciousness and headaches.  Endo/Heme/Allergies: Negative for environmental allergies.  Psychiatric/Behavioral: Negative for depression. The patient is not nervous/anxious.        Objective:    Physical Exam  Constitutional: He is oriented to person, place, and time. He appears well-developed and well-nourished. No distress.  HENT:  Head: Normocephalic and atraumatic.  Nose: Nose normal.  Eyes: Right eye exhibits no discharge. Left eye exhibits no discharge.  Neck: Normal range of motion. Neck supple.  Cardiovascular: Normal rate and regular rhythm.   No murmur heard. Pulmonary/Chest: Effort normal and breath sounds normal.  Abdominal: Soft. Bowel sounds are normal. There is no tenderness.  Musculoskeletal: He exhibits no edema.  Neurological: He is alert and oriented to person, place, and time. He displays abnormal reflex. He exhibits abnormal muscle tone. Coordination abnormal.  Spasticity and unsteady gait.   Skin: Skin is warm and dry.  Psychiatric: He has a normal mood and affect.  Nursing note and vitals reviewed.   BP 108/72 mmHg  Pulse 61  Temp(Src) 98.2 F (36.8 C) (Oral)  Ht 6\' 3"  (1.905 m)  Wt 278 lb 2 oz (126.157 kg)  BMI 34.76 kg/m2  SpO2 94% Wt Readings from Last 3 Encounters:  06/13/16 278 lb 2 oz (126.157 kg)  03/28/16 281 lb (127.461 kg)  02/11/16 281 lb 8 oz (127.688 kg)     Lab Results  Component Value Date   WBC 7.0 06/13/2016   HGB 14.3 06/13/2016   HCT 43.3 06/13/2016     PLT 184.0 06/13/2016   GLUCOSE 85 06/13/2016   CHOL 149 06/13/2016   TRIG 32.0 06/13/2016   HDL 63.50 06/13/2016   LDLCALC 80 06/13/2016   ALT 25 06/13/2016   AST 18 06/13/2016   NA 140 06/13/2016   K 4.3 06/13/2016   CL 104 06/13/2016   CREATININE 0.95 06/13/2016   BUN 12 06/13/2016   CO2 36* 06/13/2016   TSH 1.51 06/13/2016   PSA 0.52 02/11/2016   INR 1.00 04/21/2011    Lab Results  Component Value Date   TSH 1.51 06/13/2016   Lab Results  Component Value Date   WBC 7.0 06/13/2016   HGB 14.3 06/13/2016   HCT 43.3 06/13/2016   MCV 87.0 06/13/2016   PLT 184.0 06/13/2016   Lab Results  Component Value Date   NA 140 06/13/2016   K 4.3 06/13/2016   CO2 36* 06/13/2016   GLUCOSE 85 06/13/2016   BUN 12 06/13/2016   CREATININE 0.95 06/13/2016   BILITOT 0.7 06/13/2016   ALKPHOS 52 06/13/2016   AST 18 06/13/2016   ALT 25 06/13/2016   PROT 6.5 06/13/2016   ALBUMIN 4.2 06/13/2016   CALCIUM 9.3 06/13/2016   ANIONGAP 10 12/07/2015   GFR 91.83 06/13/2016   Lab Results  Component Value Date   CHOL 149 06/13/2016   Lab Results  Component Value Date   HDL 63.50 06/13/2016   Lab Results  Component Value Date   LDLCALC 80 06/13/2016   Lab Results  Component Value Date   TRIG 32.0 06/13/2016   Lab Results  Component Value Date   CHOLHDL 2 06/13/2016   No results found for: HGBA1C     Assessment & Plan:   Problem List Items Addressed This Visit    Coronary atherosclerosis of native coronary artery   Relevant Medications   nitroGLYCERIN (NITROSTAT) 0.4 MG SL tablet   Other  Relevant Orders   TSH (Completed)   CBC (Completed)   Lipid panel (Completed)   Comprehensive metabolic panel (Completed)   Testosterone (Completed)   MS (multiple sclerosis) (HCC)    Continues to follow with neurology and also continues to work. He will consider a new electric wheelchair. Consider [employer].      Hyperlipidemia    Encouraged heart healthy diet, increase  exercise, avoid trans fats, consider a krill oil cap daily      Relevant Medications   nitroGLYCERIN (NITROSTAT) 0.4 MG SL tablet   Other Relevant Orders   TSH (Completed)   CBC (Completed)   Lipid panel (Completed)   Comprehensive metabolic panel (Completed)   Testosterone (Completed)   Hypothyroidism    On Levothyroxine, continue to monitor      HTN (hypertension) - Primary    Well controlled, no changes to meds. Encouraged heart healthy diet such as the DASH diet and exercise as tolerated.       Relevant Medications   nitroGLYCERIN (NITROSTAT) 0.4 MG SL tablet   Other Relevant Orders   TSH (Completed)   CBC (Completed)   Lipid panel (Completed)   Comprehensive metabolic panel (Completed)   Testosterone (Completed)   Obesity   Relevant Orders   TSH (Completed)   CBC (Completed)   Lipid panel (Completed)   Comprehensive metabolic panel (Completed)   Testosterone (Completed)   Reflux    Avoid offending foods, start probiotics. Do not eat large meals in late evening and consider raising head of bed.       Hypotestosteronism    Has not taken his tesotserone for several weeks and his number is only mildly low. Given his risk factors will hold off on meds for now.      Relevant Orders   TSH (Completed)   CBC (Completed)   Lipid panel (Completed)   Comprehensive metabolic panel (Completed)   Testosterone (Completed)      I have discontinued Mr. Fraticelli testosterone cypionate. I have also changed his nitroGLYCERIN. Additionally, I am having him maintain his cetirizine, aspirin EC, atorvastatin, ibuprofen, BACLOFEN IT, LORazepam, gabapentin, natalizumab, clobetasol cream, DULoxetine, cyclobenzaprine, clonazePAM, olmesartan, tamsulosin, SYNTHROID, methylphenidate, Clonidine HCl, and furosemide.  Meds ordered this encounter  Medications  . nitroGLYCERIN (NITROSTAT) 0.4 MG SL tablet    Sig: Place 1 tablet (0.4 mg total) under the tongue every 5 (five) minutes as needed  for chest pain (x 3 doses).    Dispense:  30 tablet    Refill:  0     [name], MD

## 2016-06-22 NOTE — Assessment & Plan Note (Signed)
Has not taken his tesotserone for several weeks and his number is only mildly low. Given his risk factors will hold off on meds for now.

## 2016-07-18 ENCOUNTER — Encounter: Payer: Self-pay | Admitting: *Deleted

## 2016-07-23 ENCOUNTER — Ambulatory Visit (INDEPENDENT_AMBULATORY_CARE_PROVIDER_SITE_OTHER): Payer: Managed Care, Other (non HMO) | Admitting: Psychology

## 2016-07-23 DIAGNOSIS — F4323 Adjustment disorder with mixed anxiety and depressed mood: Secondary | ICD-10-CM

## 2016-07-28 ENCOUNTER — Telehealth: Payer: Self-pay | Admitting: *Deleted

## 2016-07-28 NOTE — Telephone Encounter (Signed)
Received Plan of Care paperwork via fax from [facility].  Placed in folder for [name] to review and sign.//AB/CMA

## 2016-07-30 NOTE — Telephone Encounter (Signed)
Received completed and signed forms from [name].  All forms faxed on (07/28/16)to [employer] at [phone]).  Confirmation received.//AB/CMA

## 2016-08-08 ENCOUNTER — Ambulatory Visit (INDEPENDENT_AMBULATORY_CARE_PROVIDER_SITE_OTHER): Payer: Managed Care, Other (non HMO) | Admitting: Psychology

## 2016-08-08 DIAGNOSIS — F4323 Adjustment disorder with mixed anxiety and depressed mood: Secondary | ICD-10-CM | POA: Diagnosis not present

## 2016-08-19 ENCOUNTER — Other Ambulatory Visit: Payer: Self-pay | Admitting: Neurology

## 2016-08-19 ENCOUNTER — Telehealth: Payer: Self-pay | Admitting: Neurology

## 2016-08-19 MED ORDER — METHYLPHENIDATE HCL 10 MG PO TABS: ORAL_TABLET | ORAL | 0 refills | Status: DC

## 2016-08-19 NOTE — Telephone Encounter (Signed)
Noted/fim 

## 2016-08-19 NOTE — Telephone Encounter (Signed)
Ritalin rx. up front [facility]/fim 

## 2016-08-19 NOTE — Telephone Encounter (Signed)
I printed out #[phone] and will sign

## 2016-08-19 NOTE — Telephone Encounter (Signed)
CALLED PT TO MAKE APPT CHANGE DUE TO TEMPLATE CHANGES AND PT STATED LAST TIME HE WAS HERE FOR INFUSION AND "DR. SATER AND FAITH WERE NOT THERE.  MY SCRIPT SAID 3 PILLS 4X DAILY AND SCRIPT WAS WRITTEN AS QUANTITY 90 INSTEAD OF 120.  I'M COMING IN Friday AND WANT TO MAKE SURE THE SCRIPT GETS CORRECTED OR MY NEW SCRIPT THAT SHOULD BE UP FRONT 08/22/16."

## 2016-08-29 ENCOUNTER — Ambulatory Visit (INDEPENDENT_AMBULATORY_CARE_PROVIDER_SITE_OTHER): Payer: Managed Care, Other (non HMO) | Admitting: Psychology

## 2016-08-29 DIAGNOSIS — F4323 Adjustment disorder with mixed anxiety and depressed mood: Secondary | ICD-10-CM

## 2016-09-04 ENCOUNTER — Other Ambulatory Visit: Payer: Self-pay | Admitting: Cardiovascular Disease

## 2016-09-04 ENCOUNTER — Other Ambulatory Visit: Payer: Self-pay | Admitting: Neurology

## 2016-09-04 DIAGNOSIS — I10 Essential (primary) hypertension: Secondary | ICD-10-CM

## 2016-09-04 DIAGNOSIS — E785 Hyperlipidemia, unspecified: Secondary | ICD-10-CM

## 2016-09-04 DIAGNOSIS — I2510 Atherosclerotic heart disease of native coronary artery without angina pectoris: Secondary | ICD-10-CM

## 2016-09-19 ENCOUNTER — Telehealth: Payer: Self-pay | Admitting: *Deleted

## 2016-09-19 DIAGNOSIS — R269 Unspecified abnormalities of gait and mobility: Secondary | ICD-10-CM

## 2016-09-19 DIAGNOSIS — G35 Multiple sclerosis: Secondary | ICD-10-CM

## 2016-09-19 MED ORDER — TAMSULOSIN HCL 0.4 MG PO CAPS: ORAL_CAPSULE | ORAL | 3 refills | Status: DC

## 2016-09-19 MED ORDER — METHYLPHENIDATE HCL 10 MG PO TABS: ORAL_TABLET | ORAL | 0 refills | Status: DC

## 2016-09-19 NOTE — Telephone Encounter (Signed)
Ritalin and Flomax r/f during infusion visit.  Pt. expresses need for a power w/c.  [facility]fim

## 2016-09-26 ENCOUNTER — Telehealth: Payer: Self-pay | Admitting: *Deleted

## 2016-09-26 NOTE — Telephone Encounter (Signed)
Received Plan of Care forms via fax from [facility].  Forms completed and faxed to [facility].  Confirmation received  Sent for scanning.//AB/CMA

## 2016-09-26 NOTE — Telephone Encounter (Signed)
Received Plan of Care forms via fax from [facility].  Forms completed and faxed to [facility].  Confirmation received and forms sent for scanning.//AB/CMA

## 2016-10-02 ENCOUNTER — Telehealth: Payer: Self-pay | Admitting: *Deleted

## 2016-10-02 ENCOUNTER — Other Ambulatory Visit: Payer: Self-pay | Admitting: Family Medicine

## 2016-10-02 MED ORDER — GABAPENTIN 300 MG PO CAPS: ORAL_CAPSULE | ORAL | 3 refills | Status: DC

## 2016-10-02 NOTE — Telephone Encounter (Signed)
90 day rx. for Gabapentin escribed to [facility] per faxed request/fim 

## 2016-10-03 ENCOUNTER — Ambulatory Visit (INDEPENDENT_AMBULATORY_CARE_PROVIDER_SITE_OTHER): Payer: Managed Care, Other (non HMO) | Admitting: Psychology

## 2016-10-03 DIAGNOSIS — F4323 Adjustment disorder with mixed anxiety and depressed mood: Secondary | ICD-10-CM | POA: Diagnosis not present

## 2016-10-06 ENCOUNTER — Other Ambulatory Visit: Payer: Self-pay | Admitting: *Deleted

## 2016-10-06 ENCOUNTER — Ambulatory Visit (INDEPENDENT_AMBULATORY_CARE_PROVIDER_SITE_OTHER): Payer: Managed Care, Other (non HMO) | Admitting: Neurology

## 2016-10-06 ENCOUNTER — Encounter: Payer: Self-pay | Admitting: Neurology

## 2016-10-06 VITALS — BP 110/74 | HR 86 | Resp 16 | Ht 75.0 in | Wt 278.0 lb

## 2016-10-06 DIAGNOSIS — G35 Multiple sclerosis: Secondary | ICD-10-CM

## 2016-10-06 DIAGNOSIS — R269 Unspecified abnormalities of gait and mobility: Secondary | ICD-10-CM

## 2016-10-06 DIAGNOSIS — R252 Cramp and spasm: Secondary | ICD-10-CM | POA: Diagnosis not present

## 2016-10-06 DIAGNOSIS — R208 Other disturbances of skin sensation: Secondary | ICD-10-CM

## 2016-10-06 DIAGNOSIS — R5383 Other fatigue: Secondary | ICD-10-CM

## 2016-10-06 NOTE — Progress Notes (Signed)
HPI    Multiple Sclerosis   Additional comments: Pump refill.   Pump interrogated.  Skin cleaned with Betadine.  Using sterile technique, pump reservoir accessed.  12.185ml clear fluid withdrawn and wasted in sink.  40ml pump med obtained from [facility] instilled into his pump.  10 ml instilled, then withdrawn.  Med remained clear, full 10ml back, so I then instilled all of the 40ml into pump.  New AD 04-13-16.  ERI 55 mos. He would like to discuss his need for an electric w/c.  Sts. left leg is some weaker than right./fim     Last edited by Hayden PedroFaith I Misenheimer, RN on 10/06/2016  2:46 PM. (History)      HPI    Multiple Sclerosis   Additional comments: Pump refill.   Pump interrogated.  Skin cleaned with Betadine.  Using sterile technique, pump reservoir accessed.  12.395ml clear fluid withdrawn and wasted in sink.  40ml pump med obtained from [facility] instilled into his pump.  10 ml instilled, then withdrawn.  Med remained clear, full 10ml back, so I then instilled all of the 40ml into pump.  New AD 04-13-16.  ERI 55 mos. He would like to discuss his need for an electric w/c.  Sts. left leg is some weaker than right./fim     Last edited by Hayden PedroFaith I Misenheimer, RN on 10/06/2016  2:46 PM. (History)     Chief Complaint    Multiple Sclerosis; Gait Disturbance      HISTORY:  Keith Morrow is a 45 year old man with a relapsing form of multiple sclerosis who has right greater than left leg weakness and progressive difficulties with gait function and spasticity.  He is currently on Tysabri. He tolerates it well.  Due to continuing difficulties with his gait, he will require an electric wheelchair.  Gait:   Keith Morrow currently is using a Rollator walker but it has become more difficult for him to do his activities of daily living. Specifically, he is having more difficulty walking longer distances or walking when he is tired. Both legs are dragging more. He has always been weaker on the right leg but the left leg is  becoming weaker as well.   Besides getting from place to place, other activities of daily living that he is having difficulty with are standing to do tasks around the house and at work.     He is having difficulties doing his activities of daily living while using a walker.   Therefore a cane or other types of walker would not be of benefit.    A standard or lightweight wheelchair meet his ADL needs due to fatigue.    Therefore, he will need a powered operated vehicle. A scooter will not meet his needs due to the large turning radius and inability to have his hands free to perform ADL's due to the tiller and increased difficulty getting on and off the vehicle..   Therefore, he will need to have an electric wheelchair. He has the cognitive abilities to operate this type of vehicle safely and is motivated to do so.  Spasticity:   His spasticity is improved with the combination of baclofen and clonidine in his pump.   Spasms are related pain are better though his legs have felt a little bit weaker in the past couple of months.      PHYSICAL EXAM  General: The patient is well-developed and well-nourished and in no acute distress.  Extremities:   No rash or edema.  Neurologic Exam  Mental status: The patient is alert and oriented x 3 at the time of the examination. Intact attention and recall.   Speech is normal.  CN:   Extraocular muscles are normal.  No ptosis.    Symmetric facial strength and sensation.    PE/TP midline  Motor: Muscle bulk is normal. Tone is increased in right arm and leg. Strength is 4+ / 5 in the right arm, 5/5 in the left arm, 2+/5 in the right leg 3 to 4-/5 on the left.   Gait and station: He needs unilateral support to stand and bilateral support to walk.    Reflexes: Deep tendon reflexes are 1 and symmetric bilaterally.   ASSESSMENT  MS (multiple sclerosis) (HCC)  Gait disturbance  Spasticity  Fatigue, unspecified type  Dysesthesia    PLAN: 1.    Prescription for electric wheelchair. 2.   Refill pump with 3600 mg baclofen and 720 mg clonidine and reduce basal rate by 5% 3.   Continue Tysabri.   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  4.  Return as scheduled or sooner if there are new or worsening neurologic issues.  We can make further adjustments if needed.  Keith Rotan A. Epimenio Foot, MD, PhD Certified in Neurology, Clinical Neurophysiology, Sleep Medicine, Pain Medicine and Neuroimaging  [facility] [address] [place], [place] [address] [phone]

## 2016-10-09 ENCOUNTER — Telehealth: Payer: Self-pay

## 2016-10-09 NOTE — Telephone Encounter (Signed)
[facility] Forms completed, signed and faxed to [phone]. Forms sent for scanning.

## 2016-10-13 ENCOUNTER — Ambulatory Visit: Payer: Self-pay | Admitting: Family Medicine

## 2016-10-13 ENCOUNTER — Other Ambulatory Visit: Payer: Self-pay | Admitting: Neurology

## 2016-10-13 DIAGNOSIS — R269 Unspecified abnormalities of gait and mobility: Secondary | ICD-10-CM

## 2016-10-13 DIAGNOSIS — R252 Cramp and spasm: Secondary | ICD-10-CM

## 2016-10-13 DIAGNOSIS — G35 Multiple sclerosis: Secondary | ICD-10-CM

## 2016-10-17 ENCOUNTER — Telehealth: Payer: Self-pay | Admitting: Neurology

## 2016-10-17 ENCOUNTER — Other Ambulatory Visit: Payer: Self-pay | Admitting: Neurology

## 2016-10-17 DIAGNOSIS — R29898 Other symptoms and signs involving the musculoskeletal system: Secondary | ICD-10-CM | POA: Insufficient documentation

## 2016-10-17 DIAGNOSIS — G35 Multiple sclerosis: Secondary | ICD-10-CM

## 2016-10-17 MED ORDER — METHYLPHENIDATE HCL 10 MG PO TABS: ORAL_TABLET | ORAL | 0 refills | Status: DC

## 2016-10-17 NOTE — Progress Notes (Signed)
Refill meds

## 2016-10-17 NOTE — Telephone Encounter (Signed)
I have spoken with Keith Morrow this morning.  I have not received any forms from [facility].  I spoke with Lyla Son at [facility] and requested any forms [facility] needs to sign be faxed to me at [phone]

## 2016-10-17 NOTE — Telephone Encounter (Signed)
[facility], signed, and faxed back/fim

## 2016-10-17 NOTE — Telephone Encounter (Signed)
Patient called to advise [facility] in [place] Phone [phone] in Rehab Dept, is waiting for Dr. Bonnita Hollow signature on paperwork that was faxed to our office several days ago for power wheelchair. Patient states his goal is to get this accomplished before his insurance rolls over December 31st. Patient states he is sitting in our parking lot and can come back in to wait. Patient advised Faith can call him when this has been taken care of.

## 2016-10-24 ENCOUNTER — Ambulatory Visit: Payer: Managed Care, Other (non HMO) | Admitting: Psychology

## 2016-10-25 ENCOUNTER — Encounter: Payer: Self-pay | Admitting: Neurology

## 2016-10-28 ENCOUNTER — Other Ambulatory Visit: Payer: Self-pay | Admitting: Family Medicine

## 2016-10-28 ENCOUNTER — Encounter: Payer: Self-pay | Admitting: Family Medicine

## 2016-10-28 MED ORDER — CLOTRIMAZOLE-BETAMETHASONE 1-0.05 % EX CREA: 1.0000 "application " | TOPICAL_CREAM | Freq: Two times a day (BID) | CUTANEOUS | 0 refills | Status: DC | PRN

## 2016-10-28 MED ORDER — CLOBETASOL PROPIONATE 0.05 % EX CREA: 1.0000 "application " | TOPICAL_CREAM | Freq: Two times a day (BID) | CUTANEOUS | 1 refills | Status: DC

## 2016-11-05 ENCOUNTER — Telehealth: Payer: Self-pay | Admitting: Neurology

## 2016-11-05 NOTE — Telephone Encounter (Signed)
I have spoken with Keith Morrow this afternoon.  He c/o visual disturbance onset one week ago.  Sts. everything looks like it has a hole, or divot in it.  Sts. only one spot of blurry vision right eye. Not the whole eye.  No eye pain. No double vision/fim

## 2016-11-05 NOTE — Telephone Encounter (Signed)
I have spoken with Keith Morrow again and per RAS, explained that as visual disturbance is unilateral, he may have an optic neuritis, but since he is on Tysabri, and disturbance is mild at this time, have been ongoing for a week, he doesn't feel tx. with steroids is necessary.  If sx. persist or worsen, he should call our office back.  He verbalized understanding of same/fim

## 2016-11-05 NOTE — Telephone Encounter (Signed)
Pt says it appears he is having optics neuritis in the rt eye. Please call

## 2016-11-07 ENCOUNTER — Ambulatory Visit (INDEPENDENT_AMBULATORY_CARE_PROVIDER_SITE_OTHER): Payer: Managed Care, Other (non HMO) | Admitting: Psychology

## 2016-11-07 DIAGNOSIS — F4323 Adjustment disorder with mixed anxiety and depressed mood: Secondary | ICD-10-CM | POA: Diagnosis not present

## 2016-11-13 ENCOUNTER — Ambulatory Visit (INDEPENDENT_AMBULATORY_CARE_PROVIDER_SITE_OTHER): Payer: Managed Care, Other (non HMO) | Admitting: Family Medicine

## 2016-11-13 ENCOUNTER — Telehealth: Payer: Self-pay | Admitting: *Deleted

## 2016-11-13 ENCOUNTER — Encounter: Payer: Self-pay | Admitting: Family Medicine

## 2016-11-13 DIAGNOSIS — G35 Multiple sclerosis: Secondary | ICD-10-CM

## 2016-11-13 DIAGNOSIS — F418 Other specified anxiety disorders: Secondary | ICD-10-CM | POA: Diagnosis not present

## 2016-11-13 DIAGNOSIS — Z23 Encounter for immunization: Secondary | ICD-10-CM

## 2016-11-13 DIAGNOSIS — I10 Essential (primary) hypertension: Secondary | ICD-10-CM | POA: Diagnosis not present

## 2016-11-13 DIAGNOSIS — E349 Endocrine disorder, unspecified: Secondary | ICD-10-CM | POA: Diagnosis not present

## 2016-11-13 DIAGNOSIS — H539 Unspecified visual disturbance: Secondary | ICD-10-CM

## 2016-11-13 DIAGNOSIS — K219 Gastro-esophageal reflux disease without esophagitis: Secondary | ICD-10-CM

## 2016-11-13 DIAGNOSIS — R278 Other lack of coordination: Secondary | ICD-10-CM

## 2016-11-13 HISTORY — DX: Unspecified visual disturbance: H53.9

## 2016-11-13 LAB — COMPREHENSIVE METABOLIC PANEL
ALT: 19 U/L (ref 0–53)
AST: 16 U/L (ref 0–37)
Albumin: 4.4 g/dL (ref 3.5–5.2)
Alkaline Phosphatase: 58 U/L (ref 39–117)
BUN: 16 mg/dL (ref 6–23)
CO2: 35 mEq/L — ABNORMAL HIGH (ref 19–32)
Calcium: 9.2 mg/dL (ref 8.4–10.5)
Chloride: 104 mEq/L (ref 96–112)
Creatinine, Ser: 0.86 mg/dL (ref 0.40–1.50)
GFR: 102.81 mL/min (ref >60.00)
Glucose, Bld: 96 mg/dL (ref 70–99)
Potassium: 4.1 mEq/L (ref 3.5–5.1)
Sodium: 143 mEq/L (ref 135–145)
Total Bilirubin: 0.6 mg/dL (ref 0.2–1.2)
Total Protein: 6.9 g/dL (ref 6.0–8.3)

## 2016-11-13 LAB — CBC
HCT: 42.2 % (ref 39.0–52.0)
Hemoglobin: 14.2 g/dL (ref 13.0–17.0)
MCHC: 33.7 g/dL (ref 30.0–36.0)
MCV: 90.9 fl (ref 78.0–100.0)
Platelets: 187 10*3/uL (ref 150.0–400.0)
RBC: 4.64 Mil/uL (ref 4.22–5.81)
RDW: 14.1 % (ref 11.5–15.5)
WBC: 6.7 10*3/uL (ref 4.0–10.5)

## 2016-11-13 LAB — TESTOSTERONE: Testosterone: 247.17 ng/dL — ABNORMAL LOW (ref 300.00–890.00)

## 2016-11-13 LAB — TSH: TSH: 0.64 u[IU]/mL (ref 0.35–4.50)

## 2016-11-13 MED ORDER — METHYLPHENIDATE HCL 10 MG PO TABS: ORAL_TABLET | ORAL | 0 refills | Status: DC

## 2016-11-13 NOTE — Assessment & Plan Note (Signed)
Encouraged DASH diet, decrease po intake and increase exercise as tolerated. Needs 7-8 hours of sleep nightly. Avoid trans fats, eat small, frequent meals every 4-5 hours with lean proteins, complex carbs and healthy fats. Minimize simple carbs 

## 2016-11-13 NOTE — Addendum Note (Signed)
Addended by: Candis SchatzMISENHEIMER, Darrow Barreiro I on: 11/13/2016 08:25 AM   Modules accepted: Orders

## 2016-11-13 NOTE — Assessment & Plan Note (Signed)
Has not taken his testosterone since last visit. Will check level today

## 2016-11-13 NOTE — Assessment & Plan Note (Signed)
Patient reports a 1 week history of right eye changes that includes seeing a divit/grey blurry spot in central visual field. Will refer to opthamology for further evaluation

## 2016-11-13 NOTE — Telephone Encounter (Signed)
Ritalin rx. provided during infusion appt./fim

## 2016-11-13 NOTE — Progress Notes (Signed)
Patient ID: Keith Morrow, male   DOB: [birth year], 45 y.o.   MRN: [id number]   Subjective:    Patient ID: Keith Morrow, male    DOB: Dec 23, 1970, 45 y.o.   MRN: [id number]  Chief Complaint  Patient presents with  . 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 No recent hospitalizations or acute illness. Denies CP/palp/SOB/HA/congestion/fevers/GI or GU c/o. Taking meds as prescribed. Still struggles with fatigue, weakness and chronic pain. Does feel the PT is helping his mobility.   Past Medical History:  Diagnosis Date  . Acute bronchitis 05/09/2013  . Allergy   . Bronchitis, acute 12/10/2011  . Chicken pox as a child  . Coronary atherosclerosis of native coronary artery    a. NSTEMI 04/20/11: tx with Promus DES to LAD; bifurcation lesion with oDx 90% tx with POBA; b. staged PCI of right post. AV Branch with Promus DES; c. residual at cath 04/21/11:  AV groove CFX 50%, mRCA 30%,; EF 55%  . Depression   . Depression with anxiety   . Dermatitis, contact 12/10/2011  . Fatigue 10/15/2012  . Heart attack 04-20-11  . HTN (hypertension)   . Hyperlipidemia   . Hypotestosteronism 11/17/2011  . Hypothyroidism   . Keloid scar   . MS (multiple sclerosis) (HCC)   . Neuromuscular disorder (HCC)    NUMBNESS/TINGLING  . Obesity   . Onychomycosis   . Preventative health care 10/07/2011  . Reflux 10/07/2011  . Sun-damaged skin 02/17/2016  . Testosterone deficiency 01/16/2012  . Tinea pedis   . 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 Visual changes 11/13/2016    Past Surgical History:  Procedure Laterality Date  . CORONARY ANGIOPLASTY WITH STENT PLACEMENT     2012  . PAIN PUMP IMPLANTATION N/A 08/29/2014   Procedure: Baclofen pump placement;  Surgeon: Maeola Harman, MD;  Location: [facility];  Service: Neurosurgery;  Laterality:  N/A;  Baclofen pump placement  . PROGRAMABLE BACLOFEN PUMP REVISION  08/29/14  . right wrist surgery     CTR  . VASCULAR SURGERY     vericose vein in right femoral area  . VASECTOMY    . WISDOM TOOTH EXTRACTION      Family History  Problem Relation Age of Onset  . Coronary artery disease      questionable in father  . Heart attack Mother 77  . Thyroid disease Mother   . Heart disease Mother   . Thyroid disease Sister   . Thyroid disease Brother   . Heart disease Maternal Grandmother   . Hypertension Maternal Grandmother   . Hyperlipidemia Maternal Grandmother   . Heart disease Maternal Grandfather   . Hypertension Maternal Grandfather   . Hyperlipidemia Maternal Grandfather   . Stroke Paternal Grandfather   . Thyroid disease Sister   . Heart disease Father     2 stents    Social History   Social History  . Marital status: Married    Spouse name: N/A  . Number of children: 3  . Years of education: N/A   Occupational History  . Not on file.   Social History Main Topics  . Smoking status: Never Smoker  . Smokeless tobacco: Never Used  . Alcohol use Yes     Comment: socially  . Drug use: No  .  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 Not on file   Social History Narrative  . No narrative on file    Outpatient Medications Prior to Visit  Medication Sig Dispense Refill  . aspirin EC 81 MG tablet Take 81 mg by mouth daily.    Marland Kitchen BACLOFEN IT by Intrathecal route.    . cetirizine (ZYRTEC) 10 MG tablet Take 10 mg by mouth daily as needed for allergies.     . clobetasol cream (TEMOVATE) 0.05 % Apply 1 application topically 2 (two) times daily. 60 g 1  . Clonidine HCl POWD by Does not apply route.    . clotrimazole-betamethasone (LOTRISONE) cream Apply 1 application topically 2 (two) times daily as needed (anti-fungal). 30 g 0  . cyclobenzaprine  (FLEXERIL) 10 MG tablet Take 1 tablet (10 mg total) by mouth 3 (three) times daily as needed for muscle spasms. 90 tablet 11  . dalfampridine (AMPYRA) 10 MG TB12 Take 1 tablet (10 mg total) by mouth 2 (two) times daily. Every 12 hours 60 tablet 11  . DULoxetine (CYMBALTA) 60 MG capsule Take 1 capsule (60 mg total) by mouth 2 (two) times daily. 180 capsule 5  . gabapentin (NEURONTIN) 300 MG capsule One po in am, one po in evening and two po qHS 360 capsule 3  . ibuprofen (ADVIL,MOTRIN) 200 MG tablet Take 200 mg by mouth every 6 (six) hours as needed for moderate pain.    . methylphenidate (RITALIN) 10 MG tablet Take one pill po up to four times a day 120 tablet 0  . natalizumab (TYSABRI) 300 MG/15ML injection Inject 300 mg into the vein every 30 (thirty) days.    . nitroGLYCERIN (NITROSTAT) 0.4 MG SL tablet PLACE 1 TABLET UNDER THE TOUNGE EVERY 5 MINUTES AS NEEDED FOR CHEST PAIN (X 3 DOSES) 25 tablet 0  . olmesartan (BENICAR) 20 MG tablet Take 1 tablet (20 mg total) by mouth daily. 90 tablet 2  . SYNTHROID 150 MCG tablet TAKE ONE TABLET BY MOUTH DAILY BEFORE BREAKFAST 30 tablet 6  . tamsulosin (FLOMAX) 0.4 MG CAPS capsule TAKE ONE CAPSULE (0.4 MG TOTAL) BY MOUTH DAILY. 90 capsule 3  . atorvastatin (LIPITOR) 40 MG tablet TAKE 1 TABLET BY MOUTH (40MG  TOTAL) ONE TIME DAILY 90 tablet 0  . clonazePAM (KLONOPIN) 1 MG tablet TAKE ONE TABLET BY MOUTH FOUR TIMES DAILY 120 tablet 1  . furosemide (LASIX) 20 MG tablet TAKE 1 TABLET (20 MG TOTAL) BY MOUTH DAILY. (Patient not taking: Reported on 11/26/2016) 30 tablet 3  . LORazepam (ATIVAN) 1 MG tablet 4 times a day as needed 120 tablet 3   No facility-administered medications prior to visit.     No Known Allergies  Review of Systems  Constitutional: Negative for fever.  Eyes: Positive for blurred vision. Negative for photophobia and pain.  Respiratory: Negative for cough and shortness of breath.   Cardiovascular: Negative for chest pain and palpitations.    Gastrointestinal: Negative for vomiting.  Musculoskeletal: Negative for back pain.  Skin: Negative for rash.  Neurological: Negative for loss of consciousness and headaches.       Objective:    Physical Exam  Constitutional: He is oriented to person, place, and time. He appears well-developed and well-nourished. No distress.  HENT:  Head: Normocephalic and atraumatic.  Eyes: Conjunctivae are normal.  Neck: Normal range of motion.  No thyromegaly present.  Cardiovascular: Normal rate and regular rhythm.   Pulmonary/Chest: Effort normal and breath sounds normal. He has no wheezes.  Abdominal: Soft. Bowel sounds are normal. There is no tenderness.  Musculoskeletal: Normal range of motion. He exhibits edema.  Trace pedal edema  Neurological: He is alert and oriented to person, place, and time.  Skin: Skin is warm and dry. He is not diaphoretic.  Psychiatric: He has a normal mood and affect.    BP 124/76 (BP Location: Left Arm, Patient Position: Sitting, Cuff Size: Normal)   Pulse 67   Temp 98.3 F (36.8 C) (Oral)   Wt 283 lb (128.4 kg)   SpO2 97%   BMI 35.37 kg/m  Wt Readings from Last 3 Encounters:  11/13/16 283 lb (128.4 kg)  10/06/16 278 lb (126.1 kg)  06/13/16 278 lb 2 oz (126.2 kg)     Lab Results  Component Value Date   WBC 6.7 11/13/2016   HGB 14.2 11/13/2016   HCT 42.2 11/13/2016   PLT 187.0 11/13/2016   GLUCOSE 96 11/13/2016   CHOL 149 06/13/2016   TRIG 32.0 06/13/2016   HDL 63.50 06/13/2016   LDLCALC 80 06/13/2016   ALT 19 11/13/2016   AST 16 11/13/2016   NA 143 11/13/2016   K 4.1 11/13/2016   CL 104 11/13/2016   CREATININE 0.86 11/13/2016   BUN 16 11/13/2016   CO2 35 (H) 11/13/2016   TSH 0.64 11/13/2016   PSA 0.52 02/11/2016   INR 1.00 04/21/2011    Lab Results  Component Value Date   TSH 0.64 11/13/2016   Lab Results  Component Value Date   WBC 6.7 11/13/2016   HGB 14.2 11/13/2016   HCT 42.2 11/13/2016   MCV 90.9 11/13/2016   PLT  187.0 11/13/2016   Lab Results  Component Value Date   NA 143 11/13/2016   K 4.1 11/13/2016   CO2 35 (H) 11/13/2016   GLUCOSE 96 11/13/2016   BUN 16 11/13/2016   CREATININE 0.86 11/13/2016   BILITOT 0.6 11/13/2016   ALKPHOS 58 11/13/2016   AST 16 11/13/2016   ALT 19 11/13/2016   PROT 6.9 11/13/2016   ALBUMIN 4.4 11/13/2016   CALCIUM 9.2 11/13/2016   ANIONGAP 10 12/07/2015   GFR 102.81 11/13/2016   Lab Results  Component Value Date   CHOL 149 06/13/2016   Lab Results  Component Value Date   HDL 63.50 06/13/2016   Lab Results  Component Value Date   LDLCALC 80 06/13/2016   Lab Results  Component Value Date   TRIG 32.0 06/13/2016   Lab Results  Component Value Date   CHOLHDL 2 06/13/2016   No results found for: HGBA1C    I acted as a Neurosurgeon for [name]. Princess, RMA  Assessment & Plan:   Problem List Items Addressed This Visit    HTN (hypertension)    Well controlled, no changes to meds. Encouraged heart healthy diet such as the DASH diet and exercise as tolerated.       Relevant Orders   Comprehensive metabolic panel (Completed)   CBC (Completed)   TSH (Completed)   Depression with anxiety    Doing well with current meds and counseling. Is getting ready to get a motorized wheelchair by end of year. Early next year.      GERD (gastroesophageal reflux disease)    Avoid offending foods, start probiotics. Do not eat large meals in late evening and consider raising head of bed.  Hypotestosteronism    Has not taken his testosterone since last visit. Will check level today      Relevant Orders   Testosterone (Completed)   Visual changes    Patient reports a 1 week history of right eye changes that includes seeing a divit/grey blurry spot in central visual field. Will refer to opthamology for further evaluation      Relevant Orders   Ambulatory referral to Ophthalmology      I am having Mr. Cheryll CockayneVirost maintain his cetirizine, aspirin EC,  ibuprofen, BACLOFEN IT, LORazepam, natalizumab, DULoxetine, cyclobenzaprine, olmesartan, SYNTHROID, Clonidine HCl, furosemide, dalfampridine, tamsulosin, gabapentin, nitroGLYCERIN, clobetasol cream, clotrimazole-betamethasone, and methylphenidate.  No orders of the defined types were placed in this encounter.    Danise EdgeBLYTH, STACEY, MD

## 2016-11-13 NOTE — Assessment & Plan Note (Signed)
Avoid offending foods, start probiotics. Do not eat large meals in late evening and consider raising head of bed.  

## 2016-11-13 NOTE — Progress Notes (Signed)
Pre visit review using our clinic review tool, if applicable. No additional management support is needed unless otherwise documented below in the visit note. 

## 2016-11-13 NOTE — Assessment & Plan Note (Signed)
On Levothyroxine, continue to monitor 

## 2016-11-13 NOTE — Assessment & Plan Note (Signed)
Doing well with current meds and counseling. Is getting ready to get a motorized wheelchair by end of year. Early next year.

## 2016-11-13 NOTE — Assessment & Plan Note (Signed)
Encouraged heart healthy diet, increase exercise, avoid trans fats, consider a krill oil cap daily 

## 2016-11-13 NOTE — Assessment & Plan Note (Signed)
Well controlled, no changes to meds. Encouraged heart healthy diet such as the DASH diet and exercise as tolerated.  °

## 2016-11-13 NOTE — Telephone Encounter (Signed)
Pt. requests OT for decreased dexterity in hands, right worse than left.  Order in EPIC/fim

## 2016-11-13 NOTE — Patient Instructions (Addendum)
Visual Disturbances You have had a disturbance in your vision. This may be caused by various conditions, such as:  Migraines. Migraine headaches are often preceded by a disturbance in vision. Blind spots or light flashes are followed by a headache. This type of visual disturbance is temporary. It does not damage the eye.  Glaucoma. This is caused by increased pressure in the eye. Symptoms include haziness, blurred vision, or seeing rainbow colored circles when looking at bright lights. Partial or complete visual loss can occur. You may or may not experience eye pain. Visual loss may be gradual or sudden and is irreversible. Glaucoma is the leading cause of blindness.  Retina problems. Vision will be reduced if the retina becomes detached or if there is a circulation problem as with diabetes, high blood pressure, or a mini-stroke. Symptoms include seeing "floaters," flashes of light, or shadows, as if a curtain has fallen over your eye.  Optic nerve problems. The main nerve in your eye can be damaged by redness, soreness, and swelling (inflammation), poor circulation, drugs, and toxins. It is very important to have a complete exam done by a specialist to determine the exact cause of your eye problem. The specialist may recommend medicines or surgery, depending on the cause of the problem. This can help prevent further loss of vision or reduce the risk of having a stroke. Contact the caregiver to whom you have been referred and arrange for follow-up care right away. SEEK IMMEDIATE MEDICAL CARE IF:   Your vision gets worse.  You develop severe headaches.  You have any weakness or numbness in the face, arms, or legs.  You have any trouble speaking or walking. This information is not intended to replace advice given to you by your health care provider. Make sure you discuss any questions you have with your health care provider. Document Released: 01/01/2005 Document Revised: 02/16/2012 Document  Reviewed: 05/03/2014 Elsevier Interactive Patient Education  2017 [employer].

## 2016-11-14 ENCOUNTER — Other Ambulatory Visit: Payer: Self-pay | Admitting: Neurology

## 2016-11-14 ENCOUNTER — Encounter: Payer: Self-pay | Admitting: Family Medicine

## 2016-11-15 IMAGING — DX DG CHEST 2V
2 series · 3 of 3 positions shown · non-contrast
Comparison: 01/03/2015 chest radiograph

CLINICAL DATA: Shortness of breath.

EXAM:
CHEST  2 VIEW

[Series 1: chest pa · 0.14mm/px · 2 of 2 slices shown]
[im 1/2]
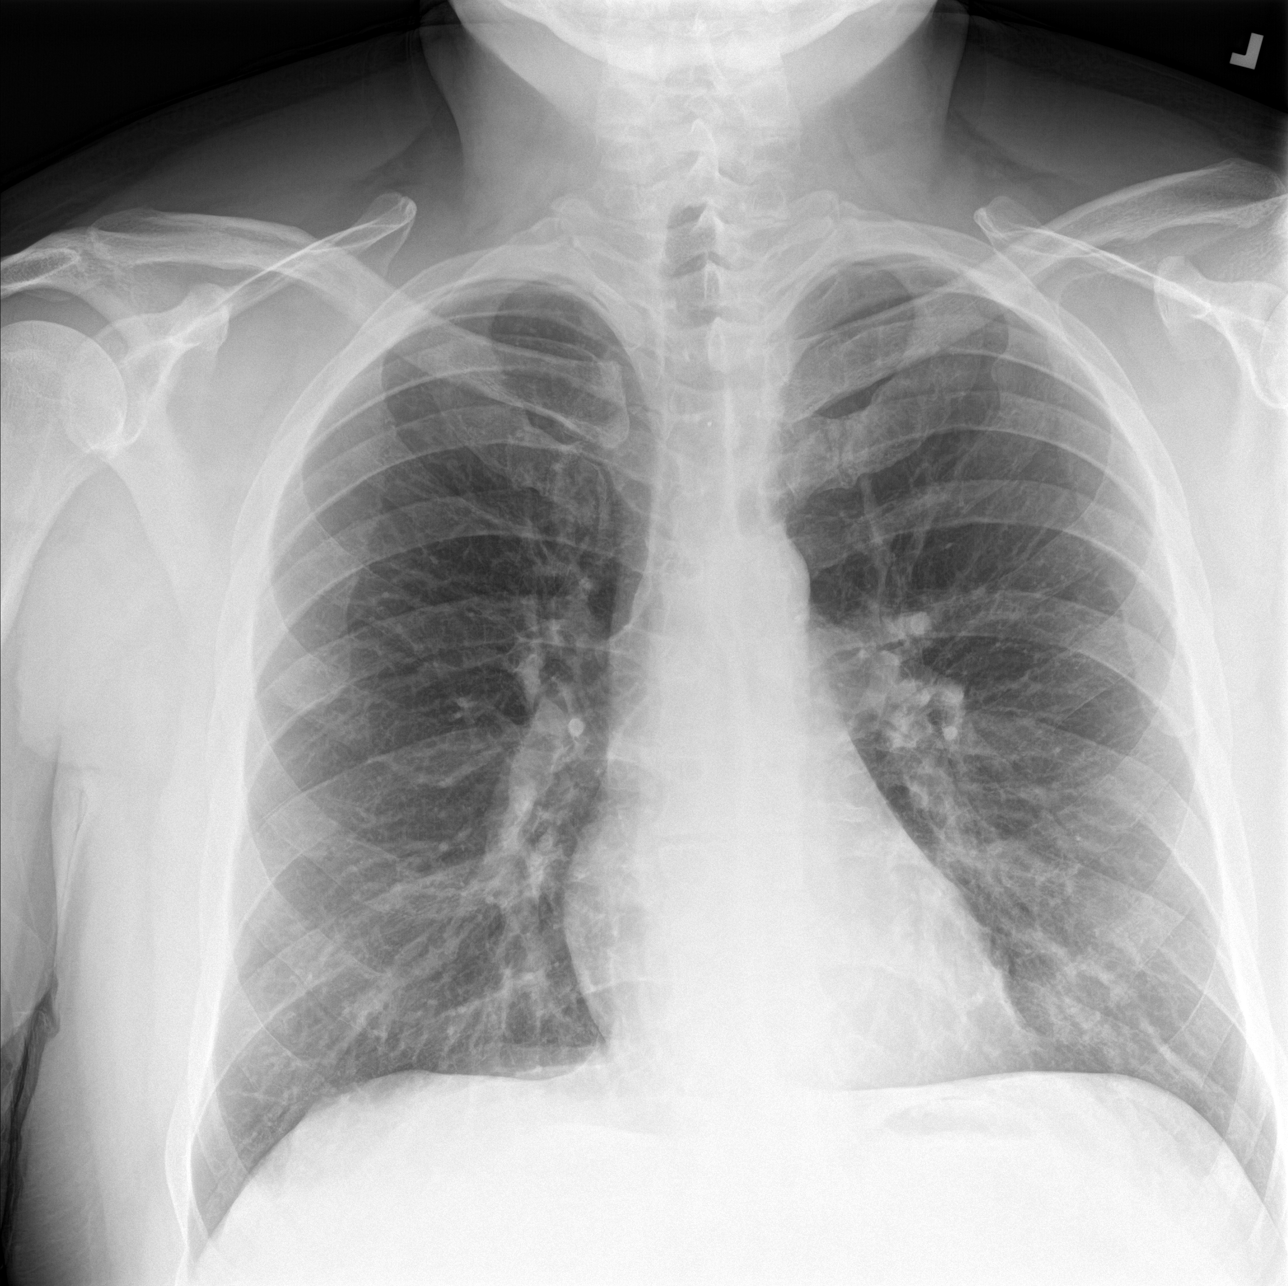
[im 2/2]
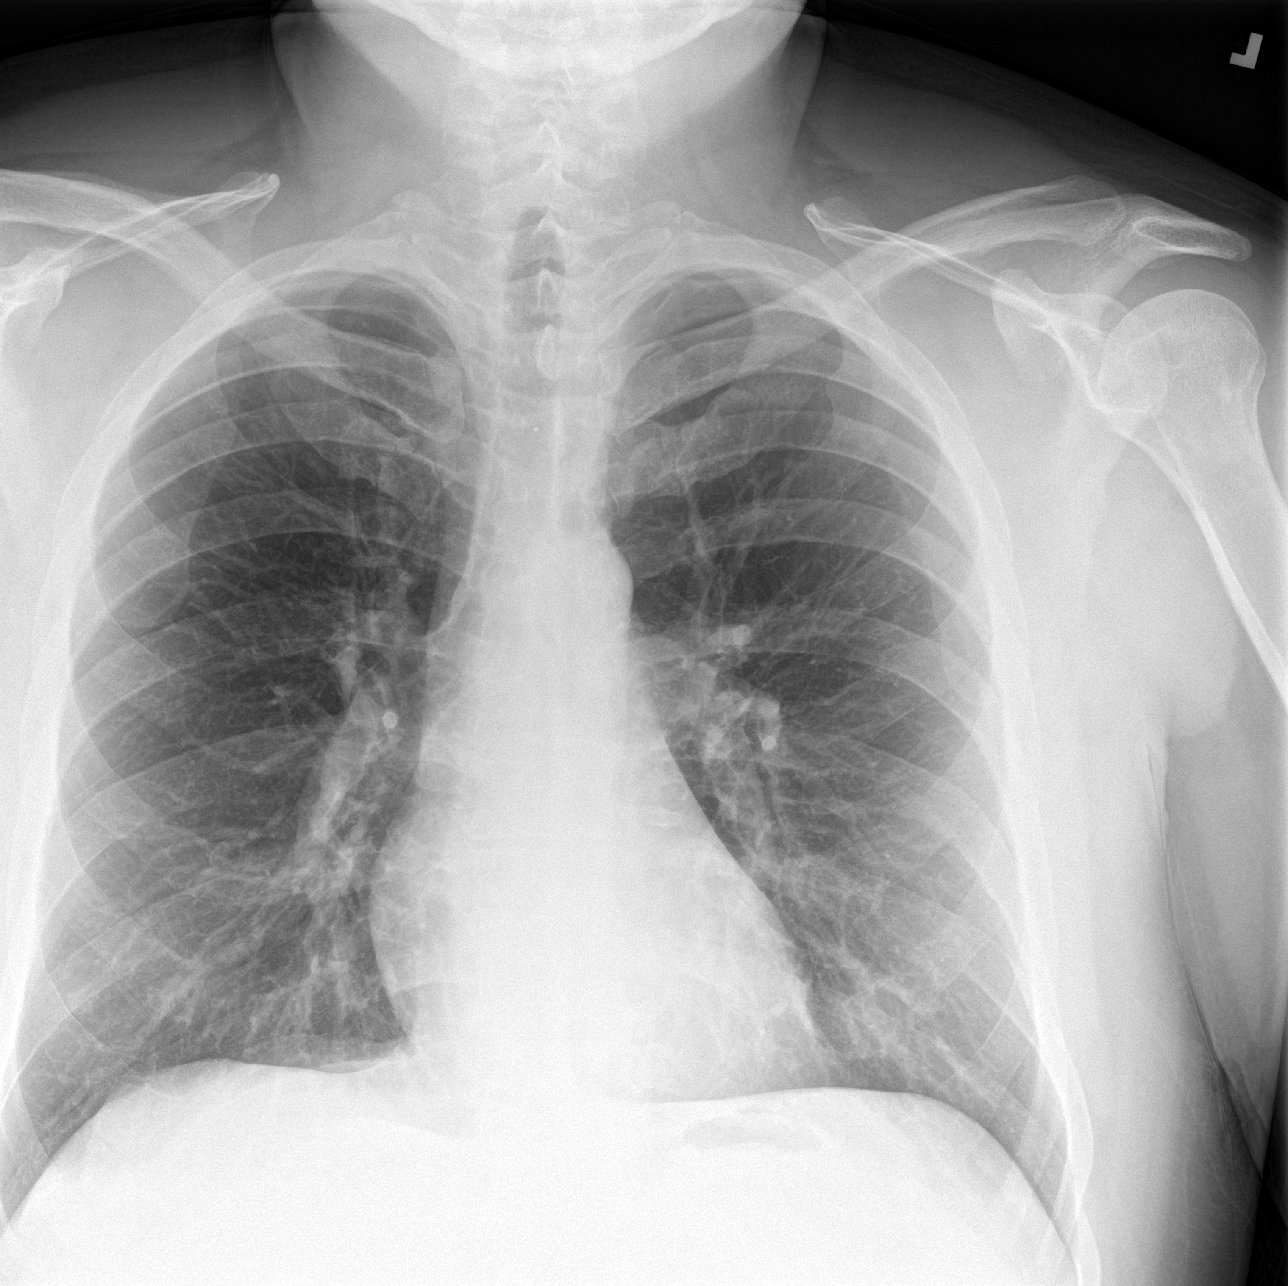

[chest lat]
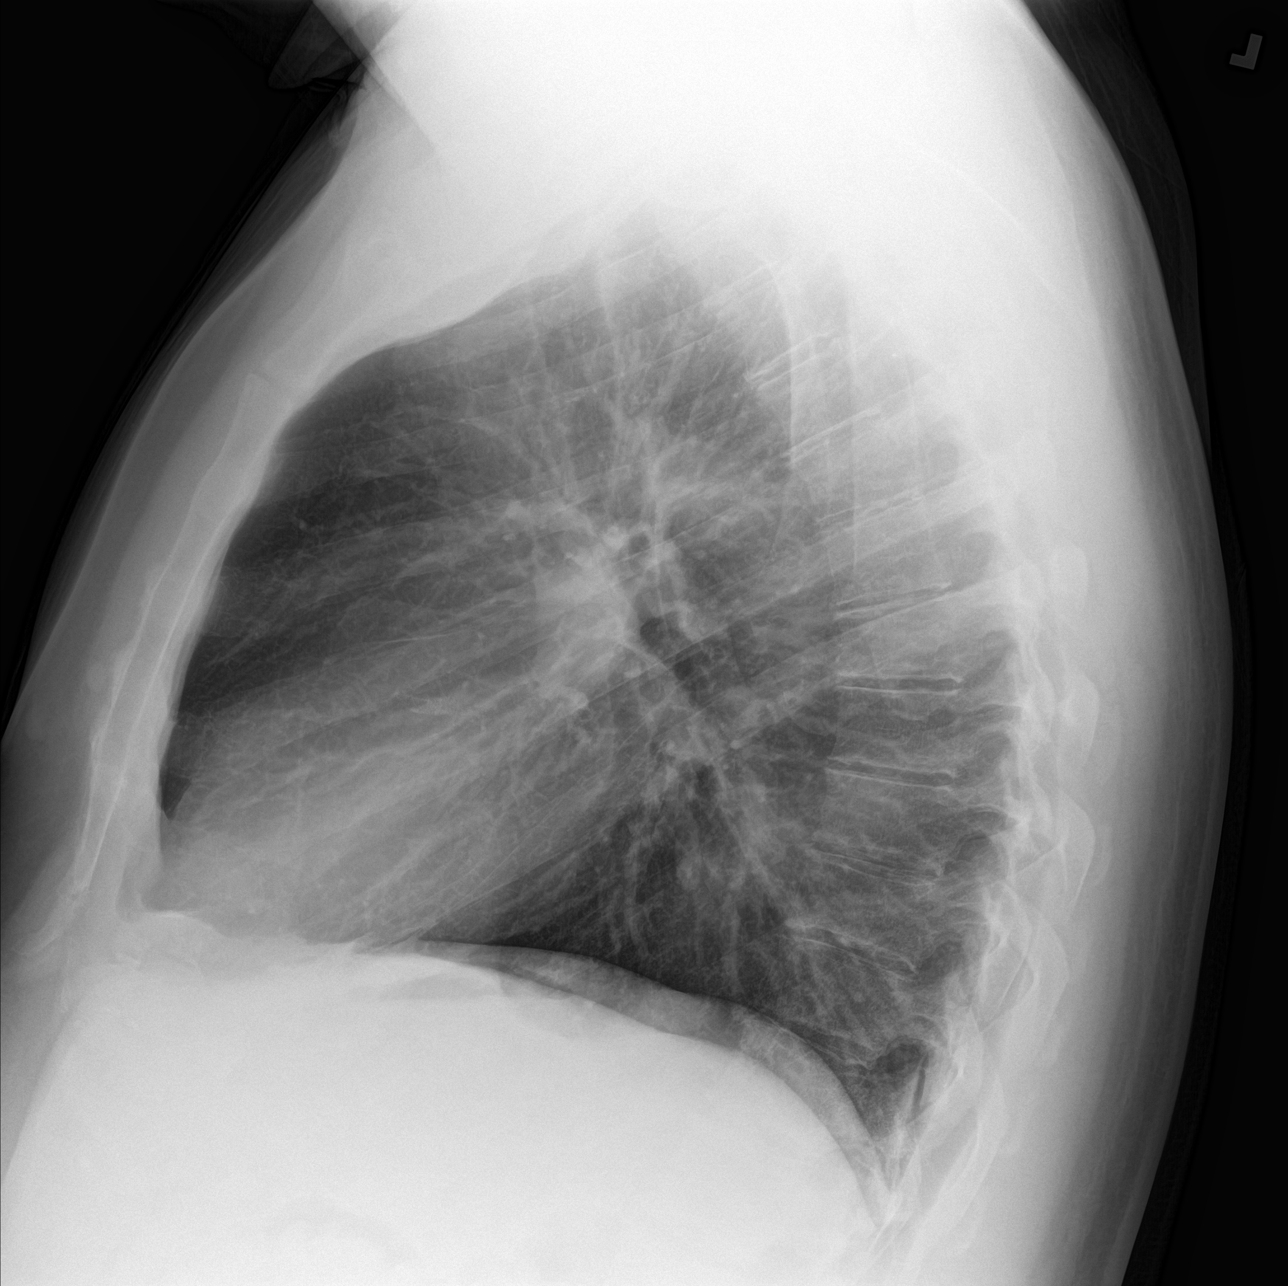

[3 of 3 positions shown; findings below may reference images not displayed]

FINDINGS: Stable cardiomediastinal silhouette with normal heart size. No
pneumothorax. No pleural effusion. Clear lungs, with no focal lung
consolidation and no pulmonary edema.
IMPRESSION: No active cardiopulmonary disease.

## 2016-11-17 ENCOUNTER — Encounter: Payer: Self-pay | Admitting: Neurology

## 2016-11-20 ENCOUNTER — Other Ambulatory Visit: Payer: Self-pay | Admitting: Cardiovascular Disease

## 2016-11-20 DIAGNOSIS — E785 Hyperlipidemia, unspecified: Secondary | ICD-10-CM

## 2016-11-20 DIAGNOSIS — I10 Essential (primary) hypertension: Secondary | ICD-10-CM

## 2016-11-20 DIAGNOSIS — I2510 Atherosclerotic heart disease of native coronary artery without angina pectoris: Secondary | ICD-10-CM

## 2016-11-26 ENCOUNTER — Ambulatory Visit: Payer: Managed Care, Other (non HMO) | Attending: Neurology | Admitting: Occupational Therapy

## 2016-11-26 ENCOUNTER — Ambulatory Visit (INDEPENDENT_AMBULATORY_CARE_PROVIDER_SITE_OTHER): Payer: Managed Care, Other (non HMO) | Admitting: Psychology

## 2016-11-26 DIAGNOSIS — F4323 Adjustment disorder with mixed anxiety and depressed mood: Secondary | ICD-10-CM

## 2016-11-26 DIAGNOSIS — R2689 Other abnormalities of gait and mobility: Secondary | ICD-10-CM | POA: Diagnosis present

## 2016-11-26 DIAGNOSIS — R278 Other lack of coordination: Secondary | ICD-10-CM | POA: Insufficient documentation

## 2016-11-26 DIAGNOSIS — R29818 Other symptoms and signs involving the nervous system: Secondary | ICD-10-CM | POA: Insufficient documentation

## 2016-11-26 NOTE — Therapy (Signed)
[facility] [address] [place], [place], [address] Phone: [phone]   Fax:  [phone]  Occupational Therapy Evaluation  Patient Details  Name: Keith Morrow MRN: [id number] Date of Birth: [birth year] Referring Provider: Dr. Despina Arias  Encounter Date: 11/26/2016      OT End of Session - 11/26/16 1248    Visit Number 1   Number of Visits 9   Authorization Type [employer]    OT Start Time 1100   OT Stop Time 1150   OT Time Calculation (min) 50 min   Activity Tolerance Patient tolerated treatment well      Past Medical History:  Diagnosis Date  . Acute bronchitis 05/09/2013  . Allergy   . Bronchitis, acute 12/10/2011  . Chicken pox as a child  . Coronary atherosclerosis of native coronary artery    a. NSTEMI 04/20/11: tx with Promus DES to LAD; bifurcation lesion with oDx 90% tx with POBA; b. staged PCI of right post. AV Branch with Promus DES; c. residual at cath 04/21/11:  AV groove CFX 50%, mRCA 30%,; EF 55%  . Depression   . Depression with anxiety   . Dermatitis, contact 12/10/2011  . Fatigue 10/15/2012  . Heart attack 04-20-11  . HTN (hypertension)   . Hyperlipidemia   . Hypotestosteronism 11/17/2011  . Hypothyroidism   . Keloid scar   . MS (multiple sclerosis) (HCC)   . Neuromuscular disorder (HCC)    NUMBNESS/TINGLING  . Obesity   . Onychomycosis   . Preventative health care 10/07/2011  . Reflux 10/07/2011  . Sun-damaged skin 02/17/2016  . Testosterone deficiency 01/16/2012  . Tinea pedis   . 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 Visual changes 11/13/2016    Past Surgical History:  Procedure Laterality Date  . CORONARY ANGIOPLASTY WITH STENT PLACEMENT     2012  . PAIN PUMP IMPLANTATION N/A 08/29/2014   Procedure: Baclofen pump placement;  Surgeon: Maeola Harman, MD;  Location: [facility];  Service: Neurosurgery;  Laterality: N/A;  Baclofen pump placement  . PROGRAMABLE BACLOFEN PUMP  REVISION  08/29/14  . right wrist surgery     CTR  . VASCULAR SURGERY     vericose vein in right femoral area  . VASECTOMY    . WISDOM TOOTH EXTRACTION      There were no vitals filed for this visit.      Subjective Assessment - 11/26/16 1109    Pertinent History MS 45 (+) years, MI 2012   Patient Stated Goals To get my Rt hand better   Currently in Pain? No/denies           [facility] OT Assessment - 11/26/16 0001      Assessment   Diagnosis MS  (relapsing-remitting)    Referring Provider Dr. 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 currently, previous outpatient O.T. and P.T.      Precautions   Precautions Fall     Restrictions   Weight Bearing Restrictions No     Balance Screen   Has the patient fallen in the past 6 months Yes   How many times? --  Multiple times   Has the patient had a decrease in activity level because of a fear of falling?  Yes   Is the patient reluctant to leave their home because of a fear of falling?  No     Home  Environment   Bathroom Shower/Tub Walk-in Pharmacologist   Additional Comments Pt lives on 1st floor of 2 story home. Ramp to enter. 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     ADL   Eating/Feeding Modified independent   Grooming Modified independent   Upper Body Bathing Modified independent   Lower Body Bathing Modified independent   Upper Body Dressing Increased time  Mod I except cuff and collar buttons   Lower Body Dressing Modified independent  with compression donner aide   Toilet Tranfer Modified independent  difficulty    Toileting - Clothing Manipulation Modified independent   Toileting -  Engineer, mining Modified independent     IADL   Shopping Shops independently for small purchases   Light Housekeeping Does personal laundry completely   Meal Prep Plans, prepares and serves adequate meals independently  stool for sitting   Community Mobility Drives own vehicle  adapted vehicle   Medication Management Is responsible for taking medication in correct dosages at correct time     Mobility   Mobility Status Needs assist  rollator     Written Expression   Dominant Hand Right   Handwriting 100% legible  for 1-2 sentences, built up pen, in print     Vision - History   Baseline Vision Wears glasses for distance only   but difficulty and reports needs to see optometrist   Additional Comments Pt reports bluriness in central vision of Rt eye, denies diplopia     Activity Tolerance   Activity Tolerance Tolerates < 10 min activity with changes in vital signs     Cognition   Overall Cognitive Status Cognition to be further assessed in functional context PRN     Observation/Other Assessments   Observations denies changes in cognition     Sensation   Light Touch Appears Intact   Additional Comments Pt reports diminshed RUE.      Coordination   9 Hole Peg Test Right;Left   Right 9 Hole Peg Test 52.13 sec   Left 9 Hole Peg Test 34.66 sec     Edema   Edema NONE     ROM / Strength   AROM / PROM / Strength AROM     AROM   Overall AROM Comments BUE AROM WNL's      Hand Function   Right Hand Grip (lbs) 118 lbs   Left Hand Grip (lbs) 115 lbs                              OT Long Term Goals - 11/26/16 1253      OT LONG TERM GOAL #1   Title Independent with coordination HEP for Rt hand   Time 4   Period Weeks   Status New     OT LONG TERM GOAL #2   Title Pt to verbalize understanding with A/E, DME, and kitchen adaptations for greater  ease and safety with ADLS   Time 4   Period Weeks   Status New     OT LONG TERM GOAL #3   Title Pt to  verbalize understanding with energy conservation techniques   Time 4   Period Weeks   Status New     OT LONG TERM GOAL #4   Title Pt to improve coordination Rt hand as evidenced by reducing speed on 9 hole peg test to 45 sec. or less   Baseline 52.13 sec   Time 4   Period Weeks   Status New     OT LONG TERM GOAL #5   Title Pt able to don/doff coat in standing with greater ease and without LOB   Time 4   Period Weeks   Status New     Long Term Additional Goals   Additional Long Term Goals Yes     OT LONG TERM GOAL #6   Title Typing goal TBD prn   Time 4   Period Weeks   Status New               Plan - 11/26/16 1248    Clinical Impression Statement Pt is a 45 y.o. male who presents to outpatient rehab with MS (20 years) with gradual decline in coordination/dexterity in Rt dominant hand (no recent exacerbations). Pt also high fall risk and relies on rollator for ambulation. Pt also reports overall decr. endurance which limits ease, safety, and efficiency with ADLS and work related tasks.    Rehab Potential Fair   OT Frequency 2x / week   OT Duration 4 weeks  plus evaluation   OT Treatment/Interventions Self-care/ADL training;DME and/or AE instruction;Patient/family education;Therapeutic exercises;Therapeutic activities;Functional Mobility Training;Neuromuscular education;Passive range of motion;Cognitive remediation/compensation;Energy conservation;Manual Therapy;Visual/perceptual remediation/compensation   Plan coordination HEP, assess typing and write goal prn, transfer to [facility] outpatient O.T. secondary to closer to where pt works/more convenient   Consulted and Agree with Plan of Care Patient      Patient will benefit from skilled therapeutic intervention in order to improve the following deficits and impairments:  Decreased coordination, Difficulty  walking, Decreased endurance, Decreased safety awareness, Impaired sensation, Decreased knowledge of precautions, Decreased activity tolerance, Decreased knowledge of use of DME, Impaired UE functional use, Decreased mobility, Decreased cognition, Impaired vision/preception  Visit Diagnosis: Other lack of coordination - Plan: Ot plan of care cert/re-cert  Other abnormalities of gait and mobility - Plan: Ot plan of care cert/re-cert  Other symptoms and signs involving the nervous system - Plan: Ot plan of care cert/re-cert    Problem List Patient Active Problem List   Diagnosis Date Noted  . Visual changes 11/13/2016  . Right hand weakness 10/17/2016  . Dysesthesia 06/02/2016  . Erectile dysfunction 02/17/2016  . Sun-damaged skin 02/17/2016  . Gait disturbance 12/24/2015  . Spasticity 12/24/2015  . Urinary urgency 12/24/2015  . Insomnia 12/24/2015  . Shortness of breath on exertion 10/10/2015  . Leg edema, right 05/15/2015  . Pedal edema 04/28/2015  . Dermatitis 03/06/2014  . Fatigue 10/15/2012  . Hypotestosteronism 11/17/2011  . Preventative health care 10/07/2011  . GERD (gastroesophageal reflux disease) 10/07/2011  . Obesity   . Depression with anxiety   . Onychomycosis   . Tinea pedis   . Keloid scar   . Coronary atherosclerosis of native coronary artery   . MS (multiple sclerosis) (HCC)   . Hyperlipidemia   . Hypothyroidism   . HTN (hypertension)     Kelli ChurnBallie, Deryn Massengale Johnson, OTR/L 11/26/2016, 1:00 PM  [facility] [address] [place], [place], [address] Phone: [phone]   Fax:  [phone]  Name: Teena IraniDavid M Memorial HospitalVirost  MRN: [id number] Date of Birth: 1971-03-10

## 2016-12-08 ENCOUNTER — Encounter: Payer: Self-pay | Admitting: Family Medicine

## 2016-12-10 ENCOUNTER — Ambulatory Visit: Payer: Managed Care, Other (non HMO) | Attending: Neurology | Admitting: Occupational Therapy

## 2016-12-10 DIAGNOSIS — R278 Other lack of coordination: Secondary | ICD-10-CM | POA: Insufficient documentation

## 2016-12-10 DIAGNOSIS — R29818 Other symptoms and signs involving the nervous system: Secondary | ICD-10-CM | POA: Diagnosis present

## 2016-12-10 DIAGNOSIS — R2689 Other abnormalities of gait and mobility: Secondary | ICD-10-CM | POA: Diagnosis present

## 2016-12-10 NOTE — Therapy (Signed)
[facility] [place] [address]  [address] [place], [place], [address] Phone: [phone]   Fax:  [phone]  Occupational Therapy Treatment  Patient Details  Name: Keith Morrow MRN: [id number] Date of Birth: 09/15/1971 Referring Provider: Dr. Despina Arias  Encounter Date: 12/10/2016      OT End of Session - 12/10/16 0905    Visit Number 2   Number of Visits 9   Authorization Type [employer]    OT Start Time 0800   OT Stop Time 0900   OT Time Calculation (min) 60 min   Activity Tolerance Patient tolerated treatment well   Behavior During Therapy [facility] for tasks assessed/performed      Past Medical History:  Diagnosis Date  . Acute bronchitis 05/09/2013  . Allergy   . Bronchitis, acute 12/10/2011  . Chicken pox as a child  . Coronary atherosclerosis of native coronary artery    a. NSTEMI 04/20/11: tx with Promus DES to LAD; bifurcation lesion with oDx 90% tx with POBA; b. staged PCI of right post. AV Branch with Promus DES; c. residual at cath 04/21/11:  AV groove CFX 50%, mRCA 30%,; EF 55%  . Depression   . Depression with anxiety   . Dermatitis, contact 12/10/2011  . Fatigue 10/15/2012  . Heart attack 04-20-11  . HTN (hypertension)   . Hyperlipidemia   . Hypotestosteronism 11/17/2011  . Hypothyroidism   . Keloid scar   . MS (multiple sclerosis) (HCC)   . Neuromuscular disorder (HCC)    NUMBNESS/TINGLING  . Obesity   . Onychomycosis   . Preventative health care 10/07/2011  . Reflux 10/07/2011  . Sun-damaged skin 02/17/2016  . Testosterone deficiency 01/16/2012  . Tinea pedis   . 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 Visual changes 11/13/2016    Past Surgical History:  Procedure Laterality Date  . CORONARY ANGIOPLASTY WITH STENT PLACEMENT     2012  . PAIN PUMP IMPLANTATION N/A 08/29/2014   Procedure: Baclofen pump placement;  Surgeon: Maeola Harman, MD;  Location: [facility];  Service: Neurosurgery;  Laterality: N/A;   Baclofen pump placement  . PROGRAMABLE BACLOFEN PUMP REVISION  08/29/14  . right wrist surgery     CTR  . VASCULAR SURGERY     vericose vein in right femoral area  . VASECTOMY    . WISDOM TOOTH EXTRACTION      There were no vitals filed for this visit.      Subjective Assessment - 12/10/16 0801    Subjective  I've been having a little difficulty tying shoes   Pertinent History MS 20 (+) years, MI 2012   Patient Stated Goals To get my Rt hand better   Currently in Pain? No/denies            [facility] OT Assessment - 12/10/16 0001      Coordination   Other Typing test: 26 wpm, 86% accuracy with most errors on Rt side (especially typing "m" instead of "n")                   OT Treatments/Exercises (OP) - 12/10/16 0001      ADLs   UB Dressing Practiced donning/doffing jacket in standing various ways with LOB. Recommneded pt have sturdy furniture or countertop to lean against in standing to don/doff jacket or do seated.    LB Dressing Discussed options for tying shoes including: crossing legs, use of stool, and/or velcro shoes  Fine Motor Coordination   Other Fine Motor Exercises Pt issued coordination HEP including Rt handed typing words, and isolated finger extension ex's in prep for typing. Pt also issued intrinsic hand ex's including: finger abd/add and MP flexion  (see pt instructions). Pt return demo of all ex's x 5 - 10 reps with initial cueing to perform correctly                OT Education - 12/10/16 0842    Education provided Yes   Education Details Coordination HEP, pre-typing ex's, Rt handed typing words, intrinsic hand ex's   Person(s) Educated Patient   Methods Explanation;Demonstration;Handout   Comprehension Verbalized understanding;Returned demonstration;Verbal cues required             OT Long Term Goals - 12/10/16 0906      OT LONG TERM GOAL #1   Title Independent with coordination HEP for Rt hand   Time 4   Period Weeks    Status On-going     OT LONG TERM GOAL #2   Title Pt to verbalize understanding with A/E, DME, and kitchen adaptations for greater ease and safety with ADLS   Time 4   Period Weeks   Status New     OT LONG TERM GOAL #3   Title Pt to verbalize understanding with energy conservation techniques   Time 4   Period Weeks   Status New     OT LONG TERM GOAL #4   Title Pt to improve coordination Rt hand as evidenced by reducing speed on 9 hole peg test to 45 sec. or less   Baseline 52.13 sec   Time 4   Period Weeks   Status New     OT LONG TERM GOAL #5   Title Pt able to don/doff coat in standing with greater ease and without LOB   Time 4   Period Weeks   Status On-going     OT LONG TERM GOAL #6   Title Typing goal TBD prn   Time 4   Period Weeks   Status New               Plan - 12/10/16 1478    Clinical Impression Statement Pt progressing towards LTG #1. Pt with greater understanding of activities to maintain coordination (especially Rt hand).    OT Frequency 2x / week   OT Duration 4 weeks   OT Treatment/Interventions Self-care/ADL training;DME and/or AE instruction;Patient/family education;Therapeutic exercises;Therapeutic activities;Functional Mobility Training;Neuromuscular education;Passive range of motion;Cognitive remediation/compensation;Energy conservation;Manual Therapy;Visual/perceptual remediation/compensation   Plan review HEP, issue/review energy conservation techniques      Patient will benefit from skilled therapeutic intervention in order to improve the following deficits and impairments:  Decreased coordination, Difficulty walking, Decreased endurance, Decreased safety awareness, Impaired sensation, Decreased knowledge of precautions, Decreased activity tolerance, Decreased knowledge of use of DME, Impaired UE functional use, Decreased mobility, Decreased cognition, Impaired vision/preception  Visit Diagnosis: Other lack of coordination  Other  abnormalities of gait and mobility    Problem List Patient Active Problem List   Diagnosis Date Noted  . Visual changes 11/13/2016  . Right hand weakness 10/17/2016  . Dysesthesia 06/02/2016  . Erectile dysfunction 02/17/2016  . Sun-damaged skin 02/17/2016  . Gait disturbance 12/24/2015  . Spasticity 12/24/2015  . Urinary urgency 12/24/2015  . Insomnia 12/24/2015  . Shortness of breath on exertion 10/10/2015  . Leg edema, right 05/15/2015  . Pedal edema 04/28/2015  . Dermatitis 03/06/2014  . Fatigue 10/15/2012  .  Hypotestosteronism 11/17/2011  . Preventative health care 10/07/2011  . GERD (gastroesophageal reflux disease) 10/07/2011  . Obesity   . Depression with anxiety   . Onychomycosis   . Tinea pedis   . Keloid scar   . Coronary atherosclerosis of native coronary artery   . MS (multiple sclerosis) (HCC)   . Hyperlipidemia   . Hypothyroidism   . HTN (hypertension)     Kelli Churn, OTR/L 12/10/2016, 9:10 AM  [facility] [address]  [address] [place], [place], [address] Phone: [phone]   Fax:  [phone]  Name: ASHUTOSH DIEGUEZ MRN: [id number] Date of Birth: Oct 18, 1971

## 2016-12-10 NOTE — Patient Instructions (Signed)
  Coordination Activities  Perform the following activities for 10-15 minutes 1-2 times per day with both hand(s).   Rotate ball in fingertips (clockwise and counter-clockwise).  Flip cards 1 at a time as fast as you can.  Deal cards with your thumb (Hold deck in hand and push card off top with thumb).  Rotate card in hand (clockwise and counter-clockwise).  Shuffle cards.  Pick up coins, buttons, marbles, dried beans/pasta of different sizes and place in container.  Pick up coins one at a time until you get 5-10 in your hand, then move coins from palm to fingertips to stack one at a time.  Twirl pen between fingers.  Practice typing Rt handed words (provided).  Abduction / Adduction (Active)    With hand flat on table, spread all fingers apart, then bring them together as close as possible. Repeat _10___ times. Do __2__ sessions per day. Do NOT "cheat" with wrist   MP Flexion (Active)    With back of hand on table, bend large knuckles as far as they will go, keeping small joints straight. Repeat __10__ times. Do __2__ sessions per day. Activity: Reach into a narrow container.*

## 2016-12-11 ENCOUNTER — Telehealth: Payer: Self-pay | Admitting: *Deleted

## 2016-12-11 NOTE — Telephone Encounter (Signed)
[employer] PA approved by Rosann Auerbach [id number]) - effective dates 12/12/16 through 12/12/17 - Ref Code:[id number] - pt JW#[id number] 01.

## 2016-12-15 ENCOUNTER — Ambulatory Visit: Payer: Managed Care, Other (non HMO) | Admitting: Occupational Therapy

## 2016-12-16 ENCOUNTER — Other Ambulatory Visit: Payer: Self-pay | Admitting: Family Medicine

## 2016-12-17 ENCOUNTER — Ambulatory Visit: Payer: Managed Care, Other (non HMO) | Admitting: Occupational Therapy

## 2016-12-17 DIAGNOSIS — R278 Other lack of coordination: Secondary | ICD-10-CM

## 2016-12-17 DIAGNOSIS — R2689 Other abnormalities of gait and mobility: Secondary | ICD-10-CM

## 2016-12-17 DIAGNOSIS — R29818 Other symptoms and signs involving the nervous system: Secondary | ICD-10-CM

## 2016-12-17 NOTE — Patient Instructions (Signed)

## 2016-12-17 NOTE — Therapy (Signed)
[name] [address]  [address] [place], [place], [address] Phone: [phone]   Fax:  [phone]  Occupational Therapy Treatment  Patient Details  Name: Keith Morrow MRN: [id number] Date of Birth: January 21, 1971 Referring Provider: Dr. Despina Arias  Encounter Date: 12/17/2016      OT End of Session - 12/17/16 0902    Visit Number 3   Number of Visits 9   Authorization Type [employer]    OT Start Time 0800   OT Stop Time 0855   OT Time Calculation (min) 55 min   Activity Tolerance Patient tolerated treatment well      Past Medical History:  Diagnosis Date  . Acute bronchitis 05/09/2013  . Allergy   . Bronchitis, acute 12/10/2011  . Chicken pox as a child  . Coronary atherosclerosis of native coronary artery    a. NSTEMI 04/20/11: tx with Promus DES to LAD; bifurcation lesion with oDx 90% tx with POBA; b. staged PCI of right post. AV Branch with Promus DES; c. residual at cath 04/21/11:  AV groove CFX 50%, mRCA 30%,; EF 55%  . Depression   . Depression with anxiety   . Dermatitis, contact 12/10/2011  . Fatigue 10/15/2012  . Heart attack 04-20-11  . HTN (hypertension)   . Hyperlipidemia   . Hypotestosteronism 11/17/2011  . Hypothyroidism   . Keloid scar   . MS (multiple sclerosis) (HCC)   . Neuromuscular disorder (HCC)    NUMBNESS/TINGLING  . Obesity   . Onychomycosis   . Preventative health care 10/07/2011  . Reflux 10/07/2011  . Sun-damaged skin 02/17/2016  . Testosterone deficiency 01/16/2012  . Tinea pedis   . 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 Visual changes 11/13/2016    Past Surgical History:  Procedure Laterality Date  . CORONARY ANGIOPLASTY WITH STENT PLACEMENT     2012  . PAIN PUMP IMPLANTATION N/A 08/29/2014   Procedure: Baclofen pump placement;  Surgeon: Maeola Harman, MD;  Location: [facility];  Service: Neurosurgery;  Laterality: N/A;  Baclofen pump placement  . PROGRAMABLE BACLOFEN PUMP  REVISION  08/29/14  . right wrist surgery     CTR  . VASCULAR SURGERY     vericose vein in right femoral area  . VASECTOMY    . WISDOM TOOTH EXTRACTION      There were no vitals filed for this visit.      Subjective Assessment - 12/17/16 0759    Pertinent History MS 20 (+) years, MI 2012   Patient Stated Goals To get my Rt hand better   Currently in Pain? No/denies                      OT Treatments/Exercises (OP) - 12/17/16 0001      ADLs   UB Dressing Reviewed safety with donning/doffing jacket in standing - recommended leaning against wall or stable surface, or perform task seated d/t LOB   LB Dressing Verbally discussed donning/doffing pants over hips for toileting if pt is having difficulty standing, and alterantives for standing   ADL Comments Pt issued energy conservation techniques and reviewed. Also discussed possible A/E recommendations including: compression hose donner, mirror above stove and pot holder for safety during meal prep. Discussed use of reacher and once acquired, attaching to rollator     Fine Motor Coordination   Other Fine Motor Exercises Reviewed previously issued HEP - Pt demo each coordination ex  OT Education - 12/17/16 0857    Education provided Yes   Education Details Energy conservation techniques, A/E recommendations   Person(s) Educated Patient   Methods Explanation;Handout   Comprehension Verbalized understanding             OT Long Term Goals - 12/17/16 0903      OT LONG TERM GOAL #1   Title Independent with coordination HEP for Rt hand   Time 4   Period Weeks   Status Achieved     OT LONG TERM GOAL #2   Title Pt to verbalize understanding with A/E, DME, and kitchen adaptations for greater ease and safety with ADLS   Time 4   Period Weeks   Status On-going     OT LONG TERM GOAL #3   Title Pt to verbalize understanding with energy conservation techniques   Time 4   Period Weeks    Status On-going     OT LONG TERM GOAL #4   Title Pt to improve coordination Rt hand as evidenced by reducing speed on 9 hole peg test to 45 sec. or less   Baseline 52.13 sec   Time 4   Period Weeks   Status New     OT LONG TERM GOAL #5   Title Pt able to don/doff coat in standing with greater ease and without LOB   Time 4   Period Weeks   Status On-going     OT LONG TERM GOAL #6   Title Typing 28 wpm with 90% accuracy   Baseline 26 wpm, 86% accuracy   Time 4   Period Weeks   Status New               Plan - 12/17/16 1610    Clinical Impression Statement Pt progressing towards goals. Pt responding well to HEP and pt has better understanding of A/E needs.    Rehab Potential Fair   OT Frequency 2x / week   OT Duration 4 weeks   OT Treatment/Interventions Self-care/ADL training;DME and/or AE instruction;Patient/family education;Therapeutic exercises;Therapeutic activities;Functional Mobility Training;Neuromuscular education;Passive range of motion;Cognitive remediation/compensation;Energy conservation;Manual Therapy;Visual/perceptual remediation/compensation   Plan continue to address A/E and/or DME needs prn, typing games      Patient will benefit from skilled therapeutic intervention in order to improve the following deficits and impairments:  Decreased coordination, Difficulty walking, Decreased endurance, Decreased safety awareness, Impaired sensation, Decreased knowledge of precautions, Decreased activity tolerance, Decreased knowledge of use of DME, Impaired UE functional use, Decreased mobility, Decreased cognition, Impaired vision/preception  Visit Diagnosis: Other lack of coordination  Other abnormalities of gait and mobility  Other symptoms and signs involving the nervous system    Problem List Patient Active Problem List   Diagnosis Date Noted  . Visual changes 11/13/2016  . Right hand weakness 10/17/2016  . Dysesthesia 06/02/2016  . Erectile  dysfunction 02/17/2016  . Sun-damaged skin 02/17/2016  . Gait disturbance 12/24/2015  . Spasticity 12/24/2015  . Urinary urgency 12/24/2015  . Insomnia 12/24/2015  . Shortness of breath on exertion 10/10/2015  . Leg edema, right 05/15/2015  . Pedal edema 04/28/2015  . Dermatitis 03/06/2014  . Fatigue 10/15/2012  . Hypotestosteronism 11/17/2011  . Preventative health care 10/07/2011  . GERD (gastroesophageal reflux disease) 10/07/2011  . Obesity   . Depression with anxiety   . Onychomycosis   . Tinea pedis   . Keloid scar   . Coronary atherosclerosis of native coronary artery   . MS (multiple sclerosis) (HCC)   .  Hyperlipidemia   . Hypothyroidism   . HTN (hypertension)     Kelli Churn, OTR/L 12/17/2016, 9:07 AM  [facility] [address]  [address] [place], [place], [address] Phone: [phone]   Fax:  [phone]  Name: Keith Morrow MRN: [id number] Date of Birth: 1970-12-25

## 2016-12-19 ENCOUNTER — Ambulatory Visit (INDEPENDENT_AMBULATORY_CARE_PROVIDER_SITE_OTHER): Payer: Managed Care, Other (non HMO) | Admitting: Psychology

## 2016-12-19 DIAGNOSIS — F4323 Adjustment disorder with mixed anxiety and depressed mood: Secondary | ICD-10-CM

## 2016-12-22 ENCOUNTER — Encounter: Payer: Self-pay | Admitting: Occupational Therapy

## 2016-12-24 ENCOUNTER — Encounter: Payer: Self-pay | Admitting: Occupational Therapy

## 2016-12-29 ENCOUNTER — Encounter: Payer: Self-pay | Admitting: Occupational Therapy

## 2016-12-31 ENCOUNTER — Ambulatory Visit: Payer: Managed Care, Other (non HMO) | Admitting: Occupational Therapy

## 2016-12-31 DIAGNOSIS — R278 Other lack of coordination: Secondary | ICD-10-CM | POA: Diagnosis not present

## 2016-12-31 DIAGNOSIS — R2689 Other abnormalities of gait and mobility: Secondary | ICD-10-CM

## 2016-12-31 NOTE — Therapy (Signed)
[facility] [address]  [place], [place], [address] Phone: [phone]   Fax:  [phone]  Occupational Therapy Treatment  Patient Details  Name: NICOLAUS ANDEL MRN: [id number] Date of Birth: 10/03/1971 Referring Provider: Dr. Arlice Colt  Encounter Date: 12/31/2016      OT End of Session - 12/31/16 0852    Visit Number 4   Number of Visits 9   Authorization Type [employer]    OT Start Time 0800   OT Stop Time 0845   OT Time Calculation (min) 45 min   Activity Tolerance Patient tolerated treatment well      Past Medical History:  Diagnosis Date  . Acute bronchitis 05/09/2013  . Allergy   . Bronchitis, acute 12/10/2011  . Chicken pox as a child  . Coronary atherosclerosis of native coronary artery    a. NSTEMI 04/20/11: tx with Promus DES to LAD; bifurcation lesion with oDx 90% tx with POBA; b. staged PCI of right post. AV Branch with Promus DES; c. residual at cath 04/21/11:  AV groove CFX 50%, mRCA 30%,; EF 55%  . Depression   . Depression with anxiety   . Dermatitis, contact 12/10/2011  . Fatigue 10/15/2012  . Heart attack 04-20-11  . HTN (hypertension)   . Hyperlipidemia   . Hypotestosteronism 11/17/2011  . Hypothyroidism   . Keloid scar   . MS (multiple sclerosis) ([place])   . Neuromuscular disorder (HCC)    NUMBNESS/TINGLING  . Obesity   . Onychomycosis   . Preventative health care 10/07/2011  . Reflux 10/07/2011  . Sun-damaged skin 02/17/2016  . Testosterone deficiency 01/16/2012  . Tinea pedis   . 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 Visual changes 11/13/2016    Past Surgical History:  Procedure Laterality Date  . CORONARY ANGIOPLASTY WITH STENT PLACEMENT     2012  . PAIN PUMP IMPLANTATION N/A 08/29/2014   Procedure: Baclofen pump placement;  Surgeon: Erline Levine, MD;  Location: [facility];  Service: Neurosurgery;  Laterality: N/A;  Baclofen pump placement  . PROGRAMABLE BACLOFEN PUMP  REVISION  08/29/14  . right wrist surgery     CTR  . VASCULAR SURGERY     vericose vein in right femoral area  . VASECTOMY    . WISDOM TOOTH EXTRACTION      There were no vitals filed for this visit.      Subjective Assessment - 12/31/16 0759    Subjective  I need help getting in/out of bed in hotels when I'm traveling   Pertinent History MS 20 (+) years, MI 2012   Patient Stated Goals To get my Rt hand better   Currently in Pain? No/denies                      OT Treatments/Exercises (OP) - 12/31/16 0001      ADLs   ADL Comments Discussed further A/E and DME needs including: leg lifter and traveling hand rail for bed mobility. Also discussed bathroom set up and recommended grab bars. Pt reports his shower is fibroglass and won't be able to have grab bars but pt is considering remodeling shower. Also discussed BSC and/or urinal for night time to prevent falls.      Fine Motor Coordination   Other Fine Motor Exercises Typing games on computer including: clouds and bubbles for coordination  OT Long Term Goals - 12/31/16 1610      OT LONG TERM GOAL #1   Title Independent with coordination HEP for Rt hand   Time 4   Period Weeks   Status Achieved     OT LONG TERM GOAL #2   Title Pt to verbalize understanding with A/E, DME, and kitchen adaptations for greater ease and safety with ADLS   Time 4   Period Weeks   Status Achieved     OT LONG TERM GOAL #3   Title Pt to verbalize understanding with energy conservation techniques   Time 4   Period Weeks   Status Achieved     OT LONG TERM GOAL #4   Title Pt to improve coordination Rt hand as evidenced by reducing speed on 9 hole peg test to 45 sec. or less   Baseline 52.13 sec   Time 4   Period Weeks   Status On-going     OT LONG TERM GOAL #5   Title Pt able to don/doff coat in standing with greater ease and without LOB   Time 4   Period Weeks   Status Achieved  with  countertop support only or seated     OT LONG TERM GOAL #6   Title Typing 28 wpm with 90% accuracy   Baseline 26 wpm, 86% accuracy   Time 4   Period Weeks   Status On-going               Plan - 12/31/16 9604    Clinical Impression Statement Pt met LTG's #2, 3, and 5. Pt progressing with self care needs using A/E and DME prn.    Rehab Potential Fair   OT Frequency 2x / week   OT Duration 4 weeks   OT Treatment/Interventions Self-care/ADL training;DME and/or AE instruction;Patient/family education;Therapeutic exercises;Therapeutic activities;Functional Mobility Training;Neuromuscular education;Passive range of motion;Cognitive remediation/compensation;Energy conservation;Manual Therapy;Visual/perceptual remediation/compensation   Plan fine motor coordination, possible d/c next visit      Patient will benefit from skilled therapeutic intervention in order to improve the following deficits and impairments:  Decreased coordination, Difficulty walking, Decreased endurance, Decreased safety awareness, Impaired sensation, Decreased knowledge of precautions, Decreased activity tolerance, Decreased knowledge of use of DME, Impaired UE functional use, Decreased mobility, Decreased cognition, Impaired vision/preception  Visit Diagnosis: Other lack of coordination  Other abnormalities of gait and mobility    Problem List Patient Active Problem List   Diagnosis Date Noted  . Visual changes 11/13/2016  . Right hand weakness 10/17/2016  . Dysesthesia 06/02/2016  . Erectile dysfunction 02/17/2016  . Sun-damaged skin 02/17/2016  . Gait disturbance 12/24/2015  . Spasticity 12/24/2015  . Urinary urgency 12/24/2015  . Insomnia 12/24/2015  . Shortness of breath on exertion 10/10/2015  . Leg edema, right 05/15/2015  . Pedal edema 04/28/2015  . Dermatitis 03/06/2014  . Fatigue 10/15/2012  . Hypotestosteronism 11/17/2011  . Preventative health care 10/07/2011  . GERD (gastroesophageal  reflux disease) 10/07/2011  . Obesity   . Depression with anxiety   . Onychomycosis   . Tinea pedis   . Keloid scar   . Coronary atherosclerosis of native coronary artery   . MS (multiple sclerosis) ([place])   . Hyperlipidemia   . Hypothyroidism   . HTN (hypertension)     Carey Bullocks, OTR/L 12/31/2016, 8:56 AM  [facility] [address]  [place], [place], [address] Phone: [phone]   Fax:  [phone]  Name:  NEVYN BOSSMAN MRN: [id number] Date of Birth: 12/18/1970

## 2017-01-05 ENCOUNTER — Encounter: Payer: Self-pay | Admitting: *Deleted

## 2017-01-07 ENCOUNTER — Ambulatory Visit: Payer: Managed Care, Other (non HMO) | Admitting: Occupational Therapy

## 2017-01-09 ENCOUNTER — Ambulatory Visit (INDEPENDENT_AMBULATORY_CARE_PROVIDER_SITE_OTHER): Payer: Managed Care, Other (non HMO) | Admitting: Psychology

## 2017-01-09 DIAGNOSIS — F4323 Adjustment disorder with mixed anxiety and depressed mood: Secondary | ICD-10-CM

## 2017-01-12 IMAGING — DX DG CHEST 2V
2 series · 2 of 2 positions shown · non-contrast
Comparison: 10/10/2015

CLINICAL DATA: Right-sided chest pain after walking.

EXAM:
CHEST  2 VIEW

[w chest pa]
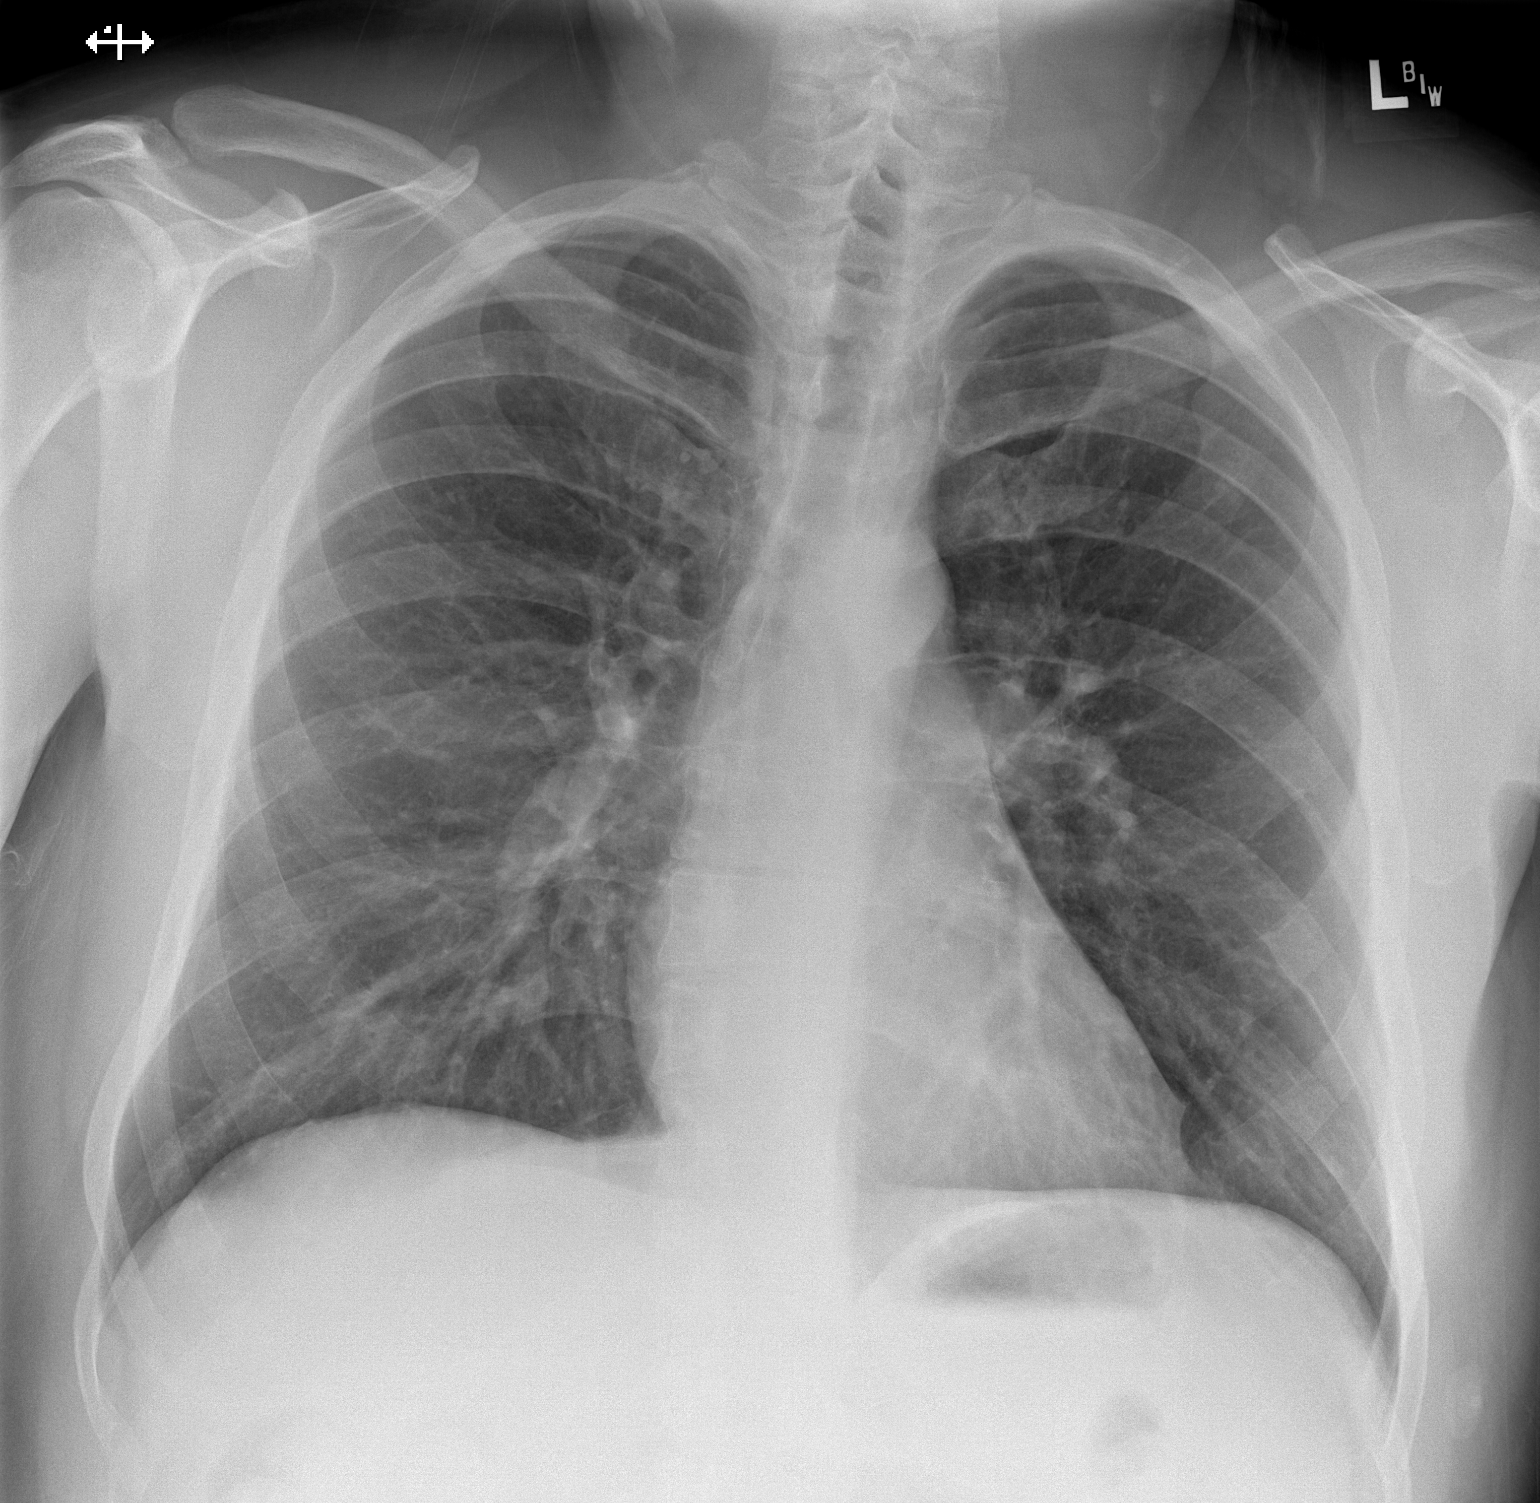

[w chest lat]
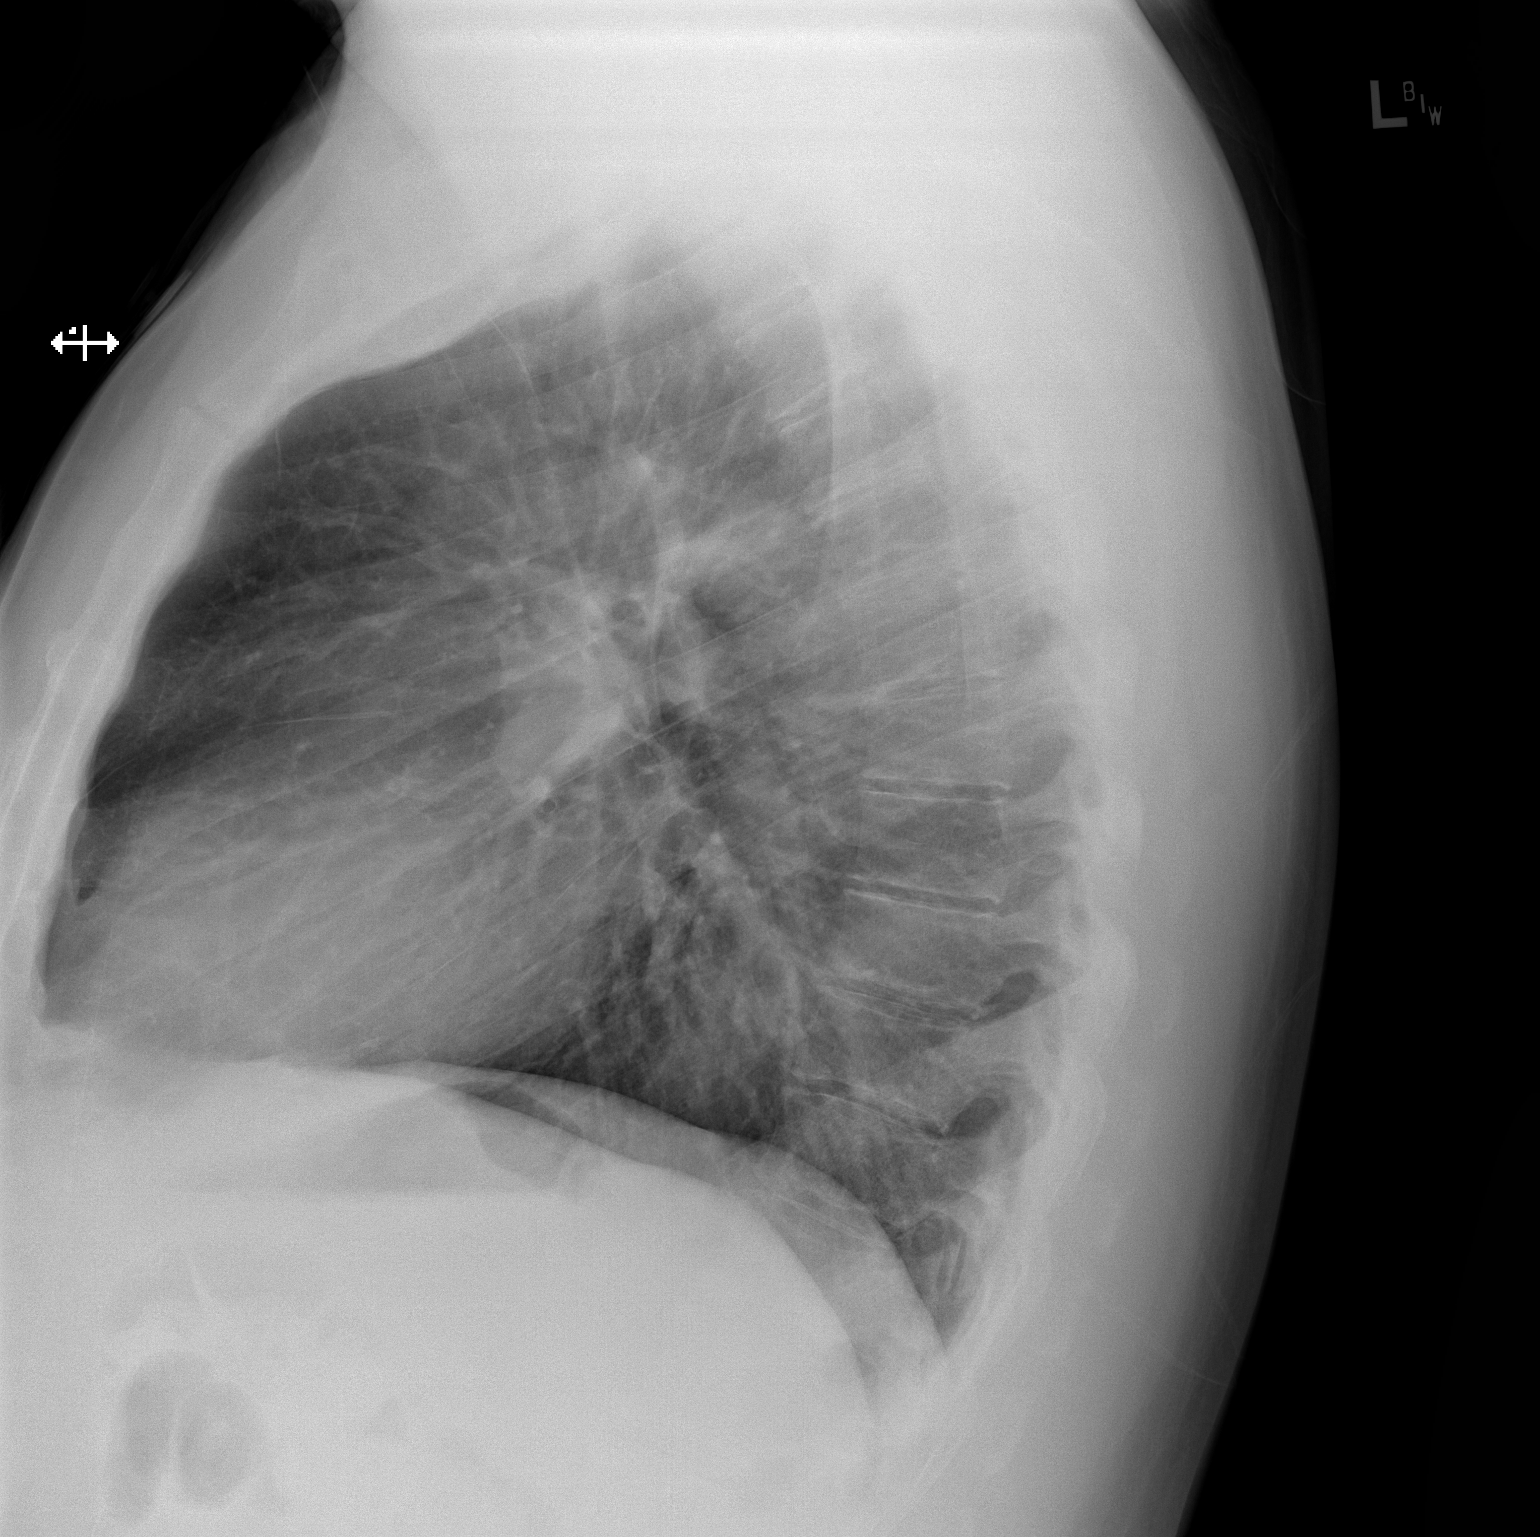

[2 of 2 positions shown; findings below may reference images not displayed]

FINDINGS: There is no focal parenchymal opacity. There is no pleural effusion
or pneumothorax. The heart and mediastinal contours are
unremarkable. The osseous structures are unremarkable.
IMPRESSION: No active cardiopulmonary disease.

## 2017-01-14 ENCOUNTER — Ambulatory Visit: Payer: Managed Care, Other (non HMO) | Attending: Neurology | Admitting: Occupational Therapy

## 2017-01-14 DIAGNOSIS — R2689 Other abnormalities of gait and mobility: Secondary | ICD-10-CM | POA: Diagnosis present

## 2017-01-14 DIAGNOSIS — R29818 Other symptoms and signs involving the nervous system: Secondary | ICD-10-CM

## 2017-01-14 DIAGNOSIS — R278 Other lack of coordination: Secondary | ICD-10-CM

## 2017-01-14 NOTE — Therapy (Signed)
[facility] [address]  [place], [place], [address] Phone: [phone]   Fax:  [phone]  Occupational Therapy Treatment  Patient Details  Name: Keith Morrow MRN: [id number] Date of Birth: [birth year] Referring Provider: Dr. Arlice Colt  Encounter Date: 01/14/2017      OT End of Session - 01/14/17 0920    Visit Number 5   Number of Visits 9   Authorization Type [employer]    OT Start Time 0800   OT Stop Time 0845   OT Time Calculation (min) 45 min   Activity Tolerance Patient tolerated treatment well      Past Medical History:  Diagnosis Date  . Acute bronchitis 05/09/2013  . Allergy   . Bronchitis, acute 12/10/2011  . Chicken pox as a child  . Coronary atherosclerosis of native coronary artery    a. NSTEMI 04/20/11: tx with Promus DES to LAD; bifurcation lesion with oDx 90% tx with POBA; b. staged PCI of right post. AV Branch with Promus DES; c. residual at cath 04/21/11:  AV groove CFX 50%, mRCA 30%,; EF 55%  . Depression   . Depression with anxiety   . Dermatitis, contact 12/10/2011  . Fatigue 10/15/2012  . Heart attack 04-20-11  . HTN (hypertension)   . Hyperlipidemia   . Hypotestosteronism 11/17/2011  . Hypothyroidism   . Keloid scar   . MS (multiple sclerosis) ([place])   . Neuromuscular disorder (HCC)    NUMBNESS/TINGLING  . Obesity   . Onychomycosis   . Preventative health care 10/07/2011  . Reflux 10/07/2011  . Sun-damaged skin 02/17/2016  . Testosterone deficiency 01/16/2012  . Tinea pedis   . 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 Visual changes 11/13/2016    Past Surgical History:  Procedure Laterality Date  . CORONARY ANGIOPLASTY WITH STENT PLACEMENT     2012  . PAIN PUMP IMPLANTATION N/A 08/29/2014   Procedure: Baclofen pump placement;  Surgeon: Erline Levine, MD;  Location: [facility];  Service: Neurosurgery;  Laterality: N/A;  Baclofen pump placement  . PROGRAMABLE BACLOFEN PUMP  REVISION  08/29/14  . right wrist surgery     CTR  . VASCULAR SURGERY     vericose vein in right femoral area  . VASECTOMY    . WISDOM TOOTH EXTRACTION      There were no vitals filed for this visit.      Subjective Assessment - 01/14/17 0758    Subjective  Thank you. This has been helpful information   Pertinent History MS 20 (+) years, MI 2012   Patient Stated Goals To get my Rt hand better   Currently in Pain? No/denies                      OT Treatments/Exercises (OP) - 01/14/17 0001      ADLs   ADL Comments Discussed bed mobility and leg management with A/E re: previous recommendations and adaptations. Discussed and provided information on [employer] and nearby locations for potential options for typing/work related tasks. Also discussed voice recognition software and accessibility options on computer. Assessed remaining STG's     Fine Motor Coordination   Fine Motor Coordination Small Pegboard   Small Pegboard Placing small pegs in pegboard Rt hand, then removing up to 5 at a time for in hand manipulation   Other Fine Motor Exercises Typing games on computer (bubbles) for coordination prior to assessing typing  goal.                 OT Education - 01/14/17 0919    Education provided Yes   Education Details Assistive Technology information   Person(s) Educated Patient   Methods Explanation;Handout   Comprehension Verbalized understanding             OT Long Term Goals - 01/14/17 0920      OT LONG TERM GOAL #1   Title Independent with coordination HEP for Rt hand   Time 4   Period Weeks   Status Achieved     OT LONG TERM GOAL #2   Title Pt to verbalize understanding with A/E, DME, and kitchen adaptations for greater ease and safety with ADLS   Time 4   Period Weeks   Status Achieved     OT LONG TERM GOAL #3   Title Pt to verbalize understanding with energy conservation techniques   Time 4   Period Weeks    Status Achieved     OT LONG TERM GOAL #4   Title Pt to improve coordination Rt hand as evidenced by reducing speed on 9 hole peg test to 45 sec. or less   Baseline 52.13 sec   Time 4   Period Weeks   Status Achieved  01/14/17: 41.56 sec     OT LONG TERM GOAL #5   Title Pt able to don/doff coat in standing with greater ease and without LOB   Time 4   Period Weeks   Status Achieved  with countertop support only or seated     OT LONG TERM GOAL #6   Title Typing 28 wpm with 90% accuracy   Baseline 26 wpm, 86% accuracy   Time 4   Period Weeks   Status Partially Met  01/14/17: 25 wpm, 94% accuracy               Plan - 01/14/17 6789    Clinical Impression Statement Pt met LTG #4 and partially met LTG #6. All other goals met.    OT Treatment/Interventions Self-care/ADL training;DME and/or AE instruction;Patient/family education;Therapeutic exercises;Therapeutic activities;Functional Mobility Training;Neuromuscular education;Passive range of motion;Cognitive remediation/compensation;Energy conservation;Manual Therapy;Visual/perceptual remediation/compensation   Plan D/C O.T.    Consulted and Agree with Plan of Care Patient      Patient will benefit from skilled therapeutic intervention in order to improve the following deficits and impairments:  Decreased coordination, Difficulty walking, Decreased endurance, Decreased safety awareness, Impaired sensation, Decreased knowledge of precautions, Decreased activity tolerance, Decreased knowledge of use of DME, Impaired UE functional use, Decreased mobility, Decreased cognition, Impaired vision/preception  Visit Diagnosis: Other lack of coordination  Other abnormalities of gait and mobility  Other symptoms and signs involving the nervous system    Problem List Patient Active Problem List   Diagnosis Date Noted  . Visual changes 11/13/2016  . Right hand weakness 10/17/2016  . Dysesthesia 06/02/2016  . Erectile dysfunction  02/17/2016  . Sun-damaged skin 02/17/2016  . Gait disturbance 12/24/2015  . Spasticity 12/24/2015  . Urinary urgency 12/24/2015  . Insomnia 12/24/2015  . Shortness of breath on exertion 10/10/2015  . Leg edema, right 05/15/2015  . Pedal edema 04/28/2015  . Dermatitis 03/06/2014  . Fatigue 10/15/2012  . Hypotestosteronism 11/17/2011  . Preventative health care 10/07/2011  . GERD (gastroesophageal reflux disease) 10/07/2011  . Obesity   . Depression with anxiety   . Onychomycosis   . Tinea pedis   . Keloid scar   . Coronary  atherosclerosis of native coronary artery   . MS (multiple sclerosis) ([place])   . Hyperlipidemia   . Hypothyroidism   . HTN (hypertension)     OCCUPATIONAL THERAPY DISCHARGE SUMMARY  Visits from Start of Care: 5  Current functional level related to goals / functional outcomes: SEE ABOVE   Remaining deficits: Same as initial evaluation   Education / Equipment: HEP's, A/E and DME recommendations, task modifications/adaptive strategies, safety, energy conservation techniques.  Plan: Patient agrees to discharge.  Patient goals were met. Patient is being discharged due to meeting the stated rehab goals.  ?????       Carey Bullocks, OTR/L 01/14/2017, 9:51 AM  [facility] [address]  [place], [place], [address] Phone: [phone]   Fax:  [phone]  Name: GARYSON STELLY MRN: [id number] Date of Birth: [birth year]

## 2017-01-15 ENCOUNTER — Encounter: Payer: Self-pay | Admitting: Family Medicine

## 2017-01-23 ENCOUNTER — Telehealth: Payer: Self-pay | Admitting: *Deleted

## 2017-01-23 NOTE — Telephone Encounter (Signed)
Received completed and signed Plan of Care from [name].  Faxed paperwork to [facility] @ [phone]).  Confirmation received.//AB/CMA

## 2017-01-30 ENCOUNTER — Ambulatory Visit (INDEPENDENT_AMBULATORY_CARE_PROVIDER_SITE_OTHER): Payer: Managed Care, Other (non HMO) | Admitting: Psychology

## 2017-01-30 DIAGNOSIS — F4323 Adjustment disorder with mixed anxiety and depressed mood: Secondary | ICD-10-CM

## 2017-02-02 ENCOUNTER — Other Ambulatory Visit: Payer: Self-pay | Admitting: Cardiovascular Disease

## 2017-02-02 DIAGNOSIS — I2510 Atherosclerotic heart disease of native coronary artery without angina pectoris: Secondary | ICD-10-CM

## 2017-02-02 DIAGNOSIS — I10 Essential (primary) hypertension: Secondary | ICD-10-CM

## 2017-02-02 DIAGNOSIS — E785 Hyperlipidemia, unspecified: Secondary | ICD-10-CM

## 2017-02-06 ENCOUNTER — Telehealth: Payer: Self-pay | Admitting: *Deleted

## 2017-02-06 MED ORDER — METHYLPHENIDATE HCL 10 MG PO TABS: ORAL_TABLET | ORAL | 0 refills | Status: DC

## 2017-02-06 NOTE — Telephone Encounter (Signed)
Ritalin r/f provided during infusion appt/fim

## 2017-02-13 ENCOUNTER — Ambulatory Visit (INDEPENDENT_AMBULATORY_CARE_PROVIDER_SITE_OTHER): Payer: Managed Care, Other (non HMO) | Admitting: Psychology

## 2017-02-13 DIAGNOSIS — F4323 Adjustment disorder with mixed anxiety and depressed mood: Secondary | ICD-10-CM | POA: Diagnosis not present

## 2017-02-15 NOTE — Progress Notes (Signed)
Cardiology Office Note Date:  02/16/2017   ID:  Keith Morrow, DOB [birth year], MRN [id number]  [name], MD  [name], MD    Chief Complaint  Patient presents with  . Follow-up    CAD     History of Present Illness: Keith Morrow is a 46 y.o. male who presents for  follow-up of coronary artery disease. The patient initially presented in 2012 with a non-ST elevation infarction and he was treated with stenting of the LAD and angioplasty of the first diagonal followed by staged PCI of the posterior AV segment of the right coronary artery. Other medical problems include multiple sclerosis, hypertension, hyperlipidemia, and hypothyroidism.  The patient is here alone today. He notes progressive weakness over the past year related to his multiple sclerosis. Chest pain symptoms occurred last year and a Myoview stress test was done, demonstrating no ischemia. His symptoms were ultimately felt to be neuropathic. He is now taking gabapentin. Symptoms have resolved. He has no dyspnea, orthopnea, or PND. He does have leg swelling at times and wears compression stockings intermittently.   Past Medical History:  Diagnosis Date  . Acute bronchitis 05/09/2013  . Allergy   . Bronchitis, acute 12/10/2011  . Chicken pox as a child  . Coronary atherosclerosis of native coronary artery    a. NSTEMI 04/20/11: tx with Promus DES to LAD; bifurcation lesion with oDx 90% tx with POBA; b. staged PCI of right post. AV Branch with Promus DES; c. residual at cath 04/21/11:  AV groove CFX 50%, mRCA 30%,; EF 55%  . Depression   . Depression with anxiety   . Dermatitis, contact 12/10/2011  . Fatigue 10/15/2012  . Heart attack 04-20-11  . HTN (hypertension)   . Hyperlipidemia   . Hypotestosteronism 11/17/2011  . Hypothyroidism   . Keloid scar   . MS (multiple sclerosis) (H[facility])   . Neuromuscular disorder (HCC)    NUMBNESS/TINGLING  . Obesity   . Onychomycosis   . Preventative health  care 10/07/2011  . Reflux 10/07/2011  . Sun-damaged skin 02/17/2016  . Testosterone deficiency 01/16/2012  . Tinea pedis   . 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 Visual changes 11/13/2016    Past Surgical History:  Procedure Laterality Date  . CORONARY ANGIOPLASTY WITH STENT PLACEMENT     2012  . PAIN PUMP IMPLANTATION N/A 08/29/2014   Procedure: Baclofen pump placement;  Surgeon: Maeola Harman, MD;  Location: [facility];  Service: Neurosurgery;  Laterality: N/A;  Baclofen pump placement  . PROGRAMABLE BACLOFEN PUMP REVISION  08/29/14  . right wrist surgery     CTR  . VASCULAR SURGERY     vericose vein in right femoral area  . VASECTOMY    . WISDOM TOOTH EXTRACTION      Current Outpatient Prescriptions  Medication Sig Dispense Refill  . aspirin EC 81 MG tablet Take 81 mg by mouth daily.    Marland Kitchen atorvastatin (LIPITOR) 40 MG tablet Take 1 tablet (40 mg total) by mouth daily at 6 PM. 90 tablet 3  . BACLOFEN IT by Intrathecal route.    . cetirizine (ZYRTEC) 10 MG tablet Take 10 mg by mouth daily as needed for allergies.     . clobetasol cream (TEMOVATE) 0.05 % Apply 1 application topically 2 (two) times daily. 60 g 1  . clonazePAM (KLONOPIN) 1 MG tablet TAKE 1 TABLET BY MOUTH 4 TIMES A DAY 120 tablet 1  . Clonidine  HCl POWD by Does not apply route. 720mg /ml IT Baclofen. Total dose 157.89mg /day. Last pump refill 06/02/16    . clotrimazole-betamethasone (LOTRISONE) cream Apply 1 application topically 2 (two) times daily as needed (anti-fungal). 30 g 0  . cyclobenzaprine (FLEXERIL) 10 MG tablet Take 1 tablet (10 mg total) by mouth 3 (three) times daily as needed for muscle spasms. 90 tablet 11  . dalfampridine (AMPYRA) 10 MG TB12 Take 1 tablet (10 mg total) by mouth 2 (two) times daily. Every 12 hours 60 tablet 11  . DULoxetine (CYMBALTA) 60 MG capsule Take 1 capsule (60 mg total) by mouth 2 (two) times daily. 180 capsule 5  . gabapentin (NEURONTIN) 300 MG capsule One po in am, one po  in evening and two po qHS 360 capsule 3  . ibuprofen (ADVIL,MOTRIN) 200 MG tablet Take 200 mg by mouth every 6 (six) hours as needed for moderate pain.    Marland Kitchen LORazepam (ATIVAN) 1 MG tablet 4 times a day as needed 120 tablet 3  . natalizumab (TYSABRI) 300 MG/15ML injection Inject 300 mg into the vein every 30 (thirty) days.    . nitroGLYCERIN (NITROSTAT) 0.4 MG SL tablet PLACE 1 TABLET UNDER THE TOUNGE EVERY 5 MINUTES AS NEEDED FOR CHEST PAIN (X 3 DOSES) 25 tablet 0  . olmesartan (BENICAR) 20 MG tablet TAKE 1 TABLET (20 MG TOTAL) BY MOUTH DAILY. 90 tablet 2  . SYNTHROID 150 MCG tablet TAKE ONE TABLET BY MOUTH DAILY BEFORE BREAKFAST 30 tablet 6  . tamsulosin (FLOMAX) 0.4 MG CAPS capsule TAKE ONE CAPSULE (0.4 MG TOTAL) BY MOUTH DAILY. 90 capsule 3  . methylphenidate (RITALIN) 10 MG tablet Take one pill po up to four times a day 120 tablet 0   No current facility-administered medications for this visit.     Allergies:   Patient has no known allergies.   Social History:  The patient  reports that he has never smoked. He has never used smokeless tobacco. He reports that he drinks alcohol. He reports that he does not use drugs.   Family History:  The patient's  family history includes Heart attack (age of onset: 88) in his mother; Heart disease in his father, maternal grandfather, maternal grandmother, and mother; Hyperlipidemia in his maternal grandfather and maternal grandmother; Hypertension in his maternal grandfather and maternal grandmother; Stroke in his paternal grandfather; Thyroid disease in his brother, mother, sister, and sister.    ROS:  Please see the history of present illness.   All other systems are reviewed and negative.    PHYSICAL EXAM: VS:  BP 102/60   Pulse 86   Ht 6\' 3"  (1.905 m)   Wt 287 lb 3.2 oz (130.3 kg)   BMI 35.90 kg/m  , BMI Body mass index is 35.9 kg/m. GEN: Well nourished, well developed, overweight male in no acute distress  HEENT: normal  Neck: no JVD, no  masses. No carotid bruits Cardiac: RRR without murmur or gallop                Respiratory:  clear to auscultation bilaterally, normal work of breathing GI: soft, nontender, nondistended, + BS MS: no deformity or atrophy  Ext: no pretibial edema, pedal pulses 2+= bilaterally Skin: warm and dry, no rash Neuro:  Strength and sensation are intact Psych: euthymic mood, full affect  EKG:  EKG is ordered today. The ekg ordered today shows normal sinus rhythm 77 bpm, within normal limits.  Recent Labs: 11/13/2016: ALT 19; BUN 16; Creatinine, Ser  0.86; Hemoglobin 14.2; Platelets 187.0; Potassium 4.1; Sodium 143; TSH 0.64   Lipid Panel     Component Value Date/Time   CHOL 149 06/13/2016 1154   TRIG 32.0 06/13/2016 1154   HDL 63.50 06/13/2016 1154   CHOLHDL 2 06/13/2016 1154   VLDL 6.4 06/13/2016 1154   LDLCALC 80 06/13/2016 1154      Wt Readings from Last 3 Encounters:  02/16/17 287 lb 3.2 oz (130.3 kg)  11/13/16 283 lb (128.4 kg)  10/06/16 278 lb (126.1 kg)     Cardiac Studies Reviewed: Myocardial Perfusion Scan 12-20-2015: Study Highlights    Nuclear stress EF: 59%. Akinesis in the basal and mid inferior walls and paradoxical septal motion.  There was no ST segment deviation noted during stress.  Defect 1: There is a medium defect of moderate severity present in the basal inferior, mid inferior and apical inferior location.  The study is normal.  Findings consistent with prior myocardial infarction with peri-infarct ischemia.  This is an intermediate risk study.  The left ventricular ejection fraction is mildly decreased (45-54%).   There is a scar in the basal and mid inferior walls with very mild ischemia in the mid and apical inferior walls (SDS 2).      ASSESSMENT AND PLAN: 1.  CAD, native vessel, without symptoms of angina: The patient is doing well on low-dose aspirin and a high intensity statin drug. He is having no anginal symptoms. His nuclear stress test  from last year is reviewed. I will plan to see him back in one year for follow-up.  2. Hyperlipidemia: Lipids are reviewed as above. LDL is 80 mg/dL, HDL was 96.0 mg/dL.  The patient is treated with atorvastatin 40 mg.  3. Essential hypertension: Blood pressure is well controlled with olmesartan.  Current medicines are reviewed with the patient today.  The patient does not have concerns regarding medicines.  Labs/ tests ordered today include:   Orders Placed This Encounter  Procedures  . EKG 12-Lead    Disposition:   FU one year  Signed, Tonny Bollman, MD  02/16/2017 10:43 AM    [facility] [address] [place], [place], [place]  [address] Phone: [phone]; Fax: [phone]

## 2017-02-16 ENCOUNTER — Ambulatory Visit (INDEPENDENT_AMBULATORY_CARE_PROVIDER_SITE_OTHER): Payer: Managed Care, Other (non HMO) | Admitting: Cardiovascular Disease

## 2017-02-16 ENCOUNTER — Encounter: Payer: Self-pay | Admitting: Cardiovascular Disease

## 2017-02-16 ENCOUNTER — Ambulatory Visit (INDEPENDENT_AMBULATORY_CARE_PROVIDER_SITE_OTHER): Payer: Managed Care, Other (non HMO) | Admitting: Neurology

## 2017-02-16 ENCOUNTER — Encounter: Payer: Self-pay | Admitting: Neurology

## 2017-02-16 VITALS — BP 110/68 | HR 74

## 2017-02-16 VITALS — BP 102/60 | HR 86 | Ht 75.0 in | Wt 287.2 lb

## 2017-02-16 DIAGNOSIS — R5383 Other fatigue: Secondary | ICD-10-CM | POA: Diagnosis not present

## 2017-02-16 DIAGNOSIS — R252 Cramp and spasm: Secondary | ICD-10-CM | POA: Diagnosis not present

## 2017-02-16 DIAGNOSIS — I2510 Atherosclerotic heart disease of native coronary artery without angina pectoris: Secondary | ICD-10-CM

## 2017-02-16 DIAGNOSIS — E785 Hyperlipidemia, unspecified: Secondary | ICD-10-CM | POA: Diagnosis not present

## 2017-02-16 DIAGNOSIS — R3915 Urgency of urination: Secondary | ICD-10-CM

## 2017-02-16 DIAGNOSIS — I10 Essential (primary) hypertension: Secondary | ICD-10-CM | POA: Diagnosis not present

## 2017-02-16 DIAGNOSIS — R269 Unspecified abnormalities of gait and mobility: Secondary | ICD-10-CM | POA: Diagnosis not present

## 2017-02-16 DIAGNOSIS — G35 Multiple sclerosis: Secondary | ICD-10-CM

## 2017-02-16 DIAGNOSIS — R208 Other disturbances of skin sensation: Secondary | ICD-10-CM | POA: Diagnosis not present

## 2017-02-16 MED ORDER — METHYLPHENIDATE HCL 10 MG PO TABS: ORAL_TABLET | ORAL | 0 refills | Status: DC

## 2017-02-16 MED ORDER — ATORVASTATIN CALCIUM 40 MG PO TABS: 40.0000 mg | ORAL_TABLET | Freq: Every day | ORAL | 3 refills | Status: DC

## 2017-02-16 NOTE — Patient Instructions (Signed)

## 2017-02-16 NOTE — Progress Notes (Signed)
HPI    Multiple Sclerosis   Additional comments: Continues to tolerate Tysabri well.  Here for pump refill today/fim     Last edited by Keith Pedro, RN on 02/16/2017  9:15 AM. (History)      HPI    Multiple Sclerosis   Additional comments: Continues to tolerate Tysabri well.  Here for pump refill today/fim     Last edited by Keith Pedro, RN on 02/16/2017  9:15 AM. (History)     Chief Complaint    Multiple Sclerosis      HISTORY:  Keith Morrow is a 46 year old man with a relapsing form of multiple sclerosis who has right greater than left leg weakness and progressive difficulties with gait function and spasticity.    MS:   Is currently on Tysabri continues to do well with no definite exacerbation.    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                       Gait:   Keith Morrow currently is using a Rollator walker around the house and at work. He feels he does worse in the afternoon's due to being more tired.Marland Kitchen He is doing physical therapy once a week.  He has always been weaker on the right leg but the left leg is becoming weaker as well.     Spasticity:   His spasticity is improved with the combination of baclofen and clonidine in his pump. As best on the morning and early afternoons and is little worse at night. His legs will lock up if he lays down flat but otherwise the spasticity is mostly controlled.     PHYSICAL EXAM  General: The patient is well-developed and well-nourished and in no acute distress.  Extremities:   No rash or edema.    Neurologic Exam  Mental status: The patient is alert and oriented x 3 at the time of the examination. Intact attention and recall.   Speech is normal.  CN:   Extraocular muscles are normal.  No ptosis.    Symmetric facial strength and sensation.    PE/TP midline  Motor: Muscle bulk is normal. Tone is borderline increased in right arm and slightly increased in R>L leg. Strength is 4+ / 5 in the right arm, 5/5 in the  left arm, 2+/5 in the right leg 3 to 4-/5 on the left.   Gait and station: He requires support to stand. He can use a Rollator walker. AFO on the right leg.  Reflexes: Deep tendon reflexes are 1 and symmetric bilaterally.No clonus   ASSESSMENT  MS (multiple sclerosis) (HCC) - Plan: Stratify JCV Antibody Test ([facility]), CBC with Differential/Platelet  Gait disturbance  Fatigue, unspecified type  Dysesthesia  Urinary urgency  Spasticity    PLAN: 1.   Refill pump with 3600 mg baclofen and 720 mg clonidine and reduce basal rate by 10% but add second night time bolus (60 mcg at 11 pm and 60 mcg at 2 am) 2.   Continue Tysabri.   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  3.   Continue Ritalin and other med's 4.  Return as scheduled or sooner if there are new or worsening neurologic issues.  We can make further adjustments if needed.  Richard A. Epimenio Foot, MD, PhD Certified in Neurology, Clinical Neurophysiology, Sleep Medicine, Pain Medicine and Neuroimaging  [facility] [address], [address] [place], [place] [address] [phone]

## 2017-02-17 LAB — CBC WITH DIFFERENTIAL/PLATELET
Basophils Absolute: 0.1 10*3/uL (ref 0.0–0.2)
Basos: 1 %
EOS (ABSOLUTE): 0.6 10*3/uL — ABNORMAL HIGH (ref 0.0–0.4)
Eos: 7 %
Hematocrit: 41.5 % (ref 37.5–51.0)
Hemoglobin: 14.3 g/dL (ref 13.0–17.7)
Immature Grans (Abs): 0.1 10*3/uL (ref 0.0–0.1)
Immature Granulocytes: 1 %
Lymphocytes Absolute: 2.7 10*3/uL (ref 0.7–3.1)
Lymphs: 34 %
MCH: 30.4 pg (ref 26.6–33.0)
MCHC: 34.5 g/dL (ref 31.5–35.7)
MCV: 88 fL (ref 79–97)
Monocytes Absolute: 0.7 10*3/uL (ref 0.1–0.9)
Monocytes: 9 %
Neutrophils Absolute: 3.8 10*3/uL (ref 1.4–7.0)
Neutrophils: 48 %
Platelets: 186 10*3/uL (ref 150–379)
RBC: 4.7 x10E6/uL (ref 4.14–5.80)
RDW: 14.6 % (ref 12.3–15.4)
WBC: 7.9 10*3/uL (ref 3.4–10.8)

## 2017-02-20 ENCOUNTER — Ambulatory Visit (INDEPENDENT_AMBULATORY_CARE_PROVIDER_SITE_OTHER): Payer: Managed Care, Other (non HMO) | Admitting: Family Medicine

## 2017-02-20 ENCOUNTER — Encounter: Payer: Self-pay | Admitting: Family Medicine

## 2017-02-20 VITALS — BP 119/65 | Temp 98.2°F | Wt 285.6 lb

## 2017-02-20 DIAGNOSIS — E785 Hyperlipidemia, unspecified: Secondary | ICD-10-CM

## 2017-02-20 DIAGNOSIS — K219 Gastro-esophageal reflux disease without esophagitis: Secondary | ICD-10-CM

## 2017-02-20 DIAGNOSIS — I10 Essential (primary) hypertension: Secondary | ICD-10-CM

## 2017-02-20 DIAGNOSIS — H539 Unspecified visual disturbance: Secondary | ICD-10-CM

## 2017-02-20 DIAGNOSIS — K5909 Other constipation: Secondary | ICD-10-CM | POA: Insufficient documentation

## 2017-02-20 DIAGNOSIS — E6609 Other obesity due to excess calories: Secondary | ICD-10-CM | POA: Diagnosis not present

## 2017-02-20 DIAGNOSIS — G35 Multiple sclerosis: Secondary | ICD-10-CM | POA: Diagnosis not present

## 2017-02-20 DIAGNOSIS — E349 Endocrine disorder, unspecified: Secondary | ICD-10-CM | POA: Diagnosis not present

## 2017-02-20 DIAGNOSIS — K5900 Constipation, unspecified: Secondary | ICD-10-CM

## 2017-02-20 HISTORY — DX: Constipation, unspecified: K59.00

## 2017-02-20 LAB — TESTOSTERONE: Testosterone: 237.55 ng/dL — ABNORMAL LOW (ref 300.00–890.00)

## 2017-02-20 LAB — TSH: TSH: 1.4 u[IU]/mL (ref 0.35–4.50)

## 2017-02-20 MED ORDER — TAMSULOSIN HCL 0.4 MG PO CAPS: ORAL_CAPSULE | ORAL | 3 refills | Status: DC

## 2017-02-20 MED ORDER — CLOTRIMAZOLE-BETAMETHASONE 1-0.05 % EX CREA: 1.0000 "application " | TOPICAL_CREAM | Freq: Two times a day (BID) | CUTANEOUS | 2 refills | Status: DC | PRN

## 2017-02-20 MED ORDER — RANITIDINE HCL 300 MG PO TABS: 300.0000 mg | ORAL_TABLET | Freq: Every day | ORAL | 3 refills | Status: DC

## 2017-02-20 MED ORDER — CLOBETASOL PROPIONATE 0.05 % EX CREA: 1.0000 "application " | TOPICAL_CREAM | Freq: Two times a day (BID) | CUTANEOUS | 1 refills | Status: DC | PRN

## 2017-02-20 NOTE — Assessment & Plan Note (Signed)
Well controlled, no changes to meds. Encouraged heart healthy diet such as the DASH diet and exercise as tolerated.  °

## 2017-02-20 NOTE — Progress Notes (Signed)
Pre visit review using our clinic review tool, if applicable. No additional management support is needed unless otherwise documented below in the visit note. 

## 2017-02-20 NOTE — Progress Notes (Signed)
Patient ID: Keith Morrow, male   DOB: [birth year], 46 y.o.   MRN: [id number]   Subjective:  I acted as a Neurosurgeon for Keith Edge, MD. [place], [place]   Patient ID: Keith Morrow, male    DOB: 1971/11/18, 46 y.o.   MRN: [id number]  Chief Complaint  Patient presents with  . Hypertension    F/U.    Hypertension  This is a chronic problem. The problem is controlled. Associated symptoms include malaise/fatigue. Pertinent negatives include no blurred vision, chest pain, headaches, palpitations or shortness of breath. Risk factors for coronary artery disease include male gender and obesity.    Patient is in today for a 1-month follow up for hypertension. Patient also has a complaint of two warts on his right foot. Patient also has a Hx of GERD, dermatitis, insomnia. Patient has no additional acute noted at this time. He continues to struggle with constipation, has to strain to move his bowels every couple of days, no bloody or tarry stool. He continues to get weaker and have worsening gait. He continues with PT and they are working on renovating his home to be wheelchair accessible. Denies CP/palp/SOB/HA/congestion/fevers or GU c/o. Taking meds as prescribed   Patient Care Team: [name], MD as PCP - General (Family Medicine)   Past Medical History:  Diagnosis Date  . Acute bronchitis 05/09/2013  . Allergy   . Bronchitis, acute 12/10/2011  . Chicken pox as a child  . Constipation 02/20/2017  . Coronary atherosclerosis of native coronary artery    a. NSTEMI 04/20/11: tx with Promus DES to LAD; bifurcation lesion with oDx 90% tx with POBA; b. staged PCI of right post. AV Branch with Promus DES; c. residual at cath 04/21/11:  AV groove CFX 50%, mRCA 30%,; EF 55%  . Depression   . Depression with anxiety   . Dermatitis, contact 12/10/2011  . Fatigue 10/15/2012  . Heart attack 04-20-11  . HTN (hypertension)   . Hyperlipidemia   . Hypotestosteronism 11/17/2011  . Hypothyroidism   . Keloid scar     . MS (multiple sclerosis) (HCC)   . Neuromuscular disorder (HCC)    NUMBNESS/TINGLING  . Obesity   . Onychomycosis   . Preventative health care 10/07/2011  . Reflux 10/07/2011  . Sun-damaged skin 02/17/2016  . Testosterone deficiency 01/16/2012  . Tinea pedis   . 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 Visual changes 11/13/2016    Past Surgical History:  Procedure Laterality Date  . CORONARY ANGIOPLASTY WITH STENT PLACEMENT     2012  . PAIN PUMP IMPLANTATION N/A 08/29/2014   Procedure: Baclofen pump placement;  Surgeon: Maeola Harman, MD;  Location: [facility];  Service: Neurosurgery;  Laterality: N/A;  Baclofen pump placement  . PROGRAMABLE BACLOFEN PUMP REVISION  08/29/14  . right wrist surgery     CTR  . VASCULAR SURGERY     vericose vein in right femoral area  . VASECTOMY    . WISDOM TOOTH EXTRACTION      Family History  Problem Relation Age of Onset  . Coronary artery disease      questionable in father  . Heart attack Mother 55  . Thyroid disease Mother   . Heart disease Mother   . Thyroid disease Sister   . Thyroid disease Brother   . Heart disease Maternal Grandmother   . Hypertension Maternal Grandmother   . Hyperlipidemia Maternal Grandmother   . Heart disease Maternal Grandfather   .  Hypertension Maternal Grandfather   . Hyperlipidemia Maternal Grandfather   . Stroke Paternal Grandfather   . Thyroid disease Sister   . Heart disease Father     2 stents    Social History   Social History  . Marital status: Married    Spouse name: N/A  . Number of children: 3  . Years of education: N/A   Occupational History  . Not on file.   Social History Main Topics  . Smoking status: Never Smoker  . Smokeless tobacco: Never Used  . Alcohol use Yes     Comment: socially  . Drug use: No  . 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 Not  on file   Social History Narrative  . No narrative on file    Outpatient Medications Prior to Visit  Medication Sig Dispense Refill  . aspirin EC 81 MG tablet Take 81 mg by mouth daily.    Marland Kitchen atorvastatin (LIPITOR) 40 MG tablet Take 1 tablet (40 mg total) by mouth daily at 6 PM. 90 tablet 3  . BACLOFEN IT by Intrathecal route.    . cetirizine (ZYRTEC) 10 MG tablet Take 10 mg by mouth daily as needed for allergies.     . clonazePAM (KLONOPIN) 1 MG tablet TAKE 1 TABLET BY MOUTH 4 TIMES A DAY 120 tablet 1  . Clonidine HCl POWD by Does not apply route. 720mg /ml IT Baclofen. Total dose 157.89mg /day. Last pump refill 06/02/16    . cyclobenzaprine (FLEXERIL) 10 MG tablet Take 1 tablet (10 mg total) by mouth 3 (three) times daily as needed for muscle spasms. 90 tablet 11  . dalfampridine (AMPYRA) 10 MG TB12 Take 1 tablet (10 mg total) by mouth 2 (two) times daily. Every 12 hours 60 tablet 11  . DULoxetine (CYMBALTA) 60 MG capsule Take 1 capsule (60 mg total) by mouth 2 (two) times daily. 180 capsule 5  . gabapentin (NEURONTIN) 300 MG capsule One po in am, one po in evening and two po qHS 360 capsule 3  . ibuprofen (ADVIL,MOTRIN) 200 MG tablet Take 200 mg by mouth every 6 (six) hours as needed for moderate pain.    Marland Kitchen LORazepam (ATIVAN) 1 MG tablet 4 times a day as needed 120 tablet 3  . methylphenidate (RITALIN) 10 MG tablet Take one pill po up to four times a day 120 tablet 0  . natalizumab (TYSABRI) 300 MG/15ML injection Inject 300 mg into the vein every 30 (thirty) days.    . nitroGLYCERIN (NITROSTAT) 0.4 MG SL tablet PLACE 1 TABLET UNDER THE TOUNGE EVERY 5 MINUTES AS NEEDED FOR CHEST PAIN (X 3 DOSES) 25 tablet 0  . olmesartan (BENICAR) 20 MG tablet TAKE 1 TABLET (20 MG TOTAL) BY MOUTH DAILY. 90 tablet 2  . SYNTHROID 150 MCG tablet TAKE ONE TABLET BY MOUTH DAILY BEFORE BREAKFAST 30 tablet 6  . clobetasol cream (TEMOVATE) 0.05 % Apply 1 application topically 2 (two) times daily. 60 g 1  .  clotrimazole-betamethasone (LOTRISONE) cream Apply 1 application topically 2 (two) times daily as needed (anti-fungal). 30 g 0  . tamsulosin (FLOMAX) 0.4 MG CAPS capsule TAKE ONE CAPSULE (0.4 MG TOTAL) BY MOUTH DAILY. 90 capsule 3   No facility-administered medications prior to visit.     No Known Allergies  Review of Systems  Constitutional: Positive for malaise/fatigue. Negative for fever.  HENT: Negative for congestion.   Eyes: Negative for blurred vision.  Respiratory: Negative for cough and shortness of breath.   Cardiovascular: Negative for chest pain, palpitations and leg swelling.  Gastrointestinal: Positive for constipation and heartburn. Negative for vomiting.  Musculoskeletal: Negative for back pain.  Skin: Negative for rash.  Neurological: Positive for weakness. Negative for loss of consciousness and headaches.       Objective:    Physical Exam  Constitutional: He is oriented to person, place, and time. He appears well-developed and well-nourished. No distress.  walker  HENT:  Head: Normocephalic and atraumatic.  Eyes: Conjunctivae are normal.  Neck: Normal range of motion. No thyromegaly present.  Cardiovascular: Normal rate and regular rhythm.   Pulmonary/Chest: Effort normal and breath sounds normal. He has no wheezes.  Abdominal: Soft. Bowel sounds are normal. There is no tenderness.  Musculoskeletal: He exhibits no edema or deformity.  Neurological: He is alert and oriented to person, place, and time. No cranial nerve deficit.  Unsteady gait with walker  Skin: Skin is warm and dry. He is not diaphoretic.  Psychiatric: He has a normal mood and affect.    BP 119/65 (BP Location: Left Arm, Patient Position: Sitting, Cuff Size: Large)   Temp 98.2 F (36.8 C) (Oral)   Wt 285 lb 9.6 oz (129.5 kg)   SpO2 100% Comment: RA  BMI 35.70 kg/m  Wt Readings from Last 3 Encounters:  02/20/17 285 lb 9.6 oz (129.5 kg)  02/16/17 287 lb 3.2 oz (130.3 kg)  11/13/16 283  lb (128.4 kg)      Immunization History  Administered Date(s) Administered  . Influenza Split 10/06/2011, 10/15/2012  . Influenza,inj,Quad PF,36+ Mos 11/01/2013, 01/05/2015, 09/03/2015, 11/13/2016  . Tdap 12/08/2006    Health Maintenance  Topic Date Due  . HIV Screening  02/22/1988  . TETANUS/TDAP  12/08/2016  . INFLUENZA VACCINE  Completed    Lab Results  Component Value Date   WBC 7.9 02/16/2017   HGB 14.2 11/13/2016   HCT 41.5 02/16/2017   PLT 186 02/16/2017   GLUCOSE 96 11/13/2016   CHOL 149 06/13/2016   TRIG 32.0 06/13/2016   HDL 63.50 06/13/2016   LDLCALC 80 06/13/2016   ALT 19 11/13/2016   AST 16 11/13/2016   NA 143 11/13/2016   K 4.1 11/13/2016   CL 104 11/13/2016   CREATININE 0.86 11/13/2016   BUN 16 11/13/2016   CO2 35 (H) 11/13/2016   TSH 1.40 02/20/2017   PSA 0.52 02/11/2016   INR 1.00 04/21/2011    Lab Results  Component Value Date   TSH 1.40 02/20/2017   Lab Results  Component Value Date   WBC 7.9 02/16/2017   HGB 14.2 11/13/2016   HCT 41.5 02/16/2017   MCV 88 02/16/2017   PLT 186 02/16/2017   Lab Results  Component Value Date   NA 143 11/13/2016   K 4.1 11/13/2016   CO2 35 (H) 11/13/2016   GLUCOSE 96 11/13/2016   BUN 16 11/13/2016   CREATININE 0.86 11/13/2016   BILITOT 0.6 11/13/2016   ALKPHOS 58 11/13/2016   AST 16 11/13/2016   ALT 19 11/13/2016   PROT 6.9 11/13/2016   ALBUMIN 4.4 11/13/2016   CALCIUM 9.2 11/13/2016   ANIONGAP 10 12/07/2015   GFR 102.81 11/13/2016   Lab Results  Component Value Date   CHOL 149 06/13/2016   Lab Results  Component Value Date   HDL 63.50 06/13/2016   Lab Results  Component Value Date  LDLCALC 80 06/13/2016   Lab Results  Component Value Date   TRIG 32.0 06/13/2016   Lab Results  Component Value Date   CHOLHDL 2 06/13/2016   No results found for: HGBA1C       Assessment & Plan:   Problem List Items Addressed This Visit    MS (multiple sclerosis) (HCC)    They have an  Surveyor, minerals up to design a wheelchair friendly home still working with PT      Relevant Orders   Testosterone (Completed)   Hyperlipidemia    Tolerating statin, encouraged heart healthy diet, avoid trans fats, minimize simple carbs and saturated fats. Increase exercise as tolerated      HTN (hypertension)    Well controlled, no changes to meds. Encouraged heart healthy diet such as the DASH diet and exercise as tolerated.       Relevant Orders   TSH (Completed)   Obesity    Encouraged DASH diet, decrease po intake and increase exercise as tolerated. Needs 7-8 hours of sleep nightly. Avoid trans fats, eat small, frequent meals every 4-5 hours with lean proteins, complex carbs and healthy fats. Minimize simple carbs, referred to bariatrics      GERD (gastroesophageal reflux disease)    Flared qhs recently. Start Ranitdine 300 mg qhs and can add Omeprazole 20 mg q am prn. Avoid offending foods, start probiotics. Do not eat large meals in late evening and consider raising head of bed.       Relevant Medications   ranitidine (ZANTAC) 300 MG tablet   Hypotestosteronism    Check level today, has not been taking supplements.       Relevant Orders   Testosterone, free   Visual changes - Primary   Relevant Orders   Ambulatory referral to Ophthalmology   Constipation    Encouraged increased hydration and fiber in diet. Daily probiotics. If bowels not moving can use MOM 2 tbls po in 4 oz of warm prune juice by mouth every 2-3 days. If no results then repeat in 4 hours with  Dulcolax suppository pr, may repeat again in 4 more hours as needed. Seek care if symptoms worsen. Consider daily Miralax and/or Dulcolax if symptoms persist. Fiber supplement twice daily and Miralax 1 to 2 x daily due to worsening constipation, moving bowels every 3-4 days         I have changed Mr. Delap clobetasol cream. I am also having him start on ranitidine. Additionally, I am having him maintain his  cetirizine, aspirin EC, ibuprofen, BACLOFEN IT, LORazepam, natalizumab, DULoxetine, cyclobenzaprine, SYNTHROID, dalfampridine, gabapentin, nitroGLYCERIN, clonazePAM, olmesartan, Clonidine HCl, atorvastatin, methylphenidate, clotrimazole-betamethasone, and tamsulosin.  Meds ordered this encounter  Medications  . clotrimazole-betamethasone (LOTRISONE) cream    Sig: Apply 1 application topically 2 (two) times daily as needed (anti-fungal).    Dispense:  30 g    Refill:  2  . tamsulosin (FLOMAX) 0.4 MG CAPS capsule    Sig: TAKE ONE CAPSULE (0.4 MG TOTAL) BY MOUTH DAILY.    Dispense:  90 capsule    Refill:  3  . clobetasol cream (TEMOVATE) 0.05 %    Sig: Apply 1 application topically 2 (two) times daily as needed.    Dispense:  60 g    Refill:  1  . ranitidine (ZANTAC) 300 MG tablet    Sig: Take 1 tablet (300 mg total) by mouth at bedtime.    Dispense:  30 tablet    Refill:  3  CMA served as Education administrator during this visit. History, Physical and Plan performed by medical provider. Documentation and orders reviewed and attested to.  Penni Homans, MD

## 2017-02-20 NOTE — Assessment & Plan Note (Signed)
Tolerating statin, encouraged heart healthy diet, avoid trans fats, minimize simple carbs and saturated fats. Increase exercise as tolerated 

## 2017-02-20 NOTE — Patient Instructions (Signed)
Testosterone Why am I having this test? Testosterone is a hormone made by the male's testicles and by the adrenal glands, which are a pair of glands on top of the kidneys. Starting at puberty, testosterone stimulates the development of secondary sex characteristics. This includes a deeper voice, growth of muscles and body hair, and penis enlargement. Females also produce testosterone in both the adrenal glands and ovaries. A male's body converts testosterone into estradiol, the main male sex hormone. An abnormal level of testosterone can cause health issues in both males and females. You may have this test if your health care provider suspects that an abnormal testosterone level is causing or contributing to other health problems. In males, symptoms of an abnormal testosterone level include:  Infertility.  Erectile dysfunction.  Delayed puberty or premature puberty. In females, symptoms of an abnormally high testosterone level include:  Infertility.  Polycystic ovarian syndrome (PCOS).  Developing masculine features (virilization). What kind of sample is taken? This test requires a blood sample taken from a vein in your arm or hand. The sample for this test is usually collected in the morning. The amount of testosterone in your blood is highest at that time. What do the results mean? It is your responsibility to obtain your test results. Ask the lab or department performing the test when and how you will get your results. Contact your health care provider to discuss any questions you have about your results. The result of a blood test for testosterone will be given as a range of values. A testosterone level that is outside the normal range may indicate a health problem. Testosterone is measured in nanograms per deciliter (ng/dL). Range of normal values  Ranges for normal values may vary among different labs and hospitals. You should always check with your health care provider after  having lab work or other tests done to discuss whether your values are considered within normal limits. Normal levels of total testosterone are as follows:  Male:  7 months to 44 years old: less than 30 ng/dL.  10-13 years old: less than 300 ng/dL.  14-15 years old: 170-540 ng/dL.  16-19 years old: 250-910 ng/dL.  44 years old and over: 280-1,080 ng/dL.  Male:  7 months to 44 years old: less than 30 ng/dL.  10-13 years old: less than 40 ng/dL.  14-15 years old: less than 60 ng/dL.  16-19 years old: less than 70 ng/dL.  44 years old and over: less than 70 ng/dL. Meaning of results outside normal value ranges  A testosterone level that is too low or too high can indicate a number of health problems. In males:  A high testosterone level can occur if you:  Have certain types of tumors.  Have an overactive thyroid gland (hyperthyroidism).  Use anabolic steroids.  Are starting puberty early (precocious puberty).  Have an inherited disorder that affects the adrenal glands (congenital adrenal hyperplasia).  A low testosterone level can occur if you:  Have certain genetic diseases.  Have had certain viral infections, such as mumps.  Have pituitary disease.  Have had an injury to the testicles.  Are an alcoholic. In females:  A high testosterone level can occur if you have:  Certain types of tumors.  An inherited disorder that affects certain cells in the adrenal glands (congenital adrenocortical hyperplasia).  PCOS.  A low testosterone level does not cause health problems. Discuss the results of your testosterone test with your health care provider. Your health care provider will use   the results of this test and other tests to make a diagnosis. Talk with your health care provider to discuss your results, treatment options, and if necessary, the need for more tests. Talk with your health care provider if you have any questions about your results. This  information is not intended to replace advice given to you by your health care provider. Make sure you discuss any questions you have with your health care provider. Document Released: 12/11/2004 Document Revised: 07/26/2016 Document Reviewed: 03/22/2014 Elsevier Interactive Patient Education  2017 Elsevier Inc.  

## 2017-02-20 NOTE — Assessment & Plan Note (Signed)
Flared qhs recently. Start Ranitdine 300 mg qhs and can add Omeprazole 20 mg q am prn. Avoid offending foods, start probiotics. Do not eat large meals in late evening and consider raising head of bed.

## 2017-02-20 NOTE — Assessment & Plan Note (Signed)
Encouraged DASH diet, decrease po intake and increase exercise as tolerated. Needs 7-8 hours of sleep nightly. Avoid trans fats, eat small, frequent meals every 4-5 hours with lean proteins, complex carbs and healthy fats. Minimize simple carbs, referred to bariatrics 

## 2017-02-20 NOTE — Assessment & Plan Note (Signed)
Encouraged increased hydration and fiber in diet. Daily probiotics. If bowels not moving can use MOM 2 tbls po in 4 oz of warm prune juice by mouth every 2-3 days. If no results then repeat in 4 hours with  Dulcolax suppository pr, may repeat again in 4 more hours as needed. Seek care if symptoms worsen. Consider daily Miralax and/or Dulcolax if symptoms persist. Fiber supplement twice daily and Miralax 1 to 2 x daily due to worsening constipation, moving bowels every 3-4 days

## 2017-02-20 NOTE — Assessment & Plan Note (Signed)
They have an Surveyor, minerals up to design a wheelchair friendly home still working with PT

## 2017-02-20 NOTE — Assessment & Plan Note (Addendum)
Check level today, has not been taking supplements.

## 2017-02-23 ENCOUNTER — Encounter: Payer: Self-pay | Admitting: Neurology

## 2017-02-23 ENCOUNTER — Encounter: Payer: Self-pay | Admitting: *Deleted

## 2017-02-25 LAB — TESTOSTERONE, FREE AND TOTAL (INCLUDES SHBG)-(MALES)
Sex Hormone Binding: 52 nmol/L — ABNORMAL HIGH (ref 10–50)
Testosterone, Free: 55.9 pg/mL (ref 47.0–244.0)
Testosterone-% Free: 1.5 % — ABNORMAL LOW (ref 1.6–2.9)
Testosterone: 375 ng/dL (ref 250–827)

## 2017-02-27 ENCOUNTER — Ambulatory Visit (INDEPENDENT_AMBULATORY_CARE_PROVIDER_SITE_OTHER): Payer: Managed Care, Other (non HMO) | Admitting: Psychology

## 2017-02-27 DIAGNOSIS — F4323 Adjustment disorder with mixed anxiety and depressed mood: Secondary | ICD-10-CM

## 2017-03-05 ENCOUNTER — Encounter: Payer: Self-pay | Admitting: Neurology

## 2017-03-20 ENCOUNTER — Ambulatory Visit (INDEPENDENT_AMBULATORY_CARE_PROVIDER_SITE_OTHER): Payer: Managed Care, Other (non HMO) | Admitting: Psychology

## 2017-03-20 DIAGNOSIS — F4323 Adjustment disorder with mixed anxiety and depressed mood: Secondary | ICD-10-CM | POA: Diagnosis not present

## 2017-04-03 ENCOUNTER — Ambulatory Visit (INDEPENDENT_AMBULATORY_CARE_PROVIDER_SITE_OTHER): Payer: Managed Care, Other (non HMO) | Admitting: Psychology

## 2017-04-03 DIAGNOSIS — F4323 Adjustment disorder with mixed anxiety and depressed mood: Secondary | ICD-10-CM

## 2017-04-17 ENCOUNTER — Ambulatory Visit (INDEPENDENT_AMBULATORY_CARE_PROVIDER_SITE_OTHER): Payer: Managed Care, Other (non HMO) | Admitting: Psychology

## 2017-04-17 DIAGNOSIS — F4323 Adjustment disorder with mixed anxiety and depressed mood: Secondary | ICD-10-CM | POA: Diagnosis not present

## 2017-04-22 ENCOUNTER — Other Ambulatory Visit: Payer: Self-pay | Admitting: Family Medicine

## 2017-04-24 ENCOUNTER — Other Ambulatory Visit: Payer: Self-pay | Admitting: Neurology

## 2017-05-01 ENCOUNTER — Ambulatory Visit: Payer: Managed Care, Other (non HMO) | Admitting: Psychology

## 2017-05-05 ENCOUNTER — Encounter: Payer: Self-pay | Admitting: Family Medicine

## 2017-05-05 ENCOUNTER — Ambulatory Visit (INDEPENDENT_AMBULATORY_CARE_PROVIDER_SITE_OTHER): Payer: Managed Care, Other (non HMO) | Admitting: Family Medicine

## 2017-05-05 VITALS — BP 119/67 | HR 90 | Temp 98.3°F | Resp 18 | Wt 279.8 lb

## 2017-05-05 DIAGNOSIS — F418 Other specified anxiety disorders: Secondary | ICD-10-CM

## 2017-05-05 DIAGNOSIS — I10 Essential (primary) hypertension: Secondary | ICD-10-CM

## 2017-05-05 DIAGNOSIS — E039 Hypothyroidism, unspecified: Secondary | ICD-10-CM | POA: Diagnosis not present

## 2017-05-05 DIAGNOSIS — E6609 Other obesity due to excess calories: Secondary | ICD-10-CM | POA: Diagnosis not present

## 2017-05-05 DIAGNOSIS — E785 Hyperlipidemia, unspecified: Secondary | ICD-10-CM | POA: Diagnosis not present

## 2017-05-05 DIAGNOSIS — G4700 Insomnia, unspecified: Secondary | ICD-10-CM

## 2017-05-05 DIAGNOSIS — K5900 Constipation, unspecified: Secondary | ICD-10-CM | POA: Diagnosis not present

## 2017-05-05 DIAGNOSIS — B372 Candidiasis of skin and nail: Secondary | ICD-10-CM | POA: Diagnosis not present

## 2017-05-05 DIAGNOSIS — K219 Gastro-esophageal reflux disease without esophagitis: Secondary | ICD-10-CM | POA: Diagnosis not present

## 2017-05-05 DIAGNOSIS — G35 Multiple sclerosis: Secondary | ICD-10-CM

## 2017-05-05 DIAGNOSIS — Z0000 Encounter for general adult medical examination without abnormal findings: Secondary | ICD-10-CM | POA: Diagnosis not present

## 2017-05-05 HISTORY — DX: Candidiasis of skin and nail: B37.2

## 2017-05-05 MED ORDER — NYSTATIN 100000 UNIT/GM EX CREA: 1.0000 "application " | TOPICAL_CREAM | Freq: Two times a day (BID) | CUTANEOUS | 2 refills | Status: DC

## 2017-05-05 NOTE — Assessment & Plan Note (Addendum)
Notes his Ranitidine 300 mg qhs and he notes the symptoms are generally well controlled til midday the next day. About 2-3 days per week takes 2-3 tabs of Tums with good relief. Will restart the Prilosec and will proceed with referral to gastroenterology due to numerous years with symptoms

## 2017-05-05 NOTE — Patient Instructions (Addendum)
   Preventive Care 18-39 Years, Male Preventive care refers to lifestyle choices and visits with your health care provider that can promote health and wellness. What does preventive care include?  A yearly physical exam. This is also called an annual well check.  Dental exams once or twice a year.  Routine eye exams. Ask your health care provider how often you should have your eyes checked.  Personal lifestyle choices, including:  Daily care of your teeth and gums.  Regular physical activity.  Eating a healthy diet.  Avoiding tobacco and drug use.  Limiting alcohol use.  Practicing safe sex. What happens during an annual well check? The services and screenings done by your health care provider during your annual well check will depend on your age, overall health, lifestyle risk factors, and family history of disease. Counseling  Your health care provider may ask you questions about your:  Alcohol use.  Tobacco use.  Drug use.  Emotional well-being.  Home and relationship well-being.  Sexual activity.  Eating habits.  Work and work environment. Screening  You may have the following tests or measurements:  Height, weight, and BMI.  Blood pressure.  Lipid and cholesterol levels. These may be checked every 5 years starting at age 20.  Diabetes screening. This is done by checking your blood sugar (glucose) after you have not eaten for a while (fasting).  Skin check.  Hepatitis C blood test.  Hepatitis B blood test.  Sexually transmitted disease (STD) testing. Discuss your test results, treatment options, and if necessary, the need for more tests with your health care provider. Vaccines  Your health care provider may recommend certain vaccines, such as:  Influenza vaccine. This is recommended every year.  Tetanus, diphtheria, and acellular pertussis (Tdap, Td) vaccine. You may need a Td booster every 10 years.  Varicella vaccine. You may need this if  you have not been vaccinated.  HPV vaccine. If you are 26 or younger, you may need three doses over 6 months.  Measles, mumps, and rubella (MMR) vaccine. You may need at least one dose of MMR.You may also need a second dose.  Pneumococcal 13-valent conjugate (PCV13) vaccine. You may need this if you have certain conditions and have not been vaccinated.  Pneumococcal polysaccharide (PPSV23) vaccine. You may need one or two doses if you smoke cigarettes or if you have certain conditions.  Meningococcal vaccine. One dose is recommended if you are age 19-21 years and a first-year college student living in a residence hall, or if you have one of several medical conditions. You may also need additional booster doses.  Hepatitis A vaccine. You may need this if you have certain conditions or if you travel or work in places where you may be exposed to hepatitis A.  Hepatitis B vaccine. You may need this if you have certain conditions or if you travel or work in places where you may be exposed to hepatitis B.  Haemophilus influenzae type b (Hib) vaccine. You may need this if you have certain risk factors. Talk to your health care provider about which screenings and vaccines you need and how often you need them. This information is not intended to replace advice given to you by your health care provider. Make sure you discuss any questions you have with your health care provider. Document Released: 01/20/2002 Document Revised: 08/13/2016 Document Reviewed: 09/25/2015 Elsevier Interactive Patient Education  2017 Elsevier Inc.  

## 2017-05-05 NOTE — Assessment & Plan Note (Signed)
Encouraged good sleep hygiene such as dark, quiet room. No blue/green glowing lights such as computer screens in bedroom. No alcohol or stimulants in evening. Cut down on caffeine as able. Regular exercise is helpful but not just prior to bed time.  

## 2017-05-05 NOTE — Assessment & Plan Note (Signed)
Tolerating statin, encouraged heart healthy diet, avoid trans fats, minimize simple carbs and saturated fats. Increase exercise as tolerated 

## 2017-05-05 NOTE — Assessment & Plan Note (Signed)
Patient encouraged to maintain heart healthy diet, regular exercise, adequate sleep. Consider daily probiotics. Take medications as prescribed 

## 2017-05-05 NOTE — Assessment & Plan Note (Signed)
On Levothyroxine, continue to monitor 

## 2017-05-05 NOTE — Assessment & Plan Note (Signed)
Perianal region. Cleanse with [facility] Astringent and then apply Nystatin cream bid prn and report worsening symptoms

## 2017-05-05 NOTE — Assessment & Plan Note (Signed)
Following with [facility] and he acknowledges it is helping. They are in the process of getting the house ready to be handicap accessible

## 2017-05-05 NOTE — Assessment & Plan Note (Signed)
Doing well on current meds and with counseling no changes for now despite hsi condition worsening

## 2017-05-05 NOTE — Assessment & Plan Note (Signed)
Well controlled, no changes to meds. Encouraged heart healthy diet such as the DASH diet and exercise as tolerated.  °

## 2017-05-05 NOTE — Progress Notes (Signed)
Subjective:  I acted as a Neurosurgeon for [name]. Keith Morrow, Keith Morrow  Patient ID: Keith Morrow, male    DOB: Nov 25, 1971, 46 y.o.   MRN: [id number]  Chief Complaint  Patient presents with  . Annual Exam  . Hypertension    HPI  Patient is in today for an annual exam. Patient is following up on his HTN, hyperlipidemia, and other medical concerns.. HE c/o a rash on his buttocks, he states it may either be yeast or hemorrhoids, he denies any discharge or pain. He is currently having trouble with heartburn, we will send referral to GI and he will began taking Prilosec otc to 2 times a day to see if it helps.     No recent febrile illness or acute hospitalizations. Denies CP/palp/SOB/HA/congestion/fevers/GI or GU c/o. Taking meds as prescribed. Rectal irritation has been off and on for about a month. No blood or severe pain. His heartburn flared when he stopped the am Prilosec and occurs 2-3 days a week and resolves with 2 or 3 Tums. They are working on settingup their home to be handicap accessible due to his progressive weakness due to his MS. He continues to work with PT and see a Veterinary surgeon and feels he is handling his stressors well.    Patient Care Team: Keith Canary, MD as PCP - General (Family Medicine)   Past Medical History:  Diagnosis Date  . Acute bronchitis 05/09/2013  . Allergy   . Bronchitis, acute 12/10/2011  . Chicken pox as a child  . Constipation 02/20/2017  . Coronary atherosclerosis of native coronary artery    a. NSTEMI 04/20/11: tx with Promus DES to LAD; bifurcation lesion with oDx 90% tx with POBA; b. staged PCI of right post. AV Branch with Promus DES; c. residual at cath 04/21/11:  AV groove CFX 50%, mRCA 30%,; EF 55%  . Depression   . Depression with anxiety   . Dermatitis, contact 12/10/2011  . Fatigue 10/15/2012  . Heart attack (HCC) 04-20-11  . HTN (hypertension)   . Hyperlipidemia   . Hypotestosteronism 11/17/2011  . Hypothyroidism   . Keloid scar   . MS (multiple  sclerosis) (HCC)   . Neuromuscular disorder (HCC)    NUMBNESS/TINGLING  . Obesity   . Onychomycosis   . Preventative health care 10/07/2011  . Reflux 10/07/2011  . Sun-damaged skin 02/17/2016  . Testosterone deficiency 01/16/2012  . Tinea pedis   . 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 Visual changes 11/13/2016    Past Surgical History:  Procedure Laterality Date  . CORONARY ANGIOPLASTY WITH STENT PLACEMENT     2012  . PAIN PUMP IMPLANTATION N/A 08/29/2014   Procedure: Baclofen pump placement;  Surgeon: Maeola Harman, MD;  Location: [facility];  Service: Neurosurgery;  Laterality: N/A;  Baclofen pump placement  . PROGRAMABLE BACLOFEN PUMP REVISION  08/29/14  . right wrist surgery     CTR  . VASCULAR SURGERY     vericose vein in right femoral area  . VASECTOMY    . WISDOM TOOTH EXTRACTION      Family History  Problem Relation Age of Onset  . Heart attack Mother 44  . Thyroid disease Mother   . Heart disease Mother   . Thyroid disease Sister   . Thyroid disease Brother   . Heart disease Maternal Grandmother   . Hypertension Maternal Grandmother   . Hyperlipidemia Maternal Grandmother   . Heart disease Maternal Grandfather   . Hypertension  Maternal Grandfather   . Hyperlipidemia Maternal Grandfather   . Stroke Paternal Grandfather   . Thyroid disease Sister   . Heart disease Father        2 stents  . Coronary artery disease Unknown        questionable in father    Social History   Social History  . Marital status: Married    Spouse name: N/A  . Number of children: 3  . Years of education: N/A   Occupational History  . Not on file.   Social History Main Topics  . Smoking status: Never Smoker  . Smokeless tobacco: Never Used  . Alcohol use Yes     Comment: socially  . Drug use: No  . 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 Not on  file   Social History Narrative  . No narrative on file    Outpatient Medications Prior to Visit  Medication Sig Dispense Refill  . AMPYRA 10 MG TB12 TAKE 1 TABLET BY MOUTH EVERY 12 HOURS 60 tablet 10  . aspirin EC 81 MG tablet Take 81 mg by mouth daily.    Marland Kitchen atorvastatin (LIPITOR) 40 MG tablet Take 1 tablet (40 mg total) by mouth daily at 6 PM. 90 tablet 3  . BACLOFEN IT by Intrathecal route.    . cetirizine (ZYRTEC) 10 MG tablet Take 10 mg by mouth daily as needed for allergies.     . clobetasol cream (TEMOVATE) 0.05 % Apply 1 application topically 2 (two) times daily as needed. 60 g 1  . clonazePAM (KLONOPIN) 1 MG tablet TAKE 1 TABLET BY MOUTH 4 TIMES A DAY 120 tablet 1  . Clonidine HCl POWD by Does not apply route. 720mg /ml IT Baclofen. Total dose 157.89mg /day. Last pump refill 06/02/16    . clotrimazole-betamethasone (LOTRISONE) cream Apply 1 application topically 2 (two) times daily as needed (anti-fungal). 30 g 2  . cyclobenzaprine (FLEXERIL) 10 MG tablet Take 1 tablet (10 mg total) by mouth 3 (three) times daily as needed for muscle spasms. 90 tablet 11  . DULoxetine (CYMBALTA) 60 MG capsule Take 1 capsule (60 mg total) by mouth 2 (two) times daily. 180 capsule 5  . gabapentin (NEURONTIN) 300 MG capsule One po in am, one po in evening and two po qHS 360 capsule 3  . ibuprofen (ADVIL,MOTRIN) 200 MG tablet Take 200 mg by mouth every 6 (six) hours as needed for moderate pain.    Marland Kitchen LORazepam (ATIVAN) 1 MG tablet 4 times a day as needed 120 tablet 3  . methylphenidate (RITALIN) 10 MG tablet Take one pill po up to four times a day 120 tablet 0  . natalizumab (TYSABRI) 300 MG/15ML injection Inject 300 mg into the vein every 30 (thirty) days.    . nitroGLYCERIN (NITROSTAT) 0.4 MG SL tablet PLACE 1 TABLET UNDER THE TOUNGE EVERY 5 MINUTES AS NEEDED FOR CHEST PAIN (X 3 DOSES) 25 tablet 0  . olmesartan (BENICAR) 20 MG tablet TAKE 1 TABLET (20 MG TOTAL) BY MOUTH DAILY. 90 tablet 2  . ranitidine  (ZANTAC) 300 MG tablet TAKE 1 TABLET BY MOUTH AT BEDTIME 30 tablet 3  . SYNTHROID 150 MCG tablet TAKE ONE TABLET BY MOUTH DAILY BEFORE BREAKFAST 30 tablet 6  . tamsulosin (FLOMAX) 0.4 MG CAPS capsule TAKE ONE CAPSULE (0.4 MG TOTAL) BY MOUTH DAILY. 90 capsule  3   No facility-administered medications prior to visit.     No Known Allergies  Review of Systems  Constitutional: Negative for fever and malaise/fatigue.  HENT: Negative for congestion.   Eyes: Negative for blurred vision.  Respiratory: Negative for cough and shortness of breath.   Cardiovascular: Negative for chest pain, palpitations and leg swelling.  Gastrointestinal: Negative for vomiting.  Musculoskeletal: Negative for back pain.  Skin: Positive for rash.       Rash on Buttocks   Neurological: Negative for loss of consciousness and headaches.       Objective:    Physical Exam  Constitutional: He is oriented to person, place, and time. He appears well-developed and well-nourished. No distress.  HENT:  Head: Normocephalic and atraumatic.  Eyes: Conjunctivae are normal.  Neck: Normal range of motion. No thyromegaly present.  Cardiovascular: Normal rate and regular rhythm.   Pulmonary/Chest: Effort normal and breath sounds normal. He has no wheezes.  Abdominal: Soft. Bowel sounds are normal. There is no tenderness.  Musculoskeletal: Normal range of motion. He exhibits no edema or deformity.  Neurological: He is alert and oriented to person, place, and time.  Skin: Skin is warm and dry. He is not diaphoretic.  Psychiatric: He has a normal mood and affect.    BP 119/67 (BP Location: Left Arm, Patient Position: Sitting, Cuff Size: Normal)   Pulse 90   Temp 98.3 F (36.8 C) (Oral)   Resp 18   Wt 279 lb 12.8 oz (126.9 kg)   SpO2 100%   BMI 34.97 kg/m  Wt Readings from Last 3 Encounters:  05/05/17 279 lb 12.8 oz (126.9 kg)  02/20/17 285 lb 9.6 oz (129.5 kg)  02/16/17 287 lb 3.2 oz (130.3 kg)   BP Readings from  Last 3 Encounters:  05/05/17 119/67  02/20/17 119/65  02/16/17 110/68     Immunization History  Administered Date(s) Administered  . Influenza Split 10/06/2011, 10/15/2012  . Influenza,inj,Quad PF,36+ Mos 11/01/2013, 01/05/2015, 09/03/2015, 11/13/2016  . Tdap 12/08/2006    Health Maintenance  Topic Date Due  . HIV Screening  02/22/1988  . TETANUS/TDAP  12/08/2016  . INFLUENZA VACCINE  07/08/2017    Lab Results  Component Value Date   WBC 7.9 02/16/2017   HGB 14.2 11/13/2016   HCT 41.5 02/16/2017   PLT 186 02/16/2017   GLUCOSE 96 11/13/2016   CHOL 149 06/13/2016   TRIG 32.0 06/13/2016   HDL 63.50 06/13/2016   LDLCALC 80 06/13/2016   ALT 19 11/13/2016   AST 16 11/13/2016   NA 143 11/13/2016   K 4.1 11/13/2016   CL 104 11/13/2016   CREATININE 0.86 11/13/2016   BUN 16 11/13/2016   CO2 35 (H) 11/13/2016   TSH 1.40 02/20/2017   PSA 0.52 02/11/2016   INR 1.00 04/21/2011    Lab Results  Component Value Date   TSH 1.40 02/20/2017   Lab Results  Component Value Date   WBC 7.9 02/16/2017   HGB 14.2 11/13/2016   HCT 41.5 02/16/2017   MCV 88 02/16/2017   PLT 186 02/16/2017   Lab Results  Component Value Date   NA 143 11/13/2016   K 4.1 11/13/2016   CO2 35 (H) 11/13/2016   GLUCOSE 96 11/13/2016   BUN 16 11/13/2016   CREATININE 0.86 11/13/2016   BILITOT 0.6 11/13/2016   ALKPHOS 58 11/13/2016   AST 16 11/13/2016   ALT 19 11/13/2016   PROT 6.9 11/13/2016   ALBUMIN 4.4 11/13/2016  CALCIUM 9.2 11/13/2016   ANIONGAP 10 12/07/2015   GFR 102.81 11/13/2016   Lab Results  Component Value Date   CHOL 149 06/13/2016   Lab Results  Component Value Date   HDL 63.50 06/13/2016   Lab Results  Component Value Date   LDLCALC 80 06/13/2016   Lab Results  Component Value Date   TRIG 32.0 06/13/2016   Lab Results  Component Value Date   CHOLHDL 2 06/13/2016   No results found for: HGBA1C       Assessment & Plan:   Problem List Items Addressed This  Visit      Cardiovascular and Mediastinum   HTN (hypertension) - Primary     Endocrine   Hypothyroidism     Other   Hyperlipidemia   Preventative health care      I am having Mr. Laker maintain his cetirizine, aspirin EC, ibuprofen, BACLOFEN IT, LORazepam, natalizumab, DULoxetine, cyclobenzaprine, SYNTHROID, gabapentin, nitroGLYCERIN, clonazePAM, olmesartan, Clonidine HCl, atorvastatin, methylphenidate, clotrimazole-betamethasone, tamsulosin, clobetasol cream, ranitidine, and AMPYRA.  No orders of the defined types were placed in this encounter.   CMA served as Neurosurgeon during this visit. History, Physical and Plan performed by medical provider. Documentation and orders reviewed and attested to.  Crissie Sickles, Keith Morrow

## 2017-05-05 NOTE — Assessment & Plan Note (Signed)
Encouraged DASH diet, decrease po intake and increase exercise as tolerated. Needs 7-8 hours of sleep nightly. Avoid trans fats, eat small, frequent meals every 4-5 hours with lean proteins, complex carbs and healthy fats. Minimize simple carbs, bariatric referral offered 

## 2017-05-05 NOTE — Assessment & Plan Note (Signed)
Encouraged increased hydration and fiber in diet. Daily probiotics. If bowels not moving can use MOM 2 tbls po in 4 oz of warm prune juice by mouth every 2-3 days. If no results then repeat in 4 hours with  Dulcolax suppository pr, may repeat again in 4 more hours as needed. Seek care if symptoms worsen. Consider daily Miralax and/or Dulcolax if symptoms persist.  

## 2017-05-06 ENCOUNTER — Encounter: Payer: Self-pay | Admitting: Family Medicine

## 2017-05-06 ENCOUNTER — Encounter: Payer: Self-pay | Admitting: Gastroenterology

## 2017-05-14 ENCOUNTER — Ambulatory Visit (INDEPENDENT_AMBULATORY_CARE_PROVIDER_SITE_OTHER): Payer: Managed Care, Other (non HMO) | Admitting: Psychology

## 2017-05-14 DIAGNOSIS — F4323 Adjustment disorder with mixed anxiety and depressed mood: Secondary | ICD-10-CM

## 2017-05-15 ENCOUNTER — Ambulatory Visit: Payer: Managed Care, Other (non HMO) | Admitting: Psychology

## 2017-05-16 ENCOUNTER — Other Ambulatory Visit: Payer: Self-pay | Admitting: Neurology

## 2017-05-16 ENCOUNTER — Other Ambulatory Visit: Payer: Self-pay | Admitting: Family Medicine

## 2017-05-21 ENCOUNTER — Ambulatory Visit: Payer: Self-pay | Admitting: Family Medicine

## 2017-06-01 ENCOUNTER — Ambulatory Visit (INDEPENDENT_AMBULATORY_CARE_PROVIDER_SITE_OTHER): Payer: Managed Care, Other (non HMO) | Admitting: Psychology

## 2017-06-01 DIAGNOSIS — F4323 Adjustment disorder with mixed anxiety and depressed mood: Secondary | ICD-10-CM | POA: Diagnosis not present

## 2017-06-02 ENCOUNTER — Encounter: Payer: Self-pay | Admitting: Gastroenterology

## 2017-06-02 ENCOUNTER — Ambulatory Visit (INDEPENDENT_AMBULATORY_CARE_PROVIDER_SITE_OTHER): Payer: Managed Care, Other (non HMO) | Admitting: Gastroenterology

## 2017-06-02 VITALS — BP 128/80 | HR 88 | Ht 75.0 in | Wt 283.0 lb

## 2017-06-02 DIAGNOSIS — G35 Multiple sclerosis: Secondary | ICD-10-CM | POA: Diagnosis not present

## 2017-06-02 DIAGNOSIS — I2510 Atherosclerotic heart disease of native coronary artery without angina pectoris: Secondary | ICD-10-CM

## 2017-06-02 DIAGNOSIS — K219 Gastro-esophageal reflux disease without esophagitis: Secondary | ICD-10-CM | POA: Diagnosis not present

## 2017-06-02 NOTE — Progress Notes (Signed)
[facility] Consult Note:  History: Keith Morrow 06/02/2017  Referring physician: [name], MD  Reason for consult/chief complaint: Gastroesophageal Reflux (Patient taking Prilosec, which has subsided symptoms)   Subjective  HPI:  This is a 46 year old man referred by primary care noted above for GERD symptoms. He reports that for several years he has had frequent heartburn that will often occur with offending foods. He has taken Prilosec regularly, usually 4 several weeks at a time before he discontinues it. He will often be good for a while after stopping the meds, with only rare heartburn. Things seem to worsen several months ago when he resumed drinking soda regularly. He is feeling somewhat better on omeprazole 20 mg taken every morning. He was having some nighttime symptoms of heartburn as well, and this has greatly improved with the addition of ranitidine 300 mg daily at bedtime. He denies dysphagia, odynophagia, nausea, vomiting, early satiety or weight loss. He tends toward constipation which she attributes to his MS. [name] denies rectal bleeding, and he has no family history of esophageal, stomach or colon cancer.   ROS:  Review of Systems  Constitutional: Positive for fatigue. Negative for appetite change and unexpected weight change.  HENT: Negative for mouth sores and voice change.   Eyes: Negative for pain and redness.  Respiratory: Negative for cough and shortness of breath.   Cardiovascular: Negative for chest pain and palpitations.  Genitourinary: Negative for dysuria and hematuria.  Musculoskeletal: Negative for arthralgias and myalgias.  Skin: Negative for pallor and rash.  Neurological: Positive for weakness. Negative for headaches.       Spasticity  Hematological: Negative for adenopathy.  Psychiatric/Behavioral: Positive for dysphoric mood.     Past Medical History: Past Medical History:  Diagnosis Date  . Acute bronchitis  05/09/2013  . Allergy   . Bronchitis, acute 12/10/2011  . Candidal skin infection 05/05/2017  . Chicken pox as a child  . Constipation 02/20/2017  . Coronary atherosclerosis of native coronary artery    a. NSTEMI 04/20/11: tx with Promus DES to LAD; bifurcation lesion with oDx 90% tx with POBA; b. staged PCI of right post. AV Branch with Promus DES; c. residual at cath 04/21/11:  AV groove CFX 50%, mRCA 30%,; EF 55%  . Depression   . Depression with anxiety   . Dermatitis, contact 12/10/2011  . Fatigue 10/15/2012  . Heart attack (HCC) 04-20-11  . HTN (hypertension)   . Hyperlipidemia   . Hypotestosteronism 11/17/2011  . Hypothyroidism   . Keloid scar   . MS (multiple sclerosis) (HCC)   . Neuromuscular disorder (HCC)    NUMBNESS/TINGLING  . Obesity   . Onychomycosis   . Preventative health care 10/07/2011  . Reflux 10/07/2011  . Sun-damaged skin 02/17/2016  . Testosterone deficiency 01/16/2012  . Tinea pedis   . 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 Visual changes 11/13/2016     Past Surgical History: Past Surgical History:  Procedure Laterality Date  . CORONARY ANGIOPLASTY WITH STENT PLACEMENT     2012  . PAIN PUMP IMPLANTATION N/A 08/29/2014   Procedure: Baclofen pump placement;  Surgeon: Maeola Harman, MD;  Location: [facility];  Service: Neurosurgery;  Laterality: N/A;  Baclofen pump placement  . PROGRAMABLE BACLOFEN PUMP REVISION  08/29/14  . right wrist surgery     CTR  . VASCULAR SURGERY     vericose vein in right femoral area  . VASECTOMY    . WISDOM TOOTH  EXTRACTION       Family History: Family History  Problem Relation Age of Onset  . Heart attack Mother 63  . Thyroid disease Mother   . Heart disease Mother   . Thyroid disease Sister   . Thyroid disease Brother   . Heart disease Maternal Grandmother   . Hypertension Maternal Grandmother   . Hyperlipidemia Maternal Grandmother   . Heart disease Maternal Grandfather   . Hypertension Maternal Grandfather     . Hyperlipidemia Maternal Grandfather   . Stroke Paternal Grandfather   . Thyroid disease Sister   . Heart disease Father        2 stents  . Coronary artery disease Unknown        questionable in father    Social History: Social History   Social History  . Marital status: Married    Spouse name: N/A  . Number of children: 3  . Years of education: N/A   Social History Main Topics  . Smoking status: Never Smoker  . Smokeless tobacco: Never Used  . Alcohol use Yes     Comment: socially  . Drug use: No  . 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 None   Social History Narrative  . None  He is an Acupuncturist  Allergies: No Known Allergies  Outpatient Meds: Current Outpatient Prescriptions  Medication Sig Dispense Refill  . AMPYRA 10 MG TB12 TAKE 1 TABLET BY MOUTH EVERY 12 HOURS 60 tablet 10  . aspirin EC 81 MG tablet Take 81 mg by mouth daily.    Marland Kitchen atorvastatin (LIPITOR) 40 MG tablet Take 1 tablet (40 mg total) by mouth daily at 6 PM. 90 tablet 3  . BACLOFEN IT by Intrathecal route.    . cetirizine (ZYRTEC) 10 MG tablet Take 10 mg by mouth daily as needed for allergies.     . clobetasol cream (TEMOVATE) 0.05 % Apply 1 application topically 2 (two) times daily as needed. 60 g 1  . clonazePAM (KLONOPIN) 1 MG tablet TAKE 1 TABLET BY MOUTH 4 TIMES A DAY 120 tablet 1  . Clonidine HCl POWD by Does not apply route. 720mg /ml IT Baclofen. Total dose 157.89mg /day. Last pump refill 06/02/16    . clotrimazole-betamethasone (LOTRISONE) cream Apply 1 application topically 2 (two) times daily as needed (anti-fungal). 30 g 2  . cyclobenzaprine (FLEXERIL) 10 MG tablet Take 1 tablet (10 mg total) by mouth 3 (three) times daily as needed for muscle spasms. 90 tablet 11  . DULoxetine (CYMBALTA) 60 MG capsule Take 1 capsule (60 mg total) by mouth 2 (two) times daily.  180 capsule 5  . gabapentin (NEURONTIN) 300 MG capsule One po in am, one po in evening and two po qHS 360 capsule 3  . ibuprofen (ADVIL,MOTRIN) 200 MG tablet Take 200 mg by mouth every 6 (six) hours as needed for moderate pain.    Marland Kitchen LORazepam (ATIVAN) 1 MG tablet 4 times a day as needed 120 tablet 3  . methylphenidate (RITALIN) 10 MG tablet Take one pill po up to four times a day 120 tablet 0  . natalizumab (TYSABRI) 300 MG/15ML injection Inject 300 mg into the vein every 30 (thirty) days.    . nitroGLYCERIN (NITROSTAT) 0.4 MG SL tablet PLACE 1 TABLET UNDER THE TOUNGE EVERY 5 MINUTES AS NEEDED FOR CHEST PAIN (X 3 DOSES) 25  tablet 0  . nystatin cream (MYCOSTATIN) Apply 1 application topically 2 (two) times daily. Apply 1 application topically as needed 120 g 2  . olmesartan (BENICAR) 20 MG tablet TAKE 1 TABLET (20 MG TOTAL) BY MOUTH DAILY. 90 tablet 2  . omeprazole (PRILOSEC OTC) 20 MG tablet Take 20 mg by mouth daily.    . ranitidine (ZANTAC) 300 MG tablet TAKE 1 TABLET BY MOUTH AT BEDTIME 30 tablet 3  . SYNTHROID 150 MCG tablet TAKE ONE TABLET BY MOUTH DAILY BEFORE BREAKFAST 30 tablet 6  . tamsulosin (FLOMAX) 0.4 MG CAPS capsule TAKE ONE CAPSULE (0.4 MG TOTAL) BY MOUTH DAILY. 90 capsule 3   No current facility-administered medications for this visit.       ___________________________________________________________________ Objective   Exam:  BP 128/80   Pulse 88   Ht 6\' 3"  (1.905 m)   Wt 283 lb (128.4 kg)   BMI 35.37 kg/m    General: this is a(n) Overweight and otherwise well-appearing man. Fluent speech. He gets on the exam table slowly but without assistance.   Eyes: sclera anicteric, no redness  ENT: oral mucosa moist without lesions, no cervical or supraclavicular lymphadenopathy, good dentition  CV: RRR without murmur, S1/S2, no JVD, no peripheral edema  Resp: clear to auscultation bilaterally, normal RR and effort noted  GI: soft, no tenderness, with active bowel  sounds. No guarding or palpable organomegaly noted.  Skin; warm and dry, no rash or jaundice noted  Neuro: Proximal muscle weakness, primarily in his legs. He wears bilateral lower leg supports and uses a walker as well.    Assessment: Encounter Diagnoses  Name Primary?  . Gastroesophageal reflux disease, esophagitis presence not specified Yes  . Atherosclerosis of native coronary artery of native heart without angina pectoris   . MS (multiple sclerosis) (HCC)     Symptoms of reflux that recur when he is off PPI. It sounds like there is some definite dietary triggers, and we reviewed the most common ones.  Plan:  EGD to screen for Barrett's esophagus and also determine if he has erosive versus nonerosive reflux. I think that will then help [place] determine what to do in the long run with his medicine. He is agreeable.  The benefits and risks of the planned procedure were described in detail with the patient or (when appropriate) their health care proxy.  Risks were outlined as including, but not limited to, bleeding, infection, perforation, adverse medication reaction leading to cardiac or pulmonary decompensation, or pancreatitis (if ERCP).  The limitation of incomplete mucosal visualization was also discussed.  No guarantees or warranties were given.   Nonpharmacologic antireflux measures were reenforced.  Continue current medication plan for now.  Thank you for the courtesy of this consult.  Please call me with any questions or concerns.  Charlie Pitter III  CC: [name], MD

## 2017-06-02 NOTE — Patient Instructions (Addendum)
You have been scheduled for an endoscopy. Please follow written instructions given to you at your visit today. If you use inhalers (even only as needed), please bring them with you on the day of your procedure. Your physician has requested that you go to www.startemmi.com and enter the access code given to you at your visit today. This web site gives a general overview about your procedure. However, you should still follow specific instructions given to you by our office regarding your preparation for the procedure.  If you are age 61 or older, your body mass index should be between 23-30. Your Body mass index is 35.37 kg/m. If this is out of the aforementioned range listed, please consider follow up with your Primary Care Provider.  If you are age 38 or younger, your body mass index should be between 19-25. Your Body mass index is 35.37 kg/m. If this is out of the aformentioned range listed, please consider follow up with your Primary Care Provider.    Thank you for choosing [facility].  Dr. Amada Jupiter

## 2017-06-11 ENCOUNTER — Encounter: Payer: Managed Care, Other (non HMO) | Admitting: Gastroenterology

## 2017-06-11 ENCOUNTER — Encounter: Payer: Self-pay | Admitting: Gastroenterology

## 2017-06-11 ENCOUNTER — Ambulatory Visit (AMBULATORY_SURGERY_CENTER): Payer: Managed Care, Other (non HMO) | Admitting: Gastroenterology

## 2017-06-11 VITALS — BP 113/67 | HR 69 | Temp 96.4°F | Resp 17 | Ht 75.0 in | Wt 283.0 lb

## 2017-06-11 DIAGNOSIS — K219 Gastro-esophageal reflux disease without esophagitis: Secondary | ICD-10-CM

## 2017-06-11 MED ORDER — SODIUM CHLORIDE 0.9 % IV SOLN
500.0000 mL | INTRAVENOUS | Status: DC
Start: 2017-06-11 — End: 2017-08-14

## 2017-06-11 NOTE — Progress Notes (Signed)
Dental advisory given to patientAlert and oriented x3, pleased with MAC, report to RN  

## 2017-06-11 NOTE — Op Note (Signed)
[facility] Patient Name: Keith Morrow Procedure Date: 06/11/2017 2:26 PM MRN: [id number] Endoscopist: Sherilyn Cooter L. Myrtie Neither , MD Age: 46 Referring MD:  Date of Birth: January 22, 1971 Gender: Male Account #: [id number] Procedure:                Upper GI endoscopy Indications:              Esophageal reflux symptoms that persist despite                            appropriate therapy Medicines:                Monitored Anesthesia Care Procedure:                Pre-Anesthesia Assessment:                           - Prior to the procedure, a History and Physical                            was performed, and patient medications and                            allergies were reviewed. The patient's tolerance of                            previous anesthesia was also reviewed. The risks                            and benefits of the procedure and the sedation                            options and risks were discussed with the patient.                            All questions were answered, and informed consent                            was obtained. Anticoagulants: The patient has taken                            aspirin. It was decided not to withhold this                            medication prior to the procedure. ASA Grade                            Assessment: III - A patient with severe systemic                            disease. After reviewing the risks and benefits,                            the patient was deemed in satisfactory condition to  undergo the procedure.                           After obtaining informed consent, the endoscope was                            passed under direct vision. Throughout the                            procedure, the patient's blood pressure, pulse, and                            oxygen saturations were monitored continuously. The                            Model GIF-HQ190 [id number]) scope was introduced                through the mouth, and advanced to the second part                            of duodenum. The upper GI endoscopy was                            accomplished without difficulty. The patient                            tolerated the procedure well. Scope In: Scope Out: Findings:                 The larynx was normal.                           A small hiatal hernia was present.                           The stomach was normal.                           The cardia and gastric fundus were normal on                            retroflexion.                           The examined duodenum was normal. Complications:            No immediate complications. Estimated Blood Loss:     Estimated blood loss: none. Impression:               - Normal larynx.                           - Small hiatal hernia.                           - Normal stomach.                           -  Normal examined duodenum.                           - No specimens collected. Recommendation:           - Patient has a contact number available for                            emergencies. The signs and symptoms of potential                            delayed complications were discussed with the                            patient. Return to normal activities tomorrow.                            Written discharge instructions were provided to the                            patient.                           - Resume previous diet.                           - Continue present medications.                           - Follow an antireflux regimen indefinitely. Maggie Senseney L. Myrtie Neither, MD 06/11/2017 2:42:36 PM This report has been signed electronically.

## 2017-06-11 NOTE — Patient Instructions (Signed)
YOU HAD AN ENDOSCOPIC PROCEDURE TODAY AT THE [facility]:   Refer to the procedure report that was given to you for any specific questions about what was found during the examination.  If the procedure report does not answer your questions, please call your gastroenterologist to clarify.  If you requested that your care partner not be given the details of your procedure findings, then the procedure report has been included in a sealed envelope for you to review at your convenience later.  YOU SHOULD EXPECT: Some feelings of bloating in the abdomen. Passage of more gas than usual.  Walking can help get rid of the air that was put into your GI tract during the procedure and reduce the bloating. If you had a lower endoscopy (such as a colonoscopy or flexible sigmoidoscopy) you may notice spotting of blood in your stool or on the toilet paper. If you underwent a bowel prep for your procedure, you may not have a normal bowel movement for a few days.  Please Note:  You might notice some irritation and congestion in your nose or some drainage.  This is from the oxygen used during your procedure.  There is no need for concern and it should clear up in a day or so.  SYMPTOMS TO REPORT IMMEDIATELY:    Following upper endoscopy (EGD)  Vomiting of blood or coffee ground material  New chest pain or pain under the shoulder blades  Painful or persistently difficult swallowing  New shortness of breath  Fever of 100F or higher  Black, tarry-looking stools  For urgent or emergent issues, a gastroenterologist can be reached at any hour by calling [phone].  Thank you for letting us take care of your healthcare needs today. Please see handout on Reflux regimen.   DIET:  We do recommend a small meal at first, but then you may proceed to your regular diet.  Drink plenty of fluids but you should avoid alcoholic beverages for 24 hours.  ACTIVITY:  You should plan to take it easy for the rest of  today and you should NOT DRIVE or use heavy machinery until tomorrow (because of the sedation medicines used during the test).    FOLLOW UP: Our staff will call the number listed on your records the next business day following your procedure to check on you and address any questions or concerns that you may have regarding the information given to you following your procedure. If we do not reach you, we will leave a message.  However, if you are feeling well and you are not experiencing any problems, there is no need to return our call.  We will assume that you have returned to your regular daily activities without incident.  If any biopsies were taken you will be contacted by phone or by letter within the next 1-3 weeks.  Please call us at [phone] if you have not heard about the biopsies in 3 weeks.    SIGNATURES/CONFIDENTIALITY: You and/or your care partner have signed paperwork which will be entered into your electronic medical record.  These signatures attest to the fact that that the information above on your After Visit Summary has been reviewed and is understood.  Full responsibility of the confidentiality of this discharge information lies with you and/or your care-partner.  Thank you for letting us take care of your healthcare needs today.

## 2017-06-12 ENCOUNTER — Telehealth: Payer: Self-pay | Admitting: *Deleted

## 2017-06-12 NOTE — Telephone Encounter (Signed)
  Follow up Call-  Call back number 06/11/2017  Post procedure Call Back phone  # [phone]  Permission to leave phone message Yes  Some recent data might be hidden     Patient questions:  Do you have a fever, pain , or abdominal swelling? No. Pain Score  0 *  Have you tolerated food without any problems? Yes.    Have you been able to return to your normal activities? Yes.    Do you have any questions about your discharge instructions: Diet   No. Medications  No. Follow up visit  No.  Do you have questions or concerns about your Care? No.  Actions: * If pain score is 4 or above: No action needed, pain <4.

## 2017-06-19 ENCOUNTER — Ambulatory Visit (INDEPENDENT_AMBULATORY_CARE_PROVIDER_SITE_OTHER): Payer: Managed Care, Other (non HMO) | Admitting: Psychology

## 2017-06-19 DIAGNOSIS — F4323 Adjustment disorder with mixed anxiety and depressed mood: Secondary | ICD-10-CM

## 2017-06-26 ENCOUNTER — Telehealth: Payer: Self-pay | Admitting: *Deleted

## 2017-06-26 MED ORDER — METHYLPHENIDATE HCL 10 MG PO TABS: ORAL_TABLET | ORAL | 0 refills | Status: DC

## 2017-06-26 NOTE — Telephone Encounter (Signed)
Ritalin rx. provided during infusion appt.  IT pump decreased by 5%, per pt. request and RAS ok, to see if this helps with periodic increased leg weakness/fim

## 2017-06-28 ENCOUNTER — Other Ambulatory Visit: Payer: Self-pay | Admitting: Family Medicine

## 2017-06-30 ENCOUNTER — Telehealth: Payer: Self-pay | Admitting: *Deleted

## 2017-06-30 NOTE — Telephone Encounter (Signed)
Received Physician Orders from [facility] for  PT Plan of Care, forwarded to covering provider [[place]]/SLS 07/24

## 2017-07-03 ENCOUNTER — Ambulatory Visit (INDEPENDENT_AMBULATORY_CARE_PROVIDER_SITE_OTHER): Payer: Managed Care, Other (non HMO) | Admitting: Psychology

## 2017-07-03 DIAGNOSIS — F4323 Adjustment disorder with mixed anxiety and depressed mood: Secondary | ICD-10-CM

## 2017-07-16 ENCOUNTER — Ambulatory Visit (INDEPENDENT_AMBULATORY_CARE_PROVIDER_SITE_OTHER): Payer: Managed Care, Other (non HMO) | Admitting: Psychology

## 2017-07-16 DIAGNOSIS — F4323 Adjustment disorder with mixed anxiety and depressed mood: Secondary | ICD-10-CM | POA: Diagnosis not present

## 2017-07-29 ENCOUNTER — Encounter: Payer: Self-pay | Admitting: *Deleted

## 2017-07-31 ENCOUNTER — Ambulatory Visit (INDEPENDENT_AMBULATORY_CARE_PROVIDER_SITE_OTHER): Payer: Managed Care, Other (non HMO) | Admitting: Psychology

## 2017-07-31 DIAGNOSIS — F4323 Adjustment disorder with mixed anxiety and depressed mood: Secondary | ICD-10-CM | POA: Diagnosis not present

## 2017-08-06 ENCOUNTER — Telehealth: Payer: Self-pay | Admitting: *Deleted

## 2017-08-06 NOTE — Telephone Encounter (Signed)
Received Physician Orders from [facility]; forwarded to provider/SLS 08/30

## 2017-08-14 ENCOUNTER — Ambulatory Visit (INDEPENDENT_AMBULATORY_CARE_PROVIDER_SITE_OTHER): Payer: Managed Care, Other (non HMO) | Admitting: Family Medicine

## 2017-08-14 ENCOUNTER — Ambulatory Visit (INDEPENDENT_AMBULATORY_CARE_PROVIDER_SITE_OTHER): Payer: Managed Care, Other (non HMO) | Admitting: Psychology

## 2017-08-14 ENCOUNTER — Encounter: Payer: Self-pay | Admitting: Family Medicine

## 2017-08-14 VITALS — BP 116/74 | HR 79 | Temp 98.6°F | Ht 75.0 in | Wt 277.4 lb

## 2017-08-14 DIAGNOSIS — E785 Hyperlipidemia, unspecified: Secondary | ICD-10-CM

## 2017-08-14 DIAGNOSIS — E039 Hypothyroidism, unspecified: Secondary | ICD-10-CM | POA: Diagnosis not present

## 2017-08-14 DIAGNOSIS — E6609 Other obesity due to excess calories: Secondary | ICD-10-CM

## 2017-08-14 DIAGNOSIS — E349 Endocrine disorder, unspecified: Secondary | ICD-10-CM

## 2017-08-14 DIAGNOSIS — I10 Essential (primary) hypertension: Secondary | ICD-10-CM

## 2017-08-14 DIAGNOSIS — F4323 Adjustment disorder with mixed anxiety and depressed mood: Secondary | ICD-10-CM

## 2017-08-14 DIAGNOSIS — Z23 Encounter for immunization: Secondary | ICD-10-CM

## 2017-08-14 DIAGNOSIS — Z7289 Other problems related to lifestyle: Secondary | ICD-10-CM

## 2017-08-14 LAB — CBC
HCT: 42.7 % (ref 39.0–52.0)
Hemoglobin: 14.2 g/dL (ref 13.0–17.0)
MCHC: 33.2 g/dL (ref 30.0–36.0)
MCV: 89.6 fl (ref 78.0–100.0)
Platelets: 200 10*3/uL (ref 150.0–400.0)
RBC: 4.77 Mil/uL (ref 4.22–5.81)
RDW: 14.1 % (ref 11.5–15.5)
WBC: 8 10*3/uL (ref 4.0–10.5)

## 2017-08-14 LAB — COMPREHENSIVE METABOLIC PANEL
ALT: 22 U/L (ref 0–53)
AST: 18 U/L (ref 0–37)
Albumin: 4.3 g/dL (ref 3.5–5.2)
Alkaline Phosphatase: 57 U/L (ref 39–117)
BUN: 14 mg/dL (ref 6–23)
CO2: 31 mEq/L (ref 19–32)
Calcium: 9.3 mg/dL (ref 8.4–10.5)
Chloride: 103 mEq/L (ref 96–112)
Creatinine, Ser: 0.89 mg/dL (ref 0.40–1.50)
GFR: 98.48 mL/min (ref >60.00)
Glucose, Bld: 93 mg/dL (ref 70–99)
Potassium: 4.1 mEq/L (ref 3.5–5.1)
Sodium: 140 mEq/L (ref 135–145)
Total Bilirubin: 0.6 mg/dL (ref 0.2–1.2)
Total Protein: 6.7 g/dL (ref 6.0–8.3)

## 2017-08-14 LAB — LIPID PANEL
Cholesterol: 135 mg/dL (ref 0–200)
HDL: 49.8 mg/dL (ref >39.00)
LDL Cholesterol: 76 mg/dL (ref 0–99)
NonHDL: 84.78
Total CHOL/HDL Ratio: 3
Triglycerides: 44 mg/dL (ref 0.0–149.0)
VLDL: 8.8 mg/dL (ref 0.0–40.0)

## 2017-08-14 LAB — TSH: TSH: 0.56 u[IU]/mL (ref 0.35–4.50)

## 2017-08-14 LAB — TESTOSTERONE: Testosterone: 288.08 ng/dL — ABNORMAL LOW (ref 300.00–890.00)

## 2017-08-14 NOTE — Assessment & Plan Note (Signed)
Encouraged DASH diet, decrease po intake and increase exercise as tolerated. Needs 7-8 hours of sleep nightly. Avoid trans fats, eat small, frequent meals every 4-5 hours with lean proteins, complex carbs and healthy fats. Minimize simple carbs 

## 2017-08-14 NOTE — Patient Instructions (Addendum)
Encouraged increased hydration and fiber in diet. Daily probiotics. If bowels not moving can use MOM 2 tbls po in 4 oz of warm prune juice by mouth every 2-3 days. If no results then repeat in 4 hours with  Dulcolax suppository pr, may repeat again in 4 more hours as needed. Seek care if symptoms worsen. Consider daily Miralax and/or Dulcolax if symptoms persist.   Try Miralax with Benefiber once to twice   Hiatal Hernia A hiatal hernia occurs when part of the stomach slides above the muscle that separates the abdomen from the chest (diaphragm). A person can be born with a hiatal hernia (congenital), or it may develop over time. In almost all cases of hiatal hernia, only the top part of the stomach pushes through the diaphragm. Many people have a hiatal hernia with no symptoms. The larger the hernia, the more likely it is that you will have symptoms. In some cases, a hiatal hernia allows stomach acid to flow back into the tube that carries food from your mouth to your stomach (esophagus). This may cause heartburn symptoms. Severe heartburn symptoms may mean that you have developed a condition called gastroesophageal reflux disease (GERD). What are the causes? This condition is caused by a weakness in the opening (hiatus) where the esophagus passes through the diaphragm to attach to the upper part of the stomach. A person may be born with a weakness in the hiatus, or a weakness can develop over time. What increases the risk? This condition is more likely to develop in:  Older people. Age is a major risk factor for a hiatal hernia, especially if you are over the age of 55.  Pregnant women.  People who are overweight.  People who have frequent constipation.  What are the signs or symptoms? Symptoms of this condition usually develop in the form of GERD symptoms. Symptoms include:  Heartburn.  Belching.  Indigestion.  Trouble swallowing.  Coughing or wheezing.  Sore  throat.  Hoarseness.  Chest pain.  Nausea and vomiting.  How is this diagnosed? This condition may be diagnosed during testing for GERD. Tests that may be done include:  X-rays of your stomach or chest.  An upper gastrointestinal (GI) series. This is an X-ray exam of your GI tract that is taken after you swallow a chalky liquid that shows up clearly on the X-ray.  Endoscopy. This is a procedure to look into your stomach using a thin, flexible tube that has a tiny camera and light on the end of it.  How is this treated? This condition may be treated by:  Dietary and lifestyle changes to help reduce GERD symptoms.  Medicines. These may include: ? Over-the-counter antacids. ? Medicines that make your stomach empty more quickly. ? Medicines that block the production of stomach acid (H2 blockers). ? Stronger medicines to reduce stomach acid (proton pump inhibitors).  Surgery to repair the hernia, if other treatments are not helping.  If you have no symptoms, you may not need treatment. Follow these instructions at home: Lifestyle and activity  Do not use any products that contain nicotine or tobacco, such as cigarettes and e-cigarettes. If you need help quitting, ask your health care provider.  Try to achieve and maintain a healthy body weight.  Avoid putting pressure on your abdomen. Anything that puts pressure on your abdomen increases the amount of acid that may be pushed up into your esophagus. ? Avoid bending over, especially after eating. ? Raise the head of your bed  by putting blocks under the legs. This keeps your head and esophagus higher than your stomach. ? Do not wear tight clothing around your chest or stomach. ? Try not to strain when having a bowel movement, when urinating, or when lifting heavy objects. Eating and drinking  Avoid foods that can worsen GERD symptoms. These may include: ? Fatty foods, like fried foods. ? Citrus fruits, like oranges or  lemon. ? Other foods and drinks that contain acid, like orange juice or tomatoes. ? Spicy food. ? Chocolate.  Eat frequent small meals instead of three large meals a day. This helps prevent your stomach from getting too full. ? Eat slowly. ? Do not lie down right after eating. ? Do not eat 1-2 hours before bed.  Do not drink beverages with caffeine. These include cola, coffee, cocoa, and tea.  Do not drink alcohol. General instructions  Take over-the-counter and prescription medicines only as told by your health care provider.  Keep all follow-up visits as told by your health care provider. This is important. Contact a health care provider if:  Your symptoms are not controlled with medicines or lifestyle changes.  You are having trouble swallowing.  You have coughing or wheezing that will not go away. Get help right away if:  Your pain is getting worse.  Your pain spreads to your arms, neck, jaw, teeth, or back.  You have shortness of breath.  You sweat for no reason.  You feel sick to your stomach (nauseous) or you vomit.  You vomit blood.  You have bright red blood in your stools.  You have black, tarry stools. This information is not intended to replace advice given to you by your health care provider. Make sure you discuss any questions you have with your health care provider. Document Released: 02/14/2004 Document Revised: 11/17/2016 Document Reviewed: 11/17/2016 Elsevier Interactive Patient Education  [employer].

## 2017-08-14 NOTE — Assessment & Plan Note (Signed)
Well controlled, no changes to meds. Encouraged heart healthy diet such as the DASH diet and exercise as tolerated.  °

## 2017-08-14 NOTE — Assessment & Plan Note (Signed)
On Levothyroxine, continue to monitor 

## 2017-08-14 NOTE — Assessment & Plan Note (Signed)
Check level today 

## 2017-08-14 NOTE — Assessment & Plan Note (Signed)
Tolerating statin, encouraged heart healthy diet, avoid trans fats, minimize simple carbs and saturated fats. Increase exercise as tolerated 

## 2017-08-15 ENCOUNTER — Other Ambulatory Visit: Payer: Self-pay | Admitting: Neurology

## 2017-08-15 ENCOUNTER — Other Ambulatory Visit: Payer: Self-pay | Admitting: Family Medicine

## 2017-08-15 LAB — HIV ANTIBODY (ROUTINE TESTING W REFLEX): HIV 1&2 Ab, 4th Generation: NONREACTIVE

## 2017-08-16 NOTE — Progress Notes (Signed)
Patient ID: YECHESKEL KUREK, male   DOB: [birth year], 46 y.o.   MRN: [id number]   Subjective:    Patient ID: [name], male    DOB: August 16, 1971, 46 y.o.   MRN: [id number]  Chief Complaint  Patient presents with  . Follow-up    Patient is here today for a 68-month-F/U. Forgot to get the Td last visit so he would like to get that and the Flu Vaccine today.  Would like to have labs drawn today and had a breakfast bar this am.  . Rash    Patient also mentions that he feels the yeast infection on his right buttock is back.  He has a refill of Nystatin Cream but would need to know how long he is to use it in order to make it go away.    HPI Patient is in today for follow up. He is currently working on a renovation on his house so he can stay home for as long as possible. No recent febrile illness or acute hospitalization. Is still in counseling and managing his frustration with his condition fairly well. Denies CP/palp/SOB/HA/congestion/fevers/GI or GU c/o. Taking meds as prescribed. He notes his candidal rash has recurred.   Past Medical History:  Diagnosis Date  . Acute bronchitis 05/09/2013  . Allergy   . Bronchitis, acute 12/10/2011  . Candidal skin infection 05/05/2017  . Chicken pox as a child  . Constipation 02/20/2017  . Coronary atherosclerosis of native coronary artery    a. NSTEMI 04/20/11: tx with Promus DES to LAD; bifurcation lesion with oDx 90% tx with POBA; b. staged PCI of right post. AV Branch with Promus DES; c. residual at cath 04/21/11:  AV groove CFX 50%, mRCA 30%,; EF 55%  . Depression   . Depression with anxiety   . Dermatitis, contact 12/10/2011  . Fatigue 10/15/2012  . Heart attack (HCC) 04-20-11  . HTN (hypertension)   . Hyperlipidemia   . Hypotestosteronism 11/17/2011  . Hypothyroidism   . Keloid scar   . MS (multiple sclerosis) (HCC)   . Neuromuscular disorder (HCC)    NUMBNESS/TINGLING  . Obesity   . Onychomycosis   . Preventative health care 10/07/2011  . Reflux  10/07/2011  . Sun-damaged skin 02/17/2016  . Testosterone deficiency 01/16/2012  . Tinea pedis   . 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 Visual changes 11/13/2016    Past Surgical History:  Procedure Laterality Date  . CORONARY ANGIOPLASTY WITH STENT PLACEMENT     2012  . PAIN PUMP IMPLANTATION N/A 08/29/2014   Procedure: Baclofen pump placement;  Surgeon: Maeola Harman, MD;  Location: [facility];  Service: Neurosurgery;  Laterality: N/A;  Baclofen pump placement  . PROGRAMABLE BACLOFEN PUMP REVISION  08/29/14  . right wrist surgery     CTR  . VASCULAR SURGERY     vericose vein in right femoral area  . VASECTOMY    . WISDOM TOOTH EXTRACTION      Family History  Problem Relation Age of Onset  . Heart attack Mother 7  . Thyroid disease Mother   . Heart disease Mother   . Thyroid disease Sister   . Thyroid disease Brother   . Heart disease Maternal Grandmother   . Hypertension Maternal Grandmother   . Hyperlipidemia Maternal Grandmother   . Heart disease Maternal Grandfather   . Hypertension Maternal Grandfather   . Hyperlipidemia Maternal Grandfather   . Stroke Paternal Grandfather   . Thyroid disease Sister   .  Heart disease Father        2 stents  . Coronary artery disease Unknown        questionable in father    Social History   Social History  . Marital status: Married    Spouse name: N/A  . Number of children: 3  . Years of education: N/A   Occupational History  . Not on file.   Social History Main Topics  . Smoking status: Never Smoker  . Smokeless tobacco: Never Used  . Alcohol use Yes     Comment: socially  . Drug use: No  . 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 Not on file   Social History Narrative  . No narrative on file    Outpatient Medications Prior to Visit  Medication Sig Dispense Refill  . AMPYRA 10 MG  TB12 TAKE 1 TABLET BY MOUTH EVERY 12 HOURS 60 tablet 10  . aspirin EC 81 MG tablet Take 81 mg by mouth daily.    Marland Kitchen atorvastatin (LIPITOR) 40 MG tablet Take 1 tablet (40 mg total) by mouth daily at 6 PM. 90 tablet 3  . BACLOFEN IT by Intrathecal route.    . cetirizine (ZYRTEC) 10 MG tablet Take 10 mg by mouth daily as needed for allergies.     . clobetasol cream (TEMOVATE) 0.05 % APPLY 1 APPLICATION TOPICALLY 2 (TWO) TIMES DAILY AS NEEDED. 60 g 1  . clonazePAM (KLONOPIN) 1 MG tablet TAKE 1 TABLET BY MOUTH 4 TIMES A DAY 120 tablet 1  . Clonidine HCl POWD by Does not apply route. /ml IT Baclofen. Total dose 157. /day. Last pump refill 06/02/16    . clotrimazole-betamethasone (LOTRISONE) cream Apply 1 application topically 2 (two) times daily as needed (anti-fungal). 30 g 2  . cyclobenzaprine (FLEXERIL) 10 MG tablet Take 1 tablet (10 mg total) by mouth 3 (three) times daily as needed for muscle spasms. 90 tablet 11  . DULoxetine (CYMBALTA) 60 MG capsule Take 1 capsule (60 mg total) by mouth 2 (two) times daily. 180 capsule 5  . gabapentin (NEURONTIN) 300 MG capsule One po in am, one po in evening and two po qHS 360 capsule 3  . ibuprofen (ADVIL,MOTRIN) 200 MG tablet Take 200 mg by mouth every 6 (six) hours as needed for moderate pain.    Marland Kitchen LORazepam (ATIVAN) 1 MG tablet 4 times a day as needed 120 tablet 3  . methylphenidate (RITALIN) 10 MG tablet Take one pill po up to four times a day 120 tablet 0  . natalizumab (TYSABRI) 300 MG/15ML injection Inject 300 mg into the vein every 30 (thirty) days.    . nitroGLYCERIN (NITROSTAT) 0.4 MG SL tablet PLACE 1 TABLET UNDER THE TOUNGE EVERY 5 MINUTES AS NEEDED FOR CHEST PAIN (X 3 DOSES) 25 tablet 0  . nystatin cream (MYCOSTATIN) Apply 1 application topically 2 (two) times daily. Apply 1 application topically as needed 120 g 2  . olmesartan (BENICAR) 20 MG tablet TAKE 1 TABLET (20 MG TOTAL) BY MOUTH DAILY. 90 tablet 2  . omeprazole (PRILOSEC OTC) 20 MG  tablet Take 20 mg by mouth daily.    . ranitidine (ZANTAC) 300 MG tablet TAKE 1 TABLET BY MOUTH AT BEDTIME 30 tablet 3  . SYNTHROID 150 MCG tablet TAKE ONE TABLET BY MOUTH DAILY BEFORE BREAKFAST 30 tablet 6  . tamsulosin (FLOMAX)  0.4 MG CAPS capsule TAKE ONE CAPSULE (0.4 MG TOTAL) BY MOUTH DAILY. 90 capsule 3  . 0.9 %  sodium chloride infusion      No facility-administered medications prior to visit.     No Known Allergies  Review of Systems  Constitutional: Positive for malaise/fatigue. Negative for fever.  HENT: Negative for congestion.   Eyes: Negative for blurred vision.  Respiratory: Negative for shortness of breath and wheezing.   Cardiovascular: Negative for chest pain, palpitations and leg swelling.  Gastrointestinal: Negative for abdominal pain, blood in stool and nausea.  Genitourinary: Negative for dysuria and frequency.  Musculoskeletal: Positive for falls.  Skin: Negative for rash.  Neurological: Positive for weakness. Negative for dizziness, loss of consciousness and headaches.  Endo/Heme/Allergies: Negative for environmental allergies.  Psychiatric/Behavioral: Positive for depression. The patient is not nervous/anxious.        Objective:    Physical Exam  Constitutional: He is oriented to person, place, and time. He appears well-developed and well-nourished. No distress.  HENT:  Head: Normocephalic and atraumatic.  Nose: Nose normal.  Eyes: Right eye exhibits no discharge. Left eye exhibits no discharge.  Neck: Normal range of motion. Neck supple.  Cardiovascular: Normal rate and regular rhythm.   No murmur heard. Pulmonary/Chest: Effort normal and breath sounds normal.  Abdominal: Soft. Bowel sounds are normal. There is no tenderness.  Musculoskeletal: He exhibits no edema.  Walking with walker.   Neurological: He is alert and oriented to person, place, and time.  Skin: Skin is warm and dry.  Psychiatric: He has a normal mood and affect.  Nursing note and  vitals reviewed.   BP 116/74 (BP Location: Left Arm, Patient Position: Sitting, Cuff Size: Normal)   Pulse 79   Temp 98.6 F (37 C) (Oral)   Ht 6\' 3"  (1.905 m)   Wt 277 lb 6.4 oz (125.8 kg)   SpO2 97%   BMI 34.67 kg/m  Wt Readings from Last 3 Encounters:  08/14/17 277 lb 6.4 oz (125.8 kg)  06/11/17 283 lb (128.4 kg)  06/02/17 283 lb (128.4 kg)     Lab Results  Component Value Date   WBC 8.0 08/14/2017   HGB 14.2 08/14/2017   HCT 42.7 08/14/2017   PLT 200.0 08/14/2017   GLUCOSE 93 08/14/2017   CHOL 135 08/14/2017   TRIG 44.0 08/14/2017   HDL 49.80 08/14/2017   LDLCALC 76 08/14/2017   ALT 22 08/14/2017   AST 18 08/14/2017   NA 140 08/14/2017   K 4.1 08/14/2017   CL 103 08/14/2017   CREATININE 0.89 08/14/2017   BUN 14 08/14/2017   CO2 31 08/14/2017   TSH 0.56 08/14/2017   PSA 0.52 02/11/2016   INR 1.00 04/21/2011    Lab Results  Component Value Date   TSH 0.56 08/14/2017   Lab Results  Component Value Date   WBC 8.0 08/14/2017   HGB 14.2 08/14/2017   HCT 42.7 08/14/2017   MCV 89.6 08/14/2017   PLT 200.0 08/14/2017   Lab Results  Component Value Date   NA 140 08/14/2017   K 4.1 08/14/2017   CO2 31 08/14/2017   GLUCOSE 93 08/14/2017   BUN 14 08/14/2017   CREATININE 0.89 08/14/2017   BILITOT 0.6 08/14/2017   ALKPHOS 57 08/14/2017   AST 18 08/14/2017   ALT 22 08/14/2017   PROT 6.7 08/14/2017   ALBUMIN 4.3 08/14/2017   CALCIUM 9.3 08/14/2017   ANIONGAP 10 12/07/2015   GFR 98.48 08/14/2017  Lab Results  Component Value Date   CHOL 135 08/14/2017   Lab Results  Component Value Date   HDL 49.80 08/14/2017   Lab Results  Component Value Date   LDLCALC 76 08/14/2017   Lab Results  Component Value Date   TRIG 44.0 08/14/2017   Lab Results  Component Value Date   CHOLHDL 3 08/14/2017   No results found for: HGBA1C     Assessment & Plan:   Problem List Items Addressed This Visit    Hyperlipidemia    Tolerating statin, encouraged  heart healthy diet, avoid trans fats, minimize simple carbs and saturated fats. Increase exercise as tolerated      Relevant Orders   Lipid panel (Completed)   Hypothyroidism    On Levothyroxine, continue to monitor      Relevant Orders   TSH (Completed)   HTN (hypertension)    Well controlled, no changes to meds. Encouraged heart healthy diet such as the DASH diet and exercise as tolerated.       Relevant Orders   CBC (Completed)   Comprehensive metabolic panel (Completed)   TSH (Completed)   Obesity    Encouraged DASH diet, decrease po intake and increase exercise as tolerated. Needs 7-8 hours of sleep nightly. Avoid trans fats, eat small, frequent meals every 4-5 hours with lean proteins, complex carbs and healthy fats. Minimize simple carbs.      Hypotestosteronism    Check level today      Relevant Orders   Testosterone (Completed)    Other Visit Diagnoses    Other problems related to lifestyle    -  Primary   Relevant Orders   HIV antibody (Completed)   Need for immunization against influenza       Relevant Orders   Flu Vaccine QUAD 36+ mos IM (Completed)   Need for Tdap vaccination       Relevant Orders   Tdap vaccine greater than or equal to 7yo IM (Completed)      I am having Mr. Saine maintain his cetirizine, aspirin EC, ibuprofen, BACLOFEN IT, LORazepam, natalizumab, DULoxetine, cyclobenzaprine, gabapentin, olmesartan, Clonidine HCl, atorvastatin, clotrimazole-betamethasone, tamsulosin, ranitidine, AMPYRA, nystatin cream, clonazePAM, nitroGLYCERIN, SYNTHROID, omeprazole, methylphenidate, and clobetasol cream. We will stop administering sodium chloride.  No orders of the defined types were placed in this encounter.    [name], MD

## 2017-08-24 ENCOUNTER — Ambulatory Visit (INDEPENDENT_AMBULATORY_CARE_PROVIDER_SITE_OTHER): Payer: Managed Care, Other (non HMO) | Admitting: Neurology

## 2017-08-24 ENCOUNTER — Encounter: Payer: Self-pay | Admitting: Neurology

## 2017-08-24 VITALS — BP 118/79 | HR 87 | Resp 18 | Ht 75.0 in | Wt 277.0 lb

## 2017-08-24 DIAGNOSIS — R3915 Urgency of urination: Secondary | ICD-10-CM | POA: Diagnosis not present

## 2017-08-24 DIAGNOSIS — G4700 Insomnia, unspecified: Secondary | ICD-10-CM | POA: Diagnosis not present

## 2017-08-24 DIAGNOSIS — R269 Unspecified abnormalities of gait and mobility: Secondary | ICD-10-CM

## 2017-08-24 DIAGNOSIS — R252 Cramp and spasm: Secondary | ICD-10-CM | POA: Diagnosis not present

## 2017-08-24 DIAGNOSIS — G35 Multiple sclerosis: Secondary | ICD-10-CM

## 2017-08-24 MED ORDER — METHYLPHENIDATE HCL 10 MG PO TABS: ORAL_TABLET | ORAL | 0 refills | Status: DC

## 2017-08-24 NOTE — Progress Notes (Signed)
Chief Complaint    Multiple Sclerosis      HISTORY:  Keith Morrow is a 46 year old man with a relapsing form of multiple sclerosis who has right greater than left leg weakness and progressive difficulties with gait function.  Due to severe spasticity, a baclofen pump was placed in the past.    MS:   He is currently on Tysabri. He tolerates it well and there has been no definite exacerbations.    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                       Gait:   He is able to use a Rollator walker around the house and at work much of the time. However, he requires the electric wheelchair at other times. He is doing physical therapy once a week.  He is unable to walk without the Rollator due to the weakness in his legs, right greater than left.   Spasticity:   Before the baclofen pump, he would get both tonic and phasic spasms in the legs that would often be uncomfortable.   His spasticity is improved with the combination of baclofen and clonidine in his pump.  He has not had any recent episodes of his legs locking up. His legs are easily bent. He occasionally will get a mild cramping sensation in the right thigh. This occurs if he sits a long time. It is not associated with actual spasticity worsening.   PHYSICAL EXAM  General: The patient is well-developed and well-nourished and in no acute distress.  Extremities:   No rash or edema.    Neurologic Exam  Mental status: The patient is alert and oriented x 3 at the time of the examination. Intact attention and recall.   Speech is normal.  CN:   Extraocular muscles are normal.  No ptosis.    Symmetric facial strength and sensation.    PE/TP midline  Motor: Muscle bulk is normal. Tone is borderline increased in right arm but not ncreased in legs. Strength is 4++/ 5 in the right arm, 5/5 in the left arm, 2+/5 in the right leg 3 to 4-/5 on the left.   Gait and station: He requires support to stand. He can use a Rollator walker. He has  an AFO on the right leg.  Reflexes: Deep tendon reflexes are trace at the knees.No ankleclonus   ASSESSMENT  MS (multiple sclerosis) (HCC)  Gait disturbance  Spasticity  Urinary urgency  Insomnia, unspecified type    PLAN: 1.   The pump will be refilled with 3600 mcg/mL baclofen and 720 mcg/mL clonidine. His spasticity is very well controlled but he has noted some more gradual worsening of his strength.   We will reduce the basal rate by 10% to 20.5 ucg/hr  and continue the 2 boluses (60 mcg at 11 pm and 60 mcg at 2 am) 2.   Will continue Tysabri every 4 weeks.  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  3.   Continue Ritalin and other med's 4.   A note was provided that it is medically necessary for him to have a van capable of transporting him in his wheelchair and that it is also medically necessary for alterations to his house to allow the wheelchair to pass through the doors and to have other aspects of the house more wheelchair accessible  5.   Return as scheduled  or sooner if there are new or worsening neurologic issues.  We can make further adjustments if needed.  Richard A. Epimenio Foot, MD, PhD Certified in Neurology, Clinical Neurophysiology, Sleep Medicine, Pain Medicine and Neuroimaging  [facility] [address] [place], [place] [address] [phone]

## 2017-08-28 ENCOUNTER — Ambulatory Visit (INDEPENDENT_AMBULATORY_CARE_PROVIDER_SITE_OTHER): Payer: Managed Care, Other (non HMO) | Admitting: Psychology

## 2017-08-28 DIAGNOSIS — F4323 Adjustment disorder with mixed anxiety and depressed mood: Secondary | ICD-10-CM

## 2017-08-31 ENCOUNTER — Ambulatory Visit: Payer: Managed Care, Other (non HMO) | Admitting: Neurology

## 2017-09-11 ENCOUNTER — Ambulatory Visit (INDEPENDENT_AMBULATORY_CARE_PROVIDER_SITE_OTHER): Payer: 59 | Admitting: Psychology

## 2017-09-11 DIAGNOSIS — F4323 Adjustment disorder with mixed anxiety and depressed mood: Secondary | ICD-10-CM

## 2017-09-21 ENCOUNTER — Encounter: Payer: Self-pay | Admitting: Family Medicine

## 2017-09-22 MED ORDER — CYMBALTA 60 MG PO CPEP: 60.0000 mg | ORAL_CAPSULE | Freq: Two times a day (BID) | ORAL | 0 refills | Status: DC

## 2017-09-23 ENCOUNTER — Telehealth: Payer: Self-pay

## 2017-09-23 NOTE — Telephone Encounter (Signed)
PA Case: [id number], Status: Approved, Coverage Starts on: 09/23/2017 12:00:00 AM, Coverage Ends on: 09/23/2018 12:00:00 AM.

## 2017-09-23 NOTE — Telephone Encounter (Signed)
PA initiated via Covermymeds; KEY: [id number]. Requested urgent turn around. Awaiting determination.

## 2017-09-25 ENCOUNTER — Ambulatory Visit (INDEPENDENT_AMBULATORY_CARE_PROVIDER_SITE_OTHER): Payer: 59 | Admitting: Psychology

## 2017-09-25 DIAGNOSIS — F4323 Adjustment disorder with mixed anxiety and depressed mood: Secondary | ICD-10-CM

## 2017-09-27 ENCOUNTER — Other Ambulatory Visit: Payer: Self-pay | Admitting: Family Medicine

## 2017-10-09 ENCOUNTER — Encounter: Payer: Self-pay | Admitting: Family Medicine

## 2017-10-09 ENCOUNTER — Ambulatory Visit (INDEPENDENT_AMBULATORY_CARE_PROVIDER_SITE_OTHER): Payer: 59 | Admitting: Psychology

## 2017-10-09 DIAGNOSIS — F4323 Adjustment disorder with mixed anxiety and depressed mood: Secondary | ICD-10-CM

## 2017-10-14 ENCOUNTER — Telehealth: Payer: Self-pay | Admitting: *Deleted

## 2017-10-14 NOTE — Telephone Encounter (Signed)
See Tiffany Loewe's chart: MRN: [id number]

## 2017-10-14 NOTE — Telephone Encounter (Signed)
Received Physician Orders/Plan of Care from [facility]; forwarded to provider/SLS 11/07

## 2017-10-23 ENCOUNTER — Ambulatory Visit (INDEPENDENT_AMBULATORY_CARE_PROVIDER_SITE_OTHER): Payer: 59 | Admitting: Psychology

## 2017-10-23 DIAGNOSIS — F4323 Adjustment disorder with mixed anxiety and depressed mood: Secondary | ICD-10-CM

## 2017-10-28 ENCOUNTER — Other Ambulatory Visit: Payer: Self-pay | Admitting: Family Medicine

## 2017-11-01 ENCOUNTER — Other Ambulatory Visit: Payer: Self-pay | Admitting: Family Medicine

## 2017-11-06 ENCOUNTER — Ambulatory Visit (INDEPENDENT_AMBULATORY_CARE_PROVIDER_SITE_OTHER): Payer: 59 | Admitting: Psychology

## 2017-11-06 DIAGNOSIS — F4323 Adjustment disorder with mixed anxiety and depressed mood: Secondary | ICD-10-CM

## 2017-11-09 ENCOUNTER — Encounter: Payer: Self-pay | Admitting: Family Medicine

## 2017-11-09 ENCOUNTER — Ambulatory Visit (INDEPENDENT_AMBULATORY_CARE_PROVIDER_SITE_OTHER): Payer: Managed Care, Other (non HMO) | Admitting: Family Medicine

## 2017-11-09 DIAGNOSIS — I10 Essential (primary) hypertension: Secondary | ICD-10-CM

## 2017-11-09 DIAGNOSIS — K5900 Constipation, unspecified: Secondary | ICD-10-CM

## 2017-11-09 DIAGNOSIS — F418 Other specified anxiety disorders: Secondary | ICD-10-CM | POA: Diagnosis not present

## 2017-11-09 DIAGNOSIS — G35 Multiple sclerosis: Secondary | ICD-10-CM

## 2017-11-09 DIAGNOSIS — E785 Hyperlipidemia, unspecified: Secondary | ICD-10-CM

## 2017-11-09 MED ORDER — OLMESARTAN MEDOXOMIL 20 MG PO TABS: 20.0000 mg | ORAL_TABLET | Freq: Every day | ORAL | 2 refills | Status: DC

## 2017-11-09 MED ORDER — RANITIDINE HCL 300 MG PO TABS: ORAL_TABLET | ORAL | 1 refills | Status: DC

## 2017-11-09 NOTE — Assessment & Plan Note (Signed)
Well controlled, no changes to meds. Encouraged heart healthy diet such as the DASH diet and exercise as tolerated.  °

## 2017-11-09 NOTE — Assessment & Plan Note (Signed)
Struggling with ongoing anxiety and depression but feels he is managing well all things considered and does not want to change meds.

## 2017-11-09 NOTE — Assessment & Plan Note (Signed)
Encouraged increased hydration and fiber in diet. Daily probiotics. If bowels not moving can use MOM 2 tbls po in 4 oz of warm prune juice by mouth every 2-3 days. If no results then repeat in 4 hours with  Dulcolax suppository pr, may repeat again in 4 more hours as needed. Seek care if symptoms worsen. Consider daily Miralax and/or Dulcolax if symptoms persist. Miralax and Benefiber daily to bid

## 2017-11-09 NOTE — Progress Notes (Signed)
Subjective:  I acted as a Neurosurgeon for [employer] Fuller Song, RMA   Patient ID: [name], male    DOB: 23-Feb-1971, 46 y.o.   MRN: [id number]  Chief Complaint  Patient presents with  . Follow-up    HPI  Patient is in today for follow up visit he continues to struggle with working full-time, raising children and his progressive MS.  They are in the process of trying to remodel her home to make it more handicap accessible and of course the construction is going slowly and not according to plan.  This is been very stressful and he has had episodes of anxiety attacks as a result.  No recent febrile illness or hospitalizations.  Continues to struggle with constipation and is not taking medications to manage it at this time has to strain to move his bowels every few days. Denies CP/palp/SOB/HA/congestion/fevers or GU c/o. Taking meds as prescribed  Patient Care Team: [name], MD as PCP - General (Family Medicine)   Past Medical History:  Diagnosis Date  . Acute bronchitis 05/09/2013  . Allergy   . Bronchitis, acute 12/10/2011  . Candidal skin infection 05/05/2017  . Chicken pox as a child  . Constipation 02/20/2017  . Coronary atherosclerosis of native coronary artery    a. NSTEMI 04/20/11: tx with Promus DES to LAD; bifurcation lesion with oDx 90% tx with POBA; b. staged PCI of right post. AV Branch with Promus DES; c. residual at cath 04/21/11:  AV groove CFX 50%, mRCA 30%,; EF 55%  . Depression   . Depression with anxiety   . Dermatitis, contact 12/10/2011  . Fatigue 10/15/2012  . Heart attack (HCC) 04-20-11  . HTN (hypertension)   . Hyperlipidemia   . Hypotestosteronism 11/17/2011  . Hypothyroidism   . Keloid scar   . MS (multiple sclerosis) (HCC)   . Neuromuscular disorder (HCC)    NUMBNESS/TINGLING  . Obesity   . Onychomycosis   . Preventative health care 10/07/2011  . Reflux 10/07/2011  . Sun-damaged skin 02/17/2016  . Testosterone deficiency 01/16/2012  . Tinea pedis   .  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 Visual changes 11/13/2016    Past Surgical History:  Procedure Laterality Date  . CORONARY ANGIOPLASTY WITH STENT PLACEMENT     2012  . PAIN PUMP IMPLANTATION N/A 08/29/2014   Procedure: Baclofen pump placement;  Surgeon: Maeola Harman, MD;  Location: [facility];  Service: Neurosurgery;  Laterality: N/A;  Baclofen pump placement  . PROGRAMABLE BACLOFEN PUMP REVISION  08/29/14  . right wrist surgery     CTR  . VASCULAR SURGERY     vericose vein in right femoral area  . VASECTOMY    . WISDOM TOOTH EXTRACTION      Family History  Problem Relation Age of Onset  . Heart attack Mother 11  . Thyroid disease Mother   . Heart disease Mother   . Thyroid disease Sister   . Thyroid disease Brother   . Heart disease Maternal Grandmother   . Hypertension Maternal Grandmother   . Hyperlipidemia Maternal Grandmother   . Heart disease Maternal Grandfather   . Hypertension Maternal Grandfather   . Hyperlipidemia Maternal Grandfather   . Stroke Paternal Grandfather   . Thyroid disease Sister   . Heart disease Father        2 stents  . Coronary artery disease Unknown        questionable in father    Social History  Socioeconomic History  . Marital status: Married    Spouse name: Not on file  . Number of children: 3  . Years of education: Not on file  . Highest education level: Not on file  Social Needs  . Financial resource strain: Not on file  . Food insecurity - worry: Not on file  . Food insecurity - inability: Not on file  . Transportation needs - medical: Not on file  . Transportation needs - non-medical: Not on file  Occupational History  . Not on file  Tobacco Use  . Smoking status: Never Smoker  . Smokeless tobacco: Never Used  Substance and Sexual Activity  . Alcohol use: Yes    Comment: socially  . Drug use: No  . 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 Not on file  Social History Narrative  . Not on file    Outpatient Medications Prior to Visit  Medication Sig Dispense Refill  . AMPYRA 10 MG TB12 TAKE 1 TABLET BY MOUTH EVERY 12 HOURS 60 tablet 10  . aspirin EC 81 MG tablet Take 81 mg by mouth daily.    Marland Kitchen atorvastatin (LIPITOR) 40 MG tablet Take 1 tablet (40 mg total) by mouth daily at 6 PM. 90 tablet 3  . BACLOFEN IT by Intrathecal route.    . cetirizine (ZYRTEC) 10 MG tablet Take 10 mg by mouth daily as needed for allergies.     . clobetasol cream (TEMOVATE) 0.05 % APPLY 1 APPLICATION TOPICALLY 2 (TWO) TIMES DAILY AS NEEDED. 60 g 1  . clonazePAM (KLONOPIN) 1 MG tablet TAKE 1 TABLET BY MOUTH 4 TIMES A DAY 120 tablet 1  . Clonidine HCl POWD by Does not apply route. 720mg /ml IT Baclofen. Total dose 157.89mg /day. Last pump refill 06/02/16    . clotrimazole-betamethasone (LOTRISONE) cream Apply 1 application topically 2 (two) times daily as needed (anti-fungal). 30 g 2  . cyclobenzaprine (FLEXERIL) 10 MG tablet Take 1 tablet (10 mg total) by mouth 3 (three) times daily as needed for muscle spasms. 90 tablet 11  . CYMBALTA 60 MG capsule Take 1 capsule (60 mg total) by mouth 2 (two) times daily. 180 capsule 0  . gabapentin (NEURONTIN) 300 MG capsule One po in am, one po in evening and two po qHS 360 capsule 3  . ibuprofen (ADVIL,MOTRIN) 200 MG tablet Take 200 mg by mouth every 6 (six) hours as needed for moderate pain.    . methylphenidate (RITALIN) 10 MG tablet Take one pill po up to four times a day 120 tablet 0  . natalizumab (TYSABRI) 300 MG/15ML injection Inject 300 mg into the vein every 30 (thirty) days.    . nitroGLYCERIN (NITROSTAT) 0.4 MG SL tablet PLACE 1 TABLET UNDER THE TOUNGE EVERY 5 MINUTES AS NEEDED FOR CHEST PAIN (X 3 DOSES) 25 tablet 0  . nystatin cream (MYCOSTATIN) Apply 1 application topically 2 (two) times daily. Apply 1 application topically as needed 120 g 2  .  omeprazole (PRILOSEC OTC) 20 MG tablet Take 20 mg by mouth daily.    Marland Kitchen SYNTHROID 150 MCG tablet TAKE ONE TABLET BY MOUTH DAILY BEFORE BREAKFAST 30 tablet 6  . tamsulosin (FLOMAX) 0.4 MG CAPS capsule TAKE ONE CAPSULE (0.4 MG TOTAL) BY MOUTH DAILY. 90 capsule 3  . LORazepam (ATIVAN) 1 MG tablet 4 times a day as needed 120 tablet 3  .  olmesartan (BENICAR) 20 MG tablet TAKE 1 TABLET (20 MG TOTAL) BY MOUTH DAILY. 90 tablet 2  . ranitidine (ZANTAC) 300 MG tablet TAKE 1 TABLET BY MOUTH EVERYDAY AT BEDTIME 30 tablet 0   No facility-administered medications prior to visit.     No Known Allergies  Review of Systems  Constitutional: Negative for fever and malaise/fatigue.  HENT: Negative for congestion.   Eyes: Negative for blurred vision.  Respiratory: Negative for shortness of breath.   Cardiovascular: Negative for chest pain, palpitations and leg swelling.  Gastrointestinal: Negative for abdominal pain, blood in stool and nausea.  Genitourinary: Negative for dysuria and frequency.  Musculoskeletal: Negative for falls.  Skin: Negative for rash.  Neurological: Negative for dizziness, loss of consciousness and headaches.  Endo/Heme/Allergies: Negative for environmental allergies.  Psychiatric/Behavioral: Negative for depression. The patient is not nervous/anxious.        Objective:    Physical Exam  Constitutional: He is oriented to person, place, and time. He appears well-developed and well-nourished. No distress.  HENT:  Head: Normocephalic and atraumatic.  Nose: Nose normal.  Eyes: Right eye exhibits no discharge. Left eye exhibits no discharge.  Neck: Normal range of motion. Neck supple.  Cardiovascular: Normal rate and regular rhythm.  No murmur heard. Pulmonary/Chest: Effort normal and breath sounds normal.  Abdominal: Soft. Bowel sounds are normal. There is no tenderness.  Musculoskeletal: He exhibits no edema.  Neurological: He is alert and oriented to person, place, and time.  Coordination abnormal.  Skin: Skin is warm and dry.  Psychiatric: He has a normal mood and affect.  Nursing note and vitals reviewed.   BP 118/65 (BP Location: Left Arm, Patient Position: Sitting, Cuff Size: Large)   Pulse 95   Temp 98.2 F (36.8 C) (Oral)   Resp 16   Ht 6' 3.2" (1.91 m)   Wt 281 lb (127.5 kg)   SpO2 98%   BMI 34.94 kg/m  Wt Readings from Last 3 Encounters:  11/09/17 281 lb (127.5 kg)  08/24/17 277 lb (125.6 kg)  08/14/17 277 lb 6.4 oz (125.8 kg)   BP Readings from Last 3 Encounters:  11/09/17 118/65  08/24/17 118/79  08/14/17 116/74     Immunization History  Administered Date(s) Administered  . Influenza Split 10/06/2011, 10/15/2012  . Influenza,inj,Quad PF,6+ Mos 11/01/2013, 01/05/2015, 09/03/2015, 11/13/2016, 08/14/2017  . Tdap 12/08/2006, 08/14/2017    Health Maintenance  Topic Date Due  . TETANUS/TDAP  08/15/2027  . INFLUENZA VACCINE  Completed  . HIV Screening  Completed    Lab Results  Component Value Date   WBC 8.0 08/14/2017   HGB 14.2 08/14/2017   HCT 42.7 08/14/2017   PLT 200.0 08/14/2017   GLUCOSE 93 08/14/2017   CHOL 135 08/14/2017   TRIG 44.0 08/14/2017   HDL 49.80 08/14/2017   LDLCALC 76 08/14/2017   ALT 22 08/14/2017   AST 18 08/14/2017   NA 140 08/14/2017   K 4.1 08/14/2017   CL 103 08/14/2017   CREATININE 0.89 08/14/2017   BUN 14 08/14/2017   CO2 31 08/14/2017   TSH 0.56 08/14/2017   PSA 0.52 02/11/2016   INR 1.00 04/21/2011    Lab Results  Component Value Date   TSH 0.56 08/14/2017   Lab Results  Component Value Date   WBC 8.0 08/14/2017   HGB 14.2 08/14/2017   HCT 42.7 08/14/2017   MCV 89.6 08/14/2017   PLT 200.0 08/14/2017   Lab Results  Component Value Date   NA 140  08/14/2017   K 4.1 08/14/2017   CO2 31 08/14/2017   GLUCOSE 93 08/14/2017   BUN 14 08/14/2017   CREATININE 0.89 08/14/2017   BILITOT 0.6 08/14/2017   ALKPHOS 57 08/14/2017   AST 18 08/14/2017   ALT 22 08/14/2017   PROT 6.7  08/14/2017   ALBUMIN 4.3 08/14/2017   CALCIUM 9.3 08/14/2017   ANIONGAP 10 12/07/2015   GFR 98.48 08/14/2017   Lab Results  Component Value Date   CHOL 135 08/14/2017   Lab Results  Component Value Date   HDL 49.80 08/14/2017   Lab Results  Component Value Date   LDLCALC 76 08/14/2017   Lab Results  Component Value Date   TRIG 44.0 08/14/2017   Lab Results  Component Value Date   CHOLHDL 3 08/14/2017   No results found for: HGBA1C       Assessment & Plan:   Problem List Items Addressed This Visit    Multiple sclerosis (HCC)    He continues to work closely with neurology      Hyperlipidemia    Encouraged heart healthy diet, increase exercise, avoid trans fats, consider a krill oil cap daily      Relevant Medications   olmesartan (BENICAR) 20 MG tablet   HTN (hypertension)    Well controlled, no changes to meds. Encouraged heart healthy diet such as the DASH diet and exercise as tolerated.       Relevant Medications   olmesartan (BENICAR) 20 MG tablet   Depression with anxiety    Struggling with ongoing anxiety and depression but feels he is managing well all things considered and does not want to change meds.       Constipation    Encouraged increased hydration and fiber in diet. Daily probiotics. If bowels not moving can use MOM 2 tbls po in 4 oz of warm prune juice by mouth every 2-3 days. If no results then repeat in 4 hours with  Dulcolax suppository pr, may repeat again in 4 more hours as needed. Seek care if symptoms worsen. Consider daily Miralax and/or Dulcolax if symptoms persist. Miralax and Benefiber daily to bid         I have discontinued Teena Iraniavid M. Natzke's LORazepam. I am also having him maintain his cetirizine, aspirin EC, ibuprofen, BACLOFEN IT, natalizumab, cyclobenzaprine, gabapentin, Clonidine HCl, atorvastatin, clotrimazole-betamethasone, tamsulosin, AMPYRA, nystatin cream, nitroGLYCERIN, omeprazole, clobetasol cream, clonazePAM,  methylphenidate, CYMBALTA, SYNTHROID, olmesartan, and ranitidine.  Meds ordered this encounter  Medications  . olmesartan (BENICAR) 20 MG tablet    Sig: Take 1 tablet (20 mg total) by mouth daily.    Dispense:  90 tablet    Refill:  2  . ranitidine (ZANTAC) 300 MG tablet    Sig: TAKE 1 TABLET BY MOUTH EVERYDAY AT BEDTIME    Dispense:  90 tablet    Refill:  1    CMA served as scribe during this visit. History, Physical and Plan performed by medical provider. Documentation and orders reviewed and attested to.  Danise EdgeStacey Blyth, MD

## 2017-11-09 NOTE — Assessment & Plan Note (Signed)
Encouraged heart healthy diet, increase exercise, avoid trans fats, consider a krill oil cap daily 

## 2017-11-09 NOTE — Assessment & Plan Note (Signed)
He continues to work closely with neurology

## 2017-11-09 NOTE — Patient Instructions (Addendum)
Encouraged increased hydration and fiber in diet. Daily probiotics. If bowels not moving can use MOM 2 tbls po in 4 oz of warm prune juice by mouth every 2-3 days. If no results then repeat in 4 hours with  Dulcolax suppository pr, may repeat again in 4 more hours as needed. Seek care if symptoms worsen. Consider daily Miralax and/or Dulcolax if symptoms persist.   Miralax with Benefiber once or twice daily with good hydration with 64 oz Desitin Zinc  Hypertension Hypertension is another name for high blood pressure. High blood pressure forces your heart to work harder to pump blood. This can cause problems over time. There are two numbers in a blood pressure reading. There is a top number (systolic) over a bottom number (diastolic). It is best to have a blood pressure below 120/80. Healthy choices can help lower your blood pressure. You may need medicine to help lower your blood pressure if:  Your blood pressure cannot be lowered with healthy choices.  Your blood pressure is higher than 130/80.  Follow these instructions at home: Eating and drinking  If directed, follow the DASH eating plan. This diet includes: ? Filling half of your plate at each meal with fruits and vegetables. ? Filling one quarter of your plate at each meal with whole grains. Whole grains include whole wheat pasta, brown rice, and whole grain bread. ? Eating or drinking low-fat dairy products, such as skim milk or low-fat yogurt. ? Filling one quarter of your plate at each meal with low-fat (lean) proteins. Low-fat proteins include fish, skinless chicken, eggs, beans, and tofu. ? Avoiding fatty meat, cured and processed meat, or chicken with skin. ? Avoiding premade or processed food.  Eat less than 1,500 mg of salt (sodium) a day.  Limit alcohol use to no more than 1 drink a day for nonpregnant women and 2 drinks a day for men. One drink equals 12 oz of beer, 5 oz of wine, or 1 oz of hard liquor. Lifestyle  Work  with your doctor to stay at a healthy weight or to lose weight. Ask your doctor what the best weight is for you.  Get at least 30 minutes of exercise that causes your heart to beat faster (aerobic exercise) most days of the week. This may include walking, swimming, or biking.  Get at least 30 minutes of exercise that strengthens your muscles (resistance exercise) at least 3 days a week. This may include lifting weights or pilates.  Do not use any products that contain nicotine or tobacco. This includes cigarettes and e-cigarettes. If you need help quitting, ask your doctor.  Check your blood pressure at home as told by your doctor.  Keep all follow-up visits as told by your doctor. This is important. Medicines  Take over-the-counter and prescription medicines only as told by your doctor. Follow directions carefully.  Do not skip doses of blood pressure medicine. The medicine does not work as well if you skip doses. Skipping doses also puts you at risk for problems.  Ask your doctor about side effects or reactions to medicines that you should watch for. Contact a doctor if:  You think you are having a reaction to the medicine you are taking.  You have headaches that keep coming back (recurring).  You feel dizzy.  You have swelling in your ankles.  You have trouble with your vision. Get help right away if:  You get a very bad headache.  You start to feel confused.  You  feel weak or numb.  You feel faint.  You get very bad pain in your: ? Chest. ? Belly (abdomen).  You throw up (vomit) more than once.  You have trouble breathing. Summary  Hypertension is another name for high blood pressure.  Making healthy choices can help lower blood pressure. If your blood pressure cannot be controlled with healthy choices, you may need to take medicine. This information is not intended to replace advice given to you by your health care provider. Make sure you discuss any questions  you have with your health care provider. Document Released: 05/12/2008 Document Revised: 10/22/2016 Document Reviewed: 10/22/2016 Elsevier Interactive Patient Education  [employer].

## 2017-11-14 ENCOUNTER — Other Ambulatory Visit: Payer: Self-pay | Admitting: Neurology

## 2017-11-20 ENCOUNTER — Telehealth: Payer: Self-pay

## 2017-11-20 ENCOUNTER — Ambulatory Visit (INDEPENDENT_AMBULATORY_CARE_PROVIDER_SITE_OTHER): Payer: 59 | Admitting: Psychology

## 2017-11-20 DIAGNOSIS — F4323 Adjustment disorder with mixed anxiety and depressed mood: Secondary | ICD-10-CM | POA: Diagnosis not present

## 2017-11-20 NOTE — Telephone Encounter (Signed)
PT orders from [facility] received, forms signed and faxed to [phone].

## 2017-11-23 ENCOUNTER — Encounter: Payer: Self-pay | Admitting: Family Medicine

## 2017-11-23 MED ORDER — CYMBALTA 60 MG PO CPEP: 60.0000 mg | ORAL_CAPSULE | Freq: Two times a day (BID) | ORAL | 1 refills | Status: DC

## 2017-11-24 ENCOUNTER — Encounter: Payer: Self-pay | Admitting: Neurology

## 2017-11-24 MED ORDER — CLONAZEPAM 1 MG PO TABS: 1.0000 mg | ORAL_TABLET | Freq: Four times a day (QID) | ORAL | 1 refills | Status: DC

## 2017-11-27 ENCOUNTER — Encounter: Payer: Self-pay | Admitting: Neurology

## 2017-12-02 ENCOUNTER — Telehealth: Payer: Self-pay | Admitting: *Deleted

## 2017-12-02 NOTE — Telephone Encounter (Signed)
Called Keith Morrow - he is aware that Dr. Epimenio FootSater is out of the office until 12/09/17.  He will be getting his Tysabri infusion in our office on 12/11/17 and would like to pick up the letter that day, if Dr. Epimenio FootSater feels that continuing PT is medically necessary.  Keith Morrow would like to continue PT and feels it is beneficial.   Received email below:  I just found out, that starting Jan 1st, I have to have proof of "medical necessity" for my insurance to keep paying my PT visits. Is this something you can help with?    Thanks,  Keith Morrow

## 2017-12-09 ENCOUNTER — Encounter: Payer: Self-pay | Admitting: *Deleted

## 2017-12-09 NOTE — Telephone Encounter (Signed)
Letter, last ov notes up front [facility] for pt. to pick up/fim

## 2017-12-10 ENCOUNTER — Telehealth: Payer: Self-pay | Admitting: *Deleted

## 2017-12-10 NOTE — Telephone Encounter (Signed)
Received Physician OrdersPlan of Care from [facility]; forwarded to provider/SLS 01/03

## 2017-12-14 ENCOUNTER — Encounter: Payer: Self-pay | Admitting: Neurology

## 2017-12-15 ENCOUNTER — Telehealth: Payer: Self-pay | Admitting: *Deleted

## 2017-12-15 NOTE — Telephone Encounter (Signed)
Received Home Health Certification and Plan of Care; forwarded to provider/SLS 01/08  

## 2017-12-18 ENCOUNTER — Ambulatory Visit (INDEPENDENT_AMBULATORY_CARE_PROVIDER_SITE_OTHER): Payer: 59 | Admitting: Psychology

## 2017-12-18 DIAGNOSIS — F4323 Adjustment disorder with mixed anxiety and depressed mood: Secondary | ICD-10-CM | POA: Diagnosis not present

## 2017-12-22 ENCOUNTER — Telehealth: Payer: Self-pay | Admitting: *Deleted

## 2017-12-22 MED ORDER — GABAPENTIN 300 MG PO CAPS: ORAL_CAPSULE | ORAL | 3 refills | Status: DC

## 2017-12-22 NOTE — Telephone Encounter (Signed)
Gabapentin escribed to [facility] per faxed request from them/fim

## 2018-01-01 ENCOUNTER — Ambulatory Visit (INDEPENDENT_AMBULATORY_CARE_PROVIDER_SITE_OTHER): Payer: 59 | Admitting: Psychology

## 2018-01-01 DIAGNOSIS — F4323 Adjustment disorder with mixed anxiety and depressed mood: Secondary | ICD-10-CM | POA: Diagnosis not present

## 2018-01-07 ENCOUNTER — Telehealth: Payer: Self-pay | Admitting: *Deleted

## 2018-01-07 NOTE — Telephone Encounter (Signed)
Received Plan of Care; forwarded to provider/SLS 01/31

## 2018-01-08 ENCOUNTER — Telehealth: Payer: Self-pay | Admitting: *Deleted

## 2018-01-08 NOTE — Telephone Encounter (Signed)
Received Rehab Plan of Care; forwarded to provider/SLS 02/01

## 2018-01-13 NOTE — Telephone Encounter (Signed)
Plan of Care signed and faxed to [facility] at [phone]. Form sent for scanning.

## 2018-01-15 ENCOUNTER — Ambulatory Visit (INDEPENDENT_AMBULATORY_CARE_PROVIDER_SITE_OTHER): Payer: 59 | Admitting: Psychology

## 2018-01-15 DIAGNOSIS — F4323 Adjustment disorder with mixed anxiety and depressed mood: Secondary | ICD-10-CM

## 2018-01-19 ENCOUNTER — Encounter: Payer: Self-pay | Admitting: *Deleted

## 2018-01-24 ENCOUNTER — Encounter: Payer: Self-pay | Admitting: Family Medicine

## 2018-01-25 ENCOUNTER — Other Ambulatory Visit: Payer: Self-pay | Admitting: Family Medicine

## 2018-01-25 MED ORDER — OSELTAMIVIR PHOSPHATE 75 MG PO CAPS
75.0000 mg | ORAL_CAPSULE | Freq: Every day | ORAL | 0 refills | Status: DC
Start: 2018-01-25 — End: 2018-08-23

## 2018-01-29 ENCOUNTER — Ambulatory Visit (INDEPENDENT_AMBULATORY_CARE_PROVIDER_SITE_OTHER): Payer: 59 | Admitting: Psychology

## 2018-01-29 DIAGNOSIS — F4323 Adjustment disorder with mixed anxiety and depressed mood: Secondary | ICD-10-CM

## 2018-02-04 ENCOUNTER — Telehealth: Payer: Self-pay | Admitting: *Deleted

## 2018-02-04 NOTE — Telephone Encounter (Signed)
Received [facility] PT Plan of Care; forwarded to provider/SLS 02/28

## 2018-02-12 ENCOUNTER — Ambulatory Visit (INDEPENDENT_AMBULATORY_CARE_PROVIDER_SITE_OTHER): Payer: 59 | Admitting: Psychology

## 2018-02-12 DIAGNOSIS — F4323 Adjustment disorder with mixed anxiety and depressed mood: Secondary | ICD-10-CM

## 2018-02-13 ENCOUNTER — Other Ambulatory Visit: Payer: Self-pay | Admitting: Cardiovascular Disease

## 2018-02-15 ENCOUNTER — Ambulatory Visit (INDEPENDENT_AMBULATORY_CARE_PROVIDER_SITE_OTHER): Payer: Managed Care, Other (non HMO) | Admitting: Neurology

## 2018-02-15 ENCOUNTER — Other Ambulatory Visit: Payer: Self-pay

## 2018-02-15 ENCOUNTER — Encounter: Payer: Self-pay | Admitting: Neurology

## 2018-02-15 VITALS — BP 136/80 | HR 86 | Resp 20 | Ht 75.2 in | Wt 284.0 lb

## 2018-02-15 DIAGNOSIS — R208 Other disturbances of skin sensation: Secondary | ICD-10-CM | POA: Diagnosis not present

## 2018-02-15 DIAGNOSIS — G35 Multiple sclerosis: Secondary | ICD-10-CM | POA: Diagnosis not present

## 2018-02-15 DIAGNOSIS — R252 Cramp and spasm: Secondary | ICD-10-CM | POA: Diagnosis not present

## 2018-02-15 DIAGNOSIS — R269 Unspecified abnormalities of gait and mobility: Secondary | ICD-10-CM

## 2018-02-15 DIAGNOSIS — R3915 Urgency of urination: Secondary | ICD-10-CM | POA: Diagnosis not present

## 2018-02-15 NOTE — Progress Notes (Signed)
HISTORY:  Keith Morrow is a 47 year old man with a relapsing form of multiple sclerosis who has right greater than left leg weakness and progressive difficulties with gait function.  Due to severe spasticity, a baclofen pump was placed in 2013.  Update 02/15/2018:    He feels that his MS is stable and he has no further exacerbations.  He is on Tysabri.   He tolerates it well. We need to recheck the JCV Ab. It has been negative so far. He feels his gait is doing about the same. With the walker he is able to get between his core in the office Around the house he usually does not have much trouble.   The baclofen pump has helped. He continues on a combination of  Baclofen and clonidine. He does note that he has more spasticity in the late evening and when he lays down at night.    Currently, he has a basal rate and two boluses at 11 PM and at 2 AM.   His other MS symptoms are doing about the same   08/24/2017: MS:   He is currently on Tysabri. He tolerates it well and there has been no definite exacerbations.    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                       Gait:   He is able to use a Rollator walker around the house and at work much of the time. However, he requires the electric wheelchair at other times. He is doing physical therapy once a week.  He is unable to walk without the Rollator due to the weakness in his legs, right greater than left.   Spasticity:   Before the baclofen pump, he would get both tonic and phasic spasms in the legs that would often be uncomfortable.   His spasticity is improved with the combination of baclofen and clonidine in his pump.  He has not had any recent episodes of his legs locking up. His legs are easily bent. He occasionally will get a mild cramping sensation in the right thigh. This occurs if he sits a long time. It is not associated with actual spasticity worsening.   PHYSICAL EXAM  General: The patient is well-developed and well-nourished and  in no acute distress.  Extremities:   No rash or edema.    Neurologic Exam  Mental status: The patient is alert and oriented x 3 at the time of the examination. Intact attention and recall.   Speech is normal.  CN:   Extraocular muscles are normal.  No ptosis.    Symmetric facial strength and sensation.    PE/TP midline  Motor: Muscle bulk is normal.tone is mildly increased in the right arm and minimally increased in the legs. Strength is 4++/ 5 in the right arm, 5/5 in the left arm, 2+/5 in the right leg 3 to 4-/5 on the left.   Gait and station: He requires support to stand. He can use a Rollator walker. He has an AFO on the right leg.  Reflexes: Deep tendon reflexes are trace at the knees.No ankleclonus   ASSESSMENT  Multiple sclerosis (HCC) - Plan: Stratify JCV Antibody Test ([facility]), CBC with Differential/Platelet  Gait disturbance  Spasticity  Urinary urgency  Dysesthesia    PLAN: 1.   Refill the intrathecal pump with 3600 mcg/mL baclofen and 720 mcg/mL clonidine. His spasticity is fairly well controlled, though he has  more issues around bedtime (930 pm).  Continue the basal rate of 20.5 ucg/hr and continue the 2 boluses (60 mcg at 11 pm and 60 mcg at 2 am).  Add another bolus of 30 mcg at 6 pm 2.   Will continue Tysabri every 4 weeks.  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  3.   Continue other med's  4.   Return as scheduled or sooner if there are new or worsening neurologic issues.  We can make further adjustments if needed.  Maddalynn Barnard A. Epimenio Foot, MD, PhD, FAAN Certified in Neurology, Clinical Neurophysiology, Sleep Medicine, Pain Medicine and Neuroimaging Director, [facility] at [facility]  [facility] [address] [place], [place] [address] [phone]

## 2018-02-16 LAB — CBC WITH DIFFERENTIAL/PLATELET
Basophils Absolute: 0.1 10*3/uL (ref 0.0–0.2)
Basos: 1 %
EOS (ABSOLUTE): 0.5 10*3/uL — ABNORMAL HIGH (ref 0.0–0.4)
Eos: 7 %
Hematocrit: 40.6 % (ref 37.5–51.0)
Hemoglobin: 13.4 g/dL (ref 13.0–17.7)
Immature Grans (Abs): 0 10*3/uL (ref 0.0–0.1)
Immature Granulocytes: 1 %
Lymphocytes Absolute: 2.4 10*3/uL (ref 0.7–3.1)
Lymphs: 33 %
MCH: 29.3 pg (ref 26.6–33.0)
MCHC: 33 g/dL (ref 31.5–35.7)
MCV: 89 fL (ref 79–97)
Monocytes Absolute: 0.5 10*3/uL (ref 0.1–0.9)
Monocytes: 7 %
Neutrophils Absolute: 3.6 10*3/uL (ref 1.4–7.0)
Neutrophils: 51 %
Platelets: 191 10*3/uL (ref 150–379)
RBC: 4.57 x10E6/uL (ref 4.14–5.80)
RDW: 14.9 % (ref 12.3–15.4)
WBC: 7.1 10*3/uL (ref 3.4–10.8)

## 2018-02-19 ENCOUNTER — Ambulatory Visit (INDEPENDENT_AMBULATORY_CARE_PROVIDER_SITE_OTHER): Payer: Managed Care, Other (non HMO) | Admitting: Cardiovascular Disease

## 2018-02-19 ENCOUNTER — Encounter: Payer: Self-pay | Admitting: Cardiovascular Disease

## 2018-02-19 VITALS — BP 120/78 | HR 99 | Ht 75.0 in | Wt 282.0 lb

## 2018-02-19 DIAGNOSIS — I2510 Atherosclerotic heart disease of native coronary artery without angina pectoris: Secondary | ICD-10-CM | POA: Diagnosis not present

## 2018-02-19 DIAGNOSIS — E785 Hyperlipidemia, unspecified: Secondary | ICD-10-CM | POA: Diagnosis not present

## 2018-02-19 DIAGNOSIS — I10 Essential (primary) hypertension: Secondary | ICD-10-CM

## 2018-02-19 MED ORDER — ATORVASTATIN CALCIUM 40 MG PO TABS: 40.0000 mg | ORAL_TABLET | Freq: Every day | ORAL | 3 refills | Status: DC

## 2018-02-19 NOTE — Patient Instructions (Signed)

## 2018-02-19 NOTE — Progress Notes (Signed)
Cardiology Office Note Date:  02/19/2018   ID:  Keith Morrow, DOB [birth year], MRN [id number]  PCP:  [name], MD  [name], MD    Chief Complaint  Patient presents with  . Follow-up    CAD     History of Present Illness: Keith Morrow is a 47 y.o. male who presents for follow-up of coronary artery disease.  The patient initially presented in 2012 with a non-ST elevation infarction and he was treated with stenting of the LAD and angioplasty of the first diagonal followed by staged PCI of the posterior AV segment of the right coronary artery. Other medical problems include multiple sclerosis, hypertension, hyperlipidemia, and hypothyroidism.  The patient is here alone today.  He denies chest pain, shortness of breath, or heart palpitations.  States that his multiple sclerosis continues to progress slowly.  He is not getting around as well as he used to.  Past Medical History:  Diagnosis Date  . Acute bronchitis 05/09/2013  . Allergy   . Bronchitis, acute 12/10/2011  . Candidal skin infection 05/05/2017  . Chicken pox as a child  . Constipation 02/20/2017  . Coronary atherosclerosis of native coronary artery    a. NSTEMI 04/20/11: tx with Promus DES to LAD; bifurcation lesion with oDx 90% tx with POBA; b. staged PCI of right post. AV Branch with Promus DES; c. residual at cath 04/21/11:  AV groove CFX 50%, mRCA 30%,; EF 55%  . Depression   . Depression with anxiety   . Dermatitis, contact 12/10/2011  . Fatigue 10/15/2012  . Heart attack (HCC) 04-20-11  . HTN (hypertension)   . Hyperlipidemia   . Hypotestosteronism 11/17/2011  . Hypothyroidism   . Keloid scar   . MS (multiple sclerosis) (HCC)   . Neuromuscular disorder (HCC)    NUMBNESS/TINGLING  . Obesity   . Onychomycosis   . Preventative health care 10/07/2011  . Reflux 10/07/2011  . Sun-damaged skin 02/17/2016  . Testosterone deficiency 01/16/2012  . Tinea pedis   . Varicose veins of leg with pain  11/17/2011  . Visual changes 11/13/2016    Past Surgical History:  Procedure Laterality Date  . CORONARY ANGIOPLASTY WITH STENT PLACEMENT     2012  . PAIN PUMP IMPLANTATION N/A 08/29/2014   Procedure: Baclofen pump placement;  Surgeon: Maeola Harman, MD;  Location: [facility];  Service: Neurosurgery;  Laterality: N/A;  Baclofen pump placement  . PROGRAMABLE BACLOFEN PUMP REVISION  08/29/14  . right wrist surgery     CTR  . VASCULAR SURGERY     vericose vein in right femoral area  . VASECTOMY    . WISDOM TOOTH EXTRACTION      Current Outpatient Medications  Medication Sig Dispense Refill  . AMPYRA 10 MG TB12 TAKE 1 TABLET BY MOUTH EVERY 12 HOURS 60 tablet 10  . aspirin EC 81 MG tablet Take 81 mg by mouth daily.    Marland Kitchen atorvastatin (LIPITOR) 40 MG tablet Take 1 tablet (40 mg total) by mouth daily at 6 PM. Please keep upcoming appt for future refills. Thank you 90 tablet 0  . BACLOFEN IT 1 Dose by Intrathecal route as directed.     . cetirizine (ZYRTEC) 10 MG tablet Take 10 mg by mouth daily as needed for allergies.     . clobetasol cream (TEMOVATE) 0.05 % Apply 1 application topically 2 (two) times daily as needed (skin).    . clonazePAM (KLONOPIN) 1 MG tablet Take 1  tablet (1 mg total) by mouth 4 (four) times daily. 360 tablet 1  . Clonidine HCl POWD by Does not apply route. 720mg /ml IT Baclofen. Total dose 157.89mg /day. Last pump refill 06/02/16    . clotrimazole-betamethasone (LOTRISONE) cream Apply 1 application topically 2 (two) times daily as needed (anti-fungal). 30 g 2  . cyclobenzaprine (FLEXERIL) 10 MG tablet Take 1 tablet (10 mg total) by mouth 3 (three) times daily as needed for muscle spasms. 90 tablet 11  . CYMBALTA 60 MG capsule Take 1 capsule (60 mg total) by mouth 2 (two) times daily. 180 capsule 1  . gabapentin (NEURONTIN) 300 MG capsule Take 300-600 mg by mouth 3 (three) times daily. 300 mg by mouth morning and afternoon 600 mg by mouth at bedtime    . ibuprofen  (ADVIL,MOTRIN) 200 MG tablet Take 200 mg by mouth every 6 (six) hours as needed for moderate pain.    . methylphenidate (RITALIN) 10 MG tablet Take 10 mg by mouth 4 (four) times daily.    . natalizumab (TYSABRI) 300 MG/15ML injection Inject 300 mg into the vein every 30 (thirty) days.    . nitroGLYCERIN (NITROSTAT) 0.4 MG SL tablet PLACE 1 TABLET UNDER THE TOUNGE EVERY 5 MINUTES AS NEEDED FOR CHEST PAIN (X 3 DOSES) 25 tablet 0  . nystatin cream (MYCOSTATIN) Apply 1 application topically as directed.    . olmesartan (BENICAR) 20 MG tablet Take 1 tablet (20 mg total) by mouth daily. 90 tablet 2  . omeprazole (PRILOSEC OTC) 20 MG tablet Take 20 mg by mouth daily.    Marland Kitchen oseltamivir (TAMIFLU) 75 MG capsule Take 1 capsule (75 mg total) by mouth daily. 7 capsule 0  . ranitidine (ZANTAC) 300 MG tablet TAKE 1 TABLET BY MOUTH EVERYDAY AT BEDTIME 90 tablet 1  . SYNTHROID 150 MCG tablet TAKE ONE TABLET BY MOUTH DAILY BEFORE BREAKFAST 30 tablet 6  . tamsulosin (FLOMAX) 0.4 MG CAPS capsule TAKE ONE CAPSULE (0.4 MG TOTAL) BY MOUTH DAILY. 90 capsule 3   No current facility-administered medications for this visit.     Allergies:   Patient has no known allergies.   Social History:  The patient  reports that  has never smoked. he has never used smokeless tobacco. He reports that he drinks alcohol. He reports that he does not use drugs.   Family History:  The patient's  family history includes Coronary artery disease in his unknown relative; Heart attack (age of onset: 58) in his mother; Heart disease in his father, maternal grandfather, maternal grandmother, and mother; Hyperlipidemia in his maternal grandfather and maternal grandmother; Hypertension in his maternal grandfather and maternal grandmother; Stroke in his paternal grandfather; Thyroid disease in his brother, mother, sister, and sister.    ROS:  Please see the history of present illness.  Otherwise, review of systems is positive for weakness.  All other  systems are reviewed and negative.    PHYSICAL EXAM: VS:  BP 120/78   Pulse 99   Ht 6\' 3"  (1.905 m)   Wt 282 lb (127.9 kg)   BMI 35.25 kg/m  , BMI Body mass index is 35.25 kg/m. GEN: Well nourished, well developed, in no acute distress, ambulates slowly with a walker HEENT: normal  Neck: no JVD, no masses. No carotid bruits Cardiac: RRR without murmur or gallop      Respiratory:  clear to auscultation bilaterally, normal work of breathing GI: soft, nontender, nondistended, + BS MS: no deformity or atrophy  Ext: no pretibial edema,  pedal pulses 2+= bilaterally Skin: warm and dry, no rash Neuro:  Strength and sensation are intact Psych: euthymic mood, full affect  EKG:  EKG is ordered today. The ekg ordered today shows normal sinus rhythm 99 bpm, within normal limits.  Recent Labs: 08/14/2017: ALT 22; BUN 14; Creatinine, Ser 0.89; Potassium 4.1; Sodium 140; TSH 0.56 02/15/2018: Hemoglobin 13.4; Platelets 191   Lipid Panel     Component Value Date/Time   CHOL 135 08/14/2017 1124   TRIG 44.0 08/14/2017 1124   HDL 49.80 08/14/2017 1124   CHOLHDL 3 08/14/2017 1124   VLDL 8.8 08/14/2017 1124   LDLCALC 76 08/14/2017 1124      Wt Readings from Last 3 Encounters:  02/19/18 282 lb (127.9 kg)  02/15/18 284 lb (128.8 kg)  11/09/17 281 lb (127.5 kg)     Cardiac Studies Reviewed: Myoview stress test 12-20-2015: Study Highlights    Nuclear stress EF: 59%. Akinesis in the basal and mid inferior walls and paradoxical septal motion.  There was no ST segment deviation noted during stress.  Defect 1: There is a medium defect of moderate severity present in the basal inferior, mid inferior and apical inferior location.  The study is normal.  Findings consistent with prior myocardial infarction with peri-infarct ischemia.  This is an intermediate risk study.  The left ventricular ejection fraction is mildly decreased (45-54%).   There is a scar in the basal and mid inferior  walls with very mild ischemia in the mid and apical inferior walls (SDS 2).    ASSESSMENT AND PLAN: 1.  CAD, native vessel, without angina: The patient is stable on his current medical program.  He is treated with aspirin and a statin drug.  2. Hyperlipidemia: Most recent lipids are reviewed from September 2018 demonstrating a total cholesterol of 135, LDL 76, and HDL 50.  3. Essential HTN: Blood pressure is well controlled on Benicar  Current medicines are reviewed with the patient today.  The patient does not have concerns regarding medicines.  Labs/ tests ordered today include:  No orders of the defined types were placed in this encounter.   Disposition:   FU one year  Signed, Tonny Bollman, MD  02/19/2018 2:33 PM    [facility] [address] [place], [place], [place]  [address] Phone: [phone]; Fax: [phone]

## 2018-02-23 ENCOUNTER — Encounter: Payer: Self-pay | Admitting: *Deleted

## 2018-02-26 ENCOUNTER — Ambulatory Visit (INDEPENDENT_AMBULATORY_CARE_PROVIDER_SITE_OTHER): Payer: 59 | Admitting: Psychology

## 2018-02-26 DIAGNOSIS — F4323 Adjustment disorder with mixed anxiety and depressed mood: Secondary | ICD-10-CM

## 2018-02-28 ENCOUNTER — Encounter: Payer: Self-pay | Admitting: Family Medicine

## 2018-03-01 MED ORDER — SYNTHROID 150 MCG PO TABS: 150.0000 ug | ORAL_TABLET | Freq: Every day | ORAL | 0 refills | Status: DC

## 2018-03-05 ENCOUNTER — Telehealth: Payer: Self-pay | Admitting: *Deleted

## 2018-03-05 MED ORDER — DALFAMPRIDINE ER 10 MG PO TB12: 10.0000 mg | ORAL_TABLET | Freq: Two times a day (BID) | ORAL | 3 refills | Status: DC

## 2018-03-05 MED ORDER — METHYLPHENIDATE HCL 10 MG PO TABS: 10.0000 mg | ORAL_TABLET | Freq: Four times a day (QID) | ORAL | 0 refills | Status: DC

## 2018-03-05 NOTE — Telephone Encounter (Signed)
Ampyra escribed to [facility], and Ritalin faxed to same/fim

## 2018-03-11 ENCOUNTER — Telehealth: Payer: Self-pay | Admitting: *Deleted

## 2018-03-11 NOTE — Telephone Encounter (Signed)
Received Physician Orders/Plan of Care from [facility]; forwarded to provider/SLS 04/04

## 2018-03-12 ENCOUNTER — Ambulatory Visit (INDEPENDENT_AMBULATORY_CARE_PROVIDER_SITE_OTHER): Payer: 59 | Admitting: Psychology

## 2018-03-12 DIAGNOSIS — F4323 Adjustment disorder with mixed anxiety and depressed mood: Secondary | ICD-10-CM | POA: Diagnosis not present

## 2018-03-15 ENCOUNTER — Other Ambulatory Visit: Payer: Self-pay | Admitting: Neurology

## 2018-03-15 NOTE — Telephone Encounter (Signed)
Pt request refill for methylphenidate (RITALIN) 10 MG tablet °

## 2018-03-16 ENCOUNTER — Other Ambulatory Visit: Payer: Self-pay | Admitting: Family Medicine

## 2018-03-24 MED ORDER — METHYLPHENIDATE HCL 10 MG PO TABS: 10.0000 mg | ORAL_TABLET | Freq: Four times a day (QID) | ORAL | 0 refills | Status: DC

## 2018-03-24 NOTE — Addendum Note (Signed)
Addended by: Candis Schatz I on: 03/24/2018 03:18 PM   Modules accepted: Orders

## 2018-03-24 NOTE — Telephone Encounter (Signed)
Message left for pt to call with pharmacy information

## 2018-03-24 NOTE — Telephone Encounter (Signed)
Patient called back stating he uses [facility] in [facility].

## 2018-04-02 ENCOUNTER — Ambulatory Visit: Payer: Managed Care, Other (non HMO) | Admitting: Psychology

## 2018-04-09 ENCOUNTER — Telehealth: Payer: Self-pay | Admitting: *Deleted

## 2018-04-09 NOTE — Telephone Encounter (Signed)
Received Physician Orders/PT Plan of Care from [facility]; forwarded to provider/SLS 05/03

## 2018-04-16 ENCOUNTER — Ambulatory Visit (INDEPENDENT_AMBULATORY_CARE_PROVIDER_SITE_OTHER): Payer: Managed Care, Other (non HMO) | Admitting: Psychology

## 2018-04-16 DIAGNOSIS — F4323 Adjustment disorder with mixed anxiety and depressed mood: Secondary | ICD-10-CM | POA: Diagnosis not present

## 2018-05-04 ENCOUNTER — Encounter: Payer: Self-pay | Admitting: Neurology

## 2018-05-05 MED ORDER — METHYLPHENIDATE HCL 10 MG PO TABS: 10.0000 mg | ORAL_TABLET | Freq: Four times a day (QID) | ORAL | 0 refills | Status: DC

## 2018-05-06 ENCOUNTER — Telehealth: Payer: Self-pay | Admitting: *Deleted

## 2018-05-06 ENCOUNTER — Encounter: Payer: Self-pay | Admitting: Family Medicine

## 2018-05-06 NOTE — Telephone Encounter (Signed)
Received Physician Orders/Plan of Care from [facility]; forwarded to provider/SLS 05/30

## 2018-05-07 ENCOUNTER — Ambulatory Visit (INDEPENDENT_AMBULATORY_CARE_PROVIDER_SITE_OTHER): Payer: 59 | Admitting: Psychology

## 2018-05-07 DIAGNOSIS — F4323 Adjustment disorder with mixed anxiety and depressed mood: Secondary | ICD-10-CM

## 2018-05-07 NOTE — Telephone Encounter (Signed)
See pt's chart for request.

## 2018-05-17 ENCOUNTER — Other Ambulatory Visit: Payer: Self-pay | Admitting: Family Medicine

## 2018-05-19 ENCOUNTER — Ambulatory Visit (INDEPENDENT_AMBULATORY_CARE_PROVIDER_SITE_OTHER): Payer: 59 | Admitting: Psychology

## 2018-05-19 DIAGNOSIS — F4323 Adjustment disorder with mixed anxiety and depressed mood: Secondary | ICD-10-CM | POA: Diagnosis not present

## 2018-05-21 ENCOUNTER — Ambulatory Visit: Payer: Managed Care, Other (non HMO) | Admitting: Psychology

## 2018-05-23 ENCOUNTER — Encounter: Payer: Self-pay | Admitting: Family Medicine

## 2018-05-28 ENCOUNTER — Ambulatory Visit (INDEPENDENT_AMBULATORY_CARE_PROVIDER_SITE_OTHER): Payer: Managed Care, Other (non HMO) | Admitting: Family Medicine

## 2018-05-28 DIAGNOSIS — G35 Multiple sclerosis: Secondary | ICD-10-CM | POA: Diagnosis not present

## 2018-05-28 DIAGNOSIS — I10 Essential (primary) hypertension: Secondary | ICD-10-CM | POA: Diagnosis not present

## 2018-05-28 MED ORDER — SUCRALFATE 1 G PO TABS
1.0000 g | ORAL_TABLET | Freq: Three times a day (TID) | ORAL | 1 refills | Status: DC
Start: 2018-05-28 — End: 2018-09-07

## 2018-05-28 MED ORDER — NYSTATIN 100000 UNIT/GM EX CREA: 1.0000 "application " | TOPICAL_CREAM | CUTANEOUS | 2 refills | Status: DC

## 2018-05-28 MED ORDER — OMEPRAZOLE 40 MG PO CPDR: 40.0000 mg | DELAYED_RELEASE_CAPSULE | Freq: Every day | ORAL | 1 refills | Status: DC

## 2018-05-28 MED ORDER — SUCRALFATE 1 GM/10ML PO SUSP: 1.0000 g | Freq: Three times a day (TID) | ORAL | 0 refills | Status: DC

## 2018-05-28 NOTE — Patient Instructions (Signed)
Incision Care, Adult °An incision is a cut that a doctor makes in your skin for surgery (for a procedure). Most times, these cuts are closed after surgery. Your cut from surgery may be closed with stitches (sutures), staples, skin glue, or skin tape (adhesive strips). You may need to return to your doctor to have stitches or staples taken out. This may happen many days or many weeks after your surgery. The cut needs to be well cared for so it does not get infected. °How to care for your cut °Cut care °· Follow instructions from your doctor about how to take care of your cut. Make sure you: °? Wash your hands with soap and water before you change your bandage (dressing). If you cannot use soap and water, use hand sanitizer. °? Change your bandage as told by your doctor. °? Leave stitches, skin glue, or skin tape in place. They may need to stay in place for 2 weeks or longer. If tape strips get loose and curl up, you may trim the loose edges. Do not remove tape strips completely unless your doctor says it is okay. °· Check your cut area every day for signs of infection. Check for: °? More redness, swelling, or pain. °? More fluid or blood. °? Warmth. °? Pus or a bad smell. °· Ask your doctor how to clean the cut. This may include: °? Using mild soap and water. °? Using a clean towel to pat the cut dry after you clean it. °? Putting a cream or ointment on the cut. Do this only as told by your doctor. °? Covering the cut with a clean bandage. °· Ask your doctor when you can leave the cut uncovered. °· Do not take baths, swim, or use a hot tub until your doctor says it is okay. Ask your doctor if you can take showers. You may only be allowed to take sponge baths for bathing. °Medicines °· If you were prescribed an antibiotic medicine, cream, or ointment, take the antibiotic or put it on the cut as told by your doctor. Do not stop taking or putting on the antibiotic even if your condition gets better. °· Take  over-the-counter and prescription medicines only as told by your doctor. °General instructions °· Limit movement around your cut. This helps healing. °? Avoid straining, lifting, or exercise for the first month, or for as long as told by your doctor. °? Follow instructions from your doctor about going back to your normal activities. °? Ask your doctor what activities are safe. °· Protect your cut from the sun when you are outside for the first 6 months, or for as long as told by your doctor. Put on sunscreen around the scar or cover up the scar. °· Keep all follow-up visits as told by your doctor. This is important. °Contact a doctor if: °· Your have more redness, swelling, or pain around the cut. °· You have more fluid or blood coming from the cut. °· Your cut feels warm to the touch. °· You have pus or a bad smell coming from the cut. °· You have a fever or shaking chills. °· You feel sick to your stomach (nauseous) or you throw up (vomit). °· You are dizzy. °· Your stitches or staples come undone. °Get help right away if: °· You have a red streak coming from your cut. °· Your cut bleeds through the bandage and the bleeding does not stop with gentle pressure. °· The edges of your cut open   up and separate. °· You have very bad (severe) pain. °· You have a rash. °· You are confused. °· You pass out (faint). °· You have trouble breathing and you have a fast heartbeat. °This information is not intended to replace advice given to you by your health care provider. Make sure you discuss any questions you have with your health care provider. °Document Released: 02/16/2012 Document Revised: 08/01/2016 Document Reviewed: 08/01/2016 °Elsevier Interactive Patient Education © 2017 [employer] ° °

## 2018-05-28 NOTE — Progress Notes (Signed)
122 [id number]  

## 2018-05-29 ENCOUNTER — Other Ambulatory Visit: Payer: Self-pay | Admitting: Family Medicine

## 2018-05-30 NOTE — Assessment & Plan Note (Signed)
[place] suffered a head laceration. It required a trip to the hospital and 4 staples to be placed. He is here to have them removed. He did not suffer any syncopes, nausea, vomiting, headaches or other concerning symptoms since the fall. Head cleaned with H2O2 and ETOH and then staples removed easily. Patient tolerated well. May wash gently with mild soap and water this week. No scrubbing

## 2018-05-30 NOTE — Assessment & Plan Note (Signed)
Well controlled, no changes to meds. Encouraged heart healthy diet such as the DASH diet and exercise as tolerated.  °

## 2018-05-30 NOTE — Progress Notes (Signed)
Patient ID: Keith Morrow, male   DOB: 04/07/1971, 47 y.o.   MRN: [id number]   Subjective:    Patient ID: Keith Morrow, male    DOB: 07-11-1971, 47 y.o.   MRN: [id number]  No chief complaint on file.   HPI Patient is in today for staple removal. [place] suffered a head laceration. It required a trip to the hospital and 4 staples to be placed. He is here to have them removed. He did not suffer any syncopes, nausea, vomiting, headaches or other concerning symptoms since the fall. He was arising from a chair and the chair tipped over causing his fall. Denies CP/palp/SOB/HA/congestion/fevers/GI or GU c/o. Taking meds as prescribed Past Medical History:  Diagnosis Date  . Acute bronchitis 05/09/2013  . Allergy   . Bronchitis, acute 12/10/2011  . Candidal skin infection 05/05/2017  . Chicken pox as a child  . Constipation 02/20/2017  . Coronary atherosclerosis of native coronary artery    a. NSTEMI 04/20/11: tx with Promus DES to LAD; bifurcation lesion with oDx 90% tx with POBA; b. staged PCI of right post. AV Branch with Promus DES; c. residual at cath 04/21/11:  AV groove CFX 50%, mRCA 30%,; EF 55%  . Depression   . Depression with anxiety   . Dermatitis, contact 12/10/2011  . Fatigue 10/15/2012  . Heart attack (HCC) 04-20-11  . HTN (hypertension)   . Hyperlipidemia   . Hypotestosteronism 11/17/2011  . Hypothyroidism   . Keloid scar   . MS (multiple sclerosis) (HCC)   . Neuromuscular disorder (HCC)    NUMBNESS/TINGLING  . Obesity   . Onychomycosis   . Preventative health care 10/07/2011  . Reflux 10/07/2011  . Sun-damaged skin 02/17/2016  . Testosterone deficiency 01/16/2012  . Tinea pedis   . 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 Visual changes 11/13/2016    Past Surgical History:  Procedure Laterality Date  . CORONARY ANGIOPLASTY WITH STENT PLACEMENT     2012  . PAIN PUMP IMPLANTATION N/A 08/29/2014   Procedure: Baclofen pump  placement;  Surgeon: Maeola Harman, MD;  Location: [facility];  Service: Neurosurgery;  Laterality: N/A;  Baclofen pump placement  . PROGRAMABLE BACLOFEN PUMP REVISION  08/29/14  . right wrist surgery     CTR  . VASCULAR SURGERY     vericose vein in right femoral area  . VASECTOMY    . WISDOM TOOTH EXTRACTION      Family History  Problem Relation Age of Onset  . Heart attack Mother 68  . Thyroid disease Mother   . Heart disease Mother   . Thyroid disease Sister   . Thyroid disease Brother   . Heart disease Maternal Grandmother   . Hypertension Maternal Grandmother   . Hyperlipidemia Maternal Grandmother   . Heart disease Maternal Grandfather   . Hypertension Maternal Grandfather   . Hyperlipidemia Maternal Grandfather   . Stroke Paternal Grandfather   . Thyroid disease Sister   . Heart disease Father        2 stents  . Coronary artery disease Unknown        questionable in father    Social History   Socioeconomic History  . Marital status: Married    Spouse name: Not on file  . Number of children: 3  . Years of education: Not on file  . Highest education level: Not on file  Occupational History  . Not on  file  Social Needs  . Financial resource strain: Not on file  . Food insecurity:    Worry: Not on file    Inability: Not on file  . Transportation needs:    Medical: Not on file    Non-medical: Not on file  Tobacco Use  . Smoking status: Never Smoker  . Smokeless tobacco: Never Used  Substance and Sexual Activity  . Alcohol use: Yes    Comment: socially  . Drug use: No  . 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 Physical activity:    Days per week: Not on file    Minutes per session: Not on file  . Stress: Not on file  Relationships  . Social connections:    Talks on phone: Not on file    Gets together: Not on file    Attends religious service:  Not on file    Active member of club or organization: Not on file    Attends meetings of clubs or organizations: Not on file    Relationship status: Not on file  . Intimate partner violence:    Fear of current or ex partner: Not on file    Emotionally abused: Not on file    Physically abused: Not on file    Forced sexual activity: Not on file  Other Topics Concern  . Not on file  Social History Narrative  . Not on file    Outpatient Medications Prior to Visit  Medication Sig Dispense Refill  . aspirin EC 81 MG tablet Take 81 mg by mouth daily.    Marland Kitchen atorvastatin (LIPITOR) 40 MG tablet Take 1 tablet (40 mg total) by mouth daily at 6 PM. 90 tablet 3  . BACLOFEN IT 1 Dose by Intrathecal route as directed.     . cetirizine (ZYRTEC) 10 MG tablet Take 10 mg by mouth daily as needed for allergies.     . clobetasol cream (TEMOVATE) 0.05 % Apply 1 application topically 2 (two) times daily as needed (skin).    . clonazePAM (KLONOPIN) 1 MG tablet Take 1 tablet (1 mg total) by mouth 4 (four) times daily. 360 tablet 1  . Clonidine HCl POWD by Does not apply route. 720mg /ml IT Baclofen. Total dose 157.89mg /day. Last pump refill 06/02/16    . clotrimazole-betamethasone (LOTRISONE) cream Apply 1 application topically 2 (two) times daily as needed (anti-fungal). 30 g 2  . cyclobenzaprine (FLEXERIL) 10 MG tablet Take 1 tablet (10 mg total) by mouth 3 (three) times daily as needed for muscle spasms. 90 tablet 11  . CYMBALTA 60 MG capsule Take 1 capsule (60 mg total) by mouth 2 (two) times daily. 180 capsule 1  . dalfampridine (AMPYRA) 10 MG TB12 Take 1 tablet (10 mg total) by mouth every 12 (twelve) hours. 180 tablet 3  . gabapentin (NEURONTIN) 300 MG capsule Take 300-600 mg by mouth 3 (three) times daily. 300 mg by mouth morning and afternoon 600 mg by mouth at bedtime    . ibuprofen (ADVIL,MOTRIN) 200 MG tablet Take 200 mg by mouth every 6 (six) hours as needed for moderate pain.    . methylphenidate  (RITALIN) 10 MG tablet Take 1 tablet (10 mg total) by mouth 4 (four) times daily. 120 tablet 0  . natalizumab (TYSABRI) 300 MG/15ML injection Inject 300 mg into the vein every 30 (thirty) days.    . nitroGLYCERIN (NITROSTAT)  0.4 MG SL tablet PLACE 1 TABLET UNDER THE TOUNGE EVERY 5 MINUTES AS NEEDED FOR CHEST PAIN (X 3 DOSES) 25 tablet 0  . olmesartan (BENICAR) 20 MG tablet Take 1 tablet (20 mg total) by mouth daily. 90 tablet 2  . olmesartan (BENICAR) 20 MG tablet TAKE 1 TABLET BY MOUTH EVERY DAY 90 tablet 1  . oseltamivir (TAMIFLU) 75 MG capsule Take 1 capsule (75 mg total) by mouth daily. 7 capsule 0  . ranitidine (ZANTAC) 300 MG tablet TAKE 1 TABLET BY MOUTH EVERYDAY AT BEDTIME 90 tablet 1  . SYNTHROID 150 MCG tablet Take 1 tablet (150 mcg total) by mouth daily before breakfast. 90 tablet 0  . tamsulosin (FLOMAX) 0.4 MG CAPS capsule TAKE ONE CAPSULE BY MOUTH EVERY DAY 90 capsule 3  . nystatin cream (MYCOSTATIN) Apply 1 application topically as directed.    Marland Kitchen omeprazole (PRILOSEC OTC) 20 MG tablet Take 20 mg by mouth daily.     No facility-administered medications prior to visit.     No Known Allergies  Review of Systems  Constitutional: Negative for fever and malaise/fatigue.  HENT: Negative for congestion.   Eyes: Negative for blurred vision.  Respiratory: Negative for shortness of breath.   Cardiovascular: Negative for chest pain, palpitations and leg swelling.  Gastrointestinal: Negative for abdominal pain, blood in stool and nausea.  Genitourinary: Negative for dysuria and frequency.  Musculoskeletal: Positive for falls.  Skin: Negative for rash.  Neurological: Positive for weakness. Negative for dizziness, tingling, tremors, sensory change, speech change, focal weakness, seizures, loss of consciousness and headaches.  Endo/Heme/Allergies: Negative for environmental allergies.  Psychiatric/Behavioral: Negative for depression. The patient is not nervous/anxious.          Objective:    Physical Exam  Constitutional: He is oriented to person, place, and time. He appears well-developed and well-nourished. No distress.  HENT:  Head: Normocephalic and atraumatic.  Nose: Nose normal.  Eyes: Right eye exhibits no discharge. Left eye exhibits no discharge.  Neck: Normal range of motion. Neck supple.  Cardiovascular: Normal rate and regular rhythm.  No murmur heard. Pulmonary/Chest: Effort normal and breath sounds normal.  Abdominal: Soft. Bowel sounds are normal. There is no tenderness.  Musculoskeletal: He exhibits no edema.  Neurological: He is alert and oriented to person, place, and time.  Skin: Skin is warm and dry.  2 inch laceration left parietal scalp. Healing well with 4 staples in place. No fluctuance or discharge, no redness. Area cleaned well with H2O2 and staples removed easily.   Psychiatric: He has a normal mood and affect.  Nursing note and vitals reviewed.   BP (!) 122/58 (BP Location: Left Arm, Patient Position: Sitting, Cuff Size: Normal)   Pulse 99   Temp 98.1 F (36.7 C) (Oral)   Resp 18   Wt 283 lb (128.4 kg)   SpO2 99%   BMI 35.37 kg/m  Wt Readings from Last 3 Encounters:  05/28/18 283 lb (128.4 kg)  02/19/18 282 lb (127.9 kg)  02/15/18 284 lb (128.8 kg)     Lab Results  Component Value Date   WBC 7.1 02/15/2018   HGB 13.4 02/15/2018   HCT 40.6 02/15/2018   PLT 191 02/15/2018   GLUCOSE 93 08/14/2017   CHOL 135 08/14/2017   TRIG 44.0 08/14/2017   HDL 49.80 08/14/2017   LDLCALC 76 08/14/2017   ALT 22 08/14/2017   AST 18 08/14/2017   NA 140 08/14/2017   K 4.1 08/14/2017   CL 103 08/14/2017  CREATININE 0.89 08/14/2017   BUN 14 08/14/2017   CO2 31 08/14/2017   TSH 0.56 08/14/2017   PSA 0.52 02/11/2016   INR 1.00 04/21/2011    Lab Results  Component Value Date   TSH 0.56 08/14/2017   Lab Results  Component Value Date   WBC 7.1 02/15/2018   HGB 13.4 02/15/2018   HCT 40.6 02/15/2018   MCV 89 02/15/2018    PLT 191 02/15/2018   Lab Results  Component Value Date   NA 140 08/14/2017   K 4.1 08/14/2017   CO2 31 08/14/2017   GLUCOSE 93 08/14/2017   BUN 14 08/14/2017   CREATININE 0.89 08/14/2017   BILITOT 0.6 08/14/2017   ALKPHOS 57 08/14/2017   AST 18 08/14/2017   ALT 22 08/14/2017   PROT 6.7 08/14/2017   ALBUMIN 4.3 08/14/2017   CALCIUM 9.3 08/14/2017   ANIONGAP 10 12/07/2015   GFR 98.48 08/14/2017   Lab Results  Component Value Date   CHOL 135 08/14/2017   Lab Results  Component Value Date   HDL 49.80 08/14/2017   Lab Results  Component Value Date   LDLCALC 76 08/14/2017   Lab Results  Component Value Date   TRIG 44.0 08/14/2017   Lab Results  Component Value Date   CHOLHDL 3 08/14/2017   No results found for: HGBA1C     Assessment & Plan:   Problem List Items Addressed This Visit    Multiple sclerosis (HCC)    [place] suffered a head laceration. It required a trip to the hospital and 4 staples to be placed. He is here to have them removed. He did not suffer any syncopes, nausea, vomiting, headaches or other concerning symptoms since the fall. Head cleaned with H2O2 and ETOH and then staples removed easily. Patient tolerated well. May wash gently with mild soap and water this week. No scrubbing       HTN (hypertension)    Well controlled, no changes to meds. Encouraged heart healthy diet such as the DASH diet and exercise as tolerated.          I have discontinued Jari Dipasquale. Friberg's omeprazole. I am also having him start on omeprazole, sucralfate, and sucralfate. Additionally, I am having him maintain his cetirizine, aspirin EC, ibuprofen, BACLOFEN IT, natalizumab, cyclobenzaprine, Clonidine HCl, clotrimazole-betamethasone, nitroGLYCERIN, olmesartan, CYMBALTA, clonazePAM, oseltamivir, clobetasol cream, gabapentin, atorvastatin, SYNTHROID, dalfampridine, olmesartan, tamsulosin, methylphenidate, ranitidine, and nystatin  cream.  Meds ordered this encounter  Medications  . nystatin cream (MYCOSTATIN)    Sig: Apply 1 application topically as directed.    Dispense:  30 g    Refill:  2  . omeprazole (PRILOSEC) 40 MG capsule    Sig: Take 1 capsule (40 mg total) by mouth daily.    Dispense:  90 capsule    Refill:  1  . sucralfate (CARAFATE) 1 g tablet    Sig: Take 1 tablet (1 g total) by mouth 4 (four) times daily -  with meals and at bedtime. Take tablet or liquid but not both    Dispense:  120 tablet    Refill:  1  . sucralfate (CARAFATE) 1 GM/10ML suspension    Sig: Take 10 mLs (1 g total) by mouth 4 (four) times daily -  with meals and at bedtime. Take liquid or tablets but not both    Dispense:  420 mL    Refill:  0     [name], MD

## 2018-06-01 ENCOUNTER — Telehealth: Payer: Self-pay | Admitting: *Deleted

## 2018-06-01 NOTE — Telephone Encounter (Signed)
Received Physician Orders/Plan of Care from [facility]; forwarded to provider/SLS 06/25

## 2018-06-02 ENCOUNTER — Other Ambulatory Visit: Payer: Self-pay

## 2018-06-02 MED ORDER — CYMBALTA 60 MG PO CPEP: 60.0000 mg | ORAL_CAPSULE | Freq: Two times a day (BID) | ORAL | 1 refills | Status: DC

## 2018-06-04 ENCOUNTER — Ambulatory Visit: Payer: 59 | Admitting: Psychology

## 2018-06-13 ENCOUNTER — Encounter: Payer: Self-pay | Admitting: Neurology

## 2018-06-14 MED ORDER — CYCLOBENZAPRINE HCL 10 MG PO TABS: 10.0000 mg | ORAL_TABLET | Freq: Three times a day (TID) | ORAL | 11 refills | Status: DC | PRN

## 2018-06-29 ENCOUNTER — Telehealth: Payer: Self-pay | Admitting: *Deleted

## 2018-06-29 NOTE — Telephone Encounter (Signed)
Received Physician Orders/Plan of Care from [facility]; forwarded to provider/SLS 07/23

## 2018-07-02 ENCOUNTER — Ambulatory Visit (INDEPENDENT_AMBULATORY_CARE_PROVIDER_SITE_OTHER): Payer: 59 | Admitting: Psychology

## 2018-07-02 DIAGNOSIS — F4323 Adjustment disorder with mixed anxiety and depressed mood: Secondary | ICD-10-CM | POA: Diagnosis not present

## 2018-07-05 ENCOUNTER — Other Ambulatory Visit: Payer: Self-pay | Admitting: *Deleted

## 2018-07-05 ENCOUNTER — Encounter: Payer: Self-pay | Admitting: Neurology

## 2018-07-05 MED ORDER — METHYLPHENIDATE HCL 10 MG PO TABS: 10.0000 mg | ORAL_TABLET | Freq: Four times a day (QID) | ORAL | 0 refills | Status: DC

## 2018-07-05 NOTE — Progress Notes (Signed)
I escribed it 

## 2018-07-05 NOTE — Telephone Encounter (Signed)
Sent request to Dr. Epimenio Foot to escribe

## 2018-07-05 NOTE — Progress Notes (Signed)
Noted  

## 2018-07-06 ENCOUNTER — Telehealth: Payer: Self-pay | Admitting: *Deleted

## 2018-07-06 NOTE — Telephone Encounter (Signed)
Faxed completed/signed [facility] pt status report and reauth questionnaire to [facility] at [phone]. Received confirmation.  Received fax notification that pt re-auth through [facility] prescribing program from 07/06/18-02/01/19.  Account: [employer], Site Auth #: [id number]. PT enrollment #: [id number]

## 2018-07-16 ENCOUNTER — Ambulatory Visit (INDEPENDENT_AMBULATORY_CARE_PROVIDER_SITE_OTHER): Payer: 59 | Admitting: Psychology

## 2018-07-16 DIAGNOSIS — F4323 Adjustment disorder with mixed anxiety and depressed mood: Secondary | ICD-10-CM

## 2018-07-30 ENCOUNTER — Encounter: Payer: Self-pay | Admitting: Family Medicine

## 2018-07-30 ENCOUNTER — Ambulatory Visit (INDEPENDENT_AMBULATORY_CARE_PROVIDER_SITE_OTHER): Payer: 59 | Admitting: Psychology

## 2018-07-30 ENCOUNTER — Ambulatory Visit (INDEPENDENT_AMBULATORY_CARE_PROVIDER_SITE_OTHER): Payer: Managed Care, Other (non HMO) | Admitting: Family Medicine

## 2018-07-30 VITALS — BP 112/62 | HR 91 | Temp 98.8°F | Resp 16 | Ht 75.0 in | Wt 286.0 lb

## 2018-07-30 DIAGNOSIS — R5383 Other fatigue: Secondary | ICD-10-CM

## 2018-07-30 DIAGNOSIS — E039 Hypothyroidism, unspecified: Secondary | ICD-10-CM | POA: Diagnosis not present

## 2018-07-30 DIAGNOSIS — K625 Hemorrhage of anus and rectum: Secondary | ICD-10-CM | POA: Diagnosis not present

## 2018-07-30 DIAGNOSIS — Z0000 Encounter for general adult medical examination without abnormal findings: Secondary | ICD-10-CM | POA: Diagnosis not present

## 2018-07-30 DIAGNOSIS — G35 Multiple sclerosis: Secondary | ICD-10-CM

## 2018-07-30 DIAGNOSIS — R7989 Other specified abnormal findings of blood chemistry: Secondary | ICD-10-CM | POA: Diagnosis not present

## 2018-07-30 DIAGNOSIS — F4323 Adjustment disorder with mixed anxiety and depressed mood: Secondary | ICD-10-CM

## 2018-07-30 DIAGNOSIS — I10 Essential (primary) hypertension: Secondary | ICD-10-CM | POA: Diagnosis not present

## 2018-07-30 DIAGNOSIS — E785 Hyperlipidemia, unspecified: Secondary | ICD-10-CM | POA: Diagnosis not present

## 2018-07-30 DIAGNOSIS — E6609 Other obesity due to excess calories: Secondary | ICD-10-CM

## 2018-07-30 DIAGNOSIS — L989 Disorder of the skin and subcutaneous tissue, unspecified: Secondary | ICD-10-CM

## 2018-07-30 LAB — COMPREHENSIVE METABOLIC PANEL
ALT: 28 U/L (ref 0–53)
AST: 16 U/L (ref 0–37)
Albumin: 4.5 g/dL (ref 3.5–5.2)
Alkaline Phosphatase: 64 U/L (ref 39–117)
BUN: 19 mg/dL (ref 6–23)
CO2: 34 mEq/L — ABNORMAL HIGH (ref 19–32)
Calcium: 9.6 mg/dL (ref 8.4–10.5)
Chloride: 103 mEq/L (ref 96–112)
Creatinine, Ser: 0.94 mg/dL (ref 0.40–1.50)
GFR: 92.06 mL/min (ref >60.00)
Glucose, Bld: 90 mg/dL (ref 70–99)
Potassium: 4.3 mEq/L (ref 3.5–5.1)
Sodium: 141 mEq/L (ref 135–145)
Total Bilirubin: 0.6 mg/dL (ref 0.2–1.2)
Total Protein: 6.8 g/dL (ref 6.0–8.3)

## 2018-07-30 LAB — TSH: TSH: 1.63 u[IU]/mL (ref 0.35–4.50)

## 2018-07-30 LAB — CBC
HCT: 42.6 % (ref 39.0–52.0)
Hemoglobin: 14.5 g/dL (ref 13.0–17.0)
MCHC: 34.2 g/dL (ref 30.0–36.0)
MCV: 87.7 fl (ref 78.0–100.0)
Platelets: 190 10*3/uL (ref 150.0–400.0)
RBC: 4.86 Mil/uL (ref 4.22–5.81)
RDW: 14.9 % (ref 11.5–15.5)
WBC: 7.6 10*3/uL (ref 4.0–10.5)

## 2018-07-30 LAB — LIPID PANEL
Cholesterol: 160 mg/dL (ref 0–200)
HDL: 59 mg/dL (ref >39.00)
LDL Cholesterol: 91 mg/dL (ref 0–99)
NonHDL: 101.31
Total CHOL/HDL Ratio: 3
Triglycerides: 52 mg/dL (ref 0.0–149.0)
VLDL: 10.4 mg/dL (ref 0.0–40.0)

## 2018-07-30 LAB — TESTOSTERONE: Testosterone: 257.76 ng/dL — ABNORMAL LOW (ref 300.00–890.00)

## 2018-07-30 MED ORDER — SUCRALFATE 1 GM/10ML PO SUSP: 1.0000 g | Freq: Three times a day (TID) | ORAL | 1 refills | Status: DC

## 2018-07-30 NOTE — Assessment & Plan Note (Signed)
Encouraged heart healthy diet, increase exercise, avoid trans fats, consider a krill oil cap daily 

## 2018-07-30 NOTE — Assessment & Plan Note (Signed)
On Levothyroxine, continue to monitor 

## 2018-07-30 NOTE — Assessment & Plan Note (Signed)
Well controlled, no changes to meds. Encouraged heart healthy diet such as the DASH diet and exercise as tolerated.  °

## 2018-07-30 NOTE — Patient Instructions (Signed)

## 2018-08-01 ENCOUNTER — Encounter: Payer: Self-pay | Admitting: Family Medicine

## 2018-08-01 DIAGNOSIS — K625 Hemorrhage of anus and rectum: Secondary | ICD-10-CM | POA: Insufficient documentation

## 2018-08-01 DIAGNOSIS — L989 Disorder of the skin and subcutaneous tissue, unspecified: Secondary | ICD-10-CM | POA: Insufficient documentation

## 2018-08-01 NOTE — Assessment & Plan Note (Signed)
Patient encouraged to maintain heart healthy diet, regular exercise, adequate sleep. Consider daily probiotics. Take medications as prescribed. Labs reviewed 

## 2018-08-01 NOTE — Assessment & Plan Note (Signed)
Encouraged DASH diet, decrease po intake and increase exercise as tolerated. Needs 7-8 hours of sleep nightly. Avoid trans fats, eat small, frequent meals every 4-5 hours with lean proteins, complex carbs and healthy fats. Minimize simple carbs, considering referral to healthy weight and wellness.

## 2018-08-01 NOTE — Assessment & Plan Note (Signed)
right parietal region referred to dermatology for further consideration.

## 2018-08-01 NOTE — Assessment & Plan Note (Signed)
They are just completing a renovation of his house so it will be handicap accesible.

## 2018-08-01 NOTE — Assessment & Plan Note (Signed)
Check level today 

## 2018-08-01 NOTE — Progress Notes (Signed)
Subjective:    Patient ID: Keith Morrow, male    DOB: 11-Sep-1971, 47 y.o.   MRN: [id number]  Chief Complaint  Patient presents with  . Hypothyroidism  . Hypertension    HPI Patient is in today for annual preventative exam. No recent febrile illness or hospitalizations. They are just finishing a remodel on their home to make it handicap accessible to help as his MS worsens. He continues to work but it is becoming incesingly difficult. He has been noting come constipation and sometimes when he strains he sees bright red blood on tissue. He also notes a lesion on his right scalp that is growing. Denies CP/palp/SOB/HA/congestion/fevers/GI or GU c/o. Taking meds as prescribed  Past Medical History:  Diagnosis Date  . Acute bronchitis 05/09/2013  . Allergy   . Bronchitis, acute 12/10/2011  . Candidal skin infection 05/05/2017  . Chicken pox as a child  . Constipation 02/20/2017  . Coronary atherosclerosis of native coronary artery    a. NSTEMI 04/20/11: tx with Promus DES to LAD; bifurcation lesion with oDx 90% tx with POBA; b. staged PCI of right post. AV Branch with Promus DES; c. residual at cath 04/21/11:  AV groove CFX 50%, mRCA 30%,; EF 55%  . Depression   . Depression with anxiety   . Dermatitis, contact 12/10/2011  . Fatigue 10/15/2012  . Heart attack (HCC) 04-20-11  . HTN (hypertension)   . Hyperlipidemia   . Hypotestosteronism 11/17/2011  . Hypothyroidism   . Keloid scar   . MS (multiple sclerosis) (HCC)   . Neuromuscular disorder (HCC)    NUMBNESS/TINGLING  . Obesity   . Onychomycosis   . Preventative health care 10/07/2011  . Reflux 10/07/2011  . Sun-damaged skin 02/17/2016  . Testosterone deficiency 01/16/2012  . Tinea pedis   . 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 Visual changes 11/13/2016    Past Surgical History:  Procedure Laterality Date  . CORONARY ANGIOPLASTY WITH STENT PLACEMENT     2012  . PAIN PUMP IMPLANTATION N/A 08/29/2014   Procedure: Baclofen pump  placement;  Surgeon: Maeola Harman, MD;  Location: [facility];  Service: Neurosurgery;  Laterality: N/A;  Baclofen pump placement  . PROGRAMABLE BACLOFEN PUMP REVISION  08/29/14  . right wrist surgery     CTR  . VASCULAR SURGERY     vericose vein in right femoral area  . VASECTOMY    . WISDOM TOOTH EXTRACTION      Family History  Problem Relation Age of Onset  . Heart attack Mother 16  . Thyroid disease Mother   . Heart disease Mother   . Thyroid disease Sister   . Thyroid disease Brother   . Heart disease Maternal Grandmother   . Hypertension Maternal Grandmother   . Hyperlipidemia Maternal Grandmother   . Heart disease Maternal Grandfather   . Hypertension Maternal Grandfather   . Hyperlipidemia Maternal Grandfather   . Stroke Paternal Grandfather   . Thyroid disease Sister   . Heart disease Father        2 stents  . Coronary artery disease Unknown        questionable in father    Social History   Socioeconomic History  . Marital status: Married    Spouse name: Not on file  . Number of children: 3  . Years of education: Not on file  . Highest education level: Not on file  Occupational History  . Not on file  Social Needs  .  Financial resource strain: Not on file  . Food insecurity:    Worry: Not on file    Inability: Not on file  . Transportation needs:    Medical: Not on file    Non-medical: Not on file  Tobacco Use  . Smoking status: Never Smoker  . Smokeless tobacco: Never Used  Substance and Sexual Activity  . Alcohol use: Yes    Comment: socially  . Drug use: No  . 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 Physical activity:    Days per week: Not on file    Minutes per session: Not on file  . Stress: Not on file  Relationships  . Social connections:    Talks on phone: Not on file    Gets together: Not on file    Attends religious service:  Not on file    Active member of club or organization: Not on file    Attends meetings of clubs or organizations: Not on file    Relationship status: Not on file  . Intimate partner violence:    Fear of current or ex partner: Not on file    Emotionally abused: Not on file    Physically abused: Not on file    Forced sexual activity: Not on file  Other Topics Concern  . Not on file  Social History Narrative  . Not on file    Outpatient Medications Prior to Visit  Medication Sig Dispense Refill  . aspirin EC 81 MG tablet Take 81 mg by mouth daily.    Marland Kitchen atorvastatin (LIPITOR) 40 MG tablet Take 1 tablet (40 mg total) by mouth daily at 6 PM. 90 tablet 3  . BACLOFEN IT 1 Dose by Intrathecal route as directed.     . cetirizine (ZYRTEC) 10 MG tablet Take 10 mg by mouth daily as needed for allergies.     . clobetasol cream (TEMOVATE) 0.05 % Apply 1 application topically 2 (two) times daily as needed (skin).    . clonazePAM (KLONOPIN) 1 MG tablet Take 1 tablet (1 mg total) by mouth 4 (four) times daily. 360 tablet 1  . Clonidine HCl POWD by Does not apply route. 720mg /ml IT Baclofen. Total dose 157.89mg /day. Last pump refill 06/02/16    . clotrimazole-betamethasone (LOTRISONE) cream Apply 1 application topically 2 (two) times daily as needed (anti-fungal). 30 g 2  . cyclobenzaprine (FLEXERIL) 10 MG tablet Take 1 tablet (10 mg total) by mouth 3 (three) times daily as needed for muscle spasms. 90 tablet 11  . CYMBALTA 60 MG capsule Take 1 capsule (60 mg total) by mouth 2 (two) times daily. 180 capsule 1  . dalfampridine (AMPYRA) 10 MG TB12 Take 1 tablet (10 mg total) by mouth every 12 (twelve) hours. 180 tablet 3  . gabapentin (NEURONTIN) 300 MG capsule Take 300-600 mg by mouth 3 (three) times daily. 300 mg by mouth morning and afternoon 600 mg by mouth at bedtime    . ibuprofen (ADVIL,MOTRIN) 200 MG tablet Take 200 mg by mouth every 6 (six) hours as needed for moderate pain.    . methylphenidate  (RITALIN) 10 MG tablet Take 1 tablet (10 mg total) by mouth 4 (four) times daily. 120 tablet 0  . natalizumab (TYSABRI) 300 MG/15ML injection Inject 300 mg into the vein every 30 (thirty) days.    . nitroGLYCERIN (NITROSTAT) 0.4 MG SL tablet PLACE 1  TABLET UNDER THE TOUNGE EVERY 5 MINUTES AS NEEDED FOR CHEST PAIN (X 3 DOSES) 25 tablet 0  . nystatin cream (MYCOSTATIN) Apply 1 application topically as directed. 30 g 2  . olmesartan (BENICAR) 20 MG tablet Take 1 tablet (20 mg total) by mouth daily. 90 tablet 2  . olmesartan (BENICAR) 20 MG tablet TAKE 1 TABLET BY MOUTH EVERY DAY 90 tablet 1  . omeprazole (PRILOSEC) 40 MG capsule Take 1 capsule (40 mg total) by mouth daily. 90 capsule 1  . oseltamivir (TAMIFLU) 75 MG capsule Take 1 capsule (75 mg total) by mouth daily. 7 capsule 0  . ranitidine (ZANTAC) 300 MG tablet TAKE 1 TABLET BY MOUTH EVERYDAY AT BEDTIME 90 tablet 1  . sucralfate (CARAFATE) 1 g tablet Take 1 tablet (1 g total) by mouth 4 (four) times daily -  with meals and at bedtime. Take tablet or liquid but not both 120 tablet 1  . SYNTHROID 150 MCG tablet TAKE 1 TABLET (150 MCG TOTAL) BY MOUTH DAILY BEFORE BREAKFAST 90 tablet 0  . tamsulosin (FLOMAX) 0.4 MG CAPS capsule TAKE ONE CAPSULE BY MOUTH EVERY DAY 90 capsule 3  . sucralfate (CARAFATE) 1 GM/10ML suspension Take 10 mLs (1 g total) by mouth 4 (four) times daily -  with meals and at bedtime. Take liquid or tablets but not both 420 mL 0   No facility-administered medications prior to visit.     No Known Allergies  Review of Systems  Constitutional: Positive for malaise/fatigue. Negative for chills and fever.  HENT: Negative for congestion and hearing loss.   Eyes: Negative for discharge.  Respiratory: Negative for cough, sputum production and shortness of breath.   Cardiovascular: Negative for chest pain, palpitations and leg swelling.  Gastrointestinal: Negative for abdominal pain, blood in stool, constipation, diarrhea, heartburn,  nausea and vomiting.  Genitourinary: Negative for dysuria, frequency, hematuria and urgency.  Musculoskeletal: Negative for back pain, falls and myalgias.  Skin: Negative for rash.  Neurological: Positive for focal weakness and weakness. Negative for dizziness, sensory change, loss of consciousness and headaches.  Endo/Heme/Allergies: Negative for environmental allergies. Does not bruise/bleed easily.  Psychiatric/Behavioral: Positive for depression. Negative for suicidal ideas. The patient is not nervous/anxious and does not have insomnia.        Objective:    Physical Exam  Constitutional: He is oriented to person, place, and time. He appears well-developed and well-nourished. No distress.  HENT:  Head: Normocephalic and atraumatic.  Nose: Nose normal.  Eyes: Right eye exhibits no discharge. Left eye exhibits no discharge.  Neck: Normal range of motion. Neck supple.  Cardiovascular: Normal rate and regular rhythm.  No murmur heard. Pulmonary/Chest: Effort normal and breath sounds normal.  Abdominal: Soft. Bowel sounds are normal. There is no tenderness.  Musculoskeletal: He exhibits no edema.  Neurological: He is alert and oriented to person, place, and time. He displays abnormal reflex. He exhibits abnormal muscle tone. Coordination abnormal.  Skin: Skin is warm and dry.  Right parietal lesion, irregular shape, multicolored aised  Psychiatric: He has a normal mood and affect.  Nursing note and vitals reviewed.   BP 112/62 (BP Location: Left Arm, Patient Position: Sitting, Cuff Size: Large)   Pulse 91   Temp 98.8 F (37.1 C) (Oral)   Resp 16   Ht 6\' 3"  (1.905 m)   Wt 286 lb (129.7 kg)   SpO2 99%   BMI 35.75 kg/m  Wt Readings from Last 3 Encounters:  07/30/18 286 lb (129.7 kg)  05/28/18 283 lb (128.4 kg)  02/19/18 282 lb (127.9 kg)     Lab Results  Component Value Date   WBC 7.6 07/30/2018   HGB 14.5 07/30/2018   HCT 42.6 07/30/2018   PLT 190.0 07/30/2018    GLUCOSE 90 07/30/2018   CHOL 160 07/30/2018   TRIG 52.0 07/30/2018   HDL 59.00 07/30/2018   LDLCALC 91 07/30/2018   ALT 28 07/30/2018   AST 16 07/30/2018   NA 141 07/30/2018   K 4.3 07/30/2018   CL 103 07/30/2018   CREATININE 0.94 07/30/2018   BUN 19 07/30/2018   CO2 34 (H) 07/30/2018   TSH 1.63 07/30/2018   PSA 0.52 02/11/2016   INR 1.00 04/21/2011    Lab Results  Component Value Date   TSH 1.63 07/30/2018   Lab Results  Component Value Date   WBC 7.6 07/30/2018   HGB 14.5 07/30/2018   HCT 42.6 07/30/2018   MCV 87.7 07/30/2018   PLT 190.0 07/30/2018   Lab Results  Component Value Date   NA 141 07/30/2018   K 4.3 07/30/2018   CO2 34 (H) 07/30/2018   GLUCOSE 90 07/30/2018   BUN 19 07/30/2018   CREATININE 0.94 07/30/2018   BILITOT 0.6 07/30/2018   ALKPHOS 64 07/30/2018   AST 16 07/30/2018   ALT 28 07/30/2018   PROT 6.8 07/30/2018   ALBUMIN 4.5 07/30/2018   CALCIUM 9.6 07/30/2018   ANIONGAP 10 12/07/2015   GFR 92.06 07/30/2018   Lab Results  Component Value Date   CHOL 160 07/30/2018   Lab Results  Component Value Date   HDL 59.00 07/30/2018   Lab Results  Component Value Date   LDLCALC 91 07/30/2018   Lab Results  Component Value Date   TRIG 52.0 07/30/2018   Lab Results  Component Value Date   CHOLHDL 3 07/30/2018   No results found for: HGBA1C     Assessment & Plan:   Problem List Items Addressed This Visit    Multiple sclerosis (HCC)    They are just completing a renovation of his house so it will be handicap accesible.       Hyperlipidemia    Encouraged heart healthy diet, increase exercise, avoid trans fats, consider a krill oil cap daily      Hypothyroidism    On Levothyroxine, continue to monitor      Relevant Orders   TSH (Completed)   HTN (hypertension) - Primary    Well controlled, no changes to meds. Encouraged heart healthy diet such as the DASH diet and exercise as tolerated.       Relevant Orders   CBC  (Completed)   Comprehensive metabolic panel (Completed)   Lipid panel (Completed)   Obesity    Encouraged DASH diet, decrease po intake and increase exercise as tolerated. Needs 7-8 hours of sleep nightly. Avoid trans fats, eat small, frequent meals every 4-5 hours with lean proteins, complex carbs and healthy fats. Minimize simple carbs, considering referral to healthy weight and wellness.       Preventative health care    Patient encouraged to maintain heart healthy diet, regular exercise, adequate sleep. Consider daily probiotics. Take medications as prescribed. Labs reviewed.       Fatigue   Low testosterone    Check level today      BRBPR (bright red blood per rectum)   Relevant Orders   Ambulatory referral to Gastroenterology   Skin lesion of scalp    right parietal region  referred to dermatology for further consideration.      Relevant Orders   Ambulatory referral to Dermatology    Other Visit Diagnoses    Low testosterone in male       Relevant Orders   Testosterone (Completed)      I am having [name] maintain his cetirizine, aspirin EC, ibuprofen, BACLOFEN IT, natalizumab, Clonidine HCl, clotrimazole-betamethasone, nitroGLYCERIN, olmesartan, clonazePAM, oseltamivir, clobetasol cream, gabapentin, atorvastatin, dalfampridine, olmesartan, tamsulosin, ranitidine, nystatin cream, omeprazole, sucralfate, SYNTHROID, CYMBALTA, cyclobenzaprine, methylphenidate, and sucralfate.  Meds ordered this encounter  Medications  . sucralfate (CARAFATE) 1 GM/10ML suspension    Sig: Take 10 mLs (1 g total) by mouth 4 (four) times daily -  with meals and at bedtime. Take liquid or tablets but not both    Dispense:  420 mL    Refill:  1     [name], MD

## 2018-08-03 ENCOUNTER — Other Ambulatory Visit: Payer: Self-pay | Admitting: Neurology

## 2018-08-03 MED ORDER — SYNTHROID 150 MCG PO TABS: 150.0000 ug | ORAL_TABLET | Freq: Every day | ORAL | 0 refills | Status: DC

## 2018-08-13 ENCOUNTER — Ambulatory Visit (INDEPENDENT_AMBULATORY_CARE_PROVIDER_SITE_OTHER): Payer: 59 | Admitting: Psychology

## 2018-08-13 DIAGNOSIS — F4323 Adjustment disorder with mixed anxiety and depressed mood: Secondary | ICD-10-CM

## 2018-08-23 ENCOUNTER — Encounter: Payer: Self-pay | Admitting: Neurology

## 2018-08-23 ENCOUNTER — Telehealth: Payer: Self-pay | Admitting: Neurology

## 2018-08-23 ENCOUNTER — Ambulatory Visit (INDEPENDENT_AMBULATORY_CARE_PROVIDER_SITE_OTHER): Payer: Managed Care, Other (non HMO) | Admitting: Neurology

## 2018-08-23 ENCOUNTER — Other Ambulatory Visit: Payer: Self-pay

## 2018-08-23 VITALS — BP 131/80 | HR 76 | Resp 20 | Ht 75.0 in | Wt 286.0 lb

## 2018-08-23 DIAGNOSIS — G35 Multiple sclerosis: Secondary | ICD-10-CM | POA: Diagnosis not present

## 2018-08-23 DIAGNOSIS — R5383 Other fatigue: Secondary | ICD-10-CM

## 2018-08-23 DIAGNOSIS — R252 Cramp and spasm: Secondary | ICD-10-CM | POA: Diagnosis not present

## 2018-08-23 DIAGNOSIS — R3915 Urgency of urination: Secondary | ICD-10-CM

## 2018-08-23 DIAGNOSIS — R269 Unspecified abnormalities of gait and mobility: Secondary | ICD-10-CM

## 2018-08-23 DIAGNOSIS — M25512 Pain in left shoulder: Secondary | ICD-10-CM | POA: Insufficient documentation

## 2018-08-23 DIAGNOSIS — Z79899 Other long term (current) drug therapy: Secondary | ICD-10-CM

## 2018-08-23 NOTE — Telephone Encounter (Signed)
[employer] order sent to [facility]. They will obtain the auth and will reach out to the pt to schedule.  °

## 2018-08-23 NOTE — Progress Notes (Signed)
HPI    Multiple Sclerosis     Additional comments: Skin prepped with Betadine and draped in sterile fashion.  Center port of implanted pump accessed using Huber needle. Residual fluid measuring 12ml removed from reservoir.  Medication from [facility] instilled into pump.  Pump reprogrammed to reflect refill volume of 40ml. New alarm date is 04/15/19.  Appt. made for next refill 02/20/18.  ERI 33 mos/fim        Last edited by Misenheimer, Magnus Sinning, RN on 08/23/2018  3:42 PM. (History)       Chief Complaint    Multiple Sclerosis      HISTORY:  Keith Morrow is a 47 y.o. man with a relapsing form of multiple sclerosis who has right greater than left leg weakness and progressive difficulties with gait function.  Due to severe spasticity, a baclofen pump was placed in 2013.  Update 08/23/2018: He denies any exacerbations but has noted a little more weakness in his legs.  He is on Tysabri 300 mg every 4 weeks and he tolerates it well.   The last JCV antibody titer was indeterminate at 0.2 but the inhibition assay was negative.  His main problem continues to be leg weakness and poor gait.  With a walker, he can use the walker about 100 feet without a stop.  Ampyra has helped and he noted getting worse in the day that he did not take his dose.  Due to the weakness in his legs he uses the arms quite a bit to help with his gait.  He sees physical therapy on a weekly basis.  Due to spasticity he is on a baclofen pump.  He is on a combination of baclofen and clonidine and the combination has helped more than baclofen alone.  He will occasionally get some phasic spasms, sometimes while walking and sometimes on getting into the car.  They do not occur at night.  He continues on a basal rate with boluses at 11 PM and 2 AM.   His dysesthesias in the chest are painful at times and gabapentin has helped.  He also gets some leg numbness at times.  He has some urinary frequency and rare urge incontinence.   Has  some fatigue helped by Ritalin.  Update 02/15/2018:    He feels that his MS is stable and he has no further exacerbations.  He is on Tysabri.   He tolerates it well. We need to recheck the JCV Ab. It has been negative so far. He feels his gait is doing about the same. With the walker he is able to get between his core in the office Around the house he usually does not have much trouble.   The baclofen pump has helped. He continues on a combination of  Baclofen and clonidine. He does note that he has more spasticity in the late evening and when he lays down at night.    Currently, he has a basal rate and two boluses at 11 PM and at 2 AM.   His other MS symptoms are doing about the same   08/24/2017: MS:   He is currently on Tysabri. He tolerates it well and there has been no definite exacerbations.    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                       Gait:   He is able to use a Rollator walker around the house  and at work much of the time. However, he requires the electric wheelchair at other times. He is doing physical therapy once a week.  He is unable to walk without the Rollator due to the weakness in his legs, right greater than left.   Spasticity:   Before the baclofen pump, he would get both tonic and phasic spasms in the legs that would often be uncomfortable.   His spasticity is improved with the combination of baclofen and clonidine in his pump.  He has not had any recent episodes of his legs locking up. His legs are easily bent. He occasionally will get a mild cramping sensation in the right thigh. This occurs if he sits a long time. It is not associated with actual spasticity worsening.   PHYSICAL EXAM  General: The patient is well-developed and well-nourished and in no acute distress.  Extremities:   There are no rashes or edema.  He has mild tenderness over the left subacromial bursa and some pain upon external rotation and elevation  Neurologic Exam  Mental status: The  patient is alert and oriented x 3 at the time of the examination. Intact attention and recall.   Speech is normal.  CN:   Extraocular muscles are normal.  No ptosis.    Symmetric facial strength and sensation.    PE/TP midline  Motor: Muscle bulk is normal.tone is mildly increased in the right arm and minimally increased in the legs. Strength is 4++/ 5 in the right arm, 5/5 in the left arm, 2+/5 in the right leg 3 to 4-/5 on the left.   Gait and station: Requires support to stand.  He is using a Rollator type walker.  There is some foot drop.  He cannot tandem walk  Reflexes: Deep tendon reflexes are trace at the knees.No ankleclonus   ASSESSMENT  Multiple sclerosis (HCC) - Plan: CBC with Differential/Platelet, Stratify JCV Antibody Test ([facility]), MR BRAIN W WO CONTRAST, MR CERVICAL SPINE W WO CONTRAST  Gait disturbance  Spasticity  Urinary urgency  Fatigue, unspecified type  Left shoulder pain, unspecified chronicity  High risk medication use - Plan: CBC with Differential/Platelet, Stratify JCV Antibody Test ([facility])    PLAN: 1.  The intrathecal pump will be refilled with 3600 mcg/mL baclofen and 720 mcg/mL clonidine.   We will continue the current basal rate and intermittent boluses. 2.   Will continue Tysabri every 4 weeks.  We will recheck the JCV antibody today.  If he does not convert to a JCV Ab middle or high positive titer, we will consider Ocrevus or Lemtrada..  3.   Continue other med's  4.   Advised to stay active and try to lose weight if possible. 5.   Return as scheduled or sooner if there are new or worsening neurologic issues.  We can make further adjustments if needed.  Siarah Deleo A. Epimenio Foot, MD, PhD, FAAN Certified in Neurology, Clinical Neurophysiology, Sleep Medicine, Pain Medicine and Neuroimaging Director, [facility] at [facility]  [facility] [address], [address] [place], [place] [address] [phone]

## 2018-08-23 NOTE — Progress Notes (Signed)
Placed JCV lab in [facility] lock box for routine lab pick up.  

## 2018-08-24 LAB — CBC WITH DIFFERENTIAL/PLATELET
Basophils Absolute: 0.1 10*3/uL (ref 0.0–0.2)
Basos: 1 %
EOS (ABSOLUTE): 0.4 10*3/uL (ref 0.0–0.4)
Eos: 5 %
Hematocrit: 39.8 % (ref 37.5–51.0)
Hemoglobin: 13.3 g/dL (ref 13.0–17.7)
Immature Grans (Abs): 0.1 10*3/uL (ref 0.0–0.1)
Immature Granulocytes: 1 %
Lymphocytes Absolute: 2.4 10*3/uL (ref 0.7–3.1)
Lymphs: 30 %
MCH: 29.2 pg (ref 26.6–33.0)
MCHC: 33.4 g/dL (ref 31.5–35.7)
MCV: 88 fL (ref 79–97)
Monocytes Absolute: 0.6 10*3/uL (ref 0.1–0.9)
Monocytes: 8 %
Neutrophils Absolute: 4.4 10*3/uL (ref 1.4–7.0)
Neutrophils: 55 %
Platelets: 190 10*3/uL (ref 150–450)
RBC: 4.55 x10E6/uL (ref 4.14–5.80)
RDW: 15 % (ref 12.3–15.4)
WBC: 7.9 10*3/uL (ref 3.4–10.8)

## 2018-08-26 ENCOUNTER — Telehealth: Payer: Self-pay | Admitting: Neurology

## 2018-08-26 NOTE — Telephone Encounter (Signed)
Pt called stating he feels he is having a relapse, stating he is experiencing new numbness(left foot, left lower leg, left side of chest) since yesterday

## 2018-08-26 NOTE — Telephone Encounter (Signed)
Spoke with Keith Morrow. He is in [place] for business today.  Sts. yesterday afternoon, he noted numbness/tingling of 3rd, 4th, 5th toes on his left foot, progressing up left outer leg, and numbness ant. left shoulder, chest and down left abd to navel. Same to slightly worse since onset. Sts. not sure if, since first noting sx. they have actually gotten worse or he is just thinking about them more. Spoke with RAS. Pt. is sched. for Ty infusion tomorrow and will also give him SM 1gm IV at infusion, and may add a 2nd day of IV SM if needed. Pt. agreeable with this plan. Orders given to Tina./fim

## 2018-08-27 ENCOUNTER — Ambulatory Visit (INDEPENDENT_AMBULATORY_CARE_PROVIDER_SITE_OTHER): Payer: 59 | Admitting: Psychology

## 2018-08-27 DIAGNOSIS — F4323 Adjustment disorder with mixed anxiety and depressed mood: Secondary | ICD-10-CM | POA: Diagnosis not present

## 2018-08-27 NOTE — Telephone Encounter (Signed)
Keith Morrow is here today for his Tysabri infusion and I will have him get 1 g of of IV Solu-Medrol and consider more if he is not better early next week.  The distribution of his symptoms is unusual.  He has significant numbness in the lateral left leg splitting the foot with numbness in the fourth and fifth toes but no numbness in the first and second.  This would be most consistent with an S1 process.  However, he also has numbness that extended into the chest to below the nipple on the left arm (worrisome for MS plaque or spinal process around T5 or T6.  He has old dysesthetic sensations in the chest on the right.  If he is not better on Monday, I would consider getting an MRI of the thoracic and/or lumbar spine to better evaluate this.

## 2018-08-30 ENCOUNTER — Encounter: Payer: Self-pay | Admitting: *Deleted

## 2018-08-30 NOTE — Telephone Encounter (Signed)
Spoke with Keith Morrow this am. He is coming in for day 2 of IV SM this afternoon. Sts. yesterday tingling in both feet was worse, painful even. Today numbness in left foot is resolved and tingling is improved but still present/fim

## 2018-08-30 NOTE — Telephone Encounter (Signed)
Pt is wanting to discuss what is the next step since IV Solum-Medrol? He can be reached at [phone]

## 2018-09-07 ENCOUNTER — Encounter: Payer: Self-pay | Admitting: Gastroenterology

## 2018-09-07 ENCOUNTER — Encounter

## 2018-09-07 ENCOUNTER — Ambulatory Visit (INDEPENDENT_AMBULATORY_CARE_PROVIDER_SITE_OTHER): Payer: Managed Care, Other (non HMO) | Admitting: Gastroenterology

## 2018-09-07 VITALS — BP 100/70 | HR 104 | Ht 73.0 in | Wt 280.5 lb

## 2018-09-07 DIAGNOSIS — K625 Hemorrhage of anus and rectum: Secondary | ICD-10-CM | POA: Diagnosis not present

## 2018-09-07 DIAGNOSIS — K5909 Other constipation: Secondary | ICD-10-CM | POA: Diagnosis not present

## 2018-09-07 NOTE — Patient Instructions (Signed)
If you are age 45 or older, your body mass index should be between 23-30. Your Body mass index is 37.01 kg/m. If this is out of the aforementioned range listed, please consider follow up with your Primary Care Provider.  If you are age 18 or younger, your body mass index should be between 19-25. Your Body mass index is 37.01 kg/m. If this is out of the aformentioned range listed, please consider follow up with your Primary Care Provider.   It was a pleasure to see you today!  Dr. Myrtie Neither

## 2018-09-07 NOTE — Progress Notes (Signed)
[facility] Progress Note  Chief Complaint: rectal bleeding  Subjective  History:  Last seen June 2018 for GERD - EGD normal except small hiatal hernia.  Keith Morrow was sent by Dr. Rogelia Rohrer for recent constipation and rectal bleeding.  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  He recently had a more severe episode of constipation with large stool and the need to strain, followed by anorectal pain and bleeding for several days.  He admits that he had cut back his fluid intakes that he would not have to urinate so frequently because of his limited mobility.  Since then, he has been better about fluid intake and getting more fruits and vegetables and dietary fiber.  His constipation has relieved and he has not had bleeding for about 2 weeks. He occasionally has abdominal wall pain from his MS.  He has not had nausea or vomiting or loss of appetite. Keith Morrow notes that his strength and mobility have been decreasing steadily, and he is noticeably different than when I saw him before.  ROS: Cardiovascular:  no chest pain Respiratory: no dyspnea Appetite has generally been good without weight loss. Muscle weakness, muscle spasms as before.  The patient's Past Medical, Family and Social History were reviewed and are on file in the EMR.  Objective:  Med list reviewed  Current Outpatient Medications:  .  aspirin EC 81 MG tablet, Take 81 mg by mouth daily., Disp: , Rfl:  .  atorvastatin (LIPITOR) 40 MG tablet, Take 1 tablet (40 mg total) by mouth daily at 6 PM., Disp: 90 tablet, Rfl: 3 .  BACLOFEN IT, 1 Dose by Intrathecal route as directed. , Disp: , Rfl:  .  cetirizine (ZYRTEC) 10 MG tablet, Take 10 mg by mouth daily as needed for allergies. , Disp: , Rfl:  .  clobetasol cream (TEMOVATE) 0.05 %, Apply 1 application topically 2 (two) times daily as needed (skin)., Disp: ,  Rfl:  .  clonazePAM (KLONOPIN) 1 MG tablet, TAKE 1 TABLET BY MOUTH 4 TIMES A DAY, Disp: 360 tablet, Rfl: 1 .  Clonidine HCl POWD, by Does not apply route. 720mg /ml IT Baclofen. Total dose 157.89mg /day. Last pump refill 06/02/16, Disp: , Rfl:  .  clotrimazole-betamethasone (LOTRISONE) cream, Apply 1 application topically 2 (two) times daily as needed (anti-fungal)., Disp: 30 g, Rfl: 2 .  cyclobenzaprine (FLEXERIL) 10 MG tablet, Take 1 tablet (10 mg total) by mouth 3 (three) times daily as needed for muscle spasms., Disp: 90 tablet, Rfl: 11 .  CYMBALTA 60 MG capsule, Take 1 capsule (60 mg total) by mouth 2 (two) times daily., Disp: 180 capsule, Rfl: 1 .  dalfampridine (AMPYRA) 10 MG TB12, Take 1 tablet (10 mg total) by mouth every 12 (twelve) hours., Disp: 180 tablet, Rfl: 3 .  gabapentin (NEURONTIN) 300 MG capsule, Take 300-600 mg by mouth 3 (three) times daily. 300 mg by mouth morning and afternoon 600 mg by mouth at bedtime, Disp: , Rfl:  .  ibuprofen (ADVIL,MOTRIN) 200 MG tablet, Take 200 mg by mouth every 6 (six) hours as needed for moderate pain., Disp: , Rfl:  .  methylphenidate (RITALIN) 10 MG tablet, Take 1 tablet (10 mg total) by mouth 4 (four) times daily., Disp: 120 tablet, Rfl: 0 .  natalizumab (TYSABRI) 300 MG/15ML injection, Inject 300 mg into the vein every 30 (thirty) days.,  Disp: , Rfl:  .  nitroGLYCERIN (NITROSTAT) 0.4 MG SL tablet, PLACE 1 TABLET UNDER THE TOUNGE EVERY 5 MINUTES AS NEEDED FOR CHEST PAIN (X 3 DOSES), Disp: 25 tablet, Rfl: 0 .  nystatin cream (MYCOSTATIN), Apply 1 application topically as directed., Disp: 30 g, Rfl: 2 .  olmesartan (BENICAR) 20 MG tablet, Take 1 tablet (20 mg total) by mouth daily., Disp: 90 tablet, Rfl: 2 .  omeprazole (PRILOSEC) 40 MG capsule, Take 1 capsule (40 mg total) by mouth daily., Disp: 90 capsule, Rfl: 1 .  ranitidine (ZANTAC) 300 MG tablet, TAKE 1 TABLET BY MOUTH EVERYDAY AT BEDTIME, Disp: 90 tablet, Rfl: 1 .  sucralfate (CARAFATE) 1  GM/10ML suspension, Take 10 mLs (1 g total) by mouth 4 (four) times daily -  with meals and at bedtime. Take liquid or tablets but not both, Disp: 420 mL, Rfl: 1 .  SYNTHROID 150 MCG tablet, Take 1 tablet (150 mcg total) by mouth daily before breakfast., Disp: 90 tablet, Rfl: 0 .  tamsulosin (FLOMAX) 0.4 MG CAPS capsule, TAKE ONE CAPSULE BY MOUTH EVERY DAY, Disp: 90 capsule, Rfl: 3   Vital signs in last 24 hrs: Vitals:   09/07/18 1555  BP: 100/70  Pulse: (!) 104    Physical Exam  Restricted affect.  Mobility limited, he was leaning against exam table, and I helped him get up to a laying position, then to medically turning to the side for an exam and back up again.  He would not of been able to do this by himself.  HEENT: sclera anicteric, oral mucosa moist without lesions  Neck: supple, no thyromegaly, JVD or lymphadenopathy  Cardiac: RRR without murmurs, S1S2 heard, no peripheral edema  Pulm: clear to auscultation bilaterally, normal RR and effort noted  Abdomen: soft, no tenderness, with active bowel sounds. No guarding or palpable hepatosplenomegaly.  Skin; warm and dry, no jaundice or rash Stool: Mild erythema of perianal skin.  Decreased resting and voluntary sphincter tone on digital exam,, limited exam by positioning.  No palpable internal lesions in rectal vault.  No fissure felt or tenderness.   Data:  CBC Latest Ref Rng & Units 08/23/2018 07/30/2018 02/15/2018  WBC 3.4 - 10.8 x10E3/uL 7.9 7.6 7.1  Hemoglobin 13.0 - 17.7 g/dL 16.2 44.6 95.0  Hematocrit 37.5 - 51.0 % 39.8 42.6 40.6  Platelets 150 - 450 x10E3/uL 190 190.0 191    @ASSESSMENTPLANBEGIN @ Assessment: Encounter Diagnoses  Name Primary?  Marland Kitchen Anal bleeding Yes  . Other constipation    Chronic constipation with a recent more severe episode that then caused some benign anal bleeding.  He probably had a tear of the anal mucosa with pain and bleeding, all of which is now healed.  Plan: This will be a challenge  for him due to his MS.  He will try to consume sufficient dietary fiber and fluids to avoid constipation. He has not had a bowel movement in 2 days, advised him to take a capful of MiraLAX in the evening to hopefully relieve constipation and prevent an episode like he recently had. I do not think a colonoscopy is necessary at this juncture.   Total time 20 minutes, over half spent face-to-face with patient in counseling and coordination of care.   Charlie Pitter III

## 2018-09-08 ENCOUNTER — Telehealth: Payer: Self-pay | Admitting: *Deleted

## 2018-09-08 NOTE — Telephone Encounter (Signed)
Received Physician Orders from [facility]; forwarded to provider/SLS 10/02

## 2018-09-10 ENCOUNTER — Telehealth: Payer: Self-pay | Admitting: *Deleted

## 2018-09-10 ENCOUNTER — Ambulatory Visit (INDEPENDENT_AMBULATORY_CARE_PROVIDER_SITE_OTHER): Payer: 59 | Admitting: Psychology

## 2018-09-10 DIAGNOSIS — F4323 Adjustment disorder with mixed anxiety and depressed mood: Secondary | ICD-10-CM | POA: Diagnosis not present

## 2018-09-10 NOTE — Telephone Encounter (Signed)
Received Physician Orders from [facility];  forwarded to provider/SLS 10/04

## 2018-09-13 ENCOUNTER — Other Ambulatory Visit: Payer: Self-pay | Admitting: *Deleted

## 2018-09-13 DIAGNOSIS — G35 Multiple sclerosis: Secondary | ICD-10-CM

## 2018-09-13 NOTE — Telephone Encounter (Signed)
Faxed the Medtronic information to [place] at [facility] cone they will review it and contact the patient to schedule.  I did contact the patient and updated him on everything and informed him once the MRI has been schedule to come to [facility] for it to be checked.

## 2018-09-13 NOTE — Telephone Encounter (Signed)
Rutherford Nail: [id number] (exp. 09/13/18 to 12/12/18)  Due to the patient have a Medtroinc the patient will have to go to the hospital.. When you get a chance can you put a new order in for [facility]. Thank you  Pump Brand Name: Medtronic Model: [id number] Serial number: [id number] [facility] Medtronic # [id number]

## 2018-09-14 NOTE — Telephone Encounter (Signed)
Noted/fim 

## 2018-09-14 NOTE — Telephone Encounter (Signed)
Just an FYI  Patient MRI's are scheduled for Monday 09/27/18 his appt is at 1:00 pm. Once he is done having his MRI at [facility] he does know to come to [facility] to get his pump checked.

## 2018-09-23 MED ORDER — METHYLPHENIDATE HCL 10 MG PO TABS: 10.0000 mg | ORAL_TABLET | Freq: Four times a day (QID) | ORAL | 0 refills | Status: DC

## 2018-09-23 NOTE — Addendum Note (Signed)
Addended by: Candis Schatz I on: 09/23/2018 02:05 PM   Modules accepted: Orders

## 2018-09-24 ENCOUNTER — Ambulatory Visit (HOSPITAL_COMMUNITY): Payer: Self-pay

## 2018-09-27 ENCOUNTER — Telehealth: Payer: Self-pay | Admitting: Neurology

## 2018-09-27 ENCOUNTER — Ambulatory Visit (HOSPITAL_COMMUNITY)
Admission: RE | Admit: 2018-09-27 | Discharge: 2018-09-27 | Disposition: A | Discharge status: Home / Self Care | Payer: Managed Care, Other (non HMO) | Source: Ambulatory Visit | Attending: Neurology | Admitting: Neurology

## 2018-09-27 ENCOUNTER — Ambulatory Visit (HOSPITAL_COMMUNITY): Payer: Managed Care, Other (non HMO)

## 2018-09-27 ENCOUNTER — Ambulatory Visit (INDEPENDENT_AMBULATORY_CARE_PROVIDER_SITE_OTHER): Payer: Managed Care, Other (non HMO) | Admitting: Psychology

## 2018-09-27 DIAGNOSIS — F4323 Adjustment disorder with mixed anxiety and depressed mood: Secondary | ICD-10-CM | POA: Diagnosis not present

## 2018-09-27 DIAGNOSIS — G35 Multiple sclerosis: Secondary | ICD-10-CM | POA: Insufficient documentation

## 2018-09-27 LAB — POCT I-STAT CREATININE: Creatinine, Ser: 0.8 mg/dL (ref 0.61–1.24)

## 2018-09-27 MED ORDER — GADOBUTROL 1 MMOL/ML IV SOLN
10.0000 mL | Freq: Once | INTRAVENOUS | Status: AC | PRN
  Administered 2018-09-27: 10 mL via INTRAVENOUS

## 2018-09-29 ENCOUNTER — Telehealth: Payer: Self-pay | Admitting: *Deleted

## 2018-09-29 NOTE — Telephone Encounter (Signed)
-----   Message from Hillis Range, RN sent at 09/29/2018  8:15 AM EDT -----   ----- Message ----- From: Asa Lente, MD Sent: 09/28/2018   6:16 PM EDT To: Hillis Range, RN  Please let him know that particular at his new MRIs to compare them side-by-side with the old ones.  The MRI of the cervical spine shows multiple MS plaques but there are no new ones compared to the 2016 MRI.     The MRI of the brain showed a single small new spot in the left temporal lobe that was not present in 2017.  All the other lesions were present on the older scan.  One small new lesion over a couple years is ok.

## 2018-09-29 NOTE — Telephone Encounter (Signed)
I called patient and went over results per Dr. Epimenio Foot note. He verbalized understanding and appreciation.  He states when he is driving for awhile (3 hrs) he noticed discomfort in feet. Advised it may be d/t long period of driving he is doing. Make sure he is taking breaks and stretching. He will let us know if this worsens or if he has new sx.

## 2018-09-29 NOTE — Telephone Encounter (Signed)
Error

## 2018-10-01 ENCOUNTER — Encounter: Payer: Self-pay | Admitting: Family Medicine

## 2018-10-01 ENCOUNTER — Ambulatory Visit (INDEPENDENT_AMBULATORY_CARE_PROVIDER_SITE_OTHER): Payer: Managed Care, Other (non HMO) | Admitting: Family Medicine

## 2018-10-01 VITALS — BP 122/74 | HR 94 | Temp 98.0°F | Resp 16 | Ht 73.0 in | Wt 283.0 lb

## 2018-10-01 DIAGNOSIS — E785 Hyperlipidemia, unspecified: Secondary | ICD-10-CM | POA: Diagnosis not present

## 2018-10-01 DIAGNOSIS — F418 Other specified anxiety disorders: Secondary | ICD-10-CM

## 2018-10-01 DIAGNOSIS — Z23 Encounter for immunization: Secondary | ICD-10-CM

## 2018-10-01 DIAGNOSIS — I10 Essential (primary) hypertension: Secondary | ICD-10-CM | POA: Diagnosis not present

## 2018-10-01 DIAGNOSIS — E039 Hypothyroidism, unspecified: Secondary | ICD-10-CM | POA: Diagnosis not present

## 2018-10-01 DIAGNOSIS — G35 Multiple sclerosis: Secondary | ICD-10-CM

## 2018-10-01 MED ORDER — SUCRALFATE 1 G PO TABS: 1.0000 g | ORAL_TABLET | Freq: Three times a day (TID) | ORAL | 1 refills | Status: DC

## 2018-10-01 NOTE — Patient Instructions (Signed)

## 2018-10-03 NOTE — Assessment & Plan Note (Signed)
He continues to loose strength and mobility and has come to the conclusion he will be wheelchair bound from here on out.

## 2018-10-03 NOTE — Progress Notes (Signed)
Subjective:    Patient ID: Keith Morrow, male    DOB: [birth year], 47 y.o.   MRN: [id number]  Chief Complaint  Patient presents with  . Hypertension    2 month follow up, cymbalta 90 day daw refilled    HPI Patient is in today for follow up. No recent febrile illness or hospitalizations. He is tearful during the exam due to his worsening MS and weakness. He had hoped to 2 years since he was told he would be wheelchair-bound in 3 to 6 months before he had to use his wheelchair.  He is just under 2 months shy of that goal and frustrated.  His health is otherwise good and he has no other acute complaints continues to have reflux and abdominal discomfort is asking for refill on Carafate which she finds helpful at times. Denies CP/palp/SOB/HA/congestion/fevers or GU c/o. Taking meds as prescribed  Past Medical History:  Diagnosis Date  . Acute bronchitis 05/09/2013  . Allergy   . Bronchitis, acute 12/10/2011  . Candidal skin infection 05/05/2017  . Chicken pox as a child  . Constipation 02/20/2017  . Coronary atherosclerosis of native coronary artery    a. NSTEMI 04/20/11: tx with Promus DES to LAD; bifurcation lesion with oDx 90% tx with POBA; b. staged PCI of right post. AV Branch with Promus DES; c. residual at cath 04/21/11:  AV groove CFX 50%, mRCA 30%,; EF 55%  . Depression   . Depression with anxiety   . Dermatitis, contact 12/10/2011  . Fatigue 10/15/2012  . Heart attack (HCC) 04-20-11  . HTN (hypertension)   . Hyperlipidemia   . Hypotestosteronism 11/17/2011  . Hypothyroidism   . Keloid scar   . MS (multiple sclerosis) (HCC)   . Neuromuscular disorder (HCC)    NUMBNESS/TINGLING  . Obesity   . Onychomycosis   . Preventative health care 10/07/2011  . Reflux 10/07/2011  . Sun-damaged skin 02/17/2016  . Testosterone deficiency 01/16/2012  . Tinea pedis   . 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 Visual changes 11/13/2016    Past Surgical History:  Procedure Laterality Date    . CORONARY ANGIOPLASTY WITH STENT PLACEMENT     2012  . PAIN PUMP IMPLANTATION N/A 08/29/2014   Procedure: Baclofen pump placement;  Surgeon: Maeola Harman, MD;  Location: [facility];  Service: Neurosurgery;  Laterality: N/A;  Baclofen pump placement  . PROGRAMABLE BACLOFEN PUMP REVISION  08/29/14  . right wrist surgery     CTR  . VASCULAR SURGERY     vericose vein in right femoral area  . VASECTOMY    . WISDOM TOOTH EXTRACTION      Family History  Problem Relation Age of Onset  . Heart attack Mother 80  . Thyroid disease Mother   . Heart disease Mother   . Thyroid disease Sister   . Thyroid disease Brother   . Heart disease Maternal Grandmother   . Hypertension Maternal Grandmother   . Hyperlipidemia Maternal Grandmother   . Heart disease Maternal Grandfather   . Hypertension Maternal Grandfather   . Hyperlipidemia Maternal Grandfather   . Stroke Paternal Grandfather   . Thyroid disease Sister   . Heart disease Father        2 stents  . Coronary artery disease Unknown        questionable in father    Social History   Socioeconomic History  . Marital status: Married    Spouse name: Not on file  .  Number of children: 3  . Years of education: Not on file  . Highest education level: Not on file  Occupational History  . Not on file  Social Needs  . Financial resource strain: Not on file  . Food insecurity:    Worry: Not on file    Inability: Not on file  . Transportation needs:    Medical: Not on file    Non-medical: Not on file  Tobacco Use  . Smoking status: Never Smoker  . Smokeless tobacco: Never Used  Substance and Sexual Activity  . Alcohol use: Yes    Comment: socially  . Drug use: No  . 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 Physical activity:    Days per week: Not on file    Minutes per session: Not on file  . Stress: Not on file   Relationships  . Social connections:    Talks on phone: Not on file    Gets together: Not on file    Attends religious service: Not on file    Active member of club or organization: Not on file    Attends meetings of clubs or organizations: Not on file    Relationship status: Not on file  . Intimate partner violence:    Fear of current or ex partner: Not on file    Emotionally abused: Not on file    Physically abused: Not on file    Forced sexual activity: Not on file  Other Topics Concern  . Not on file  Social History Narrative  . Not on file    Outpatient Medications Prior to Visit  Medication Sig Dispense Refill  . aspirin EC 81 MG tablet Take 81 mg by mouth daily.    Marland Kitchen atorvastatin (LIPITOR) 40 MG tablet Take 1 tablet (40 mg total) by mouth daily at 6 PM. 90 tablet 3  . BACLOFEN IT 1 Dose by Intrathecal route as directed.     . cetirizine (ZYRTEC) 10 MG tablet Take 10 mg by mouth daily as needed for allergies.     . clobetasol cream (TEMOVATE) 0.05 % Apply 1 application topically 2 (two) times daily as needed (skin).    . clonazePAM (KLONOPIN) 1 MG tablet TAKE 1 TABLET BY MOUTH 4 TIMES A DAY 360 tablet 1  . Clonidine HCl POWD by Does not apply route. 720mg /ml IT Baclofen. Total dose 157.89mg /day. Last pump refill 06/02/16    . clotrimazole-betamethasone (LOTRISONE) cream Apply 1 application topically 2 (two) times daily as needed (anti-fungal). 30 g 2  . cyclobenzaprine (FLEXERIL) 10 MG tablet Take 1 tablet (10 mg total) by mouth 3 (three) times daily as needed for muscle spasms. 90 tablet 11  . CYMBALTA 60 MG capsule Take 1 capsule (60 mg total) by mouth 2 (two) times daily. 180 capsule 1  . dalfampridine (AMPYRA) 10 MG TB12 Take 1 tablet (10 mg total) by mouth every 12 (twelve) hours. 180 tablet 3  . gabapentin (NEURONTIN) 300 MG capsule Take 300-600 mg by mouth 3 (three) times daily. 300 mg by mouth morning and afternoon 600 mg by mouth at bedtime    . ibuprofen  (ADVIL,MOTRIN) 200 MG tablet Take 200 mg by mouth every 6 (six) hours as needed for moderate pain.    . methylphenidate (RITALIN) 10 MG tablet Take 1 tablet (10 mg total) by mouth 4 (four) times daily. 120 tablet  0  . natalizumab (TYSABRI) 300 MG/15ML injection Inject 300 mg into the vein every 30 (thirty) days.    . nitroGLYCERIN (NITROSTAT) 0.4 MG SL tablet PLACE 1 TABLET UNDER THE TOUNGE EVERY 5 MINUTES AS NEEDED FOR CHEST PAIN (X 3 DOSES) 25 tablet 0  . nystatin cream (MYCOSTATIN) Apply 1 application topically as directed. 30 g 2  . olmesartan (BENICAR) 20 MG tablet Take 1 tablet (20 mg total) by mouth daily. 90 tablet 2  . omeprazole (PRILOSEC) 40 MG capsule Take 1 capsule (40 mg total) by mouth daily. 90 capsule 1  . ranitidine (ZANTAC) 300 MG tablet TAKE 1 TABLET BY MOUTH EVERYDAY AT BEDTIME 90 tablet 1  . sucralfate (CARAFATE) 1 GM/10ML suspension Take 10 mLs (1 g total) by mouth 4 (four) times daily -  with meals and at bedtime. Take liquid or tablets but not both 420 mL 1  . SYNTHROID 150 MCG tablet Take 1 tablet (150 mcg total) by mouth daily before breakfast. 90 tablet 0  . tamsulosin (FLOMAX) 0.4 MG CAPS capsule TAKE ONE CAPSULE BY MOUTH EVERY DAY 90 capsule 3   No facility-administered medications prior to visit.     No Known Allergies  Review of Systems  Constitutional: Negative for fever and malaise/fatigue.  HENT: Negative for congestion.   Eyes: Negative for blurred vision.  Respiratory: Negative for shortness of breath.   Cardiovascular: Negative for chest pain, palpitations and leg swelling.  Gastrointestinal: Positive for heartburn. Negative for abdominal pain, blood in stool and nausea.  Genitourinary: Negative for dysuria and frequency.  Musculoskeletal: Positive for back pain and myalgias. Negative for falls.  Skin: Negative for rash.  Neurological: Negative for dizziness, loss of consciousness and headaches.  Endo/Heme/Allergies: Negative for environmental  allergies.  Psychiatric/Behavioral: Positive for depression. The patient is nervous/anxious.        Objective:    Physical Exam  Constitutional: He is oriented to person, place, and time. He appears well-developed and well-nourished. No distress.  HENT:  Head: Normocephalic and atraumatic.  Nose: Nose normal.  Eyes: Right eye exhibits no discharge. Left eye exhibits no discharge.  Neck: Normal range of motion. Neck supple.  Cardiovascular: Normal rate and regular rhythm.  No murmur heard. Pulmonary/Chest: Effort normal and breath sounds normal.  Abdominal: Soft. Bowel sounds are normal. There is no tenderness.  Musculoskeletal: He exhibits no edema.  Neurological: He is alert and oriented to person, place, and time. He exhibits abnormal muscle tone. Coordination abnormal.  Skin: Skin is warm and dry.  Psychiatric: He has a normal mood and affect.  Nursing note and vitals reviewed.   BP 122/74 (BP Location: Right Arm, Patient Position: Sitting, Cuff Size: Normal)   Pulse 94   Temp 98 F (36.7 C) (Oral)   Resp 16   Ht 6\' 1"  (1.854 m)   Wt 283 lb (128.4 kg)   SpO2 95%   BMI 37.34 kg/m  Wt Readings from Last 3 Encounters:  10/01/18 283 lb (128.4 kg)  09/07/18 280 lb 8 oz (127.2 kg)  08/23/18 286 lb (129.7 kg)     Lab Results  Component Value Date   WBC 7.9 08/23/2018   HGB 13.3 08/23/2018   HCT 39.8 08/23/2018   PLT 190 08/23/2018   GLUCOSE 90 07/30/2018   CHOL 160 07/30/2018   TRIG 52.0 07/30/2018   HDL 59.00 07/30/2018   LDLCALC 91 07/30/2018   ALT 28 07/30/2018   AST 16 07/30/2018   NA 141 07/30/2018  K 4.3 07/30/2018   CL 103 07/30/2018   CREATININE 0.80 09/27/2018   BUN 19 07/30/2018   CO2 34 (H) 07/30/2018   TSH 1.63 07/30/2018   PSA 0.52 02/11/2016   INR 1.00 04/21/2011    Lab Results  Component Value Date   TSH 1.63 07/30/2018   Lab Results  Component Value Date   WBC 7.9 08/23/2018   HGB 13.3 08/23/2018   HCT 39.8 08/23/2018   MCV 88  08/23/2018   PLT 190 08/23/2018   Lab Results  Component Value Date   NA 141 07/30/2018   K 4.3 07/30/2018   CO2 34 (H) 07/30/2018   GLUCOSE 90 07/30/2018   BUN 19 07/30/2018   CREATININE 0.80 09/27/2018   BILITOT 0.6 07/30/2018   ALKPHOS 64 07/30/2018   AST 16 07/30/2018   ALT 28 07/30/2018   PROT 6.8 07/30/2018   ALBUMIN 4.5 07/30/2018   CALCIUM 9.6 07/30/2018   ANIONGAP 10 12/07/2015   GFR 92.06 07/30/2018   Lab Results  Component Value Date   CHOL 160 07/30/2018   Lab Results  Component Value Date   HDL 59.00 07/30/2018   Lab Results  Component Value Date   LDLCALC 91 07/30/2018   Lab Results  Component Value Date   TRIG 52.0 07/30/2018   Lab Results  Component Value Date   CHOLHDL 3 07/30/2018   No results found for: HGBA1C     Assessment & Plan:   Problem List Items Addressed This Visit    Multiple sclerosis (HCC)    He continues to loose strength and mobility and has come to the conclusion he will be wheelchair bound from here on out.      Hyperlipidemia    Encouraged heart healthy diet, increase exercise, avoid trans fats, consider a krill oil cap daily      Hypothyroidism    Monitor and supplement      HTN (hypertension)    Well controlled, no changes to meds. Encouraged heart healthy diet such as the DASH diet and exercise as tolerated.       Depression with anxiety    He is continuing his meds and following closely with a counselor but he is very tearful due to his loss of mobility. He has been unable to ambulate safely the past few days and he is admitting he will need to remain in his wheelchair from hear on out. He does not want to change meds at this time but will let us know if he changes his mind.       Other Visit Diagnoses    Need for influenza vaccination    -  Primary   Relevant Orders   Flu Vaccine QUAD 6+ mos PF IM (Fluarix Quad PF) (Completed)      I am having [name] maintain his cetirizine, aspirin EC,  ibuprofen, BACLOFEN IT, natalizumab, Clonidine HCl, clotrimazole-betamethasone, nitroGLYCERIN, olmesartan, clobetasol cream, gabapentin, atorvastatin, dalfampridine, tamsulosin, ranitidine, nystatin cream, omeprazole, CYMBALTA, cyclobenzaprine, sucralfate, SYNTHROID, clonazePAM, methylphenidate, and sucralfate.  Meds ordered this encounter  Medications  . sucralfate (CARAFATE) 1 g tablet    Sig: Take 1 tablet (1 g total) by mouth 4 (four) times daily -  with meals and at bedtime. Take tablet or liquid but not both    Dispense:  120 tablet    Refill:  1     [name], MD

## 2018-10-03 NOTE — Assessment & Plan Note (Signed)
Monitor and supplement 

## 2018-10-03 NOTE — Assessment & Plan Note (Signed)
He is continuing his meds and following closely with a counselor but he is very tearful due to his loss of mobility. He has been unable to ambulate safely the past few days and he is admitting he will need to remain in his wheelchair from hear on out. He does not want to change meds at this time but will let us know if he changes his mind.

## 2018-10-03 NOTE — Assessment & Plan Note (Signed)
Encouraged heart healthy diet, increase exercise, avoid trans fats, consider a krill oil cap daily 

## 2018-10-03 NOTE — Assessment & Plan Note (Signed)
Well controlled, no changes to meds. Encouraged heart healthy diet such as the DASH diet and exercise as tolerated.  °

## 2018-10-04 ENCOUNTER — Other Ambulatory Visit: Payer: Self-pay | Admitting: Family Medicine

## 2018-10-04 DIAGNOSIS — F418 Other specified anxiety disorders: Secondary | ICD-10-CM

## 2018-10-14 ENCOUNTER — Telehealth: Payer: Self-pay | Admitting: *Deleted

## 2018-10-14 NOTE — Telephone Encounter (Signed)
Received Physician Orders from [facility]; forwarded to provider/SLS 11/07

## 2018-10-15 ENCOUNTER — Ambulatory Visit (INDEPENDENT_AMBULATORY_CARE_PROVIDER_SITE_OTHER): Payer: 59 | Admitting: Psychology

## 2018-10-15 DIAGNOSIS — F4323 Adjustment disorder with mixed anxiety and depressed mood: Secondary | ICD-10-CM | POA: Diagnosis not present

## 2018-10-22 ENCOUNTER — Other Ambulatory Visit: Payer: Self-pay

## 2018-10-22 ENCOUNTER — Other Ambulatory Visit: Payer: Self-pay | Admitting: *Deleted

## 2018-10-22 MED ORDER — METHYLPHENIDATE HCL 10 MG PO TABS: 10.0000 mg | ORAL_TABLET | Freq: Four times a day (QID) | ORAL | 0 refills | Status: DC

## 2018-10-27 ENCOUNTER — Telehealth: Payer: Self-pay | Admitting: *Deleted

## 2018-10-27 NOTE — Telephone Encounter (Signed)
Received notification from [facility] that pt specialty pharmacy will be changing from [facility] specialty pharmacy to [facility]. They are transferring prescriptions to [facility] for pt. They will need to fill Ampyra via [facility].

## 2018-10-29 ENCOUNTER — Ambulatory Visit (INDEPENDENT_AMBULATORY_CARE_PROVIDER_SITE_OTHER): Payer: 59 | Admitting: Psychology

## 2018-10-29 ENCOUNTER — Ambulatory Visit: Payer: Managed Care, Other (non HMO) | Admitting: Psychology

## 2018-10-29 DIAGNOSIS — F4323 Adjustment disorder with mixed anxiety and depressed mood: Secondary | ICD-10-CM

## 2018-11-02 ENCOUNTER — Other Ambulatory Visit: Payer: Self-pay | Admitting: Family Medicine

## 2018-11-12 ENCOUNTER — Ambulatory Visit (INDEPENDENT_AMBULATORY_CARE_PROVIDER_SITE_OTHER): Payer: 59 | Admitting: Psychology

## 2018-11-12 ENCOUNTER — Other Ambulatory Visit: Payer: Self-pay | Admitting: Family Medicine

## 2018-11-12 DIAGNOSIS — F4323 Adjustment disorder with mixed anxiety and depressed mood: Secondary | ICD-10-CM

## 2018-11-18 ENCOUNTER — Other Ambulatory Visit: Payer: Self-pay | Admitting: Family Medicine

## 2018-11-22 ENCOUNTER — Telehealth: Payer: Self-pay | Admitting: *Deleted

## 2018-11-22 NOTE — Telephone Encounter (Signed)
Received Physician Orders from [facility]; forwarded to provider/SLS 12/16

## 2018-11-25 ENCOUNTER — Other Ambulatory Visit: Payer: Self-pay | Admitting: *Deleted

## 2018-11-25 MED ORDER — METHYLPHENIDATE HCL 10 MG PO TABS: 10.0000 mg | ORAL_TABLET | Freq: Four times a day (QID) | ORAL | 0 refills | Status: DC

## 2018-11-25 NOTE — Progress Notes (Signed)
Checked drug registry. He last refilled 10/22/18 and not receiving from other MD's. Last seen 08/23/18 and next f/u 02/21/19.

## 2018-11-26 ENCOUNTER — Ambulatory Visit (INDEPENDENT_AMBULATORY_CARE_PROVIDER_SITE_OTHER): Payer: 59 | Admitting: Psychology

## 2018-11-26 DIAGNOSIS — F4323 Adjustment disorder with mixed anxiety and depressed mood: Secondary | ICD-10-CM

## 2018-12-13 ENCOUNTER — Telehealth: Payer: Self-pay | Admitting: Neurology

## 2018-12-13 NOTE — Telephone Encounter (Signed)
pt has called for the [facility], call transferred °

## 2018-12-15 ENCOUNTER — Telehealth: Payer: Self-pay | Admitting: Neurology

## 2018-12-15 NOTE — Telephone Encounter (Signed)
pt has called for the [facility], call transferred °

## 2018-12-21 ENCOUNTER — Ambulatory Visit (INDEPENDENT_AMBULATORY_CARE_PROVIDER_SITE_OTHER): Payer: 59 | Admitting: Psychology

## 2018-12-21 DIAGNOSIS — F4323 Adjustment disorder with mixed anxiety and depressed mood: Secondary | ICD-10-CM

## 2018-12-24 ENCOUNTER — Ambulatory Visit: Payer: 59 | Admitting: Psychology

## 2018-12-25 ENCOUNTER — Other Ambulatory Visit: Payer: Self-pay | Admitting: Family Medicine

## 2019-01-03 ENCOUNTER — Telehealth: Payer: Self-pay | Admitting: *Deleted

## 2019-01-03 NOTE — Telephone Encounter (Signed)
Received fax notification from [place] that [employer] approved 12/20/2018-12/21/2019. Authorization#: [id number]. Jcode: [id number]. Given to [facility] for their records.

## 2019-01-03 NOTE — Telephone Encounter (Signed)
Faxed completed/signed [employer] pt status report and reauth questionnaire to [facility] at [phone]. Received confirmation. Received fax notification back that pt authorized 01/03/2019-08/03/2019. Pt enrollment number: [id number]. Account: [facility]. Site auth #: [id number].

## 2019-01-06 ENCOUNTER — Telehealth: Payer: Self-pay | Admitting: *Deleted

## 2019-01-06 NOTE — Telephone Encounter (Signed)
Received Physician Orders from [facility]; forwarded to provider/SLS 01/30

## 2019-01-07 ENCOUNTER — Ambulatory Visit (INDEPENDENT_AMBULATORY_CARE_PROVIDER_SITE_OTHER): Payer: 59 | Admitting: Psychology

## 2019-01-07 DIAGNOSIS — F4323 Adjustment disorder with mixed anxiety and depressed mood: Secondary | ICD-10-CM

## 2019-01-14 ENCOUNTER — Ambulatory Visit (INDEPENDENT_AMBULATORY_CARE_PROVIDER_SITE_OTHER): Payer: Managed Care, Other (non HMO) | Admitting: Family Medicine

## 2019-01-14 ENCOUNTER — Encounter: Payer: Self-pay | Admitting: Family Medicine

## 2019-01-14 VITALS — BP 118/70 | HR 81 | Temp 97.8°F | Resp 16 | Ht 73.0 in | Wt 274.6 lb

## 2019-01-14 DIAGNOSIS — E039 Hypothyroidism, unspecified: Secondary | ICD-10-CM

## 2019-01-14 DIAGNOSIS — F418 Other specified anxiety disorders: Secondary | ICD-10-CM

## 2019-01-14 DIAGNOSIS — G35 Multiple sclerosis: Secondary | ICD-10-CM

## 2019-01-14 DIAGNOSIS — E785 Hyperlipidemia, unspecified: Secondary | ICD-10-CM

## 2019-01-14 DIAGNOSIS — G4710 Hypersomnia, unspecified: Secondary | ICD-10-CM | POA: Diagnosis not present

## 2019-01-14 DIAGNOSIS — R0683 Snoring: Secondary | ICD-10-CM | POA: Diagnosis not present

## 2019-01-14 DIAGNOSIS — I10 Essential (primary) hypertension: Secondary | ICD-10-CM

## 2019-01-14 DIAGNOSIS — L309 Dermatitis, unspecified: Secondary | ICD-10-CM

## 2019-01-14 LAB — COMPREHENSIVE METABOLIC PANEL
ALT: 28 U/L (ref 0–53)
AST: 17 U/L (ref 0–37)
Albumin: 4.3 g/dL (ref 3.5–5.2)
Alkaline Phosphatase: 69 U/L (ref 39–117)
BUN: 17 mg/dL (ref 6–23)
CO2: 31 mEq/L (ref 19–32)
Calcium: 9.1 mg/dL (ref 8.4–10.5)
Chloride: 106 mEq/L (ref 96–112)
Creatinine, Ser: 0.84 mg/dL (ref 0.40–1.50)
GFR: 98.42 mL/min (ref >60.00)
Glucose, Bld: 84 mg/dL (ref 70–99)
Potassium: 4.5 mEq/L (ref 3.5–5.1)
Sodium: 143 mEq/L (ref 135–145)
Total Bilirubin: 0.6 mg/dL (ref 0.2–1.2)
Total Protein: 6.3 g/dL (ref 6.0–8.3)

## 2019-01-14 LAB — CBC
HCT: 42.7 % (ref 39.0–52.0)
Hemoglobin: 14.3 g/dL (ref 13.0–17.0)
MCHC: 33.5 g/dL (ref 30.0–36.0)
MCV: 88.8 fl (ref 78.0–100.0)
Platelets: 175 10*3/uL (ref 150.0–400.0)
RBC: 4.81 Mil/uL (ref 4.22–5.81)
RDW: 14.7 % (ref 11.5–15.5)
WBC: 6.5 10*3/uL (ref 4.0–10.5)

## 2019-01-14 LAB — TSH: TSH: 0.4 u[IU]/mL (ref 0.35–4.50)

## 2019-01-14 LAB — LIPID PANEL
Cholesterol: 141 mg/dL (ref 0–200)
HDL: 54.1 mg/dL (ref >39.00)
LDL Cholesterol: 79 mg/dL (ref 0–99)
NonHDL: 86.77
Total CHOL/HDL Ratio: 3
Triglycerides: 39 mg/dL (ref 0.0–149.0)
VLDL: 7.8 mg/dL (ref 0.0–40.0)

## 2019-01-14 NOTE — Assessment & Plan Note (Signed)
Well controlled, no changes to meds. Encouraged heart healthy diet such as the DASH diet and exercise as tolerated.  °

## 2019-01-14 NOTE — Assessment & Plan Note (Signed)
Encouraged heart healthy diet, increase exercise, avoid trans fats, consider a krill oil cap daily 

## 2019-01-14 NOTE — Assessment & Plan Note (Signed)
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 Has a regimen that is working well.

## 2019-01-14 NOTE — Patient Instructions (Signed)

## 2019-01-16 DIAGNOSIS — G4710 Hypersomnia, unspecified: Secondary | ICD-10-CM | POA: Insufficient documentation

## 2019-01-16 NOTE — Assessment & Plan Note (Signed)
He continues on current meds but notes he felt better for the short time while he was on steroids. He has been referred to psychiatry but has not gotten an appointment yet so will check on referral status and proceed

## 2019-01-16 NOTE — Progress Notes (Signed)
Subjective:    Patient ID: Keith Morrow, male    DOB: 06-16-1971, 48 y.o.   MRN: [id number]  Chief Complaint  Patient presents with  . Hyperlipidemia  . Hypertension    HPI Patient is in today for follow-up.  He continues to have worsening weakness and is very depressed by the fact that he is in a wheelchair most of the time.  He is still awaiting an appointment with psychiatry but continues with regular counseling and his medications.  He endorses anhedonia but denies suicidal ideation.  No recent febrile illness or acute hospitalizations. Denies CP/palp/SOB/HA/congestion/fevers/GI or GU c/o. Taking meds as prescribed. He notes worsening fatigue and he falls asleep during most activities. He also endorses snoring.   Past Medical History:  Diagnosis Date  . Acute bronchitis 05/09/2013  . Allergy   . Bronchitis, acute 12/10/2011  . Candidal skin infection 05/05/2017  . Chicken pox as a child  . Constipation 02/20/2017  . Coronary atherosclerosis of native coronary artery    a. NSTEMI 04/20/11: tx with Promus DES to LAD; bifurcation lesion with oDx 90% tx with POBA; b. staged PCI of right post. AV Branch with Promus DES; c. residual at cath 04/21/11:  AV groove CFX 50%, mRCA 30%,; EF 55%  . Depression   . Depression with anxiety   . Dermatitis, contact 12/10/2011  . Fatigue 10/15/2012  . Heart attack (HCC) 04-20-11  . HTN (hypertension)   . Hyperlipidemia   . Hypotestosteronism 11/17/2011  . Hypothyroidism   . Keloid scar   . MS (multiple sclerosis) (HCC)   . Neuromuscular disorder (HCC)    NUMBNESS/TINGLING  . Obesity   . Onychomycosis   . Preventative health care 10/07/2011  . Reflux 10/07/2011  . Sun-damaged skin 02/17/2016  . Testosterone deficiency 01/16/2012  . Tinea pedis   . 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 Visual changes 11/13/2016    Past Surgical History:  Procedure Laterality Date  . CORONARY ANGIOPLASTY WITH STENT PLACEMENT     2012  . PAIN PUMP  IMPLANTATION N/A 08/29/2014   Procedure: Baclofen pump placement;  Surgeon: Maeola Harman, MD;  Location: [facility];  Service: Neurosurgery;  Laterality: N/A;  Baclofen pump placement  . PROGRAMABLE BACLOFEN PUMP REVISION  08/29/14  . right wrist surgery     CTR  . VASCULAR SURGERY     vericose vein in right femoral area  . VASECTOMY    . WISDOM TOOTH EXTRACTION      Family History  Problem Relation Age of Onset  . Heart attack Mother 73  . Thyroid disease Mother   . Heart disease Mother   . Thyroid disease Sister   . Thyroid disease Brother   . Heart disease Maternal Grandmother   . Hypertension Maternal Grandmother   . Hyperlipidemia Maternal Grandmother   . Heart disease Maternal Grandfather   . Hypertension Maternal Grandfather   . Hyperlipidemia Maternal Grandfather   . Stroke Paternal Grandfather   . Thyroid disease Sister   . Heart disease Father        2 stents  . Coronary artery disease Unknown        questionable in father    Social History   Socioeconomic History  . Marital status: Married    Spouse name: Not on file  . Number of children: 3  . Years of education: Not on file  . Highest education level: Not on file  Occupational History  . Not on  file  Social Needs  . Financial resource strain: Not on file  . Food insecurity:    Worry: Not on file    Inability: Not on file  . Transportation needs:    Medical: Not on file    Non-medical: Not on file  Tobacco Use  . Smoking status: Never Smoker  . Smokeless tobacco: Never Used  Substance and Sexual Activity  . Alcohol use: Yes    Comment: socially  . Drug use: No  . Sexual activity: Yes    Partners: Female    Comment: 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, works [employer], no dietary restrictions  Lifestyle  . Physical activity:    Days per week: Not on file    Minutes per session: Not on file  . Stress: Not on file  Relationships  . Social connections:    Talks on phone: Not on file    Gets  together: Not on file    Attends religious service: Not on file    Active member of club or organization: Not on file    Attends meetings of clubs or organizations: Not on file    Relationship status: Not on file  . Intimate partner violence:    Fear of current or ex partner: Not on file    Emotionally abused: Not on file    Physically abused: Not on file    Forced sexual activity: Not on file  Other Topics Concern  . Not on file  Social History Narrative  . Not on file    Outpatient Medications Prior to Visit  Medication Sig Dispense Refill  . aspirin EC 81 MG tablet Take 81 mg by mouth daily.    Marland Kitchen. atorvastatin (LIPITOR) 40 MG tablet Take 1 tablet (40 mg total) by mouth daily at 6 PM. 90 tablet 3  . BACLOFEN IT 1 Dose by Intrathecal route as directed.     . cetirizine (ZYRTEC) 10 MG tablet Take 10 mg by mouth daily as needed for allergies.     . clobetasol cream (TEMOVATE) 0.05 % Apply 1 application topically 2 (two) times daily as needed (skin).    . clonazePAM (KLONOPIN) 1 MG tablet TAKE 1 TABLET BY MOUTH 4 TIMES A DAY 360 tablet 1  . Clonidine HCl POWD by Does not apply route. 720mg /ml IT Baclofen. Total dose 157.89mg /day. Last pump refill 06/02/16    . clotrimazole-betamethasone (LOTRISONE) cream Apply 1 application topically 2 (two) times daily as needed (anti-fungal). 30 g 2  . cyclobenzaprine (FLEXERIL) 10 MG tablet Take 1 tablet (10 mg total) by mouth 3 (three) times daily as needed for muscle spasms. 90 tablet 11  . CYMBALTA 60 MG capsule TAKE 1 CAPSULE (60 MG TOTAL) BY MOUTH 2 (TWO) TIMES DAILY. 180 capsule 1  . dalfampridine (AMPYRA) 10 MG TB12 Take 1 tablet (10 mg total) by mouth every 12 (twelve) hours. 180 tablet 3  . gabapentin (NEURONTIN) 300 MG capsule Take 300-600 mg by mouth 3 (three) times daily. 300 mg by mouth morning and afternoon 600 mg by mouth at bedtime    . ibuprofen (ADVIL,MOTRIN) 200 MG tablet Take 200 mg by mouth every 6 (six) hours as needed for moderate  pain.    . methylphenidate (RITALIN) 10 MG tablet Take 1 tablet (10 mg total) by mouth 4 (four) times daily. 120 tablet 0  . natalizumab (TYSABRI) 300 MG/15ML injection Inject 300 mg into the vein every 30 (thirty) days.    . nitroGLYCERIN (NITROSTAT) 0.4 MG SL  tablet PLACE 1 TABLET UNDER THE TOUNGE EVERY 5 MINUTES AS NEEDED FOR CHEST PAIN (X 3 DOSES) 25 tablet 0  . nystatin cream (MYCOSTATIN) Apply 1 application topically as directed. 30 g 2  . olmesartan (BENICAR) 20 MG tablet TAKE 1 TABLET BY MOUTH EVERY DAY 90 tablet 1  . omeprazole (PRILOSEC) 40 MG capsule TAKE 1 CAPSULE BY MOUTH EVERY DAY 90 capsule 1  . ranitidine (ZANTAC) 300 MG tablet TAKE 1 TABLET BY MOUTH EVERYDAY AT BEDTIME 90 tablet 1  . sucralfate (CARAFATE) 1 g tablet Take 1 tablet (1 g total) by mouth 4 (four) times daily -  with meals and at bedtime. Take tablet or liquid but not both 120 tablet 1  . sucralfate (CARAFATE) 1 GM/10ML suspension Take 10 mLs (1 g total) by mouth 4 (four) times daily -  with meals and at bedtime. Take liquid or tablets but not both 420 mL 1  . SYNTHROID 150 MCG tablet TAKE 1 TABLET (150 MCG TOTAL) BY MOUTH DAILY BEFORE BREAKFAST. 90 tablet 0  . tamsulosin (FLOMAX) 0.4 MG CAPS capsule TAKE ONE CAPSULE BY MOUTH EVERY DAY 90 capsule 3   No facility-administered medications prior to visit.     No Known Allergies  Review of Systems  Constitutional: Positive for malaise/fatigue. Negative for fever.  HENT: Negative for congestion.   Eyes: Negative for blurred vision.  Respiratory: Negative for shortness of breath.   Cardiovascular: Negative for chest pain, palpitations and leg swelling.  Gastrointestinal: Negative for abdominal pain, blood in stool and nausea.  Genitourinary: Negative for dysuria and frequency.  Musculoskeletal: Negative for falls.  Skin: Negative for rash.  Neurological: Positive for weakness. Negative for dizziness, loss of consciousness and headaches.  Endo/Heme/Allergies:  Negative for environmental allergies.  Psychiatric/Behavioral: Positive for depression. The patient is not nervous/anxious.        Objective:    Physical Exam Vitals signs and nursing note reviewed.  Constitutional:      General: He is not in acute distress.    Appearance: He is well-developed.  HENT:     Head: Normocephalic and atraumatic.     Nose: Nose normal.  Eyes:     General:        Right eye: No discharge.        Left eye: No discharge.  Neck:     Musculoskeletal: Normal range of motion and neck supple.  Cardiovascular:     Rate and Rhythm: Normal rate and regular rhythm.     Heart sounds: No murmur.  Pulmonary:     Effort: Pulmonary effort is normal.     Breath sounds: Normal breath sounds.  Abdominal:     General: Bowel sounds are normal.     Palpations: Abdomen is soft.     Tenderness: There is no abdominal tenderness.  Skin:    General: Skin is warm and dry.  Neurological:     Mental Status: He is alert and oriented to person, place, and time.     Motor: Weakness present.     Coordination: Coordination abnormal.     Gait: Gait abnormal.     BP 118/70 (BP Location: Left Arm, Cuff Size: Large)   Pulse 81   Temp 97.8 F (36.6 C) (Oral)   Resp 16   Ht 6\' 1"  (1.854 m)   Wt 274 lb 9.6 oz (124.6 kg)   SpO2 97%   BMI 36.23 kg/m  Wt Readings from Last 3 Encounters:  01/14/19 274 lb 9.6 oz (  124.6 kg)  10/01/18 283 lb (128.4 kg)  09/07/18 280 lb 8 oz (127.2 kg)     Lab Results  Component Value Date   WBC 6.5 01/14/2019   HGB 14.3 01/14/2019   HCT 42.7 01/14/2019   PLT 175.0 01/14/2019   GLUCOSE 84 01/14/2019   CHOL 141 01/14/2019   TRIG 39.0 01/14/2019   HDL 54.10 01/14/2019   LDLCALC 79 01/14/2019   ALT 28 01/14/2019   AST 17 01/14/2019   NA 143 01/14/2019   K 4.5 01/14/2019   CL 106 01/14/2019   CREATININE 0.84 01/14/2019   BUN 17 01/14/2019   CO2 31 01/14/2019   TSH 0.40 01/14/2019   PSA 0.52 02/11/2016   INR 1.00 04/21/2011    Lab  Results  Component Value Date   TSH 0.40 01/14/2019   Lab Results  Component Value Date   WBC 6.5 01/14/2019   HGB 14.3 01/14/2019   HCT 42.7 01/14/2019   MCV 88.8 01/14/2019   PLT 175.0 01/14/2019   Lab Results  Component Value Date   NA 143 01/14/2019   K 4.5 01/14/2019   CO2 31 01/14/2019   GLUCOSE 84 01/14/2019   BUN 17 01/14/2019   CREATININE 0.84 01/14/2019   BILITOT 0.6 01/14/2019   ALKPHOS 69 01/14/2019   AST 17 01/14/2019   ALT 28 01/14/2019   PROT 6.3 01/14/2019   ALBUMIN 4.3 01/14/2019   CALCIUM 9.1 01/14/2019   ANIONGAP 10 12/07/2015   GFR 98.42 01/14/2019   Lab Results  Component Value Date   CHOL 141 01/14/2019   Lab Results  Component Value Date   HDL 54.10 01/14/2019   Lab Results  Component Value Date   LDLCALC 79 01/14/2019   Lab Results  Component Value Date   TRIG 39.0 01/14/2019   Lab Results  Component Value Date   CHOLHDL 3 01/14/2019   No results found for: HGBA1C     Assessment & Plan:   Problem List Items Addressed This Visit    Multiple sclerosis (HCC)    Continues to progress and is now in wheelchair most of the time      Hyperlipidemia    Encouraged heart healthy diet, increase exercise, avoid trans fats, consider a krill oil cap daily      Relevant Orders   Lipid panel (Completed)   Hypothyroidism    On Levothyroxine, continue to monitor      HTN (hypertension)    Well controlled, no changes to meds. Encouraged heart healthy diet such as the DASH diet and exercise as tolerated.       Relevant Orders   CBC (Completed)   Comprehensive metabolic panel (Completed)   TSH (Completed)   Depression with anxiety    He continues on current meds but notes he felt better for the short time while he was on steroids. He has been referred to psychiatry but has not gotten an appointment yet so will check on referral status and proceed      Dermatitis    Is struggling with recurrent dermatitis on legs and buttocks. Has a  regimen that is working well.      Hypersomnolence    Falling asleep easier and easier at any time of day. Notes snoring as well will refer to pulmonology for further consideration of sleep study.      Relevant Orders   Ambulatory referral to Pulmonology    Other Visit Diagnoses    Snoring    -  Primary  Relevant Orders   Ambulatory referral to Pulmonology      I am having [name] maintain his cetirizine, aspirin EC, ibuprofen, BACLOFEN IT, natalizumab, Clonidine HCl, clotrimazole-betamethasone, nitroGLYCERIN, clobetasol cream, gabapentin, atorvastatin, dalfampridine, tamsulosin, nystatin cream, cyclobenzaprine, sucralfate, clonazePAM, sucralfate, SYNTHROID, omeprazole, ranitidine, CYMBALTA, methylphenidate, and olmesartan.  No orders of the defined types were placed in this encounter.    [name], MD

## 2019-01-16 NOTE — Assessment & Plan Note (Signed)
Falling asleep easier and easier at any time of day. Notes snoring as well will refer to pulmonology for further consideration of sleep study.

## 2019-01-16 NOTE — Assessment & Plan Note (Signed)
On Levothyroxine, continue to monitor 

## 2019-01-16 NOTE — Assessment & Plan Note (Signed)
Continues to progress and is now in wheelchair most of the time

## 2019-01-19 ENCOUNTER — Other Ambulatory Visit: Payer: Self-pay

## 2019-01-20 ENCOUNTER — Other Ambulatory Visit: Payer: Self-pay | Admitting: Neurology

## 2019-01-20 MED ORDER — METHYLPHENIDATE HCL 10 MG PO TABS: 10.0000 mg | ORAL_TABLET | Freq: Four times a day (QID) | ORAL | 0 refills | Status: DC

## 2019-01-20 NOTE — Telephone Encounter (Signed)
[place] Database Verified LR: 11-25-18 Qty: 120 Pending appointment: 02-21-2019.

## 2019-01-21 ENCOUNTER — Ambulatory Visit (INDEPENDENT_AMBULATORY_CARE_PROVIDER_SITE_OTHER): Payer: 59 | Admitting: Psychology

## 2019-01-21 DIAGNOSIS — F4323 Adjustment disorder with mixed anxiety and depressed mood: Secondary | ICD-10-CM | POA: Diagnosis not present

## 2019-01-24 ENCOUNTER — Encounter: Payer: Self-pay | Admitting: Physician Assistant

## 2019-02-02 ENCOUNTER — Other Ambulatory Visit: Payer: Self-pay | Admitting: Neurology

## 2019-02-02 ENCOUNTER — Other Ambulatory Visit: Payer: Self-pay | Admitting: *Deleted

## 2019-02-02 MED ORDER — DALFAMPRIDINE ER 10 MG PO TB12: 10.0000 mg | ORAL_TABLET | Freq: Two times a day (BID) | ORAL | 3 refills | Status: DC

## 2019-02-03 ENCOUNTER — Telehealth: Payer: Self-pay | Admitting: *Deleted

## 2019-02-03 NOTE — Telephone Encounter (Signed)
Received Physician Orders from [facility]; forwarded to provider/SLS

## 2019-02-04 ENCOUNTER — Encounter: Payer: Self-pay | Admitting: Family Medicine

## 2019-02-04 ENCOUNTER — Ambulatory Visit (INDEPENDENT_AMBULATORY_CARE_PROVIDER_SITE_OTHER): Payer: 59 | Admitting: Psychology

## 2019-02-04 DIAGNOSIS — F4323 Adjustment disorder with mixed anxiety and depressed mood: Secondary | ICD-10-CM

## 2019-02-08 NOTE — Telephone Encounter (Signed)
Keith Morrow Can you follow up on his referral that was placed on 10/04/18 by PCP

## 2019-02-08 NOTE — Telephone Encounter (Signed)
Please advise 

## 2019-02-14 ENCOUNTER — Telehealth: Payer: Self-pay | Admitting: *Deleted

## 2019-02-14 ENCOUNTER — Encounter: Payer: Self-pay | Admitting: Neurology

## 2019-02-14 ENCOUNTER — Ambulatory Visit (INDEPENDENT_AMBULATORY_CARE_PROVIDER_SITE_OTHER): Payer: Managed Care, Other (non HMO) | Admitting: Neurology

## 2019-02-14 DIAGNOSIS — Z79899 Other long term (current) drug therapy: Secondary | ICD-10-CM | POA: Diagnosis not present

## 2019-02-14 DIAGNOSIS — R5383 Other fatigue: Secondary | ICD-10-CM

## 2019-02-14 DIAGNOSIS — G35 Multiple sclerosis: Secondary | ICD-10-CM

## 2019-02-14 DIAGNOSIS — R252 Cramp and spasm: Secondary | ICD-10-CM | POA: Diagnosis not present

## 2019-02-14 DIAGNOSIS — R3915 Urgency of urination: Secondary | ICD-10-CM

## 2019-02-14 MED ORDER — METHYLPHENIDATE HCL 10 MG PO TABS: 10.0000 mg | ORAL_TABLET | Freq: Four times a day (QID) | ORAL | 0 refills | Status: DC

## 2019-02-14 MED ORDER — BACLOFEN 10 MG PO TABS: 10.0000 mg | ORAL_TABLET | Freq: Three times a day (TID) | ORAL | 5 refills | Status: DC | PRN

## 2019-02-14 NOTE — Progress Notes (Signed)
HPI    Baclofen pump refill     Additional comments: RM 12, alone. Ambulating with walker.        Last edited by Hillis Range, RN on 02/14/2019  3:13 PM. (History)       Chief Complaint    Baclofen pump refill      HISTORY:  Keith Morrow is a 48 y.o. man with a relapsing form of multiple sclerosis who has right greater than left leg weakness and progressive difficulties with gait function.  Due to severe spasticity, a baclofen pump was placed in 2013.  Update 02/14/2019. The last visit, he reports no new exacerbations.  He does note that his gait is slightly worse and his range is reduced.  He is needing to sit down more with a walker than he did in the past.  He does have an electric wheelchair he uses in the house when more tired and outside of the house at times.  He feels the spasticity does well for the most part but he does note more stiffness when the temperatures are cold.  He sometimes has difficulty relaxing 1 of both legs when he transfers in and out of a car.  In the past, when he was on higher dose of intrathecal baclofen, he became too weak.  We discussed using PR and baclofen pills for times when he notes more stiffness.  Update 08/23/2018: He denies any exacerbations but has noted a little more weakness in his legs.  He is on Tysabri 300 mg every 4 weeks and he tolerates it well.   The last JCV antibody titer was indeterminate at 0.2 but the inhibition assay was negative.  His main problem continues to be leg weakness and poor gait.  With a walker, he can use the walker about 100 feet without a stop.  Ampyra has helped and he noted getting worse in the day that he did not take his dose.  Due to the weakness in his legs he uses the arms quite a bit to help with his gait.  He sees physical therapy on a weekly basis.  Due to spasticity he is on a baclofen pump.  He is on a combination of baclofen and clonidine and the combination has helped more than baclofen alone.  He will  occasionally get some phasic spasms, sometimes while walking and sometimes on getting into the car.  They do not occur at night.  He continues on a basal rate with boluses at 11 PM and 2 AM.   His dysesthesias in the chest are painful at times and gabapentin has helped.  He also gets some leg numbness at times.  He has some urinary frequency and rare urge incontinence.   Has some fatigue helped by Ritalin.  Update 02/15/2018:    He feels that his MS is stable and he has no further exacerbations.  He is on Tysabri.   He tolerates it well. We need to recheck the JCV Ab. It has been negative so far. He feels his gait is doing about the same. With the walker he is able to get between his core in the office Around the house he usually does not have much trouble.   The baclofen pump has helped. He continues on a combination of  Baclofen and clonidine. He does note that he has more spasticity in the late evening and when he lays down at night.    Currently, he has a basal rate and two  boluses at 11 PM and at 2 AM.   His other MS symptoms are doing about the same   08/24/2017: MS:   He is currently on Tysabri. He tolerates it well and there has been no definite exacerbations.    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                       Gait:   He is able to use a Rollator walker around the house and at work much of the time. However, he requires the electric wheelchair at other times. He is doing physical therapy once a week.  He is unable to walk without the Rollator due to the weakness in his legs, right greater than left.   Spasticity:   Before the baclofen pump, he would get both tonic and phasic spasms in the legs that would often be uncomfortable.   His spasticity is improved with the combination of baclofen and clonidine in his pump.  He has not had any recent episodes of his legs locking up. His legs are easily bent. He occasionally will get a mild cramping sensation in the right thigh. This occurs if he  sits a long time. It is not associated with actual spasticity worsening.   PHYSICAL EXAM  General: The patient is well-developed and well-nourished and in no acute distress.  Extremities:   There are no rashes or edema.  He has mild tenderness over the left subacromial bursa and some pain upon external rotation and elevation  Neurologic Exam  Mental status: The patient is alert and oriented x 3 at the time of the examination. Intact attention and recall.   Speech is normal.  CN:   Extraocular muscles are normal.  No ptosis.    Symmetric facial strength and sensation.    PE/TP midline  Motor: Muscle bulk is normal.tone is mildly increased in the right arm and minimally increased in the legs.  Strength is 4+/5 in the right arm, 5/5 in the left arm, 2+/5 in the right leg and 3 to 4-/5 in the left leg   Gait and station: Requires support to stand.  He is using a Rollator type walker.  He has foot drop bilaterally, worse on the right he cannot tandem walk  Reflexes: Deep tendon reflexes are trace at the left knee and 2+ at the right knee.No ankleclonus   ASSESSMENT  Multiple sclerosis (HCC) - Plan: CBC with Differential/Platelet, Stratify JCV Antibody Test ([facility])  High risk medication use - Plan: CBC with Differential/Platelet, Stratify JCV Antibody Test ([facility])  Fatigue, unspecified type  Spasticity  Urinary urgency    PLAN: 1.  The intrathecal pump will be refilled with 3600 mcg/mL baclofen and 720 mcg/mL clonidine.   We will continue the current basal rate and intermittent boluses.  I will call in a prescription for baclofen to take as needed 2.   Continue Tysabri every 4 weeks.  We need to check the JCV antibody today.   3.   Continue other med's    renew methylphenidate 4.   Advised to stay active and try to lose weight if possible. 5.   Return in 6 months or sooner if there are new or worsening neurologic issues.  We can make further adjustments if needed before the  next refill  Keith Morrow A. Epimenio Foot, MD, PhD, FAAN Certified in Neurology, Clinical Neurophysiology, Sleep Medicine, Pain Medicine and Neuroimaging Director, Multiple Sclerosis [facility] at [facility]  [facility]  [address], [address], [place] [address] [phone]

## 2019-02-14 NOTE — Progress Notes (Signed)
Skin prepped with Betadine and draped in sterile fashion.  Center port of implanted pump accessed using Huber needle. Residual fluid measuring 50ml removed from reservoir.  Medication from [facility] instilled into pump.  Pump reprogrammed to reflect refill volume of 36ml. New alarm date is 10/07/2019.  Appt. made for next refill 08/29/19 at 1:30pm.  ERI 27 months.

## 2019-02-14 NOTE — Telephone Encounter (Signed)
Placed JCV lab in [facility] lock box for routine lab pick up. Results pending. 

## 2019-02-15 LAB — CBC WITH DIFFERENTIAL/PLATELET
Basophils Absolute: 0.1 10*3/uL (ref 0.0–0.2)
Basos: 1 %
EOS (ABSOLUTE): 0.3 10*3/uL (ref 0.0–0.4)
Eos: 5 %
Hematocrit: 40.9 % (ref 37.5–51.0)
Hemoglobin: 14.3 g/dL (ref 13.0–17.7)
Immature Grans (Abs): 0 10*3/uL (ref 0.0–0.1)
Immature Granulocytes: 0 %
Lymphocytes Absolute: 2.2 10*3/uL (ref 0.7–3.1)
Lymphs: 29 %
MCH: 29.9 pg (ref 26.6–33.0)
MCHC: 35 g/dL (ref 31.5–35.7)
MCV: 85 fL (ref 79–97)
Monocytes Absolute: 0.5 10*3/uL (ref 0.1–0.9)
Monocytes: 7 %
Neutrophils Absolute: 4.4 10*3/uL (ref 1.4–7.0)
Neutrophils: 58 %
Platelets: 209 10*3/uL (ref 150–450)
RBC: 4.79 x10E6/uL (ref 4.14–5.80)
RDW: 13.5 % (ref 11.6–15.4)
WBC: 7.6 10*3/uL (ref 3.4–10.8)

## 2019-02-18 ENCOUNTER — Ambulatory Visit (INDEPENDENT_AMBULATORY_CARE_PROVIDER_SITE_OTHER): Payer: 59 | Admitting: Psychology

## 2019-02-18 ENCOUNTER — Other Ambulatory Visit: Payer: Self-pay

## 2019-02-18 DIAGNOSIS — F4323 Adjustment disorder with mixed anxiety and depressed mood: Secondary | ICD-10-CM | POA: Diagnosis not present

## 2019-02-21 ENCOUNTER — Ambulatory Visit: Payer: Self-pay | Admitting: Neurology

## 2019-02-21 ENCOUNTER — Other Ambulatory Visit: Payer: Self-pay

## 2019-02-21 ENCOUNTER — Encounter: Payer: Self-pay | Admitting: Physician Assistant

## 2019-02-21 ENCOUNTER — Ambulatory Visit (INDEPENDENT_AMBULATORY_CARE_PROVIDER_SITE_OTHER): Payer: Managed Care, Other (non HMO) | Admitting: Physician Assistant

## 2019-02-21 VITALS — BP 102/70 | HR 93 | Ht 73.0 in | Wt 283.4 lb

## 2019-02-21 DIAGNOSIS — E785 Hyperlipidemia, unspecified: Secondary | ICD-10-CM | POA: Diagnosis not present

## 2019-02-21 DIAGNOSIS — G35 Multiple sclerosis: Secondary | ICD-10-CM

## 2019-02-21 DIAGNOSIS — I2510 Atherosclerotic heart disease of native coronary artery without angina pectoris: Secondary | ICD-10-CM

## 2019-02-21 MED ORDER — ATORVASTATIN CALCIUM 40 MG PO TABS: 40.0000 mg | ORAL_TABLET | Freq: Every day | ORAL | 3 refills | Status: DC

## 2019-02-21 NOTE — Patient Instructions (Signed)
Medication Instructions:  Your physician recommends that you continue on your current medications as directed. Please refer to the Current Medication list given to you today.  If you need a refill on your cardiac medications before your next appointment, please call your pharmacy.   Lab work: NONE ORDERED  TODAY   If you have labs (blood work) drawn today and your tests are completely normal, you will receive your results only by: . MyChart Message (if you have MyChart) OR . A paper copy in the mail If you have any lab test that is abnormal or we need to change your treatment, we will call you to review the results.  Testing/Procedures: NONE ORDERED  TODAY    Follow-Up: At [facility], you and your health needs are our priority.  As part of our continuing mission to provide you with exceptional heart care, we have created designated Provider Care Teams.  These Care Teams include your primary Cardiologist (physician) and Advanced Practice Providers (APPs -  Physician Assistants and Nurse Practitioners) who all work together to provide you with the care you need, when you need it. You will need a follow up appointment in:  1 years.  Please call our office 2 months in advance to schedule this appointment.  You may see Michael Cooper, MD or one of the following Advanced Practice Providers on your designated Care Team: Scott Weaver, PA-C Vin Bhagat, PA-C . Janine Hammond, NP  Any Other Special Instructions Will Be Listed Below (If Applicable).    

## 2019-02-21 NOTE — Progress Notes (Signed)
Cardiology Office Note:    Date:  02/21/2019   ID:  [name], DOB 05/27/1971, MRN [id number]  PCP:  [name], MD  [name], MD   Electrophysiologist:  None   Referring MD: [name], MD   Chief Complaint  Patient presents with   Follow-up    CAD     History of Present Illness:    Keith Morrow is a 48 y.o. male with multiple sclerosis, hypertension, hyperlipidemia, hypothyroidism, coronary artery disease status post non-ST elevation myocardial infarction in 2012 treated with a DES to the mid LAD, balloon angioplasty to the diagonal and staged intervention with a DES to the posterior AV segment of the RCA.  EF was preserved.  Last Myoview in 2017 demonstrated inferior scar with very mild inferior ischemia.  Med Rx was continued.  He was last seen by Dr. Excell Seltzer in 02/19/18.    Mr. Keith Morrow is here for follow up.  He is here alone.  He is not having chest pain or shortness of breath out of proportion to what he deals with from a multiple sclerosis standpoint.  He denies syncope, swelling, medication side effects.    Prior CV studies:   The following studies were reviewed today:  Myoview 12/20/2015 There is a scar in the basal and mid inferior walls with very mild ischemia in the mid and apical inferior walls (SDS 2).;  EF 59; intermediate risk >> medical therapy continued  Myoview 04/09/2012 No ischemia, EF 54, normal study  PCI 04/23/11 2.5 x 12-mm Promus element drug-eluting stent to R Post AV segment branch  Cardiac catheterization 04/22/2011 Left mainstem - widely patent.   LAD - mid  critical 90-95% involving the first diagonal 1st diagonal - 90% ostial Left circumflex.  -  The continuation of the AV groove circumflex has a 50% stenosis at the trifurcation point of the circumflex.  RCA - proximal ectasia and 30% mid stenosis.   PDA is patent without significant stenosis.   Posterolateral branch - 80-90% focal stenosis. Left  ventriculography shows normal LV function.  EF 55%. PCI:  4.0 x 16 mm Promus drug-eluting stent to the LAD and POBA to Dx  Past Medical History:  Diagnosis Date   Acute bronchitis 05/09/2013   Allergy    Bronchitis, acute 12/10/2011   Candidal skin infection 05/05/2017   Chicken pox as a child   Constipation 02/20/2017   Coronary atherosclerosis of native coronary artery    a. NSTEMI 04/20/11: tx with Promus DES to LAD; bifurcation lesion with oDx 90% tx with POBA; b. staged PCI of right post. AV Branch with Promus DES; c. 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   Current Medications: Current Meds  Medication Sig   aspirin EC 81 MG tablet Take 81 mg by mouth daily.   atorvastatin (LIPITOR) 40 MG tablet Take 1 tablet (40 mg total) by mouth daily at 6 PM.   baclofen (LIORESAL) 10 MG tablet Take 1 tablet (10 mg total) by mouth 3 (three) times daily as needed for muscle spasms.   BACLOFEN IT 1 Dose by Intrathecal route as directed.    cetirizine (ZYRTEC) 10 MG tablet Take 10 mg by mouth daily as needed for allergies.     clobetasol cream (TEMOVATE) 0.05 % Apply 1 application topically 2 (two) times daily as needed (skin).   clonazePAM (KLONOPIN) 1 MG tablet TAKE 1 TABLET BY MOUTH 4 TIMES A DAY   Clonidine HCl POWD by Does not apply route. 720mg /ml IT Baclofen. Total dose 157.89mg /day. Last pump refill 06/02/16   clotrimazole-betamethasone (LOTRISONE) cream Apply 1 application topically 2 (two) times daily as needed (anti-fungal).   cyclobenzaprine (FLEXERIL) 10 MG tablet Take 1 tablet (10 mg total) by mouth 3 (three) times daily as needed for muscle spasms.   CYMBALTA 60 MG capsule TAKE 1 CAPSULE (60 MG TOTAL) BY MOUTH 2 (TWO) TIMES DAILY.   dalfampridine (AMPYRA) 10 MG TB12 Take 1 tablet (10 mg total) by mouth every 12 (twelve) hours.   gabapentin (NEURONTIN) 300 MG capsule TAKE ONE CAPSULE BY MOUTH IN THE MORNING , TAKE ONE CAPSULE BY MOUTH IN THE EVENING AND TAKE 2 CAPSULES AT BEDTIME   ibuprofen (ADVIL,MOTRIN) 200 MG tablet Take 200 mg by mouth every 6 (six) hours as needed for moderate pain.   methylphenidate (RITALIN) 10 MG tablet Take 1 tablet (10 mg total) by mouth 4 (four) times daily.   natalizumab (TYSABRI) 300 MG/15ML injection Inject 300 mg into the vein every 28 (twenty-eight) days.    nitroGLYCERIN (NITROSTAT) 0.4 MG SL tablet PLACE 1 TABLET UNDER THE TOUNGE EVERY 5 MINUTES AS NEEDED FOR CHEST PAIN (X 3 DOSES)   nystatin cream (MYCOSTATIN) Apply 1 application topically as directed.   olmesartan (BENICAR) 20 MG tablet TAKE 1 TABLET BY MOUTH EVERY DAY   omeprazole (PRILOSEC) 40 MG capsule TAKE 1 CAPSULE BY MOUTH EVERY DAY   ranitidine (ZANTAC) 300 MG tablet TAKE 1 TABLET BY MOUTH EVERYDAY AT BEDTIME   sucralfate (CARAFATE) 1 g tablet Take 1 tablet (1 g total) by mouth 4 (four) times daily -  with meals and at bedtime. Take tablet or liquid but not both   sucralfate (CARAFATE) 1 GM/10ML suspension Take 10 mLs (1 g total) by mouth 4 (four) times daily -  with meals and at bedtime. Take  liquid or tablets but not both   SYNTHROID 150 MCG tablet TAKE 1 TABLET (150 MCG TOTAL) BY MOUTH DAILY BEFORE BREAKFAST.   tamsulosin (FLOMAX) 0.4 MG CAPS capsule TAKE ONE CAPSULE BY MOUTH EVERY DAY     Allergies:   Patient has no known allergies.   Social History   Tobacco Use   Smoking status: Never Smoker   Smokeless tobacco: Never Used  Substance Use Topics   Alcohol use: Yes    Comment: socially   Drug use: No     Family Hx: The patient's family history includes Coronary artery disease in an other family member; Heart attack (age of onset: 47) in his mother; Heart disease in his father, maternal grandfather, maternal grandmother, and mother;  Hyperlipidemia in his maternal grandfather and maternal grandmother; Hypertension in his maternal grandfather and maternal grandmother; Stroke in his paternal grandfather; Thyroid disease in his brother, mother, sister, and sister.  ROS:   Please see the history of present illness.    ROS All other systems reviewed and are negative.   EKGs/Labs/Other Test Reviewed:    EKG:  EKG is  ordered today.  The ekg ordered today demonstrates normal sinus rhythm, HR 93, normal axis, non-specific ST-TW changes, QTc 445   Recent Labs: 01/14/2019: ALT 28; BUN 17; Creatinine, Ser 0.84; Potassium 4.5; Sodium 143; TSH 0.40 02/14/2019: Hemoglobin 14.3; Platelets 209   Recent Lipid Panel Lab Results  Component Value Date/Time   CHOL 141 01/14/2019 12:03 PM   TRIG 39.0 01/14/2019 12:03 PM   HDL 54.10 01/14/2019 12:03 PM   CHOLHDL 3 01/14/2019 12:03 PM   LDLCALC 79 01/14/2019 12:03 PM    Physical Exam:    VS:  BP 102/70    Pulse 93    Ht 6\' 1"  (1.854 m)    Wt 283 lb 6.4 oz (128.5 kg)    SpO2 98%    BMI 37.39 kg/m     Wt Readings from Last 3 Encounters:  02/21/19 283 lb 6.4 oz (128.5 kg)  01/14/19 274 lb 9.6 oz (124.6 kg)  10/01/18 283 lb (128.4 kg)     Physical Exam  Constitutional: He is oriented to person, place, and time. No distress.   HENT:  Head: Normocephalic and atraumatic.  Neck: Neck supple. No thyromegaly present.  Cardiovascular: Normal rate, regular rhythm, S1 normal and S2 normal.  No murmur heard. Pulmonary/Chest: Breath sounds normal. He has no rales.  Abdominal: Soft. There is no hepatomegaly.  Musculoskeletal:        General: No edema.  Lymphadenopathy:    He has no cervical adenopathy.  Neurological: He is alert and oriented to person, place, and time.  Skin: Skin is warm and dry.    ASSESSMENT & PLAN:    Coronary artery disease involving native coronary artery of native heart without angina pectoris  Hx of NSTEMI in 2012 tx with DES to the LAD, POBA to the Dx and staged PCI with DES to the R Post AV segment of the RCA.  Myoview in 2017 was low risk.  He is not having any anginal symptoms.  Continue ASA, statin.   Hyperlipidemia, unspecified hyperlipidemia type LDL optimal on most recent lab work.  Continue current Rx.    Multiple sclerosis (HCC) He is followed chronically by neurology.   Dispo:  Return in about 1 year (around 02/21/2020) for Routine Follow Up, w/ Dr. Excell Seltzer, or [name], PA-C.   Medication Adjustments/Labs and Tests Ordered: Current medicines are reviewed at length with the patient today.  Concerns regarding medicines are outlined above.  Tests Ordered: Orders Placed This Encounter  Procedures   EKG 12-Lead   Medication Changes: Meds ordered this encounter  Medications   atorvastatin (LIPITOR) 40 MG tablet    Sig: Take 1 tablet (40 mg total) by mouth daily at 6 PM.    Dispense:  90 tablet    Refill:  3    Order Specific Question:   Supervising Provider    Answer:   Vesta Mixer [[id number]]    Signed, [name], PA-C  02/21/2019 11:30 AM    [facility] [address] [place], [place], [place]  [address] Phone: [phone]; Fax: [phone]

## 2019-02-21 NOTE — Telephone Encounter (Signed)
JCV ab negative, index: 0.14 

## 2019-02-24 ENCOUNTER — Telehealth: Payer: Self-pay | Admitting: *Deleted

## 2019-02-24 NOTE — Telephone Encounter (Signed)
Received fax notification from [employer] that pt approved for home infusion therapy with an implanted pump and supplies from 02/14/2019-04/15/2019. OFBP#[id number]. Pt ID#: [id number].  Pt recently received large bill from [facility] that dispenses medication for pump d/t not being covered with insurance.   I called [facility] and spoke with Alcario Drought. She is going to run a verification to see if pt eligible to have pump refilled with them. She will call me back prior to contacting the pt. If okay to proceed with them, they will need demographics sheet/telemetry from last refill faxed to them.  [facility] phone number: [phone].  Erica's extension: [phone]

## 2019-02-28 NOTE — Telephone Encounter (Addendum)
I called Keith Morrow back to get an update about pt being able to have pump managed through them.  She has sent for 2nd verification. She explained the insurance requires pt pay 20% copayment of whatever the total cost is. They will not know this until pump refilled and charges submitted. She is going to contact billing department and then contact pt to give him the information so he can contact insurance. Then he can decide if he wants to proceed with them.

## 2019-03-01 ENCOUNTER — Other Ambulatory Visit: Payer: Self-pay | Admitting: Family Medicine

## 2019-03-01 ENCOUNTER — Encounter: Payer: Self-pay | Admitting: Family Medicine

## 2019-03-03 NOTE — Telephone Encounter (Signed)
Tried calling [facility] to get an update but their office does not open until 9am, will try again later

## 2019-03-03 NOTE — Telephone Encounter (Signed)
Cicero Duck returned RN's call

## 2019-03-03 NOTE — Telephone Encounter (Signed)
Keith Morrow- can you help with this? Thank you!  I called Erika back. She spoke with pt and he would like to proceed with the referral to them. She needs last telemetry  (pump refill download), MD notes, demographic sheet faxed to them at [phone]. Advised I am working from home. I will ask sandy, RN to take care of this. After they receive, they will send back orders for Dr. Epimenio Foot to sign.

## 2019-03-03 NOTE — Telephone Encounter (Signed)
Called, spoke with [place]. Keith Morrow at lunch. I was connected to her VM and left message for her to call back.

## 2019-03-03 NOTE — Telephone Encounter (Signed)
Fax confirmation received to [facility] - Erica (demographics, last ofv note, pump refill note).  [phone].

## 2019-03-03 NOTE — Telephone Encounter (Signed)
Erika from [facility] called back and asked for a call back at [phone]

## 2019-03-04 ENCOUNTER — Ambulatory Visit (INDEPENDENT_AMBULATORY_CARE_PROVIDER_SITE_OTHER): Payer: 59 | Admitting: Psychology

## 2019-03-04 DIAGNOSIS — F4323 Adjustment disorder with mixed anxiety and depressed mood: Secondary | ICD-10-CM

## 2019-03-05 ENCOUNTER — Other Ambulatory Visit: Payer: Self-pay | Admitting: Family Medicine

## 2019-03-10 ENCOUNTER — Telehealth: Payer: Self-pay | Admitting: *Deleted

## 2019-03-10 NOTE — Telephone Encounter (Signed)
Correction to fax number: [phone]. Fax still failed.  I called [facility] at [phone] and spoke with [facility]. She wants me to try fax: [phone]

## 2019-03-10 NOTE — Telephone Encounter (Signed)
Tried faxing signed letter twice but failed each time

## 2019-03-10 NOTE — Telephone Encounter (Signed)
Needs letter of medical necessity signed then faxed back to [facility], Elijio Miles [phone].

## 2019-03-10 NOTE — Telephone Encounter (Signed)
Per Andrey Campanile, RN she re-faxed and received fax confirmation on original fax number provided.

## 2019-03-10 NOTE — Telephone Encounter (Signed)
#   [phone].   

## 2019-03-10 NOTE — Telephone Encounter (Signed)
Alternate fax did not work, fax failed

## 2019-03-15 ENCOUNTER — Telehealth: Payer: Self-pay | Admitting: Neurology

## 2019-03-15 NOTE — Telephone Encounter (Signed)
Dr. Sater- FYI only 

## 2019-03-15 NOTE — Telephone Encounter (Signed)
Pt is now active with [facility] and Cicero Duck would like to inform us that the pt will be getting his pump filled at home.

## 2019-03-17 ENCOUNTER — Ambulatory Visit (INDEPENDENT_AMBULATORY_CARE_PROVIDER_SITE_OTHER): Payer: 59 | Admitting: Psychology

## 2019-03-17 ENCOUNTER — Institutional Professional Consult (permissible substitution): Payer: Self-pay | Admitting: Pulmonary Disease

## 2019-03-17 DIAGNOSIS — F4323 Adjustment disorder with mixed anxiety and depressed mood: Secondary | ICD-10-CM | POA: Diagnosis not present

## 2019-03-18 ENCOUNTER — Ambulatory Visit: Payer: 59 | Admitting: Psychology

## 2019-04-08 ENCOUNTER — Ambulatory Visit (INDEPENDENT_AMBULATORY_CARE_PROVIDER_SITE_OTHER): Payer: 59 | Admitting: Psychology

## 2019-04-08 DIAGNOSIS — F4323 Adjustment disorder with mixed anxiety and depressed mood: Secondary | ICD-10-CM

## 2019-04-16 ENCOUNTER — Other Ambulatory Visit: Payer: Self-pay | Admitting: Neurology

## 2019-04-22 ENCOUNTER — Ambulatory Visit (INDEPENDENT_AMBULATORY_CARE_PROVIDER_SITE_OTHER): Payer: 59 | Admitting: Psychology

## 2019-04-22 DIAGNOSIS — F4323 Adjustment disorder with mixed anxiety and depressed mood: Secondary | ICD-10-CM

## 2019-05-05 ENCOUNTER — Other Ambulatory Visit: Payer: Self-pay | Admitting: Family Medicine

## 2019-05-06 ENCOUNTER — Ambulatory Visit (INDEPENDENT_AMBULATORY_CARE_PROVIDER_SITE_OTHER): Payer: 59 | Admitting: Psychology

## 2019-05-06 ENCOUNTER — Encounter: Payer: Self-pay | Admitting: Family Medicine

## 2019-05-06 DIAGNOSIS — F4323 Adjustment disorder with mixed anxiety and depressed mood: Secondary | ICD-10-CM

## 2019-05-09 ENCOUNTER — Encounter: Payer: Self-pay | Admitting: Family Medicine

## 2019-05-20 ENCOUNTER — Ambulatory Visit (INDEPENDENT_AMBULATORY_CARE_PROVIDER_SITE_OTHER): Payer: 59 | Admitting: Psychology

## 2019-05-20 ENCOUNTER — Ambulatory Visit: Payer: 59 | Admitting: Psychology

## 2019-05-20 DIAGNOSIS — F4323 Adjustment disorder with mixed anxiety and depressed mood: Secondary | ICD-10-CM

## 2019-05-21 ENCOUNTER — Other Ambulatory Visit: Payer: Self-pay | Admitting: Family Medicine

## 2019-06-03 ENCOUNTER — Ambulatory Visit (INDEPENDENT_AMBULATORY_CARE_PROVIDER_SITE_OTHER): Payer: 59 | Admitting: Psychology

## 2019-06-03 DIAGNOSIS — F4323 Adjustment disorder with mixed anxiety and depressed mood: Secondary | ICD-10-CM

## 2019-06-11 ENCOUNTER — Other Ambulatory Visit: Payer: Self-pay | Admitting: Family Medicine

## 2019-06-14 ENCOUNTER — Telehealth: Payer: Self-pay | Admitting: *Deleted

## 2019-06-14 NOTE — Telephone Encounter (Signed)
Received letter in the mail from [facility] that baclofen pump refill approved for [phone] Home infusion therapy with an implanted pump and supplies. Authorized from 06/08/19-07/08/19. Authorization: [id number].  Customer ID: [id number].

## 2019-06-17 ENCOUNTER — Ambulatory Visit (INDEPENDENT_AMBULATORY_CARE_PROVIDER_SITE_OTHER): Payer: 59 | Admitting: Psychology

## 2019-06-17 DIAGNOSIS — F4323 Adjustment disorder with mixed anxiety and depressed mood: Secondary | ICD-10-CM

## 2019-06-30 ENCOUNTER — Other Ambulatory Visit: Payer: Self-pay | Admitting: *Deleted

## 2019-06-30 MED ORDER — METHYLPHENIDATE HCL 10 MG PO TABS: 10.0000 mg | ORAL_TABLET | Freq: Four times a day (QID) | ORAL | 0 refills | Status: DC

## 2019-07-01 ENCOUNTER — Ambulatory Visit (INDEPENDENT_AMBULATORY_CARE_PROVIDER_SITE_OTHER): Payer: 59 | Admitting: Psychology

## 2019-07-01 DIAGNOSIS — F4323 Adjustment disorder with mixed anxiety and depressed mood: Secondary | ICD-10-CM | POA: Diagnosis not present

## 2019-07-07 ENCOUNTER — Other Ambulatory Visit: Payer: Self-pay

## 2019-07-07 ENCOUNTER — Encounter: Payer: Self-pay | Admitting: Family Medicine

## 2019-07-07 ENCOUNTER — Telehealth: Payer: Self-pay | Admitting: *Deleted

## 2019-07-07 ENCOUNTER — Ambulatory Visit (INDEPENDENT_AMBULATORY_CARE_PROVIDER_SITE_OTHER): Payer: Managed Care, Other (non HMO) | Admitting: Family Medicine

## 2019-07-07 VITALS — BP 112/64 | HR 83 | Temp 98.4°F | Resp 18 | Wt 278.6 lb

## 2019-07-07 DIAGNOSIS — E785 Hyperlipidemia, unspecified: Secondary | ICD-10-CM | POA: Diagnosis not present

## 2019-07-07 DIAGNOSIS — R7989 Other specified abnormal findings of blood chemistry: Secondary | ICD-10-CM | POA: Diagnosis not present

## 2019-07-07 DIAGNOSIS — E349 Endocrine disorder, unspecified: Secondary | ICD-10-CM | POA: Diagnosis not present

## 2019-07-07 DIAGNOSIS — M79604 Pain in right leg: Secondary | ICD-10-CM

## 2019-07-07 DIAGNOSIS — E039 Hypothyroidism, unspecified: Secondary | ICD-10-CM

## 2019-07-07 DIAGNOSIS — Z0000 Encounter for general adult medical examination without abnormal findings: Secondary | ICD-10-CM | POA: Diagnosis not present

## 2019-07-07 DIAGNOSIS — K219 Gastro-esophageal reflux disease without esophagitis: Secondary | ICD-10-CM

## 2019-07-07 DIAGNOSIS — I10 Essential (primary) hypertension: Secondary | ICD-10-CM | POA: Diagnosis not present

## 2019-07-07 DIAGNOSIS — F418 Other specified anxiety disorders: Secondary | ICD-10-CM

## 2019-07-07 LAB — TESTOSTERONE: Testosterone: 241.95 ng/dL — ABNORMAL LOW (ref 300.00–890.00)

## 2019-07-07 LAB — CBC
HCT: 42.1 % (ref 39.0–52.0)
Hemoglobin: 14 g/dL (ref 13.0–17.0)
MCHC: 33.1 g/dL (ref 30.0–36.0)
MCV: 89.5 fl (ref 78.0–100.0)
Platelets: 180 10*3/uL (ref 150.0–400.0)
RBC: 4.71 Mil/uL (ref 4.22–5.81)
RDW: 14.8 % (ref 11.5–15.5)
WBC: 7.4 10*3/uL (ref 4.0–10.5)

## 2019-07-07 LAB — LIPID PANEL
Cholesterol: 133 mg/dL (ref 0–200)
HDL: 53.1 mg/dL (ref >39.00)
LDL Cholesterol: 73 mg/dL (ref 0–99)
NonHDL: 80.31
Total CHOL/HDL Ratio: 3
Triglycerides: 38 mg/dL (ref 0.0–149.0)
VLDL: 7.6 mg/dL (ref 0.0–40.0)

## 2019-07-07 LAB — COMPREHENSIVE METABOLIC PANEL
ALT: 20 U/L (ref 0–53)
AST: 15 U/L (ref 0–37)
Albumin: 4.2 g/dL (ref 3.5–5.2)
Alkaline Phosphatase: 67 U/L (ref 39–117)
BUN: 19 mg/dL (ref 6–23)
CO2: 31 mEq/L (ref 19–32)
Calcium: 9 mg/dL (ref 8.4–10.5)
Chloride: 105 mEq/L (ref 96–112)
Creatinine, Ser: 0.85 mg/dL (ref 0.40–1.50)
GFR: 96.88 mL/min (ref >60.00)
Glucose, Bld: 92 mg/dL (ref 70–99)
Potassium: 4.3 mEq/L (ref 3.5–5.1)
Sodium: 143 mEq/L (ref 135–145)
Total Bilirubin: 0.6 mg/dL (ref 0.2–1.2)
Total Protein: 6.3 g/dL (ref 6.0–8.3)

## 2019-07-07 LAB — TSH: TSH: 0.52 u[IU]/mL (ref 0.35–4.50)

## 2019-07-07 MED ORDER — METHYLPREDNISOLONE 4 MG PO TABS: ORAL_TABLET | ORAL | 1 refills | Status: DC

## 2019-07-07 MED ORDER — SUCRALFATE 1 GM/10ML PO SUSP: 1.0000 g | Freq: Three times a day (TID) | ORAL | 3 refills | Status: DC

## 2019-07-07 MED ORDER — TRIAMCINOLONE ACETONIDE 0.1 % EX CREA: 1.0000 "application " | TOPICAL_CREAM | Freq: Two times a day (BID) | CUTANEOUS | 2 refills | Status: DC

## 2019-07-07 MED ORDER — FAMOTIDINE 40 MG PO TABS: 40.0000 mg | ORAL_TABLET | Freq: Every day | ORAL | 3 refills | Status: DC

## 2019-07-07 NOTE — Telephone Encounter (Signed)
Faxed completed/signed Tysabri pt status report and reauth questionnaire to [facility] at [phone]. Received confirmation.  

## 2019-07-07 NOTE — Patient Instructions (Addendum)
[place]el Astringent as needed for poison ivy  Encouraged increased hydration and fiber in diet. Daily probiotics. If bowels not moving can use MOM 2 tbls po in 4 oz of warm prune juice by mouth every 2-3 days. If no results then repeat in 4 hours with  Dulcolax suppository pr, may repeat again in 4 more hours as needed. Seek care if symptoms worsen. Consider daily Miralax and/or Dulcolax if symptoms persist.    Preventive Care 42-46 Years Old, Male Preventive care refers to lifestyle choices and visits with your health care provider that can promote health and wellness. This includes:  A yearly physical exam. This is also called an annual well check.  Regular dental and eye exams.  Immunizations.  Screening for certain conditions.  Healthy lifestyle choices, such as eating a healthy diet, getting regular exercise, not using drugs or products that contain nicotine and tobacco, and limiting alcohol use. What can I expect for my preventive care visit? Physical exam Your health care provider will check:  Height and weight. These may be used to calculate body mass index (BMI), which is a measurement that tells if you are at a healthy weight.  Heart rate and blood pressure.  Your skin for abnormal spots. Counseling Your health care provider may ask you questions about:  Alcohol, tobacco, and drug use.  Emotional well-being.  Home and relationship well-being.  Sexual activity.  Eating habits.  Work and work Statistician. What immunizations do I need?  Influenza (flu) vaccine  This is recommended every year. Tetanus, diphtheria, and pertussis (Tdap) vaccine  You may need a Td booster every 10 years. Varicella (chickenpox) vaccine  You may need this vaccine if you have not already been vaccinated. Zoster (shingles) vaccine  You may need this after age 59. Measles, mumps, and rubella (MMR) vaccine  You may need at least one dose of MMR if you were born in 1957 or  later. You may also need a second dose. Pneumococcal conjugate (PCV13) vaccine  You may need this if you have certain conditions and were not previously vaccinated. Pneumococcal polysaccharide (PPSV23) vaccine  You may need one or two doses if you smoke cigarettes or if you have certain conditions. Meningococcal conjugate (MenACWY) vaccine  You may need this if you have certain conditions. Hepatitis A vaccine  You may need this if you have certain conditions or if you travel or work in places where you may be exposed to hepatitis A. Hepatitis B vaccine  You may need this if you have certain conditions or if you travel or work in places where you may be exposed to hepatitis B. Haemophilus influenzae type b (Hib) vaccine  You may need this if you have certain risk factors. Human papillomavirus (HPV) vaccine  If recommended by your health care provider, you may need three doses over 6 months. You may receive vaccines as individual doses or as more than one vaccine together in one shot (combination vaccines). Talk with your health care provider about the risks and benefits of combination vaccines. What tests do I need? Blood tests  Lipid and cholesterol levels. These may be checked every 5 years, or more frequently if you are over 66 years old.  Hepatitis C test.  Hepatitis B test. Screening  Lung cancer screening. You may have this screening every year starting at age 72 if you have a 30-pack-year history of smoking and currently smoke or have quit within the past 15 years.  Prostate cancer screening. Recommendations will  vary depending on your family history and other risks.  Colorectal cancer screening. All adults should have this screening starting at age 27 and continuing until age 59. Your health care provider may recommend screening at age 28 if you are at increased risk. You will have tests every 1-10 years, depending on your results and the type of screening  test.  Diabetes screening. This is done by checking your blood sugar (glucose) after you have not eaten for a while (fasting). You may have this done every 1-3 years.  Sexually transmitted disease (STD) testing. Follow these instructions at home: Eating and drinking  Eat a diet that includes fresh fruits and vegetables, whole grains, lean protein, and low-fat dairy products.  Take vitamin and mineral supplements as recommended by your health care provider.  Do not drink alcohol if your health care provider tells you not to drink.  If you drink alcohol: ? Limit how much you have to 0-2 drinks a day. ? Be aware of how much alcohol is in your drink. In the [place] one drink equals one 12 oz bottle of beer (355 mL), one 5 oz glass of wine (148 mL), or one 1 oz glass of hard liquor (44 mL). Lifestyle  Take daily care of your teeth and gums.  Stay active. Exercise for at least 30 minutes on 5 or more days each week.  Do not use any products that contain nicotine or tobacco, such as cigarettes, e-cigarettes, and chewing tobacco. If you need help quitting, ask your health care provider.  If you are sexually active, practice safe sex. Use a condom or other form of protection to prevent STIs (sexually transmitted infections).  Talk with your health care provider about taking a low-dose aspirin every day starting at age 70. What's next?  Go to your health care provider once a year for a well check visit.  Ask your health care provider how often you should have your eyes and teeth checked.  Stay up to date on all vaccines. This information is not intended to replace advice given to you by your health care provider. Make sure you discuss any questions you have with your health care provider. Document Released: 12/21/2015 Document Revised: 11/18/2018 Document Reviewed: 11/18/2018 Elsevier Patient Education  2020 [employer].

## 2019-07-07 NOTE — Assessment & Plan Note (Addendum)
Check testosterone will consider referral to urology if worsens.

## 2019-07-07 NOTE — Telephone Encounter (Signed)
Received fax notification from [facility] prescribing program that pt re-authorized for Tysabri from 07/07/19-02/02/20. Patient enrollment number: [id number]. Account: [facility]. Site auth number: [id number]

## 2019-07-10 DIAGNOSIS — M79604 Pain in right leg: Secondary | ICD-10-CM | POA: Insufficient documentation

## 2019-07-10 NOTE — Assessment & Plan Note (Signed)
Continues to struggle secondary to his health concerns but managing adequately with support of family and current treatments.

## 2019-07-10 NOTE — Assessment & Plan Note (Signed)
On Levothyroxine, continue to monitor 

## 2019-07-10 NOTE — Assessment & Plan Note (Signed)
Well controlled, no changes to meds. Encouraged heart healthy diet such as the DASH diet and exercise as tolerated.  °

## 2019-07-10 NOTE — Assessment & Plan Note (Signed)
Encouraged heart healthy diet, increase exercise, avoid trans fats, consider a krill oil cap daily, tolerating Atorvastatin 

## 2019-07-10 NOTE — Assessment & Plan Note (Signed)
He is essentially in his wheelchair now and is sitting to do his work more and more as he is not able to travel for work due to the pandemic so he is getting stiff, discussed strategies for moving and stretching every hour. He will discuss more fully with physiatry.

## 2019-07-10 NOTE — Assessment & Plan Note (Signed)
Patient encouraged to maintain heart healthy diet, regular exercise, adequate sleep. Consider daily probiotics. Take medications as prescribed. Labs ordered and reviewed 

## 2019-07-10 NOTE — Progress Notes (Signed)
Subjective:    Patient ID: Keith Morrow, male    DOB: October 10, 1971, 48 y.o.   MRN: [id number]  No chief complaint on file.   HPI Patient is in today for annual preventative exam and follow-up on chronic medical concerns including hypertension, hyperlipidemia and more.  His multiple sclerosis continues to progress and he is now essentially wheelchair-bound.  Due to his work during the pandemic he is working from home and staying in one sitting position for longer each day.  As result he is having a good bit of tightness and pain in his right leg from the hip down.  When he changes positions he does get some improvement.  No complaints of recent fall or injury.  No recent febrile illness or hospitalizations.  His wife is with him and confirms.  They are maintaining quarantine well. Denies CP/palp/SOB/HA/congestion/fevers/GI or GU c/o. Taking meds as prescribed  Past Medical History:  Diagnosis Date  . Acute bronchitis 05/09/2013  . Allergy   . Bronchitis, acute 12/10/2011  . Candidal skin infection 05/05/2017  . Chicken pox as a child  . Constipation 02/20/2017  . Coronary atherosclerosis of native coronary artery    a. NSTEMI 04/20/11: tx with Promus DES to LAD; bifurcation lesion with oDx 90% tx with POBA; b. staged PCI of right post. AV Branch with Promus DES; c. residual at cath 04/21/11:  AV groove CFX 50%, mRCA 30%,; EF 55%  . Depression   . Depression with anxiety   . Dermatitis, contact 12/10/2011  . Fatigue 10/15/2012  . Heart attack (HCC) 04-20-11  . HTN (hypertension)   . Hyperlipidemia   . Hypotestosteronism 11/17/2011  . Hypothyroidism   . Keloid scar   . MS (multiple sclerosis) (HCC)   . Neuromuscular disorder (HCC)    NUMBNESS/TINGLING  . Obesity   . Onychomycosis   . Preventative health care 10/07/2011  . Reflux 10/07/2011  . Sun-damaged skin 02/17/2016  . Testosterone deficiency 01/16/2012  . Tinea pedis   . 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 Visual changes  11/13/2016    Past Surgical History:  Procedure Laterality Date  . CORONARY ANGIOPLASTY WITH STENT PLACEMENT     2012  . PAIN PUMP IMPLANTATION N/A 08/29/2014   Procedure: Baclofen pump placement;  Surgeon: Maeola HarmanJoseph Stern, MD;  Location: [facility];  Service: Neurosurgery;  Laterality: N/A;  Baclofen pump placement  . PROGRAMABLE BACLOFEN PUMP REVISION  08/29/14  . right wrist surgery     CTR  . VASCULAR SURGERY     vericose vein in right femoral area  . VASECTOMY    . WISDOM TOOTH EXTRACTION      Family History  Problem Relation Age of Onset  . Heart attack Mother [id number]  . Thyroid disease Mother   . Heart disease Mother   . Thyroid disease Sister   . Thyroid disease Brother   . Heart disease Maternal Grandmother   . Hypertension Maternal Grandmother   . Hyperlipidemia Maternal Grandmother   . Heart disease Maternal Grandfather   . Hypertension Maternal Grandfather   . Hyperlipidemia Maternal Grandfather   . Stroke Paternal Grandfather   . Thyroid disease Sister   . Heart disease Father        2 stents  . Coronary artery disease Other        questionable in father    Social History   Socioeconomic History  . Marital status: Married    Spouse name: Not on file  .  Number of children: 3  . Years of education: Not on file  . Highest education level: Not on file  Occupational History  . Not on file  Social Needs  . Financial resource strain: Not on file  . Food insecurity    Worry: Not on file    Inability: Not on file  . Transportation needs    Medical: Not on file    Non-medical: Not on file  Tobacco Use  . Smoking status: Never Smoker  . Smokeless tobacco: Never Used  Substance and Sexual Activity  . Alcohol use: Yes    Comment: socially  . Drug use: No  . Sexual activity: Yes    Partners: Female    Comment: 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, works [employer], no dietary restrictions  Lifestyle  . Physical activity    Days per week: Not on file     Minutes per session: Not on file  . Stress: Not on file  Relationships  . Social Musicianconnections    Talks on phone: Not on file    Gets together: Not on file    Attends religious service: Not on file    Active member of club or organization: Not on file    Attends meetings of clubs or organizations: Not on file    Relationship status: Not on file  . Intimate partner violence    Fear of current or ex partner: Not on file    Emotionally abused: Not on file    Physically abused: Not on file    Forced sexual activity: Not on file  Other Topics Concern  . Not on file  Social History Narrative  . Not on file    Outpatient Medications Prior to Visit  Medication Sig Dispense Refill  . aspirin EC 81 MG tablet Take 81 mg by mouth daily.    Marland Kitchen. atorvastatin (LIPITOR) 40 MG tablet Take 1 tablet (40 mg total) by mouth daily at 6 PM. 90 tablet 3  . baclofen (LIORESAL) 10 MG tablet Take 1 tablet (10 mg total) by mouth 3 (three) times daily as needed for muscle spasms. 90 each 5  . BACLOFEN IT 1 Dose by Intrathecal route as directed.     . cetirizine (ZYRTEC) 10 MG tablet Take 10 mg by mouth daily as needed for allergies.     . clobetasol cream (TEMOVATE) 0.05 % Apply 1 application topically 2 (two) times daily as needed (skin).    . clonazePAM (KLONOPIN) 1 MG tablet TAKE 1 TABLET BY MOUTH 4 TIMES A DAY 360 tablet 1  . Clonidine HCl POWD by Does not apply route. 720mg /ml IT Baclofen. Total dose 157.89mg /day. Last pump refill 06/02/16    . clotrimazole-betamethasone (LOTRISONE) cream Apply 1 application topically 2 (two) times daily as needed (anti-fungal). 30 g 2  . cyclobenzaprine (FLEXERIL) 10 MG tablet Take 1 tablet (10 mg total) by mouth 3 (three) times daily as needed for muscle spasms. 90 tablet 11  . CYMBALTA 60 MG capsule TAKE 1 CAPSULE (60 MG TOTAL) BY MOUTH 2 (TWO) TIMES DAILY. 180 capsule 1  . dalfampridine (AMPYRA) 10 MG TB12 Take 1 tablet (10 mg total) by mouth every 12 (twelve) hours. 180  tablet 3  . gabapentin (NEURONTIN) 300 MG capsule TAKE ONE CAPSULE BY MOUTH IN THE MORNING ONE CAP BY MOUTH IN THE EVENING AND 2 CAPS AT BEDTIME 360 capsule 1  . ibuprofen (ADVIL,MOTRIN) 200 MG tablet Take 200 mg by mouth every 6 (six) hours as needed  for moderate pain.    . methylphenidate (RITALIN) 10 MG tablet Take 1 tablet (10 mg total) by mouth 4 (four) times daily. 120 tablet 0  . natalizumab (TYSABRI) 300 MG/15ML injection Inject 300 mg into the vein every 28 (twenty-eight) days.     . nitroGLYCERIN (NITROSTAT) 0.4 MG SL tablet PLACE 1 TABLET UNDER THE TOUNGE EVERY 5 MINUTES AS NEEDED FOR CHEST PAIN (X 3 DOSES) 25 tablet 0  . nystatin cream (MYCOSTATIN) Apply 1 application topically as directed. 30 g 2  . olmesartan (BENICAR) 20 MG tablet TAKE 1 TABLET BY MOUTH EVERY DAY 90 tablet 1  . omeprazole (PRILOSEC) 40 MG capsule TAKE 1 CAPSULE BY MOUTH EVERY DAY 90 capsule 1  . sucralfate (CARAFATE) 1 g tablet Take 1 tablet (1 g total) by mouth 4 (four) times daily -  with meals and at bedtime. Take tablet or liquid but not both 120 tablet 1  . SYNTHROID 150 MCG tablet TAKE 1 TABLET (150 MCG TOTAL) BY MOUTH DAILY BEFORE BREAKFAST. 90 tablet 0  . tamsulosin (FLOMAX) 0.4 MG CAPS capsule TAKE 1 CAPSULE BY MOUTH EVERY DAY 90 capsule 3  . ranitidine (ZANTAC) 300 MG tablet TAKE 1 TABLET BY MOUTH EVERYDAY AT BEDTIME 90 tablet 1  . sucralfate (CARAFATE) 1 GM/10ML suspension Take 10 mLs (1 g total) by mouth 4 (four) times daily -  with meals and at bedtime. Take liquid or tablets but not both 420 mL 1   No facility-administered medications prior to visit.     No Known Allergies  Review of Systems  Constitutional: Negative for chills, fever and malaise/fatigue.  HENT: Negative for congestion and hearing loss.   Eyes: Negative for discharge.  Respiratory: Negative for cough, sputum production and shortness of breath.   Cardiovascular: Negative for chest pain, palpitations and leg swelling.   Gastrointestinal: Negative for abdominal pain, blood in stool, constipation, diarrhea, heartburn, nausea and vomiting.  Genitourinary: Negative for dysuria, frequency, hematuria and urgency.  Musculoskeletal: Negative for back pain, falls and myalgias.  Skin: Negative for rash.  Neurological: Negative for dizziness, sensory change, loss of consciousness, weakness and headaches.  Endo/Heme/Allergies: Negative for environmental allergies. Does not bruise/bleed easily.  Psychiatric/Behavioral: Negative for depression and suicidal ideas. The patient is not nervous/anxious and does not have insomnia.        Objective:    Physical Exam Vitals signs and nursing note reviewed.  Constitutional:      General: He is not in acute distress.    Appearance: He is well-developed. He is obese. He is not ill-appearing.  HENT:     Head: Normocephalic and atraumatic.     Right Ear: Tympanic membrane, ear canal and external ear normal.     Left Ear: Tympanic membrane, ear canal and external ear normal.     Nose: Nose normal.     Mouth/Throat:     Mouth: Mucous membranes are moist.  Eyes:     General:        Right eye: No discharge.        Left eye: No discharge.     Conjunctiva/sclera: Conjunctivae normal.     Pupils: Pupils are equal, round, and reactive to light.  Neck:     Musculoskeletal: Normal range of motion and neck supple.  Cardiovascular:     Rate and Rhythm: Normal rate and regular rhythm.     Heart sounds: No murmur.  Pulmonary:     Effort: Pulmonary effort is normal.  Breath sounds: Normal breath sounds.  Abdominal:     General: Bowel sounds are normal.     Palpations: Abdomen is soft.     Tenderness: There is no abdominal tenderness. There is no guarding.  Skin:    General: Skin is warm and dry.  Neurological:     Mental Status: He is alert and oriented to person, place, and time.     Gait: Gait abnormal.     Comments: In wheelchair with spasticity.     BP 112/64 (BP  Location: Left Arm, Patient Position: Sitting, Cuff Size: Normal)   Pulse 83   Temp 98.4 F (36.9 C) (Oral)   Resp 18   Wt 278 lb 9.6 oz (126.4 kg)   SpO2 99%   BMI 36.76 kg/m  Wt Readings from Last 3 Encounters:  07/07/19 278 lb 9.6 oz (126.4 kg)  02/21/19 283 lb 6.4 oz (128.5 kg)  01/14/19 274 lb 9.6 oz (124.6 kg)    Diabetic Foot Exam - Simple   No data filed     Lab Results  Component Value Date   WBC 7.4 07/07/2019   HGB 14.0 07/07/2019   HCT 42.1 07/07/2019   PLT 180.0 07/07/2019   GLUCOSE 92 07/07/2019   CHOL 133 07/07/2019   TRIG 38.0 07/07/2019   HDL 53.10 07/07/2019   LDLCALC 73 07/07/2019   ALT 20 07/07/2019   AST 15 07/07/2019   NA 143 07/07/2019   K 4.3 07/07/2019   CL 105 07/07/2019   CREATININE 0.85 07/07/2019   BUN 19 07/07/2019   CO2 31 07/07/2019   TSH 0.52 07/07/2019   PSA 0.52 02/11/2016   INR 1.00 04/21/2011    Lab Results  Component Value Date   TSH 0.52 07/07/2019   Lab Results  Component Value Date   WBC 7.4 07/07/2019   HGB 14.0 07/07/2019   HCT 42.1 07/07/2019   MCV 89.5 07/07/2019   PLT 180.0 07/07/2019   Lab Results  Component Value Date   NA 143 07/07/2019   K 4.3 07/07/2019   CO2 31 07/07/2019   GLUCOSE 92 07/07/2019   BUN 19 07/07/2019   CREATININE 0.85 07/07/2019   BILITOT 0.6 07/07/2019   ALKPHOS 67 07/07/2019   AST 15 07/07/2019   ALT 20 07/07/2019   PROT 6.3 07/07/2019   ALBUMIN 4.2 07/07/2019   CALCIUM 9.0 07/07/2019   ANIONGAP 10 12/07/2015   GFR 96.88 07/07/2019   Lab Results  Component Value Date   CHOL 133 07/07/2019   Lab Results  Component Value Date   HDL 53.10 07/07/2019   Lab Results  Component Value Date   LDLCALC 73 07/07/2019   Lab Results  Component Value Date   TRIG 38.0 07/07/2019   Lab Results  Component Value Date   CHOLHDL 3 07/07/2019   No results found for: HGBA1C     Assessment & Plan:   Problem List Items Addressed This Visit    Hyperlipidemia - Primary     Encouraged heart healthy diet, increase exercise, avoid trans fats, consider a krill oil cap daily, tolerating Atorvastatin      Relevant Orders   Lipid panel (Completed)   Hypothyroidism    On Levothyroxine, continue to monitor      HTN (hypertension)    Well controlled, no changes to meds. Encouraged heart healthy diet such as the DASH diet and exercise as tolerated.       Relevant Orders   CBC (Completed)   Comprehensive metabolic  panel (Completed)   TSH (Completed)   Depression with anxiety    Continues to struggle secondary to his health concerns but managing adequately with support of family and current treatments.       Preventative health care    Patient encouraged to maintain heart healthy diet, regular exercise, adequate sleep. Consider daily probiotics. Take medications as prescribed. Labs ordered and reviewed.       GERD (gastroesophageal reflux disease)   Relevant Medications   sucralfate (CARAFATE) 1 GM/10ML suspension   famotidine (PEPCID) 40 MG tablet   Hypotestosteronism    Mildly low. No treatment for now.       Low testosterone    Check testosterone will consider referral to urology if worsens.       Relevant Orders   Testosterone (Completed)   Right leg pain    He is essentially in his wheelchair now and is sitting to do his work more and more as he is not able to travel for work due to the pandemic so he is getting stiff, discussed strategies for moving and stretching every hour. He will discuss more fully with physiatry.          I have discontinued Jourdin Magrath. Lorenzetti's ranitidine. I am also having him start on methylPREDNISolone, famotidine, and triamcinolone cream. Additionally, I am having him maintain his cetirizine, aspirin EC, ibuprofen, BACLOFEN IT, natalizumab, Clonidine HCl, clotrimazole-betamethasone, nitroGLYCERIN, clobetasol cream, nystatin cream, cyclobenzaprine, clonazePAM, sucralfate, olmesartan, dalfampridine, baclofen, atorvastatin,  tamsulosin, gabapentin, omeprazole, Cymbalta, Synthroid, methylphenidate, and sucralfate.  Meds ordered this encounter  Medications  . sucralfate (CARAFATE) 1 GM/10ML suspension    Sig: Take 10 mLs (1 g total) by mouth 4 (four) times daily -  with meals and at bedtime. Take liquid or tablets but not both    Dispense:  420 mL    Refill:  3  . methylPREDNISolone (MEDROL) 4 MG tablet    Sig: 5 tab po qd X 1d then 4 tab po qd X 1d then 3 tab po qd X 1d then 2 tab po qd then 1 tab po qd    Dispense:  15 tablet    Refill:  1  . famotidine (PEPCID) 40 MG tablet    Sig: Take 1 tablet (40 mg total) by mouth at bedtime.    Dispense:  90 tablet    Refill:  3  . triamcinolone cream (KENALOG) 0.1 %    Sig: Apply 1 application topically 2 (two) times daily.    Dispense:  30 g    Refill:  2     [name], MD

## 2019-07-10 NOTE — Assessment & Plan Note (Signed)
Mildly low. No treatment for now.

## 2019-07-15 ENCOUNTER — Ambulatory Visit (INDEPENDENT_AMBULATORY_CARE_PROVIDER_SITE_OTHER): Payer: 59 | Admitting: Psychology

## 2019-07-15 DIAGNOSIS — F4323 Adjustment disorder with mixed anxiety and depressed mood: Secondary | ICD-10-CM

## 2019-07-16 ENCOUNTER — Emergency Department (HOSPITAL_BASED_OUTPATIENT_CLINIC_OR_DEPARTMENT_OTHER)
Admission: EM | Admit: 2019-07-16 | Discharge: 2019-07-16 | Disposition: A | Discharge status: Home / Self Care | Payer: Managed Care, Other (non HMO) | Attending: Emergency Medicine | Admitting: Emergency Medicine

## 2019-07-16 ENCOUNTER — Encounter (HOSPITAL_BASED_OUTPATIENT_CLINIC_OR_DEPARTMENT_OTHER): Payer: Self-pay | Admitting: Emergency Medicine

## 2019-07-16 ENCOUNTER — Other Ambulatory Visit: Payer: Self-pay

## 2019-07-16 DIAGNOSIS — T148XXA Other injury of unspecified body region, initial encounter: Secondary | ICD-10-CM | POA: Diagnosis not present

## 2019-07-16 DIAGNOSIS — I10 Essential (primary) hypertension: Secondary | ICD-10-CM | POA: Diagnosis not present

## 2019-07-16 DIAGNOSIS — S0101XA Laceration without foreign body of scalp, initial encounter: Secondary | ICD-10-CM

## 2019-07-16 DIAGNOSIS — Y9389 Activity, other specified: Secondary | ICD-10-CM | POA: Diagnosis not present

## 2019-07-16 DIAGNOSIS — Y929 Unspecified place or not applicable: Secondary | ICD-10-CM | POA: Insufficient documentation

## 2019-07-16 DIAGNOSIS — S0990XA Unspecified injury of head, initial encounter: Secondary | ICD-10-CM

## 2019-07-16 DIAGNOSIS — W01190A Fall on same level from slipping, tripping and stumbling with subsequent striking against furniture, initial encounter: Secondary | ICD-10-CM | POA: Diagnosis not present

## 2019-07-16 DIAGNOSIS — I2510 Atherosclerotic heart disease of native coronary artery without angina pectoris: Secondary | ICD-10-CM | POA: Insufficient documentation

## 2019-07-16 DIAGNOSIS — Y999 Unspecified external cause status: Secondary | ICD-10-CM | POA: Insufficient documentation

## 2019-07-16 DIAGNOSIS — E039 Hypothyroidism, unspecified: Secondary | ICD-10-CM | POA: Insufficient documentation

## 2019-07-16 DIAGNOSIS — W19XXXA Unspecified fall, initial encounter: Secondary | ICD-10-CM

## 2019-07-16 NOTE — ED Notes (Signed)
Patient denies any neck pain from fall

## 2019-07-16 NOTE — ED Triage Notes (Signed)
Pts wife reports hx of MS for pt. Wife states pt was standing to pivot to walker from wheelchair and fell. Pt reports lac and "knot" to back of head and right side of head. Bleeding controlled. Denies neck pain, denies loc. Pt also reports scrape to right elbow.

## 2019-07-17 NOTE — ED Provider Notes (Signed)
Emergency Department Provider Note   I have reviewed the triage vital signs and the nursing notes.   HISTORY  Chief Complaint Fall   HPI Keith Morrow is a 48 y.o. male with history of MS and multiple other medical problems documented below who presents the emergency department today after a fall.  Patient was try to transfer between his rocking chair and his wheelchair when he lost balance and fell which is not abnormal for him.  He hit the back of his head on a chair however suffered a laceration there and also abrasions to his right elbow and the right side of his head.  No syncope, vomiting, severe headache or neurologic changes.  No other associate symptoms such as neck pain, back pain or extremity pain.   No other associated or modifying symptoms.    Past Medical History:  Diagnosis Date  . Acute bronchitis 05/09/2013  . Allergy   . Bronchitis, acute 12/10/2011  . Candidal skin infection 05/05/2017  . Chicken pox as a child  . Constipation 02/20/2017  . Coronary atherosclerosis of native coronary artery    a. NSTEMI 04/20/11: tx with Promus DES to LAD; bifurcation lesion with oDx 90% tx with POBA; b. staged PCI of right post. AV Branch with Promus DES; c. residual at cath 04/21/11:  AV groove CFX 50%, mRCA 30%,; EF 55%  . Depression   . Depression with anxiety   . Dermatitis, contact 12/10/2011  . Fatigue 10/15/2012  . Heart attack ([place]) 04-20-11  . HTN (hypertension)   . Hyperlipidemia   . Hypotestosteronism 11/17/2011  . Hypothyroidism   . Keloid scar   . MS (multiple sclerosis) ([place])   . Neuromuscular disorder ([facility])    NUMBNESS/TINGLING  . Obesity   . Onychomycosis   . Preventative health care 10/07/2011  . Reflux 10/07/2011  . Sun-damaged skin 02/17/2016  . Testosterone deficiency 01/16/2012  . Tinea pedis   . 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 Visual changes 11/13/2016    Patient Active Problem List   Diagnosis Date Noted  . Right leg pain 07/10/2019   . Hypersomnolence 01/16/2019  . Left shoulder pain 08/23/2018  . BRBPR (bright red blood per rectum) 08/01/2018  . Skin lesion of scalp 08/01/2018  . Low testosterone 07/30/2018  . Constipation 02/20/2017  . Visual changes 11/13/2016  . Right hand weakness 10/17/2016  . Dysesthesia 06/02/2016  . Erectile dysfunction 02/17/2016  . Sun-damaged skin 02/17/2016  . Gait disturbance 12/24/2015  . Spasticity 12/24/2015  . Urinary urgency 12/24/2015  . Insomnia 12/24/2015  . Shortness of breath on exertion 10/10/2015  . Leg edema, right 05/15/2015  . Pedal edema 04/28/2015  . Dermatitis 03/06/2014  . Fatigue 10/15/2012  . Hypotestosteronism 11/17/2011  . Preventative health care 10/07/2011  . GERD (gastroesophageal reflux disease) 10/07/2011  . Obesity   . Depression with anxiety   . Onychomycosis   . Tinea pedis   . Keloid scar   . CAD (coronary artery disease)   . Multiple sclerosis ([place])   . Hyperlipidemia   . Hypothyroidism   . HTN (hypertension)     Past Surgical History:  Procedure Laterality Date  . CORONARY ANGIOPLASTY WITH STENT PLACEMENT     2012  . PAIN PUMP IMPLANTATION N/A 08/29/2014   Procedure: Baclofen pump placement;  Surgeon: Erline Levine, MD;  Location: [facility];  Service: Neurosurgery;  Laterality: N/A;  Baclofen pump placement  . PROGRAMABLE BACLOFEN PUMP REVISION  08/29/14  .  right wrist surgery     CTR  . VASCULAR SURGERY     vericose vein in right femoral area  . VASECTOMY    . WISDOM TOOTH EXTRACTION      Current Outpatient Rx  . Order #: [id number] Class: Historical Med  . Order #: [id number] Class: Normal  . Order #: [id number] Class: Normal  . Order #: [id number] Class: Historical Med  . Order #: [id number] Class: Historical Med  . Order #: [id number] Class: Historical Med  . Order #: [id number] Class: Print  . Order #: [id number] Class: Historical Med  . Order #: [id number] Class: Normal  . Order #: [id number] Class: Normal  . Order #: [id number] Class:  Normal  . Order #: [id number] Class: Normal  . Order #: [id number] Class: Normal  . Order #: [id number] Class: Normal  . Order #: [id number] Class: Historical Med  . Order #: [id number] Class: Normal  . Order #: [id number] Class: Normal  . Order #: [id number] Class: Historical Med  . Order #: [id number] Class: Normal  . Order #: [id number] Class: Normal  . Order #: [id number] Class: Normal  . Order #: [id number] Class: Normal  . Order #: [id number] Class: Print  . Order #: [id number] Class: Normal  . Order #: [id number] Class: Normal  . Order #: [id number] Class: Normal  . Order #: [id number] Class: Normal    Allergies Patient has no known allergies.  Family History  Problem Relation Age of Onset  . Heart attack Mother 5065  . Thyroid disease Mother   . Heart disease Mother   . Thyroid disease Sister   . Thyroid disease Brother   . Heart disease Maternal Grandmother   . Hypertension Maternal Grandmother   . Hyperlipidemia Maternal Grandmother   . Heart disease Maternal Grandfather   . Hypertension Maternal Grandfather   . Hyperlipidemia Maternal Grandfather   . Stroke Paternal Grandfather   . Thyroid disease Sister   . Heart disease Father        2 stents  . Coronary artery disease Other        questionable in father    Social History Social History   Tobacco Use  . Smoking status: Never Smoker  . Smokeless tobacco: Never Used  Substance Use Topics  . Alcohol use: Yes    Comment: socially  . Drug use: No    Review of Systems  All other systems negative except as documented in the HPI. All pertinent positives and negatives as reviewed in the HPI. ____________________________________________   PHYSICAL EXAM:  VITAL SIGNS: ED Triage Vitals  Enc Vitals Group     BP 07/16/19 2258 135/66     Pulse Rate 07/16/19 2258 76     Resp 07/16/19 2258 18     Temp 07/16/19 2258 98.6 F (37 C)     Temp Source 07/16/19 2258 Oral     SpO2 07/16/19 2258 96 %     Weight 07/16/19 2302 275 lb (124.7  kg)     Height 07/16/19 2302 6\' 3"  (1.905 m)    Constitutional: Alert and oriented. Well appearing and in no acute distress. Eyes: Conjunctivae are normal. PERRL. EOMI. Head: Atraumatic. Nose: No congestion/rhinnorhea. Mouth/Throat: Mucous membranes are moist.  Oropharynx non-erythematous. Neck: No stridor.  No meningeal signs.   Cardiovascular: Normal rate, regular rhythm. Good peripheral circulation. Grossly normal heart sounds.   Respiratory: Normal respiratory effort.  No retractions. Lungs CTAB. Gastrointestinal: Soft and nontender. No distention.  Musculoskeletal: No lower extremity tenderness nor edema. No gross deformities of extremities. Neurologic:  Normal speech and language. No gross focal neurologic deficits are appreciated.  Skin:  Skin  is warm, dry and intact. No rash noted.  Abrasion over right elbow, right knee both with associated ecchymosis.  Also has dried blood and small abrasion to right temple area.  He has a 1.5 cm laceration to posterior scalp. hemostatci  ____________________________________________   PROCEDURES  Procedure(s) performed:   Marland KitchenMarland KitchenLaceration Repair  Date/Time: 07/17/2019 12:55 AM Performed by: Marily Memos, MD Authorized by: Marily Memos, MD   Consent:    Consent obtained:  Verbal   Consent given by:  Patient   Risks discussed:  Infection, need for additional repair, nerve damage, poor wound healing, poor cosmetic result, pain, retained foreign body, tendon damage and vascular damage   Alternatives discussed:  No treatment, delayed treatment and observation Anesthesia (see MAR for exact dosages):    Anesthesia method:  None Laceration details:    Location:  Scalp   Scalp location:  Occipital   Length (cm):  1.5   Depth (mm):  3 Repair type:    Repair type:  Simple Pre-procedure details:    Preparation:  Patient was prepped and draped in usual sterile fashion and imaging obtained to evaluate for foreign bodies Exploration:    Wound  exploration: wound explored through full range of motion and entire depth of wound probed and visualized     Contaminated: no   Treatment:    Area cleansed with:  Saline and Shur-Clens   Amount of cleaning:  Extensive   Irrigation solution:  Sterile water   Irrigation volume:  100   Irrigation method:  Syringe   Visualized foreign bodies/material removed: no   Skin repair:    Repair method:  Sutures   Suture size:  4-0   Suture material:  Fast-absorbing gut   Suture technique:  Simple interrupted   Number of sutures:  2 Approximation:    Approximation:  Close Post-procedure details:    Dressing:  Antibiotic ointment   Patient tolerance of procedure:  Tolerated well, no immediate complications     ____________________________________________   INITIAL IMPRESSION / ASSESSMENT AND PLAN / ED COURSE  Wound repaired as above.  No indication for imaging.  Abrasions on other areas without evidence of fractures or lacerations.  Supportive care at home.  Stable for discharge.     Pertinent labs & imaging results that were available during my care of the patient were reviewed by me and considered in my medical decision making (see chart for details).   A medical screening exam was performed and I feel the patient has had an appropriate workup for their chief complaint at this time and likelihood of emergent condition existing is low. They have been counseled on decision, discharge, follow up and which symptoms necessitate immediate return to the emergency department. They or their family verbally stated understanding and agreement with plan and discharged in stable condition.   ____________________________________________  FINAL CLINICAL IMPRESSION(S) / ED DIAGNOSES  Final diagnoses:  Fall, initial encounter  Minor head injury, initial encounter  Laceration of scalp, initial encounter     MEDICATIONS GIVEN DURING THIS VISIT:  Medications - No data to display   NEW OUTPATIENT  MEDICATIONS STARTED DURING THIS VISIT:  Discharge Medication List as of 07/16/2019 11:47 PM      Note:  This note was prepared with assistance of Dragon voice recognition software. Occasional wrong-word or sound-a-like substitutions may have occurred due to the inherent limitations of voice recognition software.   Avaiah Stempel, Barbara Cower, MD 07/17/19 [phone]

## 2019-07-19 ENCOUNTER — Telehealth: Payer: Self-pay | Admitting: *Deleted

## 2019-07-19 NOTE — Telephone Encounter (Signed)
Received letter in the mail from [facility] that baclofen pump refill approved for [phone] Home infusion therapy with an implanted pump and supplies. Authorized from 07/09/19-01/07/19. Authorization: [id number]. Customer ID: [id number]. Sent copy to be scanned into epic.

## 2019-07-29 ENCOUNTER — Ambulatory Visit: Payer: 59 | Admitting: Psychology

## 2019-07-29 ENCOUNTER — Ambulatory Visit (INDEPENDENT_AMBULATORY_CARE_PROVIDER_SITE_OTHER): Payer: 59 | Admitting: Psychology

## 2019-07-29 DIAGNOSIS — F4323 Adjustment disorder with mixed anxiety and depressed mood: Secondary | ICD-10-CM

## 2019-08-12 ENCOUNTER — Ambulatory Visit (INDEPENDENT_AMBULATORY_CARE_PROVIDER_SITE_OTHER): Payer: 59 | Admitting: Psychology

## 2019-08-12 DIAGNOSIS — F4323 Adjustment disorder with mixed anxiety and depressed mood: Secondary | ICD-10-CM | POA: Diagnosis not present

## 2019-08-24 ENCOUNTER — Telehealth: Payer: Self-pay | Admitting: Neurology

## 2019-08-24 ENCOUNTER — Telehealth: Payer: Self-pay | Admitting: *Deleted

## 2019-08-24 NOTE — Telephone Encounter (Signed)
Called pt. He was agreeable to switch appt on 08/29/19 at 130pm with Dr. Felecia Shelling to Keith Presto, NP same day but at 1pm. He states bolus settings need to be adjusted for pump while he is here. He is going to send mychart message to detail this. He had pump last refilled 08/10/2019. He was a bolus between 5-6am because he wakes up at 630am.

## 2019-08-24 NOTE — Telephone Encounter (Signed)
Pt sent mychart message also. I replied via [employer].

## 2019-08-24 NOTE — Telephone Encounter (Signed)
Pt called stating that he is scheduled for his infusion tomorrow and is wanting to know if the nurse can set his Baclofen Pump to the correct settings since he will already be here. Please advise.

## 2019-08-26 ENCOUNTER — Ambulatory Visit (INDEPENDENT_AMBULATORY_CARE_PROVIDER_SITE_OTHER): Payer: 59 | Admitting: Psychology

## 2019-08-26 DIAGNOSIS — F4323 Adjustment disorder with mixed anxiety and depressed mood: Secondary | ICD-10-CM

## 2019-08-28 ENCOUNTER — Other Ambulatory Visit: Payer: Self-pay | Admitting: Neurology

## 2019-08-29 ENCOUNTER — Encounter: Payer: Self-pay | Admitting: Family Medicine

## 2019-08-29 ENCOUNTER — Other Ambulatory Visit: Payer: Self-pay

## 2019-08-29 ENCOUNTER — Ambulatory Visit: Payer: Self-pay | Admitting: Neurology

## 2019-08-29 ENCOUNTER — Ambulatory Visit (INDEPENDENT_AMBULATORY_CARE_PROVIDER_SITE_OTHER): Payer: Managed Care, Other (non HMO) | Admitting: Family Medicine

## 2019-08-29 VITALS — BP 116/78 | HR 84 | Temp 97.7°F | Ht 75.0 in | Wt 275.0 lb

## 2019-08-29 DIAGNOSIS — G35 Multiple sclerosis: Secondary | ICD-10-CM

## 2019-08-29 DIAGNOSIS — G35D Multiple sclerosis, unspecified: Secondary | ICD-10-CM

## 2019-08-29 DIAGNOSIS — R252 Cramp and spasm: Secondary | ICD-10-CM | POA: Diagnosis not present

## 2019-08-29 DIAGNOSIS — Z79899 Other long term (current) drug therapy: Secondary | ICD-10-CM

## 2019-08-29 DIAGNOSIS — R269 Unspecified abnormalities of gait and mobility: Secondary | ICD-10-CM | POA: Diagnosis not present

## 2019-08-29 NOTE — Progress Notes (Signed)
I have read the note, and I agree with the clinical assessment and plan.  Coe Angelos A. Sarrinah Gardin, MD, PhD, FAAN Certified in Neurology, Clinical Neurophysiology, Sleep Medicine, Pain Medicine and Neuroimaging  [facility] [address], [address] [place], [place] [address] [phone]  

## 2019-08-29 NOTE — Patient Instructions (Signed)
Please continue current medication regimen. I do recommend trying to space out medications in the morning to avoid the foggy feeling.   Continue PT and activity as tolerated  Try taking more breaks at work to stretch legs.   Follow up with Dr Felecia Shelling in 6 months    Multiple Sclerosis Multiple sclerosis (MS) is a disease of the brain, spinal cord, and optic nerves (central nervous system). It causes the body's disease-fighting (immune) system to destroy the protective covering (myelin sheath) around nerves in the brain. When this happens, signals (nerve impulses) going to and from the brain and spinal cord do not get sent properly or may not get sent at all. There are several types of MS:  Relapsing-remitting MS. This is the most common type. This causes sudden attacks of symptoms. After an attack, you may recover completely until the next attack, or some symptoms may remain permanently.  Secondary progressive MS. This usually develops after the onset of relapsing-remitting MS. Similar to relapsing-remitting MS, this type also causes sudden attacks of symptoms. Attacks may be less frequent, but symptoms slowly get worse (progress) over time.  Primary progressive MS. This causes symptoms that steadily progress over time. This type of MS does not cause sudden attacks of symptoms. The age of onset of MS varies, but it often develops between 58-39 years of age. MS is a lifelong (chronic) condition. There is no cure, but treatment can help slow down the progression of the disease. What are the causes? The cause of this condition is not known. What increases the risk? You are more likely to develop this condition if:  You are a woman.  You have a relative with MS. However, the condition is not passed from parent to child (inherited).  You have a lack (deficiency) of vitamin D.  You smoke. MS is more common in the [place] than in the [place]. What are the signs  or symptoms? Relapsing-remitting and secondary progressive MS cause symptoms to occur in episodes or attacks that may last weeks to months. There may be long periods between attacks in which there are almost no symptoms. Primary progressive MS causes symptoms to steadily progress after they develop. Symptoms of MS vary because of the many different ways it affects the central nervous system. The main symptoms include:  Vision problems and eye pain.  Numbness.  Weakness.  Inability to move your arms, hands, feet, or legs (paralysis).  Balance problems.  Shaking that you cannot control (tremors).  Muscle spasms.  Problems with thinking (cognitive changes). MS can also cause symptoms that are associated with the disease, but are not always the direct result of an MS attack. They may include:  Inability to control urination or bowel movements (incontinence).  Headaches.  Fatigue.  Inability to tolerate heat.  Emotional changes.  Depression.  Pain. How is this diagnosed? This condition is diagnosed based on:  Your symptoms.  A neurological exam. This involves checking central nervous system function, such as nerve function, reflexes, and coordination.  MRIs of the brain and spinal cord.  Lab tests, including a lumbar puncture that tests the fluid that surrounds the brain and spinal cord (cerebrospinal fluid).  Tests to measure the electrical activity of the brain in response to stimulation (evoked potentials). How is this treated? There is no cure for MS, but medicines can help decrease the number and frequency of attacks and help relieve nuisance symptoms. Treatment options may include:  Medicines that reduce the frequency  of attacks. These medicines may be given by injection, by mouth (orally), or through an IV.  Medicines that reduce inflammation (steroids). These may provide short-term relief of symptoms.  Medicines to help control pain, depression, fatigue, or  incontinence.  Vitamin D, if you have a deficiency.  Using devices to help you move around (assistive devices), such as braces, a cane, or a walker.  Physical therapy to strengthen and stretch your muscles.  Occupational therapy to help you with everyday tasks.  Alternative or complementary treatments such as exercise, massage, or acupuncture. Follow these instructions at home:  Take over-the-counter and prescription medicines only as told by your health care provider.  Do not drive or use heavy machinery while taking prescription pain medicine.  Use assistive devices as recommended by your physical therapist or your health care provider.  Exercise as directed by your health care provider.  Return to your normal activities as told by your health care provider. Ask your health care provider what activities are safe for you.  Reach out for support. Share your feelings with friends, family, or a support group.  Keep all follow-up visits as told by your health care provider and therapists. This is important. Where to find more information  National Multiple Sclerosis Society: https://www.nationalmssociety.org Contact a health care provider if:  You feel depressed.  You develop new pain or numbness.  You have tremors.  You have problems with sexual function. Get help right away if:  You develop paralysis.  You develop numbness.  You have problems with your bladder or bowel function.  You develop double vision.  You lose vision in one or both eyes.  You develop suicidal thoughts.  You develop severe confusion. If you ever feel like you may hurt yourself or others, or have thoughts about taking your own life, get help right away. You can go to your nearest emergency department or call:  Your local emergency services (911 in the [place]  A suicide crisis helpline, such as the National Suicide Prevention Lifeline at 773-172-9720. This is open 24 hours a day. Summary   Multiple sclerosis (MS) is a disease of the central nervous system that causes the body's immune system to destroy the protective covering (myelin sheath) around nerves in the brain.  There are 3 types of MS: relapsing-remitting, secondary progressive, and primary progressive. Relapsing-remitting and secondary progressive MS cause symptoms to occur in episodes or attacks that may last weeks to months. Primary progressive MS causes symptoms to steadily progress after they develop.  There is no cure for MS, but medicines can help decrease the number and frequency of attacks and help relieve nuisance symptoms. Treatment may also include physical or occupational therapy.  If you develop numbness, paralysis, vision problems, or other neurological symptoms, get help right away. This information is not intended to replace advice given to you by your health care provider. Make sure you discuss any questions you have with your health care provider. Document Released: 11/21/2000 Document Revised: 11/06/2017 Document Reviewed: 02/02/2017 Elsevier Patient Education  2020 [employer].

## 2019-08-29 NOTE — Progress Notes (Signed)
PATIENT: Keith Morrow DOB: 1970/12/13  REASON FOR VISIT: follow up HISTORY FROM: patient  Chief Complaint  Patient presents with   Follow-up    Rm 8, alone,  On tysabri   Multiple Sclerosis     HISTORY OF PRESENT ILLNESS: Today 08/29/19 Keith Morrow is a 48 y.o. male here today for follow up of MS. He continues Tysabri infusions monthly. Last infusion last week. He continues to have right> left lower extremity weakness. He is working with PT who also notes right sided weakness and inability to raise leg. He had to take a break for a few months due to COVID and he feels this has contributed to decreased strength. He resumed therapy in August. He has noticed more difficulty with typing. He notes specific difficulty with controlling right ring and fifth digit. This has worsened over the past year. He has adapted his typing style to accomodates. He continues Baclofen pump for spacticity with new bolus settings in place (10-11pm, 1-2am and 5-6am). He uses tablet form as needed. He takes cyclobenzaprine  at bedtime to help him rest at night. He is not certain it will help.  He takes clonazepam, gabapentin and Ritalin around 6:30am. By 9:30-9:45, he reports feeling foggy headed for about 45 minutes. He reports being in a seated position for work from 7am to 4pm. He usually only has 1-2 breaks to stand up. He is having more cramping in his legs. MRI in 09/2018 was stable.     HISTORY: (copied from Dr Bonnita Hollow note on 02/14/2019)  Keith Morrow is a 48 y.o. man with a relapsing form of multiple sclerosis who has right greater than left leg weakness and progressive difficulties with gait function.  Due to severe spasticity, a baclofen pump was placed in 2013.  Update 02/14/2019. The last visit, he reports no new exacerbations.  He does note that his gait is slightly worse and his range is reduced.  He is needing to sit down more with a walker than he did in the past.  He does have an electric  wheelchair he uses in the house when more tired and outside of the house at times.  He feels the spasticity does well for the most part but he does note more stiffness when the temperatures are cold.  He sometimes has difficulty relaxing 1 of both legs when he transfers in and out of a car.  In the past, when he was on higher dose of intrathecal baclofen, he became too weak.  We discussed using PR and baclofen pills for times when he notes more stiffness.  Update 08/23/2018: He denies any exacerbations but has noted a little more weakness in his legs.  He is on Tysabri 300 mg every 4 weeks and he tolerates it well.   The last JCV antibody titer was indeterminate at 0.2 but the inhibition assay was negative.  His main problem continues to be leg weakness and poor gait.  With a walker, he can use the walker about 100 feet without a stop.  Ampyra has helped and he noted getting worse in the day that he did not take his dose.  Due to the weakness in his legs he uses the arms quite a bit to help with his gait.  He sees physical therapy on a weekly basis.  Due to spasticity he is on a baclofen pump.  He is on a combination of baclofen and clonidine and the combination has helped more than baclofen alone.  He will occasionally get some phasic spasms, sometimes while walking and sometimes on getting into the car.  They do not occur at night.  He continues on a basal rate with boluses at 11 PM and 2 AM.   His dysesthesias in the chest are painful at times and gabapentin has helped.  He also gets some leg numbness at times.  He has some urinary frequency and rare urge incontinence.   Has some fatigue helped by Ritalin.  Update 02/15/2018:    He feels that his MS is stable and he has no further exacerbations.  He is on Tysabri.   He tolerates it well. We need to recheck the JCV Ab. It has been negative so far. He feels his gait is doing about the same. With the walker he is able to get between his core in the  office Around the house he usually does not have much trouble.   The baclofen pump has helped. He continues on a combination of  Baclofen and clonidine. He does note that he has more spasticity in the late evening and when he lays down at night.    Currently, he has a basal rate and two boluses at 11 PM and at 2 AM.   His other MS symptoms are doing about the same   08/24/2017: MS:   He is currently on Tysabri. He tolerates it well and there has been no definite exacerbations.    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                       Gait:   He is able to use a Rollator walker around the house and at work much of the time. However, he requires the electric wheelchair at other times. He is doing physical therapy once a week.  He is unable to walk without the Rollator due to the weakness in his legs, right greater than left.   Spasticity:   Before the baclofen pump, he would get both tonic and phasic spasms in the legs that would often be uncomfortable.   His spasticity is improved with the combination of baclofen and clonidine in his pump.  He has not had any recent episodes of his legs locking up. His legs are easily bent. He occasionally will get a mild cramping sensation in the right thigh. This occurs if he sits a long time. It is not associated with actual spasticity worsening.    REVIEW OF SYSTEMS: Out of a complete 14 system review of symptoms, the patient complains only of the following symptoms, muscle weakness, gait difficulty, and all other reviewed systems are negative.   ALLERGIES: No Known Allergies  HOME MEDICATIONS: Outpatient Medications Prior to Visit  Medication Sig Dispense Refill   aspirin EC 81 MG tablet Take 81 mg by mouth daily.     atorvastatin (LIPITOR) 40 MG tablet Take 1 tablet (40 mg total) by mouth daily at 6 PM. 90 tablet 3   baclofen (LIORESAL) 10 MG tablet Take 1 tablet (10 mg total) by mouth 3 (three) times daily as needed for muscle spasms. 90 each 5     BACLOFEN IT 1 Dose by Intrathecal route as directed.      cetirizine (ZYRTEC) 10 MG tablet Take 10 mg by mouth daily as needed for allergies.      clobetasol cream (TEMOVATE) 0.05 % Apply 1 application topically 2 (two) times daily as needed (skin).     clonazePAM (KLONOPIN)  1 MG tablet TAKE 1 TABLET BY MOUTH 4 TIMES A DAY 360 tablet 1   Clonidine HCl POWD by Does not apply route. 720mg /ml IT Baclofen. Total dose 157.89mg /day. Last pump refill 06/02/16     clotrimazole-betamethasone (LOTRISONE) cream Apply 1 application topically 2 (two) times daily as needed (anti-fungal). 30 g 2   cyclobenzaprine (FLEXERIL) 10 MG tablet Take 1 tablet (10 mg total) by mouth 3 (three) times daily as needed for muscle spasms. 90 tablet 11   CYMBALTA 60 MG capsule TAKE 1 CAPSULE (60 MG TOTAL) BY MOUTH 2 (TWO) TIMES DAILY. 180 capsule 1   dalfampridine (AMPYRA) 10 MG TB12 Take 1 tablet (10 mg total) by mouth every 12 (twelve) hours. 180 tablet 3   famotidine (PEPCID) 40 MG tablet Take 1 tablet (40 mg total) by mouth at bedtime. 90 tablet 3   gabapentin (NEURONTIN) 300 MG capsule TAKE ONE CAPSULE BY MOUTH IN THE MORNING ONE CAP BY MOUTH IN THE EVENING AND 2 CAPS AT BEDTIME 360 capsule 1   ibuprofen (ADVIL,MOTRIN) 200 MG tablet Take 200 mg by mouth every 6 (six) hours as needed for moderate pain.     methylphenidate (RITALIN) 10 MG tablet Take 1 tablet (10 mg total) by mouth 4 (four) times daily. 120 tablet 0   methylPREDNISolone (MEDROL) 4 MG tablet 5 tab po qd X 1d then 4 tab po qd X 1d then 3 tab po qd X 1d then 2 tab po qd then 1 tab po qd 15 tablet 1   natalizumab (TYSABRI) 300 MG/15ML injection Inject 300 mg into the vein every 28 (twenty-eight) days.      nitroGLYCERIN (NITROSTAT) 0.4 MG SL tablet PLACE 1 TABLET UNDER THE TOUNGE EVERY 5 MINUTES AS NEEDED FOR CHEST PAIN (X 3 DOSES) 25 tablet 0   nystatin cream (MYCOSTATIN) Apply 1 application topically as directed. 30 g 2   olmesartan (BENICAR)  20 MG tablet TAKE 1 TABLET BY MOUTH EVERY DAY 90 tablet 1   omeprazole (PRILOSEC) 40 MG capsule TAKE 1 CAPSULE BY MOUTH EVERY DAY 90 capsule 1   sucralfate (CARAFATE) 1 g tablet Take 1 tablet (1 g total) by mouth 4 (four) times daily -  with meals and at bedtime. Take tablet or liquid but not both 120 tablet 1   sucralfate (CARAFATE) 1 GM/10ML suspension Take 10 mLs (1 g total) by mouth 4 (four) times daily -  with meals and at bedtime. Take liquid or tablets but not both 420 mL 3   SYNTHROID 150 MCG tablet TAKE 1 TABLET (150 MCG TOTAL) BY MOUTH DAILY BEFORE BREAKFAST. 90 tablet 0   tamsulosin (FLOMAX) 0.4 MG CAPS capsule TAKE 1 CAPSULE BY MOUTH EVERY DAY 90 capsule 3   triamcinolone cream (KENALOG) 0.1 % Apply 1 application topically 2 (two) times daily. 30 g 2   No facility-administered medications prior to visit.     PAST MEDICAL HISTORY: Past Medical History:  Diagnosis Date   Acute bronchitis 05/09/2013   Allergy    Bronchitis, acute 12/10/2011   Candidal skin infection 05/05/2017   Chicken pox as a child   Constipation 02/20/2017   Coronary atherosclerosis of native coronary artery    a. NSTEMI 04/20/11: tx with Promus DES to LAD; bifurcation lesion with oDx 90% tx with POBA; b. staged PCI of right post. AV Branch with Promus DES; c. 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   Visual changes 11/13/2016     PAST SURGICAL HISTORY: Past Surgical History:  Procedure Laterality Date   CORONARY ANGIOPLASTY WITH STENT PLACEMENT     2012   PAIN PUMP IMPLANTATION N/A 08/29/2014   Procedure: Baclofen pump placement;  Surgeon: Maeola Harman, MD;  Location: [facility];  Service: Neurosurgery;  Laterality: N/A;  Baclofen pump placement   PROGRAMABLE BACLOFEN PUMP REVISION  08/29/14   right wrist surgery     CTR   VASCULAR SURGERY     vericose vein in right femoral area   VASECTOMY     WISDOM TOOTH EXTRACTION      FAMILY HISTORY: Family History  Problem Relation Age of Onset   Heart attack Mother 72   Thyroid disease Mother    Heart disease Mother    Thyroid disease Sister    Thyroid disease Brother    Heart disease Maternal Grandmother    Hypertension Maternal Grandmother    Hyperlipidemia Maternal Grandmother    Heart disease Maternal Grandfather    Hypertension Maternal Grandfather    Hyperlipidemia Maternal Grandfather    Stroke Paternal Grandfather    Thyroid disease Sister    Heart disease Father        2 stents   Coronary artery disease Other        questionable in father    SOCIAL HISTORY: Social History   Socioeconomic History   Marital status: Married    Spouse name: Not on file   Number of children: 3   Years of education: Not on file   Highest education level: Not on file  Occupational History   Not on file  Social Needs   Financial resource strain: Not on file   Food insecurity    Worry: Not on file    Inability: Not on file   Transportation needs    Medical: Not on file    Non-medical: Not on file  Tobacco Use   Smoking status: Never Smoker   Smokeless tobacco: Never Used  Substance and Sexual Activity   Alcohol use: Yes    Comment: socially   Drug use: No   Sexual activity: Yes    Partners: Female    Comment: 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, works Engineer, maintenance, no dietary restrictions  Lifestyle   Physical  activity    Days per week: Not on file    Minutes per session: Not on file   Stress: Not on file  Relationships   Social connections    Talks on phone: Not on file    Gets together: Not on file    Attends religious service: Not on file    Active member of club or organization: Not on file    Attends meetings of clubs or organizations: Not on file    Relationship status: Not on file   Intimate partner violence    Fear of current or ex partner: Not on file  Emotionally abused: Not on file    Physically abused: Not on file    Forced sexual activity: Not on file  Other Topics Concern   Not on file  Social History Narrative   Not on file      PHYSICAL EXAM  Vitals:   08/29/19 1247  BP: 116/78  Pulse: 84  Temp: 97.7 F (36.5 C)  Weight: 275 lb (124.7 kg)  Height: 6\' 3"  (1.905 m)   Body mass index is 34.37 kg/m.  Generalized: Well developed, in no acute distress  Cardiology: normal rate and rhythm, no murmur noted Neurological examination  Mentation: Alert oriented to time, place, history taking. Follows all commands speech and language fluent Cranial nerve II-XII: Pupils were equal round reactive to light. Extraocular movements were full, visual field were full on confrontational test. Facial sensation and strength were normal. Uvula tongue midline. Head turning and shoulder shrug  were normal and symmetric. Motor: The motor testing reveals 5 over 5 strength of upper extremities, 2/5 in lower hip flexion bilaterally, 4+ leg extension bilaterally, 4+ left dorsifexion. Good symmetric motor tone is noted throughout.  Sensory: Sensory testing is intact to soft touch on all 4 extremities. No evidence of extinction is noted.  Coordination: Cerebellar testing reveals good finger-nose-finger, not able to perform in lower extremities.  Gait and station: patient able to stand and pivot to wheelchair, not able to walk in room, uses electric wheel chair.  Reflexes: Deep tendon  reflexes decreased in upper and lower extremities  bilaterally.   DIAGNOSTIC DATA (LABS, IMAGING, TESTING) - I reviewed patient records, labs, notes, testing and imaging myself where available.  No flowsheet data found.   Lab Results  Component Value Date   WBC 7.4 07/07/2019   HGB 14.0 07/07/2019   HCT 42.1 07/07/2019   MCV 89.5 07/07/2019   PLT 180.0 07/07/2019      Component Value Date/Time   NA 143 07/07/2019 1140   K 4.3 07/07/2019 1140   CL 105 07/07/2019 1140   CO2 31 07/07/2019 1140   GLUCOSE 92 07/07/2019 1140   BUN 19 07/07/2019 1140   CREATININE 0.85 07/07/2019 1140   CREATININE 0.80 01/05/2015 1544   CALCIUM 9.0 07/07/2019 1140   PROT 6.3 07/07/2019 1140   ALBUMIN 4.2 07/07/2019 1140   AST 15 07/07/2019 1140   ALT 20 07/07/2019 1140   ALKPHOS 67 07/07/2019 1140   BILITOT 0.6 07/07/2019 1140   GFRNONAA >60 12/07/2015 1617   GFRAA >60 12/07/2015 1617   Lab Results  Component Value Date   CHOL 133 07/07/2019   HDL 53.10 07/07/2019   LDLCALC 73 07/07/2019   TRIG 38.0 07/07/2019   CHOLHDL 3 07/07/2019   No results found for: HGBA1C Lab Results  Component Value Date   VITAMINB12 518 07/08/2012   Lab Results  Component Value Date   TSH 0.52 07/07/2019       ASSESSMENT AND PLAN 48 y.o. year old male  has a past medical history of Acute bronchitis (05/09/2013), Allergy, Bronchitis, acute (12/10/2011), Candidal skin infection (05/05/2017), Chicken pox (as a child), Constipation (02/20/2017), Coronary atherosclerosis of native coronary artery, Depression, Depression with anxiety, Dermatitis, contact (12/10/2011), Fatigue (10/15/2012), Heart attack ([place]) (04-20-11), HTN (hypertension), Hyperlipidemia, Hypotestosteronism (11/17/2011), Hypothyroidism, Keloid scar, MS (multiple sclerosis) ([place]), Neuromuscular disorder ([place]), Obesity, Onychomycosis, Preventative health care (10/07/2011), Reflux (10/07/2011), Sun-damaged skin (02/17/2016), Testosterone deficiency (01/16/2012),  Tinea pedis, Varicose veins of leg with pain (11/17/2011), and Visual changes (11/13/2016). here with  ICD-10-CM   1. Multiple sclerosis (HCC)  G35   2. Spasticity  R25.2   3. High risk medication use  Z79.899   4. Gait disturbance  R26.9     Overall dated symptoms are fairly stable at this time.  We will continue current treatment plan as prescribed.  He has noted increased weakness but contributes this to not being able to participate in physical therapy for several months.  Physical therapy resumed last month.  We will continue to work closely with physical therapy.  Activity as tolerated.  He was encouraged to stay as active as possible.  We will continue baclofen pump and tablets as prescribed.  He will call if any concerns arise.  I have advised that he try to space out dosing of medications.  Taking these medications all at 1 time could contribute to brain fog that he feels for about 45 minutes each morning.  He will try spacing these out.  We will continue Tysabri infusions.  He is tolerating medication well.  He will follow-up with Dr. Epimenio Foot in 6 months, sooner if needed.  He verbalizes understanding and agreement with this plan.   No orders of the defined types were placed in this encounter.    No orders of the defined types were placed in this encounter.     I spent 15 minutes with the patient. 50% of this time was spent counseling and educating patient on plan of care and medications.    Shawnie Dapper, FNP-C 08/29/2019, 1:45 PM [facility] [address] [place], [place] [address] [phone]

## 2019-08-30 NOTE — Telephone Encounter (Signed)
[facility] Database Verified LR: 11-12-2018 Qty: 360 Pending appointment: 02-27-2020

## 2019-09-01 ENCOUNTER — Encounter: Payer: Self-pay | Admitting: Pulmonary Disease

## 2019-09-01 ENCOUNTER — Ambulatory Visit (INDEPENDENT_AMBULATORY_CARE_PROVIDER_SITE_OTHER): Payer: Managed Care, Other (non HMO) | Admitting: Pulmonary Disease

## 2019-09-01 ENCOUNTER — Other Ambulatory Visit: Payer: Self-pay

## 2019-09-01 VITALS — BP 110/70 | HR 85 | Temp 97.3°F | Ht 75.0 in | Wt 276.2 lb

## 2019-09-01 DIAGNOSIS — Z23 Encounter for immunization: Secondary | ICD-10-CM

## 2019-09-01 DIAGNOSIS — R0683 Snoring: Secondary | ICD-10-CM | POA: Diagnosis not present

## 2019-09-01 NOTE — Progress Notes (Signed)
[facility]  Chief Complaint  Patient presents with  . Snoring    Patient stated his wife told him it was extensive. Did get an adjustable bed and sleeps on incline.    Constitutional:  BP 110/70 (BP Location: Left Arm, Patient Position: Sitting, Cuff Size: Normal)   Pulse 85   Temp (!) 97.3 F (36.3 C)   Ht 6\' 3"  (1.905 m)   Wt 276 lb 3.2 oz (125.3 kg)   SpO2 94%   BMI 34.52 kg/m   Past Medical History:  Allergies, CAD s/p DES, Depression, Anxiety, HTN, HLD, Hypothyroidism, Low T, Multiple sclerosis  Brief Summary:  Keith Morrow is a 48 y.o. male with snoring.  He is followed by neurology for MS.  He has persistent daytime fatigue.  He does snore, and has been told he stops breathing at time.  He has an adjustable bed, but still snores some.  There was concern he could have sleep apnea and was advised to get further assessment.  He goes to sleep at 10 pm.  He falls asleep in 10 minutes.  He wakes up 1 or 2 times to use the bathroom.  He gets out of bed at 630 am.  He feels okay in the morning after waking up, but then struggles to stay awake later in the morning.  He denies morning headache.  He does not use anything to help him fall sleep.  He has been taking ritalin as part of his MS regimen for the past two years.  He doesn't have any trouble with coughing or swallowing.  No history of pneumonia or recurrent respiratory infections.  He doesn't feel like his breathing limits his activities.  He can walk about 100 feet before having to rest, but this is related to leg muscle fatigue.  He denies sleep walking, sleep talking, bruxism, or nightmares.  There is no history of restless legs.  He denies sleep hallucinations, sleep paralysis, or cataplexy.  The Epworth score is 10 out of 24.     Physical Exam:   Appearance - well kempt   ENMT - clear nasal mucosa, midline nasal  septum, no oral exudates, no LAN, trachea midline   Respiratory - normal chest wall, normal respiratory effort, no accessory muscle use, no wheeze/rales  CV - s1s2 regular rate and rhythm, no murmurs, no peripheral edema, radial pulses symmetric  GI - soft, non tender, no masses  Lymph - no adenopathy noted in neck and axillary areas  MSK - normal gait  Ext - no cyanosis, clubbing, or joint inflammation noted  Skin - no rashes, lesions, or ulcers  Neuro - normal strength, oriented x 3  Psych - normal mood and affect  Discussion:  He has snoring, sleep disruption, apnea, and daytime sleepiness.  He has history of CAD and hypertension. I am concerned he could have obstructive sleep apnea.  Assessment/Plan:   Snoring with excessive daytime sleepiness. - will need to arrange for a home sleep study  Obesity. - discussed how weight can impact sleep and risk for sleep disordered breathing - discussed options to assist with weight loss: combination of diet modification, cardiovascular and strength training exercises  Cardiovascular risk. - had an extensive discussion regarding the adverse health consequences related to untreated sleep disordered breathing - specifically discussed the risks for hypertension, coronary artery disease, cardiac dysrhythmias, cerebrovascular disease, and diabetes - lifestyle modification discussed  Safe driving practices. - discussed how sleep disruption can increase  risk of accidents, particularly when driving - safe driving practices were discussed  Therapies for obstructive sleep apnea. - if the sleep study shows significant sleep apnea, then various therapies for treatment were reviewed: CPAP, oral appliance, and surgical interventions   Patient Instructions  Flu shot today  Will arrange for home sleep study  Will call to arrange for follow up after sleep study reviewed     Chesley Mires, MD [place] Pager: [phone] 09/01/2019, 10:19 AM  Flow Sheet    Sleep  tests:    Review of Systems:  Acid reflux, depression.  Remainder of ROS negative.  Medications:   Allergies as of 09/01/2019   No Known Allergies     Medication List       Accurate as of September 01, 2019 10:19 AM. If you have any questions, ask your nurse or doctor.        aspirin EC 81 MG tablet Take 81 mg by mouth daily.   atorvastatin 40 MG tablet Commonly known as: LIPITOR Take 1 tablet (40 mg total) by mouth daily at 6 PM.   BACLOFEN IT 1 Dose by Intrathecal route as directed.   baclofen 10 MG tablet Commonly known as: LIORESAL Take 1 tablet (10 mg total) by mouth 3 (three) times daily as needed for muscle spasms.   cetirizine 10 MG tablet Commonly known as: ZYRTEC Take 10 mg by mouth daily as needed for allergies.   clobetasol cream 0.05 % Commonly known as: TEMOVATE Apply 1 application topically 2 (two) times daily as needed (skin).   clonazePAM 1 MG tablet Commonly known as: KLONOPIN TAKE 1 TABLET BY MOUTH 4 TIMES A DAY   Clonidine HCl Powd by Does not apply route. 720mg /ml IT Baclofen. Total dose 157.89mg /day. Last pump refill 06/02/16   clotrimazole-betamethasone cream Commonly known as: LOTRISONE Apply 1 application topically 2 (two) times daily as needed (anti-fungal).   cyclobenzaprine 10 MG tablet Commonly known as: FLEXERIL Take 1 tablet (10 mg total) by mouth 3 (three) times daily as needed for muscle spasms.   Cymbalta 60 MG capsule Generic drug: DULoxetine TAKE 1 CAPSULE (60 MG TOTAL) BY MOUTH 2 (TWO) TIMES DAILY.   dalfampridine 10 MG Tb12 Commonly known as: Ampyra Take 1 tablet (10 mg total) by mouth every 12 (twelve) hours.   famotidine 40 MG tablet Commonly known as: PEPCID Take 1 tablet (40 mg total) by mouth at bedtime.   gabapentin 300 MG capsule Commonly known as: NEURONTIN TAKE ONE CAPSULE BY MOUTH IN THE MORNING ONE CAP BY MOUTH IN THE EVENING AND 2 CAPS AT BEDTIME   ibuprofen 200 MG tablet Commonly known as: ADVIL  Take 200 mg by mouth every 6 (six) hours as needed for moderate pain.   methylphenidate 10 MG tablet Commonly known as: RITALIN Take 1 tablet (10 mg total) by mouth 4 (four) times daily.   methylPREDNISolone 4 MG tablet Commonly known as: Medrol 5 tab po qd X 1d then 4 tab po qd X 1d then 3 tab po qd X 1d then 2 tab po qd then 1 tab po qd   nitroGLYCERIN 0.4 MG SL tablet Commonly known as: NITROSTAT PLACE 1 TABLET UNDER THE TOUNGE EVERY 5 MINUTES AS NEEDED FOR CHEST PAIN (X 3 DOSES)   nystatin cream Commonly known as: MYCOSTATIN Apply 1 application topically as directed.   olmesartan 20 MG tablet Commonly known as: BENICAR TAKE 1 TABLET BY MOUTH EVERY DAY   omeprazole 40 MG capsule Commonly known  as: PRILOSEC TAKE 1 CAPSULE BY MOUTH EVERY DAY   sucralfate 1 g tablet Commonly known as: Carafate Take 1 tablet (1 g total) by mouth 4 (four) times daily -  with meals and at bedtime. Take tablet or liquid but not both   sucralfate 1 GM/10ML suspension Commonly known as: Carafate Take 10 mLs (1 g total) by mouth 4 (four) times daily -  with meals and at bedtime. Take liquid or tablets but not both   Synthroid 150 MCG tablet Generic drug: levothyroxine TAKE 1 TABLET (150 MCG TOTAL) BY MOUTH DAILY BEFORE BREAKFAST.   tamsulosin 0.4 MG Caps capsule Commonly known as: FLOMAX TAKE 1 CAPSULE BY MOUTH EVERY DAY   triamcinolone cream 0.1 % Commonly known as: KENALOG Apply 1 application topically 2 (two) times daily.   Tysabri 300 MG/15ML injection Generic drug: natalizumab Inject 300 mg into the vein every 28 (twenty-eight) days.       Past Surgical History:  He  has a past surgical history that includes right wrist surgery; Vasectomy; Vascular surgery; Wisdom tooth extraction; Coronary angioplasty with stent; Pain pump implantation (N/A, 08/29/2014); and Programable baclofen pump revision (08/29/14).  Family History:  His family history includes Coronary artery disease in an  other family member; Heart attack (age of onset: 8465) in his mother; Heart disease in his father, maternal grandfather, maternal grandmother, and mother; Hyperlipidemia in his maternal grandfather and maternal grandmother; Hypertension in his maternal grandfather and maternal grandmother; Stroke in his paternal grandfather; Thyroid disease in his brother, mother, sister, and sister.  Social History:  He  reports that he has never smoked. He has never used smokeless tobacco. He reports current alcohol use. He reports that he does not use drugs.

## 2019-09-01 NOTE — Patient Instructions (Signed)
Flu shot today  Will arrange for home sleep study Will call to arrange for follow up after sleep study reviewed  

## 2019-09-09 ENCOUNTER — Ambulatory Visit (INDEPENDENT_AMBULATORY_CARE_PROVIDER_SITE_OTHER): Payer: 59 | Admitting: Psychology

## 2019-09-09 DIAGNOSIS — F4323 Adjustment disorder with mixed anxiety and depressed mood: Secondary | ICD-10-CM | POA: Diagnosis not present

## 2019-09-12 ENCOUNTER — Other Ambulatory Visit: Payer: Self-pay | Admitting: Family Medicine

## 2019-09-14 ENCOUNTER — Ambulatory Visit: Payer: Managed Care, Other (non HMO)

## 2019-09-14 ENCOUNTER — Other Ambulatory Visit: Payer: Self-pay

## 2019-09-14 DIAGNOSIS — R0683 Snoring: Secondary | ICD-10-CM

## 2019-09-14 DIAGNOSIS — G4733 Obstructive sleep apnea (adult) (pediatric): Secondary | ICD-10-CM

## 2019-09-22 ENCOUNTER — Telehealth: Payer: Self-pay | Admitting: *Deleted

## 2019-09-22 DIAGNOSIS — G4733 Obstructive sleep apnea (adult) (pediatric): Secondary | ICD-10-CM

## 2019-09-22 NOTE — Telephone Encounter (Signed)
He is noting more dysesthesias in the trunk/flank.   Right is always present but worse and left dysesthesia now more noticeable  Pain was worse when a dose of gabapentin was skipped.  We will increase gabapentin to 300-300-600 mg on a regular basis and he can go up to 600 mg po tid if tolerated.

## 2019-09-22 NOTE — Telephone Encounter (Signed)
Lovena Le, RN from infusion suite reported to me that pt reports he feels his MS hug sx have worsened. He is here for Tysabri infusion today. Joylene John, RN that I would report to MD and see if he would like to speak with pt while he is here.

## 2019-09-23 ENCOUNTER — Ambulatory Visit: Payer: 59 | Admitting: Psychology

## 2019-09-23 ENCOUNTER — Telehealth: Payer: Self-pay | Admitting: Pulmonary Disease

## 2019-09-23 NOTE — Telephone Encounter (Signed)
HST 09/14/19 >> AHI 28.2, SpO2 low 78%.   Please inform him that his sleep study shows moderate obstructive sleep apnea.  Please arrange for ROV with me or NP to discuss treatment options.

## 2019-09-23 NOTE — Telephone Encounter (Signed)
LVMTCB x 1 for patient. 

## 2019-09-27 NOTE — Telephone Encounter (Signed)
Called patient back, confirmed he received the vmail but did not get a chance to call back. Patient scheduled to see Dr. Halford Chessman on 10/03/19. Nothing further needed.

## 2019-10-01 ENCOUNTER — Other Ambulatory Visit: Payer: Self-pay | Admitting: Family Medicine

## 2019-10-03 ENCOUNTER — Encounter: Payer: Self-pay | Admitting: Pulmonary Disease

## 2019-10-03 ENCOUNTER — Other Ambulatory Visit: Payer: Self-pay

## 2019-10-03 ENCOUNTER — Ambulatory Visit (INDEPENDENT_AMBULATORY_CARE_PROVIDER_SITE_OTHER): Payer: Managed Care, Other (non HMO) | Admitting: Pulmonary Disease

## 2019-10-03 VITALS — BP 132/84 | HR 104 | Temp 97.2°F | Ht 76.0 in | Wt 275.0 lb

## 2019-10-03 DIAGNOSIS — G4733 Obstructive sleep apnea (adult) (pediatric): Secondary | ICD-10-CM

## 2019-10-03 DIAGNOSIS — Z7189 Other specified counseling: Secondary | ICD-10-CM

## 2019-10-03 NOTE — Patient Instructions (Signed)
Will arrange for auto CPAP set up  Follow up in 2 months 

## 2019-10-03 NOTE — Progress Notes (Signed)
[facility]  Chief Complaint  Patient presents with  . Results    Discuss sleep study results.    Constitutional:  BP 132/84 (BP Location: Left Arm, Patient Position: Sitting, Cuff Size: Normal)   Pulse (!) 104   Temp (!) 97.2 F (36.2 C)   Ht 6\' 4"  (1.93 m)   Wt 275 lb (124.7 kg)   SpO2 98% Comment: on room air  BMI 33.47 kg/m   Past Medical History:  Allergies, CAD s/p DES, Depression, Anxiety, HTN, HLD, Hypothyroidism, Low T, Multiple sclerosis  Brief Summary:  ASHTAN GIRTMAN is a 48 y.o. male with obstructive sleep apnea.  He had home sleep study.  Showed moderate obstructive sleep apnea.   Physical Exam:   Appearance - well kempt   ENMT - no sinus tenderness, no nasal discharge, no oral exudate  Neck - no masses, trachea midline, no thyromegaly, no elevation in JVP  Respiratory - normal appearance of chest wall, normal respiratory effort w/o accessory muscle use, no dullness on percussion, no wheezing or rales  CV - s1s2 regular rate and rhythm, no murmurs, no peripheral edema, radial pulses symmetric  GI - soft, non tender  Lymph - no adenopathy noted in neck and axillary areas  MSK - sitting in wheelchair  Ext - no cyanosis, clubbing, or joint inflammation noted  Skin - no rashes, lesions, or ulcers  Neuro - oriented x 3  Psych - normal mood and affect   Assessment/Plan:   Obstructive sleep apnea. - sleep study results reviewed with him - treatment options discussed - will arrange for auto CPAP set up   Patient Instructions  Will arrange for auto CPAP set up  Follow up in 2 months   A total of  18 minutes were spent face to face with the patient and more than half of that time involved counseling or coordination of care.   Chesley Mires, MD [facility] Pager: [phone] 10/03/2019, 12:33 PM  Flow Sheet    Sleep tests:  HST 09/14/19 >> AHI 28.2, SpO2 low 78%.   Medications:   Allergies as of 10/03/2019   No Known Allergies     Medication List       Accurate as of October 03, 2019 12:33 PM. If you have any questions, ask your nurse or doctor.        aspirin EC 81 MG tablet Take 81 mg by mouth daily.   atorvastatin 40 MG tablet Commonly known as: LIPITOR Take 1 tablet (40 mg total) by mouth daily at 6 PM.   BACLOFEN IT 1 Dose by Intrathecal route as directed.   baclofen 10 MG tablet Commonly known as: LIORESAL Take 1 tablet (10 mg total) by mouth 3 (three) times daily as needed for muscle spasms.   cetirizine 10 MG tablet Commonly known as: ZYRTEC Take 10 mg by mouth daily as needed for allergies.   clobetasol cream 0.05 % Commonly known as: TEMOVATE Apply 1 application topically 2 (two) times daily as needed (skin).   clonazePAM 1 MG tablet Commonly known as: KLONOPIN TAKE 1 TABLET BY MOUTH 4 TIMES A DAY   Clonidine HCl Powd by Does not apply route. 720mg /ml IT Baclofen. Total dose 157.89mg /day. Last pump refill 06/02/16   clotrimazole-betamethasone cream Commonly known as: LOTRISONE Apply 1 application topically 2 (two) times daily as needed (anti-fungal).   cyclobenzaprine 10 MG tablet Commonly known as: FLEXERIL Take 1 tablet (10 mg total) by mouth 3 (  three) times daily as needed for muscle spasms.   Cymbalta 60 MG capsule Generic drug: DULoxetine TAKE 1 CAPSULE (60 MG TOTAL) BY MOUTH 2 (TWO) TIMES DAILY.   dalfampridine 10 MG Tb12 Commonly known as: Ampyra Take 1 tablet (10 mg total) by mouth every 12 (twelve) hours.   famotidine 40 MG tablet Commonly known as: PEPCID Take 1 tablet (40 mg total) by mouth at bedtime.   gabapentin 300 MG capsule Commonly known as: NEURONTIN TAKE ONE CAPSULE BY MOUTH IN THE MORNING ONE CAP BY MOUTH IN THE EVENING AND 2 CAPS AT BEDTIME   ibuprofen 200 MG tablet Commonly known as: ADVIL Take 200 mg by mouth every 6 (six) hours as needed for moderate pain.   methylphenidate  10 MG tablet Commonly known as: RITALIN Take 1 tablet (10 mg total) by mouth 4 (four) times daily.   methylPREDNISolone 4 MG tablet Commonly known as: Medrol 5 tab po qd X 1d then 4 tab po qd X 1d then 3 tab po qd X 1d then 2 tab po qd then 1 tab po qd   nitroGLYCERIN 0.4 MG SL tablet Commonly known as: NITROSTAT PLACE 1 TABLET UNDER THE TOUNGE EVERY 5 MINUTES AS NEEDED FOR CHEST PAIN (X 3 DOSES)   nystatin cream Commonly known as: MYCOSTATIN Apply 1 application topically as directed.   olmesartan 20 MG tablet Commonly known as: BENICAR TAKE 1 TABLET BY MOUTH EVERY DAY   omeprazole 40 MG capsule Commonly known as: PRILOSEC TAKE 1 CAPSULE BY MOUTH EVERY DAY   sucralfate 1 g tablet Commonly known as: Carafate Take 1 tablet (1 g total) by mouth 4 (four) times daily -  with meals and at bedtime. Take tablet or liquid but not both   sucralfate 1 GM/10ML suspension Commonly known as: Carafate Take 10 mLs (1 g total) by mouth 4 (four) times daily -  with meals and at bedtime. Take liquid or tablets but not both   Synthroid 150 MCG tablet Generic drug: levothyroxine TAKE 1 TABLET (150 MCG TOTAL) BY MOUTH DAILY BEFORE BREAKFAST.   tamsulosin 0.4 MG Caps capsule Commonly known as: FLOMAX TAKE 1 CAPSULE BY MOUTH EVERY DAY   triamcinolone cream 0.1 % Commonly known as: KENALOG Apply 1 application topically 2 (two) times daily.   Tysabri 300 MG/15ML injection Generic drug: natalizumab Inject 300 mg into the vein every 28 (twenty-eight) days.       Past Surgical History:  He  has a past surgical history that includes right wrist surgery; Vasectomy; Vascular surgery; Wisdom tooth extraction; Coronary angioplasty with stent; Pain pump implantation (N/A, 08/29/2014); and Programable baclofen pump revision (08/29/14).  Family History:  His family history includes Coronary artery disease in an other family member; Heart attack (age of onset: 43) in his mother; Heart disease in his  father, maternal grandfather, maternal grandmother, and mother; Hyperlipidemia in his maternal grandfather and maternal grandmother; Hypertension in his maternal grandfather and maternal grandmother; Stroke in his paternal grandfather; Thyroid disease in his brother, mother, sister, and sister.  Social History:  He  reports that he has never smoked. He has never used smokeless tobacco. He reports current alcohol use. He reports that he does not use drugs.

## 2019-10-05 ENCOUNTER — Other Ambulatory Visit: Payer: Self-pay

## 2019-10-06 ENCOUNTER — Ambulatory Visit (INDEPENDENT_AMBULATORY_CARE_PROVIDER_SITE_OTHER): Payer: Managed Care, Other (non HMO) | Admitting: Family Medicine

## 2019-10-06 ENCOUNTER — Encounter: Payer: Self-pay | Admitting: Family Medicine

## 2019-10-06 ENCOUNTER — Other Ambulatory Visit: Payer: Self-pay

## 2019-10-06 VITALS — BP 114/66 | HR 80 | Temp 97.9°F | Resp 18 | Wt 275.0 lb

## 2019-10-06 DIAGNOSIS — G35 Multiple sclerosis: Secondary | ICD-10-CM | POA: Diagnosis not present

## 2019-10-06 DIAGNOSIS — F418 Other specified anxiety disorders: Secondary | ICD-10-CM

## 2019-10-06 DIAGNOSIS — R0789 Other chest pain: Secondary | ICD-10-CM | POA: Diagnosis not present

## 2019-10-06 DIAGNOSIS — I10 Essential (primary) hypertension: Secondary | ICD-10-CM | POA: Diagnosis not present

## 2019-10-06 DIAGNOSIS — E039 Hypothyroidism, unspecified: Secondary | ICD-10-CM

## 2019-10-06 LAB — CBC
HCT: 42.1 % (ref 39.0–52.0)
Hemoglobin: 13.8 g/dL (ref 13.0–17.0)
MCHC: 32.8 g/dL (ref 30.0–36.0)
MCV: 90.4 fl (ref 78.0–100.0)
Platelets: 192 10*3/uL (ref 150.0–400.0)
RBC: 4.65 Mil/uL (ref 4.22–5.81)
RDW: 14.9 % (ref 11.5–15.5)
WBC: 7.5 10*3/uL (ref 4.0–10.5)

## 2019-10-06 LAB — COMPREHENSIVE METABOLIC PANEL
ALT: 26 U/L (ref 0–53)
AST: 18 U/L (ref 0–37)
Albumin: 4.1 g/dL (ref 3.5–5.2)
Alkaline Phosphatase: 68 U/L (ref 39–117)
BUN: 12 mg/dL (ref 6–23)
CO2: 33 mEq/L — ABNORMAL HIGH (ref 19–32)
Calcium: 9.1 mg/dL (ref 8.4–10.5)
Chloride: 104 mEq/L (ref 96–112)
Creatinine, Ser: 0.79 mg/dL (ref 0.40–1.50)
GFR: 105.31 mL/min (ref >60.00)
Glucose, Bld: 94 mg/dL (ref 70–99)
Potassium: 4.6 mEq/L (ref 3.5–5.1)
Sodium: 142 mEq/L (ref 135–145)
Total Bilirubin: 0.7 mg/dL (ref 0.2–1.2)
Total Protein: 6.2 g/dL (ref 6.0–8.3)

## 2019-10-06 NOTE — Assessment & Plan Note (Signed)
Tolerating statin, encouraged heart healthy diet, avoid trans fats, minimize simple carbs and saturated fats. Increase exercise as tolerated 

## 2019-10-06 NOTE — Assessment & Plan Note (Signed)
Well controlled, no changes to meds. Encouraged heart healthy diet such as the DASH diet and exercise as tolerated.  °

## 2019-10-06 NOTE — Patient Instructions (Signed)

## 2019-10-07 ENCOUNTER — Ambulatory Visit (INDEPENDENT_AMBULATORY_CARE_PROVIDER_SITE_OTHER): Payer: 59 | Admitting: Psychology

## 2019-10-07 DIAGNOSIS — F4323 Adjustment disorder with mixed anxiety and depressed mood: Secondary | ICD-10-CM | POA: Diagnosis not present

## 2019-10-07 LAB — TROPONIN I: Troponin I: <0.01 ng/mL (ref <=0.0)

## 2019-10-09 ENCOUNTER — Other Ambulatory Visit: Payer: Self-pay | Admitting: Neurology

## 2019-10-09 NOTE — Assessment & Plan Note (Signed)
He continues to work with a Social worker and is managing on current medications.

## 2019-10-09 NOTE — Assessment & Plan Note (Signed)
He is largely confined to his wheelchair now and has been working from home during the pandemic.

## 2019-10-09 NOTE — Assessment & Plan Note (Signed)
On Levothyroxine, continue to monitor 

## 2019-10-09 NOTE — Progress Notes (Signed)
Subjective:    Patient ID: Keith Morrow, male    DOB: 1971-08-21, 48 y.o.   MRN: [id number]  No chief complaint on file.   HPI Patient is in today for follow up on chronic medical concerns including multiple sclerosis, hyperlipidemia, hypothyroidism and more. He is feeling well today but is struggling with anhedonia and isolation. He is largely wheelchair bound and working from home. He notes anhedonia and continues to work with a Social worker. Denies palp/SOB/HA/congestion/fevers/GI or GU c/o. Taking meds as prescribed. He does note some chest wall pain when sitting for prolonged periods of time. No associated symptoms.   Past Medical History:  Diagnosis Date  . Acute bronchitis 05/09/2013  . Allergy   . Bronchitis, acute 12/10/2011  . Candidal skin infection 05/05/2017  . Chicken pox as a child  . Constipation 02/20/2017  . Coronary atherosclerosis of native coronary artery    a. NSTEMI 04/20/11: tx with Promus DES to LAD; bifurcation lesion with oDx 90% tx with POBA; b. staged PCI of right post. AV Branch with Promus DES; c. residual at cath 04/21/11:  AV groove CFX 50%, mRCA 30%,; EF 55%  . Depression   . Depression with anxiety   . Dermatitis, contact 12/10/2011  . Fatigue 10/15/2012  . Heart attack ([place]) 04-20-11  . HTN (hypertension)   . Hyperlipidemia   . Hypotestosteronism 11/17/2011  . Hypothyroidism   . Keloid scar   . MS (multiple sclerosis) ([place])   . Neuromuscular disorder (HCC)    NUMBNESS/TINGLING  . Obesity   . Onychomycosis   . Preventative health care 10/07/2011  . Reflux 10/07/2011  . Sun-damaged skin 02/17/2016  . Testosterone deficiency 01/16/2012  . Tinea pedis   . 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 Visual changes 11/13/2016    Past Surgical History:  Procedure Laterality Date  . CORONARY ANGIOPLASTY WITH STENT PLACEMENT     2012  . PAIN PUMP IMPLANTATION N/A 08/29/2014   Procedure: Baclofen pump placement;  Surgeon: Erline Levine, MD;  Location: [facility];  Service: Neurosurgery;  Laterality: N/A;  Baclofen pump placement  . PROGRAMABLE BACLOFEN PUMP REVISION  08/29/14  . right wrist surgery     CTR  . VASCULAR SURGERY     vericose vein in right femoral area  . VASECTOMY    . WISDOM TOOTH EXTRACTION      Family History  Problem Relation Age of Onset  . Heart attack Mother 63  . Thyroid disease Mother   . Heart disease Mother   . Thyroid disease Sister   . Thyroid disease Brother   . Heart disease Maternal Grandmother   . Hypertension Maternal Grandmother   . Hyperlipidemia Maternal Grandmother   . Heart disease Maternal Grandfather   . Hypertension Maternal Grandfather   . Hyperlipidemia Maternal Grandfather   . Stroke Paternal Grandfather   . Thyroid disease Sister   . Heart disease Father        2 stents  . Coronary artery disease Other        questionable in father    Social History   Socioeconomic History  . Marital status: Married    Spouse name: Not on file  . Number of children: 3  . Years of education: Not on file  . Highest education level: Not on file  Occupational History  . Not on file  Social Needs  . Financial resource strain: Not on file  . Food insecurity    Worry:  Not on file    Inability: Not on file  . Transportation needs    Medical: Not on file    Non-medical: Not on file  Tobacco Use  . Smoking status: Never Smoker  . Smokeless tobacco: Never Used  Substance and Sexual Activity  . Alcohol use: Yes    Comment: socially  . Drug use: No  . Sexual activity: Yes    Partners: Female    Comment: 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, works [employer], no dietary restrictions  Lifestyle  . Physical activity    Days per week: Not on file    Minutes per session: Not on file  . Stress: Not on file  Relationships  . Social Musicianconnections    Talks on phone: Not on file    Gets together: Not on file    Attends religious service: Not on file    Active member of club or organization: Not  on file    Attends meetings of clubs or organizations: Not on file    Relationship status: Not on file  . Intimate partner violence    Fear of current or ex partner: Not on file    Emotionally abused: Not on file    Physically abused: Not on file    Forced sexual activity: Not on file  Other Topics Concern  . Not on file  Social History Narrative  . Not on file    Outpatient Medications Prior to Visit  Medication Sig Dispense Refill  . aspirin EC 81 MG tablet Take 81 mg by mouth daily.    Marland Kitchen. atorvastatin (LIPITOR) 40 MG tablet Take 1 tablet (40 mg total) by mouth daily at 6 PM. 90 tablet 3  . baclofen (LIORESAL) 10 MG tablet Take 1 tablet (10 mg total) by mouth 3 (three) times daily as needed for muscle spasms. 90 each 5  . BACLOFEN IT 1 Dose by Intrathecal route as directed.     . cetirizine (ZYRTEC) 10 MG tablet Take 10 mg by mouth daily as needed for allergies.     . clobetasol cream (TEMOVATE) 0.05 % Apply 1 application topically 2 (two) times daily as needed (skin).    . clonazePAM (KLONOPIN) 1 MG tablet TAKE 1 TABLET BY MOUTH 4 TIMES A DAY 360 tablet 1  . Clonidine HCl POWD by Does not apply route. 720mg /ml IT Baclofen. Total dose 157.89mg /day. Last pump refill 06/02/16    . clotrimazole-betamethasone (LOTRISONE) cream Apply 1 application topically 2 (two) times daily as needed (anti-fungal). 30 g 2  . cyclobenzaprine (FLEXERIL) 10 MG tablet Take 1 tablet (10 mg total) by mouth 3 (three) times daily as needed for muscle spasms. 90 tablet 11  . CYMBALTA 60 MG capsule TAKE 1 CAPSULE (60 MG TOTAL) BY MOUTH 2 (TWO) TIMES DAILY. 180 capsule 1  . dalfampridine (AMPYRA) 10 MG TB12 Take 1 tablet (10 mg total) by mouth every 12 (twelve) hours. 180 tablet 3  . famotidine (PEPCID) 40 MG tablet Take 1 tablet (40 mg total) by mouth at bedtime. 90 tablet 3  . gabapentin (NEURONTIN) 300 MG capsule TAKE ONE CAPSULE BY MOUTH IN THE MORNING ONE CAP BY MOUTH IN THE EVENING AND 2 CAPS AT BEDTIME 360  capsule 1  . ibuprofen (ADVIL,MOTRIN) 200 MG tablet Take 200 mg by mouth every 6 (six) hours as needed for moderate pain.    . methylphenidate (RITALIN) 10 MG tablet Take 1 tablet (10 mg total) by mouth 4 (four) times daily. 120 tablet 0  .  methylPREDNISolone (MEDROL) 4 MG tablet 5 tab po qd X 1d then 4 tab po qd X 1d then 3 tab po qd X 1d then 2 tab po qd then 1 tab po qd 15 tablet 1  . natalizumab (TYSABRI) 300 MG/15ML injection Inject 300 mg into the vein every 28 (twenty-eight) days.     . nitroGLYCERIN (NITROSTAT) 0.4 MG SL tablet PLACE 1 TABLET UNDER THE TOUNGE EVERY 5 MINUTES AS NEEDED FOR CHEST PAIN (X 3 DOSES) 25 tablet 0  . nystatin cream (MYCOSTATIN) Apply 1 application topically as directed. 30 g 2  . olmesartan (BENICAR) 20 MG tablet TAKE 1 TABLET BY MOUTH EVERY DAY 90 tablet 1  . omeprazole (PRILOSEC) 40 MG capsule TAKE 1 CAPSULE BY MOUTH EVERY DAY 90 capsule 1  . sucralfate (CARAFATE) 1 g tablet Take 1 tablet (1 g total) by mouth 4 (four) times daily -  with meals and at bedtime. Take tablet or liquid but not both 120 tablet 1  . sucralfate (CARAFATE) 1 GM/10ML suspension Take 10 mLs (1 g total) by mouth 4 (four) times daily -  with meals and at bedtime. Take liquid or tablets but not both 420 mL 3  . SYNTHROID 150 MCG tablet TAKE 1 TABLET (150 MCG TOTAL) BY MOUTH DAILY BEFORE BREAKFAST. 90 tablet 0  . tamsulosin (FLOMAX) 0.4 MG CAPS capsule TAKE 1 CAPSULE BY MOUTH EVERY DAY 90 capsule 3  . triamcinolone cream (KENALOG) 0.1 % Apply 1 application topically 2 (two) times daily. 30 g 2   No facility-administered medications prior to visit.     No Known Allergies  Review of Systems  Constitutional: Positive for malaise/fatigue. Negative for fever.  HENT: Negative for congestion.   Eyes: Negative for blurred vision.  Respiratory: Negative for shortness of breath.   Cardiovascular: Negative for chest pain, palpitations and leg swelling.  Gastrointestinal: Negative for abdominal  pain, blood in stool and nausea.  Genitourinary: Negative for dysuria and frequency.  Musculoskeletal: Negative for falls.  Skin: Negative for rash.  Neurological: Positive for weakness. Negative for dizziness, loss of consciousness and headaches.  Endo/Heme/Allergies: Negative for environmental allergies.  Psychiatric/Behavioral: Positive for depression. The patient is not nervous/anxious.        Objective:    Physical Exam Vitals signs and nursing note reviewed.  Constitutional:      General: He is not in acute distress.    Appearance: He is well-developed.  HENT:     Head: Normocephalic and atraumatic.     Nose: Nose normal.  Eyes:     General:        Right eye: No discharge.        Left eye: No discharge.  Neck:     Musculoskeletal: Normal range of motion and neck supple.  Cardiovascular:     Rate and Rhythm: Normal rate and regular rhythm.     Heart sounds: No murmur.  Pulmonary:     Effort: Pulmonary effort is normal.     Breath sounds: Normal breath sounds.  Abdominal:     General: Bowel sounds are normal.     Palpations: Abdomen is soft.     Tenderness: There is no abdominal tenderness.  Musculoskeletal:     Comments: In wheelchair  Skin:    General: Skin is warm and dry.  Neurological:     Mental Status: He is alert and oriented to person, place, and time.     BP 114/66   Pulse 80   Temp 97.9  F (36.6 C) (Temporal)   Resp 18   Wt 275 lb (124.7 kg)   SpO2 100%   BMI 33.47 kg/m  Wt Readings from Last 3 Encounters:  10/06/19 275 lb (124.7 kg)  10/03/19 275 lb (124.7 kg)  09/01/19 276 lb 3.2 oz (125.3 kg)    Diabetic Foot Exam - Simple   No data filed     Lab Results  Component Value Date   WBC 7.5 10/06/2019   HGB 13.8 10/06/2019   HCT 42.1 10/06/2019   PLT 192.0 10/06/2019   GLUCOSE 94 10/06/2019   CHOL 133 07/07/2019   TRIG 38.0 07/07/2019   HDL 53.10 07/07/2019   LDLCALC 73 07/07/2019   ALT 26 10/06/2019   AST 18 10/06/2019   NA  142 10/06/2019   K 4.6 10/06/2019   CL 104 10/06/2019   CREATININE 0.79 10/06/2019   BUN 12 10/06/2019   CO2 33 (H) 10/06/2019   TSH 0.52 07/07/2019   PSA 0.52 02/11/2016   INR 1.00 04/21/2011    Lab Results  Component Value Date   TSH 0.52 07/07/2019   Lab Results  Component Value Date   WBC 7.5 10/06/2019   HGB 13.8 10/06/2019   HCT 42.1 10/06/2019   MCV 90.4 10/06/2019   PLT 192.0 10/06/2019   Lab Results  Component Value Date   NA 142 10/06/2019   K 4.6 10/06/2019   CO2 33 (H) 10/06/2019   GLUCOSE 94 10/06/2019   BUN 12 10/06/2019   CREATININE 0.79 10/06/2019   BILITOT 0.7 10/06/2019   ALKPHOS 68 10/06/2019   AST 18 10/06/2019   ALT 26 10/06/2019   PROT 6.2 10/06/2019   ALBUMIN 4.1 10/06/2019   CALCIUM 9.1 10/06/2019   ANIONGAP 10 12/07/2015   GFR 105.31 10/06/2019   Lab Results  Component Value Date   CHOL 133 07/07/2019   Lab Results  Component Value Date   HDL 53.10 07/07/2019   Lab Results  Component Value Date   LDLCALC 73 07/07/2019   Lab Results  Component Value Date   TRIG 38.0 07/07/2019   Lab Results  Component Value Date   CHOLHDL 3 07/07/2019   No results found for: HGBA1C     Assessment & Plan:   Problem List Items Addressed This Visit    Multiple sclerosis (HCC)    He is largely confined to his wheelchair now and has been working from home during the pandemic.      Hypothyroidism    On Levothyroxine, continue to monitor      HTN (hypertension) - Primary    Well controlled, no changes to meds. Encouraged heart healthy diet such as the DASH diet and exercise as tolerated.       Relevant Orders   CBC (Completed)   Comprehensive metabolic panel (Completed)   Depression with anxiety    He continues to work with a Veterinary surgeon and is managing on current medications.        Other Visit Diagnoses    Atypical chest pain       Relevant Orders   Troponin I (Completed)      I am having [name] maintain his  cetirizine, aspirin EC, ibuprofen, BACLOFEN IT, natalizumab, Clonidine HCl, clotrimazole-betamethasone, nitroGLYCERIN, clobetasol cream, nystatin cream, cyclobenzaprine, sucralfate, dalfampridine, baclofen, atorvastatin, tamsulosin, gabapentin, omeprazole, Cymbalta, methylphenidate, sucralfate, methylPREDNISolone, famotidine, triamcinolone cream, clonazePAM, Synthroid, and olmesartan.  No orders of the defined types were placed in this encounter.    [name], MD

## 2019-10-10 ENCOUNTER — Other Ambulatory Visit: Payer: Self-pay | Admitting: *Deleted

## 2019-10-10 MED ORDER — METHYLPHENIDATE HCL 10 MG PO TABS: 10.0000 mg | ORAL_TABLET | Freq: Four times a day (QID) | ORAL | 0 refills | Status: DC

## 2019-10-19 ENCOUNTER — Telehealth: Payer: Self-pay | Admitting: *Deleted

## 2019-10-19 NOTE — Telephone Encounter (Signed)
Received letter in the mail from [facility] that baclofen pump refill approved for [id number] Home infusion therapy with an implanted pump and supplies. Authorized from 10/09/2019-01/08/2020. Authorization: [id number]. Customer ID: [id number].Sent copy to be scanned into epic.

## 2019-10-21 ENCOUNTER — Ambulatory Visit (INDEPENDENT_AMBULATORY_CARE_PROVIDER_SITE_OTHER): Payer: 59 | Admitting: Psychology

## 2019-10-21 DIAGNOSIS — F4323 Adjustment disorder with mixed anxiety and depressed mood: Secondary | ICD-10-CM | POA: Diagnosis not present

## 2019-10-29 ENCOUNTER — Other Ambulatory Visit: Payer: Self-pay

## 2019-10-29 DIAGNOSIS — Z20822 Contact with and (suspected) exposure to covid-19: Secondary | ICD-10-CM

## 2019-10-31 LAB — NOVEL CORONAVIRUS, NAA: SARS-CoV-2, NAA: NOT DETECTED

## 2019-10-31 LAB — SPECIMEN STATUS REPORT

## 2019-11-03 IMAGING — MR MR HEAD WO/W CM
22 of 25 series · 34 of 48 positions shown · IV contrast (gadavist)
Comparison: 12/19/2015 brain MRI.  Cervical MRI 11/29/2015

CLINICAL DATA: Multiple sclerosis with new neurologic event

EXAM:
MRI HEAD WITHOUT AND WITH CONTRAST
MRI CERVICAL SPINE WITHOUT AND WITH CONTRAST
TECHNIQUE: Multiplanar, multiecho pulse sequences of the brain and surrounding
structures, and cervical spine, to include the craniocervical
junction and cervicothoracic junction, were obtained without and
with intravenous contrast.
CONTRAST:  10 cc Gadavist intravenous

[Series 5: ax dwi_tracew · axial · 3.0mm · 1.50mm/px · z∈[-35,+115]mm · 4 of 104 slices shown]
[im 1/104]
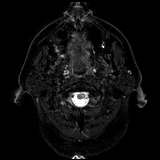
[im 35/104]
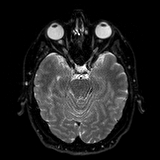
[im 69/104]
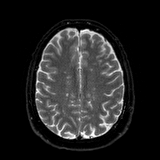
[im 104/104]
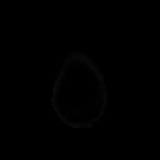

[Series 6: ax dwi_adc · axial · 3.0mm · 1.50mm/px · z∈[-35,+115]mm · 2 of 52 slices shown]
[im 1/52]
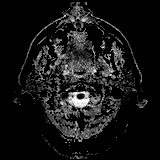
[im 52/52]
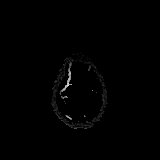

[Series 7: cor dwi_tracew · coronal · 5.0mm · 1.44mm/px · 2 of 68 slices shown]
[im 1/68]
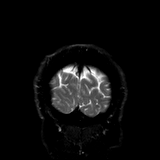
[im 68/68]
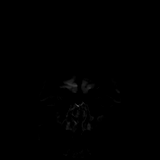

[Series 8: cor dwi_adc · coronal · 5.0mm · 1.44mm/px · 1 of 34 slices shown]
[im 1/34]
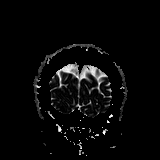

[Series 9: T1 · sagittal · 5.0mm · 0.75mm/px · 1 of 25 slices shown (1 of 3)]
[im 1/25]
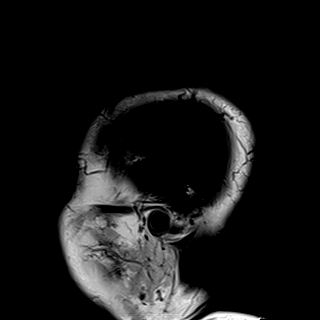

[Series 10: T2 · axial · 5.0mm · 0.72mm/px · 1 of 28 slices shown (1 of 3)]
[im 1/28]
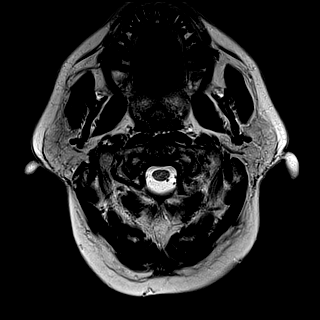

[Series 11: FLAIR · axial · 5.0mm · 0.45mm/px · 1 of 28 slices shown]
[im 1/28]
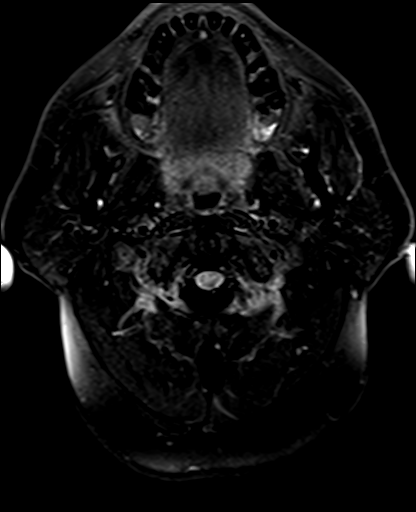

[Series 12: swi_images · axial · 3.0mm · 0.90mm/px · z∈[-54,+122]mm · 2 of 60 slices shown]
[im 1/60]
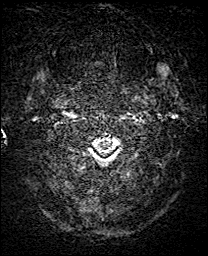
[im 60/60]
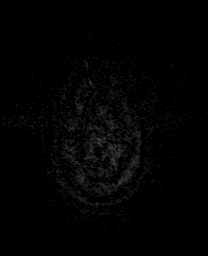

[Series 13: mip_images(sw) · axial · 24.0mm · 0.90mm/px · z∈[-44,+111]mm · 2 of 53 slices shown]
[im 1/53]
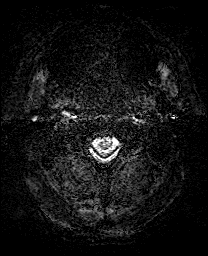
[im 53/53]
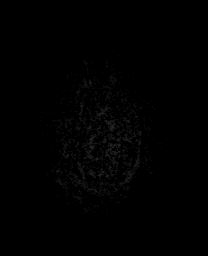

[Series 14: t2_space_dark-fluid_sag_p2_ns-ir · sagittal · 1.0mm · 0.49mm/px · 5 of 144 slices shown]
[im 1/144]
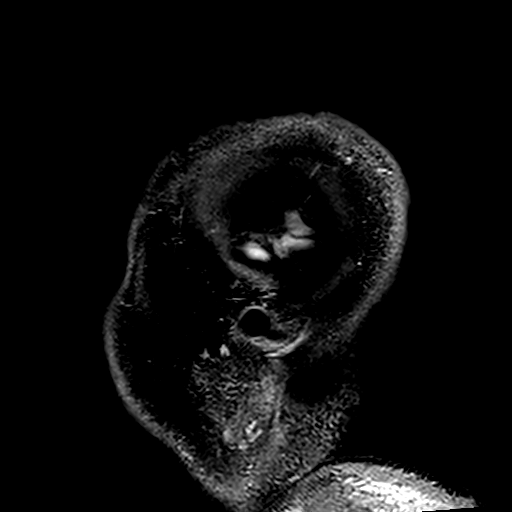
[im 36/144]
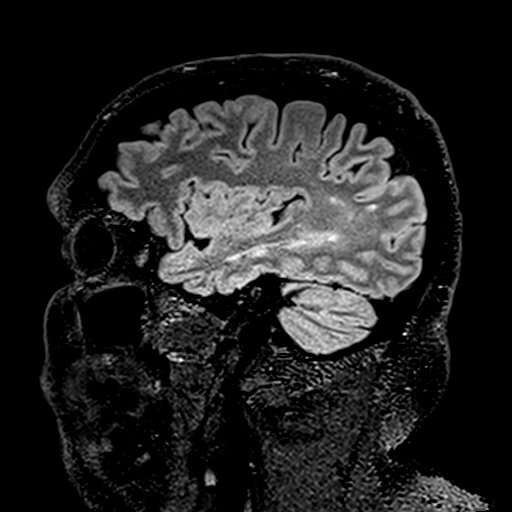
[im 72/144]
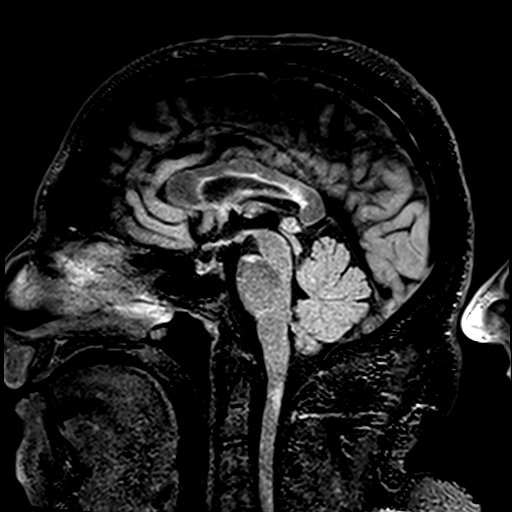
[im 108/144]
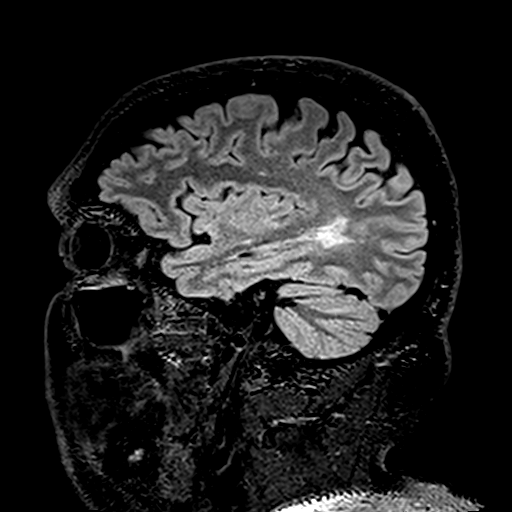
[im 144/144]
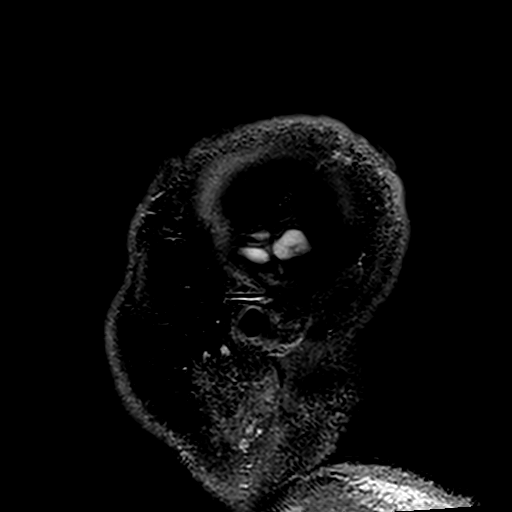

[Series 15: t2_space_dark-fluid_sag_p2_ns-ir_mpr_ axial · axial · 1.0mm · 0.45mm/px · z∈[-27,+5]mm · 2 of 130 slices shown]
[im 1/130]
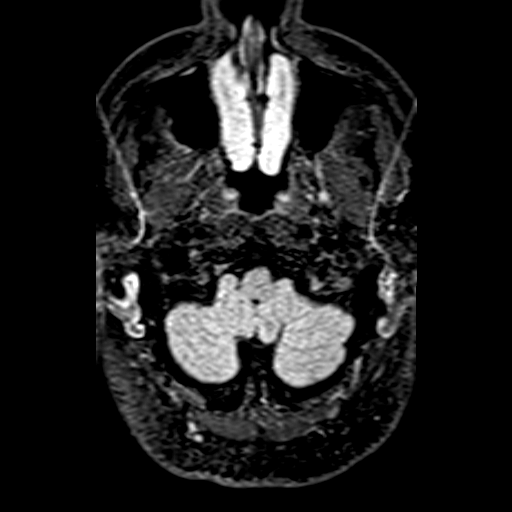
[im 33/130]
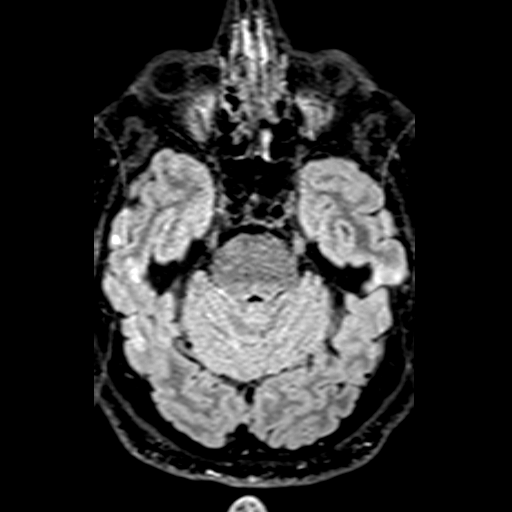

[Series 22: T2 · sagittal · 3.0mm · 0.69mm/px · 1 of 15 slices shown (2 of 3)]
[im 1/15]
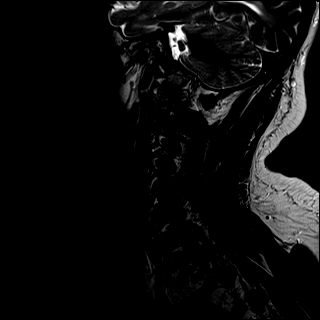

[Series 23: T1 · sagittal · 3.0mm · 0.69mm/px · 1 of 15 slices shown (2 of 3)]
[im 1/15]
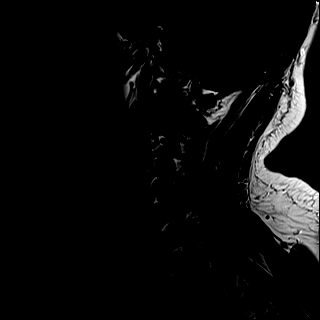

[Series 24: STIR · sagittal · 3.0mm · 0.86mm/px · 1 of 15 slices shown]
[im 1/15]
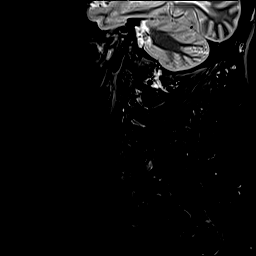

[Series 25: T2 · axial · 3.0mm · 0.66mm/px · 1 of 40 slices shown (3 of 3)]
[im 1/40]
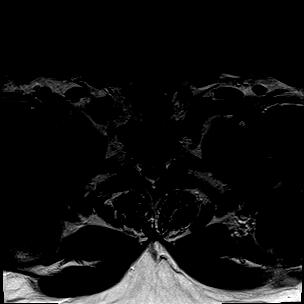

[Series 26: GRE · axial · 3.0mm · 0.39mm/px · 1 of 40 slices shown]
[im 1/40]
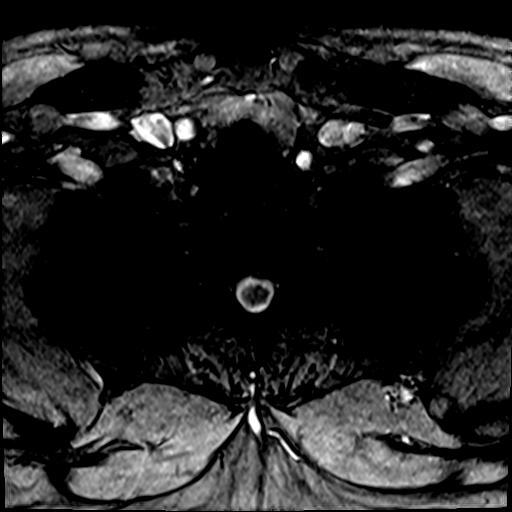

[Series 27: T1 · axial · 3.0mm · 0.39mm/px · 1 of 40 slices shown (3 of 3)]
[im 1/40]
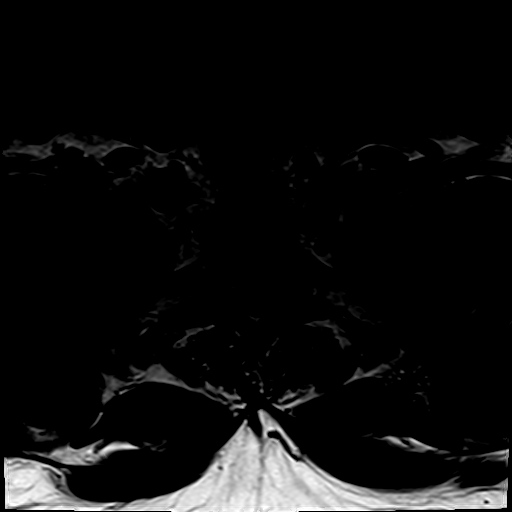

[Series 28: T1 fat-sat post-contrast · sagittal · 3.0mm · 0.43mm/px · 1 of 15 slices shown]
[im 1/15]
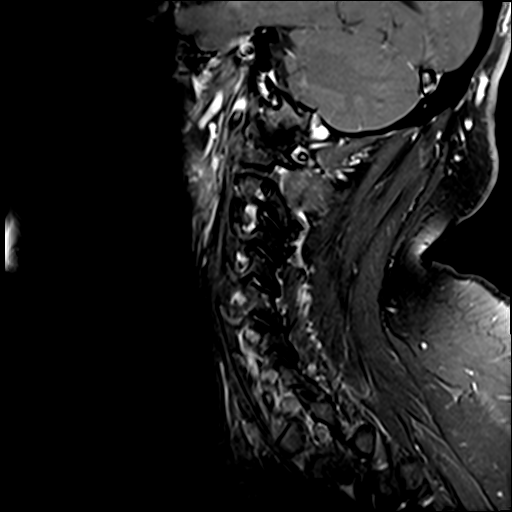

[Series 29: T1 post-contrast · axial · 3.0mm · 0.39mm/px · 1 of 40 slices shown (1 of 3)]
[im 1/40]
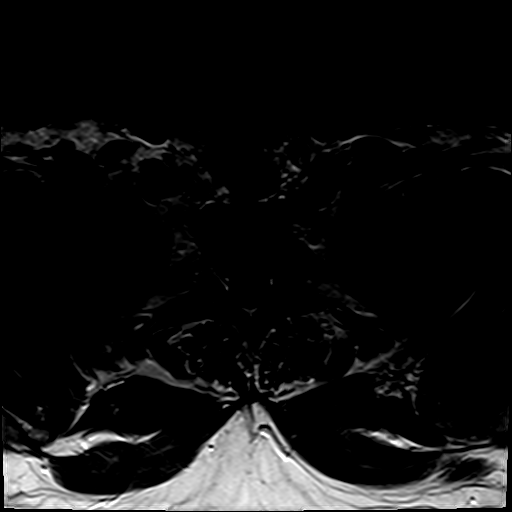

[Series 30: T2 post-contrast · coronal · 5.0mm · 0.72mm/px · 1 of 28 slices shown]
[im 1/28]
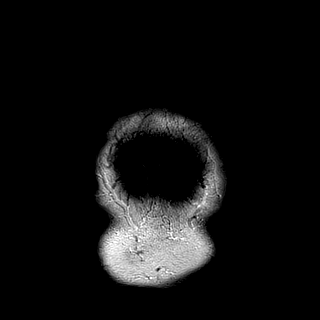

[Series 32: T1 post-contrast · coronal · 5.0mm · 0.34mm/px · 1 of 28 slices shown (2 of 3)]
[im 1/28]
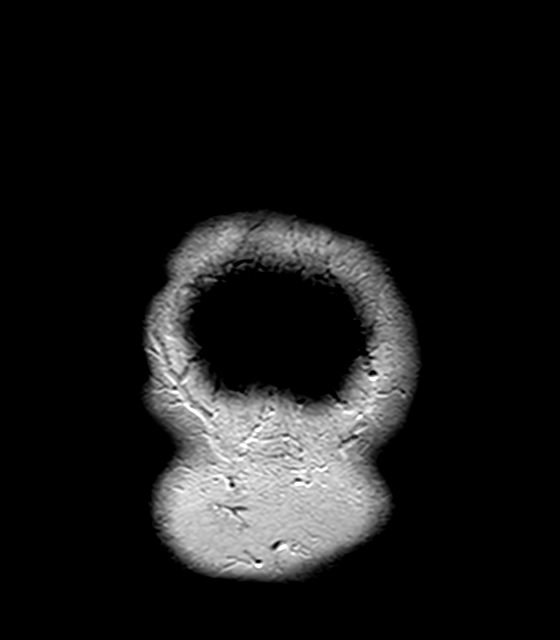

[Series 33: T1 post-contrast · sagittal · 5.0mm · 0.72mm/px · 1 of 25 slices shown (3 of 3)]
[im 1/25]
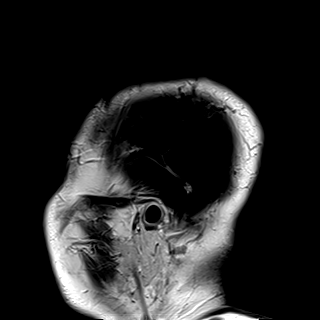

[34 of 48 positions shown; findings below may reference images not displayed]

FINDINGS: MRI HEAD FINDINGS

Brain: Typical multiple sclerosis pattern with numerous FLAIR
hyperintense foci radiating from the lateral ventricles, seen in the
juxta cortical cerebrum, and along the bilateral superficial pons.
Allowing for differences in scanner and protocol, there is no
evident progression. No enhancement or restricted diffusion.
Multiple black holes are seen on T1 weighted imaging. Brain volume
remains normal. There is no infarct, hemorrhage, hydrocephalus,
mass, or collection. No evidence of opportunistic white matter
infection.

Vascular: Major flow voids and vascular enhancements are preserved.

Skull and upper cervical spine: No evidence of marrow lesion

Sinuses/Orbits: Negative

MRI CERVICAL SPINE FINDINGS

Alignment: Normal

Vertebrae: No fracture, evidence of discitis, or bone lesion.

Cord: Extensive demyelinating disease in the cord with
discrete/dominant plaques seen right posteriorly at C2, centrally at
C3-4, confluent plaques at C4 and C5 with cord thinning, left cord
at C6.

Posterior Fossa, vertebral arteries, paraspinal tissues: Reported
separately

Disc levels: C3-4 right uncovertebral spurring and foraminal
stenosis.
IMPRESSION: Brain MRI:

1. Typical multiple sclerosis pattern without evidence of active or
progressive disease.
2. Normal brain volume.

Cervical MRI:

Extensive chronic demyelination with cord thinning at C4 and C5. No
evidence of active or progressive demyelination when compared to

## 2019-11-11 ENCOUNTER — Ambulatory Visit (INDEPENDENT_AMBULATORY_CARE_PROVIDER_SITE_OTHER): Payer: 59 | Admitting: Psychology

## 2019-11-11 DIAGNOSIS — F4323 Adjustment disorder with mixed anxiety and depressed mood: Secondary | ICD-10-CM

## 2019-11-17 ENCOUNTER — Other Ambulatory Visit: Payer: Self-pay | Admitting: Family Medicine

## 2019-11-17 ENCOUNTER — Other Ambulatory Visit: Payer: Self-pay | Admitting: *Deleted

## 2019-11-17 DIAGNOSIS — Z79899 Other long term (current) drug therapy: Secondary | ICD-10-CM

## 2019-11-17 DIAGNOSIS — G35 Multiple sclerosis: Secondary | ICD-10-CM

## 2019-11-17 MED ORDER — METHYLPHENIDATE HCL 10 MG PO TABS: 10.0000 mg | ORAL_TABLET | Freq: Four times a day (QID) | ORAL | 0 refills | Status: DC

## 2019-11-17 NOTE — Addendum Note (Signed)
Addended by: Inis Sizer D on: 11/17/2019 11:09 AM   Modules accepted: Orders

## 2019-11-17 NOTE — Progress Notes (Signed)
Pt here today for infusion. His last JCV was 02/2019. He is due for labs. I placed future orders in epic. Liane, RN aware and let pt know.

## 2019-11-18 LAB — CBC WITH DIFFERENTIAL/PLATELET
Basophils Absolute: 0.1 10*3/uL (ref 0.0–0.2)
Basos: 1 %
EOS (ABSOLUTE): 0.5 10*3/uL — ABNORMAL HIGH (ref 0.0–0.4)
Eos: 7 %
Hematocrit: 40 % (ref 37.5–51.0)
Hemoglobin: 13.5 g/dL (ref 13.0–17.7)
Immature Grans (Abs): 0.1 10*3/uL (ref 0.0–0.1)
Immature Granulocytes: 1 %
Lymphocytes Absolute: 1.9 10*3/uL (ref 0.7–3.1)
Lymphs: 29 %
MCH: 30.3 pg (ref 26.6–33.0)
MCHC: 33.8 g/dL (ref 31.5–35.7)
MCV: 90 fL (ref 79–97)
Monocytes Absolute: 0.6 10*3/uL (ref 0.1–0.9)
Monocytes: 9 %
Neutrophils Absolute: 3.6 10*3/uL (ref 1.4–7.0)
Neutrophils: 53 %
Platelets: 164 10*3/uL (ref 150–450)
RBC: 4.46 x10E6/uL (ref 4.14–5.80)
RDW: 13.9 % (ref 11.6–15.4)
WBC: 6.7 10*3/uL (ref 3.4–10.8)

## 2019-11-21 ENCOUNTER — Telehealth: Payer: Self-pay | Admitting: *Deleted

## 2019-11-21 NOTE — Telephone Encounter (Signed)
-----   Message from Britt Bottom, MD sent at 11/21/2019  2:13 PM EST ----- His CBC was fine.   With a supposed to also check a JCV antibody?  I did not see that lab pending.  It looks like it has been over 6 months so we will need to make sure JCV gets checked at his next infusion if it was not checked at the same time the CBC was

## 2019-11-22 ENCOUNTER — Other Ambulatory Visit: Payer: Self-pay

## 2019-11-22 ENCOUNTER — Telehealth: Payer: Self-pay | Admitting: *Deleted

## 2019-11-22 ENCOUNTER — Other Ambulatory Visit (INDEPENDENT_AMBULATORY_CARE_PROVIDER_SITE_OTHER): Payer: Self-pay

## 2019-11-22 DIAGNOSIS — Z79899 Other long term (current) drug therapy: Secondary | ICD-10-CM

## 2019-11-22 DIAGNOSIS — Z0289 Encounter for other administrative examinations: Secondary | ICD-10-CM

## 2019-11-22 DIAGNOSIS — G35 Multiple sclerosis: Secondary | ICD-10-CM

## 2019-11-22 NOTE — Telephone Encounter (Signed)
Placed JCV lab in [facility] lock box for routine lab pick up. Results pending. 

## 2019-11-25 ENCOUNTER — Ambulatory Visit (INDEPENDENT_AMBULATORY_CARE_PROVIDER_SITE_OTHER): Payer: 59 | Admitting: Psychology

## 2019-11-25 DIAGNOSIS — F4323 Adjustment disorder with mixed anxiety and depressed mood: Secondary | ICD-10-CM | POA: Diagnosis not present

## 2019-11-30 ENCOUNTER — Telehealth (INDEPENDENT_AMBULATORY_CARE_PROVIDER_SITE_OTHER): Payer: Managed Care, Other (non HMO) | Admitting: Pulmonary Disease

## 2019-11-30 ENCOUNTER — Encounter: Payer: Self-pay | Admitting: Pulmonary Disease

## 2019-11-30 DIAGNOSIS — G4733 Obstructive sleep apnea (adult) (pediatric): Secondary | ICD-10-CM

## 2019-11-30 DIAGNOSIS — Z9989 Dependence on other enabling machines and devices: Secondary | ICD-10-CM | POA: Diagnosis not present

## 2019-11-30 DIAGNOSIS — Z7982 Long term (current) use of aspirin: Secondary | ICD-10-CM

## 2019-11-30 DIAGNOSIS — Z791 Long term (current) use of non-steroidal anti-inflammatories (NSAID): Secondary | ICD-10-CM | POA: Diagnosis not present

## 2019-11-30 NOTE — Patient Instructions (Signed)
Will change your auto CPAP to 5 - 17 cm H2O  Follow up in 1 year

## 2019-11-30 NOTE — Telephone Encounter (Signed)
Called [facility] and requested results be faxed to our office.   

## 2019-11-30 NOTE — Progress Notes (Signed)
[facility]  Chief Complaint  Patient presents with  . Follow-up    OSA    Constitutional:  There were no vitals taken for this visit.  Past Medical History:  Allergies, CAD s/p DES, Depression, Anxiety, HTN, HLD, Hypothyroidism, Low T, Multiple sclerosis  Brief Summary:  Keith Morrow is a 48 y.o. male with obstructive sleep apnea.  Virtual Visit via Video Note  I connected with Keith Morrow on 11/30/19 at  9:15 AM EST by a video enabled telemedicine application and verified that I am speaking with the correct person using two identifiers.  Location: Patient: home Provider: medical office   I discussed the limitations of evaluation and management by telemedicine and the availability of in person appointments. The patient expressed understanding and agreed to proceed.  He is using CPAP nightly.  Has full face mask.  Needs replacement liner.  Has been getting more leak.  Denies sinus congestion, sore throat.  Has some mouth dryness.  Feels more alert during the day and hasn't needed to use extra dose of ritalin recently.  Physical Exam:   Alert, normal speech, no stridor, normal mood/behavior.  Assessment/Plan:   Obstructive sleep apnea. - he is compliant with CPAP and reports benefit - will change his auto CPAP setting to 5 -17 cm H2O - he will call if he needs help with mask refitting   Patient Instructions  Will change your auto CPAP to 5 - 17 cm H2O  Follow up in 1 year   I discussed the assessment and treatment plan with the patient. The patient was provided an opportunity to ask questions and all were answered. The patient agreed with the plan and demonstrated an understanding of the instructions.   The patient was advised to call back or seek an in-person evaluation if the symptoms worsen or if the condition fails to improve as anticipated.  I provided 17 minutes of non-face-to-face time during this  encounter.   Chesley Mires, MD [facility] Pager: [phone] 11/30/2019, 9:26 AM  Flow Sheet    Sleep tests:  HST 09/14/19 >> AHI 28.2, SpO2 low 78%. Auto CPAP 10/31/19 to 11/29/19 >> used on 30 of 30 nights with average 9 hrs 40 min.  Average AHI 12.2 with median CPAP 12 and 95 th percentile CPAP 15 cm H2O.  Medications:   Allergies as of 11/30/2019   No Known Allergies     Medication List       Accurate as of November 30, 2019  9:26 AM. If you have any questions, ask your nurse or doctor.        aspirin EC 81 MG tablet Take 81 mg by mouth daily.   atorvastatin 40 MG tablet Commonly known as: LIPITOR Take 1 tablet (40 mg total) by mouth daily at 6 PM.   BACLOFEN IT 1 Dose by Intrathecal route as directed.   baclofen 10 MG tablet Commonly known as: LIORESAL Take 1 tablet (10 mg total) by mouth 3 (three) times daily as needed for muscle spasms.   cetirizine 10 MG tablet Commonly known as: ZYRTEC Take 10 mg by mouth daily as needed for allergies.   clobetasol cream 0.05 % Commonly known as: TEMOVATE Apply 1 application topically 2 (two) times daily as needed (skin).   clonazePAM 1 MG tablet Commonly known as: KLONOPIN TAKE 1 TABLET BY MOUTH 4 TIMES A DAY   Clonidine HCl Powd by Does not apply route. 720mg /ml IT Baclofen. Total  dose 157.89mg /day. Last pump refill 06/02/16   clotrimazole-betamethasone cream Commonly known as: LOTRISONE Apply 1 application topically 2 (two) times daily as needed (anti-fungal).   cyclobenzaprine 10 MG tablet Commonly known as: FLEXERIL Take 1 tablet (10 mg total) by mouth 3 (three) times daily as needed for muscle spasms.   Cymbalta 60 MG capsule Generic drug: DULoxetine TAKE 1 CAPSULE (60 MG TOTAL) BY MOUTH 2 (TWO) TIMES DAILY.   dalfampridine 10 MG Tb12 Commonly known as: Ampyra Take 1 tablet (10 mg total) by mouth every 12 (twelve) hours.   famotidine 40 MG tablet Commonly known as:  PEPCID Take 1 tablet (40 mg total) by mouth at bedtime.   gabapentin 300 MG capsule Commonly known as: NEURONTIN TAKE ONE CAPSULE BY MOUTH IN THE MORNING ONE CAP BY MOUTH IN THE EVENING AND 2 CAPS AT BEDTIME   ibuprofen 200 MG tablet Commonly known as: ADVIL Take 200 mg by mouth every 6 (six) hours as needed for moderate pain.   methylphenidate 10 MG tablet Commonly known as: RITALIN Take 1 tablet (10 mg total) by mouth 4 (four) times daily.   methylPREDNISolone 4 MG tablet Commonly known as: Medrol 5 tab po qd X 1d then 4 tab po qd X 1d then 3 tab po qd X 1d then 2 tab po qd then 1 tab po qd   nitroGLYCERIN 0.4 MG SL tablet Commonly known as: NITROSTAT PLACE 1 TABLET UNDER THE TOUNGE EVERY 5 MINUTES AS NEEDED FOR CHEST PAIN (X 3 DOSES)   nystatin cream Commonly known as: MYCOSTATIN Apply 1 application topically as directed.   olmesartan 20 MG tablet Commonly known as: BENICAR TAKE 1 TABLET BY MOUTH EVERY DAY   omeprazole 40 MG capsule Commonly known as: PRILOSEC TAKE 1 CAPSULE BY MOUTH EVERY DAY   sucralfate 1 g tablet Commonly known as: Carafate Take 1 tablet (1 g total) by mouth 4 (four) times daily -  with meals and at bedtime. Take tablet or liquid but not both   sucralfate 1 GM/10ML suspension Commonly known as: Carafate Take 10 mLs (1 g total) by mouth 4 (four) times daily -  with meals and at bedtime. Take liquid or tablets but not both   Synthroid 150 MCG tablet Generic drug: levothyroxine TAKE 1 TABLET (150 MCG TOTAL) BY MOUTH DAILY BEFORE BREAKFAST.   tamsulosin 0.4 MG Caps capsule Commonly known as: FLOMAX TAKE 1 CAPSULE BY MOUTH EVERY DAY   triamcinolone cream 0.1 % Commonly known as: KENALOG Apply 1 application topically 2 (two) times daily.   Tysabri 300 MG/15ML injection Generic drug: natalizumab Inject 300 mg into the vein every 28 (twenty-eight) days.       Past Surgical History:  He  has a past surgical history that includes right  wrist surgery; Vasectomy; Vascular surgery; Wisdom tooth extraction; Coronary angioplasty with stent; Pain pump implantation (N/A, 08/29/2014); and Programable baclofen pump revision (08/29/14).  Family History:  His family history includes Coronary artery disease in an other family member; Heart attack (age of onset: 36) in his mother; Heart disease in his father, maternal grandfather, maternal grandmother, and mother; Hyperlipidemia in his maternal grandfather and maternal grandmother; Hypertension in his maternal grandfather and maternal grandmother; Stroke in his paternal grandfather; Thyroid disease in his brother, mother, sister, and sister.  Social History:  He  reports that he has never smoked. He has never used smokeless tobacco. He reports current alcohol use. He reports that he does not use drugs.

## 2019-12-04 LAB — STRATIFY JCV AB (W/ INDEX) W/ RFLX
Index Value: 0.2 — ABNORMAL HIGH
Stratify JCV (TM) Ab w/Reflex Inhibition: UNDETERMINED — AB

## 2019-12-04 LAB — RFLX STRATIFY JCV (TM) AB INHIBITION: JCV Antibody by Inhibition: NEGATIVE

## 2019-12-10 ENCOUNTER — Other Ambulatory Visit: Payer: Self-pay | Admitting: Family Medicine

## 2019-12-23 ENCOUNTER — Ambulatory Visit (INDEPENDENT_AMBULATORY_CARE_PROVIDER_SITE_OTHER): Payer: 59 | Admitting: Psychology

## 2019-12-23 DIAGNOSIS — F4323 Adjustment disorder with mixed anxiety and depressed mood: Secondary | ICD-10-CM | POA: Diagnosis not present

## 2020-01-03 ENCOUNTER — Other Ambulatory Visit: Payer: Self-pay | Admitting: Neurology

## 2020-01-03 ENCOUNTER — Other Ambulatory Visit: Payer: Self-pay

## 2020-01-05 ENCOUNTER — Other Ambulatory Visit: Payer: Self-pay

## 2020-01-05 ENCOUNTER — Ambulatory Visit (INDEPENDENT_AMBULATORY_CARE_PROVIDER_SITE_OTHER): Payer: Managed Care, Other (non HMO) | Admitting: Family Medicine

## 2020-01-05 ENCOUNTER — Ambulatory Visit (INDEPENDENT_AMBULATORY_CARE_PROVIDER_SITE_OTHER): Payer: 59 | Admitting: Psychology

## 2020-01-05 ENCOUNTER — Encounter: Payer: Self-pay | Admitting: Family Medicine

## 2020-01-05 VITALS — BP 142/79 | HR 78 | Temp 96.4°F | Resp 18 | Wt 299.4 lb

## 2020-01-05 DIAGNOSIS — R600 Localized edema: Secondary | ICD-10-CM

## 2020-01-05 DIAGNOSIS — E785 Hyperlipidemia, unspecified: Secondary | ICD-10-CM

## 2020-01-05 DIAGNOSIS — E039 Hypothyroidism, unspecified: Secondary | ICD-10-CM | POA: Diagnosis not present

## 2020-01-05 DIAGNOSIS — I10 Essential (primary) hypertension: Secondary | ICD-10-CM

## 2020-01-05 DIAGNOSIS — N529 Male erectile dysfunction, unspecified: Secondary | ICD-10-CM

## 2020-01-05 DIAGNOSIS — R0600 Dyspnea, unspecified: Secondary | ICD-10-CM | POA: Diagnosis not present

## 2020-01-05 DIAGNOSIS — F418 Other specified anxiety disorders: Secondary | ICD-10-CM

## 2020-01-05 DIAGNOSIS — F4323 Adjustment disorder with mixed anxiety and depressed mood: Secondary | ICD-10-CM | POA: Diagnosis not present

## 2020-01-05 LAB — CBC
HCT: 41.1 % (ref 39.0–52.0)
Hemoglobin: 13.7 g/dL (ref 13.0–17.0)
MCHC: 33.2 g/dL (ref 30.0–36.0)
MCV: 89.3 fl (ref 78.0–100.0)
Platelets: 173 10*3/uL (ref 150.0–400.0)
RBC: 4.61 Mil/uL (ref 4.22–5.81)
RDW: 14.4 % (ref 11.5–15.5)
WBC: 6.8 10*3/uL (ref 4.0–10.5)

## 2020-01-05 LAB — LIPID PANEL
Cholesterol: 146 mg/dL (ref 0–200)
HDL: 57.1 mg/dL (ref >39.00)
LDL Cholesterol: 81 mg/dL (ref 0–99)
NonHDL: 89.09
Total CHOL/HDL Ratio: 3
Triglycerides: 40 mg/dL (ref 0.0–149.0)
VLDL: 8 mg/dL (ref 0.0–40.0)

## 2020-01-05 LAB — COMPREHENSIVE METABOLIC PANEL
ALT: 22 U/L (ref 0–53)
AST: 16 U/L (ref 0–37)
Albumin: 4.1 g/dL (ref 3.5–5.2)
Alkaline Phosphatase: 77 U/L (ref 39–117)
BUN: 13 mg/dL (ref 6–23)
CO2: 32 mEq/L (ref 19–32)
Calcium: 9 mg/dL (ref 8.4–10.5)
Chloride: 104 mEq/L (ref 96–112)
Creatinine, Ser: 0.8 mg/dL (ref 0.40–1.50)
GFR: 103.68 mL/min (ref >60.00)
Glucose, Bld: 93 mg/dL (ref 70–99)
Potassium: 4 mEq/L (ref 3.5–5.1)
Sodium: 141 mEq/L (ref 135–145)
Total Bilirubin: 0.5 mg/dL (ref 0.2–1.2)
Total Protein: 6.3 g/dL (ref 6.0–8.3)

## 2020-01-05 LAB — TSH: TSH: 1.28 u[IU]/mL (ref 0.35–4.50)

## 2020-01-05 MED ORDER — SUCRALFATE 1 G PO TABS: 1.0000 g | ORAL_TABLET | Freq: Three times a day (TID) | ORAL | 1 refills | Status: DC

## 2020-01-05 MED ORDER — POTASSIUM CHLORIDE CRYS ER 20 MEQ PO TBCR: EXTENDED_RELEASE_TABLET | ORAL | 3 refills | Status: DC

## 2020-01-05 MED ORDER — ALPROSTADIL (VASODILATOR) 125 MCG UR PLLT: 125.0000 ug | PELLET | URETHRAL | 1 refills | Status: DC | PRN

## 2020-01-05 MED ORDER — FUROSEMIDE 20 MG PO TABS: ORAL_TABLET | ORAL | 3 refills | Status: DC

## 2020-01-05 NOTE — Assessment & Plan Note (Signed)
meds not working as well any more. He requests a prescription for Muse to try transuethrally. Is allowed a prescription for the 125 mcg suppositories to try

## 2020-01-05 NOTE — Assessment & Plan Note (Signed)
Tolerating statin, encouraged heart healthy diet, avoid trans fats, minimize simple carbs and saturated fats. Increase exercise as tolerated 

## 2020-01-05 NOTE — Assessment & Plan Note (Signed)
Well controlled, no changes to meds. Encouraged heart healthy diet such as the DASH diet and exercise as tolerated.  °

## 2020-01-05 NOTE — Assessment & Plan Note (Signed)
On Levothyroxine, continue to monitor 

## 2020-01-05 NOTE — Assessment & Plan Note (Signed)
Elevate feet, compression hose, decrease sodium, started on Lasix 20 mg tabs po daily x 5 days and then as needed for weight gain>3# in 24 hours, increased SOB or increased edema. He may take a second tab daily if no results but will take a potassium 20 meq tablet when she does. Prescriptions given

## 2020-01-05 NOTE — Assessment & Plan Note (Signed)
Deconditioning is contributing but SOB worsening. Proceed with Echo to further investigate.

## 2020-01-05 NOTE — Progress Notes (Signed)
Subjective:    Patient ID: Keith Morrow, male    DOB: 01-23-1971, 49 y.o.   MRN: [id number]  Chief Complaint  Patient presents with  . Follow-up    HPI Patient is in today for follow up on chronic medical concerns. No recent febrile illness or hospitalizations. His father in last passed away this past weekend and their family dog of 14 years has also died so he has had a great deal of stress. He is seeing his counselor twice a week and that is helping him get through. He notes increased pedal edema bilaterally. Better in am worse by end of day. He has become more wheelchair bound, inactive and deconditioned. He notes increased SOB as well. Denies CP/palp/HA/congestion/fevers/GI or GU c/o. Taking meds as prescribed  Past Medical History:  Diagnosis Date  . Acute bronchitis 05/09/2013  . Allergy   . Bronchitis, acute 12/10/2011  . Candidal skin infection 05/05/2017  . Chicken pox as a child  . Constipation 02/20/2017  . Coronary atherosclerosis of native coronary artery    a. NSTEMI 04/20/11: tx with Promus DES to LAD; bifurcation lesion with oDx 90% tx with POBA; b. staged PCI of right post. AV Branch with Promus DES; c. residual at cath 04/21/11:  AV groove CFX 50%, mRCA 30%,; EF 55%  . Depression   . Depression with anxiety   . Dermatitis, contact 12/10/2011  . Fatigue 10/15/2012  . Heart attack (HCC) 04-20-11  . HTN (hypertension)   . Hyperlipidemia   . Hypotestosteronism 11/17/2011  . Hypothyroidism   . Keloid scar   . MS (multiple sclerosis) (HCC)   . Neuromuscular disorder (HCC)    NUMBNESS/TINGLING  . Obesity   . Onychomycosis   . Preventative health care 10/07/2011  . Reflux 10/07/2011  . Sun-damaged skin 02/17/2016  . Testosterone deficiency 01/16/2012  . Tinea pedis   . 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 Visual changes 11/13/2016    Past Surgical History:  Procedure Laterality Date  . CORONARY ANGIOPLASTY WITH STENT PLACEMENT     2012  . PAIN PUMP  IMPLANTATION N/A 08/29/2014   Procedure: Baclofen pump placement;  Surgeon: Maeola Harman, MD;  Location: [facility];  Service: Neurosurgery;  Laterality: N/A;  Baclofen pump placement  . PROGRAMABLE BACLOFEN PUMP REVISION  08/29/14  . right wrist surgery     CTR  . VASCULAR SURGERY     vericose vein in right femoral area  . VASECTOMY    . WISDOM TOOTH EXTRACTION      Family History  Problem Relation Age of Onset  . Heart attack Mother 43  . Thyroid disease Mother   . Heart disease Mother   . Thyroid disease Sister   . Thyroid disease Brother   . Heart disease Maternal Grandmother   . Hypertension Maternal Grandmother   . Hyperlipidemia Maternal Grandmother   . Heart disease Maternal Grandfather   . Hypertension Maternal Grandfather   . Hyperlipidemia Maternal Grandfather   . Stroke Paternal Grandfather   . Thyroid disease Sister   . Heart disease Father        2 stents  . Coronary artery disease Other        questionable in father    Social History   Socioeconomic History  . Marital status: Married    Spouse name: Not on file  . Number of children: 3  . Years of education: Not on file  . Highest education level: Not on file  Occupational History  . Not on file  Tobacco Use  . Smoking status: Never Smoker  . Smokeless tobacco: Never Used  Substance and Sexual Activity  . Alcohol use: Yes    Comment: socially  . Drug use: No  . Sexual activity: Yes    Partners: Female    Comment: 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, works Engineer, maintenance, no dietary restrictions  Other Topics Concern  . Not on file  Social History Narrative  . Not on file   Social Determinants of Health   Financial Resource Strain:   . Difficulty of Paying Living Expenses: Not on file  Food Insecurity:   . Worried About Programme researcher, broadcasting/film/video in the Last Year: Not on file  . Ran Out of Food in the Last Year: Not on file  Transportation Needs:   . Lack of Transportation (Medical): Not on file  .  Lack of Transportation (Non-Medical): Not on file  Physical Activity:   . Days of Exercise per Week: Not on file  . Minutes of Exercise per Session: Not on file  Stress:   . Feeling of Stress : Not on file  Social Connections:   . Frequency of Communication with Friends and Family: Not on file  . Frequency of Social Gatherings with Friends and Family: Not on file  . Attends Religious Services: Not on file  . Active Member of Clubs or Organizations: Not on file  . Attends Banker Meetings: Not on file  . Marital Status: Not on file  Intimate Partner Violence:   . Fear of Current or Ex-Partner: Not on file  . Emotionally Abused: Not on file  . Physically Abused: Not on file  . Sexually Abused: Not on file    Outpatient Medications Prior to Visit  Medication Sig Dispense Refill  . aspirin EC 81 MG tablet Take 81 mg by mouth daily.    Marland Kitchen atorvastatin (LIPITOR) 40 MG tablet Take 1 tablet (40 mg total) by mouth daily at 6 PM. 90 tablet 3  . baclofen (LIORESAL) 10 MG tablet Take 1 tablet (10 mg total) by mouth 3 (three) times daily as needed for muscle spasms. 90 each 5  . BACLOFEN IT 1 Dose by Intrathecal route as directed.     . cetirizine (ZYRTEC) 10 MG tablet Take 10 mg by mouth daily as needed for allergies.     . clobetasol cream (TEMOVATE) 0.05 % Apply 1 application topically 2 (two) times daily as needed (skin).    . clonazePAM (KLONOPIN) 1 MG tablet TAKE 1 TABLET BY MOUTH 4 TIMES A DAY 360 tablet 1  . Clonidine HCl POWD by Does not apply route. 720mg /ml IT Baclofen. Total dose 157.89mg /day. Last pump refill 06/02/16    . clotrimazole-betamethasone (LOTRISONE) cream Apply 1 application topically 2 (two) times daily as needed (anti-fungal). 30 g 2  . cyclobenzaprine (FLEXERIL) 10 MG tablet Take 1 tablet (10 mg total) by mouth 3 (three) times daily as needed for muscle spasms. 90 tablet 11  . CYMBALTA 60 MG capsule TAKE 1 CAPSULE (60 MG TOTAL) BY MOUTH 2 (TWO) TIMES DAILY.  180 capsule 1  . dalfampridine 10 MG TB12 TAKE 1 TABLET (10 MG TOTAL) EVERY 12 HOURS 180 tablet 3  . famotidine (PEPCID) 40 MG tablet Take 1 tablet (40 mg total) by mouth at bedtime. 90 tablet 3  . gabapentin (NEURONTIN) 300 MG capsule TAKE ONE CAPSULE BY MOUTH IN THE MORNING ONE CAP BY MOUTH IN THE EVENING AND  2 CAPS AT BEDTIME 360 capsule 1  . ibuprofen (ADVIL,MOTRIN) 200 MG tablet Take 200 mg by mouth every 6 (six) hours as needed for moderate pain.    . methylphenidate (RITALIN) 10 MG tablet Take 1 tablet (10 mg total) by mouth 4 (four) times daily. 120 tablet 0  . methylPREDNISolone (MEDROL) 4 MG tablet 5 tab po qd X 1d then 4 tab po qd X 1d then 3 tab po qd X 1d then 2 tab po qd then 1 tab po qd 15 tablet 1  . natalizumab (TYSABRI) 300 MG/15ML injection Inject 300 mg into the vein every 28 (twenty-eight) days.     . nitroGLYCERIN (NITROSTAT) 0.4 MG SL tablet PLACE 1 TABLET UNDER THE TOUNGE EVERY 5 MINUTES AS NEEDED FOR CHEST PAIN (X 3 DOSES) 25 tablet 0  . nystatin cream (MYCOSTATIN) Apply 1 application topically as directed. 30 g 2  . olmesartan (BENICAR) 20 MG tablet TAKE 1 TABLET BY MOUTH EVERY DAY 90 tablet 1  . omeprazole (PRILOSEC) 40 MG capsule TAKE 1 CAPSULE BY MOUTH EVERY DAY 90 capsule 1  . sucralfate (CARAFATE) 1 GM/10ML suspension Take 10 mLs (1 g total) by mouth 4 (four) times daily -  with meals and at bedtime. Take liquid or tablets but not both 420 mL 3  . SYNTHROID 150 MCG tablet TAKE 1 TABLET BY MOUTH DAILY BEFORE BREAKFAST. 90 tablet 0  . tamsulosin (FLOMAX) 0.4 MG CAPS capsule TAKE 1 CAPSULE BY MOUTH EVERY DAY 90 capsule 3  . triamcinolone cream (KENALOG) 0.1 % Apply 1 application topically 2 (two) times daily. 30 g 2  . sucralfate (CARAFATE) 1 g tablet Take 1 tablet (1 g total) by mouth 4 (four) times daily -  with meals and at bedtime. Take tablet or liquid but not both 120 tablet 1   No facility-administered medications prior to visit.    No Known Allergies  Review  of Systems  Constitutional: Positive for malaise/fatigue. Negative for fever.  HENT: Negative for congestion.   Eyes: Negative for blurred vision.  Respiratory: Positive for shortness of breath.   Cardiovascular: Negative for chest pain, palpitations and leg swelling.  Gastrointestinal: Negative for abdominal pain, blood in stool and nausea.  Genitourinary: Negative for dysuria and frequency.  Musculoskeletal: Negative for falls.  Skin: Negative for rash.  Neurological: Positive for weakness. Negative for dizziness, loss of consciousness and headaches.  Endo/Heme/Allergies: Negative for environmental allergies.  Psychiatric/Behavioral: Positive for depression. Negative for suicidal ideas. The patient is nervous/anxious.        Objective:    Physical Exam Vitals and nursing note reviewed.  Constitutional:      General: He is not in acute distress.    Appearance: Normal appearance. He is well-developed. He is obese. He is not ill-appearing.     Comments: In wheelchair  HENT:     Head: Normocephalic and atraumatic.     Nose: Nose normal.  Eyes:     General:        Right eye: No discharge.        Left eye: No discharge.  Cardiovascular:     Rate and Rhythm: Normal rate and regular rhythm.     Heart sounds: No murmur.  Pulmonary:     Effort: Pulmonary effort is normal.     Breath sounds: Normal breath sounds.  Abdominal:     General: Bowel sounds are normal.     Palpations: Abdomen is soft.     Tenderness: There is no  abdominal tenderness.  Musculoskeletal:     Cervical back: Normal range of motion and neck supple.  Skin:    General: Skin is warm and dry.  Neurological:     Mental Status: He is alert and oriented to person, place, and time.  Psychiatric:        Behavior: Behavior normal.     BP (!) 142/79 (BP Location: Left Arm, Patient Position: Sitting, Cuff Size: Normal)   Pulse 78   Temp (!) 96.4 F (35.8 C) (Temporal)   Resp 18   Wt 299 lb 6.4 oz (135.8 kg)    SpO2 100%   BMI 36.44 kg/m  Wt Readings from Last 3 Encounters:  01/05/20 299 lb 6.4 oz (135.8 kg)  10/06/19 275 lb (124.7 kg)  10/03/19 275 lb (124.7 kg)    Diabetic Foot Exam - Simple   No data filed     Lab Results  Component Value Date   WBC 6.8 01/05/2020   HGB 13.7 01/05/2020   HCT 41.1 01/05/2020   PLT 173.0 01/05/2020   GLUCOSE 93 01/05/2020   CHOL 146 01/05/2020   TRIG 40.0 01/05/2020   HDL 57.10 01/05/2020   LDLCALC 81 01/05/2020   ALT 22 01/05/2020   AST 16 01/05/2020   NA 141 01/05/2020   K 4.0 01/05/2020   CL 104 01/05/2020   CREATININE 0.80 01/05/2020   BUN 13 01/05/2020   CO2 32 01/05/2020   TSH 1.28 01/05/2020   PSA 0.52 02/11/2016   INR 1.00 04/21/2011    Lab Results  Component Value Date   TSH 1.28 01/05/2020   Lab Results  Component Value Date   WBC 6.8 01/05/2020   HGB 13.7 01/05/2020   HCT 41.1 01/05/2020   MCV 89.3 01/05/2020   PLT 173.0 01/05/2020   Lab Results  Component Value Date   NA 141 01/05/2020   K 4.0 01/05/2020   CO2 32 01/05/2020   GLUCOSE 93 01/05/2020   BUN 13 01/05/2020   CREATININE 0.80 01/05/2020   BILITOT 0.5 01/05/2020   ALKPHOS 77 01/05/2020   AST 16 01/05/2020   ALT 22 01/05/2020   PROT 6.3 01/05/2020   ALBUMIN 4.1 01/05/2020   CALCIUM 9.0 01/05/2020   ANIONGAP 10 12/07/2015   GFR 103.68 01/05/2020   Lab Results  Component Value Date   CHOL 146 01/05/2020   Lab Results  Component Value Date   HDL 57.10 01/05/2020   Lab Results  Component Value Date   LDLCALC 81 01/05/2020   Lab Results  Component Value Date   TRIG 40.0 01/05/2020   Lab Results  Component Value Date   CHOLHDL 3 01/05/2020   No results found for: HGBA1C     Assessment & Plan:   Problem List Items Addressed This Visit    Hyperlipidemia    Tolerating statin, encouraged heart healthy diet, avoid trans fats, minimize simple carbs and saturated fats. Increase exercise as tolerated      Relevant Medications    furosemide (LASIX) 20 MG tablet   alprostadil (MUSE) 125 MCG pellet   Other Relevant Orders   Lipid panel (Completed)   Hypothyroidism    On Levothyroxine, continue to monitor      Relevant Orders   TSH (Completed)   HTN (hypertension)    Well controlled, no changes to meds. Encouraged heart healthy diet such as the DASH diet and exercise as tolerated.       Relevant Medications   furosemide (LASIX) 20 MG tablet  alprostadil (MUSE) 125 MCG pellet   Other Relevant Orders   CBC (Completed)   Comprehensive metabolic panel (Completed)   Depression with anxiety    His 13 year old dog and his father in law has died recently. This has added a good deal of stress. He continues to follow with his counselor twice a week. Does not need medication adjustments at this time.       Pedal edema    Elevate feet, compression hose, decrease sodium, started on Lasix 20 mg tabs po daily x 5 days and then as needed for weight gain>3# in 24 hours, increased SOB or increased edema. He may take a second tab daily if no results but will take a potassium 20 meq tablet when she does. Prescriptions given      Dyspnea - Primary    Deconditioning is contributing but SOB worsening. Proceed with Echo to further investigate.       Relevant Orders   ECHOCARDIOGRAM COMPLETE   Erectile dysfunction    meds not working as well any more. He requests a prescription for Muse to try transuethrally. Is allowed a prescription for the 125 mcg suppositories to try         I am having Youlanda Roys. Andon start on furosemide, potassium chloride SA, and alprostadil. I am also having him maintain his cetirizine, aspirin EC, ibuprofen, BACLOFEN IT, natalizumab, Clonidine HCl, clotrimazole-betamethasone, nitroGLYCERIN, clobetasol cream, nystatin cream, cyclobenzaprine, baclofen, atorvastatin, tamsulosin, sucralfate, methylPREDNISolone, famotidine, triamcinolone cream, clonazePAM, olmesartan, gabapentin, methylphenidate, Cymbalta,  omeprazole, Synthroid, dalfampridine, and sucralfate.  Meds ordered this encounter  Medications  . furosemide (LASIX) 20 MG tablet    Sig: 1 ta po daily x 5 days and then as needed for SOB, weight gain>3#, pedal edema and can take a second tab daily as needed    Dispense:  45 tablet    Refill:  3  . potassium chloride SA (KLOR-CON) 20 MEQ tablet    Sig: 1 tab daily prn when taking a second lasix tab any given day.    Dispense:  30 tablet    Refill:  3  . DISCONTD: sucralfate (CARAFATE) 1 g tablet    Sig: Take 1 tablet (1 g total) by mouth 4 (four) times daily -  with meals and at bedtime. Take tablet or liquid but not both    Dispense:  120 tablet    Refill:  1  . sucralfate (CARAFATE) 1 g tablet    Sig: Take 1 tablet (1 g total) by mouth 4 (four) times daily -  with meals and at bedtime. Take tablet or liquid but not both    Dispense:  120 tablet    Refill:  1  . alprostadil (MUSE) 125 MCG pellet    Sig: 1 each (125 mcg total) by Transurethral route as needed for erectile dysfunction (makes 2 doses in 24 hours). use no more than 3 times per week    Dispense:  6 each    Refill:  1     Penni Homans, MD

## 2020-01-05 NOTE — Assessment & Plan Note (Signed)
His 47 year old dog and his father in law has died recently. This has added a good deal of stress. He continues to follow with his counselor twice a week. Does not need medication adjustments at this time.

## 2020-01-05 NOTE — Patient Instructions (Addendum)
[email or web address]   Multivitamin with minerals, selenium Vitamin D 2000 IU daily Probiotics daily, lactobacillus and bifidophilus Aspirin EC 81 mg daily  Melatonin 2-5 mg at bedtime Edema  Edema is an abnormal buildup of fluids in the body tissues and under the skin. Swelling of the legs, feet, and ankles is a common symptom that becomes more likely as you get older. Swelling is also common in looser tissues, like around the eyes. When the affected area is squeezed, the fluid may move out of that spot and leave a dent for a few moments. This dent is called pitting edema. There are many possible causes of edema. Eating too much salt (sodium) and being on your feet or sitting for a long time can cause edema in your legs, feet, and ankles. Hot weather may make edema worse. Common causes of edema include:  Heart failure.  Liver or kidney disease.  Weak leg blood vessels.  Cancer.  An injury.  Pregnancy.  Medicines.  Being obese.  Low protein levels in the blood. Edema is usually painless. Your skin may look swollen or shiny. Follow these instructions at home:  Keep the affected body part raised (elevated) above the level of your heart when you are sitting or lying down.  Do not sit still or stand for long periods of time.  Do not wear tight clothing. Do not wear garters on your upper legs.  Exercise your legs to get your circulation going. This helps to move the fluid back into your blood vessels, and it may help the swelling go down.  Wear elastic bandages or support stockings to reduce swelling as told by your health care provider.  Eat a low-salt (low-sodium) diet to reduce fluid as told by your health care provider.  Depending on the cause of your swelling, you may need to limit how much fluid you drink (fluid restriction).  Take over-the-counter and prescription medicines only as told by your health care provider. Contact a  health care provider if:  Your edema does not get better with treatment.  You have heart, liver, or kidney disease and have symptoms of edema.  You have sudden and unexplained weight gain. Get help right away if:  You develop shortness of breath or chest pain.  You cannot breathe when you lie down.  You develop pain, redness, or warmth in the swollen areas.  You have heart, liver, or kidney disease and suddenly get edema.  You have a fever and your symptoms suddenly get worse. Summary  Edema is an abnormal buildup of fluids in the body tissues and under the skin.  Eating too much salt (sodium) and being on your feet or sitting for a long time can cause edema in your legs, feet, and ankles.  Keep the affected body part raised (elevated) above the level of your heart when you are sitting or lying down. This information is not intended to replace advice given to you by your health care provider. Make sure you discuss any questions you have with your health care provider. Document Revised: 04/13/2019 Document Reviewed: 12/27/2016 Elsevier Patient Education  2020 [employer].

## 2020-01-09 ENCOUNTER — Encounter: Payer: Self-pay | Admitting: Family Medicine

## 2020-01-09 ENCOUNTER — Telehealth: Payer: Self-pay | Admitting: *Deleted

## 2020-01-09 NOTE — Telephone Encounter (Signed)
Faxed completed/signed Tysabri pt status report and reauth questionnaire to [facility] at [phone]. Received confirmation.  

## 2020-01-10 ENCOUNTER — Telehealth: Payer: Self-pay

## 2020-01-10 MED ORDER — ALPROSTADIL (VASODILATOR) 125 MCG UR PLLT: 125.0000 ug | PELLET | URETHRAL | 1 refills | Status: DC | PRN

## 2020-01-10 NOTE — Telephone Encounter (Signed)
Received fax from touch prescribing program that pt re-authorized for Tysabri from 01/09/20-08/03/20. Pt enrollment number: [id number]. Account: [employer]. Site auth number: [id number].

## 2020-01-10 NOTE — Telephone Encounter (Signed)
PA initiated via Covermymeds; KEY: [id number].   PA not needed. Already covered by plan.

## 2020-01-11 ENCOUNTER — Ambulatory Visit (HOSPITAL_BASED_OUTPATIENT_CLINIC_OR_DEPARTMENT_OTHER)
Admission: RE | Admit: 2020-01-11 | Discharge: 2020-01-11 | Disposition: A | Discharge status: Home / Self Care | Payer: Managed Care, Other (non HMO) | Source: Ambulatory Visit | Attending: Family Medicine | Admitting: Family Medicine

## 2020-01-11 ENCOUNTER — Other Ambulatory Visit: Payer: Self-pay | Admitting: *Deleted

## 2020-01-11 ENCOUNTER — Other Ambulatory Visit: Payer: Self-pay

## 2020-01-11 DIAGNOSIS — R0600 Dyspnea, unspecified: Secondary | ICD-10-CM | POA: Insufficient documentation

## 2020-01-11 MED ORDER — DALFAMPRIDINE ER 10 MG PO TB12: ORAL_TABLET | ORAL | 3 refills | Status: DC

## 2020-01-11 NOTE — Progress Notes (Signed)
  Echocardiogram 2D Echocardiogram has been performed.  Keith Morrow 01/11/2020, 10:19 AM

## 2020-01-20 ENCOUNTER — Ambulatory Visit (INDEPENDENT_AMBULATORY_CARE_PROVIDER_SITE_OTHER): Payer: 59 | Admitting: Psychology

## 2020-01-20 ENCOUNTER — Encounter: Payer: Self-pay | Admitting: Family Medicine

## 2020-01-20 ENCOUNTER — Other Ambulatory Visit: Payer: Self-pay | Admitting: Family Medicine

## 2020-01-20 DIAGNOSIS — F4323 Adjustment disorder with mixed anxiety and depressed mood: Secondary | ICD-10-CM | POA: Diagnosis not present

## 2020-01-20 MED ORDER — OLMESARTAN MEDOXOMIL-HCTZ 20-12.5 MG PO TABS: 1.0000 | ORAL_TABLET | Freq: Every day | ORAL | 1 refills | Status: DC

## 2020-01-24 ENCOUNTER — Other Ambulatory Visit: Payer: Self-pay | Admitting: *Deleted

## 2020-01-24 MED ORDER — METHYLPHENIDATE HCL 10 MG PO TABS: 10.0000 mg | ORAL_TABLET | Freq: Four times a day (QID) | ORAL | 0 refills | Status: DC

## 2020-01-31 ENCOUNTER — Other Ambulatory Visit: Payer: Self-pay | Admitting: Family Medicine

## 2020-02-03 ENCOUNTER — Ambulatory Visit (INDEPENDENT_AMBULATORY_CARE_PROVIDER_SITE_OTHER): Payer: 59 | Admitting: Psychology

## 2020-02-03 DIAGNOSIS — F4323 Adjustment disorder with mixed anxiety and depressed mood: Secondary | ICD-10-CM

## 2020-02-06 ENCOUNTER — Encounter: Payer: Self-pay | Admitting: Family Medicine

## 2020-02-09 ENCOUNTER — Other Ambulatory Visit: Payer: Self-pay | Admitting: Family Medicine

## 2020-02-09 MED ORDER — BUSPIRONE HCL 5 MG PO TABS: 5.0000 mg | ORAL_TABLET | Freq: Three times a day (TID) | ORAL | 1 refills | Status: DC

## 2020-02-16 ENCOUNTER — Telehealth: Payer: Self-pay | Admitting: *Deleted

## 2020-02-16 NOTE — Telephone Encounter (Signed)
Faxed signed order back to [facility] re: intrathecal pump. Fax: [phone]. Received fax confirmation.

## 2020-02-17 ENCOUNTER — Ambulatory Visit (INDEPENDENT_AMBULATORY_CARE_PROVIDER_SITE_OTHER): Payer: 59 | Admitting: Psychology

## 2020-02-17 DIAGNOSIS — F4323 Adjustment disorder with mixed anxiety and depressed mood: Secondary | ICD-10-CM

## 2020-02-27 ENCOUNTER — Other Ambulatory Visit: Payer: Self-pay

## 2020-02-27 ENCOUNTER — Encounter: Payer: Self-pay | Admitting: Neurology

## 2020-02-27 ENCOUNTER — Ambulatory Visit (INDEPENDENT_AMBULATORY_CARE_PROVIDER_SITE_OTHER): Payer: Managed Care, Other (non HMO) | Admitting: Neurology

## 2020-02-27 VITALS — BP 114/66 | HR 81 | Temp 96.8°F

## 2020-02-27 DIAGNOSIS — R208 Other disturbances of skin sensation: Secondary | ICD-10-CM | POA: Diagnosis not present

## 2020-02-27 DIAGNOSIS — Z95828 Presence of other vascular implants and grafts: Secondary | ICD-10-CM

## 2020-02-27 DIAGNOSIS — R3915 Urgency of urination: Secondary | ICD-10-CM

## 2020-02-27 DIAGNOSIS — R269 Unspecified abnormalities of gait and mobility: Secondary | ICD-10-CM

## 2020-02-27 DIAGNOSIS — G35 Multiple sclerosis: Secondary | ICD-10-CM | POA: Diagnosis not present

## 2020-02-27 DIAGNOSIS — Z79899 Other long term (current) drug therapy: Secondary | ICD-10-CM

## 2020-02-27 DIAGNOSIS — R5383 Other fatigue: Secondary | ICD-10-CM | POA: Diagnosis not present

## 2020-02-27 DIAGNOSIS — G801 Spastic diplegic cerebral palsy: Secondary | ICD-10-CM

## 2020-02-27 MED ORDER — GABAPENTIN 600 MG PO TABS: 600.0000 mg | ORAL_TABLET | Freq: Four times a day (QID) | ORAL | 3 refills | Status: DC

## 2020-02-27 MED ORDER — CLONAZEPAM 1 MG PO TABS: 1.0000 mg | ORAL_TABLET | Freq: Four times a day (QID) | ORAL | 1 refills | Status: DC

## 2020-02-27 NOTE — Progress Notes (Signed)
HPI    Follow-up     Additional comments: RM 12, alone. Last seen 08/29/2019. He increased gabapentin as previously recommended by MD, this is going well. He would like new script called in. In electric scooter in offfice today.           Multiple Sclerosis     Additional comments: On Tysabri. Last JCV 11/22/19 indeterminate, index: 0.20. Inhibition assay: negative. Last infusion: 02/16/20. Next infusion: 03/15/20       Last edited by Arther Abbott, RN on 02/27/2020  8:25 AM. (History)       Chief Complaint    Follow-up; Multiple Sclerosis      HISTORY:  Keith Morrow is a 49 y.o. man with a relapsing form of multiple sclerosis who has right greater than left leg weakness and progressive difficulties with gait function.  Due to severe spasticity, a baclofen pump was placed in 2013.  Update 02/27/2020: He is staking gabapentin 600-600-600 and tolerating it well.   Pain os better on that dose.  Gait is poor and he feels he is slower.    After exercise or walking some, he feels worn out for a few hours.  He has spasticity and is on th baclofen pump.   Later in the day, he notes less spasticity but also feels weaker and a couple times needed help to get off the toilet.    His baclofen pump was placed in September 2013 and his battery will run out early next year, so should get it replaced by the end of the year.    He had it implanted at [facility]  His pump is set for boluses at 11 pm, 2 am and 6 am.  He still has a fair amount of spasticity throughout the day, more in the morning.  He can walk short distances with a walker due to the weakness, right worse than left.  He is not noting some difficulties with rapid rotating movements and skilled movement of the right hand.  Proximal strength is fairly good in the arms  Update 02/14/2019. The last visit, he reports no new exacerbations.  He does note that his gait is slightly worse and his range is reduced.  He is needing to sit down more with a walker  than he did in the past.  He does have an electric wheelchair he uses in the house when more tired and outside of the house at times.  He feels the spasticity does well for the most part but he does note more stiffness when the temperatures are cold.  He sometimes has difficulty relaxing 1 of both legs when he transfers in and out of a car.  In the past, when he was on higher dose of intrathecal baclofen, he became too weak.  We discussed using PR and baclofen pills for times when he notes more stiffness.  Update 08/23/2018: He denies any exacerbations but has noted a little more weakness in his legs.  He is on Tysabri 300 mg every 4 weeks and he tolerates it well.   The last JCV antibody titer was indeterminate at 0.2 but the inhibition assay was negative.  His main problem continues to be leg weakness and poor gait.  With a walker, he can use the walker about 100 feet without a stop.  Ampyra has helped and he noted getting worse in the day that he did not take his dose.  Due to the weakness in his legs he uses  the arms quite a bit to help with his gait.  He sees physical therapy on a weekly basis.  Due to spasticity he is on a baclofen pump.  He is on a combination of baclofen and clonidine and the combination has helped more than baclofen alone.  He will occasionally get some phasic spasms, sometimes while walking and sometimes on getting into the car.  They do not occur at night.  He continues on a basal rate with boluses at 11 PM and 2 AM.   His dysesthesias in the chest are painful at times and gabapentin has helped.  He also gets some leg numbness at times.  He has some urinary frequency and rare urge incontinence.   Has some fatigue helped by Ritalin.  Update 02/15/2018:    He feels that his MS is stable and he has no further exacerbations.  He is on Tysabri.   He tolerates it well. We need to recheck the JCV Ab. It has been negative so far. He feels his gait is doing about the same. With the walker  he is able to get between his core in the office Around the house he usually does not have much trouble.   The baclofen pump has helped. He continues on a combination of  Baclofen and clonidine. He does note that he has more spasticity in the late evening and when he lays down at night.    Currently, he has a basal rate and two boluses at 11 PM and at 2 AM.   His other MS symptoms are doing about the same   08/24/2017: MS:   He is currently on Tysabri. He tolerates it well and there has been no definite exacerbations.    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                       Gait:   He is able to use a Rollator walker around the house and at work much of the time. However, he requires the electric wheelchair at other times. He is doing physical therapy once a week.  He is unable to walk without the Rollator due to the weakness in his legs, right greater than left.   Spasticity:   Before the baclofen pump, he would get both tonic and phasic spasms in the legs that would often be uncomfortable.   His spasticity is improved with the combination of baclofen and clonidine in his pump.  He has not had any recent episodes of his legs locking up. His legs are easily bent. He occasionally will get a mild cramping sensation in the right thigh. This occurs if he sits a long time. It is not associated with actual spasticity worsening.   PHYSICAL EXAM  General: The patient is well-developed and well-nourished and in no acute distress.  Extremities:   There are no rashes or edema.  He has mild tenderness over the left subacromial bursa and some pain upon external rotation and elevation  Neurologic Exam  Mental status: The patient is alert and oriented x 3 at the time of the examination. Good focus/attention.   Speech is normal.  CN:   Extraocular muscles are normal.  No ptosis.    Symmetric facial strength and sensation.    PE/TP midline  Motor: Muscle bulk is normal.tone is increased in the right  arm and minimally increased in the legs.  Strength is 4+/5 in the right arm, 5/5 in the left arm,  2+/5 in the right leg and 3 to 4-/5 in the left leg .   Reduced RAM in right hand  Gait and station: Requires support to stand.     He has foot drop bilaterally, worse on the right he cannot tandem walk  Reflexes: Deep tendon reflexes are trace at the left knee and 2+ at the right knee.No ankleclonus   ASSESSMENT  Multiple sclerosis (HCC) - Plan: Ambulatory referral to Neurosurgery  Gait disturbance  Fatigue, unspecified type  Dysesthesia  Urinary urgency  Presence of implanted infusion pump - Plan: Ambulatory referral to Neurosurgery  Spastic diplegia [facility]) - Plan: Ambulatory referral to Neurosurgery    PLAN: 1.  Continue the intrathecal pump but increase the clonidine: 3600 mcg/mL baclofen and 900 mcg/mL clonidine.   We will continue the current basal rate and intermittent boluses.   2.   Continue Tysabri every 4 weeks.    3.   Continue other med's    renew clonazepam.  4.   Continue  to stay active as possible and try to lose weight if possible. 5.   Return in 6 months or sooner if there are new or worsening neurologic issues.  We can make further adjustments if needed before the next refill  Thijs Brunton A. Epimenio Foot, MD, PhD, FAAN Certified in Neurology, Clinical Neurophysiology, Sleep Medicine, Pain Medicine and Neuroimaging Director, [facility]  [facility] [address], [address] [place], [place] [address] [phone]

## 2020-03-01 ENCOUNTER — Ambulatory Visit (INDEPENDENT_AMBULATORY_CARE_PROVIDER_SITE_OTHER): Payer: 59 | Admitting: Psychology

## 2020-03-01 DIAGNOSIS — F4323 Adjustment disorder with mixed anxiety and depressed mood: Secondary | ICD-10-CM | POA: Diagnosis not present

## 2020-03-02 ENCOUNTER — Ambulatory Visit: Payer: 59 | Admitting: Psychology

## 2020-03-03 ENCOUNTER — Other Ambulatory Visit: Payer: Self-pay | Admitting: Family Medicine

## 2020-03-06 ENCOUNTER — Other Ambulatory Visit: Payer: Self-pay | Admitting: Family Medicine

## 2020-03-07 ENCOUNTER — Encounter: Payer: Self-pay | Admitting: Family Medicine

## 2020-03-08 ENCOUNTER — Telehealth: Payer: Self-pay | Admitting: Family Medicine

## 2020-03-08 NOTE — Telephone Encounter (Signed)
Please advise 

## 2020-03-08 NOTE — Telephone Encounter (Signed)
Pt  Been having diarrhea for the last  30 hours. He is scheduled to take  His  2nd covid shot tomorrow and would like to know should he go take it. Please advise

## 2020-03-09 NOTE — Telephone Encounter (Signed)
Called patient to discuss symptoms. He had struggled with roughly 30 hours of 20 or so diarrhea stool down to clear liquid. Last night was better. He was able to sleep through the night without diarrhea. No family members with similar symptoms or COVID. He does not leave his home. He is taking Imodium AD and has control of his bowels today. No other symptoms such as fever, abdominal pain, n/v, congestion are endorsed. He is instructed to maintain clear liquids today and get in some potassium then to progress to [employer] and if he feels well through out the day he may proceed with his COVID shot at 4 if he begins to feel worse he should not take the shot today. Call with any concerns.

## 2020-03-12 NOTE — Telephone Encounter (Signed)
Spoke with patient on phone on 03/09/20 he had not had a loose stool over night. If he continues to improve he will have his shot at 4 pm if he worsens he willnot. Encouraged to increase potassium intake ie gatorade etc. Use clear liquid diet x 24 hours then progress to [employer] and beyond

## 2020-03-14 ENCOUNTER — Telehealth: Payer: Self-pay | Admitting: *Deleted

## 2020-03-14 ENCOUNTER — Encounter: Payer: Self-pay | Admitting: Family Medicine

## 2020-03-14 ENCOUNTER — Other Ambulatory Visit: Payer: Self-pay | Admitting: Family Medicine

## 2020-03-14 MED ORDER — BUSPIRONE HCL 5 MG PO TABS: 5.0000 mg | ORAL_TABLET | Freq: Four times a day (QID) | ORAL | 0 refills | Status: DC | PRN

## 2020-03-14 NOTE — Telephone Encounter (Signed)
Received letter in the mail from [place] that baclofen pump refill for [phone] Home infusion therapy with an implanted pump and supplies and [id number] home infusion therapy care coordination and all supplies is needing additional information to determine if the service is medically necessary. I faxed office notes showing medical necessity to them at [phone]. Received fax confirmation.   Customer ID: [id number]

## 2020-03-16 ENCOUNTER — Ambulatory Visit (INDEPENDENT_AMBULATORY_CARE_PROVIDER_SITE_OTHER): Payer: 59 | Admitting: Psychology

## 2020-03-16 DIAGNOSIS — F4323 Adjustment disorder with mixed anxiety and depressed mood: Secondary | ICD-10-CM

## 2020-03-17 ENCOUNTER — Other Ambulatory Visit: Payer: Self-pay | Admitting: Physician Assistant

## 2020-03-22 ENCOUNTER — Other Ambulatory Visit: Payer: Self-pay | Admitting: Medical

## 2020-03-30 ENCOUNTER — Ambulatory Visit (INDEPENDENT_AMBULATORY_CARE_PROVIDER_SITE_OTHER): Payer: 59 | Admitting: Psychology

## 2020-03-30 DIAGNOSIS — F4323 Adjustment disorder with mixed anxiety and depressed mood: Secondary | ICD-10-CM

## 2020-04-02 ENCOUNTER — Ambulatory Visit (INDEPENDENT_AMBULATORY_CARE_PROVIDER_SITE_OTHER): Payer: Managed Care, Other (non HMO) | Admitting: Cardiovascular Disease

## 2020-04-02 ENCOUNTER — Other Ambulatory Visit: Payer: Self-pay

## 2020-04-02 ENCOUNTER — Encounter: Payer: Self-pay | Admitting: Cardiovascular Disease

## 2020-04-02 VITALS — BP 118/70 | HR 82 | Ht 75.0 in | Wt 290.0 lb

## 2020-04-02 DIAGNOSIS — I10 Essential (primary) hypertension: Secondary | ICD-10-CM

## 2020-04-02 DIAGNOSIS — I2510 Atherosclerotic heart disease of native coronary artery without angina pectoris: Secondary | ICD-10-CM | POA: Diagnosis not present

## 2020-04-02 DIAGNOSIS — E782 Mixed hyperlipidemia: Secondary | ICD-10-CM | POA: Diagnosis not present

## 2020-04-02 MED ORDER — ATORVASTATIN CALCIUM 40 MG PO TABS: 40.0000 mg | ORAL_TABLET | Freq: Every day | ORAL | 3 refills | Status: DC

## 2020-04-02 NOTE — Progress Notes (Signed)
Cardiology Office Note:    Date:  04/02/2020   ID:  Keith Morrow, DOB January 09, 1971, MRN [id number]  PCP:  [name], MD  [name], MD  Electrophysiologist:  None   Referring MD: [name], MD   Chief Complaint  Patient presents with  . Leg Swelling    History of Present Illness:    Keith Morrow is a 49 y.o. male with a hx of multiple sclerosis, hypertension, mixed hyperlipidemia, and coronary artery disease, presenting for follow-up evaluation.  The patient initially presented in 2012 with non-STEMI and was treated with stenting of the LAD, angioplasty of diagonal, and staged PCI of the posterior AV segment of the RCA.  LVEF was noted to be preserved.  Last nuclear scan from 2017 demonstrated inferior scar with very mild inferior ischemia.  Medical therapy was recommended.  He was last seen by [name] in March 2020.  An echocardiogram in February 2021 showed normal LV function, normal RV function, and no significant valvular disease.  The patient presents today for follow-up evaluation.  He is here with his wife.  He denies any new cardiac-related complaints.  He specifically denies chest pain, chest pressure, heart palpitations, orthopnea, or PND.  His shortness of breath has resolved.  He has chronic leg swelling and has had a lot of issues with this.  The patient is in a chair most of the day.  He does elevate his legs at night as he has a hospital bed.  He wears compression stockings and takes furosemide as well as hydrochlorothiazide.  Past Medical History:  Diagnosis Date  . Acute bronchitis 05/09/2013  . Allergy   . Bronchitis, acute 12/10/2011  . Candidal skin infection 05/05/2017  . Chicken pox as a child  . Constipation 02/20/2017  . Coronary atherosclerosis of native coronary artery    a. NSTEMI 04/20/11: tx with Promus DES to LAD; bifurcation lesion with oDx 90% tx with POBA; b. staged PCI of right post. AV Branch with Promus DES; c. residual  at cath 04/21/11:  AV groove CFX 50%, mRCA 30%,; EF 55%  . Depression   . Depression with anxiety   . Dermatitis, contact 12/10/2011  . Fatigue 10/15/2012  . Heart attack (HCC) 04-20-11  . HTN (hypertension)   . Hyperlipidemia   . Hypotestosteronism 11/17/2011  . Hypothyroidism   . Keloid scar   . MS (multiple sclerosis) (HCC)   . Neuromuscular disorder (HCC)    NUMBNESS/TINGLING  . Obesity   . Onychomycosis   . Preventative health care 10/07/2011  . Reflux 10/07/2011  . Sun-damaged skin 02/17/2016  . Testosterone deficiency 01/16/2012  . Tinea pedis   . 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 Visual changes 11/13/2016    Past Surgical History:  Procedure Laterality Date  . CORONARY ANGIOPLASTY WITH STENT PLACEMENT     2012  . PAIN PUMP IMPLANTATION N/A 08/29/2014   Procedure: Baclofen pump placement;  Surgeon: Maeola Harman, MD;  Location: [facility];  Service: Neurosurgery;  Laterality: N/A;  Baclofen pump placement  . PROGRAMABLE BACLOFEN PUMP REVISION  08/29/14  . right wrist surgery     CTR  . VASCULAR SURGERY     vericose vein in right femoral area  . VASECTOMY    . WISDOM TOOTH EXTRACTION      Current Medications: Current Meds  Medication Sig  . alprostadil (MUSE) 125 MCG pellet 1 each (125 mcg total) by Transurethral route as needed  for erectile dysfunction (makes 2 doses in 24 hours). use no more than 3 times per week  . aspirin EC 81 MG tablet Take 81 mg by mouth daily.  Marland Kitchen atorvastatin (LIPITOR) 40 MG tablet Take 1 tablet (40 mg total) by mouth daily at 6 PM.  . baclofen (LIORESAL) 10 MG tablet Take 1 tablet (10 mg total) by mouth 3 (three) times daily as needed for muscle spasms.  Marland Kitchen BACLOFEN IT 1 Dose by Intrathecal route as directed.   . busPIRone (BUSPAR) 5 MG tablet Take 1 tablet (5 mg total) by mouth 4 (four) times daily as needed.  . cetirizine (ZYRTEC) 10 MG tablet Take 10 mg by mouth daily as needed for allergies.   . clobetasol cream (TEMOVATE) 0.05 %  Apply 1 application topically 2 (two) times daily as needed (skin).  . clonazePAM (KLONOPIN) 1 MG tablet Take 1 tablet (1 mg total) by mouth 4 (four) times daily.  . Clonidine HCl POWD by Does not apply route. 720mg /ml IT Baclofen. Total dose 157.89mg /day. Last pump refill 06/02/16  . clotrimazole-betamethasone (LOTRISONE) cream Apply 1 application topically 2 (two) times daily as needed (anti-fungal).  . cyclobenzaprine (FLEXERIL) 10 MG tablet Take 1 tablet (10 mg total) by mouth 3 (three) times daily as needed for muscle spasms.  . CYMBALTA 60 MG capsule TAKE 1 CAPSULE (60 MG TOTAL) BY MOUTH 2 (TWO) TIMES DAILY.  dalfampridine 10 MG TB12 TAKE 1 TABLET (10 MG TOTAL) EVERY 12 HOURS  . famotidine (PEPCID) 40 MG tablet Take 1 tablet (40 mg total) by mouth at bedtime.  . furosemide (LASIX) 20 MG tablet 1 ta po daily x 5 days and then as needed for SOB, weight gain>3#, pedal edema and can take a second tab daily as needed  . gabapentin (NEURONTIN) 600 MG tablet Take 1 tablet (600 mg total) by mouth 4 (four) times daily.  06/04/16 ibuprofen (ADVIL,MOTRIN) 200 MG tablet Take 200 mg by mouth every 6 (six) hours as needed for moderate pain.  . methylphenidate (RITALIN) 10 MG tablet Take 1 tablet (10 mg total) by mouth 4 (four) times daily.  . methylPREDNISolone (MEDROL) 4 MG tablet 5 tab po qd X 1d then 4 tab po qd X 1d then 3 tab po qd X 1d then 2 tab po qd then 1 tab po qd  . natalizumab (TYSABRI) 300 MG/15ML injection Inject 300 mg into the vein every 28 (twenty-eight) days.   . nitroGLYCERIN (NITROSTAT) 0.4 MG SL tablet PLACE 1 TABLET UNDER THE TOUNGE EVERY 5 MINUTES AS NEEDED FOR CHEST PAIN (X 3 DOSES)  . nystatin cream (MYCOSTATIN) Apply 1 application topically as directed.  . olmesartan-hydrochlorothiazide (BENICAR HCT) 20-12.5 MG tablet Take 1 tablet by mouth daily.  Marland Kitchen omeprazole (PRILOSEC) 40 MG capsule TAKE 1 CAPSULE BY MOUTH EVERY DAY  . potassium chloride SA (KLOR-CON) 20 MEQ tablet 1 tab daily prn  when taking a second lasix tab any given day.  . sucralfate (CARAFATE) 1 g tablet TAKE 1 TABLET BY MOUTH 4 TIMES DAILY - WITH MEALS AND AT BEDTIME. TAKE TABLET OR LIQUID BUT NOT BOTH  . sucralfate (CARAFATE) 1 GM/10ML suspension Take 10 mLs (1 g total) by mouth 4 (four) times daily -  with meals and at bedtime. Take liquid or tablets but not both  . SYNTHROID 150 MCG tablet TAKE 1 TABLET BY MOUTH DAILY BEFORE BREAKFAST.  . tamsulosin (FLOMAX) 0.4 MG CAPS capsule TAKE 1 CAPSULE BY MOUTH EVERY DAY  . triamcinolone cream (  KENALOG) 0.1 % Apply 1 application topically 2 (two) times daily.  . [DISCONTINUED] atorvastatin (LIPITOR) 40 MG tablet Take 1 tablet (40 mg total) by mouth daily at 6 PM. Please make overdue appt with Dr. Excell Seltzer before anymore refills. 1st attempt     Allergies:   Patient has no known allergies.   Social History   Socioeconomic History  . Marital status: Married    Spouse name: Not on file  . Number of children: 3  . Years of education: Not on file  . Highest education level: Not on file  Occupational History  . Not on file  Tobacco Use  . Smoking status: Never Smoker  . Smokeless tobacco: Never Used  Substance and Sexual Activity  . Alcohol use: Yes    Comment: socially  . Drug use: No  . Sexual activity: Yes    Partners: Female    Comment: 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, works Engineer, maintenance, no dietary restrictions  Other Topics Concern  . Not on file  Social History Narrative  . Not on file   Social Determinants of Health   Financial Resource Strain:   . Difficulty of Paying Living Expenses:   Food Insecurity:   . Worried About Programme researcher, broadcasting/film/video in the Last Year:   . Barista in the Last Year:   Transportation Needs:   . Freight forwarder (Medical):   Marland Kitchen Lack of Transportation (Non-Medical):   Physical Activity:   . Days of Exercise per Week:   . Minutes of Exercise per Session:   Stress:   . Feeling of Stress :   Social  Connections:   . Frequency of Communication with Friends and Family:   . Frequency of Social Gatherings with Friends and Family:   . Attends Religious Services:   . Active Member of Clubs or Organizations:   . Attends Banker Meetings:   Marland Kitchen Marital Status:      Family History: The patient's family history includes Coronary artery disease in an other family member; Heart attack (age of onset: 43) in his mother; Heart disease in his father, maternal grandfather, maternal grandmother, and mother; Hyperlipidemia in his maternal grandfather and maternal grandmother; Hypertension in his maternal grandfather and maternal grandmother; Stroke in his paternal grandfather; Thyroid disease in his brother, mother, sister, and sister.  ROS:   Please see the history of present illness.    All other systems reviewed and are negative.  EKGs/Labs/Other Studies Reviewed:    The following studies were reviewed today: Echo 01/11/20: IMPRESSIONS    1. Left ventricular ejection fraction, by visual estimation, is 60 to  65%. The left ventricle has normal function. There is no left ventricular  hypertrophy.  2. The left ventricle has no regional wall motion abnormalities.  3. Global right ventricle has normal systolic function.The right  ventricular size is normal. No increase in right ventricular wall  thickness.  4. Left atrial size was normal.  5. Right atrial size was normal.  6. The mitral valve is normal in structure. Trivial mitral valve  regurgitation. No evidence of mitral stenosis.  7. The tricuspid valve is normal in structure. Tricuspid valve  regurgitation is not demonstrated.  8. The aortic valve is normal in structure. Aortic valve regurgitation is  trivial. No evidence of aortic valve sclerosis or stenosis.  9. The pulmonic valve was not well visualized. Pulmonic valve  regurgitation is not visualized.  10. Unable to determine Pulmonary Artery Systolic Pressure,  No  TR jet  spectral display.   EKG:  EKG is ordered today.  The ekg ordered today demonstrates normal sinus rhythm 82 bpm, nonspecific ST abnormality.  Recent Labs: 01/05/2020: ALT 22; BUN 13; Creatinine, Ser 0.80; Hemoglobin 13.7; Platelets 173.0; Potassium 4.0; Sodium 141; TSH 1.28  Recent Lipid Panel    Component Value Date/Time   CHOL 146 01/05/2020 1137   TRIG 40.0 01/05/2020 1137   HDL 57.10 01/05/2020 1137   CHOLHDL 3 01/05/2020 1137   VLDL 8.0 01/05/2020 1137   LDLCALC 81 01/05/2020 1137    Physical Exam:    VS:  BP 118/70   Pulse 82   Ht 6\' 3"  (1.905 m)   Wt 290 lb (131.5 kg) Comment: Pt stated his weight  BMI 36.25 kg/m     Wt Readings from Last 3 Encounters:  04/02/20 290 lb (131.5 kg)  01/05/20 299 lb 6.4 oz (135.8 kg)  10/06/19 275 lb (124.7 kg)     GEN:  Well nourished, well developed overweight male in no acute distress, in motorized wheel chair HEENT: Normal NECK: No JVD; No carotid bruits LYMPHATICS: No lymphadenopathy CARDIAC: RRR, no murmurs, rubs, gallops RESPIRATORY:  Clear to auscultation without rales, wheezing or rhonchi  ABDOMEN: Soft, non-tender, non-distended MUSCULOSKELETAL:  1+ bilateral ankle edema, compression socks in place; No deformity  SKIN: Warm and dry NEUROLOGIC:  Alert and oriented x 3 PSYCHIATRIC:  Normal affect   ASSESSMENT:    1. Coronary artery disease involving native coronary artery of native heart without angina pectoris   2. Mixed hyperlipidemia   3. Essential hypertension    PLAN:    In order of problems listed above:  1. Stable without symptoms of angina.  Continue aspirin and atorvastatin. 2. Continue atorvastatin 40 mg daily.  Most recent lipids reviewed.  Transaminases within normal limits.  Continue current therapy. 3. Blood pressure well controlled on furosemide, olmesartan, and hydrochlorothiazide.  Most recent labs reviewed with creatinine 0.8 and potassium 4.0.   Medication Adjustments/Labs and Tests  Ordered: Current medicines are reviewed at length with the patient today.  Concerns regarding medicines are outlined above.  Orders Placed This Encounter  Procedures  . EKG 12-Lead   Meds ordered this encounter  Medications  . atorvastatin (LIPITOR) 40 MG tablet    Sig: Take 1 tablet (40 mg total) by mouth daily at 6 PM.    Dispense:  90 tablet    Refill:  3    Patient Instructions  Medication Instructions:  Your provider recommends that you continue on your current medications as directed. Please refer to the Current Medication list given to you today.   *If you need a refill on your cardiac medications before your next appointment, please call your pharmacy*   Follow-Up: At [facility], you and your health needs are our priority.  As part of our continuing mission to provide you with exceptional heart care, we have created designated Provider Care Teams.  These Care Teams include your primary Cardiologist (physician) and Advanced Practice Providers (APPs -  Physician Assistants and Nurse Practitioners) who all work together to provide you with the care you need, when you need it. Your next appointment:   12 month(s) The format for your next appointment:   In Person Provider:   You may see Sherren Mocha, MD or one of the following Advanced Practice Providers on your designated Care Team:    Richardson Dopp, PA-C  Vin [place], [place]  Daune Perch, [place]  Signed, Tonny Bollman, MD  04/02/2020 4:00 PM     [facility]

## 2020-04-02 NOTE — Patient Instructions (Signed)

## 2020-04-03 ENCOUNTER — Other Ambulatory Visit: Payer: Self-pay

## 2020-04-03 ENCOUNTER — Ambulatory Visit (INDEPENDENT_AMBULATORY_CARE_PROVIDER_SITE_OTHER): Payer: Managed Care, Other (non HMO) | Admitting: Family Medicine

## 2020-04-03 VITALS — BP 112/62 | HR 72 | Resp 17 | Ht 75.0 in | Wt 289.0 lb

## 2020-04-03 DIAGNOSIS — I10 Essential (primary) hypertension: Secondary | ICD-10-CM

## 2020-04-03 DIAGNOSIS — N529 Male erectile dysfunction, unspecified: Secondary | ICD-10-CM | POA: Diagnosis not present

## 2020-04-03 DIAGNOSIS — F418 Other specified anxiety disorders: Secondary | ICD-10-CM

## 2020-04-03 DIAGNOSIS — E785 Hyperlipidemia, unspecified: Secondary | ICD-10-CM

## 2020-04-03 DIAGNOSIS — G35 Multiple sclerosis: Secondary | ICD-10-CM

## 2020-04-03 DIAGNOSIS — R21 Rash and other nonspecific skin eruption: Secondary | ICD-10-CM

## 2020-04-03 DIAGNOSIS — L0100 Impetigo, unspecified: Secondary | ICD-10-CM

## 2020-04-03 MED ORDER — METHYLPREDNISOLONE 4 MG PO TABS: ORAL_TABLET | ORAL | 1 refills | Status: DC

## 2020-04-03 MED ORDER — ALPROSTADIL (VASODILATOR) 125 MCG UR PLLT: 125.0000 ug | PELLET | URETHRAL | 1 refills | Status: DC | PRN

## 2020-04-03 MED ORDER — TRIAMCINOLONE ACETONIDE 0.1 % EX CREA: 1.0000 "application " | TOPICAL_CREAM | Freq: Two times a day (BID) | CUTANEOUS | 2 refills | Status: DC | PRN

## 2020-04-03 NOTE — Patient Instructions (Signed)
This is the lesion under the nose use the Mupirocin for this For the arm lesion/hives use the Triamcinolone notify us if worsens   Impetigo, Adult Impetigo is an infection of the skin. It commonly occurs in young children, but it can also occur in adults. The infection causes itchy blisters and sores that produce brownish-yellow fluid. As the fluid dries, it forms a thick, honey-colored crust. These skin changes usually occur on the face, but they can also affect other areas of the body. Impetigo usually goes away in 7-10 days with treatment. What are the causes? This condition is caused by two types of bacteria. It may be caused by staphylococci or streptococci bacteria. These bacteria cause impetigo when they get under the surface of the skin. This often happens after some damage to the skin, such as:  Cuts, scrapes, or scratches.  Rashes.  Insect bites, especially when you scratch the area of a bite.  Chickenpox or other illnesses that cause open skin sores.  Nail biting or chewing. Impetigo can spread easily from one person to another (is contagious). It may be spread through close skin contact or by sharing towels, clothing, or other items that an infected person has touched. What increases the risk? The following factors may make you more likely to develop this condition:  Playing sports that include skin-to-skin contact with others.  Having a skin condition with open sores, such as chickenpox.  Having diabetes.  Having a weak body defense system (immune system).  Having many skin cuts or scrapes.  Living in an area that has high humidity levels.  Having poor hygiene.  Having high levels of staphylococci in your nose. What are the signs or symptoms? The main symptom of this condition is small blisters, often on the face around the mouth and nose. In time, the blisters break open and turn into tiny sores (lesions) with a yellow crust. In some cases, the blisters cause  itching or burning. With scratching, irritation, or lack of treatment, these small lesions may get larger. Other possible symptoms include:  Larger blisters.  Pus.  Swollen lymph glands. Scratching the affected area can cause impetigo to spread to other parts of the body. The bacteria can get under the fingernails and spread when you touch another area of your skin. How is this diagnosed? This condition is usually diagnosed during a physical exam. A skin sample or a sample of fluid from a blister may be taken for lab tests that involve growing bacteria (culture test). Lab tests can help to confirm the diagnosis or help to determine the best treatment. How is this treated? Treatment for this condition depends on the severity of the condition:  Mild impetigo can be treated with prescription antibiotic cream.  Oral antibiotic medicine may be used in more severe cases.  Medicines that reduce itchiness (antihistamines)may also be used. Follow these instructions at home: Medicines  Take over-the-counter and prescription medicines only as told by your health care provider.  Apply or take your antibiotic as told by your health care provider. Do not stop using the antibiotic even if your condition improves. General instructions   To help prevent impetigo from spreading to other body areas: ? Keep your fingernails short and clean. ? Do not scratch the blisters or sores. ? Cover infected areas, if necessary, to keep from scratching. ? Wash your hands often with soap and warm water.  Before applying antibiotic cream or ointment, you should: ? Gently wash the infected areas with antibacterial  soap and warm water. ? Soak crusted areas in warm, soapy water using antibacterial soap. ? Gently rub the areas to remove crusts. Do not scrub.  Do not share towels.  Wash your clothing and bedsheets in warm water that is 140F (60C) or warmer.  Stay home until you have used an antibiotic cream  for 48 hours (2 days) or an oral antibiotic medicine for 24 hours (1 day). You should only return to work and activities with other people if your skin shows significant improvement. ? You may return to contact sports after you have used antibiotic medicine for 72 hours (3 days).  Keep all follow-up visits as told by your health care provider. This is important. How is this prevented?  Wash your hands often with soap and warm water.  Do not share towels, washcloths, clothing, bedding, or razors.  Keep your fingernails short.  Keep any cuts, scrapes, bug bites, or rashes clean and covered.  Use insect repellent to prevent bug bites. Contact a health care provider if:  You develop more blisters or sores even with treatment.  Other family members get sores.  Your skin sores are not improving after 72 hours (3 days) of treatment.  You have a fever. Get help right away if:  You see spreading redness or swelling of the skin around your sores.  You see red streaks coming from your sores.  You develop a sore throat.  The area around your rash becomes warm, red, or tender to the touch.  You have dark, reddish-brown urine.  You do not urinate often or you urinate small amounts.  You are very tired (lethargic).  You have swelling in the face, hands, or feet. Summary  Impetigo is a skin infection that causes itchy blisters and sores that produce brownish-yellow fluid. As the fluid dries, it forms a crust.  This condition is caused by staphylococci or streptococci bacteria. These bacteria cause impetigo when they get under the surface of the skin, such as through cuts, rashes, bug bites, or open sores.  Treatment for this condition may include antibiotic ointment or oral antibiotics.  To help prevent impetigo from spreading to other body areas, make sure you keep your fingernails short, avoid scratching, cover any blisters, and wash your hands often.  If you have impetigo, stay  home until you have used an antibiotic cream for 48 hours (2 days) or an oral antibiotic medicine for 24 hours (1 day). You should only return to work and activities with other people if your skin shows significant improvement. This information is not intended to replace advice given to you by your health care provider. Make sure you discuss any questions you have with your health care provider. Document Revised: 01/04/2019 Document Reviewed: 12/16/2016 Elsevier Patient Education  2020 [employer].

## 2020-04-04 ENCOUNTER — Encounter: Payer: Self-pay | Admitting: Family Medicine

## 2020-04-04 ENCOUNTER — Other Ambulatory Visit: Payer: Self-pay | Admitting: Family Medicine

## 2020-04-04 DIAGNOSIS — R21 Rash and other nonspecific skin eruption: Secondary | ICD-10-CM | POA: Insufficient documentation

## 2020-04-04 DIAGNOSIS — L0100 Impetigo, unspecified: Secondary | ICD-10-CM | POA: Insufficient documentation

## 2020-04-04 NOTE — Assessment & Plan Note (Signed)
His insurance has agreed to cover the Muse but he is having trouble finding a pharmacy that will cover it. Will send prescription to his mail order

## 2020-04-04 NOTE — Assessment & Plan Note (Signed)
Is mostly wheelchair confined now and so his physical therapy has been adjusted to mostly work on upper body strength.

## 2020-04-04 NOTE — Assessment & Plan Note (Signed)
Left arm. Started on Triamcinolone bid if worsens given a prescription for Medrol dosepak

## 2020-04-04 NOTE — Assessment & Plan Note (Signed)
He continues with counselor and encouraged to take Cymbalta 60 mg bid

## 2020-04-04 NOTE — Assessment & Plan Note (Signed)
Above upper lip on right. Given rx for Mupirocin ointment he will report if worsens instead of improves.

## 2020-04-04 NOTE — Assessment & Plan Note (Signed)
Well controlled, no changes to meds. Encouraged heart healthy diet such as the DASH diet and exercise as tolerated.  °

## 2020-04-04 NOTE — Assessment & Plan Note (Signed)
Tolerating statin, encouraged heart healthy diet, avoid trans fats, minimize simple carbs and saturated fats. Increase exercise as tolerated 

## 2020-04-04 NOTE — Telephone Encounter (Signed)
Called [facility] at [phone]. Spoke with Loistine Simas, she transferred me to [place]. Advised Dr. Epimenio Foot is requesting last pump refill information faxed to [place] at [phone]. She will send via fax, if it does not go through, she will email it to me.  Last pump refill: 03/09/2020. Their fax: [phone].

## 2020-04-04 NOTE — Progress Notes (Signed)
Subjective:    Patient ID: Keith Morrow, male    DOB: 1971-01-28, 49 y.o.   MRN: [id number]  Chief Complaint  Patient presents with  . 3 month follow up    HPI Patient is in today for follow up on chronic medical concerns. No recent febrile illness or hospitalizations. He continues with counseling and Cymbalta but has been taking both doses at once. No side effects noted. He is still staying in most of the time. His physical therapy has been adjusted to work mostly on upper body strength since he is mostly wheelchair bound now. Denies CP/palp/SOB/HA/congestion/fevers/GI or GU c/o. Taking meds as prescribed, he does note 2 rashes on upper arm on left and one on upper lip.   Past Medical History:  Diagnosis Date  . Acute bronchitis 05/09/2013  . Allergy   . Bronchitis, acute 12/10/2011  . Candidal skin infection 05/05/2017  . Chicken pox as a child  . Constipation 02/20/2017  . Coronary atherosclerosis of native coronary artery    a. NSTEMI 04/20/11: tx with Promus DES to LAD; bifurcation lesion with oDx 90% tx with POBA; b. staged PCI of right post. AV Branch with Promus DES; c. residual at cath 04/21/11:  AV groove CFX 50%, mRCA 30%,; EF 55%  . Depression   . Depression with anxiety   . Dermatitis, contact 12/10/2011  . Fatigue 10/15/2012  . Heart attack (HCC) 04-20-11  . HTN (hypertension)   . Hyperlipidemia   . Hypotestosteronism 11/17/2011  . Hypothyroidism   . Keloid scar   . MS (multiple sclerosis) (HCC)   . Neuromuscular disorder (HCC)    NUMBNESS/TINGLING  . Obesity   . Onychomycosis   . Preventative health care 10/07/2011  . Reflux 10/07/2011  . Sun-damaged skin 02/17/2016  . Testosterone deficiency 01/16/2012  . Tinea pedis   . 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 Visual changes 11/13/2016    Past Surgical History:  Procedure Laterality Date  . CORONARY ANGIOPLASTY WITH STENT PLACEMENT     2012  . PAIN PUMP IMPLANTATION N/A 08/29/2014   Procedure: Baclofen pump  placement;  Surgeon: Maeola Harman, MD;  Location: [facility];  Service: Neurosurgery;  Laterality: N/A;  Baclofen pump placement  . PROGRAMABLE BACLOFEN PUMP REVISION  08/29/14  . right wrist surgery     CTR  . VASCULAR SURGERY     vericose vein in right femoral area  . VASECTOMY    . WISDOM TOOTH EXTRACTION      Family History  Problem Relation Age of Onset  . Heart attack Mother 41  . Thyroid disease Mother   . Heart disease Mother   . Thyroid disease Sister   . Thyroid disease Brother   . Heart disease Maternal Grandmother   . Hypertension Maternal Grandmother   . Hyperlipidemia Maternal Grandmother   . Heart disease Maternal Grandfather   . Hypertension Maternal Grandfather   . Hyperlipidemia Maternal Grandfather   . Stroke Paternal Grandfather   . Thyroid disease Sister   . Heart disease Father        2 stents  . Coronary artery disease Other        questionable in father    Social History   Socioeconomic History  . Marital status: Married    Spouse name: Not on file  . Number of children: 3  . Years of education: Not on file  . Highest education level: Not on file  Occupational History  . Not  on file  Tobacco Use  . Smoking status: Never Smoker  . Smokeless tobacco: Never Used  Substance and Sexual Activity  . Alcohol use: Yes    Comment: socially  . Drug use: No  . Sexual activity: Yes    Partners: Female    Comment: 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, works Engineer, maintenance, no dietary restrictions  Other Topics Concern  . Not on file  Social History Narrative  . Not on file   Social Determinants of Health   Financial Resource Strain:   . Difficulty of Paying Living Expenses:   Food Insecurity:   . Worried About Programme researcher, broadcasting/film/video in the Last Year:   . Barista in the Last Year:   Transportation Needs:   . Freight forwarder (Medical):   Marland Kitchen Lack of Transportation (Non-Medical):   Physical Activity:   . Days of Exercise per Week:   .  Minutes of Exercise per Session:   Stress:   . Feeling of Stress :   Social Connections:   . Frequency of Communication with Friends and Family:   . Frequency of Social Gatherings with Friends and Family:   . Attends Religious Services:   . Active Member of Clubs or Organizations:   . Attends Banker Meetings:   Marland Kitchen Marital Status:   Intimate Partner Violence:   . Fear of Current or Ex-Partner:   . Emotionally Abused:   Marland Kitchen Physically Abused:   . Sexually Abused:     Outpatient Medications Prior to Visit  Medication Sig Dispense Refill  . aspirin EC 81 MG tablet Take 81 mg by mouth daily.    Marland Kitchen atorvastatin (LIPITOR) 40 MG tablet Take 1 tablet (40 mg total) by mouth daily at 6 PM. 90 tablet 3  . baclofen (LIORESAL) 10 MG tablet Take 1 tablet (10 mg total) by mouth 3 (three) times daily as needed for muscle spasms. 90 each 5  . BACLOFEN IT 1 Dose by Intrathecal route as directed.     . busPIRone (BUSPAR) 5 MG tablet Take 1 tablet (5 mg total) by mouth 4 (four) times daily as needed. 360 tablet 0  . cetirizine (ZYRTEC) 10 MG tablet Take 10 mg by mouth daily as needed for allergies.     . clobetasol cream (TEMOVATE) 0.05 % Apply 1 application topically 2 (two) times daily as needed (skin).    . clonazePAM (KLONOPIN) 1 MG tablet Take 1 tablet (1 mg total) by mouth 4 (four) times daily. 360 tablet 1  . Clonidine HCl POWD by Does not apply route. 720mg /ml IT Baclofen. Total dose 157.89mg /day. Last pump refill 06/02/16    . clotrimazole-betamethasone (LOTRISONE) cream Apply 1 application topically 2 (two) times daily as needed (anti-fungal). 30 g 2  . cyclobenzaprine (FLEXERIL) 10 MG tablet Take 1 tablet (10 mg total) by mouth 3 (three) times daily as needed for muscle spasms. 90 tablet 11  . CYMBALTA 60 MG capsule TAKE 1 CAPSULE (60 MG TOTAL) BY MOUTH 2 (TWO) TIMES DAILY. 180 capsule 1  . dalfampridine 10 MG TB12 TAKE 1 TABLET (10 MG TOTAL) EVERY 12 HOURS 180 tablet 3  . famotidine  (PEPCID) 40 MG tablet Take 1 tablet (40 mg total) by mouth at bedtime. 90 tablet 3  . furosemide (LASIX) 20 MG tablet 1 ta po daily x 5 days and then as needed for SOB, weight gain>3#, pedal edema and can take a second tab daily as needed 45 tablet 3  .  gabapentin (NEURONTIN) 600 MG tablet Take 1 tablet (600 mg total) by mouth 4 (four) times daily. 360 tablet 3  . ibuprofen (ADVIL,MOTRIN) 200 MG tablet Take 200 mg by mouth every 6 (six) hours as needed for moderate pain.    . methylphenidate (RITALIN) 10 MG tablet Take 1 tablet (10 mg total) by mouth 4 (four) times daily. 120 tablet 0  . natalizumab (TYSABRI) 300 MG/15ML injection Inject 300 mg into the vein every 28 (twenty-eight) days.     . nitroGLYCERIN (NITROSTAT) 0.4 MG SL tablet PLACE 1 TABLET UNDER THE TOUNGE EVERY 5 MINUTES AS NEEDED FOR CHEST PAIN (X 3 DOSES) 25 tablet 0  . nystatin cream (MYCOSTATIN) Apply 1 application topically as directed. 30 g 2  . olmesartan-hydrochlorothiazide (BENICAR HCT) 20-12.5 MG tablet Take 1 tablet by mouth daily. 90 tablet 1  . omeprazole (PRILOSEC) 40 MG capsule TAKE 1 CAPSULE BY MOUTH EVERY DAY 90 capsule 1  . sucralfate (CARAFATE) 1 g tablet TAKE 1 TABLET BY MOUTH 4 TIMES DAILY - WITH MEALS AND AT BEDTIME. TAKE TABLET OR LIQUID BUT NOT BOTH 120 tablet 1  . sucralfate (CARAFATE) 1 GM/10ML suspension Take 10 mLs (1 g total) by mouth 4 (four) times daily -  with meals and at bedtime. Take liquid or tablets but not both 420 mL 3  . SYNTHROID 150 MCG tablet TAKE 1 TABLET BY MOUTH DAILY BEFORE BREAKFAST. 90 tablet 0  . tamsulosin (FLOMAX) 0.4 MG CAPS capsule TAKE 1 CAPSULE BY MOUTH EVERY DAY 90 capsule 3  . alprostadil (MUSE) 125 MCG pellet 1 each (125 mcg total) by Transurethral route as needed for erectile dysfunction (makes 2 doses in 24 hours). use no more than 3 times per week 6 each 1  . methylPREDNISolone (MEDROL) 4 MG tablet 5 tab po qd X 1d then 4 tab po qd X 1d then 3 tab po qd X 1d then 2 tab po qd  then 1 tab po qd 15 tablet 1  . potassium chloride SA (KLOR-CON) 20 MEQ tablet 1 tab daily prn when taking a second lasix tab any given day. 30 tablet 3  . triamcinolone cream (KENALOG) 0.1 % Apply 1 application topically 2 (two) times daily. 30 g 2   No facility-administered medications prior to visit.    No Known Allergies  Review of Systems  Constitutional: Positive for malaise/fatigue. Negative for fever.  HENT: Negative for congestion.   Eyes: Negative for blurred vision.  Respiratory: Negative for shortness of breath.   Cardiovascular: Negative for chest pain, palpitations and leg swelling.  Gastrointestinal: Negative for abdominal pain, blood in stool and nausea.  Genitourinary: Negative for dysuria and frequency.  Musculoskeletal: Negative for falls.  Skin: Negative for rash.  Neurological: Positive for weakness. Negative for dizziness, loss of consciousness and headaches.  Endo/Heme/Allergies: Negative for environmental allergies.  Psychiatric/Behavioral: Positive for depression. The patient is not nervous/anxious.        Objective:    Physical Exam Vitals and nursing note reviewed.  Constitutional:      General: He is not in acute distress.    Appearance: He is well-developed.  HENT:     Head: Normocephalic and atraumatic.     Nose: Nose normal.  Eyes:     General:        Right eye: No discharge.        Left eye: No discharge.  Cardiovascular:     Rate and Rhythm: Normal rate and regular rhythm.  Heart sounds: No murmur.  Pulmonary:     Effort: Pulmonary effort is normal.     Breath sounds: Normal breath sounds.  Abdominal:     General: Bowel sounds are normal.     Palpations: Abdomen is soft.     Tenderness: There is no abdominal tenderness.  Musculoskeletal:     Cervical back: Normal range of motion and neck supple.     Comments: In wheelchair  Skin:    General: Skin is warm and dry.     Comments: Yellowish scabbing above upper lip on right side  and an oval lesion roughly 2 cm x 4 cm in diameter on left upper extremity, erythematous and raised.   Neurological:     Mental Status: He is alert and oriented to person, place, and time.     BP 112/62 (BP Location: Left Arm, Patient Position: Sitting, Cuff Size: Large)   Pulse 72   Resp 17   Ht 6\' 3"  (1.905 m)   Wt 289 lb (131.1 kg)   SpO2 100%   BMI 36.12 kg/m  Wt Readings from Last 3 Encounters:  04/03/20 289 lb (131.1 kg)  04/02/20 290 lb (131.5 kg)  01/05/20 299 lb 6.4 oz (135.8 kg)    Diabetic Foot Exam - Simple   No data filed     Lab Results  Component Value Date   WBC 6.8 01/05/2020   HGB 13.7 01/05/2020   HCT 41.1 01/05/2020   PLT 173.0 01/05/2020   GLUCOSE 93 01/05/2020   CHOL 146 01/05/2020   TRIG 40.0 01/05/2020   HDL 57.10 01/05/2020   LDLCALC 81 01/05/2020   ALT 22 01/05/2020   AST 16 01/05/2020   NA 141 01/05/2020   K 4.0 01/05/2020   CL 104 01/05/2020   CREATININE 0.80 01/05/2020   BUN 13 01/05/2020   CO2 32 01/05/2020   TSH 1.28 01/05/2020   PSA 0.52 02/11/2016   INR 1.00 04/21/2011    Lab Results  Component Value Date   TSH 1.28 01/05/2020   Lab Results  Component Value Date   WBC 6.8 01/05/2020   HGB 13.7 01/05/2020   HCT 41.1 01/05/2020   MCV 89.3 01/05/2020   PLT 173.0 01/05/2020   Lab Results  Component Value Date   NA 141 01/05/2020   K 4.0 01/05/2020   CO2 32 01/05/2020   GLUCOSE 93 01/05/2020   BUN 13 01/05/2020   CREATININE 0.80 01/05/2020   BILITOT 0.5 01/05/2020   ALKPHOS 77 01/05/2020   AST 16 01/05/2020   ALT 22 01/05/2020   PROT 6.3 01/05/2020   ALBUMIN 4.1 01/05/2020   CALCIUM 9.0 01/05/2020   ANIONGAP 10 12/07/2015   GFR 103.68 01/05/2020   Lab Results  Component Value Date   CHOL 146 01/05/2020   Lab Results  Component Value Date   HDL 57.10 01/05/2020   Lab Results  Component Value Date   LDLCALC 81 01/05/2020   Lab Results  Component Value Date   TRIG 40.0 01/05/2020   Lab Results    Component Value Date   CHOLHDL 3 01/05/2020   No results found for: HGBA1C     Assessment & Plan:   Problem List Items Addressed This Visit    Multiple sclerosis (HCC)    Is mostly wheelchair confined now and so his physical therapy has been adjusted to mostly work on upper body strength.       Hyperlipidemia    Tolerating statin, encouraged heart healthy diet, avoid  trans fats, minimize simple carbs and saturated fats. Increase exercise as tolerated      Relevant Medications   alprostadil (MUSE) 125 MCG pellet   HTN (hypertension)    Well controlled, no changes to meds. Encouraged heart healthy diet such as the DASH diet and exercise as tolerated.       Relevant Medications   alprostadil (MUSE) 125 MCG pellet   Depression with anxiety    He continues with counselor and encouraged to take Cymbalta 60 mg bid      Erectile dysfunction - Primary    His insurance has agreed to cover the Muse but he is having trouble finding a pharmacy that will cover it. Will send prescription to his mail order       Relevant Orders   Ambulatory referral to Urology   Rash    Left arm. Started on Triamcinolone bid if worsens given a prescription for Medrol dosepak      Impetigo    Above upper lip on right. Given rx for Mupirocin ointment he will report if worsens instead of improves.          I have changed Keith Morrow's triamcinolone cream. I am also having him maintain his cetirizine, aspirin EC, ibuprofen, BACLOFEN IT, natalizumab, Clonidine HCl, clotrimazole-betamethasone, nitroGLYCERIN, clobetasol cream, nystatin cream, cyclobenzaprine, baclofen, sucralfate, famotidine, Cymbalta, omeprazole, Synthroid, furosemide, dalfampridine, olmesartan-hydrochlorothiazide, methylphenidate, tamsulosin, gabapentin, clonazePAM, sucralfate, busPIRone, atorvastatin, alprostadil, and methylPREDNISolone.  Meds ordered this encounter  Medications  . alprostadil (MUSE) 125 MCG pellet    Sig: 1 each  (125 mcg total) by Transurethral route as needed for erectile dysfunction (makes 2 doses in 24 hours). use no more than 3 times per week    Dispense:  18 each    Refill:  1  . triamcinolone cream (KENALOG) 0.1 %    Sig: Apply 1 application topically 2 (two) times daily as needed.    Dispense:  80 g    Refill:  2  . methylPREDNISolone (MEDROL) 4 MG tablet    Sig: 5 tab po qd X 1d then 4 tab po qd X 1d then 3 tab po qd X 1d then 2 tab po qd then 1 tab po qd    Dispense:  15 tablet    Refill:  1     [name], MD

## 2020-04-05 ENCOUNTER — Other Ambulatory Visit: Payer: Self-pay | Admitting: Family Medicine

## 2020-04-05 MED ORDER — MUPIROCIN 2 % EX OINT: 1.0000 "application " | TOPICAL_OINTMENT | Freq: Two times a day (BID) | CUTANEOUS | 1 refills | Status: DC | PRN

## 2020-04-13 ENCOUNTER — Ambulatory Visit (INDEPENDENT_AMBULATORY_CARE_PROVIDER_SITE_OTHER): Payer: 59 | Admitting: Psychology

## 2020-04-13 DIAGNOSIS — F4323 Adjustment disorder with mixed anxiety and depressed mood: Secondary | ICD-10-CM | POA: Diagnosis not present

## 2020-04-25 ENCOUNTER — Other Ambulatory Visit: Payer: Self-pay | Admitting: Neurology

## 2020-04-25 ENCOUNTER — Other Ambulatory Visit: Payer: Self-pay | Admitting: *Deleted

## 2020-04-25 MED ORDER — METHYLPHENIDATE HCL 10 MG PO TABS: 10.0000 mg | ORAL_TABLET | Freq: Four times a day (QID) | ORAL | 0 refills | Status: DC

## 2020-04-26 ENCOUNTER — Ambulatory Visit (INDEPENDENT_AMBULATORY_CARE_PROVIDER_SITE_OTHER): Payer: 59 | Admitting: Psychology

## 2020-04-26 DIAGNOSIS — F4323 Adjustment disorder with mixed anxiety and depressed mood: Secondary | ICD-10-CM

## 2020-05-09 ENCOUNTER — Other Ambulatory Visit: Payer: Self-pay | Admitting: *Deleted

## 2020-05-09 DIAGNOSIS — G35 Multiple sclerosis: Secondary | ICD-10-CM

## 2020-05-09 DIAGNOSIS — Z79899 Other long term (current) drug therapy: Secondary | ICD-10-CM

## 2020-05-09 NOTE — Progress Notes (Signed)
Pt coming for infusion 05/10/20. Due for JCV/CBC. Placed future labs orders.

## 2020-05-10 ENCOUNTER — Telehealth: Payer: Self-pay | Admitting: *Deleted

## 2020-05-10 ENCOUNTER — Other Ambulatory Visit: Payer: Self-pay | Admitting: Family Medicine

## 2020-05-10 NOTE — Telephone Encounter (Signed)
Placed JCV lab in [facility] lock box for routine lab pick up. Results pending. 

## 2020-05-10 NOTE — Addendum Note (Signed)
Addended by: Tamera Stands D on: 05/10/2020 09:08 AM   Modules accepted: Orders

## 2020-05-11 ENCOUNTER — Ambulatory Visit (INDEPENDENT_AMBULATORY_CARE_PROVIDER_SITE_OTHER): Payer: 59 | Admitting: Psychology

## 2020-05-11 DIAGNOSIS — F4323 Adjustment disorder with mixed anxiety and depressed mood: Secondary | ICD-10-CM | POA: Diagnosis not present

## 2020-05-11 LAB — CBC WITH DIFFERENTIAL/PLATELET
Basophils Absolute: 0.1 10*3/uL (ref 0.0–0.2)
Basos: 1 %
EOS (ABSOLUTE): 1.2 10*3/uL — ABNORMAL HIGH (ref 0.0–0.4)
Eos: 13 %
Hematocrit: 40.4 % (ref 37.5–51.0)
Hemoglobin: 13.3 g/dL (ref 13.0–17.7)
Immature Grans (Abs): 0.1 10*3/uL (ref 0.0–0.1)
Immature Granulocytes: 1 %
Lymphocytes Absolute: 2.5 10*3/uL (ref 0.7–3.1)
Lymphs: 28 %
MCH: 29.8 pg (ref 26.6–33.0)
MCHC: 32.9 g/dL (ref 31.5–35.7)
MCV: 90 fL (ref 79–97)
Monocytes Absolute: 0.6 10*3/uL (ref 0.1–0.9)
Monocytes: 7 %
Neutrophils Absolute: 4.4 10*3/uL (ref 1.4–7.0)
Neutrophils: 50 %
Platelets: 188 10*3/uL (ref 150–450)
RBC: 4.47 x10E6/uL (ref 4.14–5.80)
RDW: 14 % (ref 11.6–15.4)
WBC: 8.9 10*3/uL (ref 3.4–10.8)

## 2020-05-16 LAB — STRATIFY JCV AB (W/ INDEX) W/ RFLX
Index Value: 0.15
Stratify JCV (TM) Ab w/Reflex Inhibition: NEGATIVE

## 2020-05-23 ENCOUNTER — Other Ambulatory Visit: Payer: Self-pay | Admitting: *Deleted

## 2020-05-23 MED ORDER — CYMBALTA 60 MG PO CPEP: 60.0000 mg | ORAL_CAPSULE | Freq: Two times a day (BID) | ORAL | 1 refills | Status: DC

## 2020-05-23 NOTE — Telephone Encounter (Signed)
JCV ab drawn on 05/10/20. Negative, index: 0.15.

## 2020-05-24 ENCOUNTER — Ambulatory Visit (INDEPENDENT_AMBULATORY_CARE_PROVIDER_SITE_OTHER): Payer: 59 | Admitting: Psychology

## 2020-05-24 DIAGNOSIS — F4323 Adjustment disorder with mixed anxiety and depressed mood: Secondary | ICD-10-CM

## 2020-05-30 ENCOUNTER — Other Ambulatory Visit: Payer: Self-pay | Admitting: Family Medicine

## 2020-06-06 ENCOUNTER — Other Ambulatory Visit: Payer: Self-pay | Admitting: Family Medicine

## 2020-06-07 ENCOUNTER — Telehealth: Payer: Self-pay | Admitting: *Deleted

## 2020-06-07 ENCOUNTER — Ambulatory Visit (INDEPENDENT_AMBULATORY_CARE_PROVIDER_SITE_OTHER): Payer: 59 | Admitting: Psychology

## 2020-06-07 DIAGNOSIS — F4323 Adjustment disorder with mixed anxiety and depressed mood: Secondary | ICD-10-CM

## 2020-06-07 DIAGNOSIS — G35 Multiple sclerosis: Secondary | ICD-10-CM

## 2020-06-07 NOTE — Telephone Encounter (Signed)
Ok to [facility] MRI brain and cervical spine w/wo

## 2020-06-07 NOTE — Telephone Encounter (Signed)
Called pt. Relayed Dr. Bonnita Hollow message. He has to go to the hospital d/t having baclofen pump. Placed order. He verbalized understanding.  Asked it be done Monday-Wednesday so he can stop by after to have pump checked post MRI.

## 2020-06-07 NOTE — Telephone Encounter (Signed)
Pt here today for Tysabri infusion. Liane, RN reports he has been declining gradually and feels he needs repeat MRI's. Last MRI brain/cervical completed 09/27/2018. Pt aware we will f/u next week after Dr. Epimenio Foot gets back in to see if he would like to order repeat imaging. He has a f/u 08/14/20 with Dr. Epimenio Foot.

## 2020-06-14 ENCOUNTER — Telehealth: Payer: Self-pay | Admitting: Neurology

## 2020-06-14 ENCOUNTER — Other Ambulatory Visit: Payer: Self-pay | Admitting: *Deleted

## 2020-06-14 DIAGNOSIS — M79642 Pain in left hand: Secondary | ICD-10-CM

## 2020-06-14 DIAGNOSIS — G5600 Carpal tunnel syndrome, unspecified upper limb: Secondary | ICD-10-CM

## 2020-06-14 NOTE — Telephone Encounter (Signed)
just starting the case for [employer]. he has the coice plan with his insurance so they have to contact the patient first to go over the location options first

## 2020-06-19 ENCOUNTER — Telehealth: Payer: Self-pay | Admitting: Neurology

## 2020-06-19 NOTE — Telephone Encounter (Signed)
I spoke to the patient to make sure he wanted to go to [facility] since he has to come back to our office to get his pumped checked. He stated he is going to call his insurance back and see he may just end up going to [facility]. He stated that he would give me a call if he wanted the MRI order faxed to [facility] or [facility].

## 2020-06-19 NOTE — Telephone Encounter (Signed)
Kareem from [facility] called stating that the pt has requested for his MRI order to be faxed over to [facility] Fax# [phone] Nonnie Done stated that the Berkley Harvey has already been updated Please advise.

## 2020-06-20 ENCOUNTER — Ambulatory Visit (INDEPENDENT_AMBULATORY_CARE_PROVIDER_SITE_OTHER): Payer: 59 | Admitting: Psychology

## 2020-06-20 DIAGNOSIS — F4323 Adjustment disorder with mixed anxiety and depressed mood: Secondary | ICD-10-CM

## 2020-06-20 NOTE — Telephone Encounter (Signed)
Keith Morrow called and was made aware of the notes from [place], this is FYI no call back requested

## 2020-06-20 NOTE — Telephone Encounter (Signed)
Keith Morrow has called asking to speak with Keith Morrow re: the MRI being done at [facility]  He is asking that the fax be sent to [phone]

## 2020-06-20 NOTE — Telephone Encounter (Signed)
I faxed the orders to [facility] ph # [phone] & fax # [phone].

## 2020-06-20 NOTE — Telephone Encounter (Signed)
Rutherford Nail: [id number] (exp. 06/14/20 to 09/12/20) order faxed to [facility] they will reach out to the pt to schedule

## 2020-06-20 NOTE — Telephone Encounter (Signed)
I spoke with Hilda Lias at [facility] and informed her can they give [place] a call once the patient is schedule because he will have to come to our office to get his pumped checked by Dr. Epimenio Foot.   Rutherford Nail: [id number] (exp. 06/14/20 to 09/12/20)

## 2020-06-20 NOTE — Telephone Encounter (Signed)
Maggie from [facility] called to get the patient's Medtronic pump information. I searched pt chart and found the information:   Pump Brand Name: Medtronic Model: [id number] Serial number: [id number] Job Founds also mentioned that he is currently scheduled to have the MRI done on 07/25/20 at the [facility] location and that he needed to be seen here after the MRI was done. He should be done there around 10/10:15 but Dr. Epimenio Foot does not have any openings around that time. She said if you are able to fit him in that day you can just contact him directly to let him know but if you wanted to speak with her to try and line up both schedules, she can be reached at [phone].

## 2020-06-21 NOTE — Telephone Encounter (Signed)
Called pt. Advised no appt needed. He can come to office after MRI for me to interrogate pump to make sure it restarted. I placed him on nurse schedule as reminder. Did advise I normally take lunch from 12-1pm. He will park and call office once he arrives for me to check pump. Nothing further needed.

## 2020-06-22 ENCOUNTER — Telehealth: Payer: Self-pay | Admitting: Neurology

## 2020-06-22 NOTE — Telephone Encounter (Signed)
Carla from [facility] called stating that they are needing the RX for the Pump Refill Electronically sent to them. Please advise. [phone] extension [phone]

## 2020-06-25 NOTE — Telephone Encounter (Signed)
Called [facility] again and spoke with Vernona Rieger, she transferred call to [place]. Narcotics has to be sent electronically but he only has baclofen. Electronic signature not needed. They confirmed they received signed order by fax. Nothing further needed.

## 2020-06-25 NOTE — Telephone Encounter (Signed)
Tried calling back. Office closed, hours: 9-6pm EST

## 2020-07-02 ENCOUNTER — Telehealth: Payer: Self-pay | Admitting: *Deleted

## 2020-07-02 NOTE — Telephone Encounter (Signed)
Received fax from touch prescribing program that pt re-authorized for Tysabri from 07/02/2020-02/03/2020 Pt enrollment number: [id number]. Account: [facility]. Site auth number: [id number].

## 2020-07-03 ENCOUNTER — Ambulatory Visit (INDEPENDENT_AMBULATORY_CARE_PROVIDER_SITE_OTHER): Payer: Managed Care, Other (non HMO) | Admitting: Family Medicine

## 2020-07-03 ENCOUNTER — Other Ambulatory Visit: Payer: Self-pay

## 2020-07-03 VITALS — BP 116/60 | HR 87 | Temp 98.1°F | Resp 12 | Ht 75.0 in | Wt 298.8 lb

## 2020-07-03 DIAGNOSIS — I2510 Atherosclerotic heart disease of native coronary artery without angina pectoris: Secondary | ICD-10-CM | POA: Diagnosis not present

## 2020-07-03 DIAGNOSIS — E039 Hypothyroidism, unspecified: Secondary | ICD-10-CM

## 2020-07-03 DIAGNOSIS — R5383 Other fatigue: Secondary | ICD-10-CM

## 2020-07-03 DIAGNOSIS — I10 Essential (primary) hypertension: Secondary | ICD-10-CM

## 2020-07-03 DIAGNOSIS — R739 Hyperglycemia, unspecified: Secondary | ICD-10-CM

## 2020-07-03 DIAGNOSIS — E785 Hyperlipidemia, unspecified: Secondary | ICD-10-CM

## 2020-07-03 DIAGNOSIS — F418 Other specified anxiety disorders: Secondary | ICD-10-CM

## 2020-07-03 LAB — CBC
HCT: 39.2 % (ref 39.0–52.0)
Hemoglobin: 13.2 g/dL (ref 13.0–17.0)
MCHC: 33.7 g/dL (ref 30.0–36.0)
MCV: 90.4 fl (ref 78.0–100.0)
Platelets: 181 10*3/uL (ref 150.0–400.0)
RBC: 4.34 Mil/uL (ref 4.22–5.81)
RDW: 14.6 % (ref 11.5–15.5)
WBC: 8 10*3/uL (ref 4.0–10.5)

## 2020-07-03 LAB — COMPREHENSIVE METABOLIC PANEL
ALT: 23 U/L (ref 0–53)
AST: 16 U/L (ref 0–37)
Albumin: 4 g/dL (ref 3.5–5.2)
Alkaline Phosphatase: 69 U/L (ref 39–117)
BUN: 15 mg/dL (ref 6–23)
CO2: 32 mEq/L (ref 19–32)
Calcium: 9.2 mg/dL (ref 8.4–10.5)
Chloride: 101 mEq/L (ref 96–112)
Creatinine, Ser: 0.89 mg/dL (ref 0.40–1.50)
GFR: 91.48 mL/min (ref >60.00)
Glucose, Bld: 95 mg/dL (ref 70–99)
Potassium: 3.8 mEq/L (ref 3.5–5.1)
Sodium: 138 mEq/L (ref 135–145)
Total Bilirubin: 0.5 mg/dL (ref 0.2–1.2)
Total Protein: 6.3 g/dL (ref 6.0–8.3)

## 2020-07-03 LAB — LIPID PANEL
Cholesterol: 137 mg/dL (ref 0–200)
HDL: 51.5 mg/dL (ref >39.00)
LDL Cholesterol: 75 mg/dL (ref 0–99)
NonHDL: 85.07
Total CHOL/HDL Ratio: 3
Triglycerides: 50 mg/dL (ref 0.0–149.0)
VLDL: 10 mg/dL (ref 0.0–40.0)

## 2020-07-03 LAB — HEMOGLOBIN A1C: Hgb A1c MFr Bld: 5.6 % (ref 4.6–6.5)

## 2020-07-03 LAB — TSH: TSH: 0.54 u[IU]/mL (ref 0.35–4.50)

## 2020-07-03 MED ORDER — BUSPIRONE HCL 10 MG PO TABS: 10.0000 mg | ORAL_TABLET | Freq: Three times a day (TID) | ORAL | 4 refills | Status: DC

## 2020-07-03 MED ORDER — BUSPIRONE HCL 10 MG PO TABS: 10.0000 mg | ORAL_TABLET | Freq: Three times a day (TID) | ORAL | 3 refills | Status: DC

## 2020-07-03 NOTE — Patient Instructions (Addendum)
Provigil and Nuvigil are for fatigue and we use frequently to treat daytime fatigue when people have sleep apnea for instance.   Cooking Light meal plans  Sleep Apnea Sleep apnea is a condition in which breathing pauses or becomes shallow during sleep. Episodes of sleep apnea usually last 10 seconds or longer, and they may occur as many as 20 times an hour. Sleep apnea disrupts your sleep and keeps your body from getting the rest that it needs. This condition can increase your risk of certain health problems, including:  Heart attack.  Stroke.  Obesity.  Diabetes.  Heart failure.  Irregular heartbeat. What are the causes? There are three kinds of sleep apnea:  Obstructive sleep apnea. This kind is caused by a blocked or collapsed airway.  Central sleep apnea. This kind happens when the part of the brain that controls breathing does not send the correct signals to the muscles that control breathing.  Mixed sleep apnea. This is a combination of obstructive and central sleep apnea. The most common cause of this condition is a collapsed or blocked airway. An airway can collapse or become blocked if:  Your throat muscles are abnormally relaxed.  Your tongue and tonsils are larger than normal.  You are overweight.  Your airway is smaller than normal. What increases the risk? You are more likely to develop this condition if you:  Are overweight.  Smoke.  Have a smaller than normal airway.  Are elderly.  Are male.  Drink alcohol.  Take sedatives or tranquilizers.  Have a family history of sleep apnea. What are the signs or symptoms? Symptoms of this condition include:  Trouble staying asleep.  Daytime sleepiness and tiredness.  Irritability.  Loud snoring.  Morning headaches.  Trouble concentrating.  Forgetfulness.  Decreased interest in sex.  Unexplained sleepiness.  Mood swings.  Personality changes.  Feelings of depression.  Waking up often  during the night to urinate.  Dry mouth.  Sore throat. How is this diagnosed? This condition may be diagnosed with:  A medical history.  A physical exam.  A series of tests that are done while you are sleeping (sleep study). These tests are usually done in a sleep lab, but they may also be done at home. How is this treated? Treatment for this condition aims to restore normal breathing and to ease symptoms during sleep. It may involve managing health issues that can affect breathing, such as high blood pressure or obesity. Treatment may include:  Sleeping on your side.  Using a decongestant if you have nasal congestion.  Avoiding the use of depressants, including alcohol, sedatives, and narcotics.  Losing weight if you are overweight.  Making changes to your diet.  Quitting smoking.  Using a device to open your airway while you sleep, such as: ? An oral appliance. This is a custom-made mouthpiece that shifts your lower jaw forward. ? A continuous positive airway pressure (CPAP) device. This device blows air through a mask when you breathe out (exhale). ? A nasal expiratory positive airway pressure (EPAP) device. This device has valves that you put into each nostril. ? A bi-level positive airway pressure (BPAP) device. This device blows air through a mask when you breathe in (inhale) and breathe out (exhale).  Having surgery if other treatments do not work. During surgery, excess tissue is removed to create a wider airway. It is important to get treatment for sleep apnea. Without treatment, this condition can lead to:  High blood pressure.  Coronary artery  disease.  In men, an inability to achieve or maintain an erection (impotence).  Reduced thinking abilities. Follow these instructions at home: Lifestyle  Make any lifestyle changes that your health care provider recommends.  Eat a healthy, well-balanced diet.  Take steps to lose weight if you are  overweight.  Avoid using depressants, including alcohol, sedatives, and narcotics.  Do not use any products that contain nicotine or tobacco, such as cigarettes, e-cigarettes, and chewing tobacco. If you need help quitting, ask your health care provider. General instructions  Take over-the-counter and prescription medicines only as told by your health care provider.  If you were given a device to open your airway while you sleep, use it only as told by your health care provider.  If you are having surgery, make sure to tell your health care provider you have sleep apnea. You may need to bring your device with you.  Keep all follow-up visits as told by your health care provider. This is important. Contact a health care provider if:  The device that you received to open your airway during sleep is uncomfortable or does not seem to be working.  Your symptoms do not improve.  Your symptoms get worse. Get help right away if:  You develop: ? Chest pain. ? Shortness of breath. ? Discomfort in your back, arms, or stomach.  You have: ? Trouble speaking. ? Weakness on one side of your body. ? Drooping in your face. These symptoms may represent a serious problem that is an emergency. Do not wait to see if the symptoms will go away. Get medical help right away. Call your local emergency services (911 in the [place] Do not drive yourself to the hospital. Summary  Sleep apnea is a condition in which breathing pauses or becomes shallow during sleep.  The most common cause is a collapsed or blocked airway.  The goal of treatment is to restore normal breathing and to ease symptoms during sleep. This information is not intended to replace advice given to you by your health care provider. Make sure you discuss any questions you have with your health care provider. Document Revised: 05/11/2019 Document Reviewed: 07/20/2018 Elsevier Patient Education  2020 [employer].

## 2020-07-03 NOTE — Assessment & Plan Note (Signed)
Well controlled, no changes to meds. Encouraged heart healthy diet such as the DASH diet and exercise as tolerated.  °

## 2020-07-03 NOTE — Assessment & Plan Note (Addendum)
Has felt excessively sedated recently and he questions if it is a side effect of his medicine. Continue Ritalin for now.

## 2020-07-04 DIAGNOSIS — R739 Hyperglycemia, unspecified: Secondary | ICD-10-CM | POA: Insufficient documentation

## 2020-07-04 NOTE — Assessment & Plan Note (Signed)
Encouraged heart healthy diet, increase exercise, avoid trans fats, consider a krill oil cap daily. Tolerating Atorvastatin 

## 2020-07-04 NOTE — Assessment & Plan Note (Signed)
hgba1c acceptable, minimize simple carbs. Increase exercise as tolerated.  

## 2020-07-04 NOTE — Assessment & Plan Note (Signed)
On Levothyroxine, continue to monitor 

## 2020-07-04 NOTE — Assessment & Plan Note (Signed)
He continues counseling and reports he thinks the Buspar usually taken as 5 mg tid has been very helpful for his anxiety. He feels it is worth trying a higher dose as this might help more. Will increase to 10 mg po tid.

## 2020-07-04 NOTE — Progress Notes (Signed)
Subjective:    Patient ID: Keith Morrow, male    DOB: Jan 30, 1971, 49 y.o.   MRN: [id number]  Chief Complaint  Patient presents with  . Follow-up    HPI Patient is in today for follow upon chronic medical concerns. No recent febrile illness or hospitalizations. He has some heartburn still at times but largely has responded to meds. He is noting a good improvement in his anxiety with the addition of Buspar. No acute new concerns. Denies CP/palp/SOB/HA/congestion/fevers or GU c/o. Taking meds as prescribed  Past Medical History:  Diagnosis Date  . Acute bronchitis 05/09/2013  . Allergy   . Bronchitis, acute 12/10/2011  . Candidal skin infection 05/05/2017  . Chicken pox as a child  . Constipation 02/20/2017  . Coronary atherosclerosis of native coronary artery    a. NSTEMI 04/20/11: tx with Promus DES to LAD; bifurcation lesion with oDx 90% tx with POBA; b. staged PCI of right post. AV Branch with Promus DES; c. residual at cath 04/21/11:  AV groove CFX 50%, mRCA 30%,; EF 55%  . Depression   . Depression with anxiety   . Dermatitis, contact 12/10/2011  . Fatigue 10/15/2012  . Heart attack (HCC) 04-20-11  . HTN (hypertension)   . Hyperlipidemia   . Hypotestosteronism 11/17/2011  . Hypothyroidism   . Keloid scar   . MS (multiple sclerosis) (HCC)   . Neuromuscular disorder (HCC)    NUMBNESS/TINGLING  . Obesity   . Onychomycosis   . Preventative health care 10/07/2011  . Reflux 10/07/2011  . Sun-damaged skin 02/17/2016  . Testosterone deficiency 01/16/2012  . Tinea pedis   . 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 Visual changes 11/13/2016    Past Surgical History:  Procedure Laterality Date  . CORONARY ANGIOPLASTY WITH STENT PLACEMENT     2012  . PAIN PUMP IMPLANTATION N/A 08/29/2014   Procedure: Baclofen pump placement;  Surgeon: Maeola Harman, MD;  Location: [facility];  Service: Neurosurgery;  Laterality: N/A;  Baclofen pump placement  . PROGRAMABLE BACLOFEN PUMP REVISION   08/29/14  . right wrist surgery     CTR  . VASCULAR SURGERY     vericose vein in right femoral area  . VASECTOMY    . WISDOM TOOTH EXTRACTION      Family History  Problem Relation Age of Onset  . Heart attack Mother 41  . Thyroid disease Mother   . Heart disease Mother   . Thyroid disease Sister   . Thyroid disease Brother   . Heart disease Maternal Grandmother   . Hypertension Maternal Grandmother   . Hyperlipidemia Maternal Grandmother   . Heart disease Maternal Grandfather   . Hypertension Maternal Grandfather   . Hyperlipidemia Maternal Grandfather   . Stroke Paternal Grandfather   . Thyroid disease Sister   . Heart disease Father        2 stents  . Coronary artery disease Other        questionable in father    Social History   Socioeconomic History  . Marital status: Married    Spouse name: Not on file  . Number of children: 3  . Years of education: Not on file  . Highest education level: Not on file  Occupational History  . Not on file  Tobacco Use  . Smoking status: Never Smoker  . Smokeless tobacco: Never Used  Vaping Use  . Vaping Use: Never used  Substance and Sexual Activity  . Alcohol use: Yes  Comment: socially  . Drug use: No  . Sexual activity: Yes    Partners: Female    Comment: 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, works Engineer, maintenance, no dietary restrictions  Other Topics Concern  . Not on file  Social History Narrative  . Not on file   Social Determinants of Health   Financial Resource Strain:   . Difficulty of Paying Living Expenses:   Food Insecurity:   . Worried About Programme researcher, broadcasting/film/video in the Last Year:   . Barista in the Last Year:   Transportation Needs:   . Freight forwarder (Medical):   Marland Kitchen Lack of Transportation (Non-Medical):   Physical Activity:   . Days of Exercise per Week:   . Minutes of Exercise per Session:   Stress:   . Feeling of Stress :   Social Connections:   . Frequency of Communication with  Friends and Family:   . Frequency of Social Gatherings with Friends and Family:   . Attends Religious Services:   . Active Member of Clubs or Organizations:   . Attends Banker Meetings:   Marland Kitchen Marital Status:   Intimate Partner Violence:   . Fear of Current or Ex-Partner:   . Emotionally Abused:   Marland Kitchen Physically Abused:   . Sexually Abused:     Outpatient Medications Prior to Visit  Medication Sig Dispense Refill  . aspirin EC 81 MG tablet Take 81 mg by mouth daily.    Marland Kitchen atorvastatin (LIPITOR) 40 MG tablet Take 1 tablet (40 mg total) by mouth daily at 6 PM. 90 tablet 3  . baclofen (LIORESAL) 10 MG tablet Take 1 tablet (10 mg total) by mouth 3 (three) times daily as needed for muscle spasms. 90 each 5  . BACLOFEN IT 1 Dose by Intrathecal route as directed.     . cetirizine (ZYRTEC) 10 MG tablet Take 10 mg by mouth daily as needed for allergies.     . clobetasol cream (TEMOVATE) 0.05 % Apply 1 application topically 2 (two) times daily as needed (skin).    . clonazePAM (KLONOPIN) 1 MG tablet Take 1 tablet (1 mg total) by mouth 4 (four) times daily. 360 tablet 1  . Clonidine HCl POWD by Does not apply route. 720mg /ml IT Baclofen. Total dose 157.89mg /day. Last pump refill 06/02/16    . clotrimazole-betamethasone (LOTRISONE) cream Apply 1 application topically 2 (two) times daily as needed (anti-fungal). 30 g 2  . cyclobenzaprine (FLEXERIL) 10 MG tablet TAKE 1 TABLET BY MOUTH THREE TIMES A DAY AS NEEDED FOR MUSCLE SPASMS 90 tablet 3  . CYMBALTA 60 MG capsule Take 1 capsule (60 mg total) by mouth 2 (two) times daily. 180 capsule 1  . dalfampridine 10 MG TB12 TAKE 1 TABLET (10 MG TOTAL) EVERY 12 HOURS 180 tablet 3  . famotidine (PEPCID) 40 MG tablet Take 1 tablet (40 mg total) by mouth at bedtime. 90 tablet 3  . furosemide (LASIX) 20 MG tablet 1 ta po daily x 5 days and then as needed for SOB, weight gain>3#, pedal edema and can take a second tab daily as needed 45 tablet 3  . gabapentin  (NEURONTIN) 600 MG tablet Take 1 tablet (600 mg total) by mouth 4 (four) times daily. 360 tablet 3  . ibuprofen (ADVIL,MOTRIN) 200 MG tablet Take 200 mg by mouth every 6 (six) hours as needed for moderate pain.    . methylphenidate (RITALIN) 10 MG tablet Take 1 tablet (10 mg total)  by mouth 4 (four) times daily. 120 tablet 0  . mupirocin ointment (BACTROBAN) 2 % Apply 1 application topically 2 (two) times daily as needed. 22 g 1  . natalizumab (TYSABRI) 300 MG/15ML injection Inject 300 mg into the vein every 28 (twenty-eight) days.     . nitroGLYCERIN (NITROSTAT) 0.4 MG SL tablet PLACE 1 TABLET UNDER THE TOUNGE EVERY 5 MINUTES AS NEEDED FOR CHEST PAIN (X 3 DOSES) 25 tablet 0  . nystatin cream (MYCOSTATIN) Apply 1 application topically as directed. 30 g 2  . olmesartan-hydrochlorothiazide (BENICAR HCT) 20-12.5 MG tablet Take 1 tablet by mouth daily. 90 tablet 1  . omeprazole (PRILOSEC) 40 MG capsule TAKE 1 CAPSULE BY MOUTH EVERY DAY 90 capsule 1  . potassium chloride SA (KLOR-CON M20) 20 MEQ tablet TAKE 1 TABLET BY MOUTH EVERY DAY AS NEEDED WHEN TAKING A SECOND LASIX TAB ANY GIVEN DAY 90 tablet 1  . sucralfate (CARAFATE) 1 g tablet TAKE 1 TABLET BY MOUTH 4 TIMES DAILY - WITH MEALS AND AT BEDTIME. TAKE TABLET OR LIQUID BUT NOT BOTH 120 tablet 1  . sucralfate (CARAFATE) 1 GM/10ML suspension Take 10 mLs (1 g total) by mouth 4 (four) times daily -  with meals and at bedtime. Take liquid or tablets but not both 420 mL 3  . SYNTHROID 150 MCG tablet TAKE 1 TABLET BY MOUTH EVERY DAY BEFORE BREAKFAST 90 tablet 0  . tamsulosin (FLOMAX) 0.4 MG CAPS capsule TAKE 1 CAPSULE BY MOUTH EVERY DAY 90 capsule 3  . triamcinolone cream (KENALOG) 0.1 % Apply 1 application topically 2 (two) times daily as needed. 80 g 2  . busPIRone (BUSPAR) 5 MG tablet Take 1 tablet (5 mg total) by mouth 4 (four) times daily as needed. 360 tablet 0  . alprostadil (MUSE) 125 MCG pellet 1 each (125 mcg total) by Transurethral route as needed  for erectile dysfunction (makes 2 doses in 24 hours). use no more than 3 times per week 18 each 1  . methylPREDNISolone (MEDROL) 4 MG tablet 5 tab po qd X 1d then 4 tab po qd X 1d then 3 tab po qd X 1d then 2 tab po qd then 1 tab po qd (Patient not taking: Reported on 07/03/2020) 15 tablet 1   No facility-administered medications prior to visit.    No Known Allergies  Review of Systems  Constitutional: Positive for malaise/fatigue. Negative for fever.  HENT: Negative for congestion.   Eyes: Negative for blurred vision.  Respiratory: Negative for shortness of breath.   Cardiovascular: Negative for chest pain, palpitations and leg swelling.  Gastrointestinal: Positive for heartburn. Negative for abdominal pain, blood in stool and nausea.  Genitourinary: Negative for dysuria and frequency.  Musculoskeletal: Negative for falls.  Skin: Negative for rash.  Neurological: Negative for dizziness, loss of consciousness and headaches.  Endo/Heme/Allergies: Negative for environmental allergies.  Psychiatric/Behavioral: Positive for depression.       Objective:    Physical Exam  BP (!) 116/60 (BP Location: Left Arm, Cuff Size: Large)   Pulse 87   Temp 98.1 F (36.7 C) (Oral)   Resp 12   Ht  (1.905 m)   Wt (!) 298 lb 12.8 oz (135.5 kg)   SpO2 98%   BMI 37.35 kg/m  Wt Readings from Last 3 Encounters:  07/03/20 (!) 298 lb 12.8 oz (135.5 kg)  04/03/20 289 lb (131.1 kg)  04/02/20 290 lb (131.5 kg)    Diabetic Foot Exam - Simple   No data  filed     Lab Results  Component Value Date   WBC 8.0 07/03/2020   HGB 13.2 07/03/2020   HCT 39.2 07/03/2020   PLT 181.0 07/03/2020   GLUCOSE 95 07/03/2020   CHOL 137 07/03/2020   TRIG 50.0 07/03/2020   HDL 51.50 07/03/2020   LDLCALC 75 07/03/2020   ALT 23 07/03/2020   AST 16 07/03/2020   NA 138 07/03/2020   K 3.8 07/03/2020   CL 101 07/03/2020   CREATININE 0.89 07/03/2020   BUN 15 07/03/2020   CO2 32 07/03/2020   TSH 0.54  07/03/2020   PSA 0.52 02/11/2016   INR 1.00 04/21/2011   HGBA1C 5.6 07/03/2020    Lab Results  Component Value Date   TSH 0.54 07/03/2020   Lab Results  Component Value Date   WBC 8.0 07/03/2020   HGB 13.2 07/03/2020   HCT 39.2 07/03/2020   MCV 90.4 07/03/2020   PLT 181.0 07/03/2020   Lab Results  Component Value Date   NA 138 07/03/2020   K 3.8 07/03/2020   CO2 32 07/03/2020   GLUCOSE 95 07/03/2020   BUN 15 07/03/2020   CREATININE 0.89 07/03/2020   BILITOT 0.5 07/03/2020   ALKPHOS 69 07/03/2020   AST 16 07/03/2020   ALT 23 07/03/2020   PROT 6.3 07/03/2020   ALBUMIN 4.0 07/03/2020   CALCIUM 9.2 07/03/2020   ANIONGAP 10 12/07/2015   GFR 91.48 07/03/2020   Lab Results  Component Value Date   CHOL 137 07/03/2020   Lab Results  Component Value Date   HDL 51.50 07/03/2020   Lab Results  Component Value Date   LDLCALC 75 07/03/2020   Lab Results  Component Value Date   TRIG 50.0 07/03/2020   Lab Results  Component Value Date   CHOLHDL 3 07/03/2020   Lab Results  Component Value Date   HGBA1C 5.6 07/03/2020       Assessment & Plan:   Problem List Items Addressed This Visit    CAD (coronary artery disease)   Hyperlipidemia - Primary    Encouraged heart healthy diet, increase exercise, avoid trans fats, consider a krill oil cap daily. Tolerating Atorvastatin      Relevant Orders   Lipid panel (Completed)   Hypothyroidism    On Levothyroxine, continue to monitor      Relevant Orders   Hemoglobin A1c (Completed)   HTN (hypertension)    Well controlled, no changes to meds. Encouraged heart healthy diet such as the DASH diet and exercise as tolerated.       Relevant Orders   CBC (Completed)   Comprehensive metabolic panel (Completed)   TSH (Completed)   Depression with anxiety    He continues counseling and reports he thinks the Buspar usually taken as 5 mg tid has been very helpful for his anxiety. He feels it is worth trying a higher dose as  this might help more. Will increase to 10 mg po tid.       Relevant Medications   busPIRone (BUSPAR) 10 MG tablet   Fatigue    Has felt excessively sedated recently and he questions if       Hyperglycemia    hgba1c acceptable, minimize simple carbs. Increase exercise as tolerated.       Relevant Orders   Hemoglobin A1c (Completed)      I have discontinued Teena Irani. Roots's alprostadil and methylPREDNISolone. I am also having him maintain his cetirizine, aspirin EC, ibuprofen, BACLOFEN IT, natalizumab, Clonidine  HCl, clotrimazole-betamethasone, nitroGLYCERIN, clobetasol cream, nystatin cream, baclofen, sucralfate, famotidine, furosemide, dalfampridine, olmesartan-hydrochlorothiazide, tamsulosin, gabapentin, clonazePAM, sucralfate, atorvastatin, triamcinolone cream, potassium chloride SA, mupirocin ointment, cyclobenzaprine, methylphenidate, omeprazole, Cymbalta, Synthroid, and busPIRone.  Meds ordered this encounter  Medications  . DISCONTD: busPIRone (BUSPAR) 10 MG tablet    Sig: Take 1 tablet (10 mg total) by mouth 3 (three) times daily.    Dispense:  90 tablet    Refill:  4  . busPIRone (BUSPAR) 10 MG tablet    Sig: Take 1 tablet (10 mg total) by mouth 3 (three) times daily.    Dispense:  90 tablet    Refill:  3     [name], MD

## 2020-07-05 ENCOUNTER — Ambulatory Visit (INDEPENDENT_AMBULATORY_CARE_PROVIDER_SITE_OTHER): Payer: 59 | Admitting: Psychology

## 2020-07-05 DIAGNOSIS — F4323 Adjustment disorder with mixed anxiety and depressed mood: Secondary | ICD-10-CM | POA: Diagnosis not present

## 2020-07-09 ENCOUNTER — Other Ambulatory Visit: Payer: Self-pay | Admitting: Neurosurgery

## 2020-07-10 ENCOUNTER — Ambulatory Visit (INDEPENDENT_AMBULATORY_CARE_PROVIDER_SITE_OTHER): Payer: Managed Care, Other (non HMO) | Admitting: Neurology

## 2020-07-10 ENCOUNTER — Encounter (INDEPENDENT_AMBULATORY_CARE_PROVIDER_SITE_OTHER): Payer: Managed Care, Other (non HMO) | Admitting: Neurology

## 2020-07-10 DIAGNOSIS — M79642 Pain in left hand: Secondary | ICD-10-CM | POA: Diagnosis not present

## 2020-07-10 DIAGNOSIS — G5600 Carpal tunnel syndrome, unspecified upper limb: Secondary | ICD-10-CM

## 2020-07-10 DIAGNOSIS — Z0289 Encounter for other administrative examinations: Secondary | ICD-10-CM

## 2020-07-10 NOTE — Progress Notes (Signed)
Full Name: Keith Morrow Gender: Male MRN #: [id number] Date of Birth: 01/11/1971    Visit Date: 07/10/2020 08:18 Age: 49 Years Examining Physician: Despina Arias, MD  Referring Physician: Despina Arias, MD Height: 6 feet 3 inch     History: Keith Morrow is a 49 year old man with multiple sclerosis who has left hand pain and numbness.  Pain often awakens him at that time.  He has a Tinel's sign at the left wrist.  He has a history of right carpal tunnel release in the past  Nerve conduction studies: The left median motor response had a delayed distal latency with normal amplitude and forearm conduction.  The left ulnar motor response had a normal distal latency but there was slowing of the nerve at the ulnar groove associated with mild loss of amplitude.  The ulnar F-wave was mildly delayed.  Electromyography: Needle EMG of selected muscles of the left arm and hand was performed.  Motor unit morphology and recruitment was normal in all of the muscles tested.  There was no abnormal spontaneous activity.  The median sensory responses were mildly prolonged, left worse than right.  The left ulnar sensory response was normal but had a reduced amplitude.  Impression: This NCV/EMG study shows the following: 1.   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 2.   Borderline median neuropathy across the right wrist. 3.   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   No evidence of superimposed radiculopathy.  Verbal informed consent was obtained from the patient, patient was informed of potential risk of procedure, including bruising, bleeding, hematoma formation, infection, muscle weakness, muscle pain, numbness, among others.  Clinical note: We discussed that the symptoms in the hands are more likely to be due to carpal tunnel syndrome than to his MS.  Symptoms would likely improve with carpal tunnel release.  He is going to think about the timing of this intervention as he  has weakness from his MS in his legs and transfers would be difficult with only one arm.   -- RAS        MNC    Nerve / Sites Muscle Latency Ref. Amplitude Ref. Rel Amp Segments Distance Velocity Ref. Area    ms ms mV mV %  cm m/s m/s mVms  L Median - APB     Wrist APB 5.2 ?4.4 6.0 ?4.0 100 Wrist - APB 7   26.3     Upper arm APB 10.1  6.0  99.4 Upper arm - Wrist 24 49 ?49 26.4  L Ulnar - ADM     Wrist ADM 3.3 ?3.3 6.6 ?6.0 100 Wrist - ADM 7   29.5     B.Elbow ADM 8.0  6.1  92 B.Elbow - Wrist 23 49 ?49 28.1     A.Elbow ADM 11.6  4.6  75.9 A.Elbow - B.Elbow 10 28 ?49 20.1         SNC    Nerve / Sites Rec. Site Peak Lat Ref.  Amp Ref. Segments Distance    ms ms V V  cm  L Median - Orthodromic (Dig II, Mid palm)     Dig II Wrist 3.7 ?3.4 10 ?10 Dig II - Wrist 13  R Median - Orthodromic (Dig II, Mid palm)     Dig II Wrist 3.4 ?3.4 10 ?10 Dig II - Wrist 13  L Ulnar - Orthodromic, (Dig V, Mid palm)     Dig V  Wrist 3.0 ?3.1 3 ?5 Dig V - Wrist 47           F  Wave    Nerve F Lat Ref.   ms ms  L Ulnar - ADM 33.1 ?32.0       EMG Summary Table    Spontaneous MUAP Recruitment  Muscle IA Fib PSW Fasc Other Amp Dur. Poly Pattern  L. Abductor pollicis brevis Normal None None None _______ Normal Normal Normal Normal  L. First dorsal interosseous Normal None None None _______ Normal Normal Normal Normal  L. Deltoid Normal None None None _______ Normal Normal Normal Normal  L. Triceps Normal None None None _______ Normal Normal Normal Normal  L. Biceps Normal None None None _______ Normal Normal Normal Normal

## 2020-07-20 ENCOUNTER — Other Ambulatory Visit: Payer: Self-pay | Admitting: Family Medicine

## 2020-07-23 ENCOUNTER — Telehealth: Payer: Self-pay | Admitting: *Deleted

## 2020-07-23 ENCOUNTER — Other Ambulatory Visit: Payer: Self-pay | Admitting: Family Medicine

## 2020-07-23 NOTE — Telephone Encounter (Signed)
Faxed signed orders back to [facility] for pt baclofen pump. Fax: [phone]. Received fax confirmation.

## 2020-07-25 ENCOUNTER — Other Ambulatory Visit: Payer: Self-pay | Admitting: Family Medicine

## 2020-07-25 ENCOUNTER — Ambulatory Visit (INDEPENDENT_AMBULATORY_CARE_PROVIDER_SITE_OTHER): Payer: 59 | Admitting: Psychology

## 2020-07-25 ENCOUNTER — Ambulatory Visit (INDEPENDENT_AMBULATORY_CARE_PROVIDER_SITE_OTHER): Payer: Self-pay | Admitting: *Deleted

## 2020-07-25 DIAGNOSIS — F4323 Adjustment disorder with mixed anxiety and depressed mood: Secondary | ICD-10-CM | POA: Diagnosis not present

## 2020-07-25 DIAGNOSIS — G801 Spastic diplegic cerebral palsy: Secondary | ICD-10-CM

## 2020-07-25 DIAGNOSIS — G35 Multiple sclerosis: Secondary | ICD-10-CM

## 2020-07-25 NOTE — Progress Notes (Signed)
Pt here to have baclofen pump checked post MRI's . Verified that pump restarted after MRI's. Pt stable.

## 2020-07-30 ENCOUNTER — Encounter: Payer: Self-pay | Admitting: *Deleted

## 2020-08-04 ENCOUNTER — Telehealth: Payer: Self-pay | Admitting: Neurology

## 2020-08-04 NOTE — Telephone Encounter (Signed)
Could you please let the patient know that I reviewed the brain MRI and compared to his previous one from 2019.  There did not appear to be any new lesions.   Also it looks like he had a cervical spine MRI done as well that was read by the radiologist as showing no changes compared to his previous cervical spine MRI.  We did not get those images.  Could you please asked Gavin Pound to see if [facility] can send them to me    I personally reviewed the MRI of the brain performed at [facility] or this 2021.  The MRI of the brain shows foci within the spinal cord towards the right adjacent to C2.  There are a couple small T2/FLAIR hyperintense foci in the pons, right middle cerebellar peduncle and cerebellum.  There are multiple T2/FLAIR hyperintense foci in the periventricular, juxtacortical and deep white matter of the hemispheres.  None of the foci appear to be acute.  Compared to the MRI performed at [facility] 09/27/2018, there do not appear to be any new lesions.

## 2020-08-06 NOTE — Telephone Encounter (Signed)
Request made

## 2020-08-08 ENCOUNTER — Other Ambulatory Visit: Payer: Managed Care, Other (non HMO)

## 2020-08-08 ENCOUNTER — Other Ambulatory Visit: Payer: Self-pay | Admitting: Critical Care Medicine

## 2020-08-08 DIAGNOSIS — Z20822 Contact with and (suspected) exposure to covid-19: Secondary | ICD-10-CM

## 2020-08-09 ENCOUNTER — Ambulatory Visit (INDEPENDENT_AMBULATORY_CARE_PROVIDER_SITE_OTHER): Payer: 59 | Admitting: Psychology

## 2020-08-09 DIAGNOSIS — F4323 Adjustment disorder with mixed anxiety and depressed mood: Secondary | ICD-10-CM | POA: Diagnosis not present

## 2020-08-09 LAB — NOVEL CORONAVIRUS, NAA: SARS-CoV-2, NAA: NOT DETECTED

## 2020-08-14 ENCOUNTER — Other Ambulatory Visit: Payer: Self-pay

## 2020-08-14 ENCOUNTER — Ambulatory Visit (INDEPENDENT_AMBULATORY_CARE_PROVIDER_SITE_OTHER): Payer: Managed Care, Other (non HMO) | Admitting: Neurology

## 2020-08-14 ENCOUNTER — Encounter: Payer: Self-pay | Admitting: Neurology

## 2020-08-14 ENCOUNTER — Telehealth: Payer: Self-pay | Admitting: *Deleted

## 2020-08-14 VITALS — BP 118/80 | HR 115 | Ht 75.0 in

## 2020-08-14 DIAGNOSIS — Z95828 Presence of other vascular implants and grafts: Secondary | ICD-10-CM

## 2020-08-14 DIAGNOSIS — G35 Multiple sclerosis: Secondary | ICD-10-CM | POA: Diagnosis not present

## 2020-08-14 DIAGNOSIS — G801 Spastic diplegic cerebral palsy: Secondary | ICD-10-CM | POA: Diagnosis not present

## 2020-08-14 DIAGNOSIS — R269 Unspecified abnormalities of gait and mobility: Secondary | ICD-10-CM

## 2020-08-14 DIAGNOSIS — Z79899 Other long term (current) drug therapy: Secondary | ICD-10-CM

## 2020-08-14 DIAGNOSIS — R200 Anesthesia of skin: Secondary | ICD-10-CM

## 2020-08-14 NOTE — Progress Notes (Signed)
Faxed signed order to [facility] attn Seana: "Change basal rate to 24.33mcg baclofen per hour. Eliminate scheduled boluses". Fax: [phone]. Received fax confirmation.

## 2020-08-14 NOTE — Progress Notes (Addendum)
HPI    Follow-up     Additional comments: RM 12, alone. In electric WC in office today. Last seen 07/25/20 for pump refill. Reports new sx of numbness in three toes of left foot. Normally right foot only affected. Has had this for the last few days. Notices it most when he tries to curl toes.           Multiple Sclerosis     Additional comments: On Tysabri. Last:08/02/20, next: 08/30/20. Receives at [facility] w/ intrafusion. Last JCV 05/10/20 negative, index: 0.15       Last edited by Arther Abbott, RN on 08/14/2020  3:05 PM. (History)       Chief Complaint    Follow-up; Multiple Sclerosis      HISTORY:  Keith Morrow is a 49 y.o. man with a relapsing form of multiple sclerosis who has right greater than left leg weakness and progressive difficulties with gait function.  Due to severe spasticity, a baclofen pump was placed in 2013.  Update 08/14/2020: Over the last few days, he has noted more numbness in the left foot.  It is occurring in the dorsum of the left foot towards the 5th toe.    He does note he needs to sit more (now almost the whole day)   He notes no change in strength.   Previously only the right foot had numbness.  He gets cramps in his back.   He takes extra baclofen when that occurs.   He also has klonopin which helps spasms at night.   He gets some pain in his feet as the day goes on and notes more edema as the day goes on.   He wears compression hose.  He has a baclofen pump and will be getting a replacement next month with Dr. Venetia Maxon.  Currently, the pump is being refilled by a home nursing agency.  When he gets the new pump, he would like to switch from scheduled boluses to on demand boluses.  To aid in the transition we have recommended that the pump settings be changed with straight continuous intrathecal infusion rather than continuous plus boluses.   Current settings or a rate of 20.4 mcg/h and boluses of 60, 30 and 60 mg in the late evening and night.    I personally  reviewed the MRI of the cervical spine dated 07/25/2020 and compared it with an MRI of the cervical spine from 07/05/2015.  Both showed multiple T2 hyperintense foci within the spinal cord.  They are located posterolaterally to the right below the cervicomedullary junction, anteriorly adjacent to C3C4,  posterolaterally to the left adjacent to C4, posterior adjacent to C4-C5, both the left and the right adjacent to C4-C5, very large right focus adjacent to C5, posterior laterally to the left adjacent to C6, towards the right adjacent to C6 and posterior laterally to the right adjacent to C7-T1.  None of the foci enhance.  There did not appear to be any new lesions between 2016 and 2021.   There is a right disc protrusion causing mild to moderate right foraminal narrowing.  At C5-C6, there is a right disc protrusion but no nerve root compression  The MRI of the brain from 07/25/2020 was not included on the CD.  It shows multiple foci consistent with MS, unchanged compared to the 12/19/2015 MRI according to the official report.  Most foci are supratentorial but somewhat noted in the pons.  MS history:  In 1997, he had  left optic neuritis and then had numbness in both feet. MRI was consistent with MS and a lumbar puncture was also performed and CSF was consistent with MS. Initially, he was placed on Copaxone. He had difficulties with compliance and 5 years later switched to Avonex. Unfortunate, he had difficulties tolerating Avonex and also had difficulty tolerating Rebif. In 2004, he started Tysabri but was only able to take 2 doses before was taken off the market. He restarted in 2005 and has been on Tysabri since then with the exception of a 6 month holiday. Tolerates Tysabri well. He has not had any definite exacerbations though his gait has worsened over the past few years. He is JCV antibody negative.   He had recent MRI of the brain and spine and there were no changes.      Spasticity history: He was  experiencing progressive spasticity in both legs.  He got a baclofen pump in 2013.  Prior to the pump he was having severe tonic spasticity as well as frequent phasic spasms.  These improved.  Due to the spasticity, baclofen was increased in 2016 and 2017 but he became weaker.  I began to see him again and we added clonidine to the baclofen and cut his dose and added nighttime boluses.  He did better with those settings.  Imaging: MRI of the cervical spine dated 07/25/2020 and  MRI of the cervical spine from 07/05/2015.  Both showed multiple T2 hyperintense foci within the spinal cord.  They are located posterolaterally to the right below the cervicomedullary junction, anteriorly adjacent to C3C4,  posterolaterally to the left adjacent to C4, posterior adjacent to C4-C5, both the left and the right adjacent to C4-C5, very large right focus adjacent to C5, posterior laterally to the left adjacent to C6, towards the right adjacent to C6 and posterior laterally to the right adjacent to C7-T1.  None of the foci enhance.  There did not appear to be any new lesions between 2016 and 2021.   There is a right disc protrusion causing mild to moderate right foraminal narrowing.  At C5-C6, there is a right disc protrusion but no nerve root compression  The MRI of the brain from 07/25/2020 is unchanged compared to the 12/19/2015 MRI according to the official report.  Most foci are supratentorial but somewhat noted in the pons.    There are no enhancing lesions.   PHYSICAL EXAM  General: The patient is well-developed and well-nourished and in no acute distress.  Extremities:   There are no rashes or edema.  He has mild tenderness over the left subacromial bursa and some pain upon external rotation and elevation  Neurologic Exam  Mental status: The patient is alert and oriented x 3 at the time of the examination. Good focus/attention.   Speech is normal.  CN:   Extraocular muscles are normal.  No ptosis.     Symmetric facial strength and sensation.    PE/TP midline  Motor: Muscle bulk is normal.tone is increased in the right arm and minimally increased in the legs.  Strength is 4+/5 in the right arm, 5/5 in the left arm, 2+/5 in the right leg and 3 to 4-/5 in the left leg .   Reduced RAM in right hand  Sensation: He has reduced sensation in the outer aspect of the left foot and over the dorsum adjacent to the fourth and fifth digits.  Gait and station: Requires support to stand.     He has foot drop  bilaterally, worse on the right he cannot tandem walk  Reflexes: Deep tendon reflexes are trace at the left knee and 2+ at the right knee.No ankleclonus   ASSESSMENT  Multiple sclerosis (HCC)  Spastic diplegia (HCC)  Gait disturbance  High risk medication use  Presence of implanted infusion pump  Numbness    PLAN: 1.  Continue the intrathecal pump : 1800 mcg/mL baclofen and 420 mcg/mL clonidine.   The current basal infusion rate plus boluses will be converted to a basal rate of 24.2 mcg/h.  Orders will be sent to home health.   2.   Continue Tysabri every 4 weeks.    3.   Continue other med's    4.   New numbness is mild and could be related to pressure of the sciatic nerve or to an S1 radiculopathy.  If it worsens we would need to consider an MRI of the lumbar spine. 5.   Return in 6 months or sooner if there are new or worsening neurologic issues.  We can make further adjustments if needed before the next refill  Machel Violante A. Epimenio Foot, MD, PhD, FAAN Certified in Neurology, Clinical Neurophysiology, Sleep Medicine, Pain Medicine and Neuroimaging Director, Multiple Sclerosis Center at [facility]  [facility] [address], [address] [place], [place] [address] [phone]

## 2020-08-14 NOTE — Telephone Encounter (Signed)
R/c cd from [facility] cd mri cervical spine on [employer].

## 2020-08-15 ENCOUNTER — Telehealth: Payer: Self-pay | Admitting: Neurology

## 2020-08-15 NOTE — Telephone Encounter (Signed)
[facility]  Byrd Hesselbach) Would like to classify order for Pt's pump. Would like a call from the nurse.

## 2020-08-15 NOTE — Telephone Encounter (Signed)
Called and spoke with Byrd Hesselbach. She received order:"change basal rate to 24.29mcg baclofen per hour, eliminate scheduled boluses". She wanted to confirm he wants to change it to a simple continuous mode. Confirmed this.

## 2020-08-15 NOTE — Telephone Encounter (Signed)
FYI

## 2020-08-21 ENCOUNTER — Other Ambulatory Visit: Payer: Self-pay | Admitting: Family Medicine

## 2020-08-28 ENCOUNTER — Telehealth: Payer: Self-pay | Admitting: *Deleted

## 2020-08-28 NOTE — Telephone Encounter (Signed)
Received letter in the mail from [facility] that baclofen pump refill for [phone] Home infusion therapy with an implanted pump and supplies and [id number] home infusion therapy care coordination and all supplies is approved 09/09/20-03/08/21. Authorization: [id number]  Customer ID: [id number]

## 2020-08-30 ENCOUNTER — Ambulatory Visit (INDEPENDENT_AMBULATORY_CARE_PROVIDER_SITE_OTHER): Payer: 59 | Admitting: Psychology

## 2020-08-30 DIAGNOSIS — F4323 Adjustment disorder with mixed anxiety and depressed mood: Secondary | ICD-10-CM

## 2020-09-03 ENCOUNTER — Ambulatory Visit: Payer: Managed Care, Other (non HMO) | Admitting: Neurology

## 2020-09-11 NOTE — H&P (Signed)
Patient ID:   [id number] Patient: Keith Morrow  Date of Birth: [birth year] Visit Type: Office Visit   Date: 07/09/2020 09:15 AM Provider: Danae Orleans. Venetia Maxon MD   This 49 year old male presents for Baclofen pump replacement.  HISTORY OF PRESENT ILLNESS: 1.  Baclofen pump replacement  Mr. 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  He presents today for replacement of his baclofen pump.  His pump was originally placed on 08/29/2014. Pt pleased with baclofen therapy by pump. Dr. Epimenio Foot is managing his doses.  He reports that his baclofen pump is nearing the end of its life cycle and is in need of a replacement.  His pump was last refilled on 06/28/2020 and is due for a refill on 10/15/2020.  He stated that the RN that manages his pump that his pump is nearing the end of its life cycle and needs to be replaced soon.  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  Last detailed document date:07/31/2014.     Family History: Reviewed, no changes.  Last detailed document date:07/31/2014.   Social History: Reviewed, no changes. Last detailed document date: 07/31/2014.    MEDICATIONS: (added, continued or stopped this visit) Started Medication Directions Instruction Stopped  Ampyra 10 mg tablet,extended release take 1 tablet by oral route 2 times every day    aspirin 81 mg tablet,delayed release take 1 tablet by oral route  every day    Ativan 1 mg tablet take 1 tablet by oral route 3 times every day as needed    baclofen 20 mg tablet take 1 tablet by oral route 4- 8 times every day    Benicar 20 mg tablet take 1 tablet by oral route  every day    Cymbalta 60 mg capsule,delayed release take 1 capsule by oral route 2 times every day    Levitra 10 mg tablet take  1 tablet by oral route  every day as needed approximately 1 hour before sexual activity    Lipitor 40 mg tablet take 1 tablet by oral route  every day    Synthroid 150 mcg tablet take 1 tablet by oral route  every day    Tysabri 300 mg/15 mL intravenous solution infuse 15 milliliter by intravenous route  every 4 weeks diluted in 100 mL of 0.9 % NaCl over    Zyrtec 10 mg tablet take 1 tablet by oral route  every day as needed      ALLERGIES: Ingredient Reaction Medication Name Comment NO KNOWN ALLERGIES    No known allergies. Reviewed, no changes.    PHYSICAL EXAM:  Vitals Date Temp F BP Pulse Ht In Wt Lb BMI BSA Pain Score 07/09/2020  120/72 67 75    2/10   PHYSICAL EXAM Details General Level of Distress: no acute distress Overall Appearance: normal    Cardiovascular Cardiac: regular rate and rhythm without murmur  Respiratory Lungs: clear to auscultation  Neurological Recent and Remote Memory: normal Attention Span and Concentration:   normal Language: normal Fund of Knowledge: normal  Right Left Sensation: normal normal Upper Extremity Coordination: normal normal  Lower Extremity Coordination: normal normal  Musculoskeletal Gait and Station: normal  Right Left Upper Extremity Muscle Strength: normal normal Lower Extremity Muscle Strength: normal normal Upper Extremity Muscle Tone:  normal normal Lower Extremity  Muscle Tone: spastic normal   Motor Strength Upper and lower extremity motor strength was tested in the clinically pertinent muscles.     Deep Tendon Reflexes  Right Left Biceps: normal normal Triceps: normal normal Brachioradialis: normal normal Patellar: normal normal Achilles: normal normal  Sensory Sensation was tested at L1 to S1.   Cranial Nerves II. Optic Nerve/Visual Fields: normal III. Oculomotor: normal IV. Trochlear: normal V. Trigeminal: normal VI. Abducens: normal VII.  Facial: normal VIII. Acoustic/Vestibular: normal IX. Glossopharyngeal: normal X. Vagus: normal XI. Spinal Accessory: normal XII. Hypoglossal: normal  Motor and other Tests Lhermittes: negative Rhomberg: negative    Right Left Hoffman's: normal normal Clonus: normal normal Babinski: normal normal SLR: negative negative Patrick's Pearlean Brownie): negative negative Toe Walk: normal normal Toe Lift: normal normal Heel Walk: normal normal SI Joint: nontender nontender   Additional Findings:  Patient notes spasticity in his bilateral hands and stiffness in his bilateral lower extremities, right greater than left.  He is able to ambulate using a walker and a strap attached to his right foot.    IMPRESSION:  His spasticity is well controlled, and he feels that he is at a level of pump settings that achieves the optimal outcome. He does report some rigidity in his bilateral lower extremities, right greater than left.  He also stated that he has minimal spasticity in his bilateral hands for which he reports the baclofen pump does not change his symptoms due to the pump being placed too low. The pump was placed on 08/29/14 and is nearing the end of its life cycle. The pump is in need of being replaced.   PLAN: The patient is scheduled for his [employer] baclofen pump to be replaced on September 18, 2020 at [facility]. 3,600 mcg/ml baclofen, 900 mcg/mL clonidine.  The patient receives his medication from [facility] who also does his refills.  We will need to determine where the medication will come from to fill the pump prior to being replaced. The patient had complaints of the pump "flipping over and causing some discomfort while sitting."  We went over the difficulties of moving the pump to a different location.  Therefore, we will attempt to secure the pump at its current location when it is replaced.   Assessment/Plan  # Detail Type Description   1. Assessment Presence of intrathecal baclofen pump (Z97.8).     2. Assessment Multiple sclerosis (G35).     3. Assessment Abnormality of gait (R26.9).       Pain Management Plan Pain Scale: 2/10. Method: Numeric Pain Intensity Scale. Location: back. Onset: 07/09/2020. Duration: varies. Quality: discomforting. Pain management follow-up plan of care: Patient will continue medication management..              Provider:  Danae Orleans. Venetia Maxon MD  07/15/2020 01:28 PM    Dictation edited by: Danae Orleans. Venetia Maxon    CC Providers: [name] [address]  [place]  [address]-   Despina Arias  [address] [place], [place] [address]-               Electronically signed by Danae Orleans Venetia Maxon MD on 07/15/2020 01:28 PM

## 2020-09-13 ENCOUNTER — Ambulatory Visit (INDEPENDENT_AMBULATORY_CARE_PROVIDER_SITE_OTHER): Payer: 59 | Admitting: Psychology

## 2020-09-13 DIAGNOSIS — F331 Major depressive disorder, recurrent, moderate: Secondary | ICD-10-CM | POA: Diagnosis not present

## 2020-09-17 ENCOUNTER — Other Ambulatory Visit: Payer: Self-pay | Admitting: *Deleted

## 2020-09-17 ENCOUNTER — Encounter: Payer: Self-pay | Admitting: Family Medicine

## 2020-09-17 MED ORDER — METHYLPHENIDATE HCL 10 MG PO TABS: 10.0000 mg | ORAL_TABLET | Freq: Four times a day (QID) | ORAL | 0 refills | Status: DC

## 2020-09-17 NOTE — Progress Notes (Signed)
[id number] - [place], [place] - [address] [place] [address] Phone: [phone] Fax: [phone]  [facility] - [place], [place] - [address] [place] [address] Phone: [phone] Fax: [phone]  [facility] - [place], [place] - [address] [facility] [address] [place] [address] Phone: [phone] Fax: [phone]  [facility] - [place], [place] - [address] [place] [address] Phone: [phone] Fax: [phone]      Your procedure is scheduled on 09/20/20.  Report to [facility] Main Entrance "A" at 5:30 A.M., and check in at the Admitting office.  Call this number if you have problems the morning of surgery:  [phone]  Call [phone] if you have any questions prior to your surgery date Monday-Friday 8am-4pm    Remember:  Do not eat oro drink after midnight the night before your surgery     Take these medicines the morning of surgery with A SIP OF WATER: busPIRone (BUSPAR)  clonazePAM (KLONOPIN) CYMBALTA gabapentin (NEURONTIN) omeprazole (PRILOSEC) SYNTHROID tamsulosin (FLOMAX) dalfampridine  AS NEEDED:   baclofen (LIORESAL)  cetirizine (ZYRTEC)  cyclobenzaprine (FLEXERIL) nitroGLYCERIN (NITROSTAT)  As of today, STOP taking any Aspirin (unless otherwise instructed by your surgeon) Aleve, Naproxen, Ibuprofen, Motrin, Advil, Goody's, BC's, all herbal medications, fish oil, and all vitamins.                      Do not wear jewelry            Do not wear lotions, powders, colognes, or deodorant.            Do not shave 48 hours prior to surgery.  Men may shave face and neck.            Do not bring valuables to the hospital.            [facility] is not responsible for any belongings or valuables.  Do NOT Smoke (Tobacco/Vaping) or drink Alcohol 24 hours prior to your procedure If you use a  CPAP at night, you may bring all equipment for your overnight stay.   Contacts, glasses, dentures or bridgework may not be worn into surgery.      For patients admitted to the hospital, discharge time will be determined by your treatment team.   Patients discharged the day of surgery will not be allowed to drive home, and someone needs to stay with them for 24 hours.    Special instructions:   [place]- Preparing For Surgery  Before surgery, you can play an important role. Because skin is not sterile, your skin needs to be as free of germs as possible. You can reduce the number of germs on your skin by washing with CHG (chlorahexidine gluconate) Soap before surgery.  CHG is an antiseptic cleaner which kills germs and bonds with the skin to continue killing germs even after washing.    Oral Hygiene is also important to reduce your risk of infection.  Remember - BRUSH YOUR TEETH THE MORNING OF SURGERY WITH YOUR REGULAR TOOTHPASTE  Please do not use if you have an allergy to CHG or antibacterial soaps. If your skin becomes reddened/irritated stop using the CHG.  Do not shave (including legs and underarms) for at least 48 hours prior to first CHG shower. It is OK to shave your face.  Please follow these instructions carefully.   1. Shower the NIGHT BEFORE SURGERY and the MORNING OF SURGERY with CHG Soap.   2. If you chose to wash your hair, wash your hair first as usual with your normal shampoo.  3. After you shampoo, rinse your hair and body thoroughly to remove the shampoo.  4. Use CHG as you would any other liquid soap. You can apply CHG directly to the skin and wash gently with a scrungie or a clean washcloth.   5. Apply the CHG Soap to your body ONLY FROM THE NECK DOWN.  Do not use on open wounds or open sores. Avoid contact with your eyes, ears, mouth and genitals (private parts). Wash Face and genitals (private parts)  with your normal soap.   6. Wash thoroughly, paying special  attention to the area where your surgery will be performed.  7. Thoroughly rinse your body with warm water from the neck down.  8. DO NOT shower/wash with your normal soap after using and rinsing off the CHG Soap.  9. Pat yourself dry with a CLEAN TOWEL.  10. Wear CLEAN PAJAMAS to bed the night before surgery  11. Place CLEAN SHEETS on your bed the night of your first shower and DO NOT SLEEP WITH PETS.   Day of Surgery: Wear Clean/Comfortable clothing the morning of surgery Do not apply any deodorants/lotions.   Remember to brush your teeth WITH YOUR REGULAR TOOTHPASTE.   Please read over the following fact sheets that you were given.

## 2020-09-17 NOTE — Telephone Encounter (Signed)
Forms printed for provider to sign

## 2020-09-18 ENCOUNTER — Encounter (HOSPITAL_COMMUNITY)
Admission: RE | Admit: 2020-09-18 | Discharge: 2020-09-18 | Disposition: A | Discharge status: Home / Self Care | Payer: Managed Care, Other (non HMO) | Source: Ambulatory Visit | Attending: Neurosurgery | Admitting: Neurosurgery

## 2020-09-18 ENCOUNTER — Other Ambulatory Visit (HOSPITAL_COMMUNITY)
Admission: RE | Admit: 2020-09-18 | Discharge: 2020-09-18 | Disposition: A | Discharge status: Home / Self Care | Payer: Managed Care, Other (non HMO) | Source: Ambulatory Visit | Attending: Neurosurgery | Admitting: Neurosurgery

## 2020-09-18 ENCOUNTER — Encounter (HOSPITAL_COMMUNITY): Payer: Self-pay

## 2020-09-18 ENCOUNTER — Other Ambulatory Visit: Payer: Self-pay

## 2020-09-18 DIAGNOSIS — Z01812 Encounter for preprocedural laboratory examination: Secondary | ICD-10-CM | POA: Insufficient documentation

## 2020-09-18 DIAGNOSIS — Z01818 Encounter for other preprocedural examination: Secondary | ICD-10-CM | POA: Insufficient documentation

## 2020-09-18 DIAGNOSIS — Z20822 Contact with and (suspected) exposure to covid-19: Secondary | ICD-10-CM | POA: Insufficient documentation

## 2020-09-18 HISTORY — DX: Sleep apnea, unspecified: G47.30

## 2020-09-18 LAB — SURGICAL PCR SCREEN
MRSA, PCR: NEGATIVE
Staphylococcus aureus: NEGATIVE

## 2020-09-18 LAB — CBC
HCT: 42.9 % (ref 39.0–52.0)
Hemoglobin: 13.7 g/dL (ref 13.0–17.0)
MCH: 29.7 pg (ref 26.0–34.0)
MCHC: 31.9 g/dL (ref 30.0–36.0)
MCV: 92.9 fL (ref 80.0–100.0)
Platelets: 169 10*3/uL (ref 150–400)
RBC: 4.62 MIL/uL (ref 4.22–5.81)
RDW: 13.5 % (ref 11.5–15.5)
WBC: 8.8 10*3/uL (ref 4.0–10.5)
nRBC: 0 % (ref 0.0–0.2)

## 2020-09-18 LAB — BASIC METABOLIC PANEL
Anion gap: 10 (ref 5–15)
BUN: 14 mg/dL (ref 6–20)
CO2: 26 mmol/L (ref 22–32)
Calcium: 9.1 mg/dL (ref 8.9–10.3)
Chloride: 104 mmol/L (ref 98–111)
Creatinine, Ser: 0.92 mg/dL (ref 0.61–1.24)
GFR, Estimated: >60 mL/min (ref >60)
Glucose, Bld: 100 mg/dL — ABNORMAL HIGH (ref 70–99)
Potassium: 4.1 mmol/L (ref 3.5–5.1)
Sodium: 140 mmol/L (ref 135–145)

## 2020-09-18 LAB — SARS CORONAVIRUS 2 (TAT 6-24 HRS): SARS Coronavirus 2: NEGATIVE

## 2020-09-18 MED ORDER — CHLORHEXIDINE GLUCONATE CLOTH 2 % EX PADS: 6.0000 | MEDICATED_PAD | Freq: Once | CUTANEOUS | Status: DC

## 2020-09-18 NOTE — Progress Notes (Addendum)
PCP - DR   Abner Greenspan        DR SATER   AWARE OF SURGERY   Chest x-ray - 12/16 EKG - 4/21 Stress  1/17 ECHO - 2/21 Cardiac Cath - NA  Sleep Study - YES CPAP - YES  -   [facility] Instructions:STOPPED X10 DAYS  COVID TEST- FOR TODAY   Anesthesia review: HEART HX  Patient denies shortness of breath, fever, cough and chest pain at PAT appointment   All instructions explained to the patient, with a verbal understanding of the material. Patient agrees to go over the instructions while at home for a better understanding. Patient also instructed to self quarantine after being tested for COVID-19. The opportunity to ask questions was provided.

## 2020-09-18 NOTE — Progress Notes (Signed)
PCP - DR Randa Lynn   [facility] Cardiologist - DR [facility]      CLEARANCE     Chest x-ray - 09/18/20 EKG - 09/18/20 Stress Test - 9/21 ECHO - 9/21 Cardiac Cath - 9/21  Sleep Study - NO CPAP - NO  ns: Aspirin Instructions:STOPPED COVID TEST- DID 09/27/20   Anesthesia review: HEART HX  Patient denies shortness of breath, fever, cough and chest pain at PAT appointment   All instructions explained to the patient, with a verbal understanding of the material. Patient agrees to go over the instructions while at home for a better understanding. Patient also instructed to self quarantine after being tested for COVID-19. The opportunity to ask questions was provided.

## 2020-09-19 MED ORDER — DEXTROSE 5 % IV SOLN
3.0000 g | INTRAVENOUS | Status: AC
  Administered 2020-09-20: 3 g via INTRAVENOUS
  Filled 2020-09-19: qty 3

## 2020-09-19 NOTE — Progress Notes (Signed)
Anesthesia Chart Review:  Case: [id number] Date/Time: 09/20/20 0715   Procedure: Right Baclofen pump replacement (Right ) - 3C   Anesthesia type: General   Pre-op diagnosis: Presence of intrathecal baclofen pump   [facility]: [facility] ROOM [facility] / [facility]   Surgeons: Maeola Harman, MD      DISCUSSION: Patient is a 49 year old male scheduled for the above procedure.  History includes never smoker, CAD (NSTEMI, s/p DES pLAD and PTCA D1 04/21/11 and s/p DES posterior AV segment 04/22/11), HTN, HLD, hypothyroidism, multiple sclerosis (diagnosed ~ 1997), OSA (CPAP), fatigue, testosterone deficiency, anxiety, depression, varicose veins (s/p laser ablation right GSV 2013), baclofen pump placement (08/29/2014).  BMI is consistent with obesity.  Notes indicate that he is is a chair most of the day (due to MS) and has a hospital bed at night.   Last cardiology visit with Dr. Excell Seltzer on 04/02/20.  Patient's CAD was felt stable at that time.  Continue aspirin and statin therapy.  Follow-up in 12 months recommended.  He denied chest pain or shortness of breath at PAT RN visit.  He reported aspirin on hold for procedure.  Presurgical COVID-19 test negative on 09/18/2020.  Anesthesia team to evaluate on the day of surgery.   VS: BP 130/72   Pulse 79   Temp 36.7 C (Oral)   Resp 20   Ht 6\' 3"  (1.905 m)   Wt 132 kg Comment: 290  SpO2 99%   BMI 36.37 kg/m   PROVIDERS: , MD is PCP [name], MD is cardiologist Tonny Bollman, MD is neurologist   LABS: Labs reviewed: Acceptable for surgery. A1c 5.6% 07/03/20. (all labs ordered are listed, but only abnormal results are displayed)  Labs Reviewed  BASIC METABOLIC PANEL - Abnormal; Notable for the following components:      Result Value   Glucose, Bld 100 (*)    All other components within normal limits  SURGICAL PCR SCREEN  CBC    EKG: 04/02/20: Normal sinus rhythm, nonspecific ST abnormality.   CV: Echo 01/11/20: IMPRESSIONS  1. Left  ventricular ejection fraction, by visual estimation, is 60 to  65%. The left ventricle has normal function. There is no left ventricular  hypertrophy.  2. The left ventricle has no regional wall motion abnormalities.  3. Global right ventricle has normal systolic function.The right  ventricular size is normal. No increase in right ventricular wall  thickness.  4. Left atrial size was normal.  5. Right atrial size was normal.  6. The mitral valve is normal in structure. Trivial mitral valve  regurgitation. No evidence of mitral stenosis.  7. The tricuspid valve is normal in structure. Tricuspid valve  regurgitation is not demonstrated.  8. The aortic valve is normal in structure. Aortic valve regurgitation is  trivial. No evidence of aortic valve sclerosis or stenosis.  9. The pulmonic valve was not well visualized. Pulmonic valve  regurgitation is not visualized.  10. Unable to determine Pulmonary Artery Systolic Pressure, No TR jet  spectral display.   Nuclear stress test 12/20/15:  Nuclear stress EF: 59%. Akinesis in the basal and mid inferior walls and paradoxical septal motion.  There was no ST segment deviation noted during stress.  Defect 1: There is a medium defect of moderate severity present in the basal inferior, mid inferior and apical inferior location.  The study is normal.  Findings consistent with prior myocardial infarction with peri-infarct ischemia.  This is an intermediate risk study.  The left ventricular ejection  fraction is mildly decreased (45-54%). There is a scar in the basal and mid inferior walls with very mild ischemia in the mid and apical inferior walls (SDS 2).  - Dr. Excell Seltzer reviewed 12/20/15, "Prior infarct noted without significant area of ischemia.Marland KitchenMarland KitchenWill continue current medical program and if progressive symptoms he will notify us..."   Cardiac cath 04/20/20 (in setting of NSTEMI): 1. Critical proximal-to-mid left anterior descending  coronary artery     stenosis involving the left anterior descending coronary artery     diagonal bifurcation.  Successful bifurcation percutaneous coronary     intervention was performed with a drug-eluting stent in the left     anterior descending coronary artery and balloon angioplasty of the     diagonal branch. 2. Severe posterolateral branch stenosis. 3. Nonobstructive left circumflex stenosis. 4. Normal left ventricular function. Staged PCI 04/21/20: 1. Continued patency at the LAD stent site with patency of the     diagonal branch, PTCA site. 2. Successful stenting of the right posterior AV segment branch using     a drug-eluting stent platform.   Past Medical History:  Diagnosis Date  . Acute bronchitis 05/09/2013  . Allergy   . Bronchitis, acute 12/10/2011  . Candidal skin infection 05/05/2017  . Chicken pox as a child  . Constipation 02/20/2017  . Coronary atherosclerosis of native coronary artery    a. NSTEMI 04/20/11: tx with Promus DES to LAD; bifurcation lesion with oDx 90% tx with POBA; b. staged PCI of right post. AV Branch with Promus DES; c. residual at cath 04/21/11:  AV groove CFX 50%, mRCA 30%,; EF 55%  . Depression   . Depression with anxiety   . Dermatitis, contact 12/10/2011  . Fatigue 10/15/2012  . Heart attack (HCC) 04-20-11  . HTN (hypertension)   . Hyperlipidemia   . Hypotestosteronism 11/17/2011  . Hypothyroidism   . Keloid scar   . MS (multiple sclerosis) (HCC)   . Neuromuscular disorder (HCC)    NUMBNESS/TINGLING  . Obesity   . Onychomycosis   . Preventative health care 10/07/2011  . Reflux 10/07/2011  . Sleep apnea   . Sun-damaged skin 02/17/2016  . Testosterone deficiency 01/16/2012  . Tinea pedis   . 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 Visual changes 11/13/2016    Past Surgical History:  Procedure Laterality Date  . CORONARY ANGIOPLASTY WITH STENT PLACEMENT     2012  . PAIN PUMP IMPLANTATION N/A 08/29/2014   Procedure: Baclofen pump  placement;  Surgeon: Maeola Harman, MD;  Location: [facility];  Service: Neurosurgery;  Laterality: N/A;  Baclofen pump placement  . PROGRAMABLE BACLOFEN PUMP REVISION  08/29/14  . right wrist surgery     CTR  . VASCULAR SURGERY     vericose vein in right femoral area  . VASECTOMY    . WISDOM TOOTH EXTRACTION      MEDICATIONS: . aspirin EC 81 MG tablet  . atorvastatin (LIPITOR) 40 MG tablet  . baclofen (LIORESAL) 10 MG tablet  . BACLOFEN IT  . busPIRone (BUSPAR) 10 MG tablet  . cetirizine (ZYRTEC) 10 MG tablet  . clobetasol cream (TEMOVATE) 0.05 %  . clonazePAM (KLONOPIN) 1 MG tablet  . Clonidine HCl POWD  . clotrimazole-betamethasone (LOTRISONE) cream  . cyclobenzaprine (FLEXERIL) 10 MG tablet  . CYMBALTA 60 MG capsule  . dalfampridine 10 MG TB12  . famotidine (PEPCID) 40 MG tablet  . furosemide (LASIX) 20 MG tablet  . gabapentin (NEURONTIN) 600 MG tablet  .  ibuprofen (ADVIL,MOTRIN) 200 MG tablet  . methylphenidate (RITALIN) 10 MG tablet  . mupirocin ointment (BACTROBAN) 2 %  . natalizumab (TYSABRI) 300 MG/15ML injection  . nitroGLYCERIN (NITROSTAT) 0.4 MG SL tablet  . nystatin cream (MYCOSTATIN)  . olmesartan-hydrochlorothiazide (BENICAR HCT) 20-12.5 MG tablet  . omeprazole (PRILOSEC) 40 MG capsule  . potassium chloride SA (KLOR-CON M20) 20 MEQ tablet  . sucralfate (CARAFATE) 1 g tablet  . sucralfate (CARAFATE) 1 GM/10ML suspension  . SYNTHROID 150 MCG tablet  . tamsulosin (FLOMAX) 0.4 MG CAPS capsule  . triamcinolone cream (KENALOG) 0.1 %   No current facility-administered medications for this encounter.   Melene Muller ON 09/20/2020] ceFAZolin (ANCEF) 3 g in dextrose 5 % 50 mL IVPB    Shonna Chock, PA-C Surgical Short Stay/Anesthesiology [facility] Phone [phone] [facility] Phone [phone] 09/19/2020 10:14 AM

## 2020-09-19 NOTE — Anesthesia Preprocedure Evaluation (Addendum)
Anesthesia Evaluation  Patient identified by MRN, date of birth, ID band Patient awake    Reviewed: Allergy & Precautions, NPO status , Patient's Chart, lab work & pertinent test results  History of Anesthesia Complications Negative for: history of anesthetic complications  Airway Mallampati: III  TM Distance: >3 FB Neck ROM: Full    Dental  (+) Dental Advisory Given   Pulmonary shortness of breath, sleep apnea and Continuous Positive Airway Pressure Ventilation ,  Covid-19 Nucleic Acid Test Results Lab Results      Component                Value               Date                      SARSCOV2NAA              NEGATIVE            09/18/2020                SARSCOV2NAA              Not Detected        08/08/2020                SARSCOV2NAA              Not Detected        10/29/2019              breath sounds clear to auscultation       Cardiovascular hypertension, + CAD, + Past MI and + Cardiac Stents   Rhythm:Regular  CAD (NSTEMI, s/p DES pLAD and PTCA D1 04/21/11 and s/p DES posterior AV segment 04/22/11),    Neuro/Psych PSYCHIATRIC DISORDERS Anxiety Depression In 1997, he had left optic neuritis and then had numbness in both feet. MRI was consistent with MS and a lumbar puncture was also performed and CSF was consistent with MS. Initially, he was placed on Copaxone. He had difficulties with compliance and 5 years later switched to Avonex. Unfortunate, he had difficulties tolerating Avonex and also had difficulty tolerating Rebif. In 2004, he started Tysabri but was only able to take 2 doses before was taken off the market. He restarted in 2005 and has been on Tysabri since then with the exception of a 6 month holiday. Tolerates Tysabri well. He has not had any definite exacerbations though his gait has worsened over the past few years. He is JCV antibody negative. He had recent MRI of the brain and spine and there were no changes.      Spasticity history: He was experiencing progressive spasticity in both legs.  He got a baclofen pump in 2013.  Prior to the pump he was having severe tonic spasticity as well as frequent phasic spasms.  These improved.  Due to the spasticity, baclofen was increased in 2016 and 2017 but he became weaker.  I began to see him again and we added clonidine to the baclofen and cut his dose and added nighttime boluses.  He did better with those settings.  Neuromuscular disease    GI/Hepatic Neg liver ROS, GERD  Medicated and Controlled,  Endo/Other  Hypothyroidism   Renal/GU negative Renal ROSLab Results      Component                Value  Date                      CREATININE               0.92                09/18/2020           Lab Results      Component                Value               Date                      K                        4.1                 09/18/2020                Musculoskeletal   Abdominal   Peds  Hematology negative hematology ROS (+) Lab Results      Component                Value               Date                      WBC                      8.8                 09/18/2020                HGB                      13.7                09/18/2020                HCT                      42.9                09/18/2020                MCV                      92.9                09/18/2020                PLT                      169                 09/18/2020              Anesthesia Other Findings 1. Left ventricular ejection fraction, by visual estimation, is 60 to  65%. The left ventricle has normal function. There is no left ventricular  hypertrophy.  2. The left ventricle has no regional wall motion abnormalities.  3. Global right ventricle has normal systolic function.The right  ventricular size is normal. No increase in right ventricular wall  thickness.  4. Left atrial size was normal.  5. Right atrial size was  normal.  6. The  mitral valve is normal in structure. Trivial mitral valve  regurgitation. No evidence of mitral stenosis.  7. The tricuspid valve is normal in structure. Tricuspid valve  regurgitation is not demonstrated.  8. The aortic valve is normal in structure. Aortic valve regurgitation is  trivial. No evidence of aortic valve sclerosis or stenosis.  9. The pulmonic valve was not well visualized. Pulmonic valve  regurgitation is not visualized.  10. Unable to determine Pulmonary Artery Systolic Pressure, No TR jet  spectral display.   Reproductive/Obstetrics                            Anesthesia Physical Anesthesia Plan  ASA: III  Anesthesia Plan: General   Post-op Pain Management:    Induction: Intravenous  PONV Risk Score and Plan: 2 and Ondansetron and Dexamethasone  Airway Management Planned: Oral ETT  Additional Equipment: None  Intra-op Plan:   Post-operative Plan: Extubation in OR  Informed Consent: I have reviewed the patients History and Physical, chart, labs and discussed the procedure including the risks, benefits and alternatives for the proposed anesthesia with the patient or authorized representative who has indicated his/her understanding and acceptance.     Dental advisory given  Plan Discussed with: CRNA and Surgeon  Anesthesia Plan Comments: (PAT note written 09/19/2020 by Shonna Chock, PA-C. )       Anesthesia Quick Evaluation

## 2020-09-20 ENCOUNTER — Ambulatory Visit (HOSPITAL_COMMUNITY): Payer: Managed Care, Other (non HMO) | Admitting: Vascular Surgery

## 2020-09-20 ENCOUNTER — Ambulatory Visit (HOSPITAL_COMMUNITY)
Admission: RE | Admit: 2020-09-20 | Discharge: 2020-09-20 | Disposition: A | Discharge status: Home / Self Care | Payer: Managed Care, Other (non HMO) | Attending: Neurosurgery | Admitting: Neurosurgery

## 2020-09-20 ENCOUNTER — Other Ambulatory Visit: Payer: Self-pay | Admitting: Family Medicine

## 2020-09-20 ENCOUNTER — Other Ambulatory Visit: Payer: Self-pay

## 2020-09-20 ENCOUNTER — Encounter: Payer: Self-pay | Admitting: Family Medicine

## 2020-09-20 ENCOUNTER — Encounter (HOSPITAL_COMMUNITY): Payer: Self-pay | Admitting: Neurosurgery

## 2020-09-20 ENCOUNTER — Encounter (HOSPITAL_COMMUNITY)
Admission: RE | Disposition: A | Discharge status: Home / Self Care | Payer: Self-pay | Source: Home / Self Care | Attending: Neurosurgery

## 2020-09-20 DIAGNOSIS — Z451 Encounter for adjustment and management of infusion pump: Secondary | ICD-10-CM | POA: Insufficient documentation

## 2020-09-20 DIAGNOSIS — Z7982 Long term (current) use of aspirin: Secondary | ICD-10-CM | POA: Diagnosis not present

## 2020-09-20 DIAGNOSIS — Z79899 Other long term (current) drug therapy: Secondary | ICD-10-CM | POA: Diagnosis not present

## 2020-09-20 DIAGNOSIS — I252 Old myocardial infarction: Secondary | ICD-10-CM | POA: Diagnosis not present

## 2020-09-20 DIAGNOSIS — G35 Multiple sclerosis: Secondary | ICD-10-CM | POA: Insufficient documentation

## 2020-09-20 DIAGNOSIS — E039 Hypothyroidism, unspecified: Secondary | ICD-10-CM | POA: Diagnosis not present

## 2020-09-20 DIAGNOSIS — Z79890 Hormone replacement therapy (postmenopausal): Secondary | ICD-10-CM | POA: Insufficient documentation

## 2020-09-20 DIAGNOSIS — R269 Unspecified abnormalities of gait and mobility: Secondary | ICD-10-CM | POA: Insufficient documentation

## 2020-09-20 DIAGNOSIS — F32A Depression, unspecified: Secondary | ICD-10-CM | POA: Insufficient documentation

## 2020-09-20 DIAGNOSIS — I10 Essential (primary) hypertension: Secondary | ICD-10-CM | POA: Insufficient documentation

## 2020-09-20 HISTORY — PX: PAIN PUMP IMPLANTATION: SHX330

## 2020-09-20 SURGERY — PAIN PUMP INSERTION
Anesthesia: General | Site: Abdomen | Laterality: Right

## 2020-09-20 MED ORDER — ACETAMINOPHEN 10 MG/ML IV SOLN: 1000.0000 mg | Freq: Once | INTRAVENOUS | Status: DC | PRN

## 2020-09-20 MED ORDER — ONDANSETRON HCL 4 MG/2ML IJ SOLN
INTRAMUSCULAR | Status: AC
  Filled 2020-09-20: qty 2

## 2020-09-20 MED ORDER — FENTANYL CITRATE (PF) 100 MCG/2ML IJ SOLN: 25.0000 ug | INTRAMUSCULAR | Status: DC | PRN

## 2020-09-20 MED ORDER — VANCOMYCIN HCL 1000 MG IV SOLR
INTRAVENOUS | Status: DC | PRN
  Administered 2020-09-20: 1000 mg via INTRAVENOUS

## 2020-09-20 MED ORDER — GLYCOPYRROLATE PF 0.2 MG/ML IJ SOSY
PREFILLED_SYRINGE | INTRAMUSCULAR | Status: DC | PRN
  Administered 2020-09-20 (×2): .1 mg via INTRAVENOUS

## 2020-09-20 MED ORDER — ONDANSETRON HCL 4 MG/2ML IJ SOLN
INTRAMUSCULAR | Status: DC | PRN
  Administered 2020-09-20: 4 mg via INTRAVENOUS

## 2020-09-20 MED ORDER — OXYCODONE HCL 5 MG PO TABS: 5.0000 mg | ORAL_TABLET | Freq: Once | ORAL | Status: DC | PRN

## 2020-09-20 MED ORDER — LACTATED RINGERS IV SOLN: INTRAVENOUS | Status: DC

## 2020-09-20 MED ORDER — ORAL CARE MOUTH RINSE: 15.0000 mL | Freq: Once | OROMUCOSAL | Status: AC

## 2020-09-20 MED ORDER — NYSTATIN 100000 UNIT/GM EX CREA
1.0000 | TOPICAL_CREAM | CUTANEOUS | 2 refills | Status: DC
Start: 2020-09-20 — End: 2022-05-20

## 2020-09-20 MED ORDER — LIDOCAINE 2% (20 MG/ML) 5 ML SYRINGE
INTRAMUSCULAR | Status: DC | PRN
  Administered 2020-09-20: 60 mg via INTRAVENOUS

## 2020-09-20 MED ORDER — ACETAMINOPHEN 160 MG/5ML PO SOLN: 1000.0000 mg | Freq: Once | ORAL | Status: DC | PRN

## 2020-09-20 MED ORDER — PROPOFOL 10 MG/ML IV BOLUS
INTRAVENOUS | Status: AC
  Filled 2020-09-20: qty 20

## 2020-09-20 MED ORDER — ROCURONIUM BROMIDE 10 MG/ML (PF) SYRINGE
PREFILLED_SYRINGE | INTRAVENOUS | Status: AC
  Filled 2020-09-20: qty 10

## 2020-09-20 MED ORDER — FENTANYL CITRATE (PF) 250 MCG/5ML IJ SOLN
INTRAMUSCULAR | Status: AC
Start: 2020-09-20 — End: ?
  Filled 2020-09-20: qty 5

## 2020-09-20 MED ORDER — DEXAMETHASONE SODIUM PHOSPHATE 10 MG/ML IJ SOLN
INTRAMUSCULAR | Status: DC | PRN
  Administered 2020-09-20: 10 mg via INTRAVENOUS

## 2020-09-20 MED ORDER — LIDOCAINE-EPINEPHRINE 1 %-1:100000 IJ SOLN
INTRAMUSCULAR | Status: AC
  Filled 2020-09-20: qty 1

## 2020-09-20 MED ORDER — VANCOMYCIN HCL 1000 MG IV SOLR
INTRAVENOUS | Status: DC | PRN
  Administered 2020-09-20: 1000 mg

## 2020-09-20 MED ORDER — OXYCODONE HCL 5 MG/5ML PO SOLN: 5.0000 mg | Freq: Once | ORAL | Status: DC | PRN

## 2020-09-20 MED ORDER — CHLORHEXIDINE GLUCONATE 0.12 % MT SOLN
15.0000 mL | Freq: Once | OROMUCOSAL | Status: AC
  Administered 2020-09-20: 15 mL via OROMUCOSAL
  Filled 2020-09-20: qty 15

## 2020-09-20 MED ORDER — CYCLOBENZAPRINE HCL 10 MG PO TABS: 10.0000 mg | ORAL_TABLET | Freq: Three times a day (TID) | ORAL | Status: DC | PRN

## 2020-09-20 MED ORDER — PHENYLEPHRINE HCL (PRESSORS) 10 MG/ML IV SOLN
INTRAVENOUS | Status: DC | PRN
  Administered 2020-09-20 (×7): 80 ug via INTRAVENOUS

## 2020-09-20 MED ORDER — DEXAMETHASONE SODIUM PHOSPHATE 10 MG/ML IJ SOLN
INTRAMUSCULAR | Status: AC
  Filled 2020-09-20: qty 1

## 2020-09-20 MED ORDER — PHENYLEPHRINE 40 MCG/ML (10ML) SYRINGE FOR IV PUSH (FOR BLOOD PRESSURE SUPPORT)
PREFILLED_SYRINGE | INTRAVENOUS | Status: AC
  Filled 2020-09-20: qty 10

## 2020-09-20 MED ORDER — NON FORMULARY
Status: DC | PRN
  Administered 2020-09-20: 40 mL

## 2020-09-20 MED ORDER — SODIUM CHLORIDE (PF) 0.9 % IJ SOLN
INTRAMUSCULAR | Status: DC | PRN
  Administered 2020-09-20: 10 mL

## 2020-09-20 MED ORDER — VANCOMYCIN HCL 1000 MG IV SOLR
INTRAVENOUS | Status: AC
  Filled 2020-09-20: qty 1000

## 2020-09-20 MED ORDER — ACETAMINOPHEN 500 MG PO TABS: 1000.0000 mg | ORAL_TABLET | Freq: Once | ORAL | Status: DC | PRN

## 2020-09-20 MED ORDER — ROCURONIUM BROMIDE 10 MG/ML (PF) SYRINGE
PREFILLED_SYRINGE | INTRAVENOUS | Status: DC | PRN
  Administered 2020-09-20: 20 mg via INTRAVENOUS
  Administered 2020-09-20: 60 mg via INTRAVENOUS

## 2020-09-20 MED ORDER — BUPIVACAINE HCL (PF) 0.5 % IJ SOLN
INTRAMUSCULAR | Status: DC | PRN
  Administered 2020-09-20: 10 mL

## 2020-09-20 MED ORDER — SUGAMMADEX SODIUM 200 MG/2ML IV SOLN
INTRAVENOUS | Status: DC | PRN
  Administered 2020-09-20: 200 mg via INTRAVENOUS

## 2020-09-20 MED ORDER — PROPOFOL 10 MG/ML IV BOLUS
INTRAVENOUS | Status: DC | PRN
  Administered 2020-09-20: 110 mg via INTRAVENOUS
  Administered 2020-09-20: 40 mg via INTRAVENOUS

## 2020-09-20 MED ORDER — MIDAZOLAM HCL 2 MG/2ML IJ SOLN
INTRAMUSCULAR | Status: AC
  Filled 2020-09-20: qty 2

## 2020-09-20 MED ORDER — MIDAZOLAM HCL 5 MG/5ML IJ SOLN
INTRAMUSCULAR | Status: DC | PRN
  Administered 2020-09-20: 2 mg via INTRAVENOUS

## 2020-09-20 MED ORDER — LIDOCAINE-EPINEPHRINE 1 %-1:100000 IJ SOLN
INTRAMUSCULAR | Status: DC | PRN
  Administered 2020-09-20: 10 mL

## 2020-09-20 MED ORDER — 0.9 % SODIUM CHLORIDE (POUR BTL) OPTIME
TOPICAL | Status: DC | PRN
  Administered 2020-09-20: 1000 mL

## 2020-09-20 MED ORDER — FENTANYL CITRATE (PF) 100 MCG/2ML IJ SOLN
INTRAMUSCULAR | Status: DC | PRN
Start: 2020-09-20 — End: 2020-09-20
  Administered 2020-09-20: 100 ug via INTRAVENOUS

## 2020-09-20 MED ORDER — BUPIVACAINE HCL (PF) 0.5 % IJ SOLN
INTRAMUSCULAR | Status: AC
  Filled 2020-09-20: qty 30

## 2020-09-20 MED ORDER — HYDROCODONE-ACETAMINOPHEN 5-325 MG PO TABS
1.0000 | ORAL_TABLET | ORAL | 0 refills | Status: DC | PRN
Start: 2020-09-20 — End: 2020-12-06

## 2020-09-20 MED ORDER — EPHEDRINE SULFATE-NACL 50-0.9 MG/10ML-% IV SOSY
PREFILLED_SYRINGE | INTRAVENOUS | Status: DC | PRN
  Administered 2020-09-20 (×2): 10 mg via INTRAVENOUS

## 2020-09-20 SURGICAL SUPPLY — 72 items
BENZOIN TINCTURE PRP APPL 2/3 (GAUZE/BANDAGES/DRESSINGS) ×3 IMPLANT
BLADE CLIPPER SURG (BLADE) ×3 IMPLANT
BLADE SURG 10 STRL SS (BLADE) ×6 IMPLANT
BLADE SURG 15 STRL LF DISP TIS (BLADE) ×1 IMPLANT
BLADE SURG 15 STRL SS (BLADE) ×2
BOOT SUTURE AID YELLOW STND (SUTURE) ×3 IMPLANT
CABLE BIPOLOR RESECTION CORD (MISCELLANEOUS) ×3 IMPLANT
CANISTER SUCT 3000ML PPV (MISCELLANEOUS) ×3 IMPLANT
CARTRIDGE OIL MAESTRO DRILL (MISCELLANEOUS) ×1 IMPLANT
CLOSURE WOUND 1/2 X4 (GAUZE/BANDAGES/DRESSINGS) ×1
CNTNR URN SCR LID CUP LEK RST (MISCELLANEOUS) ×2 IMPLANT
CONT SPEC 4OZ STRL OR WHT (MISCELLANEOUS) ×4
COVER MAYO STAND STRL (DRAPES) ×3 IMPLANT
COVER WAND RF STERILE (DRAPES) ×3 IMPLANT
DECANTER SPIKE VIAL GLASS SM (MISCELLANEOUS) ×3 IMPLANT
DERMABOND ADVANCED (GAUZE/BANDAGES/DRESSINGS) ×2
DERMABOND ADVANCED .7 DNX12 (GAUZE/BANDAGES/DRESSINGS) ×1 IMPLANT
DIFFUSER DRILL AIR PNEUMATIC (MISCELLANEOUS) ×3 IMPLANT
DRAPE C-ARM 42X72 X-RAY (DRAPES) ×3 IMPLANT
DRAPE INCISE IOBAN 85X60 (DRAPES) ×3 IMPLANT
DRAPE LAPAROTOMY 100X72X124 (DRAPES) ×3 IMPLANT
DRSG OPSITE POSTOP 4X6 (GAUZE/BANDAGES/DRESSINGS) ×3 IMPLANT
DRSG PAD ABDOMINAL 8X10 ST (GAUZE/BANDAGES/DRESSINGS) IMPLANT
ELECT REM PT RETURN 9FT ADLT (ELECTROSURGICAL) ×3
ELECTRODE REM PT RTRN 9FT ADLT (ELECTROSURGICAL) ×1 IMPLANT
GAUZE 4X4 16PLY RFD (DISPOSABLE) ×3 IMPLANT
GAUZE SPONGE 4X4 12PLY STRL (GAUZE/BANDAGES/DRESSINGS) IMPLANT
GLOVE BIO SURGEON STRL SZ8 (GLOVE) ×3 IMPLANT
GLOVE BIOGEL PI IND STRL 8 (GLOVE) ×1 IMPLANT
GLOVE BIOGEL PI IND STRL 8.5 (GLOVE) ×1 IMPLANT
GLOVE BIOGEL PI INDICATOR 8 (GLOVE) ×2
GLOVE BIOGEL PI INDICATOR 8.5 (GLOVE) ×2
GLOVE ECLIPSE 8.0 STRL XLNG CF (GLOVE) ×3 IMPLANT
GLOVE EXAM NITRILE XL STR (GLOVE) IMPLANT
GOWN STRL REUS W/ TWL LRG LVL3 (GOWN DISPOSABLE) IMPLANT
GOWN STRL REUS W/ TWL XL LVL3 (GOWN DISPOSABLE) IMPLANT
GOWN STRL REUS W/TWL 2XL LVL3 (GOWN DISPOSABLE) IMPLANT
GOWN STRL REUS W/TWL LRG LVL3 (GOWN DISPOSABLE)
GOWN STRL REUS W/TWL XL LVL3 (GOWN DISPOSABLE)
KIT BASIN OR (CUSTOM PROCEDURE TRAY) ×3 IMPLANT
KIT REFILL (MISCELLANEOUS) ×2
KIT REFILL CATH SYNCHROMED II (MISCELLANEOUS) ×1 IMPLANT
KIT TURNOVER KIT B (KITS) ×3 IMPLANT
NEEDLE HYPO 18GX1.5 BLUNT FILL (NEEDLE) IMPLANT
NEEDLE HYPO 25X1 1.5 SAFETY (NEEDLE) ×3 IMPLANT
NS IRRIG 1000ML POUR BTL (IV SOLUTION) ×3 IMPLANT
OIL CARTRIDGE MAESTRO DRILL (MISCELLANEOUS) ×3
PACK EENT II TURBAN DRAPE (CUSTOM PROCEDURE TRAY) ×3 IMPLANT
PATTIES SURGICAL .5 X.5 (GAUZE/BANDAGES/DRESSINGS) IMPLANT
PATTIES SURGICAL 1X1 (DISPOSABLE) IMPLANT
PENCIL BUTTON HOLSTER BLD 10FT (ELECTRODE) ×3 IMPLANT
PUMP SYNCHROMED II 40ML RESVR (Neuro Prosthesis/Implant) ×3 IMPLANT
SPONGE LAP 4X18 RFD (DISPOSABLE) ×3 IMPLANT
SPONGE SURGIFOAM ABS GEL SZ50 (HEMOSTASIS) IMPLANT
STAPLER SKIN PROX WIDE 3.9 (STAPLE) ×3 IMPLANT
STRIP CLOSURE SKIN 1/2X4 (GAUZE/BANDAGES/DRESSINGS) ×2 IMPLANT
SUT BONE WAX W31G (SUTURE) IMPLANT
SUT SILK 0 TIES 10X30 (SUTURE) ×3 IMPLANT
SUT SILK 2 0 PERMA HAND 18 BK (SUTURE) ×9 IMPLANT
SUT SILK 2 0 TIES 10X30 (SUTURE) IMPLANT
SUT VIC AB 0 CT1 18XCR BRD8 (SUTURE) ×2 IMPLANT
SUT VIC AB 0 CT1 8-18 (SUTURE) ×4
SUT VIC AB 2-0 CP2 18 (SUTURE) ×6 IMPLANT
SUT VIC AB 2-0 CT1 18 (SUTURE) ×3 IMPLANT
SUT VIC AB 3-0 SH 8-18 (SUTURE) ×12 IMPLANT
SYR 3ML LL SCALE MARK (SYRINGE) IMPLANT
SYR CONTROL 10ML LL (SYRINGE) ×3 IMPLANT
TOWEL GREEN STERILE (TOWEL DISPOSABLE) ×3 IMPLANT
TOWEL GREEN STERILE FF (TOWEL DISPOSABLE) ×3 IMPLANT
TUBE CONNECTING 12'X1/4 (SUCTIONS) ×1
TUBE CONNECTING 12X1/4 (SUCTIONS) ×2 IMPLANT
WATER STERILE IRR 1000ML POUR (IV SOLUTION) ×3 IMPLANT

## 2020-09-20 NOTE — Op Note (Signed)
09/20/2020  8:41 AM  PATIENT:  Keith Morrow  49 y.o. male  PRE-OPERATIVE DIAGNOSIS:  Presence of depleted intrathecal baclofen pump, Multiple Sclerosis, pasticity  POST-OPERATIVE DIAGNOSIS:  Presence of depleted intrathecal baclofen pump, Multiple Sclerosis, pasticity  PROCEDURE:  Procedure(s) with comments: Right Baclofen pump replacement (Right) - Right Baclofen pump replacement  SURGEON:  Surgeon(s) and Role:    * Prakriti Carignan, MD - Primary  PHYSICIAN ASSISTANT:   ASSISTANTS: Poteat, RN   ANESTHESIA:   general  EBL:  Minimal  BLOOD ADMINISTERED:none  DRAINS: none   LOCAL MEDICATIONS USED:  MARCAINE    and LIDOCAINE   SPECIMEN:  No Specimen  DISPOSITION OF SPECIMEN:  N/A  COUNTS:  YES  TOURNIQUET:  * No tourniquets in log *  DICTATION: Patient has implanted baclofen pump for spasticity secondary to spasticity due to multiple sclerosis, which is now depleted.  It was elected for patient to undergo baclofen pump revision.  PROCEDURE: Patient was brought to the operating room and given general anesthesia.  Right lower quadrant was prepped with betadine scrub and Duraprep.  Area of planned incision was infiltrated with marcaine.  Prior incision was reopened and the old baclofen pump was externalized.  Catheter was aspirated and fluid flowed easily, then catheter was disconnected. There was spontaneous flow of CSF.  The new baclofen pump was filled and connected to the catheter.  3, 2-0 silk sutures were placed in the pocket. The new pump was then internalized. Wound was irrigated with vancomycin. Then irrigated once more.  Incision was closed with 0, 2-0 Vicryl and 3-0 vicryl sutures and dressed with a sterile occlusive dressing.  Counts were correct at the end of the case.  Drug used:  Lioresal 1,800 mcg/ml; Clonidine 420 mcg/ml.  Simple continuous infusion at 581.7 mcg/ day.   PLAN OF CARE: Discharge to home after PACU  PATIENT DISPOSITION:  PACU - hemodynamically  stable.   Delay start of Pharmacological VTE agent (>24hrs) due to surgical blood loss or risk of bleeding: yes  

## 2020-09-20 NOTE — Anesthesia Procedure Notes (Signed)
Procedure Name: Intubation Date/Time: 09/20/2020 7:44 AM Performed by: Lavell Luster, CRNA Pre-anesthesia Checklist: Patient identified, Emergency Drugs available, Suction available, Patient being monitored and Timeout performed Patient Re-evaluated:Patient Re-evaluated prior to induction Oxygen Delivery Method: Circle system utilized Preoxygenation: Pre-oxygenation with 100% oxygen Induction Type: IV induction Ventilation: Mask ventilation without difficulty Laryngoscope Size: Mac and 4 Grade View: Grade IV Tube type: Oral Tube size: 7.5 mm Number of attempts: 1 Airway Equipment and Method: Stylet Placement Confirmation: ETT inserted through vocal cords under direct vision,  positive ETCO2 and breath sounds checked- equal and bilateral Secured at: 21 cm Tube secured with: Tape Dental Injury: Teeth and Oropharynx as per pre-operative assessment

## 2020-09-20 NOTE — Interval H&P Note (Signed)
History and Physical Interval Note:  09/20/2020 7:33 AM  Keith Morrow  has presented today for surgery, with the diagnosis of Presence of intrathecal baclofen pump.  The various methods of treatment have been discussed with the patient and family. After consideration of risks, benefits and other options for treatment, the patient has consented to  Procedure(s) with comments: Right Baclofen pump replacement (Right) - Right Baclofen pump replacement as a surgical intervention.  The patient's history has been reviewed, patient examined, no change in status, stable for surgery.  I have reviewed the patient's chart and labs.  Questions were answered to the patient's satisfaction.     Dorian Heckle

## 2020-09-20 NOTE — Transfer of Care (Signed)
Immediate Anesthesia Transfer of Care Note  Patient: Keith Morrow  Procedure(s) Performed: Right Baclofen pump replacement (Right Abdomen)  Patient Location: PACU  Anesthesia Type:General  Level of Consciousness: awake, alert  and oriented  Airway & Oxygen Therapy: Patient connected to face mask oxygen  Post-op Assessment: Post -op Vital signs reviewed and stable  Post vital signs: stable  Last Vitals:  Vitals Value Taken Time  BP 129/62 09/20/20 0844  Temp    Pulse 105 09/20/20 0846  Resp 19 09/20/20 0846  SpO2 94 % 09/20/20 0846  Vitals shown include unvalidated device data.  Last Pain:  Vitals:   09/20/20 0644  TempSrc:   PainSc: 0-No pain         Complications: No complications documented.

## 2020-09-20 NOTE — Discharge Summary (Signed)
Physician Discharge Summary  Patient ID: Keith Morrow MRN: [id number] DOB/AGE: 12-09-1970 49 y.o.  Admit date: 09/20/2020 Discharge date: 09/20/2020  Admission Diagnoses:Spasticity, Multiple Sclerosis, Depleted baclofen pump  Discharge Diagnoses: Spasticity, Multiple Sclerosis, Depleted baclofen pump Active Problems:   * No active hospital problems. *   Discharged Condition: good  Hospital Course: Patient underwent uncomplicated revision of baclofen pump.  He did well and was discharged home from the recovery room.  Consults: None  Significant Diagnostic Studies: None  Treatments: surgery: Patient underwent uncomplicated revision of baclofen pump  Discharge Exam: Blood pressure 127/71, pulse (!) 104, temperature (!) 97.1 F (36.2 C), resp. rate 20, height 6\' 3"  (1.905 m), weight 131.5 kg, SpO2 96 %. Neurologic: Alert and oriented X 3, normal strength and tone. Normal symmetric reflexes. Normal coordination and gait Wound:CDI  Disposition: Home  Discharge Instructions     Remove dressing in 72 hours   Complete by: As directed    Diet - low sodium heart healthy   Complete by: As directed    Increase activity slowly   Complete by: As directed      Allergies as of 09/20/2020   No Known Allergies     Medication List    TAKE these medications   aspirin EC 81 MG tablet Take 81 mg by mouth daily.   atorvastatin 40 MG tablet Commonly known as: LIPITOR Take 1 tablet (40 mg total) by mouth daily at 6 PM.   BACLOFEN IT 1 Dose by Intrathecal route as directed.   baclofen 10 MG tablet Commonly known as: LIORESAL Take 1 tablet (10 mg total) by mouth 3 (three) times daily as needed for muscle spasms.   busPIRone 10 MG tablet Commonly known as: BUSPAR Take 1 tablet (10 mg total) by mouth 3 (three) times daily.   cetirizine 10 MG tablet Commonly known as: ZYRTEC Take 10 mg by mouth daily as needed for allergies.   clobetasol cream 0.05 % Commonly known as:  TEMOVATE Apply 1 application topically 2 (two) times daily as needed (skin).   clonazePAM 1 MG tablet Commonly known as: KLONOPIN Take 1 tablet (1 mg total) by mouth 4 (four) times daily.   Clonidine HCl Powd by Does not apply route. 720mg /ml IT Baclofen. Total dose 157.89mg /day. Last pump refill 06/02/16   clotrimazole-betamethasone cream Commonly known as: LOTRISONE Apply 1 application topically 2 (two) times daily as needed (anti-fungal).   cyclobenzaprine 10 MG tablet Commonly known as: FLEXERIL TAKE 1 TABLET BY MOUTH THREE TIMES A DAY AS NEEDED FOR MUSCLE SPASMS What changed: See the new instructions.   Cymbalta 60 MG capsule Generic drug: DULoxetine Take 1 capsule (60 mg total) by mouth 2 (two) times daily.   dalfampridine 10 MG Tb12 TAKE 1 TABLET (10 MG TOTAL) EVERY 12 HOURS What changed:   how much to take  how to take this  when to take this  additional instructions   famotidine 40 MG tablet Commonly known as: PEPCID TAKE 1 TABLET BY MOUTH EVERYDAY AT BEDTIME What changed: See the new instructions.   furosemide 20 MG tablet Commonly known as: LASIX 1 ta po daily x 5 days and then as needed for SOB, weight gain>3#, pedal edema and can take a second tab daily as needed   gabapentin 600 MG tablet Commonly known as: Neurontin Take 1 tablet (600 mg total) by mouth 4 (four) times daily.   HYDROcodone-acetaminophen 5-325 MG tablet Commonly known as: NORCO/VICODIN Take 1 tablet by mouth every 4 (four)  hours as needed for moderate pain.   ibuprofen 200 MG tablet Commonly known as: ADVIL Take 200 mg by mouth every 6 (six) hours as needed for moderate pain.   methylphenidate 10 MG tablet Commonly known as: RITALIN Take 1 tablet (10 mg total) by mouth 4 (four) times daily.   mupirocin ointment 2 % Commonly known as: Bactroban Apply 1 application topically 2 (two) times daily as needed.   nitroGLYCERIN 0.4 MG SL tablet Commonly known as: NITROSTAT PLACE 1  TABLET UNDER THE TOUNGE EVERY 5 MINUTES AS NEEDED FOR CHEST PAIN (X 3 DOSES) What changed: See the new instructions.   nystatin cream Commonly known as: MYCOSTATIN Apply 1 application topically as directed.   olmesartan-hydrochlorothiazide 20-12.5 MG tablet Commonly known as: BENICAR HCT TAKE 1 TABLET BY MOUTH EVERY DAY   omeprazole 40 MG capsule Commonly known as: PRILOSEC TAKE 1 CAPSULE BY MOUTH EVERY DAY What changed: how much to take   potassium chloride SA 20 MEQ tablet Commonly known as: Klor-Con M20 TAKE 1 TABLET BY MOUTH EVERY DAY AS NEEDED WHEN TAKING A SECOND LASIX TAB ANY GIVEN DAY   sucralfate 1 GM/10ML suspension Commonly known as: Carafate Take 10 mLs (1 g total) by mouth 4 (four) times daily -  with meals and at bedtime. Take liquid or tablets but not both What changed: Another medication with the same name was changed. Make sure you understand how and when to take each.   sucralfate 1 g tablet Commonly known as: CARAFATE TAKE 1 TABLET BY MOUTH 4 TIMES DAILY - WITH MEALS AND AT BEDTIME. TAKE TABLET OR LIQUID BUT NOT BOTH What changed: See the new instructions.   Synthroid 150 MCG tablet Generic drug: levothyroxine TAKE 1 TABLET BY MOUTH EVERY DAY BEFORE BREAKFAST What changed: See the new instructions.   tamsulosin 0.4 MG Caps capsule Commonly known as: FLOMAX TAKE 1 CAPSULE BY MOUTH EVERY DAY What changed: how much to take   triamcinolone cream 0.1 % Commonly known as: KENALOG Apply 1 application topically 2 (two) times daily as needed.   Tysabri 300 MG/15ML injection Generic drug: natalizumab Inject 300 mg into the vein every 28 (twenty-eight) days.        Signed: Dorian Heckle, MD 09/20/2020, 9:07 AM

## 2020-09-20 NOTE — Telephone Encounter (Signed)
LVM for Helyn Numbers ([employer] rep) who dropped off PTM today for pt. Asked her to call prior to 5pm today since we are closed tomorrow. Pt infusion scheduled for 09/26/20 around 11am per Freddi Starr, RN. Pt asking if I can set this up. Advised I do not have experience with this. Wanting to know if she can come day of infusion to get this set up. Provided office #

## 2020-09-20 NOTE — Brief Op Note (Signed)
09/20/2020  8:41 AM  PATIENT:  Keith Morrow  49 y.o. male  PRE-OPERATIVE DIAGNOSIS:  Presence of depleted intrathecal baclofen pump, Multiple Sclerosis, pasticity  POST-OPERATIVE DIAGNOSIS:  Presence of depleted intrathecal baclofen pump, Multiple Sclerosis, pasticity  PROCEDURE:  Procedure(s) with comments: Right Baclofen pump replacement (Right) - Right Baclofen pump replacement  SURGEON:  Surgeon(s) and Role:    Maeola Harman, MD - Primary  PHYSICIAN ASSISTANT:   ASSISTANTS: Poteat, RN   ANESTHESIA:   general  EBL:  Minimal  BLOOD ADMINISTERED:none  DRAINS: none   LOCAL MEDICATIONS USED:  MARCAINE    and LIDOCAINE   SPECIMEN:  No Specimen  DISPOSITION OF SPECIMEN:  N/A  COUNTS:  YES  TOURNIQUET:  * No tourniquets in log *  DICTATION: Patient has implanted baclofen pump for spasticity secondary to spasticity due to multiple sclerosis, which is now depleted.  It was elected for patient to undergo baclofen pump revision.  PROCEDURE: Patient was brought to the operating room and given general anesthesia.  Right lower quadrant was prepped with betadine scrub and Duraprep.  Area of planned incision was infiltrated with marcaine.  Prior incision was reopened and the old baclofen pump was externalized.  Catheter was aspirated and fluid flowed easily, then catheter was disconnected. There was spontaneous flow of CSF.  The new baclofen pump was filled and connected to the catheter.  3, 2-0 silk sutures were placed in the pocket. The new pump was then internalized. Wound was irrigated with vancomycin. Then irrigated once more.  Incision was closed with 0, 2-0 Vicryl and 3-0 vicryl sutures and dressed with a sterile occlusive dressing.  Counts were correct at the end of the case.  Drug used:  Lioresal 1,800 mcg/ml; Clonidine 420 mcg/ml.  Simple continuous infusion at 581.7 mcg/ day.   PLAN OF CARE: Discharge to home after PACU  PATIENT DISPOSITION:  PACU - hemodynamically  stable.   Delay start of Pharmacological VTE agent (>24hrs) due to surgical blood loss or risk of bleeding: yes

## 2020-09-21 NOTE — Anesthesia Postprocedure Evaluation (Signed)
Anesthesia Post Note  Patient: Keith Morrow  Procedure(s) Performed: Right Baclofen pump replacement (Right Abdomen)     Patient location during evaluation: PACU Anesthesia Type: General Level of consciousness: awake and alert Pain management: pain level controlled Vital Signs Assessment: post-procedure vital signs reviewed and stable Respiratory status: spontaneous breathing, nonlabored ventilation, respiratory function stable and patient connected to nasal cannula oxygen Cardiovascular status: blood pressure returned to baseline and stable Postop Assessment: no apparent nausea or vomiting Anesthetic complications: no   No complications documented.  Last Vitals:  Vitals:   09/20/20 0900 09/20/20 0915  BP: 127/71 113/63  Pulse: (!) 104 100  Resp: 20 20  Temp:  (!) 36.4 C  SpO2: 96% 97%    Last Pain:  Vitals:   09/20/20 0915  TempSrc:   PainSc: 0-No pain                 Winson Eichorn

## 2020-09-22 ENCOUNTER — Encounter (HOSPITAL_COMMUNITY): Payer: Self-pay | Admitting: Neurosurgery

## 2020-09-26 ENCOUNTER — Encounter: Payer: Self-pay | Admitting: Neurology

## 2020-09-26 ENCOUNTER — Ambulatory Visit (INDEPENDENT_AMBULATORY_CARE_PROVIDER_SITE_OTHER): Payer: Managed Care, Other (non HMO) | Admitting: Neurology

## 2020-09-26 ENCOUNTER — Other Ambulatory Visit: Payer: Self-pay

## 2020-09-26 DIAGNOSIS — G35 Multiple sclerosis: Secondary | ICD-10-CM | POA: Diagnosis not present

## 2020-09-26 DIAGNOSIS — Z95828 Presence of other vascular implants and grafts: Secondary | ICD-10-CM | POA: Diagnosis not present

## 2020-09-26 DIAGNOSIS — G801 Spastic diplegic cerebral palsy: Secondary | ICD-10-CM | POA: Diagnosis not present

## 2020-09-26 NOTE — Progress Notes (Signed)
HPI    Pump bolus set up     Additional comments: EMG room 3, alone. Here for Tysabri infusion and getting pump set up with bolus.       Last edited by Arther Abbott, RN on 09/26/2020 11:28 AM. (History)       Chief Complaint    Pump bolus set up      HISTORY:  Keith Morrow is a 49 y.o. man with a relapsing form of multiple sclerosis who has right greater than left leg weakness and progressive difficulties with gait function.  Due to severe spasticity, a baclofen pump was placed in 2013 and replaced 09/2020.  Update 09/26/2020: He has a new baclofen pump that allow patient controlled boluses.   Currently he is on 580 mcg baclofen/day and 135 mcg clonidine/day.    We discussed that on this dose, he notes more weakness in the mid/late afternoon   Night time spasticity is doing ok.   He also continues clonazepam at night.  No new MS issues.   He is on Tysabri and tolerates it well.   Strength and sensation are generally the same.     Update 08/14/2020: Over the last few days, he has noted more numbness in the left foot.  It is occurring in the dorsum of the left foot towards the 5th toe.    He does note he needs to sit more (now almost the whole day)   He notes no change in strength.   Previously only the right foot had numbness.  He gets cramps in his back.   He takes extra baclofen when that occurs.   He also has klonopin which helps spasms at night.   He gets some pain in his feet as the day goes on and notes more edema as the day goes on.   He wears compression hose.  He has a baclofen pump and will be getting a replacement next month with Dr. Venetia Maxon.  Currently, the pump is being refilled by a home nursing agency.  When he gets the new pump, he would like to switch from scheduled boluses to on demand boluses.  To aid in the transition we have recommended that the pump settings be changed with straight continuous intrathecal infusion rather than continuous plus boluses.   Current  settings or a rate of 20.4 mcg/h and boluses of 60, 30 and 60 mg in the late evening and night.   MS history:  In 1997, he had left optic neuritis and then had numbness in both feet. MRI was consistent with MS and a lumbar puncture was also performed and CSF was consistent with MS. Initially, he was placed on Copaxone. He had difficulties with compliance and 5 years later switched to Avonex. Unfortunate, he had difficulties tolerating Avonex and also had difficulty tolerating Rebif. In 2004, he started Tysabri but was only able to take 2 doses before was taken off the market. He restarted in 2005 and has been on Tysabri since then with the exception of a 6 month holiday. Tolerates Tysabri well. He has not had any definite exacerbations though his gait has worsened over the past few years. He is JCV antibody negative.   He had recent MRI of the brain and spine and there were no changes.      Spasticity history: He was experiencing progressive spasticity in both legs.  He got a baclofen pump in 2013.  Prior to the pump he was having severe tonic  spasticity as well as frequent phasic spasms.  These improved.  Due to the spasticity, baclofen was increased in 2016 and 2017 but he became weaker.  I began to see him again and we added clonidine to the baclofen and cut his dose and added nighttime boluses.  He did better with those settings.  Imaging: MRI of the cervical spine dated 07/25/2020 and  MRI of the cervical spine from 07/05/2015.  Both showed multiple T2 hyperintense foci within the spinal cord.  They are located posterolaterally to the right below the cervicomedullary junction, anteriorly adjacent to C3C4,  posterolaterally to the left adjacent to C4, posterior adjacent to C4-C5, both the left and the right adjacent to C4-C5, very large right focus adjacent to C5, posterior laterally to the left adjacent to C6, towards the right adjacent to C6 and posterior laterally to the right adjacent to C7-T1.  None  of the foci enhance.  There did not appear to be any new lesions between 2016 and 2021.   There is a right disc protrusion causing mild to moderate right foraminal narrowing.  At C5-C6, there is a right disc protrusion but no nerve root compression  The MRI of the brain from 07/25/2020 is unchanged compared to the 12/19/2015 MRI according to the official report.  Most foci are supratentorial but somewhat noted in the pons.    There are no enhancing lesions.   PHYSICAL EXAM  General: The patient is well-developed and well-nourished and in no acute distress.  Extremities:   There are no rashes or edema.  He has mild tenderness over the left subacromial bursa and some pain upon external rotation and elevation  Neurologic Exam  Mental status: The patient is alert and oriented x 3 at the time of the examination. Good focus/attention.   Speech is normal.  CN:   Extraocular muscles are normal.  No ptosis.    Symmetric facial strength and sensation.    PE/TP midline  Motor: Muscle bulk is normal.tone is increased in the right arm and minimally increased in the legs.  Strength is 4+/5 in the right arm, 5/5 in the left arm, 2+/5 in the right leg and 3 to 4-/5 in the left leg .   Reduced RAM in right hand  Sensation: He has reduced sensation in the outer aspect of the left foot and over the dorsum adjacent to the fourth and fifth digits.  Gait and station: Requires support to stand.     He has foot drop bilaterally, worse on the right he cannot tandem walk  Reflexes: Deep tendon reflexes are trace at the left knee and 2+ at the right knee.No ankleclonus   ASSESSMENT  Multiple sclerosis (HCC)  Presence of implanted infusion pump  Spastic diplegia (HCC)    PLAN: 1.  Continue the intrathecal pump : 1800 mcg/mL baclofen and 420 mcg/mL clonidine.  The continuous basal rate was changed to 20 mcg/h (480 mcg/day).  He will be allowed to have four 30 mcg boluses a day with a 2-hour lockout.   Boluses will infuse over 5 minutes.   2.   Continue Tysabri every 4 weeks.    3.   Continue other med's    4.   Return in 6 months or sooner if there are new or worsening neurologic issues.  We can make further adjustments if needed before the next refill  Rehmat Murtagh A. Epimenio Foot, MD, PhD, FAAN Certified in Neurology, Clinical Neurophysiology, Sleep Medicine, Pain Medicine and Neuroimaging Director, Multiple Sclerosis  Center at [address] [facility]  [facility] [address] [place], [place] [address] [phone]

## 2020-09-26 NOTE — Progress Notes (Signed)
Faxed most recent report from pump today to [facility] at [phone]. Received fax confirmation.  Current settings: Lioresal 1800.76mcg/ml and clonidine 420.0 mcg/ml  Current settings Mon-Sun: Simple Continuous: Lioresal 480.52mcg/day (20.86mcg/hr), clonidine 112.15 mcg/day (4.67mcg/hr)  PTM: Lioresal 30.56mcg and clonidine 7/31mch Max daily dose: lioresal 594.76mcg, clonidine 138. Duration: 5 min, max activations: 4/day, lockout interval: 2hr

## 2020-09-27 ENCOUNTER — Ambulatory Visit (INDEPENDENT_AMBULATORY_CARE_PROVIDER_SITE_OTHER): Payer: 59 | Admitting: Psychology

## 2020-09-27 DIAGNOSIS — F4323 Adjustment disorder with mixed anxiety and depressed mood: Secondary | ICD-10-CM | POA: Diagnosis not present

## 2020-09-28 ENCOUNTER — Other Ambulatory Visit: Payer: Self-pay | Admitting: Family Medicine

## 2020-09-30 ENCOUNTER — Other Ambulatory Visit: Payer: Self-pay | Admitting: Neurology

## 2020-10-08 ENCOUNTER — Ambulatory Visit (INDEPENDENT_AMBULATORY_CARE_PROVIDER_SITE_OTHER): Payer: Managed Care, Other (non HMO) | Admitting: Family Medicine

## 2020-10-08 ENCOUNTER — Ambulatory Visit: Payer: Managed Care, Other (non HMO) | Attending: Internal Medicine

## 2020-10-08 ENCOUNTER — Other Ambulatory Visit (HOSPITAL_BASED_OUTPATIENT_CLINIC_OR_DEPARTMENT_OTHER): Payer: Self-pay | Admitting: Internal Medicine

## 2020-10-08 ENCOUNTER — Other Ambulatory Visit: Payer: Self-pay | Admitting: Family Medicine

## 2020-10-08 ENCOUNTER — Other Ambulatory Visit: Payer: Self-pay

## 2020-10-08 ENCOUNTER — Encounter: Payer: Self-pay | Admitting: Family Medicine

## 2020-10-08 ENCOUNTER — Other Ambulatory Visit: Payer: Self-pay | Admitting: Neurology

## 2020-10-08 DIAGNOSIS — E785 Hyperlipidemia, unspecified: Secondary | ICD-10-CM | POA: Diagnosis not present

## 2020-10-08 DIAGNOSIS — R739 Hyperglycemia, unspecified: Secondary | ICD-10-CM

## 2020-10-08 DIAGNOSIS — I10 Essential (primary) hypertension: Secondary | ICD-10-CM

## 2020-10-08 DIAGNOSIS — G35 Multiple sclerosis: Secondary | ICD-10-CM

## 2020-10-08 DIAGNOSIS — E039 Hypothyroidism, unspecified: Secondary | ICD-10-CM

## 2020-10-08 DIAGNOSIS — H6692 Otitis media, unspecified, left ear: Secondary | ICD-10-CM | POA: Diagnosis not present

## 2020-10-08 DIAGNOSIS — Z23 Encounter for immunization: Secondary | ICD-10-CM

## 2020-10-08 MED ORDER — CEFDINIR 300 MG PO CAPS: 300.0000 mg | ORAL_CAPSULE | Freq: Two times a day (BID) | ORAL | 0 refills | Status: AC

## 2020-10-08 NOTE — Assessment & Plan Note (Addendum)
hgba1c acceptable, minimize simple carbs. Increase exercise as tolerated. Flu shot given today 

## 2020-10-08 NOTE — Progress Notes (Signed)
Subjective:    Patient ID: Keith Morrow, male    DOB: 1971/06/15, 49 y.o.   MRN: [id number]  Chief Complaint  Patient presents with  . Follow-up     Pt states that his Left ear aching.    HPI Patient is in today for follow up on chronic medical concerns. No recent febrile illness or hospitalizations. No change in bowel or bladder habits. He is noting anhedonia but feels he is doing adequately with help of behavioral health and psych. Using Clonopin a little while before his family gets home in the evening and that is helping him manage the increased noise and chaos. Denies CP/palp/SOB/HA/congestion/fevers/GI or GU c/o. Taking meds as prescribed  Past Medical History:  Diagnosis Date  . Acute bronchitis 05/09/2013  . Allergy   . Bronchitis, acute 12/10/2011  . Candidal skin infection 05/05/2017  . Chicken pox as a child  . Constipation 02/20/2017  . Coronary atherosclerosis of native coronary artery    a. NSTEMI 04/20/11: tx with Promus DES to LAD; bifurcation lesion with oDx 90% tx with POBA; b. staged PCI of right post. AV Branch with Promus DES; c. residual at cath 04/21/11:  AV groove CFX 50%, mRCA 30%,; EF 55%  . Depression   . Depression with anxiety   . Dermatitis, contact 12/10/2011  . Fatigue 10/15/2012  . Heart attack (HCC) 04-20-11  . HTN (hypertension)   . Hyperlipidemia   . Hypotestosteronism 11/17/2011  . Hypothyroidism   . Keloid scar   . MS (multiple sclerosis) (HCC)   . Neuromuscular disorder (HCC)    NUMBNESS/TINGLING  . Obesity   . Onychomycosis   . Preventative health care 10/07/2011  . Reflux 10/07/2011  . Sleep apnea   . Sun-damaged skin 02/17/2016  . Testosterone deficiency 01/16/2012  . Tinea pedis   . 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 Visual changes 11/13/2016    Past Surgical History:  Procedure Laterality Date  . CORONARY ANGIOPLASTY WITH STENT PLACEMENT     2012  . PAIN PUMP IMPLANTATION N/A 08/29/2014   Procedure: Baclofen pump  placement;  Surgeon: Maeola Harman, MD;  Location: [facility];  Service: Neurosurgery;  Laterality: N/A;  Baclofen pump placement  . PAIN PUMP IMPLANTATION Right 09/20/2020   Procedure: Right Baclofen pump replacement;  Surgeon: Maeola Harman, MD;  Location: [facility];  Service: Neurosurgery;  Laterality: Right;  Right Baclofen pump replacement  . PROGRAMABLE BACLOFEN PUMP REVISION  08/29/14  . right wrist surgery     CTR  . VASCULAR SURGERY     vericose vein in right femoral area  . VASECTOMY    . WISDOM TOOTH EXTRACTION      Family History  Problem Relation Age of Onset  . Heart attack Mother 42  . Thyroid disease Mother   . Heart disease Mother   . Thyroid disease Sister   . Thyroid disease Brother   . Heart disease Maternal Grandmother   . Hypertension Maternal Grandmother   . Hyperlipidemia Maternal Grandmother   . Heart disease Maternal Grandfather   . Hypertension Maternal Grandfather   . Hyperlipidemia Maternal Grandfather   . Stroke Paternal Grandfather   . Thyroid disease Sister   . Heart disease Father        2 stents  . Coronary artery disease Other        questionable in father    Social History   Socioeconomic History  . Marital status: Married    Spouse  name: Not on file  . Number of children: 3  . Years of education: Not on file  . Highest education level: Not on file  Occupational History  . Not on file  Tobacco Use  . Smoking status: Never Smoker  . Smokeless tobacco: Never Used  Vaping Use  . Vaping Use: Never used  Substance and Sexual Activity  . Alcohol use: Yes    Comment: socially  . Drug use: No  . Sexual activity: Yes    Partners: Female    Comment: 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, works Engineer, maintenance, no dietary restrictions  Other Topics Concern  . Not on file  Social History Narrative  . Not on file   Social Determinants of Health   Financial Resource Strain:   . Difficulty of Paying Living Expenses: Not on file  Food  Insecurity:   . Worried About Programme researcher, broadcasting/film/video in the Last Year: Not on file  . Ran Out of Food in the Last Year: Not on file  Transportation Needs:   . Lack of Transportation (Medical): Not on file  . Lack of Transportation (Non-Medical): Not on file  Physical Activity:   . Days of Exercise per Week: Not on file  . Minutes of Exercise per Session: Not on file  Stress:   . Feeling of Stress : Not on file  Social Connections:   . Frequency of Communication with Friends and Family: Not on file  . Frequency of Social Gatherings with Friends and Family: Not on file  . Attends Religious Services: Not on file  . Active Member of Clubs or Organizations: Not on file  . Attends Banker Meetings: Not on file  . Marital Status: Not on file  Intimate Partner Violence:   . Fear of Current or Ex-Partner: Not on file  . Emotionally Abused: Not on file  . Physically Abused: Not on file  . Sexually Abused: Not on file    Outpatient Medications Prior to Visit  Medication Sig Dispense Refill  . aspirin EC 81 MG tablet Take 81 mg by mouth daily.    Marland Kitchen atorvastatin (LIPITOR) 40 MG tablet Take 1 tablet (40 mg total) by mouth daily at 6 PM. 90 tablet 3  . baclofen (LIORESAL) 10 MG tablet Take 1 tablet (10 mg total) by mouth 3 (three) times daily as needed for muscle spasms. 90 each 5  . BACLOFEN IT 1 Dose by Intrathecal route as directed.     . busPIRone (BUSPAR) 10 MG tablet Take 1 tablet (10 mg total) by mouth 3 (three) times daily. 270 tablet 0  . cetirizine (ZYRTEC) 10 MG tablet Take 10 mg by mouth daily as needed for allergies.     . clobetasol cream (TEMOVATE) 0.05 % Apply 1 application topically 2 (two) times daily as needed (skin).    . clonazePAM (KLONOPIN) 1 MG tablet TAKE 1 TABLET (1 MG TOTAL) BY MOUTH 4 (FOUR) TIMES DAILY. 360 tablet 1  . Clonidine HCl POWD by Does not apply route. 720mg /ml IT Baclofen. Total dose 157.89mg /day. Last pump refill 06/02/16    .  clotrimazole-betamethasone (LOTRISONE) cream Apply 1 application topically 2 (two) times daily as needed (anti-fungal). 30 g 2  . CYMBALTA 60 MG capsule Take 1 capsule (60 mg total) by mouth 2 (two) times daily. 180 capsule 0  . dalfampridine 10 MG TB12 TAKE 1 TABLET (10 MG TOTAL) EVERY 12 HOURS (Patient taking differently: Take 10 mg by mouth every 12 (twelve) hours. )  180 tablet 3  . famotidine (PEPCID) 40 MG tablet TAKE 1 TABLET BY MOUTH EVERYDAY AT BEDTIME (Patient taking differently: Take 40 mg by mouth at bedtime. ) 90 tablet 3  . furosemide (LASIX) 20 MG tablet 1 ta po daily x 5 days and then as needed for SOB, weight gain>3#, pedal edema and can take a second tab daily as needed 45 tablet 3  . gabapentin (NEURONTIN) 600 MG tablet Take 1 tablet (600 mg total) by mouth 4 (four) times daily. 360 tablet 3  . HYDROcodone-acetaminophen (NORCO/VICODIN) 5-325 MG tablet Take 1 tablet by mouth every 4 (four) hours as needed for moderate pain. 20 tablet 0  . ibuprofen (ADVIL,MOTRIN) 200 MG tablet Take 200 mg by mouth every 6 (six) hours as needed for moderate pain.    . methylphenidate (RITALIN) 10 MG tablet Take 1 tablet (10 mg total) by mouth 4 (four) times daily. 120 tablet 0  . mupirocin ointment (BACTROBAN) 2 % Apply 1 application topically 2 (two) times daily as needed. 22 g 1  . natalizumab (TYSABRI) 300 MG/15ML injection Inject 300 mg into the vein every 28 (twenty-eight) days.     . nitroGLYCERIN (NITROSTAT) 0.4 MG SL tablet PLACE 1 TABLET UNDER THE TOUNGE EVERY 5 MINUTES AS NEEDED FOR CHEST PAIN (X 3 DOSES) (Patient taking differently: Place 0.4 mg under the tongue every 5 (five) minutes as needed. ) 25 tablet 0  . nystatin cream (MYCOSTATIN) Apply 1 application topically as directed. 30 g 2  . omeprazole (PRILOSEC) 40 MG capsule TAKE 1 CAPSULE BY MOUTH EVERY DAY (Patient taking differently: Take 40 mg by mouth daily. ) 90 capsule 1  . potassium chloride SA (KLOR-CON M20) 20 MEQ tablet TAKE 1  TABLET BY MOUTH EVERY DAY AS NEEDED WHEN TAKING A SECOND LASIX TAB ANY GIVEN DAY 90 tablet 1  . sucralfate (CARAFATE) 1 g tablet TAKE 1 TABLET BY MOUTH 4 TIMES DAILY - WITH MEALS AND AT BEDTIME. TAKE TABLET OR LIQUID BUT NOT BOTH (Patient taking differently: Take 1 g by mouth 4 (four) times daily -  with meals and at bedtime. ) 120 tablet 1  . sucralfate (CARAFATE) 1 GM/10ML suspension Take 10 mLs (1 g total) by mouth 4 (four) times daily -  with meals and at bedtime. Take liquid or tablets but not both 420 mL 3  . SYNTHROID 150 MCG tablet TAKE 1 TABLET BY MOUTH EVERY DAY BEFORE BREAKFAST (Patient taking differently: Take 150 mcg by mouth daily before breakfast. ) 90 tablet 0  . tamsulosin (FLOMAX) 0.4 MG CAPS capsule TAKE 1 CAPSULE BY MOUTH EVERY DAY (Patient taking differently: Take by mouth daily. ) 90 capsule 3  . triamcinolone cream (KENALOG) 0.1 % Apply 1 application topically 2 (two) times daily as needed. 80 g 2  . cyclobenzaprine (FLEXERIL) 10 MG tablet TAKE 1 TABLET BY MOUTH THREE TIMES A DAY AS NEEDED FOR MUSCLE SPASMS (Patient taking differently: Take 10 mg by mouth 3 (three) times daily as needed for muscle spasms. ) 90 tablet 3  . olmesartan-hydrochlorothiazide (BENICAR HCT) 20-12.5 MG tablet TAKE 1 TABLET BY MOUTH EVERY DAY (Patient taking differently: Take 1 tablet by mouth daily. ) 90 tablet 0   No facility-administered medications prior to visit.    No Known Allergies  Review of Systems  Constitutional: Negative for fever and malaise/fatigue.  HENT: Negative for congestion.   Eyes: Negative for blurred vision.  Respiratory: Negative for shortness of breath.   Cardiovascular: Negative for chest pain,  palpitations and leg swelling.  Gastrointestinal: Negative for abdominal pain, blood in stool and nausea.  Genitourinary: Negative for dysuria and frequency.  Musculoskeletal: Negative for falls.  Skin: Negative for rash.  Neurological: Negative for dizziness, loss of  consciousness and headaches.  Endo/Heme/Allergies: Negative for environmental allergies.  Psychiatric/Behavioral: Positive for depression. The patient is not nervous/anxious.        Objective:    Physical Exam Vitals and nursing note reviewed.  Constitutional:      General: He is not in acute distress.    Appearance: He is well-developed.  HENT:     Head: Normocephalic and atraumatic.     Nose: Nose normal.  Eyes:     General:        Right eye: No discharge.        Left eye: No discharge.  Cardiovascular:     Rate and Rhythm: Normal rate and regular rhythm.     Heart sounds: No murmur heard.   Pulmonary:     Effort: Pulmonary effort is normal.     Breath sounds: Normal breath sounds.  Abdominal:     General: Bowel sounds are normal.     Palpations: Abdomen is soft.     Tenderness: There is no abdominal tenderness.  Musculoskeletal:     Cervical back: Normal range of motion and neck supple.  Skin:    General: Skin is warm and dry.  Neurological:     Mental Status: He is alert and oriented to person, place, and time.     Comments: Wheelchair bound     BP 130/70 (BP Location: Left Arm)   Pulse 81   Temp 98.3 F (36.8 C) (Oral)   Resp 16   Ht  (1.905 m)   Wt 299 lb 6.4 oz (135.8 kg)   SpO2 95%   BMI 37.42 kg/m  Wt Readings from Last 3 Encounters:  10/08/20 299 lb 6.4 oz (135.8 kg)  09/20/20 290 lb (131.5 kg)  09/18/20 291 lb 0.1 oz (132 kg)    Diabetic Foot Exam - Simple   No data filed     Lab Results  Component Value Date   WBC 8.8 09/18/2020   HGB 13.7 09/18/2020   HCT 42.9 09/18/2020   PLT 169 09/18/2020   GLUCOSE 100 (H) 09/18/2020   CHOL 137 07/03/2020   TRIG 50.0 07/03/2020   HDL 51.50 07/03/2020   LDLCALC 75 07/03/2020   ALT 23 07/03/2020   AST 16 07/03/2020   NA 140 09/18/2020   K 4.1 09/18/2020   CL 104 09/18/2020   CREATININE 0.92 09/18/2020   BUN 14 09/18/2020   CO2 26 09/18/2020   TSH 0.54 07/03/2020   PSA 0.52 02/11/2016    INR 1.00 04/21/2011   HGBA1C 5.6 07/03/2020    Lab Results  Component Value Date   TSH 0.54 07/03/2020   Lab Results  Component Value Date   WBC 8.8 09/18/2020   HGB 13.7 09/18/2020   HCT 42.9 09/18/2020   MCV 92.9 09/18/2020   PLT 169 09/18/2020   Lab Results  Component Value Date   NA 140 09/18/2020   K 4.1 09/18/2020   CO2 26 09/18/2020   GLUCOSE 100 (H) 09/18/2020   BUN 14 09/18/2020   CREATININE 0.92 09/18/2020   BILITOT 0.5 07/03/2020   ALKPHOS 69 07/03/2020   AST 16 07/03/2020   ALT 23 07/03/2020   PROT 6.3 07/03/2020   ALBUMIN 4.0 07/03/2020   CALCIUM 9.1 09/18/2020  ANIONGAP 10 09/18/2020   GFR 91.48 07/03/2020   Lab Results  Component Value Date   CHOL 137 07/03/2020   Lab Results  Component Value Date   HDL 51.50 07/03/2020   Lab Results  Component Value Date   LDLCALC 75 07/03/2020   Lab Results  Component Value Date   TRIG 50.0 07/03/2020   Lab Results  Component Value Date   CHOLHDL 3 07/03/2020   Lab Results  Component Value Date   HGBA1C 5.6 07/03/2020       Assessment & Plan:   Problem List Items Addressed This Visit    Multiple sclerosis (HCC)    Continues to follow with Dr Leilani Merl of neurology and is largely wheelchair bound but still functioning with family support at home.       Hyperlipidemia    Encouraged heart healthy diet, increase exercise, avoid trans fats, consider a krill oil cap daily      Hypothyroidism    On Synthroid and in the past when he was forced to switch to Levothyroxine his numbers were very labile. He is worried his insurance is going to force him switch to the generic. His mother also had trouble with her numbers when they made her switch also      HTN (hypertension)    Well controlled, no changes to meds. Encouraged heart healthy diet such as the DASH diet and exercise as tolerated.       Hyperglycemia    hgba1c acceptable, minimize simple carbs. Increase exercise as tolerated. Flu shot given  today      Infection of left ear   Relevant Medications   cefdinir (OMNICEF) 300 MG capsule      I am having Teena Irani. Maris start on cefdinir. I am also having him maintain his cetirizine, aspirin EC, ibuprofen, BACLOFEN IT, natalizumab, Clonidine HCl, clotrimazole-betamethasone, nitroGLYCERIN, clobetasol cream, baclofen, sucralfate, furosemide, dalfampridine, tamsulosin, gabapentin, sucralfate, atorvastatin, triamcinolone cream, potassium chloride SA, mupirocin ointment, omeprazole, famotidine, busPIRone, Synthroid, methylphenidate, HYDROcodone-acetaminophen, nystatin cream, Cymbalta, and clonazePAM.  Meds ordered this encounter  Medications  . cefdinir (OMNICEF) 300 MG capsule    Sig: Take 1 capsule (300 mg total) by mouth 2 (two) times daily for 10 days.    Dispense:  20 capsule    Refill:  0    Only fill if patient requests     [name], MD

## 2020-10-08 NOTE — Progress Notes (Signed)
° °  Covid-19 Vaccination Clinic  Name:  Keith Morrow    MRN: [id number] DOB: 09-03-1971  10/08/2020  Mr. Keith Morrow was observed post Covid-19 immunization for 15 minutes without incident. He was provided with Vaccine Information Sheet and instruction to access the V-Safe system.   Mr. Keith Morrow was instructed to call 911 with any severe reactions post vaccine:  Difficulty breathing   Swelling of face and throat   A fast heartbeat   A bad rash all over body   Dizziness and weakness

## 2020-10-08 NOTE — Assessment & Plan Note (Signed)
Encouraged heart healthy diet, increase exercise, avoid trans fats, consider a krill oil cap daily 

## 2020-10-08 NOTE — Assessment & Plan Note (Signed)
Well controlled, no changes to meds. Encouraged heart healthy diet such as the DASH diet and exercise as tolerated.  °

## 2020-10-08 NOTE — Patient Instructions (Signed)

## 2020-10-08 NOTE — Assessment & Plan Note (Signed)
On Synthroid and in the past when he was forced to switch to Levothyroxine his numbers were very labile. He is worried his insurance is going to force him switch to the generic. His mother also had trouble with her numbers when they made her switch also

## 2020-10-08 NOTE — Assessment & Plan Note (Signed)
Continues to follow with Dr Leilani Merl of neurology and is largely wheelchair bound but still functioning with family support at home.

## 2020-10-11 ENCOUNTER — Ambulatory Visit (INDEPENDENT_AMBULATORY_CARE_PROVIDER_SITE_OTHER): Payer: 59 | Admitting: Psychology

## 2020-10-11 DIAGNOSIS — F4323 Adjustment disorder with mixed anxiety and depressed mood: Secondary | ICD-10-CM

## 2020-10-17 MED FILL — PFIZER-BIONTECH COVID-19 VA: 30 | 1 days supply | Qty: 0 | Fill #0

## 2020-10-18 ENCOUNTER — Encounter: Payer: Self-pay | Admitting: *Deleted

## 2020-10-23 ENCOUNTER — Other Ambulatory Visit: Payer: Self-pay | Admitting: Family Medicine

## 2020-10-25 ENCOUNTER — Ambulatory Visit (INDEPENDENT_AMBULATORY_CARE_PROVIDER_SITE_OTHER): Payer: 59 | Admitting: Psychology

## 2020-10-25 ENCOUNTER — Telehealth: Payer: Self-pay | Admitting: *Deleted

## 2020-10-25 DIAGNOSIS — F4323 Adjustment disorder with mixed anxiety and depressed mood: Secondary | ICD-10-CM | POA: Diagnosis not present

## 2020-10-25 NOTE — Telephone Encounter (Signed)
Pt here today for Tysabri infusion. Requested changes be made to PTM for baclofen pump. The following changes were made:  Infusion:  Lioresal 440.5mcg/day and clonidine 102.73mcg/day PTM: Max daily dose: lioresal 523.59mcg, clonidine 122.60mcg Duration: 30 min, max activations: 4/day, lockout interval: 2hr   Old settings: PTM: Lioresal 30.61mcg and clonidine 7/73mch Max daily dose: lioresal 594.34mcg, clonidine 138. Duration: 5 min, max activations: 4/day, lockout interval: 2hr   Faxed updated report to [facility] at [phone]. Received fax confirmation.

## 2020-11-04 ENCOUNTER — Other Ambulatory Visit: Payer: Self-pay | Admitting: Family Medicine

## 2020-11-08 ENCOUNTER — Ambulatory Visit (INDEPENDENT_AMBULATORY_CARE_PROVIDER_SITE_OTHER): Payer: 59 | Admitting: Psychology

## 2020-11-08 DIAGNOSIS — F4323 Adjustment disorder with mixed anxiety and depressed mood: Secondary | ICD-10-CM | POA: Diagnosis not present

## 2020-11-22 ENCOUNTER — Other Ambulatory Visit: Payer: Self-pay | Admitting: Neurology

## 2020-11-22 ENCOUNTER — Ambulatory Visit (INDEPENDENT_AMBULATORY_CARE_PROVIDER_SITE_OTHER): Payer: 59 | Admitting: Psychology

## 2020-11-22 ENCOUNTER — Telehealth: Payer: Self-pay | Admitting: *Deleted

## 2020-11-22 DIAGNOSIS — F4323 Adjustment disorder with mixed anxiety and depressed mood: Secondary | ICD-10-CM

## 2020-11-22 DIAGNOSIS — G35 Multiple sclerosis: Secondary | ICD-10-CM

## 2020-11-22 NOTE — Addendum Note (Signed)
Addended by: Tamera Stands D on: 11/22/2020 09:06 AM   Modules accepted: Orders

## 2020-11-22 NOTE — Telephone Encounter (Signed)
Placed JCV lab in [facility] lock box for routine lab pick up. Results pending. 

## 2020-11-23 LAB — CBC WITH DIFFERENTIAL/PLATELET
Basophils Absolute: 0.1 10*3/uL (ref 0.0–0.2)
Basos: 1 %
EOS (ABSOLUTE): 0.6 10*3/uL — ABNORMAL HIGH (ref 0.0–0.4)
Eos: 9 %
Hematocrit: 41 % (ref 37.5–51.0)
Hemoglobin: 13.9 g/dL (ref 13.0–17.7)
Immature Grans (Abs): 0.1 10*3/uL (ref 0.0–0.1)
Immature Granulocytes: 1 %
Lymphocytes Absolute: 2.6 10*3/uL (ref 0.7–3.1)
Lymphs: 35 %
MCH: 29.8 pg (ref 26.6–33.0)
MCHC: 33.9 g/dL (ref 31.5–35.7)
MCV: 88 fL (ref 79–97)
Monocytes Absolute: 0.6 10*3/uL (ref 0.1–0.9)
Monocytes: 9 %
Neutrophils Absolute: 3.4 10*3/uL (ref 1.4–7.0)
Neutrophils: 45 %
Platelets: 194 10*3/uL (ref 150–450)
RBC: 4.67 x10E6/uL (ref 4.14–5.80)
RDW: 13.7 % (ref 11.6–15.4)
WBC: 7.4 10*3/uL (ref 3.4–10.8)

## 2020-12-02 ENCOUNTER — Encounter: Payer: Self-pay | Admitting: Family Medicine

## 2020-12-02 DIAGNOSIS — S90829A Blister (nonthermal), unspecified foot, initial encounter: Secondary | ICD-10-CM

## 2020-12-03 ENCOUNTER — Other Ambulatory Visit: Payer: Self-pay | Admitting: *Deleted

## 2020-12-03 ENCOUNTER — Other Ambulatory Visit: Payer: Self-pay | Admitting: Neurology

## 2020-12-03 MED ORDER — METHYLPHENIDATE HCL 10 MG PO TABS
10.0000 mg | ORAL_TABLET | Freq: Four times a day (QID) | ORAL | 0 refills | Status: DC
Start: 2020-12-03 — End: 2021-02-12

## 2020-12-03 NOTE — Telephone Encounter (Signed)
JCV ab drawn on 11/22/2020, index: 0.17

## 2020-12-03 NOTE — Progress Notes (Signed)
Ok to refill 

## 2020-12-03 NOTE — Progress Notes (Signed)
Ok done

## 2020-12-04 NOTE — Telephone Encounter (Signed)
Urgent referral to podiatry placed

## 2020-12-04 NOTE — Progress Notes (Deleted)
[facility] [address], [address] [place], [place] [address] [phone]  Date:  12/06/2020   Name:  Keith Morrow   DOB:  09-Oct-1971   MRN:  [id number]  PCP:  [name], MD    Chief Complaint: No chief complaint on file.   History of Present Illness:  Keith Morrow is a 50 y.o. 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 It has not healed in the last 3 weeks. It is still pain full when pressed. For example putting on shoes, getting bumped, or when we examine it.   There is no oozing or bleeding, just a red painful circle.   My feet tend to take a little time to heal, but this seems to be healing slowly.     Patient Active Problem List   Diagnosis Date Noted  . Infection of left ear 10/08/2020  . High risk medication use 08/14/2020  . Numbness 08/14/2020  . Hyperglycemia 07/04/2020  . Rash 04/04/2020  . Impetigo 04/04/2020  . Presence of implanted infusion pump 02/27/2020  . Spastic diplegia (HCC) 02/27/2020  . Right leg pain 07/10/2019  . Hypersomnolence 01/16/2019  . Left shoulder pain 08/23/2018  . BRBPR (bright red blood per rectum) 08/01/2018  . Skin lesion of scalp 08/01/2018  . Low testosterone 07/30/2018  . Constipation 02/20/2017  . Visual changes 11/13/2016  . Right hand weakness 10/17/2016  . Dysesthesia 06/02/2016  . Erectile dysfunction 02/17/2016  . Sun-damaged skin 02/17/2016  . Gait disturbance 12/24/2015  . Spasticity 12/24/2015  . Urinary urgency 12/24/2015  . Insomnia 12/24/2015  . Dyspnea 10/10/2015  . Leg edema, right 05/15/2015  . Pedal edema 04/28/2015  . Dermatitis 03/06/2014  . Fatigue 10/15/2012  . Hypotestosteronism 11/17/2011  . Preventative  health care 10/07/2011  . GERD (gastroesophageal reflux disease) 10/07/2011  . Obesity   . Depression with anxiety   . Onychomycosis   . Tinea pedis   . Keloid scar   . CAD (coronary artery disease)   . Multiple sclerosis (HCC)   . Hyperlipidemia   . Hypothyroidism   . HTN (hypertension)     Past Medical History:  Diagnosis Date  . Acute bronchitis 05/09/2013  . Allergy   . Bronchitis, acute 12/10/2011  . Candidal skin infection 05/05/2017  . Chicken pox as a child  . Constipation 02/20/2017  . Coronary atherosclerosis of native coronary artery    a. NSTEMI 04/20/11: tx with Promus DES to LAD; bifurcation lesion with oDx 90% tx with POBA; b. staged PCI of right post. AV Branch with Promus DES; c. residual at cath 04/21/11:  AV groove CFX 50%, mRCA 30%,; EF 55%  . Depression   . Depression with anxiety   . Dermatitis, contact 12/10/2011  . Fatigue 10/15/2012  . Heart attack (HCC) 04-20-11  . HTN (hypertension)   . Hyperlipidemia   . Hypotestosteronism 11/17/2011  . Hypothyroidism   . Keloid scar   . MS (multiple sclerosis) (HCC)   . Neuromuscular disorder (HCC)    NUMBNESS/TINGLING  . Obesity   . Onychomycosis   . Preventative health care 10/07/2011  . Reflux 10/07/2011  . Sleep apnea   . Sun-damaged skin  02/17/2016  . Testosterone deficiency 01/16/2012  . Tinea pedis   . 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 Visual changes 11/13/2016    Past Surgical History:  Procedure Laterality Date  . CORONARY ANGIOPLASTY WITH STENT PLACEMENT     2012  . PAIN PUMP IMPLANTATION N/A 08/29/2014   Procedure: Baclofen pump placement;  Surgeon: Maeola Harman, MD;  Location: [facility];  Service: Neurosurgery;  Laterality: N/A;  Baclofen pump placement  . PAIN PUMP IMPLANTATION Right 09/20/2020   Procedure: Right Baclofen pump replacement;  Surgeon: Maeola Harman, MD;  Location: [facility] OR;  Service: Neurosurgery;  Laterality: Right;  Right Baclofen pump replacement  . PROGRAMABLE BACLOFEN  PUMP REVISION  08/29/14  . right wrist surgery     CTR  . VASCULAR SURGERY     vericose vein in right femoral area  . VASECTOMY    . WISDOM TOOTH EXTRACTION      Social History   Tobacco Use  . Smoking status: Never Smoker  . Smokeless tobacco: Never Used  Vaping Use  . Vaping Use: Never used  Substance Use Topics  . Alcohol use: Yes    Comment: socially  . Drug use: No    Family History  Problem Relation Age of Onset  . Heart attack Mother 65  . Thyroid disease Mother   . Heart disease Mother   . Thyroid disease Sister   . Thyroid disease Brother   . Heart disease Maternal Grandmother   . Hypertension Maternal Grandmother   . Hyperlipidemia Maternal Grandmother   . Heart disease Maternal Grandfather   . Hypertension Maternal Grandfather   . Hyperlipidemia Maternal Grandfather   . Stroke Paternal Grandfather   . Thyroid disease Sister   . Heart disease Father        2 stents  . Coronary artery disease Other        questionable in father    No Known Allergies  Medication list has been reviewed and updated.  Current Outpatient Medications on File Prior to Visit  Medication Sig Dispense Refill  . cyclobenzaprine (FLEXERIL) 10 MG tablet TAKE 1 TABLET BY MOUTH THREE TIMES A DAY AS NEEDED FOR MUSCLE SPASMS 90 tablet 3  . aspirin EC 81 MG tablet Take 81 mg by mouth daily.    Marland Kitchen atorvastatin (LIPITOR) 40 MG tablet Take 1 tablet (40 mg total) by mouth daily at 6 PM. 90 tablet 3  . baclofen (LIORESAL) 10 MG tablet Take 1 tablet (10 mg total) by mouth 3 (three) times daily as needed for muscle spasms. 90 each 5  . BACLOFEN IT 1 Dose by Intrathecal route as directed.     . busPIRone (BUSPAR) 10 MG tablet Take 1 tablet (10 mg total) by mouth 3 (three) times daily. 270 tablet 1  . cetirizine (ZYRTEC) 10 MG tablet Take 10 mg by mouth daily as needed for allergies.     . clobetasol cream (TEMOVATE) 0.05 % Apply 1 application topically 2 (two) times daily as needed (skin).    .  clonazePAM (KLONOPIN) 1 MG tablet TAKE 1 TABLET (1 MG TOTAL) BY MOUTH 4 (FOUR) TIMES DAILY. 360 tablet 1  . Clonidine HCl POWD by Does not apply route. 720mg /ml IT Baclofen. Total dose 157.89mg /day. Last pump refill 06/02/16    . clotrimazole-betamethasone (LOTRISONE) cream Apply 1 application topically 2 (two) times daily as needed (anti-fungal). 30 g 2  . CYMBALTA 60 MG capsule Take 1 capsule (60 mg total) by mouth 2 (  two) times daily. 180 capsule 0  . dalfampridine 10 MG TB12 TAKE 1 TABLET (10 MG TOTAL) EVERY 12 HOURS (Patient taking differently: Take 10 mg by mouth every 12 (twelve) hours. ) 180 tablet 3  . famotidine (PEPCID) 40 MG tablet TAKE 1 TABLET BY MOUTH EVERYDAY AT BEDTIME (Patient taking differently: Take 40 mg by mouth at bedtime. ) 90 tablet 3  . furosemide (LASIX) 20 MG tablet 1 ta po daily x 5 days and then as needed for SOB, weight gain>3#, pedal edema and can take a second tab daily as needed 45 tablet 3  . gabapentin (NEURONTIN) 600 MG tablet Take 1 tablet (600 mg total) by mouth 4 (four) times daily. 360 tablet 3  . HYDROcodone-acetaminophen (NORCO/VICODIN) 5-325 MG tablet Take 1 tablet by mouth every 4 (four) hours as needed for moderate pain. 20 tablet 0  . ibuprofen (ADVIL,MOTRIN) 200 MG tablet Take 200 mg by mouth every 6 (six) hours as needed for moderate pain.    . methylphenidate (RITALIN) 10 MG tablet Take 1 tablet (10 mg total) by mouth 4 (four) times daily. 120 tablet 0  . mupirocin ointment (BACTROBAN) 2 % Apply 1 application topically 2 (two) times daily as needed. 22 g 1  . natalizumab (TYSABRI) 300 MG/15ML injection Inject 300 mg into the vein every 28 (twenty-eight) days.     . nitroGLYCERIN (NITROSTAT) 0.4 MG SL tablet PLACE 1 TABLET UNDER THE TOUNGE EVERY 5 MINUTES AS NEEDED FOR CHEST PAIN (X 3 DOSES) (Patient taking differently: Place 0.4 mg under the tongue every 5 (five) minutes as needed. ) 25 tablet 0  . nystatin cream (MYCOSTATIN) Apply 1 application  topically as directed. 30 g 2  . olmesartan-hydrochlorothiazide (BENICAR HCT) 20-12.5 MG tablet Take 1 tablet by mouth daily. 90 tablet 1  . omeprazole (PRILOSEC) 40 MG capsule TAKE 1 CAPSULE BY MOUTH EVERY DAY (Patient taking differently: Take 40 mg by mouth daily. ) 90 capsule 1  . potassium chloride SA (KLOR-CON M20) 20 MEQ tablet TAKE 1 TABLET BY MOUTH EVERY DAY AS NEEDED WHEN TAKING A SECOND LASIX TAB ANY GIVEN DAY 90 tablet 1  . sucralfate (CARAFATE) 1 g tablet TAKE 1 TABLET BY MOUTH 4 TIMES DAILY - WITH MEALS AND AT BEDTIME. TAKE TABLET OR LIQUID BUT NOT BOTH (Patient taking differently: Take 1 g by mouth 4 (four) times daily -  with meals and at bedtime. ) 120 tablet 1  . sucralfate (CARAFATE) 1 GM/10ML suspension Take 10 mLs (1 g total) by mouth 4 (four) times daily -  with meals and at bedtime. Take liquid or tablets but not both 420 mL 3  . SYNTHROID 150 MCG tablet Take 1 tablet (150 mcg total) by mouth daily before breakfast. 90 tablet 1  . tamsulosin (FLOMAX) 0.4 MG CAPS capsule TAKE 1 CAPSULE BY MOUTH EVERY DAY (Patient taking differently: Take by mouth daily. ) 90 capsule 3  . triamcinolone cream (KENALOG) 0.1 % Apply 1 application topically 2 (two) times daily as needed. 80 g 2   No current facility-administered medications on file prior to visit.    Review of Systems:  As per HPI- otherwise negative.   Physical Examination: There were no vitals filed for this visit. There were no vitals filed for this visit. There is no height or weight on file to calculate BMI. Ideal Body Weight:    GEN: no acute distress. HEENT: Atraumatic, Normocephalic.  Ears and Nose: No external deformity. CV: RRR, No M/G/R. No JVD.  No thrill. No extra heart sounds. PULM: CTA B, no wheezes, crackles, rhonchi. No retractions. No resp. distress. No accessory muscle use. ABD: S, NT, ND, +BS. No rebound. No HSM. EXTR: No c/c/e PSYCH: Normally interactive. Conversant.    Assessment and  Plan: *** This visit occurred during the SARS-CoV-2 public health emergency.  Safety protocols were in place, including screening questions prior to the visit, additional usage of staff PPE, and extensive cleaning of exam room while observing appropriate contact time as indicated for disinfecting solutions.    Signed Abbe Amsterdam, MD

## 2020-12-04 NOTE — Telephone Encounter (Signed)
Pt is scheduled for 12/06/20 at 9:40 am with Dr. Patsy Lager

## 2020-12-06 ENCOUNTER — Ambulatory Visit: Payer: Managed Care, Other (non HMO) | Admitting: Family Medicine

## 2020-12-06 ENCOUNTER — Ambulatory Visit (INDEPENDENT_AMBULATORY_CARE_PROVIDER_SITE_OTHER): Payer: Managed Care, Other (non HMO) | Admitting: Podiatry

## 2020-12-06 ENCOUNTER — Other Ambulatory Visit: Payer: Self-pay

## 2020-12-06 DIAGNOSIS — L97521 Non-pressure chronic ulcer of other part of left foot limited to breakdown of skin: Secondary | ICD-10-CM

## 2020-12-06 NOTE — Progress Notes (Signed)
Subjective:   Patient ID: Keith Morrow, male   DOB: 49 y.o.   MRN: [id number]   HPI Patient presents with caregiver with a breakdown of tissue on the outside of the fifth toe left and patient is in a wheelchair with MS.  Patient also has edema in the legs and does wear compression stockings and did get new shoes and states it is been present for 3 weeks.  Patient does not smoke   Review of Systems  All other systems reviewed and are negative.       Objective:  Physical Exam Vitals and nursing note reviewed.  Constitutional:      Appearance: He is well-developed and well-nourished.  Cardiovascular:     Pulses: Intact distal pulses.  Pulmonary:     Effort: Pulmonary effort is normal.  Musculoskeletal:        General: Normal range of motion.  Skin:    General: Skin is warm.  Neurological:     Mental Status: He is alert.     Neurovascular status was found to be intact with patient found to have venous disease secondary to immobilization wheelchair with swelling of both feet negative Denna Haggard' sign noted.  On the left fifth digit there is a black area on the toe where there is been obvious abrasion and upon sterile debridement I did note skin slough but no indication of subcutaneous slough currently.  Patient did have good digital perfusion     Assessment:  Localized breakdown of tissue left fifth toe with no indications of cellulitis systemic infection     Plan:  H&P educated them on condition and debridement accomplished flush and applied Iodosorb with sterile dressing and padding.  Patient will be seen back if any redness swelling drainage were to occur and if any systemic signs of infection were to occur patient is to go straight to the emergency room

## 2020-12-13 ENCOUNTER — Ambulatory Visit (INDEPENDENT_AMBULATORY_CARE_PROVIDER_SITE_OTHER): Payer: 59 | Admitting: Psychology

## 2020-12-13 DIAGNOSIS — F4323 Adjustment disorder with mixed anxiety and depressed mood: Secondary | ICD-10-CM | POA: Diagnosis not present

## 2020-12-20 ENCOUNTER — Telehealth: Payer: Self-pay | Admitting: *Deleted

## 2020-12-20 NOTE — Telephone Encounter (Signed)
Pt here today for Tysabri infusion. Requested changes be made to PTM for baclofen pump. The following changes were made:   PTM: Max daily dose: lioresal 554.20mcg, clonidine 129.59mcg Duration: 5 min, max activations: 4/day, lockout interval: 2hr  Old settings: PTM: Max daily dose: lioresal 523.3mcg, clonidine 122.34mcg Duration: 30 min, max activations: 4/day, lockout interval: 2hr  Faxed updated report to [facility] at [phone]. Received fax confirmation.

## 2020-12-28 ENCOUNTER — Ambulatory Visit (INDEPENDENT_AMBULATORY_CARE_PROVIDER_SITE_OTHER): Payer: 59 | Admitting: Psychology

## 2020-12-28 DIAGNOSIS — F4323 Adjustment disorder with mixed anxiety and depressed mood: Secondary | ICD-10-CM | POA: Diagnosis not present

## 2020-12-30 ENCOUNTER — Other Ambulatory Visit: Payer: Self-pay | Admitting: Family Medicine

## 2020-12-31 ENCOUNTER — Telehealth: Payer: Self-pay | Admitting: *Deleted

## 2020-12-31 NOTE — Telephone Encounter (Signed)
Faxed completed/signed Tysabri pt status report and reauth questionnaire to [place] [facility] at [phone]. Received confirmation.    Received fax from touch prescribing program that pt re-authorized for Tysabri from 12/31/2020-08/04/2021. Pt enrollment number: [id number]. Account: [facility]. Site auth number: [id number].

## 2021-01-11 ENCOUNTER — Ambulatory Visit: Payer: 59 | Admitting: Psychology

## 2021-01-25 ENCOUNTER — Ambulatory Visit (INDEPENDENT_AMBULATORY_CARE_PROVIDER_SITE_OTHER): Payer: 59 | Admitting: Psychology

## 2021-01-25 DIAGNOSIS — F4323 Adjustment disorder with mixed anxiety and depressed mood: Secondary | ICD-10-CM

## 2021-01-28 ENCOUNTER — Telehealth: Payer: Self-pay | Admitting: Neurology

## 2021-01-28 NOTE — Telephone Encounter (Signed)
LVM for Keith Morrow at [facility] letting her know I still haven't received orders for MD to sign. Asked her to re-send to [phone]

## 2021-01-28 NOTE — Telephone Encounter (Signed)
Called Keith Morrow back. Asked they re-fax order needing to be signed, we have not received this. She will do this.

## 2021-01-28 NOTE — Telephone Encounter (Signed)
[facility] ([facility]) called, we fax over an order for pump fill last Tuesday. Pt scheduled for a home infusion next week. Could you send an order this week.  Contact info: [phone], ext [phone]

## 2021-01-29 ENCOUNTER — Telehealth: Payer: Self-pay

## 2021-01-29 NOTE — Telephone Encounter (Signed)
Called [facility] back. Spoke with [employer]. Advised we still have not received fax. She will have pharmacy re-fax to [phone]

## 2021-01-29 NOTE — Telephone Encounter (Signed)
Faxed completed/signed orders to [phone]. Received fax confirmation.

## 2021-01-29 NOTE — Telephone Encounter (Signed)
Liane, RN in the infusion suite informed me that patient is interested in receiving his Tysabri at home.  It is getting progressively more difficult for him to make it to the office.  I spoke with [employer].  They are investigating companies that will be able to do this for him and keep me updated.

## 2021-02-04 ENCOUNTER — Other Ambulatory Visit: Payer: Self-pay | Admitting: Family Medicine

## 2021-02-04 NOTE — Telephone Encounter (Signed)
Received fax from touch prescribing program that pt is re-authorized from 02/03/21-08/04/21 for Tysabri. Pt enrollment #: [id number]. Account: [facility] Site auth# [id number] [address] [place], [place] [address]. Phone: [phone] Fax: [phone]

## 2021-02-05 NOTE — Telephone Encounter (Signed)
Faxed referral and Tysabri order to [facility].  Received a receipt of confirmation.  We will continue to follow.

## 2021-02-06 ENCOUNTER — Encounter: Payer: Self-pay | Admitting: Neurology

## 2021-02-07 ENCOUNTER — Ambulatory Visit (INDEPENDENT_AMBULATORY_CARE_PROVIDER_SITE_OTHER): Payer: 59 | Admitting: Psychology

## 2021-02-07 DIAGNOSIS — F4323 Adjustment disorder with mixed anxiety and depressed mood: Secondary | ICD-10-CM | POA: Diagnosis not present

## 2021-02-11 ENCOUNTER — Telehealth: Payer: Self-pay | Admitting: Family Medicine

## 2021-02-11 MED ORDER — SYNTHROID 150 MCG PO TABS: 150.0000 ug | ORAL_TABLET | Freq: Every day | ORAL | 1 refills | Status: DC

## 2021-02-11 NOTE — Telephone Encounter (Signed)
Received this notice from [facility]: "As far as I can tell yes. Still need to have a pharmacist review and ensure all clinical documents are in order. Also I see notes from late Friday afternoon where [facility] has attempted to obtain auth for Tysabri services however, it appears the MDO already has [place] on file - auth #[id number]. Would you be able to call [employer] at [phone] and provide that auth number and advise you need to end the auth and provide them with his last infusion date? Rosann Auerbach will not allow [facility] to resubmit for approval until this has been completed. If you can let me know once this has been completed, we will resubmit."

## 2021-02-11 NOTE — Telephone Encounter (Signed)
I called [employer].  I provided them with this information.  The end of service will be 01/18/2021.  I have informed [facility] of this information as well so they can submit approval on their end.

## 2021-02-11 NOTE — Telephone Encounter (Signed)
Medication:SYNTHROID 150 MCG tablet  Patient is requesting for a refill but he wants Generic Synthroid instead.   Has the patient contacted their pharmacy? No. (If no, request that the patient contact the pharmacy for the refill.) (If yes, when and what did the pharmacy advise?)  Preferred Pharmacy (with phone number or street name):  [facility] - [place], [place] - [address] Phone:  [phone]  Fax:  [phone]      Agent: Please be advised that RX refills may take up to 3 business days. We ask that you follow-up with your pharmacy.

## 2021-02-11 NOTE — Telephone Encounter (Signed)
Spoke with patient and he stated that he can only [facility] they will not dispense the generic.  Rx sent in.

## 2021-02-12 ENCOUNTER — Telehealth: Payer: Self-pay | Admitting: *Deleted

## 2021-02-12 ENCOUNTER — Encounter: Payer: Self-pay | Admitting: Neurology

## 2021-02-12 ENCOUNTER — Ambulatory Visit (INDEPENDENT_AMBULATORY_CARE_PROVIDER_SITE_OTHER): Payer: Managed Care, Other (non HMO) | Admitting: Neurology

## 2021-02-12 VITALS — BP 118/80 | HR 88 | Ht 75.0 in

## 2021-02-12 DIAGNOSIS — G35 Multiple sclerosis: Secondary | ICD-10-CM | POA: Diagnosis not present

## 2021-02-12 DIAGNOSIS — Z79899 Other long term (current) drug therapy: Secondary | ICD-10-CM

## 2021-02-12 DIAGNOSIS — R269 Unspecified abnormalities of gait and mobility: Secondary | ICD-10-CM

## 2021-02-12 DIAGNOSIS — Z95828 Presence of other vascular implants and grafts: Secondary | ICD-10-CM

## 2021-02-12 DIAGNOSIS — G801 Spastic diplegic cerebral palsy: Secondary | ICD-10-CM

## 2021-02-12 MED ORDER — METHYLPHENIDATE HCL 10 MG PO TABS: 10.0000 mg | ORAL_TABLET | Freq: Four times a day (QID) | ORAL | 0 refills | Status: DC

## 2021-02-12 MED ORDER — DALFAMPRIDINE ER 10 MG PO TB12: ORAL_TABLET | ORAL | 3 refills | Status: DC

## 2021-02-12 NOTE — Progress Notes (Signed)
HPI    Follow-up     Additional comments: RM 13, alone. Last seen 09/26/20. On Tysabri for MS. Last infusion 01/17/21, next: 02/14/21. Last JCV 11/22/20 negative, index: 0.17. Sent refill for dalfampridine to [facility]. He would like refill for methylphenidate. He also wants 10% increase on pump.       Last edited by Arther Abbott, RN on 02/12/2021  3:22 PM. (History)       Chief Complaint    Follow-up      HISTORY:  Keith Morrow is a 50 y.o. man with a relapsing form of multiple sclerosis who has right greater than left leg weakness and progressive difficulties with gait function.  Due to severe spasticity, a baclofen pump was placed in 2013 and replaced 09/2020.  Update 02/12/2021: He is on Tysabri and tolerates it well.   No exacerbations.  He has noted slow progression of weakness and reduced coordination in the right arm/hand.   He has needed to switch to a larger pen to grip it better.   Sensation is generally the same.     He notes a little more trouble transferring out of the low wheelchair and does better with the higher wheelchair at the house.   Vision is fine.   Bladder function is fine on tamsulosin.  No UTI.      He sleeps well at night.  He sleeps better with CPAP.    Mood is doing reasonably well.    He has a new baclofen pump (2021) that allow patient controlled boluses.   Currently he is on 580 mcg baclofen/day and 135 mcg clonidine/day.    He is noting some spasticity during the day and clonus and is using the bolus more.  The boluses are set to infuse over 5 minutes and the effect takes about 15 more minutes.      Night time spasticity is doing ok.   He also takes clonazepam at night.   MS history:  In 1997, he had left optic neuritis and then had numbness in both feet. MRI was consistent with MS and a lumbar puncture was also performed and CSF was consistent with MS. Initially, he was placed on Copaxone. He had difficulties with compliance and 5 years later switched to  Avonex. Unfortunate, he had difficulties tolerating Avonex and also had difficulty tolerating Rebif. In 2004, he started Tysabri but was only able to take 2 doses before was taken off the market. He restarted in 2005 and has been on Tysabri since then with the exception of a 6 month holiday. Tolerates Tysabri well. He has not had any definite exacerbations though his gait has worsened over the past few years. He is JCV antibody negative.   He had recent MRI of the brain and spine and there were no changes.      Spasticity history: He was experiencing progressive spasticity in both legs.  He got a baclofen pump in 2013.  Prior to the pump he was having severe tonic spasticity as well as frequent phasic spasms.  These improved.  Due to the spasticity, baclofen was increased in 2016 and 2017 but he became weaker.  I began to see him again and we added clonidine to the baclofen and cut his dose and added nighttime boluses.  He did better with those settings.  Imaging: MRI of the cervical spine dated 07/25/2020 and  MRI of the cervical spine from 07/05/2015.  Both showed multiple T2 hyperintense foci within the spinal  cord.  They are located posterolaterally to the right below the cervicomedullary junction, anteriorly adjacent to C3C4,  posterolaterally to the left adjacent to C4, posterior adjacent to C4-C5, both the left and the right adjacent to C4-C5, very large right focus adjacent to C5, posterior laterally to the left adjacent to C6, towards the right adjacent to C6 and posterior laterally to the right adjacent to C7-T1.  None of the foci enhance.  There did not appear to be any new lesions between 2016 and 2021.   There is a right disc protrusion causing mild to moderate right foraminal narrowing.  At C5-C6, there is a right disc protrusion but no nerve root compression  The MRI of the brain from 07/25/2020 is unchanged compared to the 12/19/2015 MRI according to the official report.  Most foci are  supratentorial but somewhat noted in the pons.    There are no enhancing lesions.   PHYSICAL EXAM  General: The patient is well-developed and well-nourished and in no acute distress.  Extremities:   There are no rashes or edema.    Neurologic Exam  Mental status: The patient is alert and oriented x 3 at the time of the examination. Good focus/attention.   Speech is normal.  CN:   Extraocular muscles are normal.  No ptosis.    Symmetric facial strength and sensation.    PE/TP midline  Motor: Muscle bulk is normal.tone is increased in the right arm and minimally increased in the legs.  Strength is 4+/5 in the right arm, 5/5 in the left arm, 2+/5 in the right leg and 3 to 4-/5 in the left leg .   Reduced RAM in right hand  Sensation: Slight reduced sensation in the outer aspect of the left foot.  Gait and station: Requires support to stand.     He has foot drop bilaterally, worse on the right he cannot tandem walk  Reflexes: Deep tendon reflexes are trace at the left knee and trace at the right knee.No ankle clonus   ASSESSMENT  Multiple sclerosis (HCC)  Gait disturbance  High risk medication use  Presence of implanted infusion pump  Spastic diplegia (HCC)   PLAN: 1.  Continue the intrathecal pump : 1800 mcg/mL baclofen and 420 mcg/mL clonidine.  The continuous basal rate was increased.  He will be allowed to have four 30 mcg boluses a day with a 2-hour lockout.  Boluses will infuse over 5 minutes.   2.   Continue Tysabri every 4 weeks.   Check JCV antibody in June 3.   Continue other med's    4.   Return in 6 months or sooner if there are new or worsening neurologic issues.  We can make further adjustments if needed before the next refill  Michel Eskelson A. Epimenio Foot, MD, PhD, FAAN Certified in Neurology, Clinical Neurophysiology, Sleep Medicine, Pain Medicine and Neuroimaging Director, [facility] at [facility]  [facility] [address], [address] [place], [place] [address] [phone]

## 2021-02-12 NOTE — Telephone Encounter (Signed)
Received a fax from [place] stating that Tysabri 300 mg every 4 weeks has been approved from 02/11/2021 through 12/21/2021.  The servicing provider is [facility].  J code is [id number].

## 2021-02-12 NOTE — Telephone Encounter (Signed)
PTM: Max daily dose: lioresal 554.33mcg, clonidine129.23mcg Duration:6min, max activations: 4/day, lockout interval: 2hr  Infusion: simple continuous: increased 10% Lioresal: 484.67mcg/day, clonidine 113/41mcg/day

## 2021-02-13 NOTE — Telephone Encounter (Signed)
Cathy from [facility] called & asked do we want labs done for pt.'s infusion.   Best contact: [phone]

## 2021-02-14 ENCOUNTER — Telehealth: Payer: Self-pay | Admitting: Family Medicine

## 2021-02-14 NOTE — Telephone Encounter (Signed)
Are you ok with change?  Patient has a follow up in May with you and we can recheck at that time.

## 2021-02-14 NOTE — Telephone Encounter (Signed)
Caller: [facility] ([facility]) Call back # [phone] Ref # [id number]  States Synthroid (brand name is not cover under patient insurance. They could substitute for generic L-Thyroxine

## 2021-02-14 NOTE — Telephone Encounter (Signed)
Yes OK to change to generic as requested

## 2021-02-18 ENCOUNTER — Ambulatory Visit: Payer: Managed Care, Other (non HMO) | Admitting: Family Medicine

## 2021-02-18 MED ORDER — LEVOTHYROXINE SODIUM 150 MCG PO TABS: 150.0000 ug | ORAL_TABLET | Freq: Every day | ORAL | 1 refills | Status: DC

## 2021-02-18 NOTE — Telephone Encounter (Signed)
I called Keith Morrow from [name]. No answer, left a message asking her to call us back.  Per Dr. Epimenio Foot, pt does not need labs drawn other than a JCV in June.

## 2021-02-18 NOTE — Telephone Encounter (Signed)
Generic rx sent in.  

## 2021-02-19 NOTE — Telephone Encounter (Signed)
I called [facility].  I spoke with [facility].  I advised her that other than her JCV in June, patient does not need any routine blood work.  Unfortunately, they cannot draw JCV lab work.  Patient was infused on February 14, 2021 and is scheduled for his next Tysabri infusion on March 14, 2021.

## 2021-02-21 ENCOUNTER — Telehealth: Payer: Self-pay | Admitting: *Deleted

## 2021-02-21 NOTE — Telephone Encounter (Signed)
Received orders from [facility] for MD to sign. MD signed and I faxed back to them at [phone]. Received fax confirmation.

## 2021-02-22 ENCOUNTER — Ambulatory Visit (INDEPENDENT_AMBULATORY_CARE_PROVIDER_SITE_OTHER): Payer: 59 | Admitting: Psychology

## 2021-02-22 DIAGNOSIS — F4323 Adjustment disorder with mixed anxiety and depressed mood: Secondary | ICD-10-CM | POA: Diagnosis not present

## 2021-03-07 ENCOUNTER — Ambulatory Visit (INDEPENDENT_AMBULATORY_CARE_PROVIDER_SITE_OTHER): Payer: 59 | Admitting: Psychology

## 2021-03-07 DIAGNOSIS — F4323 Adjustment disorder with mixed anxiety and depressed mood: Secondary | ICD-10-CM

## 2021-03-08 ENCOUNTER — Ambulatory Visit: Payer: 59 | Admitting: Psychology

## 2021-03-20 ENCOUNTER — Other Ambulatory Visit: Payer: Self-pay | Admitting: *Deleted

## 2021-03-20 MED ORDER — CLONAZEPAM 1 MG PO TABS: 1.0000 mg | ORAL_TABLET | Freq: Four times a day (QID) | ORAL | 1 refills | Status: DC

## 2021-03-20 MED ORDER — GABAPENTIN 600 MG PO TABS: 600.0000 mg | ORAL_TABLET | Freq: Four times a day (QID) | ORAL | 3 refills | Status: DC

## 2021-03-21 ENCOUNTER — Ambulatory Visit (INDEPENDENT_AMBULATORY_CARE_PROVIDER_SITE_OTHER): Payer: 59 | Admitting: Psychology

## 2021-03-21 ENCOUNTER — Other Ambulatory Visit: Payer: Self-pay

## 2021-03-21 DIAGNOSIS — F4323 Adjustment disorder with mixed anxiety and depressed mood: Secondary | ICD-10-CM

## 2021-03-21 MED ORDER — ATORVASTATIN CALCIUM 40 MG PO TABS: 40.0000 mg | ORAL_TABLET | Freq: Every day | ORAL | 0 refills | Status: DC

## 2021-03-25 NOTE — Progress Notes (Signed)
Cardiology Office Note    Date:  04/02/2021   ID:  Keith Morrow, DOB [birth year], MRN [id number]   PCP:  [name], MD   [facility]  [name], MD  Advanced Practice Provider:  No care team member to display Electrophysiologist:  None   [id number]   Chief Complaint  Patient presents with  . Follow-up    History of Present Illness:  Keith Morrow is a 50 y.o. male with a hx of multiple sclerosis, hypertension, mixed hyperlipidemia, and coronary artery disease status post NSTEMI 2012 treated with DES to the LAD, PTCA of the diagonal and staged PCI of the posterior AV segment of RCA.  LVEF preserved, NST 2017 inferior scar with mild inferior ischemia medical therapy recommended.  Echo 2021 normal LV function normal RV function no valvular disease.  Last saw Dr. Excell Seltzer 04/02/2020 with chronic leg swelling managed with Lasix, HCTZ and compression hose.  Patient comes in for f/u. Walking significantly less from MS and can only walk several steps. Has gained 20 lbs in the past year. Denies chest pain, dyspnea, dizziness or presyncope. Chronic edema but has to wear compression hose. Only using lasix prn and hasn't used in 6 months. Says he gets a lot of salt in his diet. Eating a lot of take out. 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     Past Medical History:  Diagnosis Date  . Acute bronchitis 05/09/2013  . Allergy   . Bronchitis, acute 12/10/2011  . Candidal skin infection 05/05/2017  . Chicken pox as a child  . Constipation 02/20/2017  . Coronary atherosclerosis of native coronary artery    a. NSTEMI 04/20/11: tx with Promus DES to LAD; bifurcation lesion with oDx 90% tx with POBA; b. staged PCI of right post. AV Branch with Promus DES; c. residual at cath 04/21/11:  AV groove CFX 50%, mRCA 30%,; EF 55%  . Depression   . Depression with anxiety   . Dermatitis, contact 12/10/2011  .  Fatigue 10/15/2012  . Heart attack (HCC) 04-20-11  . HTN (hypertension)   . Hyperlipidemia   . Hypotestosteronism 11/17/2011  . Hypothyroidism   . Keloid scar   . MS (multiple sclerosis) (HCC)   . Neuromuscular disorder (HCC)    NUMBNESS/TINGLING  . Obesity   . Onychomycosis   . Preventative health care 10/07/2011  . Reflux 10/07/2011  . Sleep apnea   . Sun-damaged skin 02/17/2016  . Testosterone deficiency 01/16/2012  . Tinea pedis   . 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 Visual changes 11/13/2016    Past Surgical History:  Procedure Laterality Date  . CORONARY ANGIOPLASTY WITH STENT PLACEMENT     2012  . PAIN PUMP IMPLANTATION N/A 08/29/2014   Procedure: Baclofen pump placement;  Surgeon: Maeola Harman, MD;  Location: [facility];  Service: Neurosurgery;  Laterality: N/A;  Baclofen pump placement  . PAIN PUMP IMPLANTATION Right 09/20/2020   Procedure: Right Baclofen pump replacement;  Surgeon: Maeola Harman, MD;  Location: [facility] OR;  Service: Neurosurgery;  Laterality: Right;  Right Baclofen pump replacement  . PROGRAMABLE BACLOFEN PUMP REVISION  08/29/14  . right wrist surgery     CTR  . VASCULAR SURGERY     vericose vein in right femoral area  . VASECTOMY    . WISDOM TOOTH EXTRACTION      Current Medications: Current Meds  Medication  Sig  . aspirin EC 81 MG tablet Take 81 mg by mouth daily.  Marland Kitchen BACLOFEN IT 1 Dose by Intrathecal route as directed.   . busPIRone (BUSPAR) 10 MG tablet Take 1 tablet (10 mg total) by mouth 3 (three) times daily.  . cetirizine (ZYRTEC) 10 MG tablet Take 10 mg by mouth daily as needed for allergies.  . clonazePAM (KLONOPIN) 1 MG tablet Take 1 tablet (1 mg total) by mouth 4 (four) times daily.  . Clonidine HCl POWD by Does not apply route. 720mg /ml IT Baclofen. Total dose 157.89mg /day. Last pump refill 06/02/16  . clotrimazole-betamethasone (LOTRISONE) cream Apply 1 application topically 2 (two) times daily as needed (anti-fungal).  .  cyclobenzaprine (FLEXERIL) 10 MG tablet TAKE 1 TABLET BY MOUTH THREE TIMES A DAY AS NEEDED FOR MUSCLE SPASMS  . dalfampridine 10 MG TB12 TAKE 1 TABLET (10 MG TOTAL) EVERY 12 HOURS  . DULoxetine (CYMBALTA) 60 MG capsule TAKE 1 CAPSULE TWICE A DAY  . famotidine (PEPCID) 40 MG tablet TAKE 1 TABLET BY MOUTH EVERYDAY AT BEDTIME  . gabapentin (NEURONTIN) 600 MG tablet Take 1 tablet (600 mg total) by mouth 4 (four) times daily.  ibuprofen (ADVIL,MOTRIN) 200 MG tablet Take 200 mg by mouth every 6 (six) hours as needed for moderate pain.  06/04/16 ketoconazole (NIZORAL) 2 % shampoo Apply topically.  Marland Kitchen levothyroxine (SYNTHROID) 150 MCG tablet Take 1 tablet (150 mcg total) by mouth daily before breakfast.  . methylphenidate (RITALIN) 10 MG tablet Take 1 tablet (10 mg total) by mouth 4 (four) times daily.  . natalizumab (TYSABRI) 300 MG/15ML injection Inject 300 mg into the vein every 28 (twenty-eight) days.   . nitroGLYCERIN (NITROSTAT) 0.4 MG SL tablet PLACE 1 TABLET UNDER THE TOUNGE EVERY 5 MINUTES AS NEEDED FOR CHEST PAIN (X 3 DOSES)  . nystatin cream (MYCOSTATIN) Apply 1 application topically as directed.  . olmesartan-hydrochlorothiazide (BENICAR HCT) 20-12.5 MG tablet Take 1 tablet by mouth daily.  Marland Kitchen omeprazole (PRILOSEC) 40 MG capsule Take 1 capsule (40 mg total) by mouth daily.  . sucralfate (CARAFATE) 1 g tablet TAKE 1 TABLET BY MOUTH 4 TIMES DAILY - WITH MEALS AND AT BEDTIME. TAKE TABLET OR LIQUID BUT NOT BOTH  . sucralfate (CARAFATE) 1 GM/10ML suspension Take 10 mLs (1 g total) by mouth 4 (four) times daily -  with meals and at bedtime. Take liquid or tablets but not both  . tamsulosin (FLOMAX) 0.4 MG CAPS capsule Take 1 capsule (0.4 mg total) by mouth daily.  . [DISCONTINUED] atorvastatin (LIPITOR) 40 MG tablet Take 1 tablet (40 mg total) by mouth daily at 6 PM. Please keep upcoming appt in April 2022 before anymore refills. Thank you  . [DISCONTINUED] furosemide (LASIX) 20 MG tablet 1 ta po daily x 5  days and then as needed for SOB, weight gain>3#, pedal edema and can take a second tab daily as needed  . [DISCONTINUED] potassium chloride SA (KLOR-CON M20) 20 MEQ tablet TAKE 1 TABLET BY MOUTH EVERY DAY AS NEEDED WHEN TAKING A SECOND LASIX TAB ANY GIVEN DAY     Allergies:   Patient has no known allergies.   Social History   Socioeconomic History  . Marital status: Married    Spouse name: Not on file  . Number of children: 3  . Years of education: Not on file  . Highest education level: Not on file  Occupational History  . Not on file  Tobacco Use  . Smoking status: Never Smoker  .  Smokeless tobacco: Never Used  Vaping Use  . Vaping Use: Never used  Substance and Sexual Activity  . Alcohol use: Yes    Comment: socially  . Drug use: No  . Sexual activity: Yes    Partners: Female    Comment: 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, works Engineer, [employer], no dietary restrictions  Other Topics Concern  . Not on file  Social History Narrative  . Not on file   Social Determinants of Health   Financial Resource Strain: Not on file  Food Insecurity: Not on file  Transportation Needs: Not on file  Physical Activity: Not on file  Stress: Not on file  Social Connections: Not on file     Family History:  The patient's family history includes Coronary artery disease in an other family member; Heart attack (age of onset: 8065) in his mother; Heart disease in his father, maternal grandfather, maternal grandmother, and mother; Hyperlipidemia in his maternal grandfather and maternal grandmother; Hypertension in his maternal grandfather and maternal grandmother; Stroke in his paternal grandfather; Thyroid disease in his brother, mother, sister, and sister.   ROS:   Please see the history of present illness.    ROS All other systems reviewed and are negative.   PHYSICAL EXAM:   VS:  BP 140/70   Pulse 89   Ht 6\' 3"  (1.905 m)   Wt (!) 322 lb 6.4 oz (146.2 kg)   SpO2 97%   BMI 40.30 kg/m    Physical Exam  GEN: Obese, in no acute distress  Neck: no JVD, carotid bruits, or masses Cardiac:RRR; no murmurs, rubs, or gallops  Respiratory:  clear to auscultation bilaterally, normal work of breathing GI: soft, nontender, nondistended, + BS Ext: trace edema,without cyanosis, clubbing, Good distal pulses bilaterally Neuro:  Alert and Oriented x 3 Psych: euthymic mood, full affect  Wt Readings from Last 3 Encounters:  04/02/21 (!) 322 lb 6.4 oz (146.2 kg)  10/08/20 299 lb 6.4 oz (135.8 kg)  09/20/20 290 lb (131.5 kg)      Studies/Labs Reviewed:   EKG:  EKG is ordered today.  The ekg ordered today demonstrates NSR, normal EKg  Recent Labs: 07/03/2020: ALT 23; TSH 0.54 09/18/2020: BUN 14; Creatinine, Ser 0.92; Potassium 4.1; Sodium 140 11/22/2020: Hemoglobin 13.9; Platelets 194   Lipid Panel    Component Value Date/Time   CHOL 137 07/03/2020 1143   TRIG 50.0 07/03/2020 1143   HDL 51.50 07/03/2020 1143   CHOLHDL 3 07/03/2020 1143   VLDL 10.0 07/03/2020 1143   LDLCALC 75 07/03/2020 1143    Additional studies/ records that were reviewed today include:   Echo 01/11/20 IMPRESSIONS     1. Left ventricular ejection fraction, by visual estimation, is 60 to  65%. The left ventricle has normal function. There is no left ventricular  hypertrophy.   2. The left ventricle has no regional wall motion abnormalities.   3. Global right ventricle has normal systolic function.The right  ventricular size is normal. No increase in right ventricular wall  thickness.   4. Left atrial size was normal.   5. Right atrial size was normal.   6. The mitral valve is normal in structure. Trivial mitral valve  regurgitation. No evidence of mitral stenosis.   7. The tricuspid valve is normal in structure. Tricuspid valve  regurgitation is not demonstrated.   8. The aortic valve is normal in structure. Aortic valve regurgitation is  trivial. No evidence of aortic valve sclerosis or stenosis.  9. The pulmonic valve was not well visualized. Pulmonic valve  regurgitation is not visualized.  10. Unable to determine Pulmonary Artery Systolic Pressure, No TR jet  spectral display.     Risk Assessment/Calculations:         ASSESSMENT:    1. Coronary artery disease involving native coronary artery of native heart without angina pectoris   2. Essential hypertension   3. Hyperlipidemia, unspecified hyperlipidemia type   4. Lower extremity edema   5. Multiple sclerosis (HCC)      PLAN:  In order of problems listed above:  CAD status post NSTEMI 2012 treated with DES to the LAD, PTCA of the diagonal and staged PCI of the posterior AV segment of RCA.  LVEF preserved, NST 2017 inferior scar with mild inferior ischemia medical therapy recommended. Echo 01/2020 normal LVEF 60-65%. No angina  Hypertension BP controlled  Hyperlipidemia check labs today  Chronic lower extremity edema-hasn't used lasix in over 6 months because it didn't seem to be working-he thinks it's old. Getting extra salt in his diet-2 gm sodium. Will check labs today and refill lasix and K to take prn. No significant edema today.   MS-in a wheelchair and having a harder time walking. Has gained 20 lbs in the past year.  Shared Decision Making/Informed Consent        Medication Adjustments/Labs and Tests Ordered: Current medicines are reviewed at length with the patient today.  Concerns regarding medicines are outlined above.  Medication changes, Labs and Tests ordered today are listed in the Patient Instructions below. Patient Instructions   Medication Instructions:  Your physician recommends that you continue on your current medications as directed. Please refer to the Current Medication list given to you today.  *If you need a refill on your cardiac medications before your next appointment, please call your pharmacy*   Lab Work: Cmp, Cbc, Lipids, Tsh- Today   If you have labs (blood work) drawn  today and your tests are completely normal, you will receive your results only by: Marland Kitchen MyChart Message (if you have MyChart) OR . A paper copy in the mail If you have any lab test that is abnormal or we need to change your treatment, we will call you to review the results.   Testing/Procedures: None ordered   Follow-Up: At [facility], you and your health needs are our priority.  As part of our continuing mission to provide you with exceptional heart care, we have created designated Provider Care Teams.  These Care Teams include your primary Cardiologist (physician) and Advanced Practice Providers (APPs -  Physician Assistants and Nurse Practitioners) who all work together to provide you with the care you need, when you need it.  We recommend signing up for the patient portal called "MyChart".  Sign up information is provided on this After Visit Summary.  MyChart is used to connect with patients for Virtual Visits (Telemedicine).  Patients are able to view lab/test results, encounter notes, upcoming appointments, etc.  Non-urgent messages can be sent to your provider as well.   To learn more about what you can do with MyChart, go to [email or web address].    Your next appointment:   12 month(s)  The format for your next appointment:   In Person  Provider:   You may see Tonny Bollman, MD or one of the following Advanced Practice Providers on your designated Care Team:    [name], PA-C  Chelsea Aus, [place]    Other Instructions Two Gram Sodium  Diet 2000 mg  What is Sodium? Sodium is a mineral found naturally in many foods. The most significant source of sodium in the diet is table salt, which is about 40% sodium.  Processed, convenience, and preserved foods also contain a large amount of sodium.  The body needs only 500 mg of sodium daily to function,  A normal diet provides more than enough sodium even if you do not use salt.  Why Limit Sodium? A build up of sodium in the  body can cause thirst, increased blood pressure, shortness of breath, and water retention.  Decreasing sodium in the diet can reduce edema and risk of heart attack or stroke associated with high blood pressure.  Keep in mind that there are many other factors involved in these health problems.  Heredity, obesity, lack of exercise, cigarette smoking, stress and what you eat all play a role.  General Guidelines:  Do not add salt at the table or in cooking.  One teaspoon of salt contains over 2 grams of sodium.  Read food labels  Avoid processed and convenience foods  Ask your dietitian before eating any foods not dicussed in the menu planning guidelines  Consult your physician if you wish to use a salt substitute or a sodium containing medication such as antacids.  Limit milk and milk products to 16 oz (2 cups) per day.  Shopping Hints:  READ LABELS!! "Dietetic" does not necessarily mean low sodium.  Salt and other sodium ingredients are often added to foods during processing.   Menu Planning Guidelines Food Group Choose More Often Avoid  Beverages (see also the milk group All fruit juices, low-sodium, salt-free vegetables juices, low-sodium carbonated beverages Regular vegetable or tomato juices, commercially softened water used for drinking or cooking  Breads and Cereals Enriched white, wheat, rye and pumpernickel bread, hard rolls and dinner rolls; muffins, cornbread and waffles; most dry cereals, cooked cereal without added salt; unsalted crackers and breadsticks; low sodium or homemade bread crumbs Bread, rolls and crackers with salted tops; quick breads; instant hot cereals; pancakes; commercial bread stuffing; self-rising flower and biscuit mixes; regular bread crumbs or cracker crumbs  Desserts and Sweets Desserts and sweets mad with mild should be within allowance Instant pudding mixes and cake mixes  Fats Butter or margarine; vegetable oils; unsalted salad dressings, regular salad  dressings limited to 1 Tbs; light, sour and heavy cream Regular salad dressings containing bacon fat, bacon bits, and salt pork; snack dips made with instant soup mixes or processed cheese; salted nuts  Fruits Most fresh, frozen and canned fruits Fruits processed with salt or sodium-containing ingredient (some dried fruits are processed with sodium sulfites        Vegetables Fresh, frozen vegetables and low- sodium canned vegetables Regular canned vegetables, sauerkraut, pickled vegetables, and others prepared in brine; frozen vegetables in sauces; vegetables seasoned with ham, bacon or salt pork  Condiments, Sauces, Miscellaneous  Salt substitute with physician's approval; pepper, herbs, spices; vinegar, lemon or lime juice; hot pepper sauce; garlic powder, onion powder, low sodium soy sauce (1 Tbs.); low sodium condiments (ketchup, chili sauce, mustard) in limited amounts (1 tsp.) fresh ground horseradish; unsalted tortilla chips, pretzels, potato chips, popcorn, salsa (1/4 cup) Any seasoning made with salt including garlic salt, celery salt, onion salt, and seasoned salt; sea salt, rock salt, kosher salt; meat tenderizers; monosodium glutamate; mustard, regular soy sauce, barbecue, sauce, chili sauce, teriyaki sauce, steak sauce, Worcestershire sauce, and most flavored vinegars; canned gravy and mixes; regular condiments;  salted snack foods, olives, picles, relish, horseradish sauce, catsup   Food preparation: Try these seasonings Meats:    Pork Sage, onion Serve with applesauce  Chicken Poultry seasoning, thyme, parsley Serve with cranberry sauce  Lamb Curry powder, rosemary, garlic, thyme Serve with mint sauce or jelly  Veal Marjoram, basil Serve with current jelly, cranberry sauce  Beef Pepper, bay leaf Serve with dry mustard, unsalted chive butter  Fish [place] leaf, dill Serve with unsalted lemon butter, unsalted parsley butter  Vegetables:    Asparagus Lemon juice   Broccoli Lemon juice    Carrots Mustard dressing parsley, mint, nutmeg, glazed with unsalted butter and sugar   Green beans Marjoram, lemon juice, nutmeg,dill seed   Tomatoes Basil, marjoram, onion   Spice /blend for [employer]" 4 tsp ground thyme 1 tsp ground sage 3 tsp ground rosemary 4 tsp ground marjoram   Test your knowledge 1. A product that says "Salt Free" may still contain sodium. True or False 2. Garlic Powder and Hot Pepper Sauce an be used as alternative seasonings.True or False 3. Processed foods have more sodium than fresh foods.  True or False 4. Canned Vegetables have less sodium than froze True or False  WAYS TO DECREASE YOUR SODIUM INTAKE 1. Avoid the use of added salt in cooking and at the table.  Table salt (and other prepared seasonings which contain salt) is probably one of the greatest sources of sodium in the diet.  Unsalted foods can gain flavor from the sweet, sour, and butter taste sensations of herbs and spices.  Instead of using salt for seasoning, try the following seasonings with the foods listed.  Remember: how you use them to enhance natural food flavors is limited only by your creativity... Allspice-Meat, fish, eggs, fruit, peas, red and yellow vegetables Almond Extract-Fruit baked goods Anise Seed-Sweet breads, fruit, carrots, beets, cottage cheese, cookies (tastes like licorice) Basil-Meat, fish, eggs, vegetables, rice, vegetables salads, soups, sauces Bay Leaf-Meat, fish, stews, poultry Burnet-Salad, vegetables (cucumber-like flavor) Caraway Seed-Bread, cookies, cottage cheese, meat, vegetables, cheese, rice Cardamon-Baked goods, fruit, soups Celery Powder or seed-Salads, salad dressings, sauces, meatloaf, soup, bread.Do not use  celery salt Chervil-Meats, salads, fish, eggs, vegetables, cottage cheese (parsley-like flavor) Chili Power-Meatloaf, chicken cheese, corn, eggplant, egg dishes Chives-Salads cottage cheese, egg dishes, soups, vegetables, sauces Cilantro-Salsa,  casseroles Cinnamon-Baked goods, fruit, pork, lamb, chicken, carrots Cloves-Fruit, baked goods, fish, pot roast, green beans, beets, carrots Coriander-Pastry, cookies, meat, salads, cheese (lemon-orange flavor) Cumin-Meatloaf, fish,cheese, eggs, cabbage,fruit pie (caraway flavor) [employer], fruit, eggs, fish, poultry, cottage cheese, vegetables Dill Seed-Meat, cottage cheese, poultry, vegetables, fish, salads, bread Fennel Seed-Bread, cookies, apples, pork, eggs, fish, beets, cabbage, cheese, Licorice-like flavor Garlic-(buds or powder) Salads, meat, poultry, fish, bread, butter, vegetables, potatoes.Do not  use garlic salt Ginger-Fruit, vegetables, baked goods, meat, fish, poultry Horseradish Root-Meet, vegetables, butter Lemon Juice or Extract-Vegetables, fruit, tea, baked goods, fish salads Mace-Baked goods fruit, vegetables, fish, poultry (taste like nutmeg) Maple Extract-Syrups Marjoram-Meat, chicken, fish, vegetables, breads, green salads (taste like Sage) Mint-Tea, lamb, sherbet, vegetables, desserts, carrots, cabbage Mustard, Dry or Seed-Cheese, eggs, meats, vegetables, poultry Nutmeg-Baked goods, fruit, chicken, eggs, vegetables, desserts Onion Powder-Meat, fish, poultry, vegetables, cheese, eggs, bread, rice salads (Do not use   Onion salt) Orange Extract-Desserts, baked goods Oregano-Pasta, eggs, cheese, onions, pork, lamb, fish, chicken, vegetables, green salads Paprika-Meat, fish, poultry, eggs, cheese, vegetables Parsley Flakes-Butter, vegetables, meat fish, poultry, eggs, bread, salads (certain forms may   Contain sodium Pepper-Meat fish, poultry, vegetables, eggs Peppermint  Extract-Desserts, baked goods [employer], bread, cheese, fruit dressings, baked goods, noodles, vegetables, cottage  [employer], poultry, meat, fish, cauliflower, turnips,eggs bread Saffron-Rice, bread, veal, chicken, fish, eggs Sage-Meat, fish,  poultry, onions, eggplant, tomateos, pork, stews Savory-Eggs, salads, poultry, meat, rice, vegetables, soups, pork Tarragon-Meat, poultry, fish, eggs, butter, vegetables (licorice-like flavor)  Thyme-Meat, poultry, fish, eggs, vegetables, (clover-like flavor), sauces, soups Tumeric-Salads, butter, eggs, fish, rice, vegetables (saffron-like flavor) Vanilla Extract-Baked goods, candy Vinegar-Salads, vegetables, meat marinades Walnut Extract-baked goods, candy  2. Choose your Foods Wisely   The following is a list of foods to avoid which are high in sodium:  Meats-Avoid all smoked, canned, salt cured, dried and kosher meat and fish as well as Anchovies   [employer] meats:Bologna, Liverwurst, Pastrami Canned meat or fish  Marinated herring Caviar    Pepperoni Corned Beef   Pizza Dried chipped beef  Salami Frozen breaded fish or meat Salt pork Frankfurters or hot dogs  Sardines Gefilte fish   Sausage Ham (boiled ham, Proscuitto Smoked butt    spiced ham)   Spam      TV Dinners Vegetables Canned vegetables (Regular) Relish Canned mushrooms  Sauerkraut Olives    Tomato juice Pickles  Bakery and Dessert Products Canned puddings  Cream pies Cheesecake   Decorated cakes Cookies  Beverages/Juices Tomato juice, regular  Gatorade   V-8 vegetable juice, regular  Breads and Cereals Biscuit mixes   Salted potato chips, corn chips, pretzels Bread stuffing mixes  Salted crackers and rolls Pancake and waffle mixes Self-rising flour  Seasonings Accent    Meat sauces Barbecue sauce  Meat tenderizer Catsup    Monosodium glutamate (MSG) Celery salt   Onion salt Chili sauce   Prepared mustard Garlic salt   Salt, seasoned salt, sea salt Gravy mixes   Soy sauce Horseradish   Steak sauce Ketchup   Tartar sauce Lite salt    Teriyaki sauce Marinade mixes   Worcestershire sauce  Others Baking powder   Cocoa and cocoa mixes Baking soda   Commercial casserole  mixes Candy-caramels, chocolate  Dehydrated soups    Bars, fudge,nougats  Instant rice and pasta mixes Canned broth or soup  Maraschino cherries Cheese, aged and processed cheese and cheese spreads  Learning Assessment Quiz  Indicated T (for True) or F (for False) for each of the following statements:  1. _____ Fresh fruits and vegetables and unprocessed grains are generally low in sodium 2. _____ Water may contain a considerable amount of sodium, depending on the source 3. _____ You can always tell if a food is high in sodium by tasting it 4. _____ Certain laxatives my be high in sodium and should be avoided unless prescribed   by a physician or pharmacist 5. _____ Salt substitutes may be used freely by anyone on a sodium restricted diet 6. _____ Sodium is present in table salt, food additives and as a natural component of   most foods 7. _____ Table salt is approximately 90% sodium 8. _____ Limiting sodium intake may help prevent excess fluid accumulation in the body 9. _____ On a sodium-restricted diet, seasonings such as bouillon soy sauce, and    cooking wine should be used in place of table salt 10. _____ On an ingredient list, a product which lists monosodium glutamate as the first   ingredient is an appropriate food to include on a low sodium diet  Circle the best answer(s) to the following statements (Hint: there may be  more than one correct answer)  11. On a low-sodium diet, some acceptable snack items are:    A. Olives  F. Bean dip   K. Grapefruit juice    B. Salted Pretzels G. Commercial Popcorn   L. Canned peaches    C. Carrot Sticks  H. Bouillon   M. Unsalted nuts   D. [place] fries  I. Peanut butter crackers N. Salami   E. Sweet pickles J. Tomato Juice   O. Pizza  12.  Seasonings that may be used freely on a reduced - sodium diet include   A. Lemon wedges F.Monosodium glutamate K. Celery seed    B.Soysauce   G. Pepper   L. Mustard powder   C. Sea salt  H.  Cooking wine  M. Onion flakes   D. Vinegar  E. Prepared horseradish N. Salsa   E. Sage   J. Worcestershire sauce  O. [address]      Elson Clan, PA-C  04/02/2021 9:14 AM    [facility] [address] [place], [place], [place]  [address] Phone: [phone]; Fax: [phone]

## 2021-03-26 ENCOUNTER — Other Ambulatory Visit: Payer: Self-pay | Admitting: *Deleted

## 2021-03-26 ENCOUNTER — Other Ambulatory Visit: Payer: Self-pay | Admitting: Family Medicine

## 2021-03-26 MED ORDER — OMEPRAZOLE 40 MG PO CPDR: 40.0000 mg | DELAYED_RELEASE_CAPSULE | Freq: Every day | ORAL | 3 refills | Status: DC

## 2021-03-26 MED ORDER — FAMOTIDINE 40 MG PO TABS: ORAL_TABLET | ORAL | 3 refills | Status: DC

## 2021-03-26 MED ORDER — BUSPIRONE HCL 10 MG PO TABS: 10.0000 mg | ORAL_TABLET | Freq: Three times a day (TID) | ORAL | 1 refills | Status: DC

## 2021-03-26 MED ORDER — CYMBALTA 60 MG PO CPEP: 60.0000 mg | ORAL_CAPSULE | Freq: Two times a day (BID) | ORAL | 1 refills | Status: DC

## 2021-03-26 MED ORDER — OLMESARTAN MEDOXOMIL-HCTZ 20-12.5 MG PO TABS: 1.0000 | ORAL_TABLET | Freq: Every day | ORAL | 1 refills | Status: DC

## 2021-03-26 MED ORDER — TAMSULOSIN HCL 0.4 MG PO CAPS: 0.4000 mg | ORAL_CAPSULE | Freq: Every day | ORAL | 3 refills | Status: DC

## 2021-04-02 ENCOUNTER — Ambulatory Visit (INDEPENDENT_AMBULATORY_CARE_PROVIDER_SITE_OTHER): Payer: Managed Care, Other (non HMO) | Admitting: Physician Assistant

## 2021-04-02 ENCOUNTER — Other Ambulatory Visit: Payer: Self-pay

## 2021-04-02 ENCOUNTER — Encounter: Payer: Self-pay | Admitting: Physician Assistant

## 2021-04-02 ENCOUNTER — Telehealth: Payer: Self-pay | Admitting: Family Medicine

## 2021-04-02 VITALS — BP 140/70 | HR 89 | Ht 75.0 in | Wt 322.4 lb

## 2021-04-02 DIAGNOSIS — I2510 Atherosclerotic heart disease of native coronary artery without angina pectoris: Secondary | ICD-10-CM

## 2021-04-02 DIAGNOSIS — I10 Essential (primary) hypertension: Secondary | ICD-10-CM | POA: Diagnosis not present

## 2021-04-02 DIAGNOSIS — E785 Hyperlipidemia, unspecified: Secondary | ICD-10-CM | POA: Diagnosis not present

## 2021-04-02 DIAGNOSIS — R600 Localized edema: Secondary | ICD-10-CM

## 2021-04-02 DIAGNOSIS — G35 Multiple sclerosis: Secondary | ICD-10-CM

## 2021-04-02 LAB — CBC
Hematocrit: 40.4 % (ref 37.5–51.0)
Hemoglobin: 13.4 g/dL (ref 13.0–17.7)
MCH: 30 pg (ref 26.6–33.0)
MCHC: 33.2 g/dL (ref 31.5–35.7)
MCV: 90 fL (ref 79–97)
Platelets: 181 10*3/uL (ref 150–450)
RBC: 4.47 x10E6/uL (ref 4.14–5.80)
RDW: 14 % (ref 11.6–15.4)
WBC: 7.3 10*3/uL (ref 3.4–10.8)

## 2021-04-02 LAB — COMPREHENSIVE METABOLIC PANEL
ALT: 26 IU/L (ref 0–44)
AST: 20 IU/L (ref 0–40)
Albumin/Globulin Ratio: 2.2 (ref 1.2–2.2)
Albumin: 4.4 g/dL (ref 4.0–5.0)
Alkaline Phosphatase: 90 IU/L (ref 44–121)
BUN/Creatinine Ratio: 16 (ref 9–20)
BUN: 14 mg/dL (ref 6–24)
Bilirubin Total: 0.5 mg/dL (ref 0.0–1.2)
CO2: 28 mmol/L (ref 20–29)
Calcium: 9.3 mg/dL (ref 8.7–10.2)
Chloride: 99 mmol/L (ref 96–106)
Creatinine, Ser: 0.9 mg/dL (ref 0.76–1.27)
Globulin, Total: 2 g/dL (ref 1.5–4.5)
Glucose: 111 mg/dL — ABNORMAL HIGH (ref 65–99)
Potassium: 4.4 mmol/L (ref 3.5–5.2)
Sodium: 138 mmol/L (ref 134–144)
Total Protein: 6.4 g/dL (ref 6.0–8.5)
eGFR: 105 mL/min/{1.73_m2} (ref >59)

## 2021-04-02 LAB — LIPID PANEL
Chol/HDL Ratio: 2.4 ratio (ref 0.0–5.0)
Cholesterol, Total: 159 mg/dL (ref 100–199)
HDL: 65 mg/dL (ref >39)
LDL Chol Calc (NIH): 82 mg/dL (ref 0–99)
Triglycerides: 60 mg/dL (ref 0–149)
VLDL Cholesterol Cal: 12 mg/dL (ref 5–40)

## 2021-04-02 LAB — TSH: TSH: 1.51 u[IU]/mL (ref 0.450–4.500)

## 2021-04-02 MED ORDER — POTASSIUM CHLORIDE CRYS ER 20 MEQ PO TBCR: EXTENDED_RELEASE_TABLET | ORAL | 3 refills | Status: AC

## 2021-04-02 MED ORDER — FUROSEMIDE 20 MG PO TABS: ORAL_TABLET | ORAL | 3 refills | Status: DC

## 2021-04-02 MED ORDER — ATORVASTATIN CALCIUM 40 MG PO TABS
40.0000 mg | ORAL_TABLET | Freq: Every day | ORAL | 3 refills | Status: DC
Start: 2021-04-02 — End: 2021-04-25

## 2021-04-02 MED ORDER — DULOXETINE HCL 60 MG PO CPEP: 60.0000 mg | ORAL_CAPSULE | Freq: Every day | ORAL | 3 refills | Status: DC

## 2021-04-02 NOTE — Telephone Encounter (Signed)
Pharmacist called stating patient prefer brand name medication. Please resend   DULoxetine (CYMBALTA) 60 MG capsule [[id number]]    [facility] - [place], [place] - [address]  [address], [place] [address]  Phone:  [phone] Fax:  [phone]

## 2021-04-02 NOTE — Patient Instructions (Signed)
Medication Instructions:  Your physician recommends that you continue on your current medications as directed. Please refer to the Current Medication list given to you today.  *If you need a refill on your cardiac medications before your next appointment, please call your pharmacy*   Lab Work: Cmp, Cbc, Lipids, Tsh- Today   If you have labs (blood work) drawn today and your tests are completely normal, you will receive your results only by: Marland Kitchen MyChart Message (if you have MyChart) OR . A paper copy in the mail If you have any lab test that is abnormal or we need to change your treatment, we will call you to review the results.   Testing/Procedures: None ordered   Follow-Up: At [facility], you and your health needs are our priority.  As part of our continuing mission to provide you with exceptional heart care, we have created designated Provider Care Teams.  These Care Teams include your primary Cardiologist (physician) and Advanced Practice Providers (APPs -  Physician Assistants and Nurse Practitioners) who all work together to provide you with the care you need, when you need it.  We recommend signing up for the patient portal called "MyChart".  Sign up information is provided on this After Visit Summary.  MyChart is used to connect with patients for Virtual Visits (Telemedicine).  Patients are able to view lab/test results, encounter notes, upcoming appointments, etc.  Non-urgent messages can be sent to your provider as well.   To learn more about what you can do with MyChart, go to [email or web address].    Your next appointment:   12 month(s)  The format for your next appointment:   In Person  Provider:   You may see Tonny Bollman, MD or one of the following Advanced Practice Providers on your designated Care Team:    [name], PA-C  Vin [place], [place]    Other Instructions Two Gram Sodium Diet 2000 mg  What is Sodium? Sodium is a mineral found naturally in  many foods. The most significant source of sodium in the diet is table salt, which is about 40% sodium.  Processed, convenience, and preserved foods also contain a large amount of sodium.  The body needs only 500 mg of sodium daily to function,  A normal diet provides more than enough sodium even if you do not use salt.  Why Limit Sodium? A build up of sodium in the body can cause thirst, increased blood pressure, shortness of breath, and water retention.  Decreasing sodium in the diet can reduce edema and risk of heart attack or stroke associated with high blood pressure.  Keep in mind that there are many other factors involved in these health problems.  Heredity, obesity, lack of exercise, cigarette smoking, stress and what you eat all play a role.  General Guidelines:  Do not add salt at the table or in cooking.  One teaspoon of salt contains over 2 grams of sodium.  Read food labels  Avoid processed and convenience foods  Ask your dietitian before eating any foods not dicussed in the menu planning guidelines  Consult your physician if you wish to use a salt substitute or a sodium containing medication such as antacids.  Limit milk and milk products to 16 oz (2 cups) per day.  Shopping Hints:  READ LABELS!! "Dietetic" does not necessarily mean low sodium.  Salt and other sodium ingredients are often added to foods during processing.   Menu Planning Guidelines Food Group Choose More Often Avoid  Beverages (see also the milk group All fruit juices, low-sodium, salt-free vegetables juices, low-sodium carbonated beverages Regular vegetable or tomato juices, commercially softened water used for drinking or cooking  Breads and Cereals Enriched white, wheat, rye and pumpernickel bread, hard rolls and dinner rolls; muffins, cornbread and waffles; most dry cereals, cooked cereal without added salt; unsalted crackers and breadsticks; low sodium or homemade bread crumbs Bread, rolls and crackers  with salted tops; quick breads; instant hot cereals; pancakes; commercial bread stuffing; self-rising flower and biscuit mixes; regular bread crumbs or cracker crumbs  Desserts and Sweets Desserts and sweets mad with mild should be within allowance Instant pudding mixes and cake mixes  Fats Butter or margarine; vegetable oils; unsalted salad dressings, regular salad dressings limited to 1 Tbs; light, sour and heavy cream Regular salad dressings containing bacon fat, bacon bits, and salt pork; snack dips made with instant soup mixes or processed cheese; salted nuts  Fruits Most fresh, frozen and canned fruits Fruits processed with salt or sodium-containing ingredient (some dried fruits are processed with sodium sulfites        Vegetables Fresh, frozen vegetables and low- sodium canned vegetables Regular canned vegetables, sauerkraut, pickled vegetables, and others prepared in brine; frozen vegetables in sauces; vegetables seasoned with ham, bacon or salt pork  Condiments, Sauces, Miscellaneous  Salt substitute with physician's approval; pepper, herbs, spices; vinegar, lemon or lime juice; hot pepper sauce; garlic powder, onion powder, low sodium soy sauce (1 Tbs.); low sodium condiments (ketchup, chili sauce, mustard) in limited amounts (1 tsp.) fresh ground horseradish; unsalted tortilla chips, pretzels, potato chips, popcorn, salsa (1/4 cup) Any seasoning made with salt including garlic salt, celery salt, onion salt, and seasoned salt; sea salt, rock salt, kosher salt; meat tenderizers; monosodium glutamate; mustard, regular soy sauce, barbecue, sauce, chili sauce, teriyaki sauce, steak sauce, Worcestershire sauce, and most flavored vinegars; canned gravy and mixes; regular condiments; salted snack foods, olives, picles, relish, horseradish sauce, catsup   Food preparation: Try these seasonings Meats:    Pork Sage, onion Serve with applesauce  Chicken Poultry seasoning, thyme, parsley Serve with  cranberry sauce  Lamb Curry powder, rosemary, garlic, thyme Serve with mint sauce or jelly  Veal Marjoram, basil Serve with current jelly, cranberry sauce  Beef Pepper, bay leaf Serve with dry mustard, unsalted chive butter  Fish [place] leaf, dill Serve with unsalted lemon butter, unsalted parsley butter  Vegetables:    Asparagus Lemon juice   Broccoli Lemon juice   Carrots Mustard dressing parsley, mint, nutmeg, glazed with unsalted butter and sugar   Green beans Marjoram, lemon juice, nutmeg,dill seed   Tomatoes Basil, marjoram, onion   Spice /blend for [employer]" 4 tsp ground thyme 1 tsp ground sage 3 tsp ground rosemary 4 tsp ground marjoram   Test your knowledge 1. A product that says "Salt Free" may still contain sodium. True or False 2. Garlic Powder and Hot Pepper Sauce an be used as alternative seasonings.True or False 3. Processed foods have more sodium than fresh foods.  True or False 4. Canned Vegetables have less sodium than froze True or False  WAYS TO DECREASE YOUR SODIUM INTAKE 1. Avoid the use of added salt in cooking and at the table.  Table salt (and other prepared seasonings which contain salt) is probably one of the greatest sources of sodium in the diet.  Unsalted foods can gain flavor from the sweet, sour, and butter taste sensations of herbs and spices.  Instead of using salt  for seasoning, try the following seasonings with the foods listed.  Remember: how you use them to enhance natural food flavors is limited only by your creativity... Allspice-Meat, fish, eggs, fruit, peas, red and yellow vegetables Almond Extract-Fruit baked goods Anise Seed-Sweet breads, fruit, carrots, beets, cottage cheese, cookies (tastes like licorice) Basil-Meat, fish, eggs, vegetables, rice, vegetables salads, soups, sauces Bay Leaf-Meat, fish, stews, poultry Burnet-Salad, vegetables (cucumber-like flavor) Caraway Seed-Bread, cookies, cottage cheese, meat, vegetables, cheese,  rice Cardamon-Baked goods, fruit, soups Celery Powder or seed-Salads, salad dressings, sauces, meatloaf, soup, bread.Do not use  celery salt Chervil-Meats, salads, fish, eggs, vegetables, cottage cheese (parsley-like flavor) Chili Power-Meatloaf, chicken cheese, corn, eggplant, egg dishes Chives-Salads cottage cheese, egg dishes, soups, vegetables, sauces Cilantro-Salsa, casseroles Cinnamon-Baked goods, fruit, pork, lamb, chicken, carrots Cloves-Fruit, baked goods, fish, pot roast, green beans, beets, carrots Coriander-Pastry, cookies, meat, salads, cheese (lemon-orange flavor) Cumin-Meatloaf, fish,cheese, eggs, cabbage,fruit pie (caraway flavor) [employer], fruit, eggs, fish, poultry, cottage cheese, vegetables Dill Seed-Meat, cottage cheese, poultry, vegetables, fish, salads, bread Fennel Seed-Bread, cookies, apples, pork, eggs, fish, beets, cabbage, cheese, Licorice-like flavor Garlic-(buds or powder) Salads, meat, poultry, fish, bread, butter, vegetables, potatoes.Do not  use garlic salt Ginger-Fruit, vegetables, baked goods, meat, fish, poultry Horseradish Root-Meet, vegetables, butter Lemon Juice or Extract-Vegetables, fruit, tea, baked goods, fish salads Mace-Baked goods fruit, vegetables, fish, poultry (taste like nutmeg) Maple Extract-Syrups Marjoram-Meat, chicken, fish, vegetables, breads, green salads (taste like Sage) Mint-Tea, lamb, sherbet, vegetables, desserts, carrots, cabbage Mustard, Dry or Seed-Cheese, eggs, meats, vegetables, poultry Nutmeg-Baked goods, fruit, chicken, eggs, vegetables, desserts Onion Powder-Meat, fish, poultry, vegetables, cheese, eggs, bread, rice salads (Do not use   Onion salt) Orange Extract-Desserts, baked goods Oregano-Pasta, eggs, cheese, onions, pork, lamb, fish, chicken, vegetables, green salads Paprika-Meat, fish, poultry, eggs, cheese, vegetables Parsley Flakes-Butter, vegetables, meat fish, poultry, eggs, bread, salads (certain  forms may   Contain sodium Pepper-Meat fish, poultry, vegetables, eggs Peppermint Extract-Desserts, baked goods Poppy Seed-Eggs, bread, cheese, fruit dressings, baked goods, noodles, vegetables, cottage  [employer], poultry, meat, fish, cauliflower, turnips,eggs bread Saffron-Rice, bread, veal, chicken, fish, eggs Sage-Meat, fish, poultry, onions, eggplant, tomateos, pork, stews Savory-Eggs, salads, poultry, meat, rice, vegetables, soups, pork Tarragon-Meat, poultry, fish, eggs, butter, vegetables (licorice-like flavor)  Thyme-Meat, poultry, fish, eggs, vegetables, (clover-like flavor), sauces, soups Tumeric-Salads, butter, eggs, fish, rice, vegetables (saffron-like flavor) Vanilla Extract-Baked goods, candy Vinegar-Salads, vegetables, meat marinades Walnut Extract-baked goods, candy  2. Choose your Foods Wisely   The following is a list of foods to avoid which are high in sodium:  Meats-Avoid all smoked, canned, salt cured, dried and kosher meat and fish as well as Anchovies   [employer] meats:Bologna, Liverwurst, Pastrami Canned meat or fish  Marinated herring Caviar    Pepperoni Corned Beef   Pizza Dried chipped beef  Salami Frozen breaded fish or meat Salt pork Frankfurters or hot dogs  Sardines Gefilte fish   Sausage Ham (boiled ham, Proscuitto Smoked butt    spiced ham)   Spam      TV Dinners Vegetables Canned vegetables (Regular) Relish Canned mushrooms  Sauerkraut Olives    Tomato juice Pickles  Bakery and Dessert Products Canned puddings  Cream pies Cheesecake   Decorated cakes Cookies  Beverages/Juices Tomato juice, regular  Gatorade   V-8 vegetable juice, regular  Breads and Cereals Biscuit mixes   Salted potato chips, corn chips, pretzels Bread stuffing mixes  Salted crackers and rolls Pancake and waffle mixes Self-rising flour  Seasonings Accent  Meat sauces Barbecue sauce  Meat  tenderizer Catsup    Monosodium glutamate (MSG) Celery salt   Onion salt Chili sauce   Prepared mustard Garlic salt   Salt, seasoned salt, sea salt Gravy mixes   Soy sauce Horseradish   Steak sauce Ketchup   Tartar sauce Lite salt    Teriyaki sauce Marinade mixes   Worcestershire sauce  Others Baking powder   Cocoa and cocoa mixes Baking soda   Commercial casserole mixes Candy-caramels, chocolate  Dehydrated soups    Bars, fudge,nougats  Instant rice and pasta mixes Canned broth or soup  Maraschino cherries Cheese, aged and processed cheese and cheese spreads  Learning Assessment Quiz  Indicated T (for True) or F (for False) for each of the following statements:  1. _____ Fresh fruits and vegetables and unprocessed grains are generally low in sodium 2. _____ Water may contain a considerable amount of sodium, depending on the source 3. _____ You can always tell if a food is high in sodium by tasting it 4. _____ Certain laxatives my be high in sodium and should be avoided unless prescribed   by a physician or pharmacist 5. _____ Salt substitutes may be used freely by anyone on a sodium restricted diet 6. _____ Sodium is present in table salt, food additives and as a natural component of   most foods 7. _____ Table salt is approximately 90% sodium 8. _____ Limiting sodium intake may help prevent excess fluid accumulation in the body 9. _____ On a sodium-restricted diet, seasonings such as bouillon soy sauce, and    cooking wine should be used in place of table salt 10. _____ On an ingredient list, a product which lists monosodium glutamate as the first   ingredient is an appropriate food to include on a low sodium diet  Circle the best answer(s) to the following statements (Hint: there may be more than one correct answer)  11. On a low-sodium diet, some acceptable snack items are:    A. Olives  F. Bean dip   K. Grapefruit juice    B. Salted Pretzels G. Commercial Popcorn   L.  Canned peaches    C. Carrot Sticks  H. Bouillon   M. Unsalted nuts   D. [place] fries  I. Peanut butter crackers N. Salami   E. Sweet pickles J. Tomato Juice   O. Pizza  12.  Seasonings that may be used freely on a reduced - sodium diet include   A. Lemon wedges F.Monosodium glutamate K. Celery seed    B.Soysauce   G. Pepper   L. Mustard powder   C. Sea salt  H. Cooking wine  M. Onion flakes   D. Vinegar  E. Prepared horseradish N. Salsa   E. Sage   J. Worcestershire sauce  O. Chutney

## 2021-04-02 NOTE — Telephone Encounter (Signed)
resent

## 2021-04-04 ENCOUNTER — Other Ambulatory Visit: Payer: Self-pay | Admitting: Family Medicine

## 2021-04-05 ENCOUNTER — Ambulatory Visit (INDEPENDENT_AMBULATORY_CARE_PROVIDER_SITE_OTHER): Payer: 59 | Admitting: Psychology

## 2021-04-05 DIAGNOSIS — F4323 Adjustment disorder with mixed anxiety and depressed mood: Secondary | ICD-10-CM | POA: Diagnosis not present

## 2021-04-15 ENCOUNTER — Other Ambulatory Visit: Payer: Self-pay | Admitting: *Deleted

## 2021-04-15 MED ORDER — METHYLPHENIDATE HCL 10 MG PO TABS: 10.0000 mg | ORAL_TABLET | Freq: Four times a day (QID) | ORAL | 0 refills | Status: DC

## 2021-04-18 ENCOUNTER — Ambulatory Visit (INDEPENDENT_AMBULATORY_CARE_PROVIDER_SITE_OTHER): Payer: 59 | Admitting: Psychology

## 2021-04-18 DIAGNOSIS — F4323 Adjustment disorder with mixed anxiety and depressed mood: Secondary | ICD-10-CM | POA: Diagnosis not present

## 2021-04-23 ENCOUNTER — Other Ambulatory Visit: Payer: Self-pay

## 2021-04-23 ENCOUNTER — Ambulatory Visit (INDEPENDENT_AMBULATORY_CARE_PROVIDER_SITE_OTHER): Payer: Managed Care, Other (non HMO) | Admitting: Family Medicine

## 2021-04-23 VITALS — BP 130/82 | HR 62 | Temp 97.4°F | Resp 16 | Wt 309.2 lb

## 2021-04-23 DIAGNOSIS — R739 Hyperglycemia, unspecified: Secondary | ICD-10-CM

## 2021-04-23 DIAGNOSIS — M545 Low back pain, unspecified: Secondary | ICD-10-CM

## 2021-04-23 DIAGNOSIS — E349 Endocrine disorder, unspecified: Secondary | ICD-10-CM

## 2021-04-23 DIAGNOSIS — E039 Hypothyroidism, unspecified: Secondary | ICD-10-CM | POA: Diagnosis not present

## 2021-04-23 DIAGNOSIS — E785 Hyperlipidemia, unspecified: Secondary | ICD-10-CM

## 2021-04-23 DIAGNOSIS — I10 Essential (primary) hypertension: Secondary | ICD-10-CM

## 2021-04-23 DIAGNOSIS — R7989 Other specified abnormal findings of blood chemistry: Secondary | ICD-10-CM | POA: Diagnosis not present

## 2021-04-23 DIAGNOSIS — F418 Other specified anxiety disorders: Secondary | ICD-10-CM

## 2021-04-23 DIAGNOSIS — Z1159 Encounter for screening for other viral diseases: Secondary | ICD-10-CM

## 2021-04-23 NOTE — Assessment & Plan Note (Signed)
He continues with counselor, is doing well on current on his current meds.

## 2021-04-23 NOTE — Progress Notes (Signed)
8

## 2021-04-23 NOTE — Assessment & Plan Note (Signed)
He has had 2 falls in the past few weeks the last one was a couple of days ago and now he is experiencing spasm across upper lumbar spine. He is encouraged to try using his Flexeril scheduled for a week or so, 5 mg during the day and 10 mg qhs. Can follow up with PT and PMR

## 2021-04-23 NOTE — Progress Notes (Signed)
Patient ID: Keith Morrow, male    DOB: 30-Nov-1971  Age: 50 y.o. MRN: [id number]    Subjective:  Subjective  HPI Keith Morrow presents for office visit today for follow up on CAD and medication management. He reports that he experienced muscle spasms last night and lower back pain that shifts sides, but last night shifted to the right side of his back. He denies any chest pain, SOB, fever, abdominal pain, cough, chills, sore throat, dysuria, urinary incontinence, HA, or N/VD. He reports that in the last two weeks he has started taking ritalin 10 mg BID since he states that recently 10 mg a day was not enough for him to get work done. He reports that within the last two weeks he had experienced 2 falls, one when he was using the bathroom and the other was when he was trying to get something out of the [place]. He states that he has to remind himself to take a deep breath occasionally.   Review of Systems  Constitutional: Negative for chills, fatigue and fever.  HENT: Negative for congestion, rhinorrhea, sinus pressure, sinus pain and sore throat.   Eyes: Negative for pain.  Respiratory: Negative for cough and shortness of breath.   Cardiovascular: Negative for chest pain, palpitations and leg swelling.  Gastrointestinal: Negative for abdominal pain, blood in stool, diarrhea, nausea and vomiting.  Genitourinary: Negative for flank pain, frequency and penile pain.  Musculoskeletal: Positive for back pain (shifts from right to left).       (+) muscle spasms  Neurological: Negative for headaches.    History Past Medical History:  Diagnosis Date  . Acute bronchitis 05/09/2013  . Allergy   . Bronchitis, acute 12/10/2011  . Candidal skin infection 05/05/2017  . Chicken pox as a child  . Constipation 02/20/2017  . Coronary atherosclerosis of native coronary artery    a. NSTEMI 04/20/11: tx with Promus DES to LAD; bifurcation lesion with oDx 90% tx with POBA; b. staged PCI of right post. AV Branch with  Promus DES; c. residual at cath 04/21/11:  AV groove CFX 50%, mRCA 30%,; EF 55%  . Depression   . Depression with anxiety   . Dermatitis, contact 12/10/2011  . Fatigue 10/15/2012  . Heart attack (HCC) 04-20-11  . HTN (hypertension)   . Hyperlipidemia   . Hypotestosteronism 11/17/2011  . Hypothyroidism   . Keloid scar   . MS (multiple sclerosis) (HCC)   . Neuromuscular disorder (HCC)    NUMBNESS/TINGLING  . Obesity   . Onychomycosis   . Preventative health care 10/07/2011  . Reflux 10/07/2011  . Sleep apnea   . Sun-damaged skin 02/17/2016  . Testosterone deficiency 01/16/2012  . Tinea pedis   . 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 Visual changes 11/13/2016    He has a past surgical history that includes right wrist surgery; Vasectomy; Vascular surgery; Wisdom tooth extraction; Coronary angioplasty with stent; Pain pump implantation (N/A, 08/29/2014); Programable baclofen pump revision (08/29/14); and Pain pump implantation (Right, 09/20/2020).   His family history includes Coronary artery disease in an other family member; Heart attack (age of onset: 54) in his mother; Heart disease in his father, maternal grandfather, maternal grandmother, and mother; Hyperlipidemia in his maternal grandfather and maternal grandmother; Hypertension in his maternal grandfather and maternal grandmother; Stroke in his paternal grandfather; Thyroid disease in his brother, mother, sister, and sister.He reports that he has never smoked. He has never used smokeless tobacco. He reports current alcohol  use. He reports that he does not use drugs.  Current Outpatient Medications on File Prior to Visit  Medication Sig Dispense Refill  . aspirin EC 81 MG tablet Take 81 mg by mouth daily.    Marland Kitchen atorvastatin (LIPITOR) 40 MG tablet Take 1 tablet (40 mg total) by mouth daily at 6 PM. 90 tablet 3  . BACLOFEN IT 1 Dose by Intrathecal route as directed.     . busPIRone (BUSPAR) 10 MG tablet Take 1 tablet (10 mg total) by  mouth 3 (three) times daily. 270 tablet 1  . cetirizine (ZYRTEC) 10 MG tablet Take 10 mg by mouth daily as needed for allergies.    . clonazePAM (KLONOPIN) 1 MG tablet Take 1 tablet (1 mg total) by mouth 4 (four) times daily. 360 tablet 1  . Clonidine HCl POWD by Does not apply route. 720mg /ml IT Baclofen. Total dose 157.89mg /day. Last pump refill 06/02/16    . clotrimazole-betamethasone (LOTRISONE) cream Apply 1 application topically 2 (two) times daily as needed (anti-fungal). 30 g 2  . cyclobenzaprine (FLEXERIL) 10 MG tablet TAKE 1 TABLET BY MOUTH THREE TIMES A DAY AS NEEDED FOR MUSCLE SPASMS 90 tablet 3  . dalfampridine 10 MG TB12 TAKE 1 TABLET (10 MG TOTAL) EVERY 12 HOURS 180 tablet 3  . DULoxetine (CYMBALTA) 60 MG capsule Take 1 capsule (60 mg total) by mouth daily. Brand name only 180 capsule 3  . famotidine (PEPCID) 40 MG tablet TAKE 1 TABLET BY MOUTH EVERYDAY AT BEDTIME 90 tablet 3  . furosemide (LASIX) 20 MG tablet 1 ta po daily x 5 days and then as needed for SOB, weight gain>3#, pedal edema and can take a second tab daily as needed 45 tablet 3  . gabapentin (NEURONTIN) 600 MG tablet Take 1 tablet (600 mg total) by mouth 4 (four) times daily. 360 tablet 3  . ibuprofen (ADVIL,MOTRIN) 200 MG tablet Take 200 mg by mouth every 6 (six) hours as needed for moderate pain.    ketoconazole (NIZORAL) 2 % shampoo Apply topically.    06/04/16 levothyroxine (SYNTHROID) 150 MCG tablet Take 1 tablet (150 mcg total) by mouth daily before breakfast. 90 tablet 1  . methylphenidate (RITALIN) 10 MG tablet Take 1 tablet (10 mg total) by mouth 4 (four) times daily. 120 tablet 0  . natalizumab (TYSABRI) 300 MG/15ML injection Inject 300 mg into the vein every 28 (twenty-eight) days.     . nitroGLYCERIN (NITROSTAT) 0.4 MG SL tablet PLACE 1 TABLET UNDER THE TOUNGE EVERY 5 MINUTES AS NEEDED FOR CHEST PAIN (X 3 DOSES) 25 tablet 0  . nystatin cream (MYCOSTATIN) Apply 1 application topically as directed. 30 g 2  .  olmesartan-hydrochlorothiazide (BENICAR HCT) 20-12.5 MG tablet TAKE 1 TABLET BY MOUTH EVERY DAY 90 tablet 1  . omeprazole (PRILOSEC) 40 MG capsule Take 1 capsule (40 mg total) by mouth daily. 90 capsule 3  . potassium chloride SA (KLOR-CON M20) 20 MEQ tablet TAKE 1 TABLET BY MOUTH EVERY DAY AS NEEDED WHEN TAKING A SECOND LASIX TAB ANY GIVEN DAY 90 tablet 3  . sucralfate (CARAFATE) 1 g tablet TAKE 1 TABLET BY MOUTH 4 TIMES DAILY - WITH MEALS AND AT BEDTIME. TAKE TABLET OR LIQUID BUT NOT BOTH 120 tablet 1  . sucralfate (CARAFATE) 1 GM/10ML suspension Take 10 mLs (1 g total) by mouth 4 (four) times daily -  with meals and at bedtime. Take liquid or tablets but not both 420 mL 3  . tamsulosin (FLOMAX) 0.4 MG  CAPS capsule Take 1 capsule (0.4 mg total) by mouth daily. 90 capsule 3   No current facility-administered medications on file prior to visit.     Objective:  Objective  Physical Exam Constitutional:      General: He is not in acute distress.    Appearance: Normal appearance. He is not ill-appearing or toxic-appearing.  HENT:     Head: Normocephalic and atraumatic.     Right Ear: Tympanic membrane, ear canal and external ear normal.     Left Ear: Tympanic membrane, ear canal and external ear normal.     Nose: No congestion or rhinorrhea.  Eyes:     Extraocular Movements: Extraocular movements intact.     Pupils: Pupils are equal, round, and reactive to light.  Cardiovascular:     Rate and Rhythm: Normal rate and regular rhythm.     Pulses: Normal pulses.     Heart sounds: Normal heart sounds. No murmur heard.   Pulmonary:     Effort: Pulmonary effort is normal. No respiratory distress.     Breath sounds: Normal breath sounds. No wheezing, rhonchi or rales.  Abdominal:     General: Bowel sounds are normal.     Palpations: Abdomen is soft. There is no mass.     Tenderness: There is no abdominal tenderness. There is no guarding.     Hernia: No hernia is present.  Musculoskeletal:         General: Normal range of motion.     Cervical back: Normal range of motion and neck supple.  Skin:    General: Skin is warm and dry.  Neurological:     Mental Status: He is alert and oriented to person, place, and time.  Psychiatric:        Behavior: Behavior normal.    BP 130/82   Pulse 62   Temp (!) 97.4 F (36.3 C)   Resp 16   Wt (!) 309 lb 3.2 oz (140.3 kg)   SpO2 95%   BMI 38.65 kg/m  Wt Readings from Last 3 Encounters:  04/23/21 (!) 309 lb 3.2 oz (140.3 kg)  04/02/21 (!) 322 lb 6.4 oz (146.2 kg)  10/08/20 299 lb 6.4 oz (135.8 kg)     Lab Results  Component Value Date   WBC 7.3 04/02/2021   HGB 13.4 04/02/2021   HCT 40.4 04/02/2021   PLT 181 04/02/2021   GLUCOSE 111 (H) 04/02/2021   CHOL 159 04/02/2021   TRIG 60 04/02/2021   HDL 65 04/02/2021   LDLCALC 82 04/02/2021   ALT 26 04/02/2021   AST 20 04/02/2021   NA 138 04/02/2021   K 4.4 04/02/2021   CL 99 04/02/2021   CREATININE 0.90 04/02/2021   BUN 14 04/02/2021   CO2 28 04/02/2021   TSH 1.510 04/02/2021   PSA 0.52 02/11/2016   INR 1.00 04/21/2011   HGBA1C 5.6 07/03/2020    No results found.   Assessment & Plan:  Plan    No orders of the defined types were placed in this encounter.   Problem List Items Addressed This Visit    Hyperlipidemia    Tolerating statin, encouraged heart healthy diet, avoid trans fats, minimize simple carbs and saturated fats. Increase exercise as tolerated      Relevant Orders   Lipid panel   Hypothyroidism    On Levothyroxine, continue to monitor      Relevant Orders   TSH   HTN (hypertension)    Well controlled, no  changes to meds. Encouraged heart healthy diet such as the DASH diet and exercise as tolerated.       Relevant Orders   CBC   Comprehensive metabolic panel   Depression with anxiety    He continues with counselor, is doing well on current on his current meds.       Hypotestosteronism    Continue to monitor      RESOLVED: Low  testosterone - Primary   Relevant Orders   Testosterone   Hyperglycemia    hgba1c acceptable, minimize simple carbs. Increase exercise as tolerated.       Relevant Orders   Hemoglobin A1c   Low back pain    He has had 2 falls in the past few weeks the last one was a couple of days ago and now he is experiencing spasm across upper lumbar spine. He is encouraged to try using his Flexeril scheduled for a week or so, 5 mg during the day and 10 mg qhs. Can follow up with PT and PMR       Other Visit Diagnoses    Encounter for hepatitis C screening test for low risk patient       Relevant Orders   Hepatitis C Antibody      Follow-up: Return in about 4 months (around 09/02/2021), or lab appt late september with f/u a week or so later.   I,Sheikh Hanna,acting as a scribe for Danise EdgeStacey Stanislav Gervase, MD.,have documented all relevant documentation on the behalf of Danise EdgeStacey Maelle Sheaffer, MD,as directed by  Danise EdgeStacey Perlie Stene, MD while in the presence of Danise EdgeStacey Violia Knopf, MD.  I, Bradd CanaryStacey A Melyssa Signor, MD personally performed the services described in this documentation. All medical record entries made by the scribe were at my direction and in my presence. I have reviewed the chart and agree that the record reflects my personal performance and is accurate and complete

## 2021-04-23 NOTE — Assessment & Plan Note (Signed)
hgba1c acceptable, minimize simple carbs. Increase exercise as tolerated.  

## 2021-04-23 NOTE — Assessment & Plan Note (Signed)
On Levothyroxine, continue to monitor 

## 2021-04-23 NOTE — Assessment & Plan Note (Signed)
Well controlled, no changes to meds. Encouraged heart healthy diet such as the DASH diet and exercise as tolerated.  °

## 2021-04-23 NOTE — Assessment & Plan Note (Signed)
Continue to monitor

## 2021-04-23 NOTE — Assessment & Plan Note (Signed)
Tolerating statin, encouraged heart healthy diet, avoid trans fats, minimize simple carbs and saturated fats. Increase exercise as tolerated 

## 2021-04-23 NOTE — Patient Instructions (Signed)
[employer] flash cards for studying for premed,  online, APP [email or web address]">  Chronic Back Pain When back pain lasts longer than 3 months, it is called chronic back pain. The cause of your back pain may not be known. Some common causes include:  Wear and tear (degenerative disease) of the bones, ligaments, or disks in your back.  Inflammation and stiffness in your back (arthritis). People who have chronic back pain often go through certain periods in which the pain is more intense (flare-ups). Many people can learn to manage the pain with home care. Follow these instructions at home: Pay attention to any changes in your symptoms. Take these actions to help with your pain: Managing pain and stiffness  If directed, apply ice to the painful area. Your health care provider may recommend applying ice during the first 24-48 hours after a flare-up begins. To do this: ? Put ice in a plastic bag. ? Place a towel between your skin and the bag. ? Leave the ice on for 20 minutes, 2-3 times per day.  If directed, apply heat to the affected area as often as told by your health care provider. Use the heat source that your health care provider recommends, such as a moist heat pack or a heating pad. ? Place a towel between your skin and the heat source. ? Leave the heat on for 20-30 minutes. ? Remove the heat if your skin turns bright red. This is especially important if you are unable to feel pain, heat, or cold. You may have a greater risk of getting burned.  Try soaking in a warm tub.      Activity  Avoid bending and other activities that make the problem worse.  Maintain a proper position when standing or sitting: ? When standing, keep your upper back and neck straight, with your shoulders pulled back. Avoid slouching. ? When sitting, keep your back straight and relax your shoulders. Do not round your shoulders or pull them backward.  Do not sit or stand in one place for long  periods of time.  Take brief periods of rest throughout the day. This will reduce your pain. Resting in a lying or standing position is usually better than sitting to rest.  When you are resting for longer periods, mix in some mild activity or stretching between periods of rest. This will help to prevent stiffness and pain.  Get regular exercise. Ask your health care provider what activities are safe for you.  Do not lift anything that is heavier than 10 lb (4.5 kg), or the limit that you are told, until your health care provider says that it is safe. Always use proper lifting technique, which includes: ? Bending your knees. ? Keeping the load close to your body. ? Avoiding twisting.  Sleep on a firm mattress in a comfortable position. Try lying on your side with your knees slightly bent. If you lie on your back, put a pillow under your knees.   Medicines  Treatment may include medicines for pain and inflammation taken by mouth or applied to the skin, prescription pain medicine, or muscle relaxants. Take over-the-counter and prescription medicines only as told by your health care provider.  Ask your health care provider if the medicine prescribed to you: ? Requires you to avoid driving or using machinery. ? Can cause constipation. You may need to take these actions to prevent or treat constipation:  Drink enough fluid to keep your urine pale yellow.  Take over-the-counter or prescription medicines.  Eat foods that are high in fiber, such as beans, whole grains, and fresh fruits and vegetables.  Limit foods that are high in fat and processed sugars, such as fried or sweet foods. General instructions  Do not use any products that contain nicotine or tobacco, such as cigarettes, e-cigarettes, and chewing tobacco. If you need help quitting, ask your health care provider.  Keep all follow-up visits as told by your health care provider. This is important. Contact a health care provider  if:  You have pain that is not relieved with rest or medicine.  Your pain gets worse, or you have new pain.  You have a high fever.  You have rapid weight loss.  You have trouble doing your normal activities. Get help right away if:  You have weakness or numbness in one or both of your legs or feet.  You have trouble controlling your bladder or your bowels.  You have severe back pain and have any of the following: ? Nausea or vomiting. ? Pain in your abdomen. ? Shortness of breath or you faint. Summary  Chronic back pain is back pain that lasts longer than 3 months.  When a flare-up begins, apply ice to the painful area for the first 24-48 hours.  Apply a moist heat pad or use a heating pad on the painful area as directed by your health care provider.  When you are resting for longer periods, mix in some mild activity or stretching between periods of rest. This will help to prevent stiffness and pain. This information is not intended to replace advice given to you by your health care provider. Make sure you discuss any questions you have with your health care provider. Document Revised: 01/04/2020 Document Reviewed: 01/04/2020 Elsevier Patient Education  2021 [employer].

## 2021-04-25 ENCOUNTER — Other Ambulatory Visit: Payer: Self-pay | Admitting: Cardiovascular Disease

## 2021-05-02 ENCOUNTER — Ambulatory Visit (INDEPENDENT_AMBULATORY_CARE_PROVIDER_SITE_OTHER): Payer: 59 | Admitting: Psychology

## 2021-05-02 DIAGNOSIS — F4323 Adjustment disorder with mixed anxiety and depressed mood: Secondary | ICD-10-CM

## 2021-05-16 ENCOUNTER — Encounter: Payer: Self-pay | Admitting: Family Medicine

## 2021-05-16 ENCOUNTER — Ambulatory Visit (INDEPENDENT_AMBULATORY_CARE_PROVIDER_SITE_OTHER): Payer: 59 | Admitting: Psychology

## 2021-05-16 DIAGNOSIS — F4323 Adjustment disorder with mixed anxiety and depressed mood: Secondary | ICD-10-CM

## 2021-05-24 ENCOUNTER — Telehealth (INDEPENDENT_AMBULATORY_CARE_PROVIDER_SITE_OTHER): Payer: Managed Care, Other (non HMO) | Admitting: Family Medicine

## 2021-05-24 ENCOUNTER — Telehealth: Payer: Self-pay | Admitting: Family Medicine

## 2021-05-24 DIAGNOSIS — E6609 Other obesity due to excess calories: Secondary | ICD-10-CM | POA: Diagnosis not present

## 2021-05-24 DIAGNOSIS — U071 COVID-19: Secondary | ICD-10-CM | POA: Insufficient documentation

## 2021-05-24 DIAGNOSIS — I10 Essential (primary) hypertension: Secondary | ICD-10-CM

## 2021-05-24 DIAGNOSIS — G35 Multiple sclerosis: Secondary | ICD-10-CM

## 2021-05-24 MED ORDER — NIRMATRELVIR/RITONAVIR (PAXLOVID)TABLET: ORAL_TABLET | ORAL | 0 refills | Status: DC

## 2021-05-24 NOTE — Assessment & Plan Note (Signed)
His symptoms began yesterday evening and he tested positive for COVID today at 2 pm. He is noting headaches responding to Advil. Increased contractures of his MS. Oxygen levels ranging from 86 to 94%. He is noting congestion and increased sense of needing to take a deep breath.  Pulse oximeter, want oxygen in 90s, seek care if drops and does not respond to dep breathing and rest. Watch BP and pulse and seek care if pulse stays elevated above 120 for an extended period of time and blood pressure changes dramatically  Take Multivitamin with minerals, selenium Vitamin D 1000-2000 IU daily Probiotic with lactobacillus and bifidophilus Asprin EC 81 mg daily Fish oil caps daily Melatonin 2-5 mg at bedtime

## 2021-05-24 NOTE — Progress Notes (Signed)
MyChart Video Visit    Virtual Visit via Video Note   This visit type was conducted due to national recommendations for restrictions regarding the COVID-19 Pandemic (e.g. social distancing) in an effort to limit this patient's exposure and mitigate transmission in our community. This patient is at least at moderate risk for complications without adequate follow up. This format is felt to be most appropriate for this patient at this time. Physical exam was limited by quality of the video and audio technology used for the visit.Robb Matar, RN was able to get the patient set up on a video visit.  Patient location: home Patient, wife and provider in visit Provider location: Office  I discussed the limitations of evaluation and management by telemedicine and the availability of in person appointments. The patient expressed understanding and agreed to proceed.  Visit Date: 05/24/2021  Today's healthcare provider: Penni Homans, MD     Subjective:    Patient ID: Keith Morrow, male    DOB: [birth year], 50 y.o.   MRN: [id number]  Chief Complaint  Patient presents with   Covid Positive    Home test today, positive. Chest congestion, sniffles, fever, cough started 05/23/2021    HPI Patient is in today for evaluation of new onset COVID. His symptoms began yesterday evening and he tested positive for COVID today at 2 pm. He is noting headaches responding to Advil. Increased contractures of his MS. Oxygen levels ranging from 86 to 94%. He is noting congestion and increased sense of needing to take a deep breath. Denies CP/palp/fevers/GI or GU c/o. Taking meds as prescribed   Past Medical History:  Diagnosis Date   Acute bronchitis 05/09/2013   Allergy    Bronchitis, acute 12/10/2011   Candidal skin infection 05/05/2017   Chicken pox as a child   Constipation 02/20/2017   Coronary atherosclerosis of native coronary artery    a. NSTEMI 04/20/11: tx with Promus DES to LAD; bifurcation lesion with  oDx 90% tx with POBA; b. staged PCI of right post. AV Branch with Promus DES; c. 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   Visual changes 11/13/2016    Past Surgical History:  Procedure Laterality Date   CORONARY ANGIOPLASTY WITH STENT PLACEMENT     2012   PAIN PUMP IMPLANTATION N/A 08/29/2014   Procedure: Baclofen pump placement;  Surgeon: Erline Levine, MD;  Location: [facility];  Service: Neurosurgery;  Laterality: N/A;  Baclofen pump placement   PAIN PUMP IMPLANTATION Right 09/20/2020   Procedure: Right Baclofen pump replacement;  Surgeon: Erline Levine, MD;  Location: [facility];  Service: Neurosurgery;  Laterality: Right;  Right Baclofen pump replacement   PROGRAMABLE BACLOFEN PUMP REVISION  08/29/14   right wrist surgery     CTR   VASCULAR SURGERY     vericose  vein in right femoral area   VASECTOMY     WISDOM TOOTH EXTRACTION      Family History  Problem Relation Age of Onset   Heart attack Mother 29   Thyroid disease Mother    Heart disease Mother    Thyroid disease Sister    Thyroid disease Brother    Heart disease Maternal Grandmother    Hypertension Maternal Grandmother    Hyperlipidemia Maternal Grandmother    Heart disease Maternal Grandfather    Hypertension Maternal Grandfather    Hyperlipidemia Maternal Grandfather    Stroke Paternal Grandfather    Thyroid disease Sister    Heart disease Father         2 stents   Coronary artery disease Other        questionable in father    Social History   Socioeconomic History   Marital status: Married    Spouse name: Not on file   Number of children: 3   Years of education: Not on file   Highest education level: Not on file  Occupational History   Not on file  Tobacco Use   Smoking status: Never   Smokeless tobacco: Never  Vaping Use   Vaping Use: Never used  Substance and Sexual Activity   Alcohol use: Yes    Comment: socially   Drug use: No   Sexual activity: Yes    Partners: Female    Comment: 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, works Transport planner, no dietary restrictions  Other Topics Concern   Not on file  Social History Narrative   Not on file   Social Determinants of Health   Financial Resource Strain: Not on file  Food Insecurity: Not on file  Transportation Needs: Not on file  Physical Activity: Not on file  Stress: Not on file  Social Connections: Not on file  Intimate Partner Violence: Not on file    Outpatient Medications Prior to Visit  Medication Sig Dispense Refill   aspirin EC 81 MG tablet Take 81 mg by mouth daily.     atorvastatin (LIPITOR) 40 MG tablet TAKE 1 TABLET (40 MG TOTAL) BY MOUTH DAILY AT 6 PM. 90 tablet 3   BACLOFEN IT 1 Dose by Intrathecal route as directed.      busPIRone (BUSPAR) 10 MG tablet Take 1 tablet (10 mg total) by mouth 3 (three) times daily. 270 tablet 1   cetirizine (ZYRTEC) 10 MG tablet Take 10 mg by mouth daily as needed for allergies.     clonazePAM (KLONOPIN) 1 MG tablet Take 1 tablet (1 mg total) by mouth 4 (four) times daily. 360 tablet 1   Clonidine HCl POWD by Does not apply route. 765m/ml IT Baclofen. Total dose 157.861mday. Last pump refill 06/02/16     clotrimazole-betamethasone (LOTRISONE) cream Apply 1 application topically 2 (two) times daily as needed (anti-fungal). 30 g 2   cyclobenzaprine (FLEXERIL) 10 MG tablet TAKE 1 TABLET BY MOUTH THREE TIMES A DAY AS NEEDED  FOR MUSCLE SPASMS 90 tablet 3   dalfampridine 10 MG TB12 TAKE 1 TABLET (10 MG TOTAL) EVERY 12 HOURS 180 tablet 3   DULoxetine (CYMBALTA) 60 MG capsule Take 1 capsule (60 mg total) by mouth daily. Brand name only 180 capsule 3   famotidine (PEPCID) 40 MG tablet TAKE 1 TABLET BY MOUTH EVERYDAY AT BEDTIME 90 tablet 3   furosemide (LASIX) 20 MG tablet 1 ta po daily x 5 days and then as needed for SOB, weight gain>3#, pedal  edema and can take a second tab daily as needed 45 tablet 3   gabapentin (NEURONTIN) 600 MG tablet Take 1 tablet (600 mg total) by mouth 4 (four) times daily. 360 tablet 3   ibuprofen (ADVIL,MOTRIN) 200 MG tablet Take 200 mg by mouth every 6 (six) hours as needed for moderate pain.     ketoconazole (NIZORAL) 2 % shampoo Apply topically.     levothyroxine (SYNTHROID) 150 MCG tablet Take 1 tablet (150 mcg total) by mouth daily before breakfast. 90 tablet 1   methylphenidate (RITALIN) 10 MG tablet Take 1 tablet (10 mg total) by mouth 4 (four) times daily. 120 tablet 0   natalizumab (TYSABRI) 300 MG/15ML injection Inject 300 mg into the vein every 28 (twenty-eight) days.      nitroGLYCERIN (NITROSTAT) 0.4 MG SL tablet PLACE 1 TABLET UNDER THE TOUNGE EVERY 5 MINUTES AS NEEDED FOR CHEST PAIN (X 3 DOSES) 25 tablet 0   nystatin cream (MYCOSTATIN) Apply 1 application topically as directed. 30 g 2   olmesartan-hydrochlorothiazide (BENICAR HCT) 20-12.5 MG tablet TAKE 1 TABLET BY MOUTH EVERY DAY 90 tablet 1   omeprazole (PRILOSEC) 40 MG capsule Take 1 capsule (40 mg total) by mouth daily. 90 capsule 3   potassium chloride SA (KLOR-CON M20) 20 MEQ tablet TAKE 1 TABLET BY MOUTH EVERY DAY AS NEEDED WHEN TAKING A SECOND LASIX TAB ANY GIVEN DAY 90 tablet 3   sucralfate (CARAFATE) 1 g tablet TAKE 1 TABLET BY MOUTH 4 TIMES DAILY - WITH MEALS AND AT BEDTIME. TAKE TABLET OR LIQUID BUT NOT BOTH 120 tablet 1   sucralfate (CARAFATE) 1 GM/10ML suspension Take 10 mLs (1 g total) by mouth 4 (four) times daily -   with meals and at bedtime. Take liquid or tablets but not both 420 mL 3   tamsulosin (FLOMAX) 0.4 MG CAPS capsule Take 1 capsule (0.4 mg total) by mouth daily. 90 capsule 3   No facility-administered medications prior to visit.    No Known Allergies  Review of Systems  Constitutional:  Positive for malaise/fatigue. Negative for fever.  HENT:  Negative for congestion.   Eyes:  Negative for blurred vision.  Respiratory:  Positive for cough, sputum production and shortness of breath.   Cardiovascular:  Negative for chest pain, palpitations and leg swelling.  Gastrointestinal:  Negative for abdominal pain, blood in stool and nausea.  Genitourinary:  Negative for dysuria and frequency.  Musculoskeletal:  Positive for myalgias. Negative for falls.  Skin:  Negative for rash.  Neurological:  Negative for dizziness, loss of consciousness and headaches.  Endo/Heme/Allergies:  Negative for environmental allergies.  Psychiatric/Behavioral:  Negative for depression. The patient is not nervous/anxious.       Objective:    Physical Exam Constitutional:      General: He is not in acute distress.    Appearance: Normal appearance. He is not ill-appearing or toxic-appearing.  HENT:     Head: Normocephalic and atraumatic.     Right Ear: External ear normal.     Left Ear: External ear normal.     Nose: Nose normal.  Eyes:     General:        Right eye: No discharge.        Left eye: No discharge.  Pulmonary:     Effort: Pulmonary effort is normal.  Skin:    Findings: No rash.  Neurological:     Mental Status: He is alert and oriented to person, place, and time.  Psychiatric:  Behavior: Behavior normal.    Pulse (!) 110   Wt (!) 310 lb (140.6 kg)   SpO2 93%   BMI 38.75 kg/m  Wt Readings from Last 3 Encounters:  05/24/21 (!) 310 lb (140.6 kg)  04/23/21 (!) 309 lb 3.2 oz (140.3 kg)  04/02/21 (!) 322 lb 6.4 oz (146.2 kg)    Diabetic Foot Exam - Simple   No data filed     Lab Results  Component Value Date   WBC 7.3 04/02/2021   HGB 13.4 04/02/2021   HCT 40.4 04/02/2021   PLT 181 04/02/2021   GLUCOSE 111 (H) 04/02/2021   CHOL 159 04/02/2021   TRIG 60 04/02/2021   HDL 65 04/02/2021   LDLCALC 82 04/02/2021   ALT 26 04/02/2021   AST 20 04/02/2021   NA 138 04/02/2021   K 4.4 04/02/2021   CL 99 04/02/2021   CREATININE 0.90 04/02/2021   BUN 14 04/02/2021   CO2 28 04/02/2021   TSH 1.510 04/02/2021   PSA 0.52 02/11/2016   INR 1.00 04/21/2011   HGBA1C 5.6 07/03/2020    Lab Results  Component Value Date   TSH 1.510 04/02/2021   Lab Results  Component Value Date   WBC 7.3 04/02/2021   HGB 13.4 04/02/2021   HCT 40.4 04/02/2021   MCV 90 04/02/2021   PLT 181 04/02/2021   Lab Results  Component Value Date   NA 138 04/02/2021   K 4.4 04/02/2021   CO2 28 04/02/2021   GLUCOSE 111 (H) 04/02/2021   BUN 14 04/02/2021   CREATININE 0.90 04/02/2021   BILITOT 0.5 04/02/2021   ALKPHOS 90 04/02/2021   AST 20 04/02/2021   ALT 26 04/02/2021   PROT 6.4 04/02/2021   ALBUMIN 4.4 04/02/2021   CALCIUM 9.3 04/02/2021   ANIONGAP 10 09/18/2020   EGFR 105 04/02/2021   GFR 91.48 07/03/2020   Lab Results  Component Value Date   CHOL 159 04/02/2021   Lab Results  Component Value Date   HDL 65 04/02/2021   Lab Results  Component Value Date   LDLCALC 82 04/02/2021   Lab Results  Component Value Date   TRIG 60 04/02/2021   Lab Results  Component Value Date   CHOLHDL 2.4 04/02/2021   Lab Results  Component Value Date   HGBA1C 5.6 07/03/2020       Assessment & Plan:   Problem List Items Addressed This Visit     Multiple sclerosis ([place])    He notes a flare in his MS with COVID onset. More muscle spasms and contractures. Encouraged to hydrate better and consider gatorade or powerade.       HTN (hypertension)    Monitor and report ny concerns, noted risk factor       Obesity    Significant risk for severe covid       COVID-19     His symptoms began yesterday evening and he tested positive for COVID today at 2 pm. He is noting headaches responding to Advil. Increased contractures of his MS. Oxygen levels ranging from 86 to 94%. He is noting congestion and increased sense of needing to take a deep breath.  Pulse oximeter, want oxygen in 90s, seek care if drops and does not respond to dep breathing and rest. Watch BP and pulse and seek care if pulse stays elevated above 120 for an extended period of time and blood pressure changes dramatically  Take Multivitamin with minerals, selenium Vitamin D 1000-2000 IU daily  Probiotic with lactobacillus and bifidophilus Asprin EC 81 mg daily Fish oil caps daily Melatonin 2-5 mg at bedtime         Relevant Medications   nirmatrelvir/ritonavir EUA (PAXLOVID) TABS    I am having Youlanda Roys. Wissink start on nirmatrelvir/ritonavir EUA. I am also having him maintain his cetirizine, aspirin EC, ibuprofen, BACLOFEN IT, natalizumab, Clonidine HCl, clotrimazole-betamethasone, nitroGLYCERIN, sucralfate, sucralfate, nystatin cream, cyclobenzaprine, ketoconazole, dalfampridine, levothyroxine, gabapentin, clonazePAM, omeprazole, busPIRone, famotidine, tamsulosin, furosemide, potassium chloride SA, DULoxetine, olmesartan-hydrochlorothiazide, methylphenidate, and atorvastatin.  Meds ordered this encounter  Medications   nirmatrelvir/ritonavir EUA (PAXLOVID) TABS    Sig: (Take nirmatrelvir 150 mg two tablets twice daily for 5 days and ritonavir 100 mg one tablet twice daily for 5 days) Patient GFR is >60    Dispense:  30 tablet    Refill:  0    I discussed the assessment and treatment plan with the patient. The patient was provided an opportunity to ask questions and all were answered. The patient agreed with the plan and demonstrated an understanding of the instructions.   The patient was advised to call back or seek an in-person evaluation if the symptoms worsen or if the condition fails to  improve as anticipated.  I provided 25 minutes of face-to-face time during this encounter.   Penni Homans, MD [facility] [phone] (phone) [phone] (fax)  [place]

## 2021-05-24 NOTE — Assessment & Plan Note (Signed)
Monitor and report ny concerns, noted risk factor

## 2021-05-24 NOTE — Telephone Encounter (Signed)
Patient scheduled virtual visit today with PCP.

## 2021-05-24 NOTE — Assessment & Plan Note (Signed)
Significant risk for severe covid

## 2021-05-24 NOTE — Assessment & Plan Note (Signed)
He notes a flare in his MS with COVID onset. More muscle spasms and contractures. Encouraged to hydrate better and consider gatorade or powerade.

## 2021-05-24 NOTE — Telephone Encounter (Signed)
Called pt lvm to call back.

## 2021-05-24 NOTE — Telephone Encounter (Signed)
Patient tested Positive for Covid . He states His MS was Triggered and having problem with Mobility,  Heart Rate has been 120-130 at rest Nasal drainage and congestion. He need to know at what rate should he be concerned and need to see medical attention.  NO [facility] office had appt at the time he called.  Please forward to any provider that may be able to provide patient with advise today. Thanks

## 2021-05-30 ENCOUNTER — Ambulatory Visit (INDEPENDENT_AMBULATORY_CARE_PROVIDER_SITE_OTHER): Payer: 59 | Admitting: Psychology

## 2021-05-30 DIAGNOSIS — F4323 Adjustment disorder with mixed anxiety and depressed mood: Secondary | ICD-10-CM | POA: Diagnosis not present

## 2021-06-27 ENCOUNTER — Ambulatory Visit (INDEPENDENT_AMBULATORY_CARE_PROVIDER_SITE_OTHER): Payer: 59 | Admitting: Psychology

## 2021-06-27 DIAGNOSIS — F4323 Adjustment disorder with mixed anxiety and depressed mood: Secondary | ICD-10-CM

## 2021-06-29 ENCOUNTER — Other Ambulatory Visit: Payer: Self-pay

## 2021-07-01 MED ORDER — METHYLPHENIDATE HCL 10 MG PO TABS: 10.0000 mg | ORAL_TABLET | Freq: Four times a day (QID) | ORAL | 0 refills | Status: DC

## 2021-07-11 ENCOUNTER — Ambulatory Visit: Payer: 59 | Admitting: Psychology

## 2021-07-25 ENCOUNTER — Ambulatory Visit (INDEPENDENT_AMBULATORY_CARE_PROVIDER_SITE_OTHER): Payer: 59 | Admitting: Psychology

## 2021-07-25 DIAGNOSIS — F4323 Adjustment disorder with mixed anxiety and depressed mood: Secondary | ICD-10-CM | POA: Diagnosis not present

## 2021-08-08 ENCOUNTER — Ambulatory Visit (INDEPENDENT_AMBULATORY_CARE_PROVIDER_SITE_OTHER): Payer: 59 | Admitting: Psychology

## 2021-08-08 DIAGNOSIS — F4323 Adjustment disorder with mixed anxiety and depressed mood: Secondary | ICD-10-CM | POA: Diagnosis not present

## 2021-08-11 ENCOUNTER — Encounter: Payer: Self-pay | Admitting: Family Medicine

## 2021-08-11 DIAGNOSIS — L600 Ingrowing nail: Secondary | ICD-10-CM

## 2021-08-16 ENCOUNTER — Encounter: Payer: Self-pay | Admitting: Podiatry

## 2021-08-16 ENCOUNTER — Ambulatory Visit (INDEPENDENT_AMBULATORY_CARE_PROVIDER_SITE_OTHER): Payer: Managed Care, Other (non HMO) | Admitting: Podiatry

## 2021-08-16 ENCOUNTER — Other Ambulatory Visit: Payer: Self-pay

## 2021-08-16 DIAGNOSIS — L600 Ingrowing nail: Secondary | ICD-10-CM | POA: Diagnosis not present

## 2021-08-16 NOTE — Patient Instructions (Signed)

## 2021-08-16 NOTE — Progress Notes (Signed)
Subjective:   Patient ID: Keith Morrow, male   DOB: 50 y.o.   MRN: [id number]   HPI Patient presents with a chronic ingrown toenail of the left big toe that is been sore and making it hard to wear shoe gear.  He cannot trim it due to his MS and he would like to get it corrected if possible as it makes it hard for him to sleep as she eats bother it.   ROS      Objective:  Physical Exam  Neurovascular status intact with patient having mild swelling of his feet and legs secondary to using wheelchair but has good arterial flow with good digital perfusion well oriented.  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  Patient wants surgery understanding risk and signed consent form and today I infiltrated the left hallux 60 mg like Marcaine mixture sterile prep done and using sterile instrumentation removed the medial border exposed matrix applied phenol 3 applications 30 seconds followed by alcohol lavage sterile dressing and gave instructions on soaks.  Encouraged to leave dressing on 24 hours but take it off earlier with any pain and encouraged to call with questions

## 2021-08-20 ENCOUNTER — Telehealth: Payer: Self-pay | Admitting: Neurology

## 2021-08-20 ENCOUNTER — Ambulatory Visit (INDEPENDENT_AMBULATORY_CARE_PROVIDER_SITE_OTHER): Payer: Managed Care, Other (non HMO) | Admitting: Neurology

## 2021-08-20 ENCOUNTER — Encounter: Payer: Self-pay | Admitting: Neurology

## 2021-08-20 VITALS — BP 116/69 | HR 87 | Ht 75.0 in | Wt 296.5 lb

## 2021-08-20 DIAGNOSIS — Z95828 Presence of other vascular implants and grafts: Secondary | ICD-10-CM | POA: Diagnosis not present

## 2021-08-20 DIAGNOSIS — G35D Multiple sclerosis, unspecified: Secondary | ICD-10-CM

## 2021-08-20 DIAGNOSIS — G801 Spastic diplegic cerebral palsy: Secondary | ICD-10-CM | POA: Diagnosis not present

## 2021-08-20 DIAGNOSIS — Z79899 Other long term (current) drug therapy: Secondary | ICD-10-CM | POA: Diagnosis not present

## 2021-08-20 DIAGNOSIS — R3915 Urgency of urination: Secondary | ICD-10-CM

## 2021-08-20 DIAGNOSIS — G35 Multiple sclerosis: Secondary | ICD-10-CM

## 2021-08-20 NOTE — Progress Notes (Signed)
HPI     Follow-up    Additional comments: Rm 1, alone. Here for MS f/u, on Tysabri. Pt reports being stable, pt reports being slightly weaker.       Last edited by Melanee Spry, CMA on 08/20/2021  3:13 PM.       Chief Complaint   Follow-up     HISTORY:  Keith Morrow is a 50 y.o. man with a relapsing form of multiple sclerosis who has right greater than left leg weakness and progressive difficulties with gait function.  Due to severe spasticity, a baclofen pump was placed in 2013 and replaced 09/2020.  Update 02/12/2021: He is on Tysabri and tolerates it well.   No exacerbations.  He has a baclofen pump.  He is now using [facility] to fill the pump.    He notes more trouble transferring out of the wheelchair or using a toilet.  He no longer use his wlker. He tries to exercise some with squats in the morning holding on for support.      He has noted slow progression of weakness and reduced coordination in the right arm/hand.     Sensation is generally the same.     Vision is fine.   Bladder function is fine on tamsulosin.  No UTI.      He sleeps well at night.  He sleeps better with CPAP.  We discussed different types of masks.  Mood is doing reasonably well.    He has a new baclofen pump (2021) that allow patient controlled boluses.   Because he is feeling weaker, he is trying to use fewer boluses.  Night time spasticity is doing ok.   He also takes clonazepam at night.   MS history:  In 1997, he had left optic neuritis and then had numbness in both feet. MRI was consistent with MS and a lumbar puncture was also performed and CSF was consistent with MS. Initially, he was placed on Copaxone. He had difficulties with compliance and 5 years later switched to Avonex. Unfortunate, he had difficulties tolerating Avonex and also had difficulty tolerating Rebif. In 2004, he started Tysabri but was only able to take 2 doses before was taken off the market. He restarted in 2005 and has  been on Tysabri since then with the exception of a 6 month holiday. Tolerates Tysabri well. He has not had any definite exacerbations though his gait has worsened over the past few years. He is JCV antibody negative.   He had recent MRI of the brain and spine and there were no changes.      Spasticity history: He was experiencing progressive spasticity in both legs.  He got a baclofen pump in 2013.  Prior to the pump he was having severe tonic spasticity as well as frequent phasic spasms.  These improved.  Due to the spasticity, baclofen was increased in 2016 and 2017 but he became weaker.  I began to see him again and we added clonidine to the baclofen and cut his dose and added nighttime boluses.  He did better with those settings.  Imaging: MRI of the cervical spine dated 07/25/2020 and  MRI of the cervical spine from 07/05/2015.  Both showed multiple T2 hyperintense foci within the spinal cord.  They are located posterolaterally to the right below the cervicomedullary junction, anteriorly adjacent to C3C4,  posterolaterally to the left adjacent to C4, posterior adjacent to C4-C5, both the left and the right adjacent to C4-C5, very large  right focus adjacent to C5, posterior laterally to the left adjacent to C6, towards the right adjacent to C6 and posterior laterally to the right adjacent to C7-T1.  None of the foci enhance.  There did not appear to be any new lesions between 2016 and 2021.   There is a right disc protrusion causing mild to moderate right foraminal narrowing.  At C5-C6, there is a right disc protrusion but no nerve root compression  The MRI of the brain from 07/25/2020 is unchanged compared to the 12/19/2015 MRI according to the official report.  Most foci are supratentorial but somewhat noted in the pons.    There are no enhancing lesions.   PHYSICAL EXAM  General: The patient is well-developed and well-nourished and in no acute distress.  Extremities:   There are no rashes or edema.     Neurologic Exam  Mental status: The patient is alert and oriented x 3 at the time of the examination.  Good focus/attention.    Speech is normal.  CN:   Extraocular muscles are normal.  No ptosis.    Symmetric facial strength and sensation.    PE/TP midline  Motor:  Muscle bulk is normal.  tone is increased in the right arm and minimally increased in the legs.  Strength is 4+/5 in the right arm, 5/5 in the left arm, 2-2+/5 in the right leg and 2+-3/5 in the left leg .   Reduced RAM in right hand  Sensation: He had symmetric sensation to touch and vibration today.  Gait and station: He is wheelchair-bound  Reflexes: Deep tendon reflexes are trace at the left knee and trace at the right knee.   No ankle clonus   ASSESSMENT  Multiple sclerosis (HCC) - Plan: Stratify JCV Antibody Test ([facility]), CBC with Differential/Platelet  High risk medication use - Plan: Stratify JCV Antibody Test ([facility]), CBC with Differential/Platelet  Spastic diplegia (HCC)  Presence of implanted infusion pump  Urinary urgency   PLAN: 1.  Continue the intrathecal pump : 1800 mcg/mL baclofen and 420 mcg/mL clonidine.  We reduce the continuous baclofen/clonidine.  Upon review of the printout, Dr. [name] Hahn noted that the boluses have been altered and he will return tomorrow for reprogramming.   2.   Continue Tysabri every 4 weeks.   Check JCV antibody  3.   Continue other med's    4.   Return in 6 months or sooner if there are new or worsening neurologic issues.  We can make further adjustments if needed before the next refill  Graylen Noboa A. Epimenio Foot, MD, PhD, FAAN Certified in Neurology, Clinical Neurophysiology, Sleep Medicine, Pain Medicine and Neuroimaging Director, [facility]  [facility] [address] [place], [place] [address] [phone]

## 2021-08-20 NOTE — Telephone Encounter (Signed)
I called the patient.  The patient came in for reprogramming on the document pump, when reviewing the print out, the bolus dosing has clearly been altered improperly, the patient will be getting too much on each bolus.  He will come back this afternoon for a correction.  The baclofen pump was reprogrammed.  The simple continuous rate was lowered by 11% to 430.4 mcg/day of baclofen, 100.42 mcg/day of clonidine.  The bolus schedule was reprogrammed to reflect the previous settings.  The patient will receive a 29.9 mcg bolus of baclofen, 6.99 mcg of clonidine with a total of up to 4 activations per day.  Lockout interval of 2 hours.  Duration of bolus is 5 minutes.  Reservoir volume of 14.9 cc, low reservoir alarm volume of 2.0 cc, next alarm date for refill is October 01, 2021.

## 2021-08-21 LAB — CBC WITH DIFFERENTIAL/PLATELET
Basophils Absolute: 0.1 10*3/uL (ref 0.0–0.2)
Basos: 1 %
EOS (ABSOLUTE): 0.8 10*3/uL — ABNORMAL HIGH (ref 0.0–0.4)
Eos: 9 %
Hematocrit: 41.1 % (ref 37.5–51.0)
Hemoglobin: 13.7 g/dL (ref 13.0–17.7)
Immature Grans (Abs): 0.1 10*3/uL (ref 0.0–0.1)
Immature Granulocytes: 1 %
Lymphocytes Absolute: 2.7 10*3/uL (ref 0.7–3.1)
Lymphs: 31 %
MCH: 28.9 pg (ref 26.6–33.0)
MCHC: 33.3 g/dL (ref 31.5–35.7)
MCV: 87 fL (ref 79–97)
Monocytes Absolute: 0.6 10*3/uL (ref 0.1–0.9)
Monocytes: 7 %
Neutrophils Absolute: 4.6 10*3/uL (ref 1.4–7.0)
Neutrophils: 51 %
Platelets: 206 10*3/uL (ref 150–450)
RBC: 4.74 x10E6/uL (ref 4.14–5.80)
RDW: 13.3 % (ref 11.6–15.4)
WBC: 8.8 10*3/uL (ref 3.4–10.8)

## 2021-08-22 ENCOUNTER — Other Ambulatory Visit (INDEPENDENT_AMBULATORY_CARE_PROVIDER_SITE_OTHER): Payer: Managed Care, Other (non HMO)

## 2021-08-22 ENCOUNTER — Other Ambulatory Visit: Payer: Self-pay

## 2021-08-22 ENCOUNTER — Ambulatory Visit (INDEPENDENT_AMBULATORY_CARE_PROVIDER_SITE_OTHER): Payer: 59 | Admitting: Psychology

## 2021-08-22 DIAGNOSIS — E039 Hypothyroidism, unspecified: Secondary | ICD-10-CM

## 2021-08-22 DIAGNOSIS — R739 Hyperglycemia, unspecified: Secondary | ICD-10-CM | POA: Diagnosis not present

## 2021-08-22 DIAGNOSIS — F4323 Adjustment disorder with mixed anxiety and depressed mood: Secondary | ICD-10-CM | POA: Diagnosis not present

## 2021-08-22 DIAGNOSIS — I10 Essential (primary) hypertension: Secondary | ICD-10-CM | POA: Diagnosis not present

## 2021-08-22 DIAGNOSIS — Z1159 Encounter for screening for other viral diseases: Secondary | ICD-10-CM | POA: Diagnosis not present

## 2021-08-22 DIAGNOSIS — E785 Hyperlipidemia, unspecified: Secondary | ICD-10-CM | POA: Diagnosis not present

## 2021-08-22 DIAGNOSIS — R7989 Other specified abnormal findings of blood chemistry: Secondary | ICD-10-CM

## 2021-08-22 LAB — COMPREHENSIVE METABOLIC PANEL
ALT: 22 U/L (ref 0–53)
AST: 14 U/L (ref 0–37)
Albumin: 3.9 g/dL (ref 3.5–5.2)
Alkaline Phosphatase: 89 U/L (ref 39–117)
BUN: 17 mg/dL (ref 6–23)
CO2: 33 mEq/L — ABNORMAL HIGH (ref 19–32)
Calcium: 8.8 mg/dL (ref 8.4–10.5)
Chloride: 101 mEq/L (ref 96–112)
Creatinine, Ser: 0.81 mg/dL (ref 0.40–1.50)
GFR: 104.27 mL/min (ref >60.00)
Glucose, Bld: 114 mg/dL — ABNORMAL HIGH (ref 70–99)
Potassium: 4 mEq/L (ref 3.5–5.1)
Sodium: 140 mEq/L (ref 135–145)
Total Bilirubin: 0.5 mg/dL (ref 0.2–1.2)
Total Protein: 6.2 g/dL (ref 6.0–8.3)

## 2021-08-22 LAB — LIPID PANEL
Cholesterol: 129 mg/dL (ref 0–200)
HDL: 48.3 mg/dL (ref >39.00)
LDL Cholesterol: 65 mg/dL (ref 0–99)
NonHDL: 81.03
Total CHOL/HDL Ratio: 3
Triglycerides: 80 mg/dL (ref 0.0–149.0)
VLDL: 16 mg/dL (ref 0.0–40.0)

## 2021-08-22 LAB — TSH: TSH: 0.22 u[IU]/mL — ABNORMAL LOW (ref 0.35–5.50)

## 2021-08-22 LAB — CBC
HCT: 39.5 % (ref 39.0–52.0)
Hemoglobin: 13.1 g/dL (ref 13.0–17.0)
MCHC: 33.2 g/dL (ref 30.0–36.0)
MCV: 88.8 fl (ref 78.0–100.0)
Platelets: 194 10*3/uL (ref 150.0–400.0)
RBC: 4.45 Mil/uL (ref 4.22–5.81)
RDW: 14.5 % (ref 11.5–15.5)
WBC: 7.8 10*3/uL (ref 4.0–10.5)

## 2021-08-22 LAB — HEPATITIS C ANTIBODY
Hepatitis C Ab: NONREACTIVE
SIGNAL TO CUT-OFF: 0.01 (ref <1.00)

## 2021-08-22 LAB — TESTOSTERONE: Testosterone: 205.96 ng/dL — ABNORMAL LOW (ref 300.00–890.00)

## 2021-08-22 LAB — HEMOGLOBIN A1C: Hgb A1c MFr Bld: 5.8 % (ref 4.6–6.5)

## 2021-08-23 ENCOUNTER — Other Ambulatory Visit: Payer: Self-pay | Admitting: Family Medicine

## 2021-08-26 ENCOUNTER — Other Ambulatory Visit: Payer: Self-pay

## 2021-08-26 ENCOUNTER — Ambulatory Visit (INDEPENDENT_AMBULATORY_CARE_PROVIDER_SITE_OTHER): Payer: Managed Care, Other (non HMO) | Admitting: Family Medicine

## 2021-08-26 VITALS — BP 106/63 | HR 91 | Temp 98.0°F | Resp 16 | Wt 291.0 lb

## 2021-08-26 DIAGNOSIS — E785 Hyperlipidemia, unspecified: Secondary | ICD-10-CM

## 2021-08-26 DIAGNOSIS — E6609 Other obesity due to excess calories: Secondary | ICD-10-CM

## 2021-08-26 DIAGNOSIS — R739 Hyperglycemia, unspecified: Secondary | ICD-10-CM | POA: Diagnosis not present

## 2021-08-26 DIAGNOSIS — E039 Hypothyroidism, unspecified: Secondary | ICD-10-CM | POA: Diagnosis not present

## 2021-08-26 DIAGNOSIS — I10 Essential (primary) hypertension: Secondary | ICD-10-CM | POA: Diagnosis not present

## 2021-08-26 DIAGNOSIS — Z23 Encounter for immunization: Secondary | ICD-10-CM

## 2021-08-26 NOTE — Progress Notes (Signed)
Subjective:   By signing my name below, I, Keith Morrow, attest that this documentation has been prepared under the direction and in the presence of Keith Lukes, MD. 08/26/2021   Patient ID: Keith Morrow, male    DOB: September 26, 1971, 50 y.o.   MRN: [id number]  Chief Complaint  Patient presents with   Follow-up    Pt would like left shoulder looked at    HPI Patient is in today for an office visit.  At his last lab work, his TSH level was low but he mentions he has not been feeling any symptoms.  He reports a flaky rash on the left side of his back that has been there for months and even though he hs tried to scratch it off, the rash is still present. The rash is not itchy or burning. He has a Paediatric nurse.   He is getting the flu vaccine today. He had Covid-19 in June 2022. He will be getting the booster vaccine later in the year.  Past Medical History:  Diagnosis Date   Acute bronchitis 05/09/2013   Allergy    Bronchitis, acute 12/10/2011   Candidal skin infection 05/05/2017   Chicken pox as a child   Constipation 02/20/2017   Coronary atherosclerosis of native coronary artery    a. NSTEMI 04/20/11: tx with Promus DES to LAD; bifurcation lesion with oDx 90% tx with POBA; b. staged PCI of right post. AV Branch with Promus DES; c. residual at cath 04/21/11:  AV groove CFX 50%, mRCA 30%,; EF 55%   Depression    Depression with anxiety    Dermatitis, contact 12/10/2011   Fatigue 10/15/2012   Heart attack ([place]) 04-20-11   HTN (hypertension)    Hyperlipidemia    Hypotestosteronism 11/17/2011   Hypothyroidism    Keloid scar    MS (multiple sclerosis) ([place])    Neuromuscular disorder ([place])    NUMBNESS/TINGLING   Obesity    Onychomycosis    Preventative health care 10/07/2011   Reflux 10/07/2011   Sleep apnea    Sun-damaged skin 02/17/2016   Testosterone deficiency 01/16/2012   Tinea pedis    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   Visual changes 11/13/2016    Past Surgical  History:  Procedure Laterality Date   CORONARY ANGIOPLASTY WITH STENT PLACEMENT     2012   PAIN PUMP IMPLANTATION N/A 08/29/2014   Procedure: Baclofen pump placement;  Surgeon: Erline Levine, MD;  Location: [facility];  Service: Neurosurgery;  Laterality: N/A;  Baclofen pump placement   PAIN PUMP IMPLANTATION Right 09/20/2020   Procedure: Right Baclofen pump replacement;  Surgeon: Erline Levine, MD;  Location: [facility];  Service: Neurosurgery;  Laterality: Right;  Right Baclofen pump replacement   PROGRAMABLE BACLOFEN PUMP REVISION  08/29/14   right wrist surgery     CTR   VASCULAR SURGERY     vericose vein in right femoral area   VASECTOMY     WISDOM TOOTH EXTRACTION      Family History  Problem Relation Age of Onset   Heart attack Mother 84   Thyroid disease Mother    Heart disease Mother    Thyroid disease Sister    Thyroid disease Brother    Heart disease Maternal Grandmother    Hypertension Maternal Grandmother    Hyperlipidemia Maternal Grandmother    Heart disease Maternal Grandfather    Hypertension Maternal Grandfather    Hyperlipidemia Maternal Grandfather    Stroke Paternal Grandfather  Thyroid disease Sister    Heart disease Father        2 stents   Coronary artery disease Other        questionable in father    Social History   Socioeconomic History   Marital status: Married    Spouse name: Not on file   Number of children: 3   Years of education: Not on file   Highest education level: Not on file  Occupational History   Not on file  Tobacco Use   Smoking status: Never   Smokeless tobacco: Never  Vaping Use   Vaping Use: Never used  Substance and Sexual Activity   Alcohol use: Yes    Comment: socially   Drug use: No   Sexual activity: Yes    Partners: Female    Comment: 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, works Transport planner, no dietary restrictions  Other Topics Concern   Not on file  Social History Narrative   Not on file   Social  Determinants of Health   Financial Resource Strain: Not on file  Food Insecurity: Not on file  Transportation Needs: Not on file  Physical Activity: Not on file  Stress: Not on file  Social Connections: Not on file  Intimate Partner Violence: Not on file    Outpatient Medications Prior to Visit  Medication Sig Dispense Refill   aspirin EC 81 MG tablet Take 81 mg by mouth daily.     atorvastatin (LIPITOR) 40 MG tablet TAKE 1 TABLET (40 MG TOTAL) BY MOUTH DAILY AT 6 PM. 90 tablet 3   BACLOFEN IT 1 Dose by Intrathecal route as directed.      busPIRone (BUSPAR) 10 MG tablet Take 1 tablet (10 mg total) by mouth 3 (three) times daily. 270 tablet 1   cetirizine (ZYRTEC) 10 MG tablet Take 10 mg by mouth daily as needed for allergies.     clobetasol cream (TEMOVATE) 0.05 % Apply topically 2 (two) times daily.     clonazePAM (KLONOPIN) 1 MG tablet Take 1 tablet (1 mg total) by mouth 4 (four) times daily. 360 tablet 1   Clonidine HCl POWD by Does not apply route. 726m/ml IT Baclofen. Total dose 157.875mday. Last pump refill 06/02/16     clotrimazole-betamethasone (LOTRISONE) cream Apply 1 application topically 2 (two) times daily as needed (anti-fungal). 30 g 2   cyclobenzaprine (FLEXERIL) 10 MG tablet TAKE 1 TABLET BY MOUTH THREE TIMES A DAY AS NEEDED FOR MUSCLE SPASMS 90 tablet 3   dalfampridine 10 MG TB12 TAKE 1 TABLET (10 MG TOTAL) EVERY 12 HOURS 180 tablet 3   DULoxetine (CYMBALTA) 60 MG capsule Take 1 capsule (60 mg total) by mouth daily. Brand name only 180 capsule 3   famotidine (PEPCID) 40 MG tablet TAKE 1 TABLET BY MOUTH EVERYDAY AT BEDTIME 90 tablet 3   furosemide (LASIX) 20 MG tablet 1 ta po daily x 5 days and then as needed for SOB, weight gain>3#, pedal edema and can take a second tab daily as needed 45 tablet 3   gabapentin (NEURONTIN) 600 MG tablet Take 1 tablet (600 mg total) by mouth 4 (four) times daily. 360 tablet 3   ibuprofen (ADVIL,MOTRIN) 200 MG tablet Take 200 mg by mouth  every 6 (six) hours as needed for moderate pain.     ketoconazole (NIZORAL) 2 % shampoo Apply topically.     levothyroxine (SYNTHROID) 150 MCG tablet TAKE 1 TABLET DAILY BEFORE BREAKFAST 90 tablet 1   methylphenidate (RITALIN)  10 MG tablet Take 1 tablet (10 mg total) by mouth 4 (four) times daily. 120 tablet 0   natalizumab (TYSABRI) 300 MG/15ML injection Inject 300 mg into the vein every 28 (twenty-eight) days.      nitroGLYCERIN (NITROSTAT) 0.4 MG SL tablet PLACE 1 TABLET UNDER THE TOUNGE EVERY 5 MINUTES AS NEEDED FOR CHEST PAIN (X 3 DOSES) 25 tablet 0   nystatin cream (MYCOSTATIN) Apply 1 application topically as directed. 30 g 2   omeprazole (PRILOSEC) 40 MG capsule Take 1 capsule (40 mg total) by mouth daily. 90 capsule 3   potassium chloride SA (KLOR-CON M20) 20 MEQ tablet TAKE 1 TABLET BY MOUTH EVERY DAY AS NEEDED WHEN TAKING A SECOND LASIX TAB ANY GIVEN DAY 90 tablet 3   sucralfate (CARAFATE) 1 g tablet TAKE 1 TABLET BY MOUTH 4 TIMES DAILY - WITH MEALS AND AT BEDTIME. TAKE TABLET OR LIQUID BUT NOT BOTH (Patient taking differently: As needed) 120 tablet 1   sucralfate (CARAFATE) 1 GM/10ML suspension Take 10 mLs (1 g total) by mouth 4 (four) times daily -  with meals and at bedtime. Take liquid or tablets but not both (Patient taking differently: Take 1 g by mouth 4 (four) times daily -  with meals and at bedtime. Take liquid or tablets but not both  As needed) 420 mL 3   tamsulosin (FLOMAX) 0.4 MG CAPS capsule Take 1 capsule (0.4 mg total) by mouth daily. 90 capsule 3   olmesartan-hydrochlorothiazide (BENICAR HCT) 20-12.5 MG tablet TAKE 1 TABLET BY MOUTH EVERY DAY 90 tablet 1   No facility-administered medications prior to visit.    No Known Allergies  Review of Systems  Constitutional:  Negative for fever and malaise/fatigue.  HENT:  Negative for congestion.   Eyes:  Negative for redness.  Respiratory:  Negative for shortness of breath.   Cardiovascular:  Negative for chest pain,  palpitations and leg swelling.  Gastrointestinal:  Negative for abdominal pain, blood in stool and nausea.  Genitourinary:  Negative for dysuria and frequency.  Musculoskeletal:  Negative for falls.  Skin:  Positive for rash (left side of back).  Neurological:  Negative for dizziness, loss of consciousness and headaches.  Endo/Heme/Allergies:  Negative for polydipsia.  Psychiatric/Behavioral:  Negative for depression. The patient is not nervous/anxious.       Objective:    Physical Exam Constitutional:      Appearance: Normal appearance. He is not ill-appearing.  HENT:     Head: Normocephalic and atraumatic.     Right Ear: Tympanic membrane, ear canal and external ear normal.     Left Ear: Tympanic membrane, ear canal and external ear normal.  Eyes:     Conjunctiva/sclera: Conjunctivae normal.  Cardiovascular:     Rate and Rhythm: Normal rate and regular rhythm.     Heart sounds: Normal heart sounds. No murmur heard. Pulmonary:     Breath sounds: Normal breath sounds. No wheezing.  Abdominal:     General: Bowel sounds are normal. There is no distension.     Palpations: Abdomen is soft.     Tenderness: There is no abdominal tenderness.     Hernia: No hernia is present.  Musculoskeletal:     Cervical back: Neck supple.  Lymphadenopathy:     Cervical: No cervical adenopathy.  Skin:    General: Skin is warm and dry.     Comments: Seborrhea on left side of back, cysts on back 1cm x 10cm raised rash on right side of face  Neurological:     Mental Status: He is alert and oriented to person, place, and time.  Psychiatric:        Behavior: Behavior normal.    BP 106/63   Pulse 91   Temp 98 F (36.7 C)   Resp 16   Wt 291 lb (132 kg)   SpO2 98%   BMI 36.37 kg/m  Wt Readings from Last 3 Encounters:  08/26/21 291 lb (132 kg)  08/20/21 296 lb 8 oz (134.5 kg)  05/24/21 (!) 310 lb (140.6 kg)    Diabetic Foot Exam - Simple   No data filed    Lab Results  Component  Value Date   WBC 7.8 08/22/2021   HGB 13.1 08/22/2021   HCT 39.5 08/22/2021   PLT 194.0 08/22/2021   GLUCOSE 114 (H) 08/22/2021   CHOL 129 08/22/2021   TRIG 80.0 08/22/2021   HDL 48.30 08/22/2021   LDLCALC 65 08/22/2021   ALT 22 08/22/2021   AST 14 08/22/2021   NA 140 08/22/2021   K 4.0 08/22/2021   CL 101 08/22/2021   CREATININE 0.81 08/22/2021   BUN 17 08/22/2021   CO2 33 (H) 08/22/2021   TSH 0.22 (L) 08/22/2021   PSA 0.52 02/11/2016   INR 1.00 04/21/2011   HGBA1C 5.8 08/22/2021    Lab Results  Component Value Date   TSH 0.22 (L) 08/22/2021   Lab Results  Component Value Date   WBC 7.8 08/22/2021   HGB 13.1 08/22/2021   HCT 39.5 08/22/2021   MCV 88.8 08/22/2021   PLT 194.0 08/22/2021   Lab Results  Component Value Date   NA 140 08/22/2021   K 4.0 08/22/2021   CO2 33 (H) 08/22/2021   GLUCOSE 114 (H) 08/22/2021   BUN 17 08/22/2021   CREATININE 0.81 08/22/2021   BILITOT 0.5 08/22/2021   ALKPHOS 89 08/22/2021   AST 14 08/22/2021   ALT 22 08/22/2021   PROT 6.2 08/22/2021   ALBUMIN 3.9 08/22/2021   CALCIUM 8.8 08/22/2021   ANIONGAP 10 09/18/2020   EGFR 105 04/02/2021   GFR 104.27 08/22/2021   Lab Results  Component Value Date   CHOL 129 08/22/2021   Lab Results  Component Value Date   HDL 48.30 08/22/2021   Lab Results  Component Value Date   LDLCALC 65 08/22/2021   Lab Results  Component Value Date   TRIG 80.0 08/22/2021   Lab Results  Component Value Date   CHOLHDL 3 08/22/2021   Lab Results  Component Value Date   HGBA1C 5.8 08/22/2021       Assessment & Plan:   Problem List Items Addressed This Visit     Hyperlipidemia    Encourage heart healthy diet such as MIND or DASH diet, increase exercise, avoid trans fats, simple carbohydrates and processed foods, consider a krill or fish or flaxseed oil cap daily. Tolerating Atorvastatin      Hypothyroidism - Primary    On Levothyroxine, continue to monitor      Relevant Orders    TSH   HTN (hypertension)    Well controlled, no changes to meds. Encouraged heart healthy diet such as the DASH diet and exercise as tolerated.       Obesity    Given flu shot today      Hyperglycemia    hgba1c acceptable, minimize simple carbs. Increase exercise as tolerated.       Other Visit Diagnoses     Need for influenza vaccination  Relevant Orders   Flu Vaccine QUAD 36+ mos IM (Fluarix, Fluzone & Afluria Quad PF (Completed)        No orders of the defined types were placed in this encounter.   I,Keith Morrow,acting as a Education administrator for Penni Homans, MD.,have documented all relevant documentation on the behalf of Penni Homans, MD,as directed by  Penni Homans, MD while in the presence of Penni Homans, MD.   I, Keith Lukes, MD., personally preformed the services described in this documentation.  All medical record entries made by the scribe were at my direction and in my presence.  I have reviewed the chart and discharge instructions (if applicable) and agree that the record reflects my personal performance and is accurate and complete. 08/26/2021

## 2021-08-27 ENCOUNTER — Other Ambulatory Visit: Payer: Self-pay | Admitting: *Deleted

## 2021-08-27 MED ORDER — OLMESARTAN MEDOXOMIL-HCTZ 20-12.5 MG PO TABS: 1.0000 | ORAL_TABLET | Freq: Every day | ORAL | 1 refills | Status: DC

## 2021-08-30 NOTE — Assessment & Plan Note (Signed)
hgba1c acceptable, minimize simple carbs. Increase exercise as tolerated.  

## 2021-08-30 NOTE — Assessment & Plan Note (Signed)
On Levothyroxine, continue to monitor 

## 2021-08-30 NOTE — Assessment & Plan Note (Signed)
Well controlled, no changes to meds. Encouraged heart healthy diet such as the DASH diet and exercise as tolerated.  °

## 2021-08-30 NOTE — Assessment & Plan Note (Signed)
Given flu shot  today 

## 2021-08-30 NOTE — Assessment & Plan Note (Addendum)
Encourage heart healthy diet such as MIND or DASH diet, increase exercise, avoid trans fats, simple carbohydrates and processed foods, consider a krill or fish or flaxseed oil cap daily. Tolerating Atorvastatin 

## 2021-09-12 ENCOUNTER — Ambulatory Visit (INDEPENDENT_AMBULATORY_CARE_PROVIDER_SITE_OTHER): Payer: 59 | Admitting: Psychology

## 2021-09-12 DIAGNOSIS — F4323 Adjustment disorder with mixed anxiety and depressed mood: Secondary | ICD-10-CM

## 2021-09-26 ENCOUNTER — Ambulatory Visit (INDEPENDENT_AMBULATORY_CARE_PROVIDER_SITE_OTHER): Payer: 59 | Admitting: Psychology

## 2021-09-26 DIAGNOSIS — F4323 Adjustment disorder with mixed anxiety and depressed mood: Secondary | ICD-10-CM

## 2021-10-10 ENCOUNTER — Ambulatory Visit (INDEPENDENT_AMBULATORY_CARE_PROVIDER_SITE_OTHER): Payer: 59 | Admitting: Psychology

## 2021-10-10 DIAGNOSIS — F4323 Adjustment disorder with mixed anxiety and depressed mood: Secondary | ICD-10-CM | POA: Diagnosis not present

## 2021-10-22 ENCOUNTER — Encounter: Payer: Self-pay | Admitting: Family Medicine

## 2021-10-22 ENCOUNTER — Other Ambulatory Visit: Payer: Self-pay

## 2021-10-22 DIAGNOSIS — Z820 Family history of epilepsy and other diseases of the nervous system: Secondary | ICD-10-CM

## 2021-10-22 MED ORDER — SUCRALFATE 1 G PO TABS: ORAL_TABLET | ORAL | 1 refills | Status: DC

## 2021-10-22 MED ORDER — SUCRALFATE 1 GM/10ML PO SUSP: 1.0000 g | Freq: Three times a day (TID) | ORAL | 3 refills | Status: DC

## 2021-10-22 NOTE — Telephone Encounter (Signed)
Referral placed.

## 2021-10-24 ENCOUNTER — Other Ambulatory Visit: Payer: Self-pay

## 2021-10-24 ENCOUNTER — Ambulatory Visit (INDEPENDENT_AMBULATORY_CARE_PROVIDER_SITE_OTHER): Payer: 59 | Admitting: Psychology

## 2021-10-24 ENCOUNTER — Ambulatory Visit: Payer: Managed Care, Other (non HMO) | Attending: Family Medicine | Admitting: Occupational Therapy

## 2021-10-24 ENCOUNTER — Encounter: Payer: Self-pay | Admitting: Occupational Therapy

## 2021-10-24 DIAGNOSIS — M6281 Muscle weakness (generalized): Secondary | ICD-10-CM | POA: Diagnosis present

## 2021-10-24 DIAGNOSIS — R29818 Other symptoms and signs involving the nervous system: Secondary | ICD-10-CM | POA: Insufficient documentation

## 2021-10-24 DIAGNOSIS — R2689 Other abnormalities of gait and mobility: Secondary | ICD-10-CM | POA: Insufficient documentation

## 2021-10-24 DIAGNOSIS — F4323 Adjustment disorder with mixed anxiety and depressed mood: Secondary | ICD-10-CM | POA: Diagnosis not present

## 2021-10-24 DIAGNOSIS — Z9181 History of falling: Secondary | ICD-10-CM | POA: Diagnosis present

## 2021-10-24 DIAGNOSIS — R2681 Unsteadiness on feet: Secondary | ICD-10-CM | POA: Diagnosis present

## 2021-10-24 DIAGNOSIS — R278 Other lack of coordination: Secondary | ICD-10-CM | POA: Diagnosis present

## 2021-10-25 NOTE — Therapy (Signed)
[facility] [address] [place], [place], [address] Phone: [phone]   Fax:  [phone]  Occupational Therapy Evaluation  Patient Details  Name: Keith Morrow MRN: [id number] Date of Birth: June 27, 1971 No data recorded  Encounter Date: 10/24/2021   OT End of Session - 10/24/21 1617     Visit Number 1    Number of Visits 7    Date for OT Re-Evaluation 01/22/22    Authorization Type [employer]    Authorization Time Period VL: unlimited    OT Start Time 1600    OT Stop Time 1700    OT Time Calculation (min) 60 min    Activity Tolerance Patient tolerated treatment well    Behavior During Therapy [facility] for tasks assessed/performed            Past Medical History:  Diagnosis Date   Acute bronchitis 05/09/2013   Allergy    Bronchitis, acute 12/10/2011   Candidal skin infection 05/05/2017   Chicken pox as a child   Constipation 02/20/2017   Coronary atherosclerosis of native coronary artery    a. NSTEMI 04/20/11: tx with Promus DES to LAD; bifurcation lesion with oDx 90% tx with POBA; b. staged PCI of right post. AV Branch with Promus DES; c. 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   Visual changes 11/13/2016    Past Surgical History:  Procedure Laterality Date   CORONARY ANGIOPLASTY WITH STENT PLACEMENT     2012   PAIN PUMP IMPLANTATION N/A 08/29/2014   Procedure:  Baclofen pump placement;  Surgeon: Maeola Harman, MD;  Location: [facility];  Service: Neurosurgery;  Laterality: N/A;  Baclofen pump placement   PAIN PUMP IMPLANTATION Right 09/20/2020   Procedure: Right Baclofen pump replacement;  Surgeon: Maeola Harman, MD;  Location: [facility];  Service: Neurosurgery;  Laterality: Right;  Right Baclofen pump replacement   PROGRAMABLE BACLOFEN PUMP REVISION  08/29/14   right wrist surgery     CTR   VASCULAR SURGERY     vericose vein in right femoral area   VASECTOMY     WISDOM TOOTH EXTRACTION      There were no vitals filed for this visit.   Subjective Assessment - 10/24/21 1609     Subjective  Pt arrives to session w/ primary concerns related to functional mobility and transfers, particularly bathroom and bed transfers. Pt has been using his power w/c more due to MS w/ progressively worsening LB strength and coordination, additionally stating that he is able to transfer to both sides, but is stronger going to his L side because R-sided weakness is greater compared to his L. Pt also reports his home is very accessible (large, custom walk-in shower, grab bars, bed cane, etc.) and that he has developed some strategies for trasnferring that are  effective, but has recently experienced 2 falls in the past 2 weeks, which is what led him to seek out an OT referral.    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 Yes    Pain Score 2     Pain Location Leg    Pain Orientation Right;Anterior    Pain Descriptors / Indicators Tightness    Pain Type Chronic pain;Neuropathic pain   Persistent pain 2/2 MS diagnosis   Pain Frequency Intermittent    Aggravating  Factors  Positioning, increased activity/mobility    Pain Relieving Factors Patient-controlled baclofen pump             [facility] OT Assessment - 10/24/21 1612       Assessment   Medical Diagnosis Multiple Sclerosis    Referring Provider (OT) [name], MD   MD   Hand Dominance Right   Left hand stronger   Next MD Visit 01/16/22 w/ PCP    Prior Therapy Currently in PT w/ [facility] in [place]      Precautions   Precautions None      Balance Screen   Has the patient fallen in the past 6 months Yes    How many times? 3 (2 in the last 2 weeks)    Has the patient had a decrease in activity level because of a fear of falling?  Yes    Is the patient reluctant to leave their home because of a fear of falling?  No      Home  Environment   Type of Home House    Home Access Ramped entrance    Home Layout Able to live on main level with bedroom/bathroom    Bathroom Personal assistant - power;Grab bars - tub/shower;Grab bars - toilet;Shower seat - built in;Hospital bed;Hand held shower head    Lives With Family   Wife and son; has a daughter at [employer] and a son in med school     Prior Function   Level of Independence Requires assistive device for independence    Vocation Full time employment    Psychologist, counselling; computer work    Leisure Cats and dogs, working in the yard      ADL   Eating/Feeding Independent    Grooming Independent    Tour manager independent    Lower Body Bathing Modified independent    Patent attorney Independent    Lower Body Dressing Modified independent   Occasionally requires Min A; frequently requires extended time for fasteners   Sales promotion account executive bars;Regular height toilet   Reports grab bars are on L side of toilet   Toileting - Clothing  Manipulation Modified independent   Reports leaning forward and stabilizing himself on the wall in front of him   Tub/Shower Transfer Modified independent    Tub/Shower Transfer Method Squat pivot   Pulls up w/ R hand on grab bars, rotates to back into shower, grabs another vertical bar w/ L hand; moves back to sit on  built-in Licensed conveyancer Walk in shower;Grab bars;Shower seat with back;Increased time    Transfers/Ambulation Related to ADL's Reports Min A w/ transfers in and out of bed due to difficulty lifting legs; uses a bed cane for squat/stand pivot transfer   Does have a leg lifter, but has not used it in a long time     Mobility   Mobility Status Independent    Mobility Status Comments Uses power wheelchair for mobility      Activity Tolerance   Activity Tolerance Comments Reports persistent fatigue does impact functional mobility, particularly when unsuccessful transferring on 1st attempt      Cognition   Overall Cognitive Status Within Functional Limits for tasks assessed      Observation/Other Assessments   Observations Significant increased BLE tone and spasticity. Able to extend knees and unable to control flexion; difficulty moving bilateral knees through PROM. Trace hip flexion of LLE.      Sensation   Additional Comments Reports frequent numbness in R fingertips and R legs 2/2 MS      Coordination   Gross Motor Movements are Fluid and Coordinated No    Fine Motor Movements are Fluid and Coordinated No      ROM / Strength   AROM / PROM / Strength AROM;Strength      AROM   Overall AROM  Within functional limits for tasks performed    AROM Assessment Site Shoulder;Elbow;Forearm;Wrist;Finger      Strength   Overall Strength Within functional limits for tasks performed    Strength Assessment Site Shoulder;Elbow             OT Short Term Goals - 10/24/21 1728       OT SHORT TERM GOAL #1   Title Pt will demonstrate use of leg lifter or  thigh strap to improve independence with bed mobility    Time 3    Period Weeks    Status New    Target Date 11/21/21      OT SHORT TERM GOAL #2   Title Pt will be able to button pants w/ Mod I using AE prn 100% of the time    Time 3    Period Weeks    Status New             OT Long Term Goals - 10/24/21 1729       OT LONG TERM GOAL #1   Title Pt will safely demonstrate threading pants over/off feet while seated w/ Mod I, using AE prn, in at least 2 attempts    Time 6    Period Weeks    Status New    Target Date 12/12/21      OT LONG TERM GOAL #2   Title Pt will demonstrate safe squat pivot transfer to and from w/c using AE prn (grab bars, slideboard) to improve independence and safety w/ functional mobility at home    Time 6    Period Weeks    Status New      OT LONG TERM GOAL #3   Title Pt will report completing toileting w/ transfers and clothing manipulation at Mod I successfully, using AE prn at least 75% of the time at home    Time 6    Period Weeks    Status New             Plan - 10/24/21 1622     Clinical Impression Statement Pt is a 50 y/o male  who presents to OP OT after seeking out a referral from his physician to address functional transfers and LB dressing. PMH includes relapsing form of MS w/ implanted baclofen pump (2021), HTN, HLD, obesity, sleep apnea w/ CPAP, depression/anxiety, and hx of MI in 2012. 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 Pt will benefit from skilled occupational therapy services to address strength and coordination, particularly w/ regard to functional transitions and transfers, balance, and introduction of compensatory strategies including AE prn in order to improve participation and safety w/ functional mobility.    OT Occupational Profile and History Detailed Assessment-  Review of Records and additional review of physical, cognitive, psychosocial history related to current functional performance    Occupational performance deficits (Please refer to evaluation for details): ADL's    Body Structure / Function / Physical Skills Muscle spasms;Endurance;Balance;ADL;UE functional use;Body mechanics;ROM;GMC;Coordination;Decreased knowledge of use of DME;Strength;Mobility;Tone    Rehab Potential Good    Clinical Decision Making Multiple treatment options, significant modification of task necessary    Comorbidities Affecting Occupational Performance: May have comorbidities impacting occupational performance    Modification or Assistance to Complete Evaluation  Min-Moderate modification of tasks or assist with assess necessary to complete eval    OT Frequency 1x / week    OT Duration 6 weeks    OT Treatment/Interventions Self-care/ADL training;Energy conservation;DME and/or AE instruction;Patient/family education;Balance training;Passive range of motion;Building services engineer;Therapeutic activities;Manual Therapy;Therapeutic exercise    Consulted and Agree with Plan of Care Patient            Patient will benefit from skilled therapeutic intervention in order to improve the following deficits and impairments:   Body Structure / Function / Physical Skills: Muscle spasms, Endurance, Balance, ADL, UE functional use, Body mechanics, ROM, GMC, Coordination, Decreased knowledge of use of DME, Strength, Mobility, Tone   Visit Diagnosis: Other abnormalities of gait and mobility  Unsteadiness on feet  Other symptoms and signs involving the nervous system  History of falling  Muscle weakness (generalized)  Other lack of coordination   Problem List Patient Active Problem List   Diagnosis Date Noted   COVID-19 05/24/2021   Low back pain 04/23/2021   Infection of left ear 10/08/2020   High risk medication use 08/14/2020   Numbness 08/14/2020   Hyperglycemia  07/04/2020   Rash 04/04/2020   Impetigo 04/04/2020   Presence of implanted infusion pump 02/27/2020   Spastic diplegia (HCC) 02/27/2020   Right leg pain 07/10/2019   Hypersomnolence 01/16/2019   Left shoulder pain 08/23/2018   Skin lesion of scalp 08/01/2018   Constipation 02/20/2017   Visual changes 11/13/2016   Right hand weakness 10/17/2016   Dysesthesia 06/02/2016   Erectile dysfunction 02/17/2016   Sun-damaged skin 02/17/2016   Gait disturbance 12/24/2015   Spasticity 12/24/2015   Urinary urgency 12/24/2015   Insomnia 12/24/2015   Dyspnea 10/10/2015   Leg edema, right 05/15/2015   Pedal edema 04/28/2015   Dermatitis 03/06/2014   Fatigue 10/15/2012   Hypotestosteronism 11/17/2011   Preventative health care 10/07/2011   GERD (gastroesophageal reflux disease) 10/07/2011   Obesity    Depression with anxiety    Onychomycosis    Tinea pedis    Keloid scar    CAD (coronary artery disease)    Multiple sclerosis (HCC)    Hyperlipidemia    Hypothyroidism  HTN (hypertension)     Rosie Fate, OTR/L, MSOT 10/25/2021, 4:30 PM  [facility] [address] [place], [place], [address] Phone: [phone]   Fax:  [phone]  Name: Keith Morrow MRN: [id number] Date of Birth: 01-17-1971

## 2021-10-29 ENCOUNTER — Other Ambulatory Visit: Payer: Self-pay

## 2021-10-29 ENCOUNTER — Encounter: Payer: Self-pay | Admitting: Occupational Therapy

## 2021-10-29 ENCOUNTER — Ambulatory Visit: Payer: Managed Care, Other (non HMO) | Admitting: Occupational Therapy

## 2021-10-29 DIAGNOSIS — R278 Other lack of coordination: Secondary | ICD-10-CM

## 2021-10-29 DIAGNOSIS — R2689 Other abnormalities of gait and mobility: Secondary | ICD-10-CM | POA: Diagnosis not present

## 2021-10-29 DIAGNOSIS — M6281 Muscle weakness (generalized): Secondary | ICD-10-CM

## 2021-10-29 DIAGNOSIS — Z9181 History of falling: Secondary | ICD-10-CM

## 2021-10-29 DIAGNOSIS — R2681 Unsteadiness on feet: Secondary | ICD-10-CM

## 2021-10-29 DIAGNOSIS — R29818 Other symptoms and signs involving the nervous system: Secondary | ICD-10-CM

## 2021-10-29 NOTE — Therapy (Signed)
[facility] [address] [place], [place], [address] Phone: [phone]   Fax:  [phone]  Occupational Therapy Treatment  Patient Details  Name: Keith Morrow MRN: [id number] Date of Birth: [birth year] Referring Provider (OT): [name], MD (MD)   Encounter Date: 10/29/2021   OT End of Session - 10/29/21 0854     Visit Number 2    Number of Visits 7    Date for OT Re-Evaluation 01/22/22    Authorization Type [employer]    Authorization Time Period VL: unlimited    OT Start Time 0847    OT Stop Time 0932    OT Time Calculation (min) 45 min    Activity Tolerance Patient tolerated treatment well    Behavior During Therapy [facility] for tasks assessed/performed            Past Medical History:  Diagnosis Date   Acute bronchitis 05/09/2013   Allergy    Bronchitis, acute 12/10/2011   Candidal skin infection 05/05/2017   Chicken pox as a child   Constipation 02/20/2017   Coronary atherosclerosis of native coronary artery    a. NSTEMI 04/20/11: tx with Promus DES to LAD; bifurcation lesion with oDx 90% tx with POBA; b. staged PCI of right post. AV Branch with Promus DES; c. 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   Visual changes 11/13/2016    Past Surgical History:  Procedure Laterality Date   CORONARY ANGIOPLASTY WITH STENT PLACEMENT     2012   PAIN PUMP  IMPLANTATION N/A 08/29/2014   Procedure: Baclofen pump placement;  Surgeon: Maeola Harman, MD;  Location: [facility];  Service: Neurosurgery;  Laterality: N/A;  Baclofen pump placement   PAIN PUMP IMPLANTATION Right 09/20/2020   Procedure: Right Baclofen pump replacement;  Surgeon: Maeola Harman, MD;  Location: [facility] OR;  Service: Neurosurgery;  Laterality: Right;  Right Baclofen pump replacement   PROGRAMABLE BACLOFEN PUMP REVISION  08/29/14   right wrist surgery     CTR   VASCULAR SURGERY     vericose vein in right femoral area   VASECTOMY     WISDOM TOOTH EXTRACTION      There were no vitals filed for this visit.   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) that allows patient-controlled boluses for spasticity managment    Limitations R-sided weakness > L side and progressive difficulties w/ gait and functional mobility    Patient Stated Goals Address bathroom transfers (toilet and shower), bed transfer, and LB dressing    Pain Score 1     Pain Location Abdomen    Pain Orientation Lower;Right  Pain Descriptors / Indicators Tightness    Pain Type Chronic pain   Persistent pain 2/2 MS dx            Treatment/Exercises - 10/29/21    ADLs: Functional Transfers  Discussed functional transfers from w/c <> toilet including recommendations for maneuvering power w/c in various spaces, ideal positioning for transfers to both L side and R side, and options for modification/compensatory strategies to increase efficiency and safety during transfers    Core Strengthening Modified sit-ups against resistance (yellow theraband) for core strengthening and activation to improve safety and stability during functional transfers; pt reported core activation even w/out resistance w/ OT encouraging pt to incorporate exercise into HEP.     AE: Genevie [name] Practiced using button hook, fastening 5 shirt buttons w/ Mod I; due to decreased FMC, OT discussed benefit of incorporating button hook into toileting in order to facilitate energy conservation/decreased fatigue and decreased task demand.            OT Short Term Goals - 10/29/21 0855       OT SHORT TERM GOAL #1   Title Pt will demonstrate use of leg lifter or thigh strap to improve independence with bed mobility    Time 3    Period Weeks    Status On-going    Target Date 11/21/21      OT SHORT TERM GOAL #2   Title Pt will be able to button pants w/ Mod I using AE prn 100% of the time    Time 3    Period Weeks    Status On-going             OT Long Term Goals - 10/24/21 1729       OT LONG TERM GOAL #1   Title Pt will safely demonstrate threading pants over/off feet while seated w/ Mod I, using AE prn, in at least 2 attempts    Time 6    Period Weeks    Status New    Target Date 12/12/21      OT LONG TERM GOAL #2   Title Pt will demonstrate safe squat pivot transfer to and from w/c using AE prn (grab bars, slideboard) to improve independence and safety w/ functional mobility at home    Time 6    Period Weeks    Status New      OT LONG TERM GOAL #3   Title Pt will report completing toileting w/ transfers and clothing manipulation at Mod I successfully, using AE prn at least 75% of the time at home    Time 6    Period Weeks    Status New             Plan - 10/29/21 0949     Clinical Impression Statement Session focused on problem-solving safe and most efficient methods for functional toilet transfers in various bathroom setups due to pt reporting that he has the most difficulty when he is unable to maneuver his w/c perpendicular to the toilet. Pt is unable to transfer from w/c <> toilet w/out grab bars and is having particular difficulty at work due to decreased room to navigate his w/c and pt having to transfer to his R side. Pt would benefit  from practicing strategies and OT will rearrange parallel bars next session to simulate this setup that pt reports he is having the most difficulty with. OT also reviewed benefit of button hook (providing handout for self-purchase) to decrease time in  standing for energy conservation.    OT Occupational Profile and History Detailed Assessment- Review of Records and additional review of physical, cognitive, psychosocial history related to current functional performance    Occupational performance deficits (Please refer to evaluation for details): ADL's    Body Structure / Function / Physical Skills Muscle spasms;Endurance;Balance;ADL;UE functional use;Body mechanics;ROM;GMC;Coordination;Decreased knowledge of use of DME;Strength;Mobility;Tone    Rehab Potential Good    Clinical Decision Making Multiple treatment options, significant modification of task necessary    Comorbidities Affecting Occupational Performance: May have comorbidities impacting occupational performance    Modification or Assistance to Complete Evaluation  Min-Moderate modification of tasks or assist with assess necessary to complete eval    OT Frequency 1x / week    OT Duration 6 weeks    OT Treatment/Interventions Self-care/ADL training;Energy conservation;DME and/or AE instruction;Patient/family education;Balance training;Passive range of motion;Building services engineer;Therapeutic activities;Manual Therapy;Therapeutic exercise    Plan Practice sit-to-stand transition or sliding/squat pivot transfer (w/c <> chair w/out armrests using parallel bars or bathroom in back building)    Consulted and Agree with Plan of Care Patient            Patient will benefit from skilled therapeutic intervention in order to improve the following deficits and impairments:   Body Structure / Function / Physical Skills: Muscle spasms, Endurance, Balance, ADL, UE functional use, Body mechanics, ROM, GMC, Coordination, Decreased knowledge of use  of DME, Strength, Mobility, Tone   Visit Diagnosis: Other abnormalities of gait and mobility  Unsteadiness on feet  Other symptoms and signs involving the nervous system  History of falling  Muscle weakness (generalized)  Other lack of coordination   Problem List Patient Active Problem List   Diagnosis Date Noted   COVID-19 05/24/2021   Low back pain 04/23/2021   Infection of left ear 10/08/2020   High risk medication use 08/14/2020   Numbness 08/14/2020   Hyperglycemia 07/04/2020   Rash 04/04/2020   Impetigo 04/04/2020   Presence of implanted infusion pump 02/27/2020   Spastic diplegia (HCC) 02/27/2020   Right leg pain 07/10/2019   Hypersomnolence 01/16/2019   Left shoulder pain 08/23/2018   Skin lesion of scalp 08/01/2018   Constipation 02/20/2017   Visual changes 11/13/2016   Right hand weakness 10/17/2016   Dysesthesia 06/02/2016   Erectile dysfunction 02/17/2016   Sun-damaged skin 02/17/2016   Gait disturbance 12/24/2015   Spasticity 12/24/2015   Urinary urgency 12/24/2015   Insomnia 12/24/2015   Dyspnea 10/10/2015   Leg edema, right 05/15/2015   Pedal edema 04/28/2015   Dermatitis 03/06/2014   Fatigue 10/15/2012   Hypotestosteronism 11/17/2011   Preventative health care 10/07/2011   GERD (gastroesophageal reflux disease) 10/07/2011   Obesity    Depression with anxiety    Onychomycosis    Tinea pedis    Keloid scar    CAD (coronary artery disease)    Multiple sclerosis (HCC)    Hyperlipidemia    Hypothyroidism    HTN (hypertension)     Rosie Fate, OTR/L, MSOT 10/29/2021, 4:59 PM  [facility] [address] [place], [place], [address] Phone: [phone]   Fax:  [phone]  Name: Keith Morrow MRN: [id number] Date of Birth: [birth year]

## 2021-11-07 ENCOUNTER — Ambulatory Visit: Payer: 59 | Admitting: Psychology

## 2021-11-11 ENCOUNTER — Ambulatory Visit: Payer: Managed Care, Other (non HMO) | Attending: Family Medicine | Admitting: Occupational Therapy

## 2021-11-11 ENCOUNTER — Other Ambulatory Visit: Payer: Self-pay

## 2021-11-11 DIAGNOSIS — R29818 Other symptoms and signs involving the nervous system: Secondary | ICD-10-CM | POA: Diagnosis present

## 2021-11-11 DIAGNOSIS — M6281 Muscle weakness (generalized): Secondary | ICD-10-CM | POA: Diagnosis present

## 2021-11-11 DIAGNOSIS — R2681 Unsteadiness on feet: Secondary | ICD-10-CM | POA: Diagnosis present

## 2021-11-11 DIAGNOSIS — R278 Other lack of coordination: Secondary | ICD-10-CM | POA: Insufficient documentation

## 2021-11-11 DIAGNOSIS — Z9181 History of falling: Secondary | ICD-10-CM | POA: Diagnosis present

## 2021-11-11 DIAGNOSIS — R2689 Other abnormalities of gait and mobility: Secondary | ICD-10-CM | POA: Diagnosis present

## 2021-11-12 ENCOUNTER — Encounter: Payer: Self-pay | Admitting: Occupational Therapy

## 2021-11-12 ENCOUNTER — Other Ambulatory Visit: Payer: Self-pay | Admitting: Family Medicine

## 2021-11-12 NOTE — Therapy (Signed)
[facility] [address] [place], [place], [address] Phone: [phone]   Fax:  [phone]  Occupational Therapy Treatment  Patient Details  Name: Keith Morrow MRN: [id number] Date of Birth: 03/17/1971 Referring Provider (OT): [name], MD (MD)   Encounter Date: 11/11/2021   OT End of Session - 11/11/21 1041     Visit Number 3    Number of Visits 7    Date for OT Re-Evaluation 01/22/22    Authorization Type [employer]    Authorization Time Period VL: unlimited    OT Start Time 1017    OT Stop Time 1102    OT Time Calculation (min) 45 min    Activity Tolerance Patient tolerated treatment well    Behavior During Therapy [facility] for tasks assessed/performed            Past Medical History:  Diagnosis Date   Acute bronchitis 05/09/2013   Allergy    Bronchitis, acute 12/10/2011   Candidal skin infection 05/05/2017   Chicken pox as a child   Constipation 02/20/2017   Coronary atherosclerosis of native coronary artery    a. NSTEMI 04/20/11: tx with Promus DES to LAD; bifurcation lesion with oDx 90% tx with POBA; b. staged PCI of right post. AV Branch with Promus DES; c. 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   Visual changes 11/13/2016    Past Surgical History:  Procedure Laterality Date   CORONARY ANGIOPLASTY WITH STENT PLACEMENT     2012   PAIN PUMP  IMPLANTATION N/A 08/29/2014   Procedure: Baclofen pump placement;  Surgeon: Maeola Harman, MD;  Location: [facility];  Service: Neurosurgery;  Laterality: N/A;  Baclofen pump placement   PAIN PUMP IMPLANTATION Right 09/20/2020   Procedure: Right Baclofen pump replacement;  Surgeon: Maeola Harman, MD;  Location: [facility];  Service: Neurosurgery;  Laterality: Right;  Right Baclofen pump replacement   PROGRAMABLE BACLOFEN PUMP REVISION  08/29/14   right wrist surgery     CTR   VASCULAR SURGERY     vericose vein in right femoral area   VASECTOMY     WISDOM TOOTH EXTRACTION      There were no vitals filed for this visit.   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 No/denies  Treatment/Exercises - 11/11/21    AE: Genevie [name]  Reviewed use of button hook and discussed methods for trialing device while seated and while standing during toileting activity; pt verbalized understanding    Functional Mobility: Transfers Reviewed success and problem-solved areas of improvement related to compensatory strategies for functional transfers discussed in prior session. Pt reports transfers have improved when he is able to back his w/c in and position himself perpendicular to the toilet. OT discussed potential benefit of slideboard transfer when moving to a surface w/out grab bars or bed cane; pt was receptive    AE: Thigh Lifter Practiced use of thigh lifter to simulate transitioning from sitting EOM to supine in bed due to pt  currently requiring assisting repositioning his legs into bed. Pt used gait belt to simulate thigh lifter and practiced lifting RLE and rotating to R side with w/c fully reclined to simulate bed surface; able to demonstrate good control w/ thigh strap (handout provided for self-purchase)            OT Short Term Goals - 10/29/21 0855       OT SHORT TERM GOAL #1   Title Pt will demonstrate use of leg lifter or thigh strap to improve independence with bed mobility    Time 3    Period Weeks    Status On-going    Target Date 11/21/21      OT SHORT TERM GOAL #2   Title Pt will be able to button pants w/ Mod I using AE prn 100% of the time    Time 3    Period Weeks    Status On-going             OT Long Term Goals - 10/24/21 1729       OT LONG TERM GOAL #1   Title Pt will safely demonstrate threading pants over/off feet while seated w/ Mod I, using AE prn, in at least 2 attempts    Time 6    Period Weeks    Status New    Target Date 12/12/21      OT LONG TERM GOAL #2   Title Pt will demonstrate safe squat pivot transfer to and from w/c using AE prn (grab bars, slideboard) to improve independence and safety w/ functional mobility at home    Time 6    Period Weeks    Status New      OT LONG TERM GOAL #3   Title Pt will report completing toileting w/ transfers and clothing manipulation at Mod I successfully, using AE prn at least 75% of the time at home    Time 6    Period Weeks    Status New             Plan - 11/11/21 1042     Clinical Impression Statement OT continued to problem-solve compensatory strategies to improve efficiency, safety, and independence w/ functional transfers, particularly related to toilet and bed trasnfers as pt experiences increased difficulty when transferring to toilet toward R side and requires assist w/ repositioning legs when transitioning to supine. Pt reported he has a standard leg lifter and it has not worked for him, so OT introduced  both Ableware leg wrap and thigh lifter strap, discussing pros and cons of each w/ pt. Due to extensor tone and increased proximal control providing, thigh strap was determined to likely be the optimal choice. Due to time constraints, OT was unable to facilitate practice on a mat, which  will likely be beneficial to continue to problem-solve most effective strategies when transitioning from EOB to supine.    OT Occupational Profile and History Detailed Assessment- Review of Records and additional review of physical, cognitive, psychosocial history related to current functional performance    Occupational performance deficits (Please refer to evaluation for details): ADL's    Body Structure / Function / Physical Skills Muscle spasms;Endurance;Balance;ADL;UE functional use;Body mechanics;ROM;GMC;Coordination;Decreased knowledge of use of DME;Strength;Mobility;Tone    Rehab Potential Good    Clinical Decision Making Multiple treatment options, significant modification of task necessary    Comorbidities Affecting Occupational Performance: May have comorbidities impacting occupational performance    Modification or Assistance to Complete Evaluation  Min-Moderate modification of tasks or assist with assess necessary to complete eval    OT Frequency 1x / week    OT Duration 6 weeks    OT Treatment/Interventions Self-care/ADL training;Energy conservation;DME and/or AE instruction;Patient/family education;Balance training;Passive range of motion;Building services engineer;Therapeutic activities;Manual Therapy;Therapeutic exercise    Plan Practice sit-to-stand transition and/or sliding/squat pivot transfer (simulating t/f to toilet toward R side; slideboard transfer to either side); practice w/ thigh lifter on mat    Recommended Other Services Pt currently receiving PT services at clinic    Consulted and Agree with Plan of Care Patient            Patient will benefit from skilled therapeutic intervention  in order to improve the following deficits and impairments:   Body Structure / Function / Physical Skills: Muscle spasms, Endurance, Balance, ADL, UE functional use, Body mechanics, ROM, GMC, Coordination, Decreased knowledge of use of DME, Strength, Mobility, Tone   Visit Diagnosis: Other abnormalities of gait and mobility  Muscle weakness (generalized)  Other lack of coordination  Other symptoms and signs involving the nervous system  Unsteadiness on feet   Problem List Patient Active Problem List   Diagnosis Date Noted   COVID-19 05/24/2021   Low back pain 04/23/2021   Infection of left ear 10/08/2020   High risk medication use 08/14/2020   Numbness 08/14/2020   Hyperglycemia 07/04/2020   Rash 04/04/2020   Impetigo 04/04/2020   Presence of implanted infusion pump 02/27/2020   Spastic diplegia (HCC) 02/27/2020   Right leg pain 07/10/2019   Hypersomnolence 01/16/2019   Left shoulder pain 08/23/2018   Skin lesion of scalp 08/01/2018   Constipation 02/20/2017   Visual changes 11/13/2016   Right hand weakness 10/17/2016   Dysesthesia 06/02/2016   Erectile dysfunction 02/17/2016   Sun-damaged skin 02/17/2016   Gait disturbance 12/24/2015   Spasticity 12/24/2015   Urinary urgency 12/24/2015   Insomnia 12/24/2015   Dyspnea 10/10/2015   Leg edema, right 05/15/2015   Pedal edema 04/28/2015   Dermatitis 03/06/2014   Fatigue 10/15/2012   Hypotestosteronism 11/17/2011   Preventative health care 10/07/2011   GERD (gastroesophageal reflux disease) 10/07/2011   Obesity    Depression with anxiety    Onychomycosis    Tinea pedis    Keloid scar    CAD (coronary artery disease)    Multiple sclerosis (HCC)    Hyperlipidemia    Hypothyroidism    HTN (hypertension)     Rosie Fate, OTR/L, MSOT 11/12/2021, 10:45 AM  [facility] [address] [place], [place], [address] Phone: [phone]   Fax:  [phone]  Name:  WILFORD MERRYFIELD MRN: [id number] Date of Birth: 02/16/1971

## 2021-11-21 ENCOUNTER — Ambulatory Visit (INDEPENDENT_AMBULATORY_CARE_PROVIDER_SITE_OTHER): Payer: 59 | Admitting: Psychology

## 2021-11-21 DIAGNOSIS — F331 Major depressive disorder, recurrent, moderate: Secondary | ICD-10-CM | POA: Diagnosis not present

## 2021-11-21 NOTE — Progress Notes (Signed)
[facility] Counselor/Therapist Progress Note  Patient ID: Keith Morrow, MRN: [id number],    Date: 11/21/2021  Time Spent: 11:00am-11:50am   50 minutes   Treatment Type: Individual Therapy  Reported Symptoms: stress  Mental Status Exam: Appearance:  Casual     Behavior: Appropriate  Motor: Normal  Speech/Language:  Normal Rate  Affect: Appropriate  Mood: normal  Thought process: normal  Thought content:   WNL  Sensory/Perceptual disturbances:   WNL  Orientation: oriented to person, place, time/date, and situation  Attention: Good  Concentration: Good  Memory: WNL  Fund of knowledge:  Good  Insight:   Good  Judgment:  Good  Impulse Control: Good   Risk Assessment: Danger to Self:  No Self-injurious Behavior: No Danger to Others: No Duty to Warn:no Physical Aggression / Violence:No  Access to Firearms a concern: No  Gang Involvement:No   Subjective: Pt present for face-to-face individual therapy via video Webex.  Pt consented to telehealth video therapy due to COVID 19 pandemic.   Location of pt: home Location of therapist: home office.  Pt talked about work.  He had to travel to [place] for work meetings.  He got a lot of good feedback about how valued he is at the company.  Pt states it was a good trip.   His mother accompanied him to help him with his issues getting in and out of bed bc of his MS.    They got along well and pt hopes that will rekindle their relationship.   Pt talked about his son Keith Morrow having his commission ceremony for [employer].  Keith Morrow just completed his engineering degree.  Pt and family are having a big celebration this weekend.  Pt is very proud of Keith Morrow.  He will go to [place] for flight training in February.  Pt will miss him.  Pt talked about feeling stress bc of the Christmas holiday season.  Addressed pt's stress.  He feels like he has to "herd" the family to get them to follow through on getting things done.  Worked on  Geneticist, molecular and how pt can take times out to de-stress.  Worked on Optician, dispensing.   Provided supportive therapy.     Interventions: Cognitive Behavioral Therapy and Insight-Oriented  Diagnosis: F33.1  Plan: See pt's Treatment Plan for depression in Therapy Charts.  (Treatment Plan Target Date: 03/21/2022) Pt is progressing with treatment goals.   Plan to continue to see pt every two weeks.    Keith Mcgloin, LCSW

## 2021-11-22 ENCOUNTER — Other Ambulatory Visit: Payer: Self-pay

## 2021-11-22 ENCOUNTER — Ambulatory Visit: Payer: Managed Care, Other (non HMO) | Admitting: Occupational Therapy

## 2021-11-22 DIAGNOSIS — R29818 Other symptoms and signs involving the nervous system: Secondary | ICD-10-CM

## 2021-11-22 DIAGNOSIS — R2681 Unsteadiness on feet: Secondary | ICD-10-CM

## 2021-11-22 DIAGNOSIS — R2689 Other abnormalities of gait and mobility: Secondary | ICD-10-CM

## 2021-11-22 DIAGNOSIS — M6281 Muscle weakness (generalized): Secondary | ICD-10-CM

## 2021-11-22 DIAGNOSIS — R278 Other lack of coordination: Secondary | ICD-10-CM

## 2021-11-22 DIAGNOSIS — Z9181 History of falling: Secondary | ICD-10-CM

## 2021-11-23 NOTE — Therapy (Signed)
[facility] [address] [place], [place], [address] Phone: [phone]   Fax:  [phone]  Occupational Therapy Treatment  Patient Details  Name: Keith Morrow MRN: [id number] Date of Birth: December 23, 1970 Referring Provider (OT): [name], MD (MD)   Encounter Date: 11/22/2021   OT End of Session - 11/22/21 1424     Visit Number 4    Number of Visits 7    Date for OT Re-Evaluation 01/22/22    Authorization Type [employer]    Authorization Time Period VL: unlimited    OT Start Time 0930    OT Stop Time 1015    OT Time Calculation (min) 45 min    Activity Tolerance Patient tolerated treatment well    Behavior During Therapy [facility] for tasks assessed/performed            Past Medical History:  Diagnosis Date   Acute bronchitis 05/09/2013   Allergy    Bronchitis, acute 12/10/2011   Candidal skin infection 05/05/2017   Chicken pox as a child   Constipation 02/20/2017   Coronary atherosclerosis of native coronary artery    a. NSTEMI 04/20/11: tx with Promus DES to LAD; bifurcation lesion with oDx 90% tx with POBA; b. staged PCI of right post. AV Branch with Promus DES; c. 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   Visual changes 11/13/2016    Past Surgical History:  Procedure Laterality Date   CORONARY ANGIOPLASTY WITH STENT PLACEMENT     2012   PAIN PUMP  IMPLANTATION N/A 08/29/2014   Procedure: Baclofen pump placement;  Surgeon: Maeola Harman, MD;  Location: [facility];  Service: Neurosurgery;  Laterality: N/A;  Baclofen pump placement   PAIN PUMP IMPLANTATION Right 09/20/2020   Procedure: Right Baclofen pump replacement;  Surgeon: Maeola Harman, MD;  Location: [facility] OR;  Service: Neurosurgery;  Laterality: Right;  Right Baclofen pump replacement   PROGRAMABLE BACLOFEN PUMP REVISION  08/29/14   right wrist surgery     CTR   VASCULAR SURGERY     vericose vein in right femoral area   VASECTOMY     WISDOM TOOTH EXTRACTION      There were no vitals filed for this visit.   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 No/denies  Treatment/Exercises - 11/22/21    Functional Mobility OT discussed pt's current method of transferring from w/c <> bed w/ or w/out bedcane. Pt currently Mod A to reposition his legs after transferring to bed using thigh strap transfer aids. Most difficulty occurs when transferring to/from a bed (e.g., a hotel bed during travel w/out a bed cane. To simulate this, pt completed slideboard transfer from w/c down incline to EOM w/ SPV for safety due to first attempt and was able to reposition BLEs onto mat w/out assist from therapist using thigh strap aids. To transfer back to w/c, pt Mod I using thigh strap aids to pull himself to seated position and reposition his legs at hip-width apart  while maneuvering power w/c closer for him to complete sit-to-stand transition. Pt able to pull to standing, using w/c at UE support, w/ Mod I and demonstrated 270 degree pivot toward R side to sit in w/c. OT discussed safety of this w/ pt and pt was able to demonstrate safe slideboard transfer from w/c to EOM toward R side at [facility] w/ verbal cues for positioning and then transfer up incline w/ CGA toward L side back to w/c.            OT Short Term Goals - 11/22/21 1425       OT SHORT TERM GOAL #1   Title Pt will demonstrate use of leg lifter or thigh strap to improve independence with bed mobility    Time 3    Period Weeks    Status Achieved   11/22/21   Target Date 11/21/21      OT SHORT TERM GOAL #2   Title Pt will be able to button pants w/ Mod I using AE prn 100% of the time    Time 3    Period Weeks    Status On-going             OT Long Term Goals - 11/22/21 1425       OT LONG TERM GOAL #1   Title Pt will safely demonstrate threading pants over/off feet while seated w/ Mod I, using AE prn, in at least 2 attempts    Time 6    Period Weeks    Status On-going    Target Date 12/12/21      OT LONG TERM GOAL #2   Title Pt will demonstrate safe squat pivot transfer to and from w/c using AE prn (grab bars, slideboard) to improve independence and safety w/ functional mobility at home    Time 6    Period Weeks    Status Achieved   11/22/21 - with slideboard     OT LONG TERM GOAL #3   Title Pt will report completing toileting w/ transfers and clothing manipulation at Mod I successfully, using AE prn at least 75% of the time at home    Time 6    Period Weeks    Status On-going             Plan - 11/22/21 1427     Clinical Impression Statement OT continued to problem-solve compensatory strategies to improve efficiency, safety, and independence w/ functional transfers, particularly related to bed mobility. Session focused on practicing use of self-purchased thigh  strap transfer/position aids and functional transfers from w/c <> surface w/out bed cane due to pt's report of increased difficulty w/ bed transfers when he is unable to use the bed cane he has set up at home. Pt able to demonstrate use  of thigh strap aids w/ SPV, requiring some cueing for positional problem-solving and safe slideboard transfers both from w/c <> EOM. OT recommended slideboard for transfers when unable to pull to standing due to pt's demonstration of his typical transfer involving moving his w/c so he is unable to pivot 90 degrees to sit, currently completing 270 degree pivot w/ a blind landing/decreased ability to visualize w/c seat. OT also discussed potential benefit of adjusting/requesting updated footplate for power w/c to allow for better positioning w/ feet/ankles at hip-distance apart vs fully adducted w/ causes hip external rotation and increased pressure on lateral border of L foot and increased propensity to hit knees on doorframes or furniture; pt was receptive.    OT Occupational Profile and History Detailed Assessment- Review of Records and additional review of physical, cognitive, psychosocial history related to current functional performance    Occupational performance deficits (Please refer to evaluation for details): ADL's    Body Structure / Function / Physical Skills Muscle spasms;Endurance;Balance;ADL;UE functional use;Body mechanics;ROM;GMC;Coordination;Decreased knowledge of use of DME;Strength;Mobility;Tone    Rehab Potential Good    Clinical Decision Making Multiple treatment options, significant modification of task necessary    Comorbidities Affecting Occupational Performance: May have comorbidities impacting occupational performance    Modification or Assistance to Complete Evaluation  Min-Moderate modification of tasks or assist with assess necessary to complete eval    OT Frequency 1x / week    OT Duration 6 weeks    OT Treatment/Interventions Self-care/ADL  training;Energy conservation;DME and/or AE instruction;Patient/family education;Balance training;Passive range of motion;Building services engineer;Therapeutic activities;Manual Therapy;Therapeutic exercise    Plan Practice sit-to-stand using w/c and pivoting 90 degrees; practice threading pants using AE prn    Recommended Other Services Pt currently receiving PT services at clinic    Consulted and Agree with Plan of Care Patient            Patient will benefit from skilled therapeutic intervention in order to improve the following deficits and impairments:   Body Structure / Function / Physical Skills: Muscle spasms, Endurance, Balance, ADL, UE functional use, Body mechanics, ROM, GMC, Coordination, Decreased knowledge of use of DME, Strength, Mobility, Tone   Visit Diagnosis: Other abnormalities of gait and mobility  Muscle weakness (generalized)  Other lack of coordination  Other symptoms and signs involving the nervous system  Unsteadiness on feet  History of falling   Problem List Patient Active Problem List   Diagnosis Date Noted   COVID-19 05/24/2021   Low back pain 04/23/2021   Infection of left ear 10/08/2020   High risk medication use 08/14/2020   Numbness 08/14/2020   Hyperglycemia 07/04/2020   Rash 04/04/2020   Impetigo 04/04/2020   Presence of implanted infusion pump 02/27/2020   Spastic diplegia (HCC) 02/27/2020   Right leg pain 07/10/2019   Hypersomnolence 01/16/2019   Left shoulder pain 08/23/2018   Skin lesion of scalp 08/01/2018   Constipation 02/20/2017   Visual changes 11/13/2016   Right hand weakness 10/17/2016   Dysesthesia 06/02/2016   Erectile dysfunction 02/17/2016   Sun-damaged skin 02/17/2016   Gait disturbance 12/24/2015   Spasticity 12/24/2015   Urinary urgency 12/24/2015   Insomnia 12/24/2015   Dyspnea 10/10/2015   Leg edema, right 05/15/2015   Pedal edema 04/28/2015   Dermatitis 03/06/2014   Fatigue 10/15/2012    Hypotestosteronism 11/17/2011   Preventative health care 10/07/2011   GERD (gastroesophageal reflux disease) 10/07/2011   Obesity    Depression with anxiety    Onychomycosis  Tinea pedis    Keloid scar    CAD (coronary artery disease)    Multiple sclerosis (HCC)    Hyperlipidemia    Hypothyroidism    HTN (hypertension)     Rosie Fate, OTR/L, MSOT 11/22/2021, 3:05 PM  [facility] [address] [place], [place], [address] Phone: [phone]   Fax:  [phone]  Name: Keith Morrow MRN: [id number] Date of Birth: [birth year]

## 2021-11-26 ENCOUNTER — Other Ambulatory Visit: Payer: Managed Care, Other (non HMO)

## 2021-11-27 ENCOUNTER — Other Ambulatory Visit (INDEPENDENT_AMBULATORY_CARE_PROVIDER_SITE_OTHER): Payer: Managed Care, Other (non HMO)

## 2021-11-27 DIAGNOSIS — E039 Hypothyroidism, unspecified: Secondary | ICD-10-CM

## 2021-11-28 ENCOUNTER — Other Ambulatory Visit: Payer: Self-pay

## 2021-11-28 ENCOUNTER — Ambulatory Visit: Payer: Managed Care, Other (non HMO) | Admitting: Occupational Therapy

## 2021-11-28 ENCOUNTER — Encounter: Payer: Self-pay | Admitting: Occupational Therapy

## 2021-11-28 DIAGNOSIS — Z9181 History of falling: Secondary | ICD-10-CM

## 2021-11-28 DIAGNOSIS — R278 Other lack of coordination: Secondary | ICD-10-CM

## 2021-11-28 DIAGNOSIS — R29818 Other symptoms and signs involving the nervous system: Secondary | ICD-10-CM

## 2021-11-28 DIAGNOSIS — R2681 Unsteadiness on feet: Secondary | ICD-10-CM

## 2021-11-28 DIAGNOSIS — R2689 Other abnormalities of gait and mobility: Secondary | ICD-10-CM | POA: Diagnosis not present

## 2021-11-28 DIAGNOSIS — M6281 Muscle weakness (generalized): Secondary | ICD-10-CM

## 2021-11-28 LAB — TSH: TSH: 0.12 u[IU]/mL — ABNORMAL LOW (ref 0.35–5.50)

## 2021-11-28 MED ORDER — LEVOTHYROXINE SODIUM 137 MCG PO TABS: 150.0000 ug | ORAL_TABLET | Freq: Every day | ORAL | 1 refills | Status: DC

## 2021-11-28 NOTE — Therapy (Signed)
[place]. [place], [place], [address] Phone: [phone]   Fax:  [phone]  Occupational Therapy Treatment & Discharge Summary  Patient Details  Name: Keith Morrow MRN: [id number] Date of Birth: 08-13-1971 Referring Provider (OT): Penni Homans, MD (MD)   Encounter Date: 11/28/2021   OT End of Session - 11/28/21 0940     Visit Number 5    Number of Visits 7    Date for OT Re-Evaluation 01/22/22    Authorization Type [employer]    Authorization Time Period VL: unlimited    OT Start Time 0935    OT Stop Time 1015    OT Time Calculation (min) 40 min    Activity Tolerance Patient tolerated treatment well    Behavior During Therapy [facility] for tasks assessed/performed            Past Medical History:  Diagnosis Date   Acute bronchitis 05/09/2013   Allergy    Bronchitis, acute 12/10/2011   Candidal skin infection 05/05/2017   Chicken pox as a child   Constipation 02/20/2017   Coronary atherosclerosis of native coronary artery    a. NSTEMI 04/20/11: tx with Promus DES to LAD; bifurcation lesion with oDx 90% tx with POBA; b. staged PCI of right post. AV Branch with Promus DES; c. 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   Visual changes 11/13/2016    Past Surgical History:  Procedure Laterality Date   CORONARY ANGIOPLASTY WITH STENT PLACEMENT     2012    PAIN PUMP IMPLANTATION N/A 08/29/2014   Procedure: Baclofen pump placement;  Surgeon: Erline Levine, MD;  Location: [facility];  Service: Neurosurgery;  Laterality: N/A;  Baclofen pump placement   PAIN PUMP IMPLANTATION Right 09/20/2020   Procedure: Right Baclofen pump replacement;  Surgeon: Erline Levine, MD;  Location: [facility];  Service: Neurosurgery;  Laterality: Right;  Right Baclofen pump replacement   PROGRAMABLE BACLOFEN PUMP REVISION  08/29/14   right wrist surgery     CTR   VASCULAR SURGERY     vericose vein in right femoral area   VASECTOMY     WISDOM TOOTH EXTRACTION      There were no vitals filed for this visit.   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 No/denies  Treatment/Exercises - 11/28/21    LB Dressing  Discussed and demonstrated adaptations and compensatory methods for LB dressing building off pt's current strategies, including sequencing for increased efficiency and cross-leg method w/ reacher; pt verbalized understanding    Functional Transfers Practiced functional transfers from w/c <> EOM simulating bed transfers w/out use of bed cane. Completed slideboard transfer toward L side w/ OT providing verbal cues for positioning of SB and adjusting hand placement during transfer; completed sliding transfer from EOM back to w/c w/out AE and OT problem-solving w/ pt safest and most effective positioning of UB support using w/c to pull. Able to complete both transfers w/ SPV for safety. Discussed additional  recommendations for safe carryover to home w/ pt verbalizing understanding.            OT Short Term Goals - 11/28/21 0948       OT SHORT TERM GOAL #1   Title Pt will demonstrate use of leg lifter or thigh strap to improve independence with bed mobility    Time 3    Period Weeks    Status Achieved   11/22/21   Target Date 11/21/21      OT SHORT TERM GOAL #2   Title Pt will be able to button pants w/ Mod I using AE prn 100% of the time    Time 3    Period Weeks    Status Achieved   11/28/21 - Mod I per pt report            OT Long Term Goals - 11/28/21 0949       OT LONG TERM GOAL #1   Title Pt will report safely threading pants over/off feet while seated w/ Mod I, using AE prn    Time 6    Period Weeks    Status Achieved   11/28/21   Target Date 12/12/21      OT LONG TERM GOAL #2   Title Pt will demonstrate safe squat pivot transfer to and from w/c using AE prn (grab bars, slideboard) to improve independence and safety w/ functional mobility at home    Time 6    Period Weeks    Status Achieved   11/22/21 - with slideboard     OT LONG TERM GOAL #3   Title Pt will report completing toileting w/ transfers and clothing manipulation at Mod I successfully, using AE prn at least 75% of the time at home    Time 6    Period Weeks    Status Achieved   11/28/21            Plan - 11/28/21 1607     Clinical Impression Statement Keith Morrow is a 50 y/o male who has been seen in OP OT to address difficulty w/ functional transfers 2/2 MS w/ decreased control and strength of BLEs. Pt has shown improvements in understanding most efficient positioning for transfers and independence w/ trialed and recommended AE w/ all STGs and LTGs met. OT reviewed previously discussed strategies, answering questions as able, and practiced sliding transfers in 2nd consecutive session to ensure safety and problem-solve w/ pt prn. Pt verbalizes understanding of strategies and is satisfied w/ his  current functional level; pt is appropriate for d/c from skilled OT at this time and is currently agreeable to discharge plan.   OT Occupational Profile and History Detailed Assessment- Review of Records and additional review of physical, cognitive, psychosocial history related to current functional performance    Occupational  performance deficits (Please refer to evaluation for details): ADL's    Body Structure / Function / Physical Skills Muscle spasms;Endurance;Balance;ADL;UE functional use;Body mechanics;ROM;GMC;Coordination;Decreased knowledge of use of DME;Strength;Mobility;Tone    Rehab Potential Good    Clinical Decision Making Multiple treatment options, significant modification of task necessary    Comorbidities Affecting Occupational Performance: May have comorbidities impacting occupational performance    Modification or Assistance to Complete Evaluation  Min-Moderate modification of tasks or assist with assess necessary to complete eval    OT Frequency 1x / week    OT Duration 6 weeks    OT Treatment/Interventions Self-care/ADL training;Energy conservation;DME and/or AE instruction;Patient/family education;Balance training;Passive range of motion;Therapist, nutritional;Therapeutic activities;Manual Therapy;Therapeutic exercise    Plan D/C    Recommended Other Services Pt currently receiving PT services at clinic    Consulted and Agree with Plan of Care Patient            Patient will benefit from skilled therapeutic intervention in order to improve the following deficits and impairments:   Body Structure / Function / Physical Skills: Muscle spasms, Endurance, Balance, ADL, UE functional use, Body mechanics, ROM, GMC, Coordination, Decreased knowledge of use of DME, Strength, Mobility, Tone   OCCUPATIONAL THERAPY DISCHARGE SUMMARY  Visits from Start of Care: 5  Current functional level related to goals / functional outcomes: Pt is able to use button hook and thigh strap  assists independently, consistently reports Mod I w/ LB dressing and toileting (transfers and clothing manipulation), and demonstrated sliding/sqat pivot transfer from w/c <> EOM.   Remaining deficits: Decreased LB strength and coordination; tone/spasticity   Education / Equipment: AE including button hook, thigh lifter assist aids, and slideboard; core strengthening exercises; compensatory and adaptive strategies for LB dressing, bed mobility, and toilet transfers; positioning for functional transfers  Patient agrees to discharge. Patient goals were met. Patient is being discharged due to being pleased with the current functional level.   Visit Diagnosis: Other abnormalities of gait and mobility  Unsteadiness on feet  Muscle weakness (generalized)  Other lack of coordination  Other symptoms and signs involving the nervous system  History of falling   Problem List Patient Active Problem List   Diagnosis Date Noted   COVID-19 05/24/2021   Low back pain 04/23/2021   Infection of left ear 10/08/2020   High risk medication use 08/14/2020   Numbness 08/14/2020   Hyperglycemia 07/04/2020   Rash 04/04/2020   Impetigo 04/04/2020   Presence of implanted infusion pump 02/27/2020   Spastic diplegia ([place]) 02/27/2020   Right leg pain 07/10/2019   Hypersomnolence 01/16/2019   Left shoulder pain 08/23/2018   Skin lesion of scalp 08/01/2018   Constipation 02/20/2017   Visual changes 11/13/2016   Right hand weakness 10/17/2016   Dysesthesia 06/02/2016   Erectile dysfunction 02/17/2016   Sun-damaged skin 02/17/2016   Gait disturbance 12/24/2015   Spasticity 12/24/2015   Urinary urgency 12/24/2015   Insomnia 12/24/2015   Dyspnea 10/10/2015   Leg edema, right 05/15/2015   Pedal edema 04/28/2015   Dermatitis 03/06/2014   Fatigue 10/15/2012   Hypotestosteronism 11/17/2011   Preventative health care 10/07/2011   GERD (gastroesophageal reflux disease) 10/07/2011   Obesity     Depression with anxiety    Onychomycosis    Tinea pedis    Keloid scar    CAD (coronary artery disease)    Multiple sclerosis ([place])    Hyperlipidemia    Hypothyroidism    HTN (hypertension)     Kathrine Cords,  OTR/L, MSOT 11/28/2021, 4:09 PM  [place]. [place], [place], [address] Phone: [phone]   Fax:  [phone]  Name: CHRISTOFER SHEN MRN: [id number] Date of Birth: [birth year]

## 2021-12-12 ENCOUNTER — Ambulatory Visit (INDEPENDENT_AMBULATORY_CARE_PROVIDER_SITE_OTHER): Payer: 59 | Admitting: Psychology

## 2021-12-12 DIAGNOSIS — F331 Major depressive disorder, recurrent, moderate: Secondary | ICD-10-CM | POA: Diagnosis not present

## 2021-12-12 NOTE — Progress Notes (Signed)
[facility] Counselor/Therapist Progress Note  Patient ID: Keith Morrow, MRN: [id number],    Date: 12/12/2021  Time Spent: 3:00pm-3:50pm   50 minutes   Treatment Type: Individual Therapy  Reported Symptoms: stress  Mental Status Exam: Appearance:  Casual     Behavior: Appropriate  Motor: Normal  Speech/Language:  Normal Rate  Affect: Appropriate  Mood: normal  Thought process: normal  Thought content:   WNL  Sensory/Perceptual disturbances:   WNL  Orientation: oriented to person, place, time/date, and situation  Attention: Good  Concentration: Good  Memory: WNL  Fund of knowledge:  Good  Insight:   Good  Judgment:  Good  Impulse Control: Good   Risk Assessment: Danger to Self:  No Self-injurious Behavior: No Danger to Others: No Duty to Warn:no Physical Aggression / Violence:No  Access to Firearms a concern: No  Gang Involvement:No   Subjective: Pt present for face-to-face individual therapy via video Webex.  Pt consented to telehealth video therapy due to COVID 19 pandemic.   Location of pt: home Location of therapist: home office.  Pt talked about his Christmas.   He states they had a good time with family.  All the kids were home and there were not many arguments. Pt talked about Marcello Moores leaving for [employer] mid February to go active duty in [employer].  Pt talked about Jinny Blossom planning to take a trip to [place] in June with a friend and [name]'s two grandmothers.   Pt talked about Lelon Frohlich having a mass in her neck.  It was detected by the dentist.  She got a radiology assessment of the growth and it is a significant golf ball sized mass.   Lelon Frohlich has to have the mass removed and will have surgery as soon as it can be scheduled.  Addressed pt's concerns.  He is trying to stay focused on the present moment and keep "what if " thoughts at [place].   Provided supportive therapy.     Interventions: Cognitive Behavioral Therapy and  Insight-Oriented  Diagnosis: F33.1  Plan: See pt's Treatment Plan for depression in Therapy Charts.  (Treatment Plan Target Date: 03/21/2022) Pt is progressing toward treatment goals.   Plan to continue to see pt every two weeks.    Eyvette Cordon, LCSW

## 2021-12-18 ENCOUNTER — Telehealth: Payer: Self-pay | Admitting: *Deleted

## 2021-12-18 NOTE — Telephone Encounter (Signed)
Faxed most recent office note, labs, MRI report to [facility] at [phone]. Received fax confirmation. Advised that pt will be coming 12/19/21 to do JCV and will fax results once available. Asked them to fax updated order needing MD signature to [place] at [phone].

## 2021-12-18 NOTE — Telephone Encounter (Signed)
Took call from infusion suite and spoke w/ Cathy/[facility]. Pt Tysabri orders expiring 02/05/22, needing updated orders. They also asked for updated clinicals/JCV ab result. I reviewed and pt never came back to complete JCV lab ordered back in September that he missed. Advised I will f/u with pt to come to complete asap. She will fax orders for MD to sign to [phone]. She asked we go ahead and fax office notes to [phone].   Called pt at [phone]. Scheduled for him to come tomorrow at 8am w/ lab to complete JCV.  He also states he is due to get new WC. Has been 5 years. Works w/ [employer]. I placed note on upcoming appt 03/07/22 so that MD can address this at that visit as well.

## 2021-12-19 ENCOUNTER — Other Ambulatory Visit: Payer: Managed Care, Other (non HMO)

## 2021-12-19 NOTE — Telephone Encounter (Signed)
Called pt because he missed his lab appt this am. He states he had to take pet to emergency vet last night and unable to make it. Requested to come next week. I r/s for 12/23/21 at 11am.

## 2021-12-21 ENCOUNTER — Other Ambulatory Visit: Payer: Self-pay | Admitting: Family Medicine

## 2021-12-23 ENCOUNTER — Other Ambulatory Visit (INDEPENDENT_AMBULATORY_CARE_PROVIDER_SITE_OTHER): Payer: Self-pay

## 2021-12-23 ENCOUNTER — Other Ambulatory Visit: Payer: Self-pay | Admitting: *Deleted

## 2021-12-23 DIAGNOSIS — Z0289 Encounter for other administrative examinations: Secondary | ICD-10-CM

## 2021-12-23 DIAGNOSIS — Z79899 Other long term (current) drug therapy: Secondary | ICD-10-CM

## 2021-12-23 DIAGNOSIS — G35 Multiple sclerosis: Secondary | ICD-10-CM

## 2021-12-23 NOTE — Progress Notes (Addendum)
°  Pt came to office today to get labwork: JCV ab. In electric wheelchair. Asked to get help to obtain updated weight. He was able to get up w/ 2 assist. Current weight: 307lb  Placed JCV lab in [facility] lock box for routine lab pick up. Results pending.

## 2021-12-26 ENCOUNTER — Ambulatory Visit (INDEPENDENT_AMBULATORY_CARE_PROVIDER_SITE_OTHER): Payer: 59 | Admitting: Psychology

## 2021-12-26 DIAGNOSIS — F331 Major depressive disorder, recurrent, moderate: Secondary | ICD-10-CM

## 2021-12-26 NOTE — Progress Notes (Signed)
[facility] Counselor/Therapist Progress Note  Patient ID: Keith Morrow, MRN: [id number],    Date: 12/26/2021  Time Spent: 2:00pm-2:50pm   50 minutes   Treatment Type: Individual Therapy  Reported Symptoms: stress  Mental Status Exam: Appearance:  Casual     Behavior: Appropriate  Motor: Normal  Speech/Language:  Normal Rate  Affect: Appropriate  Mood: normal  Thought process: normal  Thought content:   WNL  Sensory/Perceptual disturbances:   WNL  Orientation: oriented to person, place, time/date, and situation  Attention: Good  Concentration: Good  Memory: WNL  Fund of knowledge:  Good  Insight:   Good  Judgment:  Good  Impulse Control: Good   Risk Assessment: Danger to Self:  No Self-injurious Behavior: No Danger to Others: No Duty to Warn:no Physical Aggression / Violence:No  Access to Firearms a concern: No  Gang Involvement:No   Subjective: Pt present for face-to-face individual therapy via video Webex.  Pt consented to telehealth video therapy due to COVID 19 pandemic.   Location of pt: home Location of therapist: home office.  Pt states he has had a rough week.   He had to take his cat to the emergency vet.  The diagnosis was not good and so the cat had to be put down.  The family has been very upset about the loss.   Helped pt process his feelings and grief.    Pt's other cat got sick with a UTI but is being treated and will be ok.   Pt talked about Keith Fus leaving in less than 30 days.   He is starting flight school.  Pt and wife are anticipating the move and will miss him.   Keith Morrow is having a rough start to his semester.   Addressed pt's concerns about his kids.   Pt talked about [name]'s health.  She will meet with the surgeon and have a biopsy of the mass and then have it removed.  Addressed pt's concerns about Keith Morrow. Pt talked about work.  He has been working long hours.  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    Provided supportive therapy.     Interventions: Cognitive Behavioral Therapy and Insight-Oriented  Diagnosis: F33.1  Plan: See pt's Treatment Plan for depression in Therapy Charts.  (Treatment Plan Target Date: 03/21/2022) Pt is progressing toward treatment goals.   Plan to continue to see pt every two weeks.    Kristan Votta, LCSW

## 2021-12-27 ENCOUNTER — Encounter: Payer: Self-pay | Admitting: Family Medicine

## 2021-12-30 NOTE — Telephone Encounter (Signed)
JCV ab drawn on 12/23/21 negative, index: 0.13. faxed copy of results report to [facility] at [phone]. Received fax confirmation.

## 2022-01-02 ENCOUNTER — Encounter: Payer: Self-pay | Admitting: Neurology

## 2022-01-09 ENCOUNTER — Ambulatory Visit: Payer: 59 | Admitting: Psychology

## 2022-01-16 ENCOUNTER — Encounter: Payer: Self-pay | Admitting: Family Medicine

## 2022-01-16 ENCOUNTER — Ambulatory Visit (INDEPENDENT_AMBULATORY_CARE_PROVIDER_SITE_OTHER): Payer: Managed Care, Other (non HMO) | Admitting: Family Medicine

## 2022-01-16 VITALS — BP 108/69 | HR 108 | Temp 97.8°F | Resp 16 | Wt 306.8 lb

## 2022-01-16 DIAGNOSIS — I10 Essential (primary) hypertension: Secondary | ICD-10-CM | POA: Diagnosis not present

## 2022-01-16 DIAGNOSIS — R739 Hyperglycemia, unspecified: Secondary | ICD-10-CM | POA: Diagnosis not present

## 2022-01-16 DIAGNOSIS — E039 Hypothyroidism, unspecified: Secondary | ICD-10-CM | POA: Diagnosis not present

## 2022-01-16 DIAGNOSIS — L821 Other seborrheic keratosis: Secondary | ICD-10-CM

## 2022-01-16 DIAGNOSIS — E785 Hyperlipidemia, unspecified: Secondary | ICD-10-CM

## 2022-01-16 DIAGNOSIS — L8990 Pressure ulcer of unspecified site, unspecified stage: Secondary | ICD-10-CM

## 2022-01-16 DIAGNOSIS — L89891 Pressure ulcer of other site, stage 1: Secondary | ICD-10-CM

## 2022-01-17 LAB — COMPREHENSIVE METABOLIC PANEL
ALT: 16 U/L (ref 0–53)
AST: 13 U/L (ref 0–37)
Albumin: 4.2 g/dL (ref 3.5–5.2)
Alkaline Phosphatase: 76 U/L (ref 39–117)
BUN: 17 mg/dL (ref 6–23)
CO2: 32 mEq/L (ref 19–32)
Calcium: 9.2 mg/dL (ref 8.4–10.5)
Chloride: 101 mEq/L (ref 96–112)
Creatinine, Ser: 0.92 mg/dL (ref 0.40–1.50)
GFR: 98.09 mL/min (ref >60.00)
Glucose, Bld: 104 mg/dL — ABNORMAL HIGH (ref 70–99)
Potassium: 3.8 mEq/L (ref 3.5–5.1)
Sodium: 140 mEq/L (ref 135–145)
Total Bilirubin: 0.6 mg/dL (ref 0.2–1.2)
Total Protein: 6.6 g/dL (ref 6.0–8.3)

## 2022-01-17 LAB — T4, FREE: Free T4: 0.79 ng/dL (ref 0.60–1.60)

## 2022-01-17 LAB — CBC
HCT: 39.9 % (ref 39.0–52.0)
Hemoglobin: 13.3 g/dL (ref 13.0–17.0)
MCHC: 33.2 g/dL (ref 30.0–36.0)
MCV: 87.3 fl (ref 78.0–100.0)
Platelets: 229 10*3/uL (ref 150.0–400.0)
RBC: 4.57 Mil/uL (ref 4.22–5.81)
RDW: 14.7 % (ref 11.5–15.5)
WBC: 7.7 10*3/uL (ref 4.0–10.5)

## 2022-01-17 LAB — LIPID PANEL
Cholesterol: 137 mg/dL (ref 0–200)
HDL: 49.2 mg/dL (ref >39.00)
LDL Cholesterol: 63 mg/dL (ref 0–99)
NonHDL: 87.85
Total CHOL/HDL Ratio: 3
Triglycerides: 125 mg/dL (ref 0.0–149.0)
VLDL: 25 mg/dL (ref 0.0–40.0)

## 2022-01-17 LAB — HEMOGLOBIN A1C: Hgb A1c MFr Bld: 5.8 % (ref 4.6–6.5)

## 2022-01-17 LAB — TSH: TSH: 0.74 u[IU]/mL (ref 0.35–5.50)

## 2022-01-20 DIAGNOSIS — L8990 Pressure ulcer of unspecified site, unspecified stage: Secondary | ICD-10-CM | POA: Insufficient documentation

## 2022-01-20 DIAGNOSIS — L821 Other seborrheic keratosis: Secondary | ICD-10-CM | POA: Insufficient documentation

## 2022-01-20 NOTE — Assessment & Plan Note (Signed)
Well controlled, no changes to meds. Encouraged heart healthy diet such as the DASH diet and exercise as tolerated.  °

## 2022-01-20 NOTE — Assessment & Plan Note (Signed)
Tolerating statin, encouraged heart healthy diet, avoid trans fats, minimize simple carbs and saturated fats. Increase exercise as tolerated 

## 2022-01-20 NOTE — Assessment & Plan Note (Signed)
Has sores at lateral plantar surface of both feet right more than left. He is wheelchair bound and sits for many hours with his feet in the same position. Discussed strategies for changing the pressure points on feet during the day. Have referred to podiatry for further consideration.

## 2022-01-20 NOTE — Assessment & Plan Note (Signed)
Irritated in left axillae. He has an appt with neurology next month offered reassurance that the lesion is not overly concerning but advised evaluation by dermatology due to irritation.

## 2022-01-20 NOTE — Assessment & Plan Note (Signed)
On Levothyroxine, continue to monitor 

## 2022-01-20 NOTE — Assessment & Plan Note (Signed)
hgba1c acceptable, minimize simple carbs. Increase exercise as tolerated.  

## 2022-01-20 NOTE — Progress Notes (Signed)
Subjective:    Patient ID: Keith Morrow, male    DOB: 1971-08-20, 51 y.o.   MRN: [id number]  No chief complaint on file.   HPI Patient is in today for follow up on chronic medical concerns. No recent febrile illness or hospitalizations. He is worried about some pressure sores on both feet secondary to sitting in WC so many hours a day. He is also worried about an irritated lesion in his left axillae. He has an appt with dermatology but not til next month. No other acute concerns. Denies CP/palp/SOB/HA/congestion/fevers/GI or GU c/o. Taking meds as prescribed   Past Medical History:  Diagnosis Date   Acute bronchitis 05/09/2013   Allergy    Bronchitis, acute 12/10/2011   Candidal skin infection 05/05/2017   Chicken pox as a child   Constipation 02/20/2017   Coronary atherosclerosis of native coronary artery    a. NSTEMI 04/20/11: tx with Promus DES to LAD; bifurcation lesion with oDx 90% tx with POBA; b. staged PCI of right post. AV Branch with Promus DES; c. 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   Visual changes 11/13/2016    Past Surgical History:  Procedure Laterality Date   CORONARY ANGIOPLASTY WITH STENT PLACEMENT     2012   PAIN PUMP IMPLANTATION N/A 08/29/2014   Procedure: Baclofen pump placement;  Surgeon: Erline Levine, MD;  Location: [facility];  Service: Neurosurgery;  Laterality: N/A;  Baclofen pump placement    PAIN PUMP IMPLANTATION Right 09/20/2020   Procedure: Right Baclofen pump replacement;  Surgeon: Erline Levine, MD;  Location: [facility];  Service: Neurosurgery;  Laterality: Right;  Right Baclofen pump replacement   PROGRAMABLE BACLOFEN PUMP REVISION  08/29/14   right wrist surgery     CTR   VASCULAR SURGERY     vericose vein in right femoral area   VASECTOMY     WISDOM TOOTH EXTRACTION      Family History  Problem Relation Age of Onset   Heart attack Mother 37   Thyroid disease Mother    Heart disease Mother    Thyroid disease Sister    Thyroid disease Brother    Heart disease Maternal Grandmother    Hypertension Maternal Grandmother    Hyperlipidemia Maternal Grandmother    Heart disease Maternal Grandfather    Hypertension Maternal Grandfather    Hyperlipidemia Maternal Grandfather    Stroke Paternal Grandfather    Thyroid disease Sister    Heart disease Father        2 stents   Coronary artery disease Other        questionable in father    Social History   Socioeconomic History   Marital status: Married    Spouse name: Not on file   Number of children: 3   Years of education:  Not on file   Highest education level: Not on file  Occupational History   Not on file  Tobacco Use   Smoking status: Never   Smokeless tobacco: Never  Vaping Use   Vaping Use: Never used  Substance and Sexual Activity   Alcohol use: Yes    Comment: socially   Drug use: No   Sexual activity: Yes    Partners: Female    Comment: 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, works Transport planner, no dietary restrictions  Other Topics Concern   Not on file  Social History Narrative   Not on file   Social Determinants of Health   Financial Resource Strain: Not on file  Food Insecurity: Not on file  Transportation Needs: Not on file  Physical Activity: Not on file  Stress: Not on file  Social Connections: Not on file  Intimate Partner Violence: Not on file    Outpatient Medications Prior to  Visit  Medication Sig Dispense Refill   busPIRone (BUSPAR) 10 MG tablet TAKE 1 TABLET THREE TIMES A DAY 270 tablet 1   aspirin EC 81 MG tablet Take 81 mg by mouth daily.     atorvastatin (LIPITOR) 40 MG tablet TAKE 1 TABLET (40 MG TOTAL) BY MOUTH DAILY AT 6 PM. 90 tablet 3   BACLOFEN IT 1 Dose by Intrathecal route as directed.      cetirizine (ZYRTEC) 10 MG tablet Take 10 mg by mouth daily as needed for allergies.     clobetasol cream (TEMOVATE) 0.05 % Apply topically 2 (two) times daily.     clonazePAM (KLONOPIN) 1 MG tablet Take 1 tablet (1 mg total) by mouth 4 (four) times daily. 360 tablet 1   Clonidine HCl POWD by Does not apply route. 7100m/ml IT Baclofen. Total dose 157.884mday. Last pump refill 06/02/16     clotrimazole-betamethasone (LOTRISONE) cream Apply 1 application topically 2 (two) times daily as needed (anti-fungal). 30 g 2   cyclobenzaprine (FLEXERIL) 10 MG tablet TAKE 1 TABLET BY MOUTH THREE TIMES A DAY AS NEEDED FOR MUSCLE SPASMS 90 tablet 3   dalfampridine 10 MG TB12 TAKE 1 TABLET (10 MG TOTAL) EVERY 12 HOURS 180 tablet 3   DULoxetine (CYMBALTA) 60 MG capsule Take 1 capsule (60 mg total) by mouth daily. Brand name only 180 capsule 3   famotidine (PEPCID) 40 MG tablet TAKE 1 TABLET BY MOUTH EVERYDAY AT BEDTIME 90 tablet 3   furosemide (LASIX) 20 MG tablet 1 ta po daily x 5 days and then as needed for SOB, weight gain>3#, pedal edema and can take a second tab daily as needed 45 tablet 3   gabapentin (NEURONTIN) 600 MG tablet Take 1 tablet (600 mg total) by mouth 4 (four) times daily. 360 tablet 3   ibuprofen (ADVIL,MOTRIN) 200 MG tablet Take 200 mg by mouth every 6 (six) hours as needed for moderate pain.     ketoconazole (NIZORAL) 2 % shampoo Apply topically.     levothyroxine (SYNTHROID) 137 MCG tablet Take 1 tablet (137 mcg total) by mouth daily before breakfast. 90 tablet 1   methylphenidate (RITALIN) 10 MG tablet Take 1 tablet (10 mg total) by mouth 4 (four) times daily.  120 tablet 0   natalizumab (TYSABRI) 300 MG/15ML injection Inject 300 mg into the vein every 28 (twenty-eight) days.      nitroGLYCERIN (NITROSTAT) 0.4 MG SL tablet PLACE 1 TABLET UNDER THE TOUNGE EVERY 5 MINUTES AS NEEDED FOR CHEST PAIN (X 3 DOSES) 25 tablet 0  nystatin cream (MYCOSTATIN) Apply 1 application topically as directed. 30 g 2   olmesartan-hydrochlorothiazide (BENICAR HCT) 20-12.5 MG tablet Take 1 tablet by mouth daily. 90 tablet 1   omeprazole (PRILOSEC) 40 MG capsule Take 1 capsule (40 mg total) by mouth daily. 90 capsule 3   potassium chloride SA (KLOR-CON M20) 20 MEQ tablet TAKE 1 TABLET BY MOUTH EVERY DAY AS NEEDED WHEN TAKING A SECOND LASIX TAB ANY GIVEN DAY 90 tablet 3   sucralfate (CARAFATE) 1 g tablet TAKE 1 TABLET BY MOUTH 4 TIMES DAILY - WITH MEALS AND AT BEDTIME. TAKE TABLET OR LIQUID BUT NOT BOTH 120 tablet 1   sucralfate (CARAFATE) 1 GM/10ML suspension Take 10 mLs (1 g total) by mouth 4 (four) times daily -  with meals and at bedtime. Take liquid or tablets but not both 420 mL 3   tamsulosin (FLOMAX) 0.4 MG CAPS capsule Take 1 capsule (0.4 mg total) by mouth daily. 90 capsule 3   No facility-administered medications prior to visit.    No Known Allergies  Review of Systems  Constitutional:  Positive for malaise/fatigue. Negative for fever.  HENT:  Negative for congestion.   Eyes:  Negative for blurred vision.  Respiratory:  Negative for shortness of breath.   Cardiovascular:  Negative for chest pain, palpitations and leg swelling.  Gastrointestinal:  Negative for abdominal pain, blood in stool and nausea.  Genitourinary:  Negative for dysuria and frequency.  Musculoskeletal:  Negative for falls.  Skin:  Positive for rash.  Neurological:  Negative for dizziness, loss of consciousness and headaches.  Endo/Heme/Allergies:  Negative for environmental allergies.  Psychiatric/Behavioral:  Negative for depression. The patient is not nervous/anxious.       Objective:     Physical Exam Constitutional:      General: He is not in acute distress.    Appearance: Normal appearance. He is not ill-appearing or toxic-appearing.  HENT:     Head: Normocephalic and atraumatic.     Right Ear: External ear normal.     Left Ear: External ear normal.     Nose: Nose normal.  Eyes:     General:        Right eye: No discharge.        Left eye: No discharge.  Cardiovascular:     Rate and Rhythm: Normal rate and regular rhythm.  Pulmonary:     Effort: Pulmonary effort is normal.  Abdominal:     General: Bowel sounds are normal.  Musculoskeletal:     Comments: 1 cm dark lesion lateral, plantar surface right foot. 5 mm skin breakdown same location left foot.   Skin:    Findings: No rash.     Comments: Raised waxy brown, gray lesion in left axillae.   Neurological:     Mental Status: He is alert and oriented to person, place, and time. Mental status is at baseline.     Motor: Weakness present.     Coordination: Coordination abnormal.  Psychiatric:        Behavior: Behavior normal.    BP 108/69    Pulse (!) 108    Temp 97.8 F (36.6 C)    Resp 16    Wt (!) 306 lb 12.8 oz (139.2 kg)    SpO2 99%    BMI 38.35 kg/m  Wt Readings from Last 3 Encounters:  01/16/22 (!) 306 lb 12.8 oz (139.2 kg)  08/26/21 291 lb (132 kg)  08/20/21 296 lb 8 oz (134.5 kg)  Diabetic Foot Exam - Simple   No data filed    Lab Results  Component Value Date   WBC 7.7 01/16/2022   HGB 13.3 01/16/2022   HCT 39.9 01/16/2022   PLT 229.0 01/16/2022   GLUCOSE 104 (H) 01/16/2022   CHOL 137 01/16/2022   TRIG 125.0 01/16/2022   HDL 49.20 01/16/2022   LDLCALC 63 01/16/2022   ALT 16 01/16/2022   AST 13 01/16/2022   NA 140 01/16/2022   K 3.8 01/16/2022   CL 101 01/16/2022   CREATININE 0.92 01/16/2022   BUN 17 01/16/2022   CO2 32 01/16/2022   TSH 0.74 01/16/2022   PSA 0.52 02/11/2016   INR 1.00 04/21/2011   HGBA1C 5.8 01/16/2022    Lab Results  Component Value Date   TSH  0.74 01/16/2022   Lab Results  Component Value Date   WBC 7.7 01/16/2022   HGB 13.3 01/16/2022   HCT 39.9 01/16/2022   MCV 87.3 01/16/2022   PLT 229.0 01/16/2022   Lab Results  Component Value Date   NA 140 01/16/2022   K 3.8 01/16/2022   CO2 32 01/16/2022   GLUCOSE 104 (H) 01/16/2022   BUN 17 01/16/2022   CREATININE 0.92 01/16/2022   BILITOT 0.6 01/16/2022   ALKPHOS 76 01/16/2022   AST 13 01/16/2022   ALT 16 01/16/2022   PROT 6.6 01/16/2022   ALBUMIN 4.2 01/16/2022   CALCIUM 9.2 01/16/2022   ANIONGAP 10 09/18/2020   EGFR 105 04/02/2021   GFR 98.09 01/16/2022   Lab Results  Component Value Date   CHOL 137 01/16/2022   Lab Results  Component Value Date   HDL 49.20 01/16/2022   Lab Results  Component Value Date   LDLCALC 63 01/16/2022   Lab Results  Component Value Date   TRIG 125.0 01/16/2022   Lab Results  Component Value Date   CHOLHDL 3 01/16/2022   Lab Results  Component Value Date   HGBA1C 5.8 01/16/2022       Assessment & Plan:   Problem List Items Addressed This Visit     Hyperlipidemia - Primary    Tolerating statin, encouraged heart healthy diet, avoid trans fats, minimize simple carbs and saturated fats. Increase exercise as tolerated      Relevant Orders   Lipid panel (Completed)   Hypothyroidism    On Levothyroxine, continue to monitor      Relevant Orders   TSH (Completed)   HTN (hypertension)    Well controlled, no changes to meds. Encouraged heart healthy diet such as the DASH diet and exercise as tolerated.       Relevant Orders   CBC (Completed)   Comprehensive metabolic panel (Completed)   TSH (Completed)   T4, free (Completed)   Hyperglycemia    hgba1c acceptable, minimize simple carbs. Increase exercise as tolerated.       Relevant Orders   Hemoglobin A1c (Completed)   Pressure sore    Has sores at lateral plantar surface of both feet right more than left. He is wheelchair bound and sits for many hours with his  feet in the same position. Discussed strategies for changing the pressure points on feet during the day. Have referred to podiatry for further consideration.       Relevant Orders   Ambulatory referral to Podiatry   SK (seborrheic keratosis)    Irritated in left axillae. He has an appt with neurology next month offered reassurance that the lesion is not overly concerning  but advised evaluation by dermatology due to irritation.       I am having Marcial Pacas maintain his cetirizine, aspirin EC, ibuprofen, BACLOFEN IT, natalizumab, Clonidine HCl, clotrimazole-betamethasone, nitroGLYCERIN, nystatin cream, cyclobenzaprine, ketoconazole, dalfampridine, gabapentin, clonazePAM, omeprazole, famotidine, tamsulosin, furosemide, potassium chloride SA, DULoxetine, atorvastatin, methylphenidate, clobetasol cream, olmesartan-hydrochlorothiazide, sucralfate, busPIRone, levothyroxine, and sucralfate.  No orders of the defined types were placed in this encounter.    Penni Homans, MD

## 2022-01-23 ENCOUNTER — Ambulatory Visit (INDEPENDENT_AMBULATORY_CARE_PROVIDER_SITE_OTHER): Payer: 59 | Admitting: Psychology

## 2022-01-23 DIAGNOSIS — F331 Major depressive disorder, recurrent, moderate: Secondary | ICD-10-CM
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
## 2022-01-24 ENCOUNTER — Other Ambulatory Visit: Payer: Self-pay

## 2022-01-24 ENCOUNTER — Ambulatory Visit (INDEPENDENT_AMBULATORY_CARE_PROVIDER_SITE_OTHER): Payer: Managed Care, Other (non HMO) | Admitting: Podiatry

## 2022-01-24 ENCOUNTER — Encounter: Payer: Self-pay | Admitting: Podiatry

## 2022-01-24 DIAGNOSIS — L97511 Non-pressure chronic ulcer of other part of right foot limited to breakdown of skin: Secondary | ICD-10-CM

## 2022-01-24 DIAGNOSIS — G35 Multiple sclerosis: Secondary | ICD-10-CM | POA: Diagnosis not present

## 2022-01-24 DIAGNOSIS — L84 Corns and callosities: Secondary | ICD-10-CM

## 2022-01-24 NOTE — Progress Notes (Signed)
Subjective:  Patient ID: Keith Morrow, male    DOB: June 07, 1971,   MRN: [id number]  Chief Complaint  Patient presents with   pressure sores    Bil pressure sores near pinky toes    51 y.o. male presents for concern of pressure sores to bilateral feet particularly his fifth toes.  He is wheelchair bound with MS . These sores have been present for about 4-5 months. He has swelling and redness in both feet. Relates they only hurt when pressure is applied.  Denies any other pedal complaints. Denies n/v/f/c.   Past Medical History:  Diagnosis Date   Acute bronchitis 05/09/2013   Allergy    Bronchitis, acute 12/10/2011   Candidal skin infection 05/05/2017   Chicken pox as a child   Constipation 02/20/2017   Coronary atherosclerosis of native coronary artery    a. NSTEMI 04/20/11: tx with Promus DES to LAD; bifurcation lesion with oDx 90% tx with POBA; b. staged PCI of right post. AV Branch with Promus DES; c. 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 CFT <3 seconds. Normal hair growth on digits. No edema.  Skin. No lacerations or abrasions bilateral feet. Bilateral hyperkeratotic tissue to plantar fifth metatarsal. Upon debridement right area was found to have ulcer measurin 0.2 cm x0.1 cm x 0.1 cm with granular base and no erythema or  edema surroudnign.  Musculoskeletal: MMT 5/5 bilateral lower extremities in DF, PF, Inversion and Eversion. Deceased ROM in DF of ankle joint.  Neurological: Sensation intact to light touch.   Assessment:   1. Skin ulcer of plantar aspect of right foot, limited to breakdown of skin ([place])   2. Multiple sclerosis ([place])   3. Pre-ulcerative calluses      Plan:  Patient was evaluated and treated and all questions answered. Ulcer right plantar foot limited to breakdown of skin  -Debridement as below. -Dressed with neosporin , DSD. -Off-loading with padding and discussed keeping pressure off of these area at all times.  -No abx indicated.  -Discussed if any worsening redness, pain, fever or chills to call or may need to report to the emergency room. Patient expressed understanding.   Procedure: Excisional Debridement of Wound Rationale: Removal of non-viable soft tissue from the wound to promote healing.  Anesthesia: none Pre-Debridement Wound Measurements: Overlying callus  Post-Debridement Wound Measurements: 0.2 cm x 0.1 cm x 0.1 cm  Type of Debridement: Sharp Excisional Tissue Removed: Non-viable soft tissue Depth of Debridement: subcutaneous tissue. Technique: Sharp excisional debridement to bleeding, viable wound base.  Dressing: Dry, sterile, compression dressing. Disposition: Patient tolerated procedure well. Patient to return in 2 week for follow-up.  Return in about 2 weeks (around 02/07/2022).   Lorenda Peck, DPM

## 2022-01-26 ENCOUNTER — Encounter: Payer: Self-pay | Admitting: Neurology

## 2022-01-27 ENCOUNTER — Other Ambulatory Visit: Payer: Self-pay | Admitting: Neurology

## 2022-01-27 MED ORDER — CYCLOBENZAPRINE HCL 10 MG PO TABS: ORAL_TABLET | ORAL | 0 refills | Status: DC

## 2022-01-27 MED ORDER — METHYLPHENIDATE HCL 10 MG PO TABS: 10.0000 mg | ORAL_TABLET | Freq: Four times a day (QID) | ORAL | 0 refills | Status: DC

## 2022-01-28 NOTE — Telephone Encounter (Signed)
Took call from phone staff and spoke with Cathy[facility]. They never received faxes we sent. I confirmed fax: [phone].  Re-faxed clinicals/including [facility] ab lab result. Received fax confirmation. She will fax order for MD to sign to [place] at [phone].

## 2022-01-28 NOTE — Telephone Encounter (Signed)
Received Tysabri order. MD signed and I faxed back to [facility] at [phone]. Received fax confirmation.

## 2022-02-03 ENCOUNTER — Other Ambulatory Visit: Payer: Self-pay | Admitting: Neurology

## 2022-02-05 ENCOUNTER — Encounter: Payer: Self-pay | Admitting: Family Medicine

## 2022-02-06 ENCOUNTER — Ambulatory Visit (INDEPENDENT_AMBULATORY_CARE_PROVIDER_SITE_OTHER): Payer: 59 | Admitting: Psychology

## 2022-02-06 DIAGNOSIS — F331 Major depressive disorder, recurrent, moderate: Secondary | ICD-10-CM | POA: Diagnosis not present

## 2022-02-06 NOTE — Progress Notes (Signed)
[facility] Counselor/Therapist Progress Note ? ?Patient ID: Keith Morrow, MRN: [id number],   ? ?Date: 02/06/2022 ? ?Time Spent: 3:00pm-3:45pm   45 minutes  ? ?Treatment Type: Individual Therapy ? ?Reported Symptoms: stress ? ?Mental Status Exam: ?Appearance:  Casual     ?Behavior: Appropriate  ?Motor: Normal  ?Speech/Language:  Normal Rate  ?Affect: Appropriate  ?Mood: normal  ?Thought process: normal  ?Thought content:   WNL  ?Sensory/Perceptual disturbances:   WNL  ?Orientation: oriented to person, place, time/date, and situation  ?Attention: Good  ?Concentration: Good  ?Memory: WNL  ?Fund of knowledge:  Good  ?Insight:   Good  ?Judgment:  Good  ?Impulse Control: Good  ? ?Risk Assessment: ?Danger to Self:  No ?Self-injurious Behavior: No ?Danger to Others: No ?Duty to Warn:no ?Physical Aggression / Violence:No  ?Access to Firearms a concern: No  ?Gang Involvement:No  ? ?Subjective: Pt present for face-to-face individual therapy via video Webex.  Pt consented to telehealth video therapy due to COVID 19 pandemic.   ?Location of pt: home ?Location of therapist: home office.  ?Pt talked about feeling tired.  He just had physical therapy and worked hard.   ?Pt talked about work.   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   Pt is not sure how that will impact him.  Pt has felt frustrated that he has not gotten a promotion in 15 years.   Pt feels some anxiety about the restructuring bc so much is unknown.   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   ?Pt talked about Keith Morrow's  upcoming surgery.   They have some anxiety about the surgery which is on March 10th.   Keith Morrow will be getting a full thyroidectomy.    Addressed pt's concerns. ?Pt is working on keeping a positive attitude and not getting caught up in worry thoughts about work or Keith Morrow's surgery.   ?Provided supportive therapy.    ? ?Interventions: Cognitive Behavioral Therapy and  Insight-Oriented ? ?Diagnosis: F33.1 ? ?Plan: See pt's Treatment Plan for depression in Therapy Charts.  (Treatment Plan Target Date: 03/21/2022) ?Pt is progressing toward treatment goals.   ?Plan to continue to see pt every two weeks.   ? ?Cristen Bredeson, LCSW ? ? ? ?

## 2022-02-07 ENCOUNTER — Encounter: Payer: Self-pay | Admitting: Podiatry

## 2022-02-07 ENCOUNTER — Ambulatory Visit (INDEPENDENT_AMBULATORY_CARE_PROVIDER_SITE_OTHER): Payer: Managed Care, Other (non HMO) | Admitting: Podiatry

## 2022-02-07 ENCOUNTER — Other Ambulatory Visit: Payer: Self-pay

## 2022-02-07 DIAGNOSIS — L84 Corns and callosities: Secondary | ICD-10-CM

## 2022-02-07 DIAGNOSIS — G35 Multiple sclerosis: Secondary | ICD-10-CM

## 2022-02-07 NOTE — Progress Notes (Signed)
?  Subjective:  ?Patient ID: Keith Morrow, male    DOB: 1971/02/02,   MRN: [id number] ? ?No chief complaint on file. ? ? ?51 y.o. male presents for  for follow-up of pressure sores to bilateral feet .  He is wheelchair bound with MS . Relates the areas are doing much better. Relates they only hurt when pressure is applied.  Denies any other pedal complaints. Denies n/v/f/c.  ? ?Past Medical History:  ?Diagnosis Date  ? Acute bronchitis 05/09/2013  ? Allergy   ? Bronchitis, acute 12/10/2011  ? Candidal skin infection 05/05/2017  ? Chicken pox as a child  ? Constipation 02/20/2017  ? Coronary atherosclerosis of native coronary artery   ? a. NSTEMI 04/20/11: tx with Promus DES to LAD; bifurcation lesion with oDx 90% tx with POBA; b. staged PCI of right post. AV Branch with Promus DES; c. residual at cath 04/21/11:  AV groove CFX 50%, mRCA 30%,; EF 55%  ? Depression   ? Depression with anxiety   ? Dermatitis, contact 12/10/2011  ? Fatigue 10/15/2012  ? Heart attack ([facility]) 04-20-11  ? HTN (hypertension)   ? Hyperlipidemia   ? Hypotestosteronism 11/17/2011  ? Hypothyroidism   ? Keloid scar   ? MS (multiple sclerosis) ([facility])   ? Neuromuscular disorder ([facility])   ? NUMBNESS/TINGLING  ? Obesity   ? Onychomycosis   ? Preventative health care 10/07/2011  ? Reflux 10/07/2011  ? Sleep apnea   ? Sun-damaged skin 02/17/2016  ? Testosterone deficiency 01/16/2012  ? Tinea pedis   ? 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 Visual changes 11/13/2016  ? ? ?Objective:  ?Physical Exam: ?Vascular: DP/PT pulses 2/4 bilateral. CFT <3 seconds. Normal hair growth on digits. No edema.  ?Skin. No lacerations or abrasions bilateral feet. Bilateral hyperkeratotic tissue to plantar fifth metatarsal. Upon debridement right area  healed.  ?Musculoskeletal: MMT 5/5 bilateral lower extremities in DF, PF, Inversion and Eversion. Deceased ROM in DF of ankle joint.  ?Neurological: Sensation intact to light touch.  ? ?Assessment:  ? ?1. Pre-ulcerative calluses   ?2.  Multiple sclerosis ([facility])   ? ? ? ? ?Plan:  ?Patient was evaluated and treated and all questions answered. ?Ulcer right plantar foot limited to breakdown of skin- healed   ?-Debridement of hyperkeratotic tissue without incident and underlying ulcer has now healed.  ?-Off-loading with padding and discussed keeping pressure off of these area at all times.  ?-Will schedule with Arlys John to discuss custom inserts to keep these areas offloaded.  ?-No abx indicated.  ?-Discussed if any worsening redness, pain, fever or chills to call or may need to report to the emergency room. Patient expressed understanding.  ?Follow-up in 3 months for foot check and routine care.  ? ? ?No follow-ups on file. ? ? ?Louann Sjogren, DPM  ? ? ?

## 2022-02-09 NOTE — Progress Notes (Deleted)
02/10/22- 50 yoM never smoker for sleep evaluation self-referred with concern of OSA ?Medical problem list includes OSA, Insomnia, CAD/MI, HTN, GERD, Hypothyroid, Multiple Sclerosis, Spastic Diplegia, Covid infection, Depression, Hyperlipidemia,  ?HST 09/14/19- AHI 28.2/ hr, desaturation to 78%, body weight 276 lbs ?CPAP auto 5-17/ [employer] ordered 11/30/19 ?Download- ?Epworth score ?Body weight today- ?-Ritalin 10 qid ?Covid vax- ?Flu vax- ? ?

## 2022-02-10 ENCOUNTER — Institutional Professional Consult (permissible substitution): Payer: Managed Care, Other (non HMO) | Admitting: Internal Medicine

## 2022-02-20 ENCOUNTER — Other Ambulatory Visit: Payer: Self-pay

## 2022-02-20 ENCOUNTER — Ambulatory Visit (INDEPENDENT_AMBULATORY_CARE_PROVIDER_SITE_OTHER): Payer: Managed Care, Other (non HMO)

## 2022-02-20 ENCOUNTER — Ambulatory Visit (INDEPENDENT_AMBULATORY_CARE_PROVIDER_SITE_OTHER): Payer: 59 | Admitting: Psychology

## 2022-02-20 DIAGNOSIS — F331 Major depressive disorder, recurrent, moderate: Secondary | ICD-10-CM

## 2022-02-20 DIAGNOSIS — L97511 Non-pressure chronic ulcer of other part of right foot limited to breakdown of skin: Secondary | ICD-10-CM | POA: Diagnosis not present

## 2022-02-20 DIAGNOSIS — L84 Corns and callosities: Secondary | ICD-10-CM | POA: Diagnosis not present

## 2022-02-20 DIAGNOSIS — L97521 Non-pressure chronic ulcer of other part of left foot limited to breakdown of skin: Secondary | ICD-10-CM | POA: Diagnosis not present

## 2022-02-20 NOTE — Progress Notes (Signed)
SITUATION ?Reason for Consult: Evaluation for Bilateral Custom Foot Orthoses ?Patient / Caregiver Report: Patient is ready for foot orthotics ? ?OBJECTIVE DATA: ?Patient History / Diagnosis:  ?  ICD-10-CM   ?1. Skin ulcer of plantar aspect of right foot, limited to breakdown of skin ([place])  L97.511   ?  ?2. Skin ulcer of left great toe, limited to breakdown of skin ([place])  L97.521   ?  ?3. Pre-ulcerative calluses  L84   ?  ? ? ?Current or Previous Devices: Historical user ? ?Foot Examination: ?Skin presentation:   Intact ?Ulcers & Callousing:   None and no history ?Toe / Foot Deformities:  Pes planus ?Weight Bearing Presentation:  Planus ?Sensation:    Intact ? ?Shoe Size: 13 ? ?ORTHOTIC RECOMMENDATION ?Recommended Device: 1x pair of custom functional foot orthotics ? ?GOALS OF ORTHOSES ?- Reduce Pain ?- Prevent Foot Deformity ?- Prevent Progression of Further Foot Deformity ?- Relieve Pressure ?- Improve the Overall Biomechanical Function of the Foot and Lower Extremity. ? ?ACTIONS PERFORMED ?Patient was casted for Foot Orthoses via crush box. Procedure was explained and patient tolerated procedure well. All questions were answered and concerns addressed. ? ?PLAN ?Potential out of pocket cost was communicated to patient. Casts are to be sent to [facility] for fabrication. Patient is to be called for fitting when devices are ready.  ? ? ?

## 2022-02-20 NOTE — Progress Notes (Signed)
[facility] Counselor/Therapist Progress Note ? ?Patient ID: Keith Morrow, MRN: [id number],   ? ?Date: 02/20/2022 ? ?Time Spent: 11:00am - 11:45am   45 minutes  ? ?Treatment Type: Individual Therapy ? ?Reported Symptoms: stress ? ?Mental Status Exam: ?Appearance:  Casual     ?Behavior: Appropriate  ?Motor: Normal  ?Speech/Language:  Normal Rate  ?Affect: Appropriate  ?Mood: normal  ?Thought process: normal  ?Thought content:   WNL  ?Sensory/Perceptual disturbances:   WNL  ?Orientation: oriented to person, place, time/date, and situation  ?Attention: Good  ?Concentration: Good  ?Memory: WNL  ?Fund of knowledge:  Good  ?Insight:   Good  ?Judgment:  Good  ?Impulse Control: Good  ? ?Risk Assessment: ?Danger to Self:  No ?Self-injurious Behavior: No ?Danger to Others: No ?Duty to Warn:no ?Physical Aggression / Violence:No  ?Access to Firearms a concern: No  ?Gang Involvement:No  ? ?Subjective: Pt present for face-to-face individual therapy via video Webex.  Pt consented to telehealth video therapy due to COVID 19 pandemic.   ?Location of pt: home ?Location of therapist: home office.  ?Pt talked about work.  He is in the office today and is glad to be able to be in an environment other than home.   They are having important meetings that he is involved in.  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   Pt goes to the office about once a week but will continue to work primarily from home.   ?Pt talked about [name] having her surgery last week.  Pt states the surgery went well.  The growth was removed.  They removed the entire thyroid.  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   They were able to get all of the cancer so she will not need chemo or any treatment.   Pt states the surgery and results were scary for him.  He is relieved that they got all of the cancer.   Keith Morrow is recovering but is in a lot of pain and has had trouble sleeping.  Addressed pt's concerns  about Keith Morrow.   ?Pt has Keith Morrow and Keith Morrow at home this week.  They have been somewhat self centered and not as helpful with things around the house.  Addressed how pt can communicate his needs to his family.   ?Provided supportive therapy.    ? ?Interventions: Cognitive Behavioral Therapy and Insight-Oriented ? ?Diagnosis: F33.1 ? ?Plan: See pt's Treatment Plan for depression in Therapy Charts.  (Treatment Plan Target Date: 03/21/2022) ?Pt is progressing toward treatment goals.   ?Plan to continue to see pt every two weeks.   ? ?Rosibel Giacobbe, LCSW ? ? ? ?

## 2022-02-21 ENCOUNTER — Other Ambulatory Visit: Payer: Self-pay | Admitting: Family Medicine

## 2022-02-21 ENCOUNTER — Encounter: Payer: Self-pay | Admitting: Family Medicine

## 2022-02-21 MED ORDER — DULOXETINE HCL 60 MG PO CPEP: 60.0000 mg | ORAL_CAPSULE | Freq: Every day | ORAL | 1 refills | Status: DC

## 2022-02-24 ENCOUNTER — Other Ambulatory Visit: Payer: Self-pay

## 2022-02-24 DIAGNOSIS — E039 Hypothyroidism, unspecified: Secondary | ICD-10-CM

## 2022-02-24 MED ORDER — LEVOTHYROXINE SODIUM 137 MCG PO TABS: 150.0000 ug | ORAL_TABLET | Freq: Every day | ORAL | 1 refills | Status: DC

## 2022-02-25 ENCOUNTER — Encounter: Payer: Self-pay | Admitting: Neurology

## 2022-02-25 ENCOUNTER — Telehealth: Payer: Self-pay | Admitting: Neurology

## 2022-02-25 ENCOUNTER — Ambulatory Visit (INDEPENDENT_AMBULATORY_CARE_PROVIDER_SITE_OTHER): Payer: Managed Care, Other (non HMO) | Admitting: Neurology

## 2022-02-25 VITALS — BP 94/59 | HR 84 | Ht 75.0 in

## 2022-02-25 DIAGNOSIS — Z79899 Other long term (current) drug therapy: Secondary | ICD-10-CM

## 2022-02-25 DIAGNOSIS — G801 Spastic diplegic cerebral palsy: Secondary | ICD-10-CM | POA: Diagnosis not present

## 2022-02-25 DIAGNOSIS — R269 Unspecified abnormalities of gait and mobility: Secondary | ICD-10-CM

## 2022-02-25 DIAGNOSIS — G35 Multiple sclerosis: Secondary | ICD-10-CM | POA: Diagnosis not present

## 2022-02-25 DIAGNOSIS — Z95828 Presence of other vascular implants and grafts: Secondary | ICD-10-CM

## 2022-02-25 DIAGNOSIS — R3915 Urgency of urination: Secondary | ICD-10-CM

## 2022-02-25 MED ORDER — LAMOTRIGINE 100 MG PO TABS: 100.0000 mg | ORAL_TABLET | Freq: Two times a day (BID) | ORAL | 5 refills | Status: DC

## 2022-02-25 NOTE — Telephone Encounter (Signed)
Placed JCV lab in [facility] lock box for routine lab pick up. Results pending. 

## 2022-02-25 NOTE — Progress Notes (Signed)
The baclofen pump was reprogrammed.  The simple continuous rate was increased by 10% from to 430.4 mcg/day to 469.46mcg/day of baclofen, 100.42 mcg/day to 109.81mcg/day of clonidine. ?  ?The bolus schedule was reprogrammed to reflect the previous settings.  The patient will receive a 29.9 mcg bolus of baclofen, 6.99 mcg of clonidine with a total of up to 4 activations per day.  Lockout interval of 2 hours.  Duration of bolus is 5 minutes. Dose restriction interval: 4/24hr.  ? ?Faxed copy of updated session report to [facility] at [phone]. Received fax confirmation. ? ? ? ? ? ? ?

## 2022-02-25 NOTE — Patient Instructions (Signed)
The pharmacy has the prescription for lamotrigine 100 mg tablets. For 5 days, just take one half pill a day. For the next 5 days, take one half pill twice a day. For the next 5 days, take one half pill 3 times a day Then start taking one pill twice a day from this point on.      If you get a rash, need to stop the medication and not take it again. 

## 2022-02-25 NOTE — Progress Notes (Addendum)
? ?  ?HPI   ? ? Follow-up   ? Additional comments: RM 8. Last seen 08/20/21. Needs new WC via [employer]. Here for [facility] evaluation. Feels more stiff. Wondering if baclofen pump can be adjusted? More stiff in right hand. Having some cramping. Having to use bolus more often now.  ?No vision changes.  ? ?  ?  ?Last edited by Arther Abbott, RN on 02/25/2022  3:19 PM.  ?  ?   ?Chief Complaint   ?Follow-up ?  ? ? ?HISTORY:  ?Keith Morrow is a 51 y.o. man with a relapsing form of multiple sclerosis who has severe bilateral leg weakness and severe mobility issues that affect his activities of daily living..   ? ?Update 02/25/2022: ?Mobility evaluation: ?Current Mobility:  He is currently using an electric wheelchair.   He has been unable to take any steps with a walker for > 3 years.   He is able to transfer from his wheelchair to the toilet or a chir/bed using transfer bars but sometimes needs help with the transfers, especially if he has just transferred once a few minutes earlier.    His current wheelchair is > 87 years old and is having mechanical problems.    ? ?ADLs:   Due to bilateral leg weakness, he has trouble with many ADL's such as simple chores, meal prep, bathroom,.   The current chair has a lift feature that allows him to get into the cupboard.    Arms are just slightly weak and he has no trouble with the hand controls.   He is still doing PT.    ? ?Mobility needs: ?He is unable to use a cane or walker due to his severe leg weakness.  A manual wheelchair would not allow him to do activities of daily living due to the arm weakness and fatigue.  Extensive majority of time in his current power wheelchair.  The scooter would not allow him to perform the activities of daily living due to difficulties getting on and off, the tiller interfering with ability to do simple chores and the large turning radius.  Therefore, he needs a power wheelchair. ? ?He needs recline, leg lift, seat elevation features as he spends the  majority of the day in his chair.    Due to to leg edema, he needs the recline and the leg lift/extension features.   Due to his need to get into cabinets and to adjust to counters/tables, he needs the seat elevation feature.    He is right handed and controls should be on his right.   He has the ability to safely operate a power wheelchair and has the motivation to do so.    ? ?MS:  He is on Tysabri and tolerates it well.   No exacerbations.    He is noting more rigidity in his legs and has more spasticity   He is using the self administered boluses more.     He has noted slow progression of weakness and reduced coordination in the right arm/hand.     Sensation is generally the same.     Vision is fine.   Bladder function is fine on tamsulosin.  No UTI.      He sleeps well at night.  He sleeps better with CPAP.  We discussed different types of masks.  Mood is doing reasonably well.      He has a new baclofen pump (late 2021) that allow patient controlled boluses.   Because  he is feeling weaker, he is trying to use fewer boluses.  Night time spasticity is doing ok.   He also takes clonazepam at night.   [facility] fills the pump.   The spasticity is generally worse when he first wakes up and late afternoon/early evening.    ? ?He is still working with the [employer] (works from home).     ? ? ?MS history:  ?In 1997, he had left optic neuritis and then had numbness in both feet. MRI was consistent with MS and a lumbar puncture was also performed and CSF was consistent with MS. Initially, he was placed on Copaxone. He had difficulties with compliance and 5 years later switched to Avonex. Unfortunate, he had difficulties tolerating Avonex and also had difficulty tolerating Rebif. In 2004, he started Tysabri but was only able to take 2 doses before was taken off the market. He restarted in 2005 and has been on Tysabri since then with the exception of a 6 month holiday. Tolerates Tysabri well. He has not had  any definite exacerbations though his gait has worsened over the past few years. He is JCV antibody negative.   He had recent MRI of the brain and spine and there were no changes.     ? ?Spasticity history: He was experiencing progressive spasticity in both legs.  He got a baclofen pump in 2013.  Prior to the pump he was having severe tonic spasticity as well as frequent phasic spasms.  These improved.  Due to the spasticity, baclofen was increased in 2016 and 2017 but he became weaker.  I began to see him again and we added clonidine to the baclofen and cut his dose and added nighttime boluses.  He did better with those settings. ? ?Imaging: ?MRI of the cervical spine dated 07/25/2020 and  MRI of the cervical spine from 07/05/2015.  Both showed multiple T2 hyperintense foci within the spinal cord.  They are located posterolaterally to the right below the cervicomedullary junction, anteriorly adjacent to C3C4,  posterolaterally to the left adjacent to C4, posterior adjacent to C4-C5, both the left and the right adjacent to C4-C5, very large right focus adjacent to C5, posterior laterally to the left adjacent to C6, towards the right adjacent to C6 and posterior laterally to the right adjacent to C7-T1.  None of the foci enhance.  There did not appear to be any new lesions between 2016 and 2021.   There is a right disc protrusion causing mild to moderate right foraminal narrowing.  At C5-C6, there is a right disc protrusion but no nerve root compression ? ?The MRI of the brain from 07/25/2020 is unchanged compared to the 12/19/2015 MRI according to the official report.  Most foci are supratentorial but somewhat noted in the pons.    There are no enhancing lesions.  ? ?PHYSICAL EXAM ? ?General: The patient is well-developed and well-nourished and in no acute distress.  He is in a power wheelchair. ? ?Extremities:   There are no rashes or edema.   ? ?Neurologic Exam ? ?Mental status: The patient is alert and oriented x 3  at the time of the examination.  Good focus/attention.    Speech is normal. ? ?CN:   Extraocular muscles are normal.  No ptosis.    Symmetric facial strength and sensation.    PE/TP midline ? ?Motor:  Muscle bulk is normal.  tone is increased in the right arm and minimally increased in the legs.  Strength  is 4/5 in the right arm, 4+/5 in the left arm, 1 to 2-/5 in the right leg and 2- to 2/5 in the left leg .   He has reduced RAM in right hand ? ?Sensation: He had symmetric sensation to touch and vibration today. ? ?Gait and station: He is wheelchair-bound ? ?Reflexes: Deep tendon reflexes are trace at the left knee and trace at the right knee.   No ankle clonus ? ? ?ASSESSMENT ? ?Multiple sclerosis (HCC) - Plan: CBC with Differential/Platelet, Stratify JCV Ab (w/ Index) w/ Rflx, CANCELED: Stratify JCV(TM) Ab w/Index ? ?Gait disturbance ? ?High risk medication use - Plan: CBC with Differential/Platelet, Stratify JCV Ab (w/ Index) w/ Rflx, CANCELED: Stratify JCV(TM) Ab w/Index ? ?Spastic diplegia (HCC) ? ?Presence of implanted infusion pump ? ?Urinary urgency ? ? ?PLAN: ?1.  He requires a power wheelchair with reclining, leg extension and seat elevation features (see details above) hand controls to be on the right.  He is able to safely operate this type of power wheelchair and is motivated to do so.  We will Refer patient to PT Mobility/Seating Evaluation for Power Wheelchair.  ?2.   Continue the intrathecal pump.  Due to more spasticity we will increase the basal rate by 10%.   ?3.   Continue Tysabri every 4 weeks.   Check JCV antibody  ?3.   Continue other med's.  Lamotrigine for dysesthesias. ?4.   Return in 6 months or sooner if there are new or worsening neurologic issues.  We can make further adjustments if needed before the next refill ? ?40-minute office visit with the majority of the time spent face-to-face for history and physical, discussion/counseling and decision-making.  Additional time with record  review and documentation. ? ?Monzerrath Mcburney A. Epimenio Foot, MD, PhD, [place] ?Certified in Neurology, Clinical Neurophysiology, Sleep Medicine, Pain Medicine and Neuroimaging ?Director, [facility] at [facility]

## 2022-02-26 ENCOUNTER — Other Ambulatory Visit: Payer: Self-pay | Admitting: *Deleted

## 2022-02-26 ENCOUNTER — Encounter: Payer: Self-pay | Admitting: *Deleted

## 2022-02-26 DIAGNOSIS — G35 Multiple sclerosis: Secondary | ICD-10-CM

## 2022-02-26 DIAGNOSIS — R269 Unspecified abnormalities of gait and mobility: Secondary | ICD-10-CM

## 2022-02-26 DIAGNOSIS — G801 Spastic diplegic cerebral palsy: Secondary | ICD-10-CM

## 2022-02-26 LAB — CBC WITH DIFFERENTIAL/PLATELET
Basophils Absolute: 0.1 10*3/uL (ref 0.0–0.2)
Basos: 1 %
EOS (ABSOLUTE): 0.6 10*3/uL — ABNORMAL HIGH (ref 0.0–0.4)
Eos: 8 %
Hematocrit: 40.5 % (ref 37.5–51.0)
Hemoglobin: 13.5 g/dL (ref 13.0–17.7)
Immature Grans (Abs): 0 10*3/uL (ref 0.0–0.1)
Immature Granulocytes: 1 %
Lymphocytes Absolute: 2.1 10*3/uL (ref 0.7–3.1)
Lymphs: 30 %
MCH: 28.8 pg (ref 26.6–33.0)
MCHC: 33.3 g/dL (ref 31.5–35.7)
MCV: 86 fL (ref 79–97)
Monocytes Absolute: 0.6 10*3/uL (ref 0.1–0.9)
Monocytes: 9 %
Neutrophils Absolute: 3.7 10*3/uL (ref 1.4–7.0)
Neutrophils: 51 %
Platelets: 212 10*3/uL (ref 150–450)
RBC: 4.69 x10E6/uL (ref 4.14–5.80)
RDW: 14.2 % (ref 11.6–15.4)
WBC: 7 10*3/uL (ref 3.4–10.8)

## 2022-02-27 ENCOUNTER — Other Ambulatory Visit: Payer: Self-pay | Admitting: Neurology

## 2022-03-03 ENCOUNTER — Encounter: Payer: Self-pay | Admitting: Adult Health

## 2022-03-03 ENCOUNTER — Ambulatory Visit (INDEPENDENT_AMBULATORY_CARE_PROVIDER_SITE_OTHER): Payer: Managed Care, Other (non HMO) | Admitting: Adult Health

## 2022-03-03 ENCOUNTER — Other Ambulatory Visit: Payer: Self-pay

## 2022-03-03 VITALS — BP 110/78 | HR 88 | Temp 98.2°F | Ht 75.0 in | Wt 310.0 lb

## 2022-03-03 DIAGNOSIS — G35 Multiple sclerosis: Secondary | ICD-10-CM

## 2022-03-03 DIAGNOSIS — G4710 Hypersomnia, unspecified: Secondary | ICD-10-CM

## 2022-03-03 DIAGNOSIS — E6609 Other obesity due to excess calories: Secondary | ICD-10-CM | POA: Diagnosis not present

## 2022-03-03 DIAGNOSIS — G4733 Obstructive sleep apnea (adult) (pediatric): Secondary | ICD-10-CM | POA: Diagnosis not present

## 2022-03-03 DIAGNOSIS — Z9989 Dependence on other enabling machines and devices: Secondary | ICD-10-CM

## 2022-03-03 NOTE — Progress Notes (Signed)
Reviewed and agree with assessment/plan. ? ? ?Coralyn Helling, MD ?[facility] ?03/03/2022, 12:12 PM ?Pager:  [phone] ? ?

## 2022-03-03 NOTE — Patient Instructions (Addendum)
Continue on CPAP at bedtime ?Change CPAP pressure to 8 to 17cm H2O ?CPAP download in 1 month  ?Order for new supplies with dreamwear nasal large mask and headgear.  ?Work on healthy weight loss ?Do not drive if sleepy ?Healthy sleep regimen ?Use sedating medications with caution ?Follow-up with Dr. Craige Cotta in 1 year and As needed     ? ?

## 2022-03-03 NOTE — Assessment & Plan Note (Signed)
BMI 38 healthy weight loss discussed ?

## 2022-03-03 NOTE — Progress Notes (Signed)
? ?@Patient  ID: , male    DOB: 07-13-1971, 51 y.o.   MRN: [id number] ? ?Chief Complaint  ?Patient presents with  ? Follow-up  ? ? ?Referring provider: ?[id number], MD ? ?HPI: ?51 year old male followed for obstructive sleep apnea ?Medical history significant for multiple sclerosis followed by neurology (wheelchair bound) ? ?TEST/EVENTS :  ?HST 09/14/19 >> AHI 28.2, SpO2 low 78%. ? ?03/03/2022 Follow up ; OSA  ?Patient returns for follow-up visit.  Last seen December 2020.  Patient has underlying moderate to severe sleep apnea.  Last visit patient had excellent compliance with his CPAP with few residuals . CPAP pressures were changed to 5 to 17 cm H2O.     Since last visit patient says doing well with CPAP . Feels he benefits from CPAP .  ?Patient has underlying MS. January 2021 bound. Can stand and pivot . Works [employer] transport manager.  ?Has daytime sleepiness , on Ritalin 2-4 tabs daily . Since starting CPAP uses less Ritalin and does not nap. ?Has several sedating medications including Lamictal, Cymbalta, Flexeril, BuSpar, Klonopin, Neurontin. ?Has Changed to dream wear nasal , really likes it. Wants to order a large mask and headgear, currently has a medium mask.  ?Feels CPAP has made a big difference in quality of life.  ?CPAP download shows excellent compliance , 100%, avg usage 9 hr. , on Auto CPAP at 5-17cmH2O, AHI: 11.6/hr , daily avg pressure 9.7cmH2O.  ? ? ? ?No Known Allergies ? ?Immunization History  ?Administered Date(s) Administered  ? Influenza Split 10/06/2011, 10/15/2012  ? Influenza,inj,Quad PF,6+ Mos 11/01/2013, 01/05/2015, 09/03/2015, 11/13/2016, 08/14/2017, 10/01/2018, 09/01/2019, 08/26/2021  ? PFIZER(Purple Top)SARS-COV-2 Vaccination 02/17/2020, 03/09/2020, 10/08/2020  ? Tdap 12/08/2006, 08/14/2017  ? ? ?Past Medical History:  ?Diagnosis Date  ? Acute bronchitis 05/09/2013  ? Allergy   ? Bronchitis, acute 12/10/2011  ? Candidal skin infection 05/05/2017  ? Chicken pox as a child  ?  Constipation 02/20/2017  ? Coronary atherosclerosis of native coronary artery   ? a. NSTEMI 04/20/11: tx with Promus DES to LAD; bifurcation lesion with oDx 90% tx with POBA; b. staged PCI of right post. AV Branch with Promus DES; c. residual at cath 04/21/11:  AV groove CFX 50%, mRCA 30%,; EF 55%  ? Depression   ? Depression with anxiety   ? Dermatitis, contact 12/10/2011  ? Fatigue 10/15/2012  ? Heart attack (HCC) 04-20-11  ? HTN (hypertension)   ? Hyperlipidemia   ? Hypotestosteronism 11/17/2011  ? Hypothyroidism   ? Keloid scar   ? MS (multiple sclerosis) (HCC)   ? Neuromuscular disorder (HCC)   ? NUMBNESS/TINGLING  ? Obesity   ? Onychomycosis   ? Preventative health care 10/07/2011  ? Reflux 10/07/2011  ? Sleep apnea   ? Sun-damaged skin 02/17/2016  ? Testosterone deficiency 01/16/2012  ? Tinea pedis   ? 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 Visual changes 11/13/2016  ? ? ?Tobacco History: ?Social History  ? ?Tobacco Use  ?Smoking Status Never  ?Smokeless Tobacco Never  ? ?Counseling given: Not Answered ? ? ?Outpatient Medications Prior to Visit  ?Medication Sig Dispense Refill  ? atorvastatin (LIPITOR) 40 MG tablet TAKE 1 TABLET (40 MG TOTAL) BY MOUTH DAILY AT 6 PM. 90 tablet 3  ? BACLOFEN IT 1 Dose by Intrathecal route as directed.     ? busPIRone (BUSPAR) 10 MG tablet TAKE 1 TABLET THREE TIMES A DAY 270 tablet 1  ? cetirizine (ZYRTEC) 10 MG tablet Take 10  mg by mouth daily as needed for allergies.    ? clonazePAM (KLONOPIN) 1 MG tablet Take 1 tablet (1 mg total) by mouth 4 (four) times daily. 360 tablet 1  ? Clonidine HCl POWD by Does not apply route. 720mg /ml IT Baclofen. Total dose 157.89mg /day. Last pump refill 06/02/16    ? clotrimazole-betamethasone (LOTRISONE) cream Apply 1 application topically 2 (two) times daily as needed (anti-fungal). 30 g 2  ? cyclobenzaprine (FLEXERIL) 10 MG tablet TAKE 1 TABLET BY MOUTH THREE TIMES A DAY AS NEEDED FOR MUSCLE SPASMS 90 tablet 0  ? DULoxetine (CYMBALTA) 60 MG capsule  Take 1 capsule (60 mg total) by mouth daily. Brand name only 180 capsule 1  ? famotidine (PEPCID) 40 MG tablet TAKE 1 TABLET BY MOUTH EVERYDAY AT BEDTIME 90 tablet 3  ? furosemide (LASIX) 20 MG tablet 1 ta po daily x 5 days and then as needed for SOB, weight gain>3#, pedal edema and can take a second tab daily as needed 45 tablet 3  ? gabapentin (NEURONTIN) 600 MG tablet Take 1 tablet (600 mg total) by mouth 4 (four) times daily. 360 tablet 3  ? ketoconazole (NIZORAL) 2 % shampoo Apply topically.    ? levothyroxine (SYNTHROID) 137 MCG tablet Take 1 tablet (137 mcg total) by mouth daily before breakfast. 90 tablet 1  ? methylphenidate (RITALIN) 10 MG tablet Take 1 tablet (10 mg total) by mouth 4 (four) times daily. 120 tablet 0  ? natalizumab (TYSABRI) 300 MG/15ML injection Inject 300 mg into the vein every 28 (twenty-eight) days.     ? nitroGLYCERIN (NITROSTAT) 0.4 MG SL tablet PLACE 1 TABLET UNDER THE TOUNGE EVERY 5 MINUTES AS NEEDED FOR CHEST PAIN (X 3 DOSES) 25 tablet 0  ? nystatin cream (MYCOSTATIN) Apply 1 application topically as directed. 30 g 2  ? olmesartan-hydrochlorothiazide (BENICAR HCT) 20-12.5 MG tablet TAKE 1 TABLET DAILY 90 tablet 3  ? omeprazole (PRILOSEC) 40 MG capsule Take 1 capsule (40 mg total) by mouth daily. 90 capsule 3  ? potassium chloride SA (KLOR-CON M20) 20 MEQ tablet TAKE 1 TABLET BY MOUTH EVERY DAY AS NEEDED WHEN TAKING A SECOND LASIX TAB ANY GIVEN DAY 90 tablet 3  ? sucralfate (CARAFATE) 1 g tablet TAKE 1 TABLET BY MOUTH 4 TIMES DAILY - WITH MEALS AND AT BEDTIME. TAKE TABLET OR LIQUID BUT NOT BOTH 120 tablet 1  ? sucralfate (CARAFATE) 1 GM/10ML suspension Take 10 mLs (1 g total) by mouth 4 (four) times daily -  with meals and at bedtime. Take liquid or tablets but not both 420 mL 3  ? tamsulosin (FLOMAX) 0.4 MG CAPS capsule Take 1 capsule (0.4 mg total) by mouth daily. 90 capsule 3  ? aspirin EC 81 MG tablet Take 81 mg by mouth daily. (Patient not taking: Reported on 03/03/2022)    ?  clobetasol cream (TEMOVATE) 0.05 % Apply topically 2 (two) times daily as needed.    ? dalfampridine 10 MG TB12 TAKE 1 TABLET EVERY 12 HOURS 60 tablet 1  ? ibuprofen (ADVIL,MOTRIN) 200 MG tablet Take 200 mg by mouth every 6 (six) hours as needed for moderate pain.    ? lamoTRIgine (LAMICTAL) 100 MG tablet Take 1 tablet (100 mg total) by mouth 2 (two) times daily. (Patient not taking: Reported on 03/03/2022) 60 tablet 5  ? ?No facility-administered medications prior to visit.  ? ? ? ?Review of Systems:  ? ?Constitutional:   No  weight loss, night sweats,  Fevers, chills,  ?+fatigue,  or  lassitude. ? ?HEENT:   No headaches,  Difficulty swallowing,  Tooth/dental problems, or  Sore throat,  ?              No sneezing, itching, ear ache, nasal congestion, post nasal drip,  ? ?CV:  No chest pain,  Orthopnea, PND, +swelling in lower extremities,  ?+anasarca, dizziness, palpitations, syncope.  ? ?GI  No heartburn, indigestion, abdominal pain, nausea, vomiting, diarrhea, change in bowel habits, loss of appetite, bloody stools.  ? ?Resp: No shortness of breath with exertion or at rest.  No excess mucus, no productive cough,  No non-productive cough,  No coughing up of blood.  No change in color of mucus.  No wheezing.  No chest wall deformity ? ?Skin: no rash or lesions. ? ?GU: no dysuria, change in color of urine, no urgency or frequency.  No flank pain, no hematuria  ? ?MS:  + wheelchair bound, MS  ? ? ? ?Physical Exam ? ?BP 110/78 (BP Location: Right Arm, Patient Position: Sitting, Cuff Size: Large)   Pulse 88   Temp 98.2 ?F (36.8 ?C) (Oral)   Ht 6\' 3"  (1.905 m)   Wt (!) 310 lb (140.6 kg)   SpO2 97%   BMI 38.75 kg/m?  ? ?GEN: A/Ox3; pleasant , NAD, well nourished  ?  ?HEENT:  [place]/AT,  EACs-clear, TMs-wnl, NOSE-clear, THROAT-clear, no lesions, no postnasal drip or exudate noted. Class 2 MP airway  ? ?NECK:  Supple w/ fair ROM; no JVD; normal carotid impulses w/o bruits; no thyromegaly or nodules palpated; no  lymphadenopathy.   ? ?RESP  Clear  P & A; w/o, wheezes/ rales/ or rhonchi. no accessory muscle use, no dullness to percussion ? ?CARD:  RRR, no m/r/g, tr  peripheral edema, pulses intact, no cyanosis or clubbing. ? ?GI:   So

## 2022-03-03 NOTE — Assessment & Plan Note (Addendum)
Excellent compliance on nocturnal CPAP.  Patient continues to have a few residual events.  Clinically has significant improvement in quality of life with decreased daytime sleepiness. ?We will adjust CPAP pressure slightly to 8 to 17 cm H2O.  To see if we can decrease some of his residuals. ?We did discuss setting up for CPAP titration study however patient is feeling well do not see any indication for this at this time. ?Patient does have underlying MS with multiple sedating medications which are most likely contributing to his underlying residuals ? ?Plan  ?Patient Instructions  ?Continue on CPAP at bedtime ?Change CPAP pressure to 8 to 17cm H2O ?CPAP download in 1 month  ?Order for new supplies with dreamwear nasal large mask and headgear.  ?Work on healthy weight loss ?Do not drive if sleepy ?Healthy sleep regimen ?Use sedating medications with caution ?Follow-up with Dr. Halford Chessman in 1 year and As needed     ? ? ] ? ?

## 2022-03-03 NOTE — Assessment & Plan Note (Signed)
Patient appears to be clinically stable.  Remains wheelchair-bound. ?Continue to follow-up with neurology. ?

## 2022-03-03 NOTE — Assessment & Plan Note (Addendum)
Patient with OSA with ongoing hypersomnolence.  Doing well on CPAP and daily regimen.  Has been able to decrease his Ritalin dose since starting on CPAP. ?Patient education given. ? ?Plan  ?Patient Instructions  ?Continue on CPAP at bedtime ?Change CPAP pressure to 8 to 17cm H2O ?CPAP download in 1 month  ?Order for new supplies with dreamwear nasal large mask and headgear.  ?Work on healthy weight loss ?Do not drive if sleepy ?Healthy sleep regimen ?Use sedating medications with caution ?Follow-up with Dr. Craige Cotta in 1 year and As needed     ? ? ? ?

## 2022-03-06 NOTE — Telephone Encounter (Signed)
JCV drawn on 02/25/22 negative, index: 0.14 ?

## 2022-03-11 ENCOUNTER — Other Ambulatory Visit: Payer: Self-pay | Admitting: Family Medicine

## 2022-03-11 ENCOUNTER — Ambulatory Visit (INDEPENDENT_AMBULATORY_CARE_PROVIDER_SITE_OTHER): Payer: 59 | Admitting: Psychology

## 2022-03-11 DIAGNOSIS — F331 Major depressive disorder, recurrent, moderate: Secondary | ICD-10-CM | POA: Diagnosis not present

## 2022-03-11 MED ORDER — DULOXETINE HCL 60 MG PO CPEP: 60.0000 mg | ORAL_CAPSULE | Freq: Two times a day (BID) | ORAL | 1 refills | Status: DC

## 2022-03-11 NOTE — Progress Notes (Signed)
[facility] Counselor/Therapist Progress Note ? ?Patient ID: Keith Morrow, MRN: [id number],   ? ?Date: 03/11/2022 ? ?Time Spent: 2:00pm - 2:45pm   45 minutes  ? ?Treatment Type: Individual Therapy ? ?Reported Symptoms: stress ? ?Mental Status Exam: ?Appearance:  Casual     ?Behavior: Appropriate  ?Motor: Normal  ?Speech/Language:  Normal Rate  ?Affect: Appropriate  ?Mood: normal  ?Thought process: normal  ?Thought content:   WNL  ?Sensory/Perceptual disturbances:   WNL  ?Orientation: oriented to person, place, time/date, and situation  ?Attention: Good  ?Concentration: Good  ?Memory: WNL  ?Fund of knowledge:  Good  ?Insight:   Good  ?Judgment:  Good  ?Impulse Control: Good  ? ?Risk Assessment: ?Danger to Self:  No ?Self-injurious Behavior: No ?Danger to Others: No ?Duty to Warn:no ?Physical Aggression / Violence:No  ?Access to Firearms a concern: No  ?Gang Involvement:No  ? ?Subjective: Pt present for face-to-face individual therapy via video Webex.  Pt consented to telehealth video therapy due to COVID 19 pandemic.   ?Location of pt: home ?Location of therapist: home office.  ?Pt talked about work.  It has been very busy and has been a lot of paperwork which pt does not like.   It is annual evaluation time and pt is setting his goals for the year.  Pt has been wanting a promotion for awhile and is disappointed that it keeps not happening.   ?Pt talked about frustrations with training his dogs.  They bark a lot and don't come when they are called.  Addressed pt's frustrations and worked on problem solving.  ?Pt and family are taking a trip to [place] to see [name] this weekend.  Pt is looking forward to seeing [name].   ?Pt talked about his mood.  He tends to feel up and down.  He feels isolated even when his family is home bc they isolate in their rooms.   Pt states his isolation and abandonment issues are "high".   Helped pt process his feelings and addressed how he can communicate his needs to his  family.    ?Provided supportive therapy.    ? ?Interventions: Cognitive Behavioral Therapy and Insight-Oriented ? ?Diagnosis: F33.1 ? ?Plan: See pt's Treatment Plan for depression in Therapy Charts.  (Treatment Plan Target Date: 03/21/2022) ?Pt is progressing toward treatment goals.   ?Plan to continue to see pt every two weeks.   ? ?Villa Burgin, LCSW ? ? ? ?

## 2022-03-16 ENCOUNTER — Encounter: Payer: Self-pay | Admitting: Neurology

## 2022-03-17 ENCOUNTER — Other Ambulatory Visit: Payer: Self-pay | Admitting: *Deleted

## 2022-03-17 MED ORDER — CLONAZEPAM 1 MG PO TABS: 1.0000 mg | ORAL_TABLET | Freq: Four times a day (QID) | ORAL | 1 refills | Status: DC

## 2022-03-23 ENCOUNTER — Other Ambulatory Visit: Payer: Self-pay | Admitting: Family Medicine

## 2022-03-23 ENCOUNTER — Other Ambulatory Visit: Payer: Self-pay | Admitting: Neurology

## 2022-03-24 ENCOUNTER — Telehealth: Payer: Self-pay | Admitting: Podiatry

## 2022-03-24 NOTE — Telephone Encounter (Signed)
Orthotics are ready for pu, balance 438.00, Left voicemail. Patient needs to schedule appt. ?

## 2022-03-25 ENCOUNTER — Other Ambulatory Visit: Payer: Self-pay | Admitting: Family Medicine

## 2022-03-25 ENCOUNTER — Other Ambulatory Visit: Payer: Self-pay | Admitting: Neurology

## 2022-03-26 ENCOUNTER — Ambulatory Visit (INDEPENDENT_AMBULATORY_CARE_PROVIDER_SITE_OTHER): Payer: 59 | Admitting: Psychology

## 2022-03-26 DIAGNOSIS — F331 Major depressive disorder, recurrent, moderate: Secondary | ICD-10-CM

## 2022-03-26 NOTE — Progress Notes (Signed)
[facility] Counselor Initial Adult Exam ? ?Name: Keith Morrow ?Date: 03/26/2022 ?MRN: [id number] ?DOB: July 14, 1971 ?PCP: [name], MD ? ?Time spent: 3:00pm-3:50pm   50 minutes ? ?Guardian/Payee:  n/a   ? ?Paperwork requested: No  ? ?Reason for Visit /Presenting Problem:  ?Pt present for face-to-face initial assessment update via video Webex.  Pt consents to telehealth video session due to COVID 19 pandemic. ?Location of pt: home ?Location of therapist: home office.  ?Pt continues to need support dealing with his MS and improving coping skills.    ?Reviewed pt's treatment plan for annual update.  Pt participated in setting treatment goals.   ?Plan to continue to meet every two weeks.   ? ?Mental Status Exam: ?Appearance:   Casual     ?Behavior:  Appropriate  ?Motor:  Normal  ?Speech/Language:   Normal Rate  ?Affect:  Appropriate  ?Mood:  normal  ?Thought process:  normal  ?Thought content:    WNL  ?Sensory/Perceptual disturbances:    WNL  ?Orientation:  oriented to person, place, time/date, and situation  ?Attention:  Good  ?Concentration:  Good  ?Memory:  WNL  ?Fund of knowledge:   Good  ?Insight:    Good  ?Judgment:   Good  ?Impulse Control:  Good  ? ?Reported Symptoms:  frustration, stress ? ?Risk Assessment: ?Danger to Self:  No ?Self-injurious Behavior: No ?Danger to Others: No ?Duty to Warn:no ?Physical Aggression / Violence:No  ?Access to Firearms a concern: No  ?Gang Involvement:No  ?Patient / guardian was educated about steps to take if suicide or homicide risk level increases between visits: n/a ?While future psychiatric events cannot be accurately predicted, the patient does not currently require acute inpatient psychiatric care and does not currently meet [facility] involuntary commitment criteria. ? ?Substance Abuse History: ?Current substance abuse: No    ? ?Past Psychiatric History:   ?Previous psychological history is significant for depression ?Outpatient Providers:Pt has  been in therapy in the past. ?History of Psych Hospitalization: No  ?Psychological Testing:  n/a   ? ?Abuse History:  ?Victim of: No.,  n/a    ?Report needed: No. ?Victim of Neglect:No. ?Perpetrator of  n/a   ?Witness / Exposure to Domestic Violence: No   ?Protective Services Involvement: No  ?Witness to [employer] Violence:  No  ? ?Family History:  ?Family History  ?Problem Relation Age of Onset  ? Heart attack Mother 101  ? Thyroid disease Mother   ? Heart disease Mother   ? Thyroid disease Sister   ? Thyroid disease Brother   ? Heart disease Maternal Grandmother   ? Hypertension Maternal Grandmother   ? Hyperlipidemia Maternal Grandmother   ? Heart disease Maternal Grandfather   ? Hypertension Maternal Grandfather   ? Hyperlipidemia Maternal Grandfather   ? Stroke Paternal Grandfather   ? Thyroid disease Sister   ? Heart disease Father   ?     2 stents  ? Coronary artery disease Other   ?     questionable in father  ? ? ?Living situation: the patient lives with their family ? ?Pt does not have support from his extended family. ?Pt did not feel supported in childhood.  Negative messages from parents impact pt's self esteem.   Family history of depression. ? ?Sexual Orientation: Straight ? ?Relationship Status: married  ?Name of spouse / other:Keith Morrow ?If a parent, number of children / ages:3 adult children ? ?Support Systems: spouse ? ?Financial Stress:  No  ? ?Income/Employment/Disability: Employment ? ?  Military Service: No  ? ?Educational History: ?Education: college graduate ? ?Religion/Sprituality/World View: ?Protestant ? ?Any cultural differences that may affect / interfere with treatment:  not applicable  ? ?Recreation/Hobbies: reading ? ?Stressors: Health problems   ?Marital or family conflict   ? ?Strengths: Supportive Relationships, Self Advocate, and Able to Communicate Effectively ? ?Barriers:  none  ? ?Legal History: ?Pending legal issue / charges: The patient has no significant history of legal  issues. ?History of legal issue / charges:  n/a ? ?Medical History/Surgical History: reviewed ?Past Medical History:  ?Diagnosis Date  ? Acute bronchitis 05/09/2013  ? Allergy   ? Bronchitis, acute 12/10/2011  ? Candidal skin infection 05/05/2017  ? Chicken pox as a child  ? Constipation 02/20/2017  ? Coronary atherosclerosis of native coronary artery   ? a. NSTEMI 04/20/11: tx with Promus DES to LAD; bifurcation lesion with oDx 90% tx with POBA; b. staged PCI of right post. AV Branch with Promus DES; c. residual at cath 04/21/11:  AV groove CFX 50%, mRCA 30%,; EF 55%  ? Depression   ? Depression with anxiety   ? Dermatitis, contact 12/10/2011  ? Fatigue 10/15/2012  ? Heart attack (HCC) 04-20-11  ? HTN (hypertension)   ? Hyperlipidemia   ? Hypotestosteronism 11/17/2011  ? Hypothyroidism   ? Keloid scar   ? MS (multiple sclerosis) (HCC)   ? Neuromuscular disorder (HCC)   ? NUMBNESS/TINGLING  ? Obesity   ? Onychomycosis   ? Preventative health care 10/07/2011  ? Reflux 10/07/2011  ? Sleep apnea   ? Sun-damaged skin 02/17/2016  ? Testosterone deficiency 01/16/2012  ? Tinea pedis   ? 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 Visual changes 11/13/2016  ? ? ?Past Surgical History:  ?Procedure Laterality Date  ? CORONARY ANGIOPLASTY WITH STENT PLACEMENT    ? 2012  ? PAIN PUMP IMPLANTATION N/A 08/29/2014  ? Procedure: Baclofen pump placement;  Surgeon: Maeola Harman, MD;  Location: [facility];  Service: Neurosurgery;  Laterality: N/A;  Baclofen pump placement  ? PAIN PUMP IMPLANTATION Right 09/20/2020  ? Procedure: Right Baclofen pump replacement;  Surgeon: Maeola Harman, MD;  Location: [facility] OR;  Service: Neurosurgery;  Laterality: Right;  Right Baclofen pump replacement  ? PROGRAMABLE BACLOFEN PUMP REVISION  08/29/14  ? right wrist surgery    ? CTR  ? VASCULAR SURGERY    ? vericose vein in right femoral area  ? VASECTOMY    ? WISDOM TOOTH EXTRACTION    ? ? ?Medications: ?Current Outpatient Medications  ?Medication Sig Dispense Refill  ?  aspirin EC 81 MG tablet Take 81 mg by mouth daily. (Patient not taking: Reported on 03/03/2022)    ? atorvastatin (LIPITOR) 40 MG tablet TAKE 1 TABLET (40 MG TOTAL) BY MOUTH DAILY AT 6 PM. 90 tablet 3  ? BACLOFEN IT 1 Dose by Intrathecal route as directed.     ? busPIRone (BUSPAR) 10 MG tablet TAKE 1 TABLET THREE TIMES A DAY 270 tablet 1  ? cetirizine (ZYRTEC) 10 MG tablet Take 10 mg by mouth daily as needed for allergies.    ? clobetasol cream (TEMOVATE) 0.05 % Apply topically 2 (two) times daily as needed.    ? clonazePAM (KLONOPIN) 1 MG tablet Take 1 tablet (1 mg total) by mouth 4 (four) times daily. 360 tablet 1  ? Clonidine HCl POWD by Does not apply route. 720mg /ml IT Baclofen. Total dose 157.89mg /day. Last pump refill 06/02/16    ? clotrimazole-betamethasone (LOTRISONE)  cream Apply 1 application topically 2 (two) times daily as needed (anti-fungal). 30 g 2  ? cyclobenzaprine (FLEXERIL) 10 MG tablet TAKE 1 TABLET BY MOUTH THREE TIMES A DAY AS NEEDED FOR MUSCLE SPASMS 90 tablet 11  ? dalfampridine 10 MG TB12 TAKE 1 TABLET EVERY 12 HOURS 60 tablet 5  ? DULoxetine (CYMBALTA) 60 MG capsule Take 1 capsule (60 mg total) by mouth 2 (two) times daily. Brand name only 180 capsule 1  ? famotidine (PEPCID) 40 MG tablet TAKE 1 TABLET DAILY AT BEDTIME 90 tablet 3  ? furosemide (LASIX) 20 MG tablet 1 ta po daily x 5 days and then as needed for SOB, weight gain>3#, pedal edema and can take a second tab daily as needed 45 tablet 3  ? gabapentin (NEURONTIN) 600 MG tablet Take 1 tablet (600 mg total) by mouth 4 (four) times daily. 360 tablet 1  ? ibuprofen (ADVIL,MOTRIN) 200 MG tablet Take 200 mg by mouth every 6 (six) hours as needed for moderate pain.    ? ketoconazole (NIZORAL) 2 % shampoo Apply topically.    ? lamoTRIgine (LAMICTAL) 100 MG tablet Take 1 tablet (100 mg total) by mouth 2 (two) times daily. (Patient not taking: Reported on 03/03/2022) 60 tablet 5  ? levothyroxine (SYNTHROID) 137 MCG tablet Take 1 tablet (137 mcg  total) by mouth daily before breakfast. 90 tablet 1  ? methylphenidate (RITALIN) 10 MG tablet Take 1 tablet (10 mg total) by mouth 4 (four) times daily. 120 tablet 0  ? natalizumab (TYSABRI) 300 MG/15ML injection

## 2022-04-07 ENCOUNTER — Ambulatory Visit (INDEPENDENT_AMBULATORY_CARE_PROVIDER_SITE_OTHER): Payer: Managed Care, Other (non HMO) | Admitting: Cardiovascular Disease

## 2022-04-07 ENCOUNTER — Encounter: Payer: Self-pay | Admitting: Cardiovascular Disease

## 2022-04-07 VITALS — BP 120/80 | HR 74 | Ht 75.0 in | Wt 309.0 lb

## 2022-04-07 DIAGNOSIS — I10 Essential (primary) hypertension: Secondary | ICD-10-CM | POA: Diagnosis not present

## 2022-04-07 DIAGNOSIS — E782 Mixed hyperlipidemia: Secondary | ICD-10-CM

## 2022-04-07 DIAGNOSIS — I2510 Atherosclerotic heart disease of native coronary artery without angina pectoris: Secondary | ICD-10-CM

## 2022-04-07 MED ORDER — ATORVASTATIN CALCIUM 40 MG PO TABS: 40.0000 mg | ORAL_TABLET | Freq: Every day | ORAL | 3 refills | Status: DC

## 2022-04-07 NOTE — Patient Instructions (Signed)
Medication Instructions:  ?REFILLED Atorvastatin ?*If you need a refill on your cardiac medications before your next appointment, please call your pharmacy* ? ? ?Lab Work: ?NONE ?If you have labs (blood work) drawn today and your tests are completely normal, you will receive your results only by: ?MyChart Message (if you have MyChart) OR ?A paper copy in the mail ?If you have any lab test that is abnormal or we need to change your treatment, we will call you to review the results. ? ? ?Testing/Procedures: ?NONE ? ? ?Follow-Up: ?At [facility], you and your health needs are our priority.  As part of our continuing mission to provide you with exceptional heart care, we have created designated Provider Care Teams.  These Care Teams include your primary Cardiologist (physician) and Advanced Practice Providers (APPs -  Physician Assistants and Nurse Practitioners) who all work together to provide you with the care you need, when you need it. ? ?Your next appointment:   ?1 year(s) ? ?The format for your next appointment:   ?In Person ? ?Provider:   ?Tonny Bollman, MD   ? ?  ? ?Important Information About Sugar ? ? ? ? ?  ?

## 2022-04-07 NOTE — Progress Notes (Signed)
?Cardiology Office Note:   ? ?Date:  04/07/2022  ? ?ID:  Keith Morrow, DOB 1971/11/14, MRN [id number] ? ?PCP:  [name], MD ?  ?[facility] Providers ?[name], MD    ? ?Referring MD: [name], MD  ? ?Chief Complaint  ?Patient presents with  ? Coronary Artery Disease  ? ? ?History of Present Illness:   ? ?Keith Morrow is a 51 y.o. male with a hx of multiple sclerosis, hypertension, mixed hyperlipidemia, and coronary artery disease, presenting for follow-up evaluation.  The patient initially presented in 2012 with non-STEMI and was treated with stenting of the LAD, angioplasty of diagonal, and staged PCI of the posterior AV segment of the RCA.  LVEF was noted to be preserved.  ? ?The patient is here alone today.  He has become nonambulatory because of progression of his multiple sclerosis.  He is in a wheelchair today.  He struggles with chronic lower extremity edema but elevates his legs and uses compression stockings.  Diuretic therapy has not helped him much.  He denies any recent chest pain, chest pressure, shortness of breath, or heart palpitations.  Patient is compliant with his medications. ? ?Past Medical History:  ?Diagnosis Date  ? Acute bronchitis 05/09/2013  ? Allergy   ? Bronchitis, acute 12/10/2011  ? Candidal skin infection 05/05/2017  ? Chicken pox as a child  ? Constipation 02/20/2017  ? Coronary atherosclerosis of native coronary artery   ? a. NSTEMI 04/20/11: tx with Promus DES to LAD; bifurcation lesion with oDx 90% tx with POBA; b. staged PCI of right post. AV Branch with Promus DES; c. residual at cath 04/21/11:  AV groove CFX 50%, mRCA 30%,; EF 55%  ? Depression   ? Depression with anxiety   ? Dermatitis, contact 12/10/2011  ? Fatigue 10/15/2012  ? Heart attack (HCC) 04-20-11  ? HTN (hypertension)   ? Hyperlipidemia   ? Hypotestosteronism 11/17/2011  ? Hypothyroidism   ? Keloid scar   ? MS (multiple sclerosis) (HCC)   ? Neuromuscular disorder (HCC)   ?  NUMBNESS/TINGLING  ? Obesity   ? Onychomycosis   ? Preventative health care 10/07/2011  ? Reflux 10/07/2011  ? Sleep apnea   ? Sun-damaged skin 02/17/2016  ? Testosterone deficiency 01/16/2012  ? Tinea pedis   ? 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 Visual changes 11/13/2016  ? ? ?Past Surgical History:  ?Procedure Laterality Date  ? CORONARY ANGIOPLASTY WITH STENT PLACEMENT    ? 2012  ? PAIN PUMP IMPLANTATION N/A 08/29/2014  ? Procedure: Baclofen pump placement;  Surgeon: Maeola Harman, MD;  Location: [facility];  Service: Neurosurgery;  Laterality: N/A;  Baclofen pump placement  ? PAIN PUMP IMPLANTATION Right 09/20/2020  ? Procedure: Right Baclofen pump replacement;  Surgeon: Maeola Harman, MD;  Location: [facility] OR;  Service: Neurosurgery;  Laterality: Right;  Right Baclofen pump replacement  ? PROGRAMABLE BACLOFEN PUMP REVISION  08/29/14  ? right wrist surgery    ? CTR  ? VASCULAR SURGERY    ? vericose vein in right femoral area  ? VASECTOMY    ? WISDOM TOOTH EXTRACTION    ? ? ?Current Medications: ?Current Meds  ?Medication Sig  ? aspirin EC 81 MG tablet Take 81 mg by mouth daily.  ? BACLOFEN IT 1 Dose by Intrathecal route as directed.   ? busPIRone (BUSPAR) 10 MG tablet TAKE 1 TABLET THREE TIMES A DAY  ? cetirizine (ZYRTEC) 10  MG tablet Take 10 mg by mouth daily as needed for allergies.  ? clobetasol cream (TEMOVATE) 0.05 % Apply topically 2 (two) times daily as needed.  ? clonazePAM (KLONOPIN) 1 MG tablet Take 1 tablet (1 mg total) by mouth 4 (four) times daily.  ? Clonidine HCl POWD by Does not apply route. /ml IT Baclofen. Total dose 157. /day. Last pump refill 06/02/16  ? clotrimazole-betamethasone (LOTRISONE) cream Apply 1 application topically 2 (two) times daily as needed (anti-fungal).  ? cyclobenzaprine (FLEXERIL) 10 MG tablet TAKE 1 TABLET BY MOUTH THREE TIMES A DAY AS NEEDED FOR MUSCLE SPASMS  ? dalfampridine 10 MG TB12 TAKE 1 TABLET EVERY 12 HOURS  ? DULoxetine (CYMBALTA) 60 MG capsule Take 1  capsule (60 mg total) by mouth 2 (two) times daily. Brand name only  ? EPINEPHrine (ADRENALIN) 1 MG/ML SOLN Inject into the skin.  ? famotidine (PEPCID) 40 MG tablet TAKE 1 TABLET DAILY AT BEDTIME  ? furosemide (LASIX) 20 MG tablet 1 ta po daily x 5 days and then as needed for SOB, weight gain>3#, pedal edema and can take a second tab daily as needed  ? gabapentin (NEURONTIN) 600 MG tablet Take 1 tablet (600 mg total) by mouth 4 (four) times daily.  ? ibuprofen (ADVIL,MOTRIN) 200 MG tablet Take 200 mg by mouth every 6 (six) hours as needed for moderate pain.  ? ketoconazole (NIZORAL) 2 % shampoo Apply topically.  ? levothyroxine (SYNTHROID) 137 MCG tablet Take 1 tablet (137 mcg total) by mouth daily before breakfast.  ? methylphenidate (RITALIN) 10 MG tablet Take 1 tablet (10 mg total) by mouth 4 (four) times daily.  ? natalizumab (TYSABRI) 300 MG/15ML injection Inject 300 mg into the vein every 28 (twenty-eight) days.   ? nitroGLYCERIN (NITROSTAT) 0.4 MG SL tablet PLACE 1 TABLET UNDER THE TOUNGE EVERY 5 MINUTES AS NEEDED FOR CHEST PAIN (X 3 DOSES)  ? nystatin cream (MYCOSTATIN) Apply 1 application topically as directed.  ? olmesartan-hydrochlorothiazide (BENICAR HCT) 20-12.5 MG tablet TAKE 1 TABLET DAILY  ? omeprazole (PRILOSEC) 40 MG capsule Take 1 capsule (40 mg total) by mouth daily.  ? potassium chloride SA (KLOR-CON M20) 20 MEQ tablet TAKE 1 TABLET BY MOUTH EVERY DAY AS NEEDED WHEN TAKING A SECOND LASIX TAB ANY GIVEN DAY  ? sucralfate (CARAFATE) 1 g tablet TAKE 1 TABLET BY MOUTH 4 TIMES DAILY - WITH MEALS AND AT BEDTIME. TAKE TABLET OR LIQUID BUT NOT BOTH  ? sucralfate (CARAFATE) 1 GM/10ML suspension Take 10 mLs (1 g total) by mouth 4 (four) times daily -  with meals and at bedtime. Take liquid or tablets but not both  ? tamsulosin (FLOMAX) 0.4 MG CAPS capsule TAKE 1 CAPSULE DAILY  ? [DISCONTINUED] atorvastatin (LIPITOR) 40 MG tablet TAKE 1 TABLET (40 MG TOTAL) BY MOUTH DAILY AT 6 PM.  ?  ? ?Allergies:    Patient has no known allergies.  ? ?Social History  ? ?Socioeconomic History  ? Marital status: Married  ?  Spouse name: Not on file  ? Number of children: 3  ? Years of education: Not on file  ? Highest education level: Not on file  ?Occupational History  ? Not on file  ?Tobacco Use  ? Smoking status: Never  ? Smokeless tobacco: Never  ?Vaping Use  ? Vaping Use: Never used  ?Substance and Sexual Activity  ? Alcohol use: Yes  ?  Comment: socially  ? Drug use: No  ? Sexual activity: Yes  ?  Partners: Female  ?  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 Not on file  ?Social History Narrative  ? Not on file  ? ?Social Determinants of Health  ? ?Financial Resource Strain: Not on file  ?Food Insecurity: Not on file  ?Transportation Needs: Not on file  ?Physical Activity: Not on file  ?Stress: Not on file  ?Social Connections: Not on file  ?  ? ?Family History: ?The patient's family history includes Coronary artery disease in an other family member; Heart attack (age of onset: 10) in his mother; Heart disease in his father, maternal grandfather, maternal grandmother, and mother; Hyperlipidemia in his maternal grandfather and maternal grandmother; Hypertension in his maternal grandfather and maternal grandmother; Stroke in his paternal grandfather; Thyroid disease in his brother, mother, sister, and sister. ? ?ROS:   ?Please see the history of present illness.    ?All other systems reviewed and are negative. ? ?EKGs/Labs/Other Studies Reviewed:   ? ?The following studies were reviewed today: ?Echo 01/11/20: ?1. Left ventricular ejection fraction, by visual estimation, is 60 to  ?65%. The left ventricle has normal function. There is no left ventricular  ?hypertrophy.  ? 2. The left ventricle has no regional wall motion abnormalities.  ? 3. Global right ventricle has normal systolic function.The right  ?ventricular size is normal. No increase in right  ventricular wall  ?thickness.  ? 4. Left atrial size was normal.  ? 5. Right atrial size was normal.  ? 6. The mitral valve is normal in structure. Trivial mitral valve  ?regurgitation. No evidence of mitral stenosis.  ? 7.

## 2022-04-11 ENCOUNTER — Ambulatory Visit (INDEPENDENT_AMBULATORY_CARE_PROVIDER_SITE_OTHER): Payer: 59 | Admitting: Psychology

## 2022-04-11 DIAGNOSIS — F331 Major depressive disorder, recurrent, moderate: Secondary | ICD-10-CM

## 2022-04-11 NOTE — Progress Notes (Signed)
[facility] Counselor/Therapist Progress Note ? ?Patient ID: Keith Morrow, MRN: [id number],   ? ?Date: 04/11/2022 ? ?Time Spent: 11:00am - 11:45am  45 minutes  ? ?Treatment Type: Individual Therapy ? ?Reported Symptoms: stress ? ?Mental Status Exam: ?Appearance:  Casual     ?Behavior: Appropriate  ?Motor: Normal  ?Speech/Language:  Normal Rate  ?Affect: Appropriate  ?Mood: normal  ?Thought process: normal  ?Thought content:   WNL  ?Sensory/Perceptual disturbances:   WNL  ?Orientation: oriented to person, place, time/date, and situation  ?Attention: Good  ?Concentration: Good  ?Memory: WNL  ?Fund of knowledge:  Good  ?Insight:   Good  ?Judgment:  Good  ?Impulse Control: Good  ? ?Risk Assessment: ?Danger to Self:  No ?Self-injurious Behavior: No ?Danger to Others: No ?Duty to Warn:no ?Physical Aggression / Violence:No  ?Access to Firearms a concern: No  ?Gang Involvement:No  ? ?Subjective:  ?Pt present for face-to-face individual therapy via video Webex.  Pt consents to telehealth video session due to COVID 19 pandemic. ?Location of pt: home ?Location of therapist: home office.   ?Pt talked about having some ups and downs.  He had a hard start to the week.  He felt a lot of pressure and overwhelm.  A lot of his stress was work related.  Pt states he internalized things that he did not need to.  He felt put upon and discussed his frustrations with his boss.   The conversation was helpful.  His boss is working on a promotion for pt.   ?Helped pt process his feelings and work dynamics.   ?Pt talked about his daughter Keith Morrow.  Her cat needs double knee surgery which is very expensive.  Pt is concerned about the money but will go ahead and schedule the surgery for the cat.  ?Keith Morrow will be coming home from college for the summer.  Pt knows there will be an adjustment time that he hopes goes well. ?Provided supportive therapy.  ? ?Interventions: Cognitive Behavioral Therapy and Insight-Oriented ? ?Diagnosis:  F33.1 ? ?Plan of Care: Recommend ongoing therapy.   Plan to meet every two weeks.   Pt is progressing toward goals.   ? ?Treatment Plan  (Treatment Plan Target Date: 03/27/2023) ?Client Abilities/Strengths  ?Pt is bright, engaging, and motivated for therapy.   ?Client Treatment Preferences  ?Individual therapy.  ?Client Statement of Needs  ?Improve coping skills.  ?Symptoms  ?Depressed or irritable mood. ?Feelings of hopelessness, worthlessness, or inappropriate guilt. ?Low self-esteem. ? ?Problems Addressed  ?Unipolar Depression ?Goals ?1. Alleviate depressive symptoms and return to previous level of effective functioning. ?2. Appropriately grieve the loss in order to normalize mood and to return to previously adaptive level of functioning. ?Objective ?Learn and implement behavioral strategies to overcome depression. ?Target Date: 2023-03-27 Frequency: Biweekly  ?Progress: 50 Modality: individual  ?Related Interventions ?Engage the client in "behavioral activation," increasing his/her activity level and contact with sources of reward, while identifying processes that inhibit activation.  Use behavioral techniques such as instruction, rehearsal, role-playing, role reversal, as needed, to facilitate activity in the client's daily life; reinforce success. ?Assist the client in developing skills that increase the likelihood of deriving pleasure from behavioral activation (e.g., assertiveness skills, developing an exercise plan, less internal/more external focus, increased social involvement); reinforce success. ?Objective ?Identify important people in life, past and present, and describe the quality, good and poor, of those relationships. ?Target Date: 2023-03-27 Frequency: Biweekly  ?Progress: 50 Modality: individual  ?Related Interventions ?Conduct Interpersonal Therapy beginning with  the assessment of the client's "interpersonal inventory" of important past and present relationships; develop a case formulation  linking depression to grief, interpersonal role disputes, role transitions, and/or interpersonal deficits). ?Objective ?Learn and implement problem-solving and decision-making skills. ?Target Date: 2023-03-27 Frequency: Biweekly  ?Progress: 50 Modality: individual  ?Related Interventions ?Conduct Problem-Solving Therapy using techniques such as psychoeducation, modeling, and role-playing to teach client problem-solving skills (i.e., defining a problem specifically, generating possible solutions, evaluating the pros and cons of each solution, selecting and implementing a plan of action, evaluating the efficacy of the plan, accepting or revising the plan); role-play application of the problem-solving skill to a real life issue. ?Encourage in the client the development of a positive problem orientation in which problems and solving them are viewed as a natural part of life and not something to be feared, despaired, or avoided. ?3. Develop healthy interpersonal relationships that lead to the alleviation and help prevent the relapse of depression. ?4. Develop healthy thinking patterns and beliefs about self, others, and the world that lead to the alleviation and help prevent the relapse of depression. ?5. Recognize, accept, and cope with feelings of depression. ?Diagnosis ?F33.1  ?Conditions For Discharge ?Achievement of treatment goals and objectives  ? ?Keith Wiltsey, LCSW ? ? ? ?

## 2022-04-18 ENCOUNTER — Ambulatory Visit: Payer: Managed Care, Other (non HMO)

## 2022-04-18 DIAGNOSIS — L97521 Non-pressure chronic ulcer of other part of left foot limited to breakdown of skin: Secondary | ICD-10-CM

## 2022-04-18 DIAGNOSIS — L97511 Non-pressure chronic ulcer of other part of right foot limited to breakdown of skin: Secondary | ICD-10-CM

## 2022-04-18 DIAGNOSIS — L84 Corns and callosities: Secondary | ICD-10-CM

## 2022-04-18 NOTE — Progress Notes (Signed)
SITUATION: ?Reason for Visit: Fitting and Delivery of Custom Fabricated Foot Orthoses ?Patient Report: Patient reports comfort and is satisfied with device. ? ?OBJECTIVE DATA: ?Patient History / Diagnosis:   ?  ICD-10-CM   ?1. Skin ulcer of plantar aspect of right foot, limited to breakdown of skin (HCC)  L97.511   ?  ?2. Skin ulcer of left great toe, limited to breakdown of skin (HCC)  L97.521   ?  ?3. Pre-ulcerative calluses  L84   ?  ? ? ?Provided Device:  Custom Functional Foot Orthotics ?    [employer]: [id number] ? ?GOAL OF ORTHOSIS ?- Improve gait ?- Decrease energy expenditure ?- Improve Balance ?- Provide Triplanar stability of foot complex ?- Facilitate motion ? ?ACTIONS PERFORMED ?Patient was fit with foot orthotics trimmed to shoe last. Patient tolerated fittign procedure.  ? ?Patient was provided with verbal and written instruction and demonstration regarding donning, doffing, wear, care, proper fit, function, purpose, cleaning, and use of the orthosis and in all related precautions and risks and benefits regarding the orthosis. ? ?Patient was also provided with verbal instruction regarding how to report any failures or malfunctions of the orthosis and necessary follow up care. Patient was also instructed to contact our office regarding any change in status that may affect the function of the orthosis. ? ?Patient demonstrated independence with proper donning, doffing, and fit and verbalized understanding of all instructions. ? ?PLAN: ?Patient is to follow up in one week or as necessary (PRN). All questions were answered and concerns addressed. Plan of care was discussed with and agreed upon by the patient. ? ?

## 2022-04-20 ENCOUNTER — Encounter: Payer: Self-pay | Admitting: Family Medicine

## 2022-04-25 ENCOUNTER — Ambulatory Visit (INDEPENDENT_AMBULATORY_CARE_PROVIDER_SITE_OTHER): Payer: 59 | Admitting: Psychology

## 2022-04-25 DIAGNOSIS — F331 Major depressive disorder, recurrent, moderate: Secondary | ICD-10-CM

## 2022-04-25 NOTE — Progress Notes (Signed)
[facility] Counselor/Therapist Progress Note  Patient ID: Keith Morrow, MRN: [id number],    Date: 04/25/2022  Time Spent: 11:00am - 11:45am  45 minutes   Treatment Type: Individual Therapy  Reported Symptoms: stress  Mental Status Exam: Appearance:  Casual     Behavior: Appropriate  Motor: Normal  Speech/Language:  Normal Rate  Affect: Appropriate  Mood: normal  Thought process: normal  Thought content:   WNL  Sensory/Perceptual disturbances:   WNL  Orientation: oriented to person, place, time/date, and situation  Attention: Good  Concentration: Good  Memory: WNL  Fund of knowledge:  Good  Insight:   Good  Judgment:  Good  Impulse Control: Good   Risk Assessment: Danger to Self:  No Self-injurious Behavior: No Danger to Others: No Duty to Warn:no Physical Aggression / Violence:No  Access to Firearms a concern: No  Gang Involvement:No   Subjective:  Pt present for face-to-face individual therapy via video Webex.  Pt consents to telehealth video session due to COVID 19 pandemic. Location of pt: home Location of therapist: home office.   Pt talked about going to physical therapy.  He is getting bored with it but knows he needs to keep going.  He is not making improvements but is sustaining function.   Pt talked about work.  He has been busy and hectic.  Addressed the stressed and worked on [employer].   Pt talked about his health.  He has had some increased pain recently.  Addressed pt's health concerns.  Encouraged pt to take breaks at work and stretch.  Pt talked about trying to adjust to [name] being back in the house for the summer.  [name] and [name] are having some conflict.   Provided supportive therapy.   Interventions: Cognitive Behavioral Therapy and Insight-Oriented  Diagnosis: F33.1  Plan of Care: Recommend ongoing therapy.   Plan to meet every two weeks.   Pt is progressing toward goals.    Treatment Plan  (Treatment Plan Target  Date: 03/27/2023) Client Abilities/Strengths  Pt is bright, engaging, and motivated for therapy.   Client Treatment Preferences  Individual therapy.  Client Statement of Needs  Improve coping skills.  Symptoms  Depressed or irritable mood. Feelings of hopelessness, worthlessness, or inappropriate guilt. Low self-esteem.  Problems Addressed  Unipolar Depression Goals 1. Alleviate depressive symptoms and return to previous level of effective functioning. 2. Appropriately grieve the loss in order to normalize mood and to return to previously adaptive level of functioning. Objective Learn and implement behavioral strategies to overcome depression. Target Date: 2023-03-27 Frequency: Biweekly  Progress: 50 Modality: individual  Related Interventions Engage the client in "behavioral activation," increasing his/her activity level and contact with sources of reward, while identifying processes that inhibit activation.  Use behavioral techniques such as instruction, rehearsal, role-playing, role reversal, as needed, to facilitate activity in the client's daily life; reinforce success. Assist the client in developing skills that increase the likelihood of deriving pleasure from behavioral activation (e.g., assertiveness skills, developing an exercise plan, less internal/more external focus, increased social involvement); reinforce success. Objective Identify important people in life, past and present, and describe the quality, good and poor, of those relationships. Target Date: 2023-03-27 Frequency: Biweekly  Progress: 50 Modality: individual  Related Interventions Conduct Interpersonal Therapy beginning with the assessment of the client's "interpersonal inventory" of important past and present relationships; develop a case formulation linking depression to grief, interpersonal role disputes, role transitions, and/or interpersonal deficits). Objective Learn and implement problem-solving and  decision-making  skills. Target Date: 2023-03-27 Frequency: Biweekly  Progress: 50 Modality: individual  Related Interventions Conduct Problem-Solving Therapy using techniques such as psychoeducation, modeling, and role-playing to teach client problem-solving skills (i.e., defining a problem specifically, generating possible solutions, evaluating the pros and cons of each solution, selecting and implementing a plan of action, evaluating the efficacy of the plan, accepting or revising the plan); role-play application of the problem-solving skill to a real life issue. Encourage in the client the development of a positive problem orientation in which problems and solving them are viewed as a natural part of life and not something to be feared, despaired, or avoided. 3. Develop healthy interpersonal relationships that lead to the alleviation and help prevent the relapse of depression. 4. Develop healthy thinking patterns and beliefs about self, others, and the world that lead to the alleviation and help prevent the relapse of depression. 5. Recognize, accept, and cope with feelings of depression. Diagnosis F33.1  Conditions For Discharge Achievement of treatment goals and objectives   Salomon Fick, LCSW

## 2022-04-28 ENCOUNTER — Other Ambulatory Visit: Payer: Self-pay | Admitting: Family Medicine

## 2022-04-30 ENCOUNTER — Encounter: Payer: Self-pay | Admitting: Neurology

## 2022-05-05 ENCOUNTER — Other Ambulatory Visit: Payer: Self-pay | Admitting: Family Medicine

## 2022-05-09 ENCOUNTER — Ambulatory Visit (INDEPENDENT_AMBULATORY_CARE_PROVIDER_SITE_OTHER): Payer: 59 | Admitting: Psychology

## 2022-05-09 DIAGNOSIS — F331 Major depressive disorder, recurrent, moderate: Secondary | ICD-10-CM

## 2022-05-09 NOTE — Progress Notes (Signed)
[facility] Counselor/Therapist Progress Note  Patient ID: Keith Morrow, MRN: [id number],    Date: 05/09/2022  Time Spent: 11:00am - 11:45am  45 minutes   Treatment Type: Individual Therapy  Reported Symptoms: stress  Mental Status Exam: Appearance:  Casual     Behavior: Appropriate  Motor: Normal  Speech/Language:  Normal Rate  Affect: Appropriate  Mood: normal  Thought process: normal  Thought content:   WNL  Sensory/Perceptual disturbances:   WNL  Orientation: oriented to person, place, time/date, and situation  Attention: Good  Concentration: Good  Memory: WNL  Fund of knowledge:  Good  Insight:   Good  Judgment:  Good  Impulse Control: Good   Risk Assessment: Danger to Self:  No Self-injurious Behavior: No Danger to Others: No Duty to Warn:no Physical Aggression / Violence:No  Access to Firearms a concern: No  Gang Involvement:No   Subjective:  Pt present for face-to-face individual therapy via video Webex.  Pt consents to telehealth video session due to COVID 19 pandemic. Location of pt: home Location of therapist: home office.   Pt talked about work.  He has been very busy.  Pt tends to feel overwhelmed at the beginning of the week.   Addressed how pt can manage the overwhelm and worked on [employer], dispensing.   Pt is upset that his neighborhood will be changing bc of new construction.  They will not have the privacy  that they have now. Pt has had 3 falls lately.   Addressed how the falls occurred and what injuries he sustained.   Pt talked about [name] being back in the house this summer.  Pt states they are having a reacclimation issue bc [name] is not picking up after herself. They are having talks with her but she is not complying.  [name] is moving into her grandmother's mountain house in the fall semester.  [name] will need to manage the household which will be a learning curve for her.   Addressed pt's concerns about [name].   Provided supportive  therapy.   Interventions: Cognitive Behavioral Therapy and Insight-Oriented  Diagnosis: F33.1  Plan of Care: Recommend ongoing therapy.   Plan to meet every two weeks.   Pt is progressing toward goals.    Treatment Plan  (Treatment Plan Target Date: 03/27/2023) Client Abilities/Strengths  Pt is bright, engaging, and motivated for therapy.   Client Treatment Preferences  Individual therapy.  Client Statement of Needs  Improve coping skills.  Symptoms  Depressed or irritable mood. Feelings of hopelessness, worthlessness, or inappropriate guilt. Low self-esteem.  Problems Addressed  Unipolar Depression Goals 1. Alleviate depressive symptoms and return to previous level of effective functioning. 2. Appropriately grieve the loss in order to normalize mood and to return to previously adaptive level of functioning. Objective Learn and implement behavioral strategies to overcome depression. Target Date: 2023-03-27 Frequency: Biweekly  Progress: 50 Modality: individual  Related Interventions Engage the client in "behavioral activation," increasing his/her activity level and contact with sources of reward, while identifying processes that inhibit activation.  Use behavioral techniques such as instruction, rehearsal, role-playing, role reversal, as needed, to facilitate activity in the client's daily life; reinforce success. Assist the client in developing skills that increase the likelihood of deriving pleasure from behavioral activation (e.g., assertiveness skills, developing an exercise plan, less internal/more external focus, increased social involvement); reinforce success. Objective Identify important people in life, past and present, and describe the quality, good and poor, of those relationships. Target Date: 2023-03-27 Frequency: Biweekly  Progress: 50 Modality: individual  Related Interventions Conduct Interpersonal Therapy beginning with the assessment of the client's  "interpersonal inventory" of important past and present relationships; develop a case formulation linking depression to grief, interpersonal role disputes, role transitions, and/or interpersonal deficits). Objective Learn and implement problem-solving and decision-making skills. Target Date: 2023-03-27 Frequency: Biweekly  Progress: 50 Modality: individual  Related Interventions Conduct Problem-Solving Therapy using techniques such as psychoeducation, modeling, and role-playing to teach client problem-solving skills (i.e., defining a problem specifically, generating possible solutions, evaluating the pros and cons of each solution, selecting and implementing a plan of action, evaluating the efficacy of the plan, accepting or revising the plan); role-play application of the problem-solving skill to a real life issue. Encourage in the client the development of a positive problem orientation in which problems and solving them are viewed as a natural part of life and not something to be feared, despaired, or avoided. 3. Develop healthy interpersonal relationships that lead to the alleviation and help prevent the relapse of depression. 4. Develop healthy thinking patterns and beliefs about self, others, and the world that lead to the alleviation and help prevent the relapse of depression. 5. Recognize, accept, and cope with feelings of depression. Diagnosis F33.1  Conditions For Discharge Achievement of treatment goals and objectives   Salomon Fick, LCSW

## 2022-05-11 ENCOUNTER — Telehealth: Payer: Managed Care, Other (non HMO) | Admitting: Nurse Practitioner

## 2022-05-11 DIAGNOSIS — U071 COVID-19: Secondary | ICD-10-CM

## 2022-05-11 MED ORDER — NIRMATRELVIR/RITONAVIR (PAXLOVID)TABLET: 3.0000 | ORAL_TABLET | Freq: Two times a day (BID) | ORAL | 0 refills | Status: AC

## 2022-05-11 NOTE — Progress Notes (Signed)
Virtual Visit Consent   Keith Morrow, you are scheduled for a virtual visit with Mary-Margaret Daphine Deutscher, FNP, a [facility] provider, today.     Just as with appointments in the office, your consent must be obtained to participate.  Your consent will be active for this visit and any virtual visit you may have with one of our providers in the next 365 days.     If you have a MyChart account, a copy of this consent can be sent to you electronically.  All virtual visits are billed to your insurance company just like a traditional visit in the office.    As this is a virtual visit, video technology does not allow for your provider to perform a traditional examination.  This may limit your provider's ability to fully assess your condition.  If your provider identifies any concerns that need to be evaluated in person or the need to arrange testing (such as labs, EKG, etc.), we will make arrangements to do so.     Although advances in technology are sophisticated, we cannot ensure that it will always work on either your end or our end.  If the connection with a video visit is poor, the visit may have to be switched to a telephone visit.  With either a video or telephone visit, we are not always able to ensure that we have a secure connection.     I need to obtain your verbal consent now.   Are you willing to proceed with your visit today? YES   Keith Morrow has provided verbal consent on 05/11/2022 for a virtual visit (video or telephone).   Mary-Margaret Daphine Deutscher, FNP   Date: 05/11/2022 10:39 AM   Virtual Visit via Video Note   I, Mary-Margaret Daphine Deutscher, connected with Keith Morrow ([id number], [birth year]) on 05/11/22 at 10:45 AM EDT by a video-enabled telemedicine application and verified that I am speaking with the correct person using two identifiers.  Location: Patient: Virtual Visit Location Patient: Home Provider: Virtual Visit Location Provider: Mobile   I discussed the limitations of  evaluation and management by telemedicine and the availability of in person appointments. The patient expressed understanding and agreed to proceed.    History of Present Illness: Keith Morrow is a 51 y.o. who identifies as a male who was assigned male at birth, and is being seen today for feels bad.  HPI: URI  This is a new problem. Episode onset: 2 days. The problem has been gradually worsening. There has been no fever. Associated symptoms include congestion, coughing, headaches, rhinorrhea and sinus pain. Pertinent negatives include no sore throat. The treatment provided no relief.  Tested positive for covid this morning. Review of Systems  HENT:  Positive for congestion, rhinorrhea and sinus pain. Negative for sore throat.   Respiratory:  Positive for cough.   Neurological:  Positive for headaches.   Problems:  Patient Active Problem List   Diagnosis Date Noted   OSA (obstructive sleep apnea) 03/03/2022   Pressure sore 01/20/2022   SK (seborrheic keratosis) 01/20/2022   COVID-19 05/24/2021   Low back pain 04/23/2021   Infection of left ear 10/08/2020   High risk medication use 08/14/2020   Numbness 08/14/2020   Hyperglycemia 07/04/2020   Rash 04/04/2020   Impetigo 04/04/2020   Presence of implanted infusion pump 02/27/2020   Spastic diplegia (HCC) 02/27/2020   Right leg pain 07/10/2019   Hypersomnolence 01/16/2019   Left shoulder pain 08/23/2018   Skin lesion  of scalp 08/01/2018   Constipation 02/20/2017   Visual changes 11/13/2016   Right hand weakness 10/17/2016   Dysesthesia 06/02/2016   Erectile dysfunction 02/17/2016   Sun-damaged skin 02/17/2016   Gait disturbance 12/24/2015   Spasticity 12/24/2015   Urinary urgency 12/24/2015   Insomnia 12/24/2015   Dyspnea 10/10/2015   Leg edema, right 05/15/2015   Pedal edema 04/28/2015   Dermatitis 03/06/2014   Fatigue 10/15/2012   Hypotestosteronism 11/17/2011   Preventative health care 10/07/2011   GERD  (gastroesophageal reflux disease) 10/07/2011   Obesity    Depression with anxiety    Onychomycosis    Tinea pedis    Keloid scar    CAD (coronary artery disease)    Multiple sclerosis (HCC)    Hyperlipidemia    Hypothyroidism    HTN (hypertension)     Allergies: No Known Allergies Medications:  Current Outpatient Medications:    aspirin EC 81 MG tablet, Take 81 mg by mouth daily., Disp: , Rfl:    atorvastatin (LIPITOR) 40 MG tablet, Take 1 tablet (40 mg total) by mouth daily at 6 PM., Disp: 90 tablet, Rfl: 3   BACLOFEN IT, 1 Dose by Intrathecal route as directed. , Disp: , Rfl:    busPIRone (BUSPAR) 10 MG tablet, TAKE 1 TABLET THREE TIMES A DAY, Disp: 270 tablet, Rfl: 3   cetirizine (ZYRTEC) 10 MG tablet, Take 10 mg by mouth daily as needed for allergies., Disp: , Rfl:    clobetasol cream (TEMOVATE) 0.05 %, Apply topically 2 (two) times daily as needed., Disp: , Rfl:    clonazePAM (KLONOPIN) 1 MG tablet, Take 1 tablet (1 mg total) by mouth 4 (four) times daily., Disp: 360 tablet, Rfl: 1   Clonidine HCl POWD, by Does not apply route. 720mg /ml IT Baclofen. Total dose 157.89mg /day. Last pump refill 06/02/16, Disp: , Rfl:    clotrimazole-betamethasone (LOTRISONE) cream, Apply 1 application topically 2 (two) times daily as needed (anti-fungal)., Disp: 30 g, Rfl: 2   cyclobenzaprine (FLEXERIL) 10 MG tablet, TAKE 1 TABLET BY MOUTH THREE TIMES A DAY AS NEEDED FOR MUSCLE SPASMS, Disp: 90 tablet, Rfl: 11   dalfampridine 10 MG TB12, TAKE 1 TABLET EVERY 12 HOURS, Disp: 60 tablet, Rfl: 5   DULoxetine (CYMBALTA) 60 MG capsule, Take 1 capsule (60 mg total) by mouth 2 (two) times daily. Brand name only, Disp: 180 capsule, Rfl: 1   EPINEPHrine (ADRENALIN) 1 MG/ML SOLN, Inject into the skin., Disp: , Rfl:    famotidine (PEPCID) 40 MG tablet, TAKE 1 TABLET DAILY AT BEDTIME, Disp: 90 tablet, Rfl: 3   furosemide (LASIX) 20 MG tablet, 1 ta po daily x 5 days and then as needed for SOB, weight gain>3#, pedal  edema and can take a second tab daily as needed, Disp: 45 tablet, Rfl: 3   gabapentin (NEURONTIN) 600 MG tablet, Take 1 tablet (600 mg total) by mouth 4 (four) times daily., Disp: 360 tablet, Rfl: 1   ibuprofen (ADVIL,MOTRIN) 200 MG tablet, Take 200 mg by mouth every 6 (six) hours as needed for moderate pain., Disp: , Rfl:    ketoconazole (NIZORAL) 2 % shampoo, Apply topically., Disp: , Rfl:    lamoTRIgine (LAMICTAL) 100 MG tablet, Take 1 tablet (100 mg total) by mouth 2 (two) times daily. (Patient not taking: Reported on 04/07/2022), Disp: 60 tablet, Rfl: 5   levothyroxine (SYNTHROID) 137 MCG tablet, Take 1 tablet (137 mcg total) by mouth daily before breakfast., Disp: 90 tablet, Rfl: 1   methylphenidate (RITALIN)  10 MG tablet, Take 1 tablet (10 mg total) by mouth 4 (four) times daily., Disp: 120 tablet, Rfl: 0   natalizumab (TYSABRI) 300 MG/15ML injection, Inject 300 mg into the vein every 28 (twenty-eight) days. , Disp: , Rfl:    nitroGLYCERIN (NITROSTAT) 0.4 MG SL tablet, PLACE 1 TABLET UNDER THE TOUNGE EVERY 5 MINUTES AS NEEDED FOR CHEST PAIN (X 3 DOSES), Disp: 25 tablet, Rfl: 0   nystatin cream (MYCOSTATIN), Apply 1 application topically as directed., Disp: 30 g, Rfl: 2   olmesartan-hydrochlorothiazide (BENICAR HCT) 20-12.5 MG tablet, TAKE 1 TABLET DAILY, Disp: 90 tablet, Rfl: 3   omeprazole (PRILOSEC) 40 MG capsule, TAKE 1 CAPSULE DAILY, Disp: 90 capsule, Rfl: 3   potassium chloride SA (KLOR-CON M20) 20 MEQ tablet, TAKE 1 TABLET BY MOUTH EVERY DAY AS NEEDED WHEN TAKING A SECOND LASIX TAB ANY GIVEN DAY, Disp: 90 tablet, Rfl: 3   sucralfate (CARAFATE) 1 g tablet, TAKE 1 TABLET BY MOUTH 4 TIMES DAILY - WITH MEALS AND AT BEDTIME. TAKE TABLET OR LIQUID BUT NOT BOTH, Disp: 120 tablet, Rfl: 1   sucralfate (CARAFATE) 1 GM/10ML suspension, Take 10 mLs (1 g total) by mouth 4 (four) times daily -  with meals and at bedtime. Take liquid or tablets but not both, Disp: 420 mL, Rfl: 3   tamsulosin (FLOMAX) 0.4  MG CAPS capsule, TAKE 1 CAPSULE DAILY, Disp: 90 capsule, Rfl: 3  Observations/Objective: Patient is well-developed, well-nourished in no acute distress.  Resting comfortably  at home.  Head is normocephalic, atraumatic.  No labored breathing.  Speech is clear and coherent with logical content.  Patient is alert and oriented at baseline.  No cough noted  Assessment and Plan:  Keith Morrow in today with chief complaint of feels bad and Abdominal Pain   1. Positive self-administered antigen test for COVID-19  Take meds as prescribed 2. Use a cool mist humidifier especially during the winter months and when heat has been humid. 3. Use saline nose sprays frequently 4. Saline irrigations of the nose can be very helpful if done frequently.  * 4X daily for 1 week*  * Use of a nettie pot can be helpful with this. Follow directions with this* 5. Drink plenty of fluids 6. Keep thermostat turn down low 7.For any cough or congestion- mucines as needed 8. For fever or aces or pains- take tylenol or ibuprofen appropriate for age and weight.  * for fevers greater than 101 orally you may alternate ibuprofen and tylenol every  3 hours.   Meds ordered this encounter  Medications   nirmatrelvir/ritonavir EUA (PAXLOVID) 20 x 150 MG & 10 x  TABS    Sig: Take 3 tablets by mouth 2 (two) times daily for 5 days. (Take nirmatrelvir 150 mg two tablets twice daily for 5 days and ritonavir 100 mg one tablet twice daily for 5 days) Patient GFR is 77    Dispense:  30 tablet    Refill:  0    Order Specific Question:   Supervising Provider    Answer:   Eber Hong [[id number]]     Follow Up Instructions: I discussed the assessment and treatment plan with the patient. The patient was provided an opportunity to ask questions and all were answered. The patient agreed with the plan and demonstrated an understanding of the instructions.  A copy of instructions were sent to the patient via MyChart.  The  patient was advised to call back or seek an in-person evaluation if  the symptoms worsen or if the condition fails to improve as anticipated.  Time:  I spent 7 minutes with the patient via telehealth technology discussing the above problems/concerns.    Mary-Margaret Daphine Deutscher, FNP

## 2022-05-11 NOTE — Patient Instructions (Signed)
COVID-19: Quarantine and Isolation °Quarantine °If you were exposed °Quarantine and stay away from others when you have been in close contact with someone who has COVID-19. °Isolate °If you are sick or test positive °Isolate when you are sick or when you have COVID-19, even if you don't have symptoms. °When to stay home °Calculating quarantine °The date of your exposure is considered day 0. Day 1 is the first full day after your last contact with a person who has had COVID-19. Stay home and away from other people for at least 5 days. Learn why CDC updated guidance for the general public. °IF YOU were exposed to COVID-19 and are NOT  °up to dateIF YOU were exposed to COVID-19 and are NOT on COVID-19 vaccinations °Quarantine for at least 5 days °Stay home °Stay home and quarantine for at least 5 full days. °Wear a well-fitting mask if you must be around others in your home. °Do not travel. °Get tested °Even if you don't develop symptoms, get tested at least 5 days after you last had close contact with someone with COVID-19. °After quarantine °Watch for symptoms °Watch for symptoms until 10 days after you last had close contact with someone with COVID-19. °Avoid travel °It is best to avoid travel until a full 10 days after you last had close contact with someone with COVID-19. °If you develop symptoms °Isolate immediately and get tested. Continue to stay home until you know the results. Wear a well-fitting mask around others. °Take precautions until day 10 °Wear a well-fitting mask °Wear a well-fitting mask for 10 full days any time you are around others inside your home or in public. Do not go to places where you are unable to wear a well-fitting mask. °If you must travel during days 6-10, take precautions. °Avoid being around people who are more likely to get very sick from COVID-19. °IF YOU were exposed to COVID-19 and are  °up to dateIF YOU were exposed to COVID-19 and are on COVID-19 vaccinations °No  quarantine °You do not need to stay home unless you develop symptoms. °Get tested °Even if you don't develop symptoms, get tested at least 5 days after you last had close contact with someone with COVID-19. °Watch for symptoms °Watch for symptoms until 10 days after you last had close contact with someone with COVID-19. °If you develop symptoms °Isolate immediately and get tested. Continue to stay home until you know the results. Wear a well-fitting mask around others. °Take precautions until day 10 °Wear a well-fitting mask °Wear a well-fitting mask for 10 full days any time you are around others inside your home or in public. Do not go to places where you are unable to wear a well-fitting mask. °Take precautions if traveling °Avoid being around people who are more likely to get very sick from COVID-19. °IF YOU were exposed to COVID-19 and had confirmed COVID-19 within the past 90 days (you tested positive using a viral test) °No quarantine °You do not need to stay home unless you develop symptoms. °Watch for symptoms °Watch for symptoms until 10 days after you last had close contact with someone with COVID-19. °If you develop symptoms °Isolate immediately and get tested. Continue to stay home until you know the results. Wear a well-fitting mask around others. °Take precautions until day 10 °Wear a well-fitting mask °Wear a well-fitting mask for 10 full days any time you are around others inside your home or in public. Do not go to places where you are   unable to wear a well-fitting mask. °Take precautions if traveling °Avoid being around people who are more likely to get very sick from COVID-19. °Calculating isolation °Day 0 is your first day of symptoms or a positive viral test. Day 1 is the first full day after your symptoms developed or your test specimen was collected. If you have COVID-19 or have symptoms, isolate for at least 5 days. °IF YOU tested positive for COVID-19 or have symptoms, regardless of  vaccination status °Stay home for at least 5 days °Stay home for 5 days and isolate from others in your home. °Wear a well-fitting mask if you must be around others in your home. °Do not travel. °Ending isolation if you had symptoms °End isolation after 5 full days if you are fever-free for 24 hours (without the use of fever-reducing medication) and your symptoms are improving. °Ending isolation if you did NOT have symptoms °End isolation after at least 5 full days after your positive test. °If you got very sick from COVID-19 or have a weakened immune system °You should isolate for at least 10 days. Consult your doctor before ending isolation. °Take precautions until day 10 °Wear a well-fitting mask °Wear a well-fitting mask for 10 full days any time you are around others inside your home or in public. Do not go to places where you are unable to wear a well-fitting mask. °Do not travel °Do not travel until a full 10 days after your symptoms started or the date your positive test was taken if you had no symptoms. °Avoid being around people who are more likely to get very sick from COVID-19. °Definitions °Exposure °Contact with someone infected with SARS-CoV-2, the virus that causes COVID-19, in a way that increases the likelihood of getting infected with the virus. °Close contact °A close contact is someone who was less than 6 feet away from an infected person (laboratory-confirmed or a clinical diagnosis) for a cumulative total of 15 minutes or more over a 24-hour period. For example, three individual 5-minute exposures for a total of 15 minutes. People who are exposed to someone with COVID-19 after they completed at least 5 days of isolation are not considered close contacts. °Quarantine °Quarantine is a strategy used to prevent transmission of COVID-19 by keeping people who have been in close contact with someone with COVID-19 apart from others. °Who does not need to quarantine? °If you had close contact with  someone with COVID-19 and you are in one of the following groups, you do not need to quarantine. °You are up to date with your COVID-19 vaccines. °You had confirmed COVID-19 within the last 90 days (meaning you tested positive using a viral test). °If you are up to date with COVID-19 vaccines, you should wear a well-fitting mask around others for 10 days from the date of your last close contact with someone with COVID-19 (the date of last close contact is considered day 0). Get tested at least 5 days after you last had close contact with someone with COVID-19. If you test positive or develop COVID-19 symptoms, isolate from other people and follow recommendations in the Isolation section below. If you tested positive for COVID-19 with a viral test within the previous 90 days and subsequently recovered and remain without COVID-19 symptoms, you do not need to quarantine or get tested after close contact. You should wear a well-fitting mask around others for 10 days from the date of your last close contact with someone with COVID-19 (the date of last   close contact is considered day 0). If you have COVID-19 symptoms, get tested and isolate from other people and follow recommendations in the Isolation section below. °Who should quarantine? °If you come into close contact with someone with COVID-19, you should quarantine if you are not up to date on COVID-19 vaccines. This includes people who are not vaccinated. °What to do for quarantine °Stay home and away from other people for at least 5 days (day 0 through day 5) after your last contact with a person who has COVID-19. The date of your exposure is considered day 0. Wear a well-fitting mask when around others at home, if possible. °For 10 days after your last close contact with someone with COVID-19, watch for fever (100.4°F or greater), cough, shortness of breath, or other COVID-19 symptoms. °If you develop symptoms, get tested immediately and isolate until you receive  your test results. If you test positive, follow isolation recommendations. °If you do not develop symptoms, get tested at least 5 days after you last had close contact with someone with COVID-19. °If you test negative, you can leave your home, but continue to wear a well-fitting mask when around others at home and in public until 10 days after your last close contact with someone with COVID-19. °If you test positive, you should isolate for at least 5 days from the date of your positive test (if you do not have symptoms). If you do develop COVID-19 symptoms, isolate for at least 5 days from the date your symptoms began (the date the symptoms started is day 0). Follow recommendations in the isolation section below. °If you are unable to get a test 5 days after last close contact with someone with COVID-19, you can leave your home after day 5 if you have been without COVID-19 symptoms throughout the 5-day period. Wear a well-fitting mask for 10 days after your date of last close contact when around others at home and in public. °Avoid people who are have weakened immune systems or are more likely to get very sick from COVID-19, and nursing homes and other high-risk settings, until after at least 10 days. °If possible, stay away from people you live with, especially people who are at higher risk for getting very sick from COVID-19, as well as others outside your home throughout the full 10 days after your last close contact with someone with COVID-19. °If you are unable to quarantine, you should wear a well-fitting mask for 10 days when around others at home and in public. °If you are unable to wear a mask when around others, you should continue to quarantine for 10 days. Avoid people who have weakened immune systems or are more likely to get very sick from COVID-19, and nursing homes and other high-risk settings, until after at least 10 days. °See additional information about travel. °Do not go to places where you are  unable to wear a mask, such as restaurants and some gyms, and avoid eating around others at home and at work until after 10 days after your last close contact with someone with COVID-19. °After quarantine °Watch for symptoms until 10 days after your last close contact with someone with COVID-19. °If you have symptoms, isolate immediately and get tested. °Quarantine in high-risk congregate settings °In certain congregate settings that have high risk of secondary transmission (such as correctional and detention facilities, homeless shelters, or cruise ships), CDC recommends a 10-day quarantine for residents, regardless of vaccination and booster status. During periods of critical staffing   shortages, facilities may consider shortening the quarantine period for staff to ensure continuity of operations. Decisions to shorten quarantine in these settings should be made in consultation with state, local, tribal, or territorial health departments and should take into consideration the context and characteristics of the facility. CDC's setting-specific guidance provides additional recommendations for these settings. °Isolation °Isolation is used to separate people with confirmed or suspected COVID-19 from those without COVID-19. People who are in isolation should stay home until it's safe for them to be around others. At home, anyone sick or infected should separate from others, or wear a well-fitting mask when they need to be around others. People in isolation should stay in a specific "sick room" or area and use a separate bathroom if available. Everyone who has presumed or confirmed COVID-19 should stay home and isolate from other people for at least 5 full days (day 0 is the first day of symptoms or the date of the day of the positive viral test for asymptomatic persons). They should wear a mask when around others at home and in public for an additional 5 days. People who are confirmed to have COVID-19 or are showing  symptoms of COVID-19 need to isolate regardless of their vaccination status. This includes: °People who have a positive viral test for COVID-19, regardless of whether or not they have symptoms. °People with symptoms of COVID-19, including people who are awaiting test results or have not been tested. People with symptoms should isolate even if they do not know if they have been in close contact with someone with COVID-19. °What to do for isolation °Monitor your symptoms. If you have an emergency warning sign (including trouble breathing), seek emergency medical care immediately. °Stay in a separate room from other household members, if possible. °Use a separate bathroom, if possible. °Take steps to improve ventilation at home, if possible. °Avoid contact with other members of the household and pets. °Don't share personal household items, like cups, towels, and utensils. °Wear a well-fitting mask when you need to be around other people. °Learn more about what to do if you are sick and how to notify your contacts. °Ending isolation for people who had COVID-19 and had symptoms °If you had COVID-19 and had symptoms, isolate for at least 5 days. To calculate your 5-day isolation period, day 0 is your first day of symptoms. Day 1 is the first full day after your symptoms developed. You can leave isolation after 5 full days. °You can end isolation after 5 full days if you are fever-free for 24 hours without the use of fever-reducing medication and your other symptoms have improved (Loss of taste and smell may persist for weeks or months after recovery and need not delay the end of isolation). °You should continue to wear a well-fitting mask around others at home and in public for 5 additional days (day 6 through day 10) after the end of your 5-day isolation period. If you are unable to wear a mask when around others, you should continue to isolate for a full 10 days. Avoid people who have weakened immune systems or are more  likely to get very sick from COVID-19, and nursing homes and other high-risk settings, until after at least 10 days. °If you continue to have fever or your other symptoms have not improved after 5 days of isolation, you should wait to end your isolation until you are fever-free for 24 hours without the use of fever-reducing medication and your other symptoms have improved.   Continue to wear a well-fitting mask through day 10. Contact your healthcare provider if you have questions. °See additional information about travel. °Do not go to places where you are unable to wear a mask, such as restaurants and some gyms, and avoid eating around others at home and at work until a full 10 days after your first day of symptoms. °If an individual has access to a test and wants to test, the best approach is to use an antigen test1 towards the end of the 5-day isolation period. Collect the test sample only if you are fever-free for 24 hours without the use of fever-reducing medication and your other symptoms have improved (loss of taste and smell may persist for weeks or months after recovery and need not delay the end of isolation). If your test result is positive, you should continue to isolate until day 10. If your test result is negative, you can end isolation, but continue to wear a well-fitting mask around others at home and in public until day 10. Follow additional recommendations for masking and avoiding travel as described above. °1As noted in the labeling for authorized over-the counter antigen tests: Negative results should be treated as presumptive. Negative results do not rule out SARS-CoV-2 infection and should not be used as the sole basis for treatment or patient management decisions, including infection control decisions. To improve results, antigen tests should be used twice over a three-day period with at least 24 hours and no more than 48 hours between tests. °Note that these recommendations on ending isolation  do not apply to people who are moderately ill or very sick from COVID-19 or have weakened immune systems. See section below for recommendations for when to end isolation for these groups. °Ending isolation for people who tested positive for COVID-19 but had no symptoms °If you test positive for COVID-19 and never develop symptoms, isolate for at least 5 days. Day 0 is the day of your positive viral test (based on the date you were tested) and day 1 is the first full day after the specimen was collected for your positive test. You can leave isolation after 5 full days. °If you continue to have no symptoms, you can end isolation after at least 5 days. °You should continue to wear a well-fitting mask around others at home and in public until day 10 (day 6 through day 10). If you are unable to wear a mask when around others, you should continue to isolate for 10 days. Avoid people who have weakened immune systems or are more likely to get very sick from COVID-19, and nursing homes and other high-risk settings, until after at least 10 days. °If you develop symptoms after testing positive, your 5-day isolation period should start over. Day 0 is your first day of symptoms. Follow the recommendations above for ending isolation for people who had COVID-19 and had symptoms. °See additional information about travel. °Do not go to places where you are unable to wear a mask, such as restaurants and some gyms, and avoid eating around others at home and at work until 10 days after the day of your positive test. °If an individual has access to a test and wants to test, the best approach is to use an antigen test1 towards the end of the 5-day isolation period. If your test result is positive, you should continue to isolate until day 10. If your test result is positive, you can also choose to test daily and if your test result   is negative, you can end isolation, but continue to wear a well-fitting mask around others at home and in  public until day 10. Follow additional recommendations for masking and avoiding travel as described above. °1As noted in the labeling for authorized over-the counter antigen tests: Negative results should be treated as presumptive. Negative results do not rule out SARS-CoV-2 infection and should not be used as the sole basis for treatment or patient management decisions, including infection control decisions. To improve results, antigen tests should be used twice over a three-day period with at least 24 hours and no more than 48 hours between tests. °Ending isolation for people who were moderately or very sick from COVID-19 or have a weakened immune system °People who are moderately ill from COVID-19 (experiencing symptoms that affect the lungs like shortness of breath or difficulty breathing) should isolate for 10 days and follow all other isolation precautions. To calculate your 10-day isolation period, day 0 is your first day of symptoms. Day 1 is the first full day after your symptoms developed. If you are unsure if your symptoms are moderate, talk to a healthcare provider for further guidance. °People who are very sick from COVID-19 (this means people who were hospitalized or required intensive care or ventilation support) and people who have weakened immune systems might need to isolate at home longer. They may also require testing with a viral test to determine when they can be around others. CDC recommends an isolation period of at least 10 and up to 20 days for people who were very sick from COVID-19 and for people with weakened immune systems. Consult with your healthcare provider about when you can resume being around other people. If you are unsure if your symptoms are severe or if you have a weakened immune system, talk to a healthcare provider for further guidance. °People who have a weakened immune system should talk to their healthcare provider about the potential for reduced immune responses to  COVID-19 vaccines and the need to continue to follow current prevention measures (including wearing a well-fitting mask and avoiding crowds and poorly ventilated indoor spaces) to protect themselves against COVID-19 until advised otherwise by their healthcare provider. Close contacts of immunocompromised people--including household members--should also be encouraged to receive all recommended COVID-19 vaccine doses to help protect these people. °Isolation in high-risk congregate settings °In certain high-risk congregate settings that have high risk of secondary transmission and where it is not feasible to cohort people (such as correctional and detention facilities, homeless shelters, and cruise ships), CDC recommends a 10-day isolation period for residents. During periods of critical staffing shortages, facilities may consider shortening the isolation period for staff to ensure continuity of operations. Decisions to shorten isolation in these settings should be made in consultation with state, local, tribal, or territorial health departments and should take into consideration the context and characteristics of the facility. CDC's setting-specific guidance provides additional recommendations for these settings. °This CDC guidance is meant to supplement--not replace--any federal, state, local, territorial, or tribal health and safety laws, rules, and regulations. °Recommendations for specific settings °These recommendations do not apply to healthcare professionals. For guidance specific to these settings, see °Healthcare professionals: Interim Guidance for Managing Healthcare Personnel with SARS-CoV-2 Infection or Exposure to SARS-CoV-2 °Patients, residents, and visitors to healthcare settings: Interim Infection Prevention and Control Recommendations for Healthcare Personnel During the Coronavirus Disease 2019 (COVID-19) Pandemic °Additional setting-specific guidance and recommendations are available. °These  recommendations on quarantine and isolation do apply to K[employer]12 School   settings. Additional guidance is available here: Overview of COVID-19 Quarantine for K-12 Schools °Travelers: Travel information and recommendations °Congregate facilities and other settings: guidance pages for community, work, and school settings °Ongoing COVID-19 exposure FAQs °I live with someone with COVID-19, but I cannot be separated from them. How do we manage quarantine in this situation? °It is very important for people with COVID-19 to remain apart from other people, if possible, even if they are living together. If separation of the person with COVID-19 from others that they live with is not possible, the other people that they live with will have ongoing exposure, meaning they will be repeatedly exposed until that person is no longer able to spread the virus to other people. In this situation, there are precautions you can take to limit the spread of COVID-19: °The person with COVID-19 and everyone they live with should wear a well-fitting mask inside the home. °If possible, one person should care for the person with COVID-19 to limit the number of people who are in close contact with the infected person. °Take steps to protect yourself and others to reduce transmission in the home: °Quarantine if you are not up to date with your COVID-19 vaccines. °Isolate if you are sick or tested positive for COVID-19, even if you don't have symptoms. °Learn more about the public health recommendations for testing, mask use and quarantine of close contacts, like yourself, who have ongoing exposure. These recommendations differ depending on your vaccination status. °What should I do if I have ongoing exposure to COVID-19 from someone I live with? °Recommendations for this situation depend on your vaccination status: °If you are not up to date on COVID-19 vaccines and have ongoing exposure to COVID-19, you should: °Begin quarantine immediately and  continue to quarantine throughout the isolation period of the person with COVID-19. °Continue to quarantine for an additional 5 days starting the day after the end of isolation for the person with COVID-19. °Get tested at least 5 days after the end of isolation of the infected person that lives with them. °If you test negative, you can leave the home but should continue to wear a well-fitting mask when around others at home and in public until 10 days after the end of isolation for the person with COVID-19. °Isolate immediately if you develop symptoms of COVID-19 or test positive. °If you are up to date with COVID-19 vaccines and have ongoing exposure to COVID-19, you should: °Get tested at least 5 days after your first exposure. A person with COVID-19 is considered infectious starting 2 days before they develop symptoms, or 2 days before the date of their positive test if they do not have symptoms. °Get tested again at least 5 days after the end of isolation for the person with COVID-19. °Wear a well-fitting mask when you are around the person with COVID-19, and do this throughout their isolation period. °Wear a well-fitting mask around others for 10 days after the infected person's isolation period ends. °Isolate immediately if you develop symptoms of COVID-19 or test positive. °What should I do if multiple people I live with test positive for COVID-19 at different times? °Recommendations for this situation depend on your vaccination status: °If you are not up to date with your COVID-19 vaccines, you should: °Quarantine throughout the isolation period of any infected person that you live with. °Continue to quarantine until 5 days after the end of isolation date for the most recently infected person that lives with you. For example, if   the last day of isolation of the person most recently infected with COVID-19 was June 30, the new 5-day quarantine period starts on July 1. °Get tested at least 5 days after the end  of isolation for the most recently infected person that lives with you. °Wear a well-fitting mask when you are around any person with COVID-19 while that person is in isolation. °Wear a well-fitting mask when you are around other people until 10 days after your last close contact. °Isolate immediately if you develop symptoms of COVID-19 or test positive. °If you are up to date with your COVID-19 vaccines, you should: °Get tested at least 5 days after your first exposure. A person with COVID-19 is considered infectious starting 2 days before they developed symptoms, or 2 days before the date of their positive test if they do not have symptoms. °Get tested again at least 5 days after the end of isolation for the most recently infected person that lives with you. °Wear a well-fitting mask when you are around any person with COVID-19 while that person is in isolation. °Wear a well-fitting mask around others for 10 days after the end of isolation for the most recently infected person that lives with you. For example, if the last day of isolation for the person most recently infected with COVID-19 was June 30, the new 10-day period to wear a well-fitting mask indoors in public starts on July 1. °Isolate immediately if you develop symptoms of COVID-19 or test positive. °I had COVID-19 and completed isolation. Do I have to quarantine or get tested if someone I live with gets COVID-19 shortly after I completed isolation? °No. If you recently completed isolation and someone that lives with you tests positive for the virus that causes COVID-19 shortly after the end of your isolation period, you do not have to quarantine or get tested as long as you do not develop new symptoms. Once all of the people that live together have completed isolation or quarantine, refer to the guidance below for new exposures to COVID-19. °If you had COVID-19 in the previous 90 days and then came into close contact with someone with COVID-19, you do  not have to quarantine or get tested if you do not have symptoms. But you should: °Wear a well-fitting mask indoors in public for 10 days after your last close contact. °Monitor for COVID-19 symptoms for 10 days from the date of your last close contact. °Isolate immediately and get tested if symptoms develop. °If more than 90 days have passed since your recovery from infection, follow CDC's recommendations for close contacts. These recommendations will differ depending on your vaccination status. °03/06/2021 °Content source: National Center for Immunization and Respiratory Diseases (NCIRD), Division of Viral Diseases °This information is not intended to replace advice given to you by your health care provider. Make sure you discuss any questions you have with your health care provider. °Document Revised: 07/10/2021 Document Reviewed: 07/10/2021 °Elsevier Patient Education © 2022 Elsevier Inc. ° °

## 2022-05-13 ENCOUNTER — Other Ambulatory Visit: Payer: Self-pay | Admitting: Neurology

## 2022-05-13 MED ORDER — METHYLPHENIDATE HCL 10 MG PO TABS: 10.0000 mg | ORAL_TABLET | Freq: Four times a day (QID) | ORAL | 0 refills | Status: DC

## 2022-05-13 NOTE — Telephone Encounter (Signed)
Last OV was on 02/25/22.  Next OV is scheduled for 09/04/22.  Last RX was written on 01/27/22 for 120 tabs.   [facility] Database has been reviewed.

## 2022-05-15 ENCOUNTER — Encounter: Payer: Self-pay | Admitting: Neurology

## 2022-05-15 ENCOUNTER — Ambulatory Visit: Payer: Managed Care, Other (non HMO) | Admitting: Podiatry

## 2022-05-15 ENCOUNTER — Telehealth: Payer: Self-pay | Admitting: *Deleted

## 2022-05-15 NOTE — Telephone Encounter (Signed)
Faxed signed orders back to [facility] for [facility] at [phone]. Received fax confirmation.  

## 2022-05-19 ENCOUNTER — Encounter: Payer: Self-pay | Admitting: Neurology

## 2022-05-19 ENCOUNTER — Ambulatory Visit (INDEPENDENT_AMBULATORY_CARE_PROVIDER_SITE_OTHER): Payer: Managed Care, Other (non HMO) | Admitting: Neurology

## 2022-05-19 ENCOUNTER — Ambulatory Visit: Payer: Managed Care, Other (non HMO)

## 2022-05-19 VITALS — Ht 75.0 in

## 2022-05-19 DIAGNOSIS — L97511 Non-pressure chronic ulcer of other part of right foot limited to breakdown of skin: Secondary | ICD-10-CM

## 2022-05-19 DIAGNOSIS — L84 Corns and callosities: Secondary | ICD-10-CM

## 2022-05-19 DIAGNOSIS — G35 Multiple sclerosis: Secondary | ICD-10-CM

## 2022-05-19 DIAGNOSIS — G801 Spastic diplegic cerebral palsy: Secondary | ICD-10-CM
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
## 2022-05-19 NOTE — Progress Notes (Unsigned)
Subjective:    Patient ID: Keith Morrow, male    DOB: August 30, 1971, 51 y.o.   MRN: [id number]  No chief complaint on file.   HPI Patient is in today for a follow up.  Past Medical History:  Diagnosis Date   Acute bronchitis 05/09/2013   Allergy    Bronchitis, acute 12/10/2011   Candidal skin infection 05/05/2017   Chicken pox as a child   Constipation 02/20/2017   Coronary atherosclerosis of native coronary artery    a. NSTEMI 04/20/11: tx with Promus DES to LAD; bifurcation lesion with oDx 90% tx with POBA; b. staged PCI of right post. AV Branch with Promus DES; c. 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   Visual changes 11/13/2016    Past Surgical History:  Procedure Laterality Date   CORONARY ANGIOPLASTY WITH STENT PLACEMENT     2012   PAIN PUMP IMPLANTATION N/A 08/29/2014   Procedure: Baclofen pump placement;  Surgeon: Erline Levine, MD;  Location: [facility];  Service: Neurosurgery;  Laterality: N/A;  Baclofen pump placement   PAIN PUMP IMPLANTATION Right 09/20/2020   Procedure: Right Baclofen pump replacement;  Surgeon: Erline Levine, MD;  Location: [facility];  Service: Neurosurgery;  Laterality: Right;  Right Baclofen pump replacement   PROGRAMABLE BACLOFEN PUMP REVISION  08/29/14   right wrist surgery     CTR   VASCULAR SURGERY     vericose vein in right femoral area   VASECTOMY     WISDOM TOOTH EXTRACTION       Family History  Problem Relation Age of Onset   Heart attack Mother 50   Thyroid disease Mother    Heart disease Mother    Thyroid disease Sister    Thyroid disease Brother    Heart disease Maternal Grandmother    Hypertension Maternal Grandmother    Hyperlipidemia Maternal Grandmother    Heart disease Maternal Grandfather    Hypertension Maternal Grandfather    Hyperlipidemia Maternal Grandfather    Stroke Paternal Grandfather    Thyroid disease Sister    Heart disease Father        2 stents   Coronary artery disease Other        questionable in father    Social History   Socioeconomic History   Marital status: Married    Spouse name: Not on file   Number of children: 3   Years of education: Not on file   Highest education level: Not on file  Occupational History   Not on file  Tobacco Use   Smoking status: Never   Smokeless tobacco: Never  Vaping Use   Vaping Use: Never used  Substance and Sexual Activity   Alcohol use: Yes    Comment: socially   Drug use: No   Sexual activity: Yes  Partners: Female    Comment: 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, works Transport planner, no dietary restrictions  Other Topics Concern   Not on file  Social History Narrative   Not on file   Social Determinants of Health   Financial Resource Strain: Not on file  Food Insecurity: Not on file  Transportation Needs: Not on file  Physical Activity: Not on file  Stress: Not on file  Social Connections: Not on file  Intimate Partner Violence: Not on file    Outpatient Medications Prior to Visit  Medication Sig Dispense Refill   aspirin EC 81 MG tablet Take 81 mg by mouth daily.     atorvastatin (LIPITOR) 40 MG tablet Take 1 tablet (40 mg total) by mouth daily at 6 PM. 90 tablet 3   BACLOFEN IT 1 Dose by Intrathecal route as directed.      busPIRone (BUSPAR) 10 MG tablet TAKE 1 TABLET THREE TIMES A DAY 270 tablet 3   cetirizine (ZYRTEC) 10 MG tablet Take 10 mg by mouth daily  as needed for allergies.     clobetasol cream (TEMOVATE) 0.05 % Apply topically 2 (two) times daily as needed.     clonazePAM (KLONOPIN) 1 MG tablet Take 1 tablet (1 mg total) by mouth 4 (four) times daily. 360 tablet 1   Clonidine HCl POWD by Does not apply route. 72m/ml IT Baclofen. Total dose 157.840mday. Last pump refill 06/02/16     clotrimazole-betamethasone (LOTRISONE) cream Apply 1 application topically 2 (two) times daily as needed (anti-fungal). 30 g 2   cyclobenzaprine (FLEXERIL) 10 MG tablet TAKE 1 TABLET BY MOUTH THREE TIMES A DAY AS NEEDED FOR MUSCLE SPASMS 90 tablet 11   dalfampridine 10 MG TB12 TAKE 1 TABLET EVERY 12 HOURS 60 tablet 5   DULoxetine (CYMBALTA) 60 MG capsule Take 1 capsule (60 mg total) by mouth 2 (two) times daily. Brand name only 180 capsule 1   EPINEPHrine (ADRENALIN) 1 MG/ML SOLN Inject into the skin.     famotidine (PEPCID) 40 MG tablet TAKE 1 TABLET DAILY AT BEDTIME 90 tablet 3   furosemide (LASIX) 20 MG tablet 1 ta po daily x 5 days and then as needed for SOB, weight gain>3#, pedal edema and can take a second tab daily as needed 45 tablet 3   gabapentin (NEURONTIN) 600 MG tablet Take 1 tablet (600 mg total) by mouth 4 (four) times daily. 360 tablet 1   ibuprofen (ADVIL,MOTRIN) 200 MG tablet Take 200 mg by mouth every 6 (six) hours as needed for moderate pain.     ketoconazole (NIZORAL) 2 % shampoo Apply topically.     lamoTRIgine (LAMICTAL) 100 MG tablet Take 1 tablet (100 mg total) by mouth 2 (two) times daily. (Patient not taking: Reported on 04/07/2022) 60 tablet 5   levothyroxine (SYNTHROID) 137 MCG tablet Take 1 tablet (137 mcg total) by mouth daily before breakfast. 90 tablet 1   methylphenidate (RITALIN) 10 MG tablet Take 1 tablet (10 mg total) by mouth 4 (four) times daily. 120 tablet 0   natalizumab (TYSABRI) 300 MG/15ML injection Inject 300 mg into the vein every 28 (twenty-eight) days.      nitroGLYCERIN (NITROSTAT) 0.4 MG SL tablet PLACE 1 TABLET  UNDER THE TOUNGE EVERY 5 MINUTES AS NEEDED FOR CHEST PAIN (X 3 DOSES) 25 tablet 0   nystatin cream (MYCOSTATIN) Apply 1 application topically as directed. 30 g 2   olmesartan-hydrochlorothiazide (BENICAR HCT) 20-12.5 MG tablet TAKE 1 TABLET DAILY 90 tablet 3  omeprazole (PRILOSEC) 40 MG capsule TAKE 1 CAPSULE DAILY 90 capsule 3   potassium chloride SA (KLOR-CON M20) 20 MEQ tablet TAKE 1 TABLET BY MOUTH EVERY DAY AS NEEDED WHEN TAKING A SECOND LASIX TAB ANY GIVEN DAY 90 tablet 3   sucralfate (CARAFATE) 1 g tablet TAKE 1 TABLET BY MOUTH 4 TIMES DAILY - WITH MEALS AND AT BEDTIME. TAKE TABLET OR LIQUID BUT NOT BOTH 120 tablet 1   sucralfate (CARAFATE) 1 GM/10ML suspension Take 10 mLs (1 g total) by mouth 4 (four) times daily -  with meals and at bedtime. Take liquid or tablets but not both 420 mL 3   tamsulosin (FLOMAX) 0.4 MG CAPS capsule TAKE 1 CAPSULE DAILY 90 capsule 3   No facility-administered medications prior to visit.    No Known Allergies  ROS     Objective:    Physical Exam  There were no vitals taken for this visit. Wt Readings from Last 3 Encounters:  04/07/22 (!) 309 lb (140.2 kg)  03/03/22 (!) 310 lb (140.6 kg)  01/16/22 (!) 306 lb 12.8 oz (139.2 kg)    Diabetic Foot Exam - Simple   No data filed    Lab Results  Component Value Date   WBC 7.0 02/25/2022   HGB 13.5 02/25/2022   HCT 40.5 02/25/2022   PLT 212 02/25/2022   GLUCOSE 104 (H) 01/16/2022   CHOL 137 01/16/2022   TRIG 125.0 01/16/2022   HDL 49.20 01/16/2022   LDLCALC 63 01/16/2022   ALT 16 01/16/2022   AST 13 01/16/2022   NA 140 01/16/2022   K 3.8 01/16/2022   CL 101 01/16/2022   CREATININE 0.92 01/16/2022   BUN 17 01/16/2022   CO2 32 01/16/2022   TSH 0.74 01/16/2022   PSA 0.52 02/11/2016   INR 1.00 04/21/2011   HGBA1C 5.8 01/16/2022    Lab Results  Component Value Date   TSH 0.74 01/16/2022   Lab Results  Component Value Date   WBC 7.0 02/25/2022   HGB 13.5 02/25/2022   HCT 40.5  02/25/2022   MCV 86 02/25/2022   PLT 212 02/25/2022   Lab Results  Component Value Date   NA 140 01/16/2022   K 3.8 01/16/2022   CO2 32 01/16/2022   GLUCOSE 104 (H) 01/16/2022   BUN 17 01/16/2022   CREATININE 0.92 01/16/2022   BILITOT 0.6 01/16/2022   ALKPHOS 76 01/16/2022   AST 13 01/16/2022   ALT 16 01/16/2022   PROT 6.6 01/16/2022   ALBUMIN 4.2 01/16/2022   CALCIUM 9.2 01/16/2022   ANIONGAP 10 09/18/2020   EGFR 105 04/02/2021   GFR 98.09 01/16/2022   Lab Results  Component Value Date   CHOL 137 01/16/2022   Lab Results  Component Value Date   HDL 49.20 01/16/2022   Lab Results  Component Value Date   LDLCALC 63 01/16/2022   Lab Results  Component Value Date   TRIG 125.0 01/16/2022   Lab Results  Component Value Date   CHOLHDL 3 01/16/2022   Lab Results  Component Value Date   HGBA1C 5.8 01/16/2022       Assessment & Plan:   COLONOSCOPY: PAP: PSA: DEXA:   Problem List Items Addressed This Visit   None   I am having Keith Morrow maintain his cetirizine, aspirin EC, ibuprofen, BACLOFEN IT, natalizumab, Clonidine HCl, clotrimazole-betamethasone, nitroGLYCERIN, nystatin cream, ketoconazole, furosemide, potassium chloride SA, clobetasol cream, sucralfate, sucralfate, olmesartan-hydrochlorothiazide, levothyroxine, lamoTRIgine, cyclobenzaprine, DULoxetine, clonazePAM, gabapentin, tamsulosin, famotidine, dalfampridine,  EPINEPHrine, atorvastatin, busPIRone, omeprazole, and methylphenidate.  No orders of the defined types were placed in this encounter.

## 2022-05-19 NOTE — Progress Notes (Signed)
SITUATION Reason for Consult: Follow-up with foot orthotics Patient / Caregiver Report: Patient reports pressure in spring ligament  OBJECTIVE DATA History / Diagnosis:    ICD-10-CM   1. Skin ulcer of plantar aspect of right foot, limited to breakdown of skin (HCC)  L97.511     2. Pre-ulcerative calluses  L84       Change in Pathology: None  ACTIONS PERFORMED Patient's equipment was checked for structural stability and fit. Reduced heel posting at spring ligament bilateral. Device(s) intact and fit is excellent. All questions answered and concerns addressed.  PLAN Follow-up as needed (PRN). Plan of care discussed with and agreed upon by patient / caregiver.

## 2022-05-20 ENCOUNTER — Encounter: Payer: Self-pay | Admitting: Family Medicine

## 2022-05-20 ENCOUNTER — Ambulatory Visit (INDEPENDENT_AMBULATORY_CARE_PROVIDER_SITE_OTHER): Payer: Managed Care, Other (non HMO) | Admitting: Family Medicine

## 2022-05-20 VITALS — BP 110/70 | HR 79 | Resp 20 | Ht 75.0 in | Wt 309.0 lb

## 2022-05-20 DIAGNOSIS — E349 Endocrine disorder, unspecified: Secondary | ICD-10-CM

## 2022-05-20 DIAGNOSIS — E039 Hypothyroidism, unspecified: Secondary | ICD-10-CM

## 2022-05-20 DIAGNOSIS — R739 Hyperglycemia, unspecified: Secondary | ICD-10-CM | POA: Diagnosis not present

## 2022-05-20 DIAGNOSIS — I10 Essential (primary) hypertension: Secondary | ICD-10-CM

## 2022-05-20 DIAGNOSIS — E785 Hyperlipidemia, unspecified: Secondary | ICD-10-CM | POA: Diagnosis not present

## 2022-05-20 DIAGNOSIS — Z8616 Personal history of COVID-19: Secondary | ICD-10-CM

## 2022-05-20 MED ORDER — METHYLPREDNISOLONE 4 MG PO TABS: ORAL_TABLET | ORAL | 1 refills | Status: DC

## 2022-05-20 MED ORDER — SAXENDA 18 MG/3ML ~~LOC~~ SOPN: PEN_INJECTOR | SUBCUTANEOUS | 0 refills | Status: AC

## 2022-05-20 MED ORDER — NYSTATIN 100000 UNIT/GM EX CREA: 1.0000 | TOPICAL_CREAM | CUTANEOUS | 2 refills | Status: DC

## 2022-05-20 NOTE — Patient Instructions (Addendum)
[id number] (weekly) or Saxenda (daily) for weigh loss    Poison Ivy Dermatitis Poison ivy dermatitis is inflammation of the skin that is caused by chemicals in the leaves of the poison ivy plant. The skin reaction often involves redness, swelling, blisters, and extreme itching. What are the causes? This condition is caused by a chemical (urushiol) found in the sap of the poison ivy plant. This chemical is sticky and can be easily spread to people, animals, and objects. You can get poison ivy dermatitis by: Having direct contact with a poison ivy plant. Touching animals, other people, or objects that have come in contact with poison ivy and have the chemical on them. What increases the risk? This condition is more likely to develop in people who: Are outdoors often in wooded or [place] areas. Go outdoors without wearing protective clothing, such as closed shoes, long pants, and a long-sleeved shirt. What are the signs or symptoms? Symptoms of this condition include: Redness of the skin. Extreme itching. A rash that often includes bumps and blisters. The rash usually appears 48 hours after exposure, if you have been exposed before. If this is the first time you have been exposed, the rash may not appear until a week after exposure. Swelling. This may occur if the reaction is more severe. Symptoms usually last for 1-2 weeks. However, the first time you develop this condition, symptoms may last 3-4 weeks. How is this diagnosed? This condition may be diagnosed based on your symptoms and a physical exam. Your health care provider may also ask you about any recent outdoor activity. How is this treated? Treatment for this condition will vary depending on how severe it is. Treatment may include: Hydrocortisone cream or calamine lotion to relieve itching. Oatmeal baths to soothe the skin. Medicines, such as over-the-counter antihistamine tablets. Oral steroid medicine, for more severe reactions. Follow  these instructions at home: Medicines Take or apply over-the-counter and prescription medicines only as told by your health care provider. Use hydrocortisone cream or calamine lotion as needed to soothe the skin and relieve itching. General instructions Do not scratch or rub your skin. Apply a cold, wet cloth (cold compress) to the affected areas or take baths in cool water. This will help with itching. Avoid hot baths and showers. Take oatmeal baths as needed. Use colloidal oatmeal. You can get this at your local pharmacy or grocery store. Follow the instructions on the packaging. While you have the rash, wash clothes right after you wear them. Keep all follow-up visits as told by your health care provider. This is important. How is this prevented?  Learn to identify the poison ivy plant and avoid contact with the plant. This plant can be recognized by the number of leaves. Generally, poison ivy has three leaves with flowering branches on a single stem. The leaves are typically glossy, and they have jagged edges that come to a point at the front. If you have been exposed to poison ivy, thoroughly wash with soap and water right away. You have about 30 minutes to remove the plant resin before it will cause the rash. Be sure to wash under your fingernails, because any plant resin there will continue to spread the rash. When hiking or camping, wear clothes that will help you to avoid exposure on the skin. This includes long pants, a long-sleeved shirt, tall socks, and hiking boots. You can also apply preventive lotion to your skin to help limit exposure. If you suspect that your clothes or outdoor  gear came in contact with poison ivy, rinse them off outside with a garden hose before you bring them inside your house. When doing yard work or gardening, wear gloves, long sleeves, long pants, and boots. Wash your garden tools and gloves if they come in contact with poison ivy. If you suspect that your pet  has come into contact with poison ivy, wash him or her with pet shampoo and water. Make sure to wear gloves while washing your pet. Contact a health care provider if you have: Open sores in the rash area. More redness, swelling, or pain in the affected area. Redness that spreads beyond the rash area. Fluid, blood, or pus coming from the affected area. A fever. A rash over a large area of your body. A rash on your eyes, mouth, or genitals. A rash that does not improve after a few weeks. Get help right away if: Your face swells or your eyes swell shut. You have trouble breathing. You have trouble swallowing. These symptoms may represent a serious problem that is an emergency. Do not wait to see if the symptoms will go away. Get medical help right away. Call your local emergency services (911 in the [place] Do not drive yourself to the hospital. Summary Poison ivy dermatitis is inflammation of the skin that is caused by chemicals in the leaves of the poison ivy plant. Symptoms of this condition include redness, itching, a rash, and swelling. Do not scratch or rub your skin. Take or apply over-the-counter and prescription medicines only as told by your health care provider. This information is not intended to replace advice given to you by your health care provider. Make sure you discuss any questions you have with your health care provider. Document Revised: 09/09/2021 Document Reviewed: 09/09/2021 Elsevier Patient Education  [employer].

## 2022-05-21 DIAGNOSIS — Z8616 Personal history of COVID-19: Secondary | ICD-10-CM | POA: Insufficient documentation

## 2022-05-21 LAB — CBC
HCT: 40.6 % (ref 39.0–52.0)
Hemoglobin: 13.4 g/dL (ref 13.0–17.0)
MCHC: 33.1 g/dL (ref 30.0–36.0)
MCV: 87.5 fl (ref 78.0–100.0)
Platelets: 207 10*3/uL (ref 150.0–400.0)
RBC: 4.64 Mil/uL (ref 4.22–5.81)
RDW: 14.6 % (ref 11.5–15.5)
WBC: 7.6 10*3/uL (ref 4.0–10.5)

## 2022-05-21 LAB — LIPID PANEL
Cholesterol: 133 mg/dL (ref 0–200)
HDL: 44.9 mg/dL (ref >39.00)
LDL Cholesterol: 73 mg/dL (ref 0–99)
NonHDL: 88.4
Total CHOL/HDL Ratio: 3
Triglycerides: 76 mg/dL (ref 0.0–149.0)
VLDL: 15.2 mg/dL (ref 0.0–40.0)

## 2022-05-21 LAB — COMPREHENSIVE METABOLIC PANEL
ALT: 26 U/L (ref 0–53)
AST: 18 U/L (ref 0–37)
Albumin: 4.2 g/dL (ref 3.5–5.2)
Alkaline Phosphatase: 82 U/L (ref 39–117)
BUN: 15 mg/dL (ref 6–23)
CO2: 34 mEq/L — ABNORMAL HIGH (ref 19–32)
Calcium: 9.3 mg/dL (ref 8.4–10.5)
Chloride: 100 mEq/L (ref 96–112)
Creatinine, Ser: 0.9 mg/dL (ref 0.40–1.50)
GFR: 100.48 mL/min (ref >60.00)
Glucose, Bld: 81 mg/dL (ref 70–99)
Potassium: 3.8 mEq/L (ref 3.5–5.1)
Sodium: 140 mEq/L (ref 135–145)
Total Bilirubin: 0.6 mg/dL (ref 0.2–1.2)
Total Protein: 6.7 g/dL (ref 6.0–8.3)

## 2022-05-21 LAB — HEMOGLOBIN A1C: Hgb A1c MFr Bld: 5.6 % (ref 4.6–6.5)

## 2022-05-21 LAB — MAGNESIUM: Magnesium: 2.1 mg/dL (ref 1.5–2.5)

## 2022-05-21 LAB — TSH: TSH: 0.99 u[IU]/mL (ref 0.35–5.50)

## 2022-05-21 NOTE — Assessment & Plan Note (Signed)
He feels fully recovered. His family with ill this past month but they are doing much better.

## 2022-05-21 NOTE — Assessment & Plan Note (Signed)
Well controlled, no changes to meds. Encouraged heart healthy diet such as the DASH diet and exercise as tolerated.  °

## 2022-05-21 NOTE — Assessment & Plan Note (Signed)
hgba1c acceptable, minimize simple carbs. Increase exercise as tolerated.  

## 2022-05-21 NOTE — Assessment & Plan Note (Signed)
On Levothyroxine, continue to monitor 

## 2022-05-21 NOTE — Assessment & Plan Note (Signed)
Encourage heart healthy diet such as MIND or DASH diet, increase exercise, avoid trans fats, simple carbohydrates and processed foods, consider a krill or fish or flaxseed oil cap daily. Tolerating Atorvastatin 

## 2022-05-21 NOTE — Assessment & Plan Note (Signed)
Discussed surveillance but since we are not currently treating with not recheck today

## 2022-05-23 ENCOUNTER — Telehealth: Payer: Self-pay

## 2022-05-23 NOTE — Telephone Encounter (Signed)
Received call from [facility]- regarding Saxenda- clarification on directions given- verbal for 90 day supply and 1 refill also given.

## 2022-05-28 ENCOUNTER — Ambulatory Visit (INDEPENDENT_AMBULATORY_CARE_PROVIDER_SITE_OTHER): Payer: Managed Care, Other (non HMO) | Admitting: Podiatry

## 2022-05-28 ENCOUNTER — Encounter: Payer: Self-pay | Admitting: Podiatry

## 2022-05-28 DIAGNOSIS — M79674 Pain in right toe(s): Secondary | ICD-10-CM | POA: Diagnosis not present

## 2022-05-28 DIAGNOSIS — G35 Multiple sclerosis: Secondary | ICD-10-CM | POA: Diagnosis not present

## 2022-05-28 DIAGNOSIS — B351 Tinea unguium: Secondary | ICD-10-CM | POA: Diagnosis not present

## 2022-05-28 DIAGNOSIS — M79675 Pain in left toe(s): Secondary | ICD-10-CM

## 2022-05-28 DIAGNOSIS — L84 Corns and callosities: Secondary | ICD-10-CM

## 2022-05-28 NOTE — Progress Notes (Signed)
  Subjective:  Patient ID: Keith Morrow, male    DOB: [birth year],   MRN: [id number]  Chief Complaint  Patient presents with   Nail Problem     Routine nail care    51 y.o. male presents for  for follow-up of pressure sores to bilateral feet .  He is wheelchair bound with MS . Relates the areas are doing much better. Requesting to have nails trimmed as well today. States he often cuts his feet when he tries and relates numbness in his feet.   Denies any other pedal complaints. Denies n/v/f/c.   Past Medical History:  Diagnosis Date   Acute bronchitis 05/09/2013   Allergy    Bronchitis, acute 12/10/2011   Candidal skin infection 05/05/2017   Chicken pox as a child   Constipation 02/20/2017   Coronary atherosclerosis of native coronary artery    a. NSTEMI 04/20/11: tx with Promus DES to LAD; bifurcation lesion with oDx 90% tx with POBA; b. staged PCI of right post. AV Branch with Promus DES; c. 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 CFT <3 seconds. Normal hair growth on digits. No edema.  Skin. No lacerations or abrasions bilateral feet. Bilateral hyperkeratotic tissue to plantar fifth metatarsal. Healed no issues. Nails 1-5 b/l are thickened and elongated with subungual debris. Right hallux nail has been avulsed with some  maceration around healing nail bed.  Musculoskeletal: MMT 5/5 bilateral lower extremities in DF, PF, Inversion and Eversion. Deceased ROM in DF of ankle joint.  Neurological: Sensation intact to light touch.   Assessment:   1. Multiple sclerosis (HCC)   2. Pre-ulcerative calluses   3. Pain due to onychomycosis of toenails of both feet        Plan:  Patient was evaluated and treated and all questions answered. Ulcer right plantar foot limited to breakdown of skin- healed   -No debridement necessary of hyperkeratosis  -Off-loading with padding and discussed keeping pressure off of these area at all times.  -Discussed and educated patient on  foot care, especially with  regards to the vascular, neurological and musculoskeletal systems.  -Stressed the importance of good glycemic control and the detriment of not  controlling glucose levels in relation to the foot. -Discussed supportive shoes at all times and checking feet regularly.  -Mechanically debrided all nails 1-5 bilateral using sterile nail nipper and filed with dremel without incident  -Answered all patient questions -Patient to return  in 3 months for at risk foot care -Patient advised to call the office if any problems or questions arise in the meantime.     Return in about 3 months (around 08/28/2022) for rfc.   Louann Sjogren, DPM

## 2022-05-29 ENCOUNTER — Ambulatory Visit (INDEPENDENT_AMBULATORY_CARE_PROVIDER_SITE_OTHER): Payer: 59 | Admitting: Psychology

## 2022-05-29 DIAGNOSIS — F331 Major depressive disorder, recurrent, moderate: Secondary | ICD-10-CM | POA: Diagnosis not present

## 2022-05-29 NOTE — Progress Notes (Signed)
[facility] Counselor/Therapist Progress Note  Patient ID: Keith Morrow, MRN: [id number],    Date: 05/29/2022  Time Spent: 3:00pm-3:45pm   45 minutes   Treatment Type: Individual Therapy  Reported Symptoms: stress  Mental Status Exam: Appearance:  Casual     Behavior: Appropriate  Motor: Normal  Speech/Language:  Normal Rate  Affect: Appropriate  Mood: normal  Thought process: normal  Thought content:   WNL  Sensory/Perceptual disturbances:   WNL  Orientation: oriented to person, place, time/date, and situation  Attention: Good  Concentration: Good  Memory: WNL  Fund of knowledge:  Good  Insight:   Good  Judgment:  Good  Impulse Control: Good   Risk Assessment: Danger to Self:  No Self-injurious Behavior: No Danger to Others: No Duty to Warn:no Physical Aggression / Violence:No  Access to Firearms a concern: No  Gang Involvement:No   Subjective:  Pt present for face-to-face individual therapy via video Webex.  Pt consents to telehealth video session due to COVID 19 pandemic. Location of pt: home Location of therapist: home office.   Pt talked about work.  Pt has to travel for work next week and [name] will travel with him to help pt with mobility.  Work has been very busy. Addressed how pt can manage the overwhelm and worked on [employer]ician, dispensing.   Pt talked about his wife, her mother and [name] traveling and being in [name] now.   Preparing for the trip was very stressful.  [name] was challenging to deal with.   Now that the girls are gone pt is enjoying spending time with [name].  Pt talked about his relationship with [name].   She is very difficult to deal with and pt gets very frustrated.  [name] does not treat her family very well.  [name] dates girls but pt and wife are ok with that.   Pt talked about his physical deficits.   He feels like things have been worse.  Addressed pt's functional concerns.   Provided supportive therapy.   Interventions:  Cognitive Behavioral Therapy and Insight-Oriented  Diagnosis: F33.1  Plan of Care: Recommend ongoing therapy.   Plan to meet every two weeks.   Pt is progressing toward goals.    Treatment Plan  (Treatment Plan Target Date: 03/27/2023) Client Abilities/Strengths  Pt is bright, engaging, and motivated for therapy.   Client Treatment Preferences  Individual therapy.  Client Statement of Needs  Improve coping skills.  Symptoms  Depressed or irritable mood. Feelings of hopelessness, worthlessness, or inappropriate guilt. Low self-esteem.  Problems Addressed  Unipolar Depression Goals 1. Alleviate depressive symptoms and return to previous level of effective functioning. 2. Appropriately grieve the loss in order to normalize mood and to return to previously adaptive level of functioning. Objective Learn and implement behavioral strategies to overcome depression. Target Date: 2023-03-27 Frequency: Biweekly  Progress: 50 Modality: individual  Related Interventions Engage the client in "behavioral activation," increasing his/her activity level and contact with sources of reward, while identifying processes that inhibit activation.  Use behavioral techniques such as instruction, rehearsal, role-playing, role reversal, as needed, to facilitate activity in the client's daily life; reinforce success. Assist the client in developing skills that increase the likelihood of deriving pleasure from behavioral activation (e.g., assertiveness skills, developing an exercise plan, less internal/more external focus, increased social involvement); reinforce success. Objective Identify important people in life, past and present, and describe the quality, good and poor, of those relationships. Target Date: 2023-03-27 Frequency: Biweekly  Progress: 50 Modality:  individual  Related Interventions Conduct Interpersonal Therapy beginning with the assessment of the client's "interpersonal inventory" of important  past and present relationships; develop a case formulation linking depression to grief, interpersonal role disputes, role transitions, and/or interpersonal deficits). Objective Learn and implement problem-solving and decision-making skills. Target Date: 2023-03-27 Frequency: Biweekly  Progress: 50 Modality: individual  Related Interventions Conduct Problem-Solving Therapy using techniques such as psychoeducation, modeling, and role-playing to teach client problem-solving skills (i.e., defining a problem specifically, generating possible solutions, evaluating the pros and cons of each solution, selecting and implementing a plan of action, evaluating the efficacy of the plan, accepting or revising the plan); role-play application of the problem-solving skill to a real life issue. Encourage in the client the development of a positive problem orientation in which problems and solving them are viewed as a natural part of life and not something to be feared, despaired, or avoided. 3. Develop healthy interpersonal relationships that lead to the alleviation and help prevent the relapse of depression. 4. Develop healthy thinking patterns and beliefs about self, others, and the world that lead to the alleviation and help prevent the relapse of depression. 5. Recognize, accept, and cope with feelings of depression. Diagnosis F33.1  Conditions For Discharge Achievement of treatment goals and objectives   Salomon Fick, LCSW

## 2022-06-11 ENCOUNTER — Ambulatory Visit: Payer: Managed Care, Other (non HMO) | Admitting: Cardiovascular Disease

## 2022-06-12 ENCOUNTER — Ambulatory Visit (INDEPENDENT_AMBULATORY_CARE_PROVIDER_SITE_OTHER): Payer: 59 | Admitting: Psychology

## 2022-06-12 DIAGNOSIS — F331 Major depressive disorder, recurrent, moderate: Secondary | ICD-10-CM | POA: Diagnosis not present

## 2022-06-12 NOTE — Progress Notes (Signed)
[facility] Counselor/Therapist Progress Note  Patient ID: Keith Morrow, MRN: [id number],    Date: 06/12/2022  Time Spent: 2:00pm-2:45pm   45 minutes   Treatment Type: Individual Therapy  Reported Symptoms: stress  Mental Status Exam: Appearance:  Casual     Behavior: Appropriate  Motor: Normal  Speech/Language:  Normal Rate  Affect: Appropriate  Mood: normal  Thought process: normal  Thought content:   WNL  Sensory/Perceptual disturbances:   WNL  Orientation: oriented to person, place, time/date, and situation  Attention: Good  Concentration: Good  Memory: WNL  Fund of knowledge:  Good  Insight:   Good  Judgment:  Good  Impulse Control: Good   Risk Assessment: Danger to Self:  No Self-injurious Behavior: No Danger to Others: No Duty to Warn:no Physical Aggression / Violence:No  Access to Firearms a concern: No  Gang Involvement:No   Subjective:  Pt present for face-to-face individual therapy via video Webex.  Pt consents to telehealth video session due to COVID 19 pandemic. Location of pt: home Location of therapist: home office.   Pt states he and Keith Morrow had a good time having time to themselves while Keith Morrow and Keith Morrow were in [place].  Keith Morrow and Keith Morrow are back home now.   Pt did not have any falls while the girls were gone.  Keith Morrow was a big help with transfers.   Pt and Keith Morrow took care of the pets while the girls were in [place].   Everything went well.   Pt talked about work.  He traveled to [place] for 3 days last week.  Keith Morrow went with him to help him with mobility.   Pt states the trip went well.   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   Pt talked about his physical deficits.    Addressed pt's functional concerns.   Provided supportive therapy.   Interventions: Cognitive Behavioral Therapy and Insight-Oriented  Diagnosis: F33.1  Plan of Care: Recommend ongoing therapy.   Plan to meet  every two weeks.   Pt is progressing toward goals.    Treatment Plan  (Treatment Plan Target Date: 03/27/2023) Client Abilities/Strengths  Pt is bright, engaging, and motivated for therapy.   Client Treatment Preferences  Individual therapy.  Client Statement of Needs  Improve coping skills.  Symptoms  Depressed or irritable mood. Feelings of hopelessness, worthlessness, or inappropriate guilt. Low self-esteem.  Problems Addressed  Unipolar Depression Goals 1. Alleviate depressive symptoms and return to previous level of effective functioning. 2. Appropriately grieve the loss in order to normalize mood and to return to previously adaptive level of functioning. Objective Learn and implement behavioral strategies to overcome depression. Target Date: 2023-03-27 Frequency: Biweekly  Progress: 50 Modality: individual  Related Interventions Engage the client in "behavioral activation," increasing his/her activity level and contact with sources of reward, while identifying processes that inhibit activation.  Use behavioral techniques such as instruction, rehearsal, role-playing, role reversal, as needed, to facilitate activity in the client's daily life; reinforce success. Assist the client in developing skills that increase the likelihood of deriving pleasure from behavioral activation (e.g., assertiveness skills, developing an exercise plan, less internal/more external focus, increased social involvement); reinforce success. Objective Identify important people in life, past and present, and describe the quality, good and poor, of those relationships. Target Date: 2023-03-27 Frequency: Biweekly  Progress: 50 Modality: individual  Related Interventions Conduct Interpersonal Therapy beginning with the assessment of the client's "interpersonal inventory" of important  past and present relationships; develop a case formulation linking depression to grief, interpersonal role disputes, role  transitions, and/or interpersonal deficits). Objective Learn and implement problem-solving and decision-making skills. Target Date: 2023-03-27 Frequency: Biweekly  Progress: 50 Modality: individual  Related Interventions Conduct Problem-Solving Therapy using techniques such as psychoeducation, modeling, and role-playing to teach client problem-solving skills (i.e., defining a problem specifically, generating possible solutions, evaluating the pros and cons of each solution, selecting and implementing a plan of action, evaluating the efficacy of the plan, accepting or revising the plan); role-play application of the problem-solving skill to a real life issue. Encourage in the client the development of a positive problem orientation in which problems and solving them are viewed as a natural part of life and not something to be feared, despaired, or avoided. 3. Develop healthy interpersonal relationships that lead to the alleviation and help prevent the relapse of depression. 4. Develop healthy thinking patterns and beliefs about self, others, and the world that lead to the alleviation and help prevent the relapse of depression. 5. Recognize, accept, and cope with feelings of depression. Diagnosis F33.1  Conditions For Discharge Achievement of treatment goals and objectives   Keith Fick, LCSW

## 2022-07-03 ENCOUNTER — Ambulatory Visit (INDEPENDENT_AMBULATORY_CARE_PROVIDER_SITE_OTHER): Payer: 59 | Admitting: Psychology

## 2022-07-03 DIAGNOSIS — F331 Major depressive disorder, recurrent, moderate: Secondary | ICD-10-CM

## 2022-07-03 NOTE — Progress Notes (Signed)
[facility] Counselor/Therapist Progress Note  Patient ID: Keith Morrow, MRN: [id number],    Date: 07/03/2022  Time Spent: 3:00pm-3:45pm   45 minutes   Treatment Type: Individual Therapy  Reported Symptoms: stress  Mental Status Exam: Appearance:  Casual     Behavior: Appropriate  Motor: Normal  Speech/Language:  Normal Rate  Affect: Appropriate  Mood: normal  Thought process: normal  Thought content:   WNL  Sensory/Perceptual disturbances:   WNL  Orientation: oriented to person, place, time/date, and situation  Attention: Good  Concentration: Good  Memory: WNL  Fund of knowledge:  Good  Insight:   Good  Judgment:  Good  Impulse Control: Good   Risk Assessment: Danger to Self:  No Self-injurious Behavior: No Danger to Others: No Duty to Warn:no Physical Aggression / Violence:No  Access to Firearms a concern: No  Gang Involvement:No   Subjective:  Pt present for face-to-face individual therapy via video Webex.  Pt consents to telehealth video session due to COVID 19 pandemic. Location of pt: home Location of therapist: home office.   Pt talked about work.   It has been very busy but has been going well.  Pt talked about starting to move [name] [place] grandmother[place]   The family is helping to do some work on preparing the house for move in.  The house is not made for pt to be able to get in and out of very well since he is wheel chair bound.  This upset pt.  Addressed pt's feelings.  Pt talked about the experience magnifying his physical deficits.    At first pt just wanted to leave but his family supported him and helped him problem solve how to make the space wheelchair accessible.  Helped pt process his feelings.   Pt talked about his mood changes from 4pm-7pm.   The house goes from being quiet to noisy with people and the dogs and pt's medications have worn off.  He tends to be irritable during that time.  The noise and  his pain affect his mood.  Addressed how pt can increase self care during that time.   Provided supportive therapy.   Interventions: Cognitive Behavioral Therapy and Insight-Oriented  Diagnosis: F33.1  Plan of Care: Recommend ongoing therapy.   Plan to meet every two weeks.   Pt is progressing toward goals.    Treatment Plan  (Treatment Plan Target Date: 03/27/2023) Client Abilities/Strengths  Pt is bright, engaging, and motivated for therapy.   Client Treatment Preferences  Individual therapy.  Client Statement of Needs  Improve coping skills.  Symptoms  Depressed or irritable mood. Feelings of hopelessness, worthlessness, or inappropriate guilt. Low self-esteem.  Problems Addressed  Unipolar Depression Goals 1. Alleviate depressive symptoms and return to previous level of effective functioning. 2. Appropriately grieve the loss in order to normalize mood and to return to previously adaptive level of functioning. Objective Learn and implement behavioral strategies to overcome depression. Target Date: 2023-03-27 Frequency: Biweekly  Progress: 50 Modality: individual  Related Interventions Engage the client in "behavioral activation," increasing his/her activity level and contact with sources of reward, while identifying processes that inhibit activation.  Use behavioral techniques such as instruction, rehearsal, role-playing, role reversal, as needed, to facilitate activity in the client's daily life; reinforce success. Assist the client in developing skills that increase the likelihood of deriving pleasure from behavioral activation (e.g., assertiveness skills, developing an exercise plan, less internal/more external focus, increased social  involvement); reinforce success. Objective Identify important people in life, past and present, and describe the quality, good and poor, of those relationships. Target Date: 2023-03-27 Frequency: Biweekly  Progress: 50 Modality: individual   Related Interventions Conduct Interpersonal Therapy beginning with the assessment of the client's "interpersonal inventory" of important past and present relationships; develop a case formulation linking depression to grief, interpersonal role disputes, role transitions, and/or interpersonal deficits). Objective Learn and implement problem-solving and decision-making skills. Target Date: 2023-03-27 Frequency: Biweekly  Progress: 50 Modality: individual  Related Interventions Conduct Problem-Solving Therapy using techniques such as psychoeducation, modeling, and role-playing to teach client problem-solving skills (i.e., defining a problem specifically, generating possible solutions, evaluating the pros and cons of each solution, selecting and implementing a plan of action, evaluating the efficacy of the plan, accepting or revising the plan); role-play application of the problem-solving skill to a real life issue. Encourage in the client the development of a positive problem orientation in which problems and solving them are viewed as a natural part of life and not something to be feared, despaired, or avoided. 3. Develop healthy interpersonal relationships that lead to the alleviation and help prevent the relapse of depression. 4. Develop healthy thinking patterns and beliefs about self, others, and the world that lead to the alleviation and help prevent the relapse of depression. 5. Recognize, accept, and cope with feelings of depression. Diagnosis F33.1  Conditions For Discharge Achievement of treatment goals and objectives   Salomon Fick, LCSW

## 2022-07-16 ENCOUNTER — Encounter: Payer: Self-pay | Admitting: Podiatry

## 2022-07-17 ENCOUNTER — Ambulatory Visit (INDEPENDENT_AMBULATORY_CARE_PROVIDER_SITE_OTHER): Payer: 59 | Admitting: Psychology

## 2022-07-17 DIAGNOSIS — F331 Major depressive disorder, recurrent, moderate: Secondary | ICD-10-CM

## 2022-07-17 NOTE — Progress Notes (Signed)
[facility] Counselor/Therapist Progress Note  Patient ID: KINNETH FUJIWARA, MRN: [id number],    Date: 07/17/2022  Time Spent: 3:00pm-3:45pm   45 minutes   Treatment Type: Individual Therapy  Reported Symptoms: stress  Mental Status Exam: Appearance:  Casual     Behavior: Appropriate  Motor: Normal  Speech/Language:  Normal Rate  Affect: Appropriate  Mood: normal  Thought process: normal  Thought content:   WNL  Sensory/Perceptual disturbances:   WNL  Orientation: oriented to person, place, time/date, and situation  Attention: Good  Concentration: Good  Memory: WNL  Fund of knowledge:  Good  Insight:   Good  Judgment:  Good  Impulse Control: Good   Risk Assessment: Danger to Self:  No Self-injurious Behavior: No Danger to Others: No Duty to Warn:no Physical Aggression / Violence:No  Access to Firearms a concern: No  Gang Involvement:No   Subjective:  Pt present for face-to-face individual therapy via video Webex.  Pt consents to telehealth video session due to COVID 19 pandemic. Location of pt: home Location of therapist: home office.   Pt talked about work.   He is going into the office a couple of days a week and works from home the rest of the time.  Work has been busy.  Pt is feeling behind in his work and somewhat overwhelmed.  Worked on Optician, dispensing.  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   Pt talked about visiting [name] last weekend in [place].  They had a good visit.  [name] is doing well.    Pt talked about his daughter [name].  She has moved to college and has needed help from pt to set up utilities in the rental house.  Working with her was frustrating.  Addressed pt's frustrations.   Provided supportive therapy.   Interventions: Cognitive Behavioral Therapy and Insight-Oriented  Diagnosis: F33.1  Plan of Care: Recommend ongoing therapy.   Plan to meet every two weeks.   Pt is progressing toward goals.     Treatment Plan  (Treatment Plan Target Date: 03/27/2023) Client Abilities/Strengths  Pt is bright, engaging, and motivated for therapy.   Client Treatment Preferences  Individual therapy.  Client Statement of Needs  Improve coping skills.  Symptoms  Depressed or irritable mood. Feelings of hopelessness, worthlessness, or inappropriate guilt. Low self-esteem.  Problems Addressed  Unipolar Depression Goals 1. Alleviate depressive symptoms and return to previous level of effective functioning. 2. Appropriately grieve the loss in order to normalize mood and to return to previously adaptive level of functioning. Objective Learn and implement behavioral strategies to overcome depression. Target Date: 2023-03-27 Frequency: Biweekly  Progress: 50 Modality: individual  Related Interventions Engage the client in "behavioral activation," increasing his/her activity level and contact with sources of reward, while identifying processes that inhibit activation.  Use behavioral techniques such as instruction, rehearsal, role-playing, role reversal, as needed, to facilitate activity in the client's daily life; reinforce success. Assist the client in developing skills that increase the likelihood of deriving pleasure from behavioral activation (e.g., assertiveness skills, developing an exercise plan, less internal/more external focus, increased social involvement); reinforce success. Objective Identify important people in life, past and present, and describe the quality, good and poor, of those relationships. Target Date: 2023-03-27 Frequency: Biweekly  Progress: 50 Modality: individual  Related Interventions Conduct Interpersonal Therapy beginning with the assessment of the client's "interpersonal inventory" of important past and present relationships; develop a case formulation linking depression to grief, interpersonal role disputes, role transitions,  and/or interpersonal  deficits). Objective Learn and implement problem-solving and decision-making skills. Target Date: 2023-03-27 Frequency: Biweekly  Progress: 50 Modality: individual  Related Interventions Conduct Problem-Solving Therapy using techniques such as psychoeducation, modeling, and role-playing to teach client problem-solving skills (i.e., defining a problem specifically, generating possible solutions, evaluating the pros and cons of each solution, selecting and implementing a plan of action, evaluating the efficacy of the plan, accepting or revising the plan); role-play application of the problem-solving skill to a real life issue. Encourage in the client the development of a positive problem orientation in which problems and solving them are viewed as a natural part of life and not something to be feared, despaired, or avoided. 3. Develop healthy interpersonal relationships that lead to the alleviation and help prevent the relapse of depression. 4. Develop healthy thinking patterns and beliefs about self, others, and the world that lead to the alleviation and help prevent the relapse of depression. 5. Recognize, accept, and cope with feelings of depression. Diagnosis F33.1  Conditions For Discharge Achievement of treatment goals and objectives   Salomon Fick, LCSW

## 2022-07-29 ENCOUNTER — Telehealth: Payer: Self-pay

## 2022-07-29 NOTE — Telephone Encounter (Signed)
Plan of care signed and faxed to [facility] at [phone]. Form sent for scanning.  

## 2022-07-31 ENCOUNTER — Ambulatory Visit (INDEPENDENT_AMBULATORY_CARE_PROVIDER_SITE_OTHER): Payer: 59 | Admitting: Psychology

## 2022-07-31 DIAGNOSIS — F331 Major depressive disorder, recurrent, moderate: Secondary | ICD-10-CM | POA: Diagnosis not present

## 2022-07-31 NOTE — Progress Notes (Signed)
[facility] Counselor/Therapist Progress Note  Patient ID: Keith Morrow, MRN: [id number],    Date: 07/31/2022  Time Spent: 3:00pm-3:50pm   50 minutes   Treatment Type: Individual Therapy  Reported Symptoms: stress  Mental Status Exam: Appearance:  Casual     Behavior: Appropriate  Motor: Normal  Speech/Language:  Normal Rate  Affect: Appropriate  Mood: normal  Thought process: normal  Thought content:   WNL  Sensory/Perceptual disturbances:   WNL  Orientation: oriented to person, place, time/date, and situation  Attention: Good  Concentration: Good  Memory: WNL  Fund of knowledge:  Good  Insight:   Good  Judgment:  Good  Impulse Control: Good   Risk Assessment: Danger to Self:  No Self-injurious Behavior: No Danger to Others: No Duty to Warn:no Physical Aggression / Violence:No  Access to Firearms a concern: No  Gang Involvement:No   Subjective:  Pt present for face-to-face individual therapy via video Webex.  Pt consents to telehealth video session due to COVID 19 pandemic. Location of pt: home Location of therapist: home office.   Pt talked about work.    He has been going to his office a couple of days a week.  He has been busy and doing some good problem solving.   It helps pt feel valued and needed.   Pt talked about having "angry dreams" the past few months.   In the dream there is some level of conflict that is usually related to work.   Some nights pt goes to bed angry and he thinks that impacts his dreams.   Educated pt about a dream rehearsal technique to treat bad dreams.    Addressed how pt can have conversations with [name] about his day and to follow that up with a gratitude list.   Pt talked about having his family at his house for Thanksgiving.  Pt is disappointed that they are going to stay in a hotel instead of staying at their house.   Helped pt process his feelings and relationship dynamics.   Provided supportive therapy.    Interventions: Cognitive Behavioral Therapy and Insight-Oriented  Diagnosis: F33.1  Plan of Care: Recommend ongoing therapy.   Plan to meet every two weeks.   Pt is progressing toward goals.    Treatment Plan  (Treatment Plan Target Date: 03/27/2023) Client Abilities/Strengths  Pt is bright, engaging, and motivated for therapy.   Client Treatment Preferences  Individual therapy.  Client Statement of Needs  Improve coping skills.  Symptoms  Depressed or irritable mood. Feelings of hopelessness, worthlessness, or inappropriate guilt. Low self-esteem.  Problems Addressed  Unipolar Depression Goals 1. Alleviate depressive symptoms and return to previous level of effective functioning. 2. Appropriately grieve the loss in order to normalize mood and to return to previously adaptive level of functioning. Objective Learn and implement behavioral strategies to overcome depression. Target Date: 2023-03-27 Frequency: Biweekly  Progress: 50 Modality: individual  Related Interventions Engage the client in "behavioral activation," increasing his/her activity level and contact with sources of reward, while identifying processes that inhibit activation.  Use behavioral techniques such as instruction, rehearsal, role-playing, role reversal, as needed, to facilitate activity in the client's daily life; reinforce success. Assist the client in developing skills that increase the likelihood of deriving pleasure from behavioral activation (e.g., assertiveness skills, developing an exercise plan, less internal/more external focus, increased social involvement); reinforce success. Objective Identify important people in life, past and present, and describe the quality, good and poor, of those  relationships. Target Date: 2023-03-27 Frequency: Biweekly  Progress: 50 Modality: individual  Related Interventions Conduct Interpersonal Therapy beginning with the assessment of the client's "interpersonal  inventory" of important past and present relationships; develop a case formulation linking depression to grief, interpersonal role disputes, role transitions, and/or interpersonal deficits). Objective Learn and implement problem-solving and decision-making skills. Target Date: 2023-03-27 Frequency: Biweekly  Progress: 50 Modality: individual  Related Interventions Conduct Problem-Solving Therapy using techniques such as psychoeducation, modeling, and role-playing to teach client problem-solving skills (i.e., defining a problem specifically, generating possible solutions, evaluating the pros and cons of each solution, selecting and implementing a plan of action, evaluating the efficacy of the plan, accepting or revising the plan); role-play application of the problem-solving skill to a real life issue. Encourage in the client the development of a positive problem orientation in which problems and solving them are viewed as a natural part of life and not something to be feared, despaired, or avoided. 3. Develop healthy interpersonal relationships that lead to the alleviation and help prevent the relapse of depression. 4. Develop healthy thinking patterns and beliefs about self, others, and the world that lead to the alleviation and help prevent the relapse of depression. 5. Recognize, accept, and cope with feelings of depression. Diagnosis F33.1  Conditions For Discharge Achievement of treatment goals and objectives   Salomon Fick, LCSW

## 2022-08-04 ENCOUNTER — Other Ambulatory Visit: Payer: Self-pay | Admitting: Family Medicine

## 2022-08-04 DIAGNOSIS — E039 Hypothyroidism, unspecified: Secondary | ICD-10-CM

## 2022-08-08 ENCOUNTER — Encounter: Payer: Self-pay | Admitting: Family

## 2022-08-08 ENCOUNTER — Ambulatory Visit (INDEPENDENT_AMBULATORY_CARE_PROVIDER_SITE_OTHER): Payer: Managed Care, Other (non HMO) | Admitting: Family

## 2022-08-08 VITALS — BP 130/70 | HR 71 | Temp 98.6°F | Ht 74.0 in | Wt 288.5 lb

## 2022-08-08 DIAGNOSIS — B372 Candidiasis of skin and nail: Secondary | ICD-10-CM

## 2022-08-08 DIAGNOSIS — L0390 Cellulitis, unspecified: Secondary | ICD-10-CM

## 2022-08-08 MED ORDER — DOXYCYCLINE HYCLATE 100 MG PO TABS: 100.0000 mg | ORAL_TABLET | Freq: Two times a day (BID) | ORAL | 0 refills | Status: DC

## 2022-08-08 MED ORDER — FLUCONAZOLE 100 MG PO TABS: 100.0000 mg | ORAL_TABLET | Freq: Every day | ORAL | 0 refills | Status: DC

## 2022-08-08 MED ORDER — MUPIROCIN 2 % EX OINT: 1.0000 | TOPICAL_OINTMENT | Freq: Two times a day (BID) | CUTANEOUS | 0 refills | Status: DC

## 2022-08-08 NOTE — Progress Notes (Signed)
Keith Morrow is a 51 y.o. male with the following history as recorded in [employer]:  Patient Active Problem List   Diagnosis Date Noted   History of COVID-19 05/21/2022   OSA (obstructive sleep apnea) 03/03/2022   Pressure sore 01/20/2022   SK (seborrheic keratosis) 01/20/2022   COVID-19 05/24/2021   Low back pain 04/23/2021   Infection of left ear 10/08/2020   High risk medication use 08/14/2020   Numbness 08/14/2020   Hyperglycemia 07/04/2020   Rash 04/04/2020   Presence of implanted infusion pump 02/27/2020   Spastic diplegia (HCC) 02/27/2020   Right leg pain 07/10/2019   Hypersomnolence 01/16/2019   Left shoulder pain 08/23/2018   Skin lesion of scalp 08/01/2018   Constipation 02/20/2017   Visual changes 11/13/2016   Right hand weakness 10/17/2016   Dysesthesia 06/02/2016   Erectile dysfunction 02/17/2016   Sun-damaged skin 02/17/2016   Gait disturbance 12/24/2015   Spasticity 12/24/2015   Urinary urgency 12/24/2015   Insomnia 12/24/2015   Dyspnea 10/10/2015   Leg edema, right 05/15/2015   Pedal edema 04/28/2015   Dermatitis 03/06/2014   Fatigue 10/15/2012   Hypotestosteronism 11/17/2011   Preventative health care 10/07/2011   GERD (gastroesophageal reflux disease) 10/07/2011   Obesity    Depression with anxiety    Onychomycosis    Tinea pedis    Keloid scar    CAD (coronary artery disease)    Multiple sclerosis (HCC)    Hyperlipidemia    Hypothyroidism    HTN (hypertension)     Current Outpatient Medications  Medication Sig Dispense Refill   aspirin EC 81 MG tablet Take 81 mg by mouth daily.     atorvastatin (LIPITOR) 40 MG tablet Take 1 tablet (40 mg total) by mouth daily at 6 PM. 90 tablet 3   BACLOFEN IT 1 Dose by Intrathecal route as directed.      busPIRone (BUSPAR) 10 MG tablet TAKE 1 TABLET THREE TIMES A DAY 270 tablet 3   cetirizine (ZYRTEC) 10 MG tablet Take 10 mg by mouth daily as needed for allergies.     clonazePAM (KLONOPIN) 1 MG tablet  Take 1 tablet (1 mg total) by mouth 4 (four) times daily. 360 tablet 1   Clonidine HCl POWD by Does not apply route. 720mg /ml IT Baclofen. Total dose 157.89mg /day. Last pump refill 06/02/16     cyclobenzaprine (FLEXERIL) 10 MG tablet TAKE 1 TABLET BY MOUTH THREE TIMES A DAY AS NEEDED FOR MUSCLE SPASMS 90 tablet 11   doxycycline (VIBRA-TABS) 100 MG tablet Take 1 tablet (100 mg total) by mouth 2 (two) times daily. 14 tablet 0   DULoxetine (CYMBALTA) 60 MG capsule Take 1 capsule (60 mg total) by mouth 2 (two) times daily. Brand name only 180 capsule 1   EPINEPHrine (ADRENALIN) 1 MG/ML SOLN Inject into the skin.     famotidine (PEPCID) 40 MG tablet TAKE 1 TABLET DAILY AT BEDTIME 90 tablet 3   fluconazole (DIFLUCAN) 100 MG tablet Take 1 tablet (100 mg total) by mouth daily. 7 tablet 0   gabapentin (NEURONTIN) 600 MG tablet Take 1 tablet (600 mg total) by mouth 4 (four) times daily. 360 tablet 1   ibuprofen (ADVIL,MOTRIN) 200 MG tablet Take 200 mg by mouth every 6 (six) hours as needed for moderate pain.     ketoconazole (NIZORAL) 2 % shampoo Apply topically.     levothyroxine (SYNTHROID) 137 MCG tablet TAKE 1 TABLET DAILY BEFORE BREAKFAST 90 tablet 3   methylphenidate (RITALIN) 10 MG  tablet Take 1 tablet (10 mg total) by mouth 4 (four) times daily. 120 tablet 0   mupirocin ointment (BACTROBAN) 2 % Apply 1 Application topically 2 (two) times daily. 22 g 0   natalizumab (TYSABRI) 300 MG/15ML injection Inject 300 mg into the vein every 28 (twenty-eight) days.      nitroGLYCERIN (NITROSTAT) 0.4 MG SL tablet PLACE 1 TABLET UNDER THE TOUNGE EVERY 5 MINUTES AS NEEDED FOR CHEST PAIN (X 3 DOSES) 25 tablet 0   nystatin cream (MYCOSTATIN) Apply 1 application  topically as directed. 30 g 2   olmesartan-hydrochlorothiazide (BENICAR HCT) 20-12.5 MG tablet TAKE 1 TABLET DAILY 90 tablet 3   omeprazole (PRILOSEC) 40 MG capsule TAKE 1 CAPSULE DAILY 90 capsule 3   sucralfate (CARAFATE) 1 g tablet TAKE 1 TABLET BY MOUTH 4  TIMES DAILY - WITH MEALS AND AT BEDTIME. TAKE TABLET OR LIQUID BUT NOT BOTH 120 tablet 1   sucralfate (CARAFATE) 1 GM/10ML suspension Take 10 mLs (1 g total) by mouth 4 (four) times daily -  with meals and at bedtime. Take liquid or tablets but not both 420 mL 3   tamsulosin (FLOMAX) 0.4 MG CAPS capsule TAKE 1 CAPSULE DAILY 90 capsule 3   clotrimazole-betamethasone (LOTRISONE) cream Apply 1 application topically 2 (two) times daily as needed (anti-fungal). (Patient not taking: Reported on 05/20/2022) 30 g 2   dalfampridine 10 MG TB12 TAKE 1 TABLET EVERY 12 HOURS 60 tablet 5   furosemide (LASIX) 20 MG tablet 1 ta po daily x 5 days and then as needed for SOB, weight gain>3#, pedal edema and can take a second tab daily as needed (Patient not taking: Reported on 08/08/2022) 45 tablet 3   potassium chloride SA (KLOR-CON M20) 20 MEQ tablet TAKE 1 TABLET BY MOUTH EVERY DAY AS NEEDED WHEN TAKING A SECOND LASIX TAB ANY GIVEN DAY (Patient not taking: Reported on 08/08/2022) 90 tablet 3   No current facility-administered medications for this visit.    Allergies: Patient has no known allergies.  Past Medical History:  Diagnosis Date   Acute bronchitis 05/09/2013   Allergy    Bronchitis, acute 12/10/2011   Candidal skin infection 05/05/2017   Chicken pox as a child   Constipation 02/20/2017   Coronary atherosclerosis of native coronary artery    a. NSTEMI 04/20/11: tx with Promus DES to LAD; bifurcation lesion with oDx 90% tx with POBA; b. staged PCI of right post. AV Branch with Promus DES; c. 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   Visual changes 11/13/2016    Past Surgical History:  Procedure Laterality Date   CORONARY ANGIOPLASTY WITH STENT PLACEMENT     2012   PAIN PUMP IMPLANTATION N/A 08/29/2014   Procedure: Baclofen pump placement;  Surgeon: Maeola Harman, MD;  Location: [facility];  Service:  Neurosurgery;  Laterality: N/A;  Baclofen pump placement   PAIN PUMP IMPLANTATION Right 09/20/2020   Procedure: Right Baclofen pump replacement;  Surgeon: Erline Levine, MD;  Location: [facility];  Service: Neurosurgery;  Laterality: Right;  Right Baclofen pump replacement   PROGRAMABLE BACLOFEN PUMP REVISION  08/29/14   right wrist surgery     CTR   VASCULAR SURGERY     vericose vein in right femoral area   VASECTOMY     WISDOM TOOTH EXTRACTION      Family History  Problem Relation Age of Onset   Heart attack Mother 62   Thyroid disease Mother    Heart disease Mother    Thyroid disease Sister    Thyroid disease Brother    Heart disease Maternal Grandmother    Hypertension Maternal Grandmother    Hyperlipidemia Maternal Grandmother    Heart disease Maternal Grandfather    Hypertension Maternal Grandfather    Hyperlipidemia Maternal Grandfather    Stroke Paternal Grandfather    Thyroid disease Sister    Heart disease Father        2 stents   Coronary artery disease Other        questionable in father    Social History   Tobacco Use   Smoking status: Never   Smokeless tobacco: Never  Substance Use Topics   Alcohol use: Yes    Comment: socially    Subjective:  2 concerns today-  Has been self treating for suspected fungal infection- using topical medication with limited relief; does sit in jeans all day/ sleeps in jeans at night due to limited mobility secondary to [place];  Also concerned about small wound on left buttock;  thinks wound has been present for the past 3 weeks; suspects injury occurred while mowing the grass- small stick got trapped in his shorts; has used Neosporin x 1 treatment;    Objective:  Vitals:   08/08/22 0823  BP: 130/70  Pulse: 71  Temp: 98.6 F (37 C)  TempSrc: Oral  SpO2: 96%  Weight: 288 lb 8 oz (130.9 kg)  Height: 6\' 2"  (1.88 m)    General: Well developed, well nourished, in no acute distress  Skin : Warm and dry. Erythema noted on bilateral buttocks; small dime sized wound noted at base of left buttock- no warmth, streaking;  Head: Normocephalic and atraumatic  Lungs: Respirations unlabored;  Neurologic: Alert and oriented; speech intact; face symmetrical; in wheelchair;   Assessment:  1. Yeast dermatitis   2. Wound cellulitis     Plan:  Rx for Diflucan 100 mg qd x 7 days;  Rx for Doxycycline and Bactroban; discussed trying to wearing looser fitting pants rather than staying in jeans; try to change positions as can during day to promote healing; if no improvement, to consider referral wound specialist;   No follow-ups on file.  No orders of the defined types were placed in this encounter.   Requested Prescriptions   Signed Prescriptions Disp Refills   fluconazole (DIFLUCAN) 100 MG tablet 7 tablet 0    Sig: Take 1 tablet (100 mg total) by mouth daily.   mupirocin ointment (BACTROBAN) 2 % 22 g 0    Sig: Apply 1 Application topically 2 (two) times daily.   doxycycline (VIBRA-TABS) 100 MG tablet 14 tablet 0    Sig: Take 1 tablet (100 mg total) by mouth 2 (two) times daily.

## 2022-08-15 ENCOUNTER — Ambulatory Visit (INDEPENDENT_AMBULATORY_CARE_PROVIDER_SITE_OTHER): Payer: 59 | Admitting: Psychology

## 2022-08-15 DIAGNOSIS — F331 Major depressive disorder, recurrent, moderate: Secondary | ICD-10-CM

## 2022-08-15 NOTE — Progress Notes (Signed)
[facility] Counselor/Therapist Progress Note  Patient ID: DORA CLAUSS, MRN: [id number],    Date: 08/15/2022  Time Spent: 10:00am-10:50am   50 minutes   Treatment Type: Individual Therapy  Reported Symptoms: stress  Mental Status Exam: Appearance:  Casual     Behavior: Appropriate  Motor: Normal  Speech/Language:  Normal Rate  Affect: Appropriate  Mood: normal  Thought process: normal  Thought content:   WNL  Sensory/Perceptual disturbances:   WNL  Orientation: oriented to person, place, time/date, and situation  Attention: Good  Concentration: Good  Memory: WNL  Fund of knowledge:  Good  Insight:   Good  Judgment:  Good  Impulse Control: Good   Risk Assessment: Danger to Self:  No Self-injurious Behavior: No Danger to Others: No Duty to Warn:no Physical Aggression / Violence:No  Access to Firearms a concern: No  Gang Involvement:No   Subjective:  Pt present for face-to-face individual therapy via video Webex.  Pt consents to telehealth video session due to COVID 19 pandemic. Location of pt: home Location of therapist: home office.   Pt talked about getting ready to go on a work trip tomorrow to [place].  Pt's wife will go with him but pt will drive most of the trip.  There is a lot of planning to do for the trip bc of pt's disability.   Addressed the stress of planning and managing travel issues.   Pt talked about making plans for his family to stay with them for the Thanksgiving holiday.  There are home repairs they need to do and they need to get new furniture to accommodate everyone.   Pt talked about work.   There have been some leadership changes that have affected him.  Provided supportive therapy.   Interventions: Cognitive Behavioral Therapy and Insight-Oriented  Diagnosis: F33.1  Plan of Care: Recommend ongoing therapy.   Plan to meet every two weeks.   Pt is progressing toward goals.    Treatment Plan  (Treatment Plan Target Date:  03/27/2023) Client Abilities/Strengths  Pt is bright, engaging, and motivated for therapy.   Client Treatment Preferences  Individual therapy.  Client Statement of Needs  Improve coping skills.  Symptoms  Depressed or irritable mood. Feelings of hopelessness, worthlessness, or inappropriate guilt. Low self-esteem.  Problems Addressed  Unipolar Depression Goals 1. Alleviate depressive symptoms and return to previous level of effective functioning. 2. Appropriately grieve the loss in order to normalize mood and to return to previously adaptive level of functioning. Objective Learn and implement behavioral strategies to overcome depression. Target Date: 2023-03-27 Frequency: Biweekly  Progress: 50 Modality: individual  Related Interventions Engage the client in "behavioral activation," increasing his/her activity level and contact with sources of reward, while identifying processes that inhibit activation.  Use behavioral techniques such as instruction, rehearsal, role-playing, role reversal, as needed, to facilitate activity in the client's daily life; reinforce success. Assist the client in developing skills that increase the likelihood of deriving pleasure from behavioral activation (e.g., assertiveness skills, developing an exercise plan, less internal/more external focus, increased social involvement); reinforce success. Objective Identify important people in life, past and present, and describe the quality, good and poor, of those relationships. Target Date: 2023-03-27 Frequency: Biweekly  Progress: 50 Modality: individual  Related Interventions Conduct Interpersonal Therapy beginning with the assessment of the client's "interpersonal inventory" of important past and present relationships; develop a case formulation linking depression to grief, interpersonal role disputes, role transitions, and/or interpersonal deficits). Objective Learn and implement problem-solving and  decision-making skills. Target Date: 2023-03-27 Frequency: Biweekly  Progress: 50 Modality: individual  Related Interventions Conduct Problem-Solving Therapy using techniques such as psychoeducation, modeling, and role-playing to teach client problem-solving skills (i.e., defining a problem specifically, generating possible solutions, evaluating the pros and cons of each solution, selecting and implementing a plan of action, evaluating the efficacy of the plan, accepting or revising the plan); role-play application of the problem-solving skill to a real life issue. Encourage in the client the development of a positive problem orientation in which problems and solving them are viewed as a natural part of life and not something to be feared, despaired, or avoided. 3. Develop healthy interpersonal relationships that lead to the alleviation and help prevent the relapse of depression. 4. Develop healthy thinking patterns and beliefs about self, others, and the world that lead to the alleviation and help prevent the relapse of depression. 5. Recognize, accept, and cope with feelings of depression. Diagnosis F33.1  Conditions For Discharge Achievement of treatment goals and objectives   Salomon Fick, LCSW

## 2022-08-29 ENCOUNTER — Ambulatory Visit (INDEPENDENT_AMBULATORY_CARE_PROVIDER_SITE_OTHER): Payer: 59 | Admitting: Psychology

## 2022-08-29 DIAGNOSIS — F331 Major depressive disorder, recurrent, moderate: Secondary | ICD-10-CM | POA: Diagnosis not present

## 2022-08-29 NOTE — Progress Notes (Signed)
[facility] Counselor/Therapist Progress Note  Patient ID: CORNELIO PARKERSON, MRN: [id number],    Date: 08/29/2022  Time Spent: 2:00pm - 2:50pm  50 minutes   Treatment Type: Individual Therapy  Reported Symptoms: stress  Mental Status Exam: Appearance:  Casual     Behavior: Appropriate  Motor: Normal  Speech/Language:  Normal Rate  Affect: Appropriate  Mood: normal  Thought process: normal  Thought content:   WNL  Sensory/Perceptual disturbances:   WNL  Orientation: oriented to person, place, time/date, and situation  Attention: Good  Concentration: Good  Memory: WNL  Fund of knowledge:  Good  Insight:   Good  Judgment:  Good  Impulse Control: Good   Risk Assessment: Danger to Self:  No Self-injurious Behavior: No Danger to Others: No Duty to Warn:no Physical Aggression / Violence:No  Access to Firearms a concern: No  Gang Involvement:No   Subjective:  Pt present for face-to-face individual therapy via video Webex.  Pt consents to telehealth video session due to COVID 19 pandemic. Location of pt: home Location of therapist: home office.   Pt talked about his Thanksgiving plans with family.  They have to do home repairs to accommodate guests.  Pt is not sure he can afford or have the time to do the repairs.   Addressed the stress of planning for the holidays.   Pt talked about his work trip last week.  He states he had a good trip.   Thurston Hole accompanied him on the trip to help him with mobility.  They enjoyed the time they spent together.   Pt talked about work.   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   There are some challenging employees pt has had to deal with.   Worked on Optician, dispensing.  Pt has been managing his pain better with a new medication regimen.  Pt is continuing to go to PT to work on strength and pain issues.   Provided supportive therapy.   Interventions: Cognitive Behavioral Therapy and Insight-Oriented  Diagnosis: F33.1  Plan  of Care: Recommend ongoing therapy.   Plan to meet every two weeks.   Pt is progressing toward goals.    Treatment Plan  (Treatment Plan Target Date: 03/27/2023) Client Abilities/Strengths  Pt is bright, engaging, and motivated for therapy.   Client Treatment Preferences  Individual therapy.  Client Statement of Needs  Improve coping skills.  Symptoms  Depressed or irritable mood. Feelings of hopelessness, worthlessness, or inappropriate guilt. Low self-esteem.  Problems Addressed  Unipolar Depression Goals 1. Alleviate depressive symptoms and return to previous level of effective functioning. 2. Appropriately grieve the loss in order to normalize mood and to return to previously adaptive level of functioning. Objective Learn and implement behavioral strategies to overcome depression. Target Date: 2023-03-27 Frequency: Biweekly  Progress: 50 Modality: individual  Related Interventions Engage the client in "behavioral activation," increasing his/her activity level and contact with sources of reward, while identifying processes that inhibit activation.  Use behavioral techniques such as instruction, rehearsal, role-playing, role reversal, as needed, to facilitate activity in the client's daily life; reinforce success. Assist the client in developing skills that increase the likelihood of deriving pleasure from behavioral activation (e.g., assertiveness skills, developing an exercise plan, less internal/more external focus, increased social involvement); reinforce success. Objective Identify important people in life, past and present, and describe the quality, good and poor, of those relationships. Target Date: 2023-03-27 Frequency: Biweekly  Progress: 50 Modality: individual  Related Interventions Conduct Interpersonal Therapy  beginning with the assessment of the client's "interpersonal inventory" of important past and present relationships; develop a case formulation linking depression  to grief, interpersonal role disputes, role transitions, and/or interpersonal deficits). Objective Learn and implement problem-solving and decision-making skills. Target Date: 2023-03-27 Frequency: Biweekly  Progress: 50 Modality: individual  Related Interventions Conduct Problem-Solving Therapy using techniques such as psychoeducation, modeling, and role-playing to teach client problem-solving skills (i.e., defining a problem specifically, generating possible solutions, evaluating the pros and cons of each solution, selecting and implementing a plan of action, evaluating the efficacy of the plan, accepting or revising the plan); role-play application of the problem-solving skill to a real life issue. Encourage in the client the development of a positive problem orientation in which problems and solving them are viewed as a natural part of life and not something to be feared, despaired, or avoided. 3. Develop healthy interpersonal relationships that lead to the alleviation and help prevent the relapse of depression. 4. Develop healthy thinking patterns and beliefs about self, others, and the world that lead to the alleviation and help prevent the relapse of depression. 5. Recognize, accept, and cope with feelings of depression. Diagnosis F33.1  Conditions For Discharge Achievement of treatment goals and objectives   Clint Bolder, LCSW

## 2022-09-03 ENCOUNTER — Ambulatory Visit: Payer: Managed Care, Other (non HMO) | Admitting: Podiatry

## 2022-09-04 ENCOUNTER — Telehealth: Payer: Self-pay | Admitting: Neurology

## 2022-09-04 ENCOUNTER — Encounter: Payer: Self-pay | Admitting: Neurology

## 2022-09-04 ENCOUNTER — Ambulatory Visit (INDEPENDENT_AMBULATORY_CARE_PROVIDER_SITE_OTHER): Payer: Managed Care, Other (non HMO) | Admitting: Neurology

## 2022-09-04 VITALS — BP 120/73 | HR 89 | Ht 75.0 in | Wt 305.0 lb

## 2022-09-04 DIAGNOSIS — G35 Multiple sclerosis: Secondary | ICD-10-CM

## 2022-09-04 DIAGNOSIS — R3915 Urgency of urination: Secondary | ICD-10-CM

## 2022-09-04 DIAGNOSIS — G801 Spastic diplegic cerebral palsy: Secondary | ICD-10-CM

## 2022-09-04 DIAGNOSIS — Z79899 Other long term (current) drug therapy: Secondary | ICD-10-CM

## 2022-09-04 DIAGNOSIS — Z95828 Presence of other vascular implants and grafts: Secondary | ICD-10-CM

## 2022-09-04 MED ORDER — CLONAZEPAM 1 MG PO TABS: 1.0000 mg | ORAL_TABLET | Freq: Four times a day (QID) | ORAL | 1 refills | Status: DC

## 2022-09-04 MED ORDER — DALFAMPRIDINE ER 10 MG PO TB12: 10.0000 mg | ORAL_TABLET | Freq: Two times a day (BID) | ORAL | 5 refills | Status: DC

## 2022-09-04 MED ORDER — METHYLPHENIDATE HCL 10 MG PO TABS
10.0000 mg | ORAL_TABLET | Freq: Four times a day (QID) | ORAL | 0 refills | Status: DC
Start: 2022-09-04 — End: 2022-12-03

## 2022-09-04 NOTE — Progress Notes (Signed)
HPI     Follow-up    Additional comments: Pt in room #2 and alone. Pt here today for f/u.      Last edited by Beckie Salts, [place] on 09/04/2022  3:03 PM.       Chief Complaint   Follow-up     HISTORY:  Keith Morrow is a 51 y.o. man with a relapsing form of multiple sclerosis who has right greater than left leg weakness and progressive difficulties with gait function.  Due to severe spasticity, a baclofen pump was placed in 2013 and replaced 09/2020.  Update 09/04/2022: He is on Tysabri and tolerates it well.   No exacerbations.  He has a baclofen pump.  He is now using [facility] to fill the pump.    He cannot use a walker now.   He notes more trouble transferring out of the wheelchair or using a toilet.He spends most of the day in the chair.    He tries to exercise some with squats in the morning holding on for support.      He has noted slow progression of weakness and reduced coordination in the right arm/hand.    The hand issues have progressed more in the past couple years.    Sensation is generally the same.     Vision is fine.   Bladder function is fine on tamsulosin.  No UTI.      He sleeps well at night.  He sleeps better with CPAP.  We discussed different types of masks.  Mood is doing reasonably well.    He has a new baclofen pump (2021) .   He has patient controlled boluses that kick in in about 20 minutes.    Because he is feeling weaker, he is trying to use few boluses.  Night time spasticity is doing ok.  He is getting the pump refill with a Home Health service.    He also takes clonazepam at night as well   MS history:  In 1997, he had left optic neuritis and then had numbness in both feet. MRI was consistent with MS and a lumbar puncture was also performed and CSF was consistent with MS. Initially, he was placed on Copaxone. He had difficulties with compliance and 5 years later switched to Avonex. Unfortunate, he had difficulties tolerating Avonex and also had  difficulty tolerating Rebif. In 2004, he started Tysabri but was only able to take 2 doses before was taken off the market. He restarted in 2005 and has been on Tysabri since then with the exception of a 6 month holiday. Tolerates Tysabri well. He has not had any definite exacerbations though his gait has worsened over the past few years. He is JCV antibody negative.   He had recent MRI of the brain and spine and there were no changes.      Spasticity history: He was experiencing progressive spasticity in both legs.  He got a baclofen pump in 2013.  Prior to the pump he was having severe tonic spasticity as well as frequent phasic spasms.  These improved.  Due to the spasticity, baclofen was increased in 2016 and 2017 but he became weaker.  I began to see him again and we added clonidine to the baclofen and cut his dose and added nighttime boluses.  He did better with those settings.  Imaging: MRI of the cervical spine dated 07/25/2020 and  MRI of the cervical spine from 07/05/2015.  Both showed multiple T2 hyperintense  foci within the spinal cord.  They are located posterolaterally to the right below the cervicomedullary junction, anteriorly adjacent to C3C4,  posterolaterally to the left adjacent to C4, posterior adjacent to C4-C5, both the left and the right adjacent to C4-C5, very large right focus adjacent to C5, posterior laterally to the left adjacent to C6, towards the right adjacent to C6 and posterior laterally to the right adjacent to C7-T1.  None of the foci enhance.  There did not appear to be any new lesions between 2016 and 2021.   There is a right disc protrusion causing mild to moderate right foraminal narrowing.  At C5-C6, there is a right disc protrusion but no nerve root compression  The MRI of the brain from 07/25/2020 is unchanged compared to the 12/19/2015 MRI according to the official report.  Most foci are supratentorial but somewhat noted in the pons.    There are no enhancing lesions.    PHYSICAL EXAM  General: The patient is well-developed and well-nourished and in no acute distress.  Extremities:   There are no rashes or edema.    Neurologic Exam  Mental status: The patient is alert and oriented x 3 at the time of the examination.  Good focus/attention.    Speech is normal.  CN:   Extraocular muscles are normal.  No ptosis.    Symmetric facial strength and sensation.    PE/TP midline  Motor:  Muscle bulk is normal.  tone is increased in the right arm and minimally increased in the legs.  Strength is 4+/5 in the right arm, 5/5 in the left arm, 2-2+/5 in the right leg and 2+-3/5 in the left leg .   Reduced RAM in right hand  Sensation: He had symmetric sensation to touch and vibration today.  Gait and station: He is wheelchair-bound  Reflexes: Deep tendon reflexes are trace at the left knee and trace at the right knee.   No ankle clonus   ASSESSMENT  High risk medication use - Plan: Stratify JCV Antibody Test ([facility]), CBC with Differential/Platelet  Multiple sclerosis (HCC) - Plan: Stratify JCV Antibody Test ([facility]), CBC with Differential/Platelet, MR BRAIN W WO CONTRAST, MR CERVICAL SPINE W WO CONTRAST  Spastic diplegia (HCC)  Presence of implanted infusion pump  Urinary urgency   PLAN: 1.  He will continue with the intrathecal pump : 1800 mcg/mL baclofen and 420 mcg/mL clonidine.   2.   Continue Tysabri every 4 weeks.   Check JCV antibody and CBC today. 3.   Continue other med's   .  Refills will be sent in. 4.   Return in 6 months or sooner if there are new or worsening neurologic issues.  We can make further adjustments if needed before the next refill  40-minute office visit with the majority of the time spent face-to-face for history and physical, discussion/counseling and decision-making.  Additional time with record review and documentation.   Keith Morrow A. Epimenio Foot, MD, PhD, FAAN Certified in Neurology, Clinical Neurophysiology, Sleep Medicine, Pain  Medicine and Neuroimaging Director, [facility]  [facility] [address], [address] [place], [place] [address] [phone]

## 2022-09-04 NOTE — Telephone Encounter (Signed)
Placed JCV lab in [facility] lock box for routine lab pick up. Results pending. 

## 2022-09-05 LAB — CBC WITH DIFFERENTIAL/PLATELET
Basophils Absolute: 0.1 10*3/uL (ref 0.0–0.2)
Basos: 2 %
EOS (ABSOLUTE): 0.6 10*3/uL — ABNORMAL HIGH (ref 0.0–0.4)
Eos: 8 %
Hematocrit: 39.2 % (ref 37.5–51.0)
Hemoglobin: 13.3 g/dL (ref 13.0–17.7)
Immature Grans (Abs): 0.1 10*3/uL (ref 0.0–0.1)
Immature Granulocytes: 1 %
Lymphocytes Absolute: 2.3 10*3/uL (ref 0.7–3.1)
Lymphs: 29 %
MCH: 29.4 pg (ref 26.6–33.0)
MCHC: 33.9 g/dL (ref 31.5–35.7)
MCV: 87 fL (ref 79–97)
Monocytes Absolute: 0.7 10*3/uL (ref 0.1–0.9)
Monocytes: 9 %
Neutrophils Absolute: 4.2 10*3/uL (ref 1.4–7.0)
Neutrophils: 51 %
Platelets: 217 10*3/uL (ref 150–450)
RBC: 4.53 x10E6/uL (ref 4.14–5.80)
RDW: 13.6 % (ref 11.6–15.4)
WBC: 8 10*3/uL (ref 3.4–10.8)

## 2022-09-12 ENCOUNTER — Ambulatory Visit (INDEPENDENT_AMBULATORY_CARE_PROVIDER_SITE_OTHER): Payer: 59 | Admitting: Psychology

## 2022-09-12 DIAGNOSIS — F331 Major depressive disorder, recurrent, moderate: Secondary | ICD-10-CM

## 2022-09-12 NOTE — Progress Notes (Signed)
[facility] Counselor/Therapist Progress Note  Patient ID: Keith Morrow, MRN: [id number],    Date: 09/12/2022  Time Spent: 2:00pm - 2:45pm  45  minutes   Treatment Type: Individual Therapy  Reported Symptoms: stress  Mental Status Exam: Appearance:  Casual     Behavior: Appropriate  Motor: Normal  Speech/Language:  Normal Rate  Affect: Appropriate  Mood: normal  Thought process: normal  Thought content:   WNL  Sensory/Perceptual disturbances:   WNL  Orientation: oriented to person, place, time/date, and situation  Attention: Good  Concentration: Good  Memory: WNL  Fund of knowledge:  Good  Insight:   Good  Judgment:  Good  Impulse Control: Good   Risk Assessment: Danger to Self:  No Self-injurious Behavior: No Danger to Others: No Duty to Warn:no Physical Aggression / Violence:No  Access to Firearms a concern: No  Gang Involvement:No   Subjective:  Pt present for face-to-face individual therapy via video Webex.  Pt consents to telehealth video session due to COVID 19 pandemic. Location of pt: home Location of therapist: home office.   Pt talked about work.  It has been a very busy work week.   Addressed the work stress.   Pt has had to do a lot of problem solving but he is good at that.  Pt feels overwhelmed at times.  Worked on Child psychotherapist.  Pt talked about preparing the house for family to come for Thanksgiving.    They have to do bathroom repairs.  Pt states the home repairs are stressful.  They are doing the work themselves so that adds to the stress.    Provided supportive therapy.   Interventions: Cognitive Behavioral Therapy and Insight-Oriented  Diagnosis: F33.1  Plan of Care: Recommend ongoing therapy.   Plan to meet every two weeks.   Pt is progressing toward goals.    Treatment Plan  (Treatment Plan Target Date: 03/27/2023) Client Abilities/Strengths  Pt is bright, engaging, and motivated for therapy.   Client Treatment  Preferences  Individual therapy.  Client Statement of Needs  Improve coping skills.  Symptoms  Depressed or irritable mood. Feelings of hopelessness, worthlessness, or inappropriate guilt. Low self-esteem.  Problems Addressed  Unipolar Depression Goals 1. Alleviate depressive symptoms and return to previous level of effective functioning. 2. Appropriately grieve the loss in order to normalize mood and to return to previously adaptive level of functioning. Objective Learn and implement behavioral strategies to overcome depression. Target Date: 2023-03-27 Frequency: Biweekly  Progress: 50 Modality: individual  Related Interventions Engage the client in "behavioral activation," increasing his/her activity level and contact with sources of reward, while identifying processes that inhibit activation.  Use behavioral techniques such as instruction, rehearsal, role-playing, role reversal, as needed, to facilitate activity in the client's daily life; reinforce success. Assist the client in developing skills that increase the likelihood of deriving pleasure from behavioral activation (e.g., assertiveness skills, developing an exercise plan, less internal/more external focus, increased social involvement); reinforce success. Objective Identify important people in life, past and present, and describe the quality, good and poor, of those relationships. Target Date: 2023-03-27 Frequency: Biweekly  Progress: 50 Modality: individual  Related Interventions Conduct Interpersonal Therapy beginning with the assessment of the client's "interpersonal inventory" of important past and present relationships; develop a case formulation linking depression to grief, interpersonal role disputes, role transitions, and/or interpersonal deficits). Objective Learn and implement problem-solving and decision-making skills. Target Date: 2023-03-27 Frequency: Biweekly  Progress: 50 Modality: individual  Related  Interventions Conduct  Problem-Solving Therapy using techniques such as psychoeducation, modeling, and role-playing to teach client problem-solving skills (i.e., defining a problem specifically, generating possible solutions, evaluating the pros and cons of each solution, selecting and implementing a plan of action, evaluating the efficacy of the plan, accepting or revising the plan); role-play application of the problem-solving skill to a real life issue. Encourage in the client the development of a positive problem orientation in which problems and solving them are viewed as a natural part of life and not something to be feared, despaired, or avoided. 3. Develop healthy interpersonal relationships that lead to the alleviation and help prevent the relapse of depression. 4. Develop healthy thinking patterns and beliefs about self, others, and the world that lead to the alleviation and help prevent the relapse of depression. 5. Recognize, accept, and cope with feelings of depression. Diagnosis F33.1  Conditions For Discharge Achievement of treatment goals and objectives   Clint Bolder, LCSW

## 2022-09-15 NOTE — Telephone Encounter (Signed)
JCV ab drawn on 09/04/22 indeterminate, index: 0.21. Inhibition assay: negative.

## 2022-09-16 ENCOUNTER — Telehealth: Payer: Self-pay | Admitting: Neurology

## 2022-09-16 NOTE — Telephone Encounter (Signed)
Novella Rob: [id number] exp. 09/16/22-03/15/23 sent to [facility]

## 2022-09-23 ENCOUNTER — Ambulatory Visit: Payer: Managed Care, Other (non HMO) | Admitting: Family Medicine

## 2022-09-23 NOTE — Progress Notes (Shared)
Subjective:   By signing my name below, I, [name], attest that this documentation has been prepared under the direction and in the presence of [name], MD., 09/23/2022.     Patient ID: Keith Morrow, male    DOB: [birth year], 51 y.o.   MRN: [id number]  No chief complaint on file.  HPI Patient is in today for an office visit.  Past Medical History:  Diagnosis Date   Acute bronchitis 05/09/2013   Allergy    Bronchitis, acute 12/10/2011   Candidal skin infection 05/05/2017   Chicken pox as a child   Constipation 02/20/2017   Coronary atherosclerosis of native coronary artery    a. NSTEMI 04/20/11: tx with Promus DES to LAD; bifurcation lesion with oDx 90% tx with POBA; b. staged PCI of right post. AV Branch with Promus DES; c. 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   Visual changes 11/13/2016   Past Surgical History:  Procedure Laterality Date   CORONARY ANGIOPLASTY WITH STENT PLACEMENT     2012   PAIN PUMP IMPLANTATION N/A 08/29/2014   Procedure: Baclofen pump placement;  Surgeon: Erline Levine, MD;  Location: [facility];  Service: Neurosurgery;  Laterality: N/A;  Baclofen pump placement   PAIN PUMP IMPLANTATION Right 09/20/2020   Procedure: Right Baclofen pump replacement;  Surgeon: Erline Levine, MD;  Location: [facility];  Service: Neurosurgery;  Laterality: Right;  Right Baclofen pump replacement    PROGRAMABLE BACLOFEN PUMP REVISION  08/29/14   right wrist surgery     CTR   VASCULAR SURGERY     vericose vein in right femoral area   VASECTOMY     WISDOM TOOTH EXTRACTION     Family History  Problem Relation Age of Onset   Heart attack Mother 72   Thyroid disease Mother    Heart disease Mother    Thyroid disease Sister    Thyroid disease Brother    Heart disease Maternal Grandmother    Hypertension Maternal Grandmother    Hyperlipidemia Maternal Grandmother    Heart disease Maternal Grandfather    Hypertension Maternal Grandfather    Hyperlipidemia Maternal Grandfather    Stroke Paternal Grandfather    Thyroid disease Sister    Heart disease Father        2 stents   Coronary artery disease Other        questionable in father   Social History   Socioeconomic History   Marital status: Married    Spouse name: Not on file   Number of children: 3   Years of education: Not on file   Highest education level: Not on file  Occupational History   Not on file  Tobacco Use   Smoking status: Never   Smokeless tobacco: Never  Vaping Use   Vaping Use: Never  used  Substance and Sexual Activity   Alcohol use: Yes    Comment: socially   Drug use: No   Sexual activity: Yes    Partners: Female    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     atorvastatin (LIPITOR) 40 MG tablet Take 1 tablet (40 mg total) by mouth daily at 6 PM. 90 tablet 3   BACLOFEN IT 1 Dose by  Intrathecal route as directed.      busPIRone (BUSPAR) 10 MG tablet TAKE 1 TABLET THREE TIMES A DAY 270 tablet 3   cetirizine (ZYRTEC) 10 MG tablet Take 10 mg by mouth daily as needed for allergies.     clonazePAM (KLONOPIN) 1 MG tablet Take 1 tablet (1 mg total) by mouth 4 (four) times daily. 360 tablet 1   Clonidine HCl POWD by Does not apply route. 742m/ml IT Baclofen. Total dose 157.845mday. Last pump refill 06/02/16     clotrimazole-betamethasone (LOTRISONE) cream Apply 1 application topically 2 (two) times daily as needed (anti-fungal). 30 g 2   cyclobenzaprine (FLEXERIL) 10 MG tablet TAKE 1 TABLET BY MOUTH THREE TIMES A DAY AS NEEDED FOR MUSCLE SPASMS 90 tablet 11   dalfampridine 10 MG TB12 Take 1 tablet (10 mg total) by mouth every 12 (twelve) hours. 60 tablet 5   DULoxetine (CYMBALTA) 60 MG capsule Take 1 capsule (60 mg total) by mouth 2 (two) times daily. Brand name only 180 capsule 1   EPINEPHrine (ADRENALIN) 1 MG/ML SOLN Inject into the skin.     famotidine (PEPCID) 40 MG tablet TAKE 1 TABLET DAILY AT BEDTIME 90 tablet 3   furosemide (LASIX) 20 MG tablet 1 ta po daily x 5 days and then as needed for SOB, weight gain>3#, pedal edema and can take a second tab daily as needed 45 tablet 3   gabapentin (NEURONTIN) 600 MG tablet Take 1 tablet (600 mg total) by mouth 4 (four) times daily. 360 tablet 1   ibuprofen (ADVIL,MOTRIN) 200 MG tablet Take 200 mg by mouth every 6 (six) hours as needed for moderate pain.     ketoconazole (NIZORAL) 2 % shampoo Apply topically.     levothyroxine (SYNTHROID) 137 MCG tablet TAKE 1 TABLET DAILY BEFORE BREAKFAST 90 tablet 3   methylphenidate (RITALIN) 10 MG tablet Take 1 tablet (10 mg total) by mouth 4 (four) times daily. 120 tablet 0   mupirocin ointment (BACTROBAN) 2 % Apply 1 Application topically 2 (two) times daily. 22 g 0   natalizumab (TYSABRI) 300 MG/15ML injection Inject 300 mg into the vein every 28 (twenty-eight) days.      nitroGLYCERIN  (NITROSTAT) 0.4 MG SL tablet PLACE 1 TABLET UNDER THE TOUNGE EVERY 5 MINUTES AS NEEDED FOR CHEST PAIN (X 3 DOSES) 25 tablet 0   nystatin cream (MYCOSTATIN) Apply 1 application  topically as directed. 30 g 2   olmesartan-hydrochlorothiazide (BENICAR HCT) 20-12.5 MG tablet TAKE 1 TABLET DAILY  90 tablet 3   omeprazole (PRILOSEC) 40 MG capsule TAKE 1 CAPSULE DAILY 90 capsule 3   potassium chloride SA (KLOR-CON M20) 20 MEQ tablet TAKE 1 TABLET BY MOUTH EVERY DAY AS NEEDED WHEN TAKING A SECOND LASIX TAB ANY GIVEN DAY 90 tablet 3   sucralfate (CARAFATE) 1 g tablet TAKE 1 TABLET BY MOUTH 4 TIMES DAILY - WITH MEALS AND AT BEDTIME. TAKE TABLET OR LIQUID BUT NOT BOTH 120 tablet 1   sucralfate (CARAFATE) 1 GM/10ML suspension Take 10 mLs (1 g total) by mouth 4 (four) times daily -  with meals and at bedtime. Take liquid or tablets but not both 420 mL 3   tamsulosin (FLOMAX) 0.4 MG CAPS capsule TAKE 1 CAPSULE DAILY 90 capsule 3   No facility-administered medications prior to visit.   No Known Allergies ROS    Objective:    Physical Exam Constitutional:      General: He is not in acute distress.    Appearance: Normal appearance. He is not ill-appearing.  HENT:     Head: Normocephalic and atraumatic.     Right Ear: External ear normal.     Left Ear: External ear normal.     Mouth/Throat:     Mouth: Mucous membranes are moist.     Pharynx: Oropharynx is clear.  Eyes:     Extraocular Movements: Extraocular movements intact.     Pupils: Pupils are equal, round, and reactive to light.  Cardiovascular:     Rate and Rhythm: Normal rate and regular rhythm.     Pulses: Normal pulses.     Heart sounds: Normal heart sounds. No murmur heard.    No gallop.  Pulmonary:     Effort: Pulmonary effort is normal. No respiratory distress.     Breath sounds: Normal breath sounds. No wheezing or rales.  Abdominal:     General: Bowel sounds are normal.  Skin:    General: Skin is warm and dry.  Neurological:      Mental Status: He is alert and oriented to person, place, and time.  Psychiatric:        Mood and Affect: Mood normal.        Behavior: Behavior normal.        Judgment: Judgment normal.    There were no vitals taken for this visit. Wt Readings from Last 3 Encounters:  09/04/22 (!) 305 lb (138.3 kg)  08/08/22 288 lb 8 oz (130.9 kg)  05/20/22 (!) 309 lb (140.2 kg)   Diabetic Foot Exam - Simple   No data filed    Lab Results  Component Value Date   WBC 8.0 09/04/2022   HGB 13.3 09/04/2022   HCT 39.2 09/04/2022   PLT 217 09/04/2022   GLUCOSE 81 05/20/2022   CHOL 133 05/20/2022   TRIG 76.0 05/20/2022   HDL 44.90 05/20/2022   LDLCALC 73 05/20/2022   ALT 26 05/20/2022   AST 18 05/20/2022   NA 140 05/20/2022   K 3.8 05/20/2022   CL 100 05/20/2022   CREATININE 0.90 05/20/2022   BUN 15 05/20/2022   CO2 34 (H) 05/20/2022   TSH 0.99 05/20/2022   PSA 0.52 02/11/2016   INR 1.00 04/21/2011   HGBA1C 5.6 05/20/2022   Lab Results  Component Value Date   TSH 0.99 05/20/2022   Lab Results  Component Value Date   WBC 8.0 09/04/2022   HGB 13.3 09/04/2022   HCT 39.2 09/04/2022   MCV 87 09/04/2022   PLT  217 09/04/2022   Lab Results  Component Value Date   NA 140 05/20/2022   K 3.8 05/20/2022   CO2 34 (H) 05/20/2022   GLUCOSE 81 05/20/2022   BUN 15 05/20/2022   CREATININE 0.90 05/20/2022   BILITOT 0.6 05/20/2022   ALKPHOS 82 05/20/2022   AST 18 05/20/2022   ALT 26 05/20/2022   PROT 6.7 05/20/2022   ALBUMIN 4.2 05/20/2022   CALCIUM 9.3 05/20/2022   ANIONGAP 10 09/18/2020   EGFR 105 04/02/2021   GFR 100.48 05/20/2022   Lab Results  Component Value Date   CHOL 133 05/20/2022   Lab Results  Component Value Date   HDL 44.90 05/20/2022   Lab Results  Component Value Date   LDLCALC 73 05/20/2022   Lab Results  Component Value Date   TRIG 76.0 05/20/2022   Lab Results  Component Value Date   CHOLHDL 3 05/20/2022   Lab Results  Component Value Date    HGBA1C 5.6 05/20/2022      Assessment & Plan:   Problem List Items Addressed This Visit   None  No orders of the defined types were placed in this encounter.  I, [name], personally preformed the services described in this documentation.  All medical record entries made by the scribe were at my direction and in my presence.  I have reviewed the chart and discharge instructions (if applicable) and agree that the record reflects my personal performance and is accurate and complete. 09/23/2022  I,Mohammed Iqbal,acting as a scribe for Penni Homans, MD.,have documented all relevant documentation on the behalf of Penni Homans, MD,as directed by  Penni Homans, MD while in the presence of Penni Homans, MD.  [name]

## 2022-09-26 ENCOUNTER — Ambulatory Visit (INDEPENDENT_AMBULATORY_CARE_PROVIDER_SITE_OTHER): Payer: 59 | Admitting: Psychology

## 2022-09-26 ENCOUNTER — Ambulatory Visit (INDEPENDENT_AMBULATORY_CARE_PROVIDER_SITE_OTHER): Payer: Managed Care, Other (non HMO) | Admitting: Family

## 2022-09-26 VITALS — BP 117/60 | HR 85 | Temp 98.0°F | Resp 18 | Ht 75.0 in | Wt 288.0 lb

## 2022-09-26 DIAGNOSIS — K219 Gastro-esophageal reflux disease without esophagitis: Secondary | ICD-10-CM

## 2022-09-26 DIAGNOSIS — E039 Hypothyroidism, unspecified: Secondary | ICD-10-CM

## 2022-09-26 DIAGNOSIS — R739 Hyperglycemia, unspecified: Secondary | ICD-10-CM | POA: Diagnosis not present

## 2022-09-26 DIAGNOSIS — E038 Other specified hypothyroidism: Secondary | ICD-10-CM

## 2022-09-26 DIAGNOSIS — F331 Major depressive disorder, recurrent, moderate: Secondary | ICD-10-CM | POA: Diagnosis not present

## 2022-09-26 DIAGNOSIS — G35 Multiple sclerosis: Secondary | ICD-10-CM | POA: Diagnosis not present

## 2022-09-26 DIAGNOSIS — F418 Other specified anxiety disorders: Secondary | ICD-10-CM

## 2022-09-26 DIAGNOSIS — I2510 Atherosclerotic heart disease of native coronary artery without angina pectoris: Secondary | ICD-10-CM

## 2022-09-26 DIAGNOSIS — Z1211 Encounter for screening for malignant neoplasm of colon: Secondary | ICD-10-CM

## 2022-09-26 DIAGNOSIS — G4733 Obstructive sleep apnea (adult) (pediatric): Secondary | ICD-10-CM

## 2022-09-26 DIAGNOSIS — I10 Essential (primary) hypertension: Secondary | ICD-10-CM | POA: Diagnosis not present

## 2022-09-26 DIAGNOSIS — Z23 Encounter for immunization: Secondary | ICD-10-CM | POA: Diagnosis not present

## 2022-09-26 DIAGNOSIS — E782 Mixed hyperlipidemia: Secondary | ICD-10-CM | POA: Diagnosis not present

## 2022-09-26 LAB — BASIC METABOLIC PANEL
BUN: 21 mg/dL (ref 6–23)
CO2: 30 mEq/L (ref 19–32)
Calcium: 9 mg/dL (ref 8.4–10.5)
Chloride: 103 mEq/L (ref 96–112)
Creatinine, Ser: 1.01 mg/dL (ref 0.40–1.50)
GFR: 87.28 mL/min (ref >60.00)
Glucose, Bld: 102 mg/dL — ABNORMAL HIGH (ref 70–99)
Potassium: 4.3 mEq/L (ref 3.5–5.1)
Sodium: 141 mEq/L (ref 135–145)

## 2022-09-26 LAB — HEMOGLOBIN A1C: Hgb A1c MFr Bld: 5.9 % (ref 4.6–6.5)

## 2022-09-26 LAB — TSH: TSH: 0.51 u[IU]/mL (ref 0.35–5.50)

## 2022-09-26 MED ORDER — KETOCONAZOLE 2 % EX SHAM: MEDICATED_SHAMPOO | CUTANEOUS | 5 refills | Status: DC

## 2022-09-26 NOTE — Assessment & Plan Note (Signed)
Denies chest pain.  Continue to follow with dermatology.

## 2022-09-26 NOTE — Assessment & Plan Note (Signed)
Lab Results  Component Value Date   CHOL 133 05/20/2022   HDL 44.90 05/20/2022   LDLCALC 73 05/20/2022   TRIG 76.0 05/20/2022   CHOLHDL 3 05/20/2022   At goal on lipitor 40mg . Continue current dose.

## 2022-09-26 NOTE — Assessment & Plan Note (Signed)
BP Readings from Last 3 Encounters:  09/26/22 117/60  09/04/22 120/73  08/08/22 130/70   At goal, continue benicar HCT.

## 2022-09-26 NOTE — Assessment & Plan Note (Signed)
Reports good compliance with cpap. 
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
## 2022-09-26 NOTE — Assessment & Plan Note (Signed)
Follows with Dr. Kerman Passey.  States that JCV antibody test was not drawn at his most recent visit. Will add to today's labs and plan to forward results to neurology.

## 2022-09-26 NOTE — Assessment & Plan Note (Signed)
Lab Results  Component Value Date   TSH 0.99 05/20/2022

## 2022-09-26 NOTE — Assessment & Plan Note (Signed)
Stable on buspar TID, cymbalta. Uses klonopin 3-4 times a day which is helpful.   

## 2022-09-26 NOTE — Assessment & Plan Note (Signed)
Stable on omeprazole/pepcid.

## 2022-09-26 NOTE — Progress Notes (Signed)
[facility] Counselor/Therapist Progress Note  Patient ID: Keith Morrow, MRN: [id number],    Date: 09/26/2022  Time Spent: 3:00pm - 3:45pm  45  minutes   Treatment Type: Individual Therapy  Reported Symptoms: stress  Mental Status Exam: Appearance:  Casual     Behavior: Appropriate  Motor: Normal  Speech/Language:  Normal Rate  Affect: Appropriate  Mood: normal  Thought process: normal  Thought content:   WNL  Sensory/Perceptual disturbances:   WNL  Orientation: oriented to person, place, time/date, and situation  Attention: Good  Concentration: Good  Memory: WNL  Fund of knowledge:  Good  Insight:   Good  Judgment:  Good  Impulse Control: Good   Risk Assessment: Danger to Self:  No Self-injurious Behavior: No Danger to Others: No Duty to Warn:no Physical Aggression / Violence:No  Access to Firearms a concern: No  Gang Involvement:No   Subjective:  Pt present for face-to-face individual therapy via video Webex.  Pt consents to telehealth video session due to COVID 19 pandemic. Location of pt: home Location of therapist: home office.   Pt talked about work.  He has been stressed and overloaded so his mornings have been more difficult.  Worked on Child psychotherapist.  Pt talked about seeing his PCP and having his medication adjusted.   He has been having increased swelling in his feet.   Pt talked about feeling agitated about a couple of things.  His dogs are not listening and behaving.  This frustrates pt.    Pt also got in a disagreement with [name]'s mother.   Addressed the interaction and how it impacted pt..  Provided supportive therapy.   Interventions: Cognitive Behavioral Therapy and Insight-Oriented  Diagnosis: F33.1  Plan of Care: Recommend ongoing therapy.   Plan to meet every two weeks.   Pt is progressing toward goals.    Treatment Plan  (Treatment Plan Target Date: 03/27/2023) Client Abilities/Strengths  Pt is bright, engaging, and  motivated for therapy.   Client Treatment Preferences  Individual therapy.  Client Statement of Needs  Improve coping skills.  Symptoms  Depressed or irritable mood. Feelings of hopelessness, worthlessness, or inappropriate guilt. Low self-esteem.  Problems Addressed  Unipolar Depression Goals 1. Alleviate depressive symptoms and return to previous level of effective functioning. 2. Appropriately grieve the loss in order to normalize mood and to return to previously adaptive level of functioning. Objective Learn and implement behavioral strategies to overcome depression. Target Date: 2023-03-27 Frequency: Biweekly  Progress: 50 Modality: individual  Related Interventions Engage the client in "behavioral activation," increasing his/her activity level and contact with sources of reward, while identifying processes that inhibit activation.  Use behavioral techniques such as instruction, rehearsal, role-playing, role reversal, as needed, to facilitate activity in the client's daily life; reinforce success. Assist the client in developing skills that increase the likelihood of deriving pleasure from behavioral activation (e.g., assertiveness skills, developing an exercise plan, less internal/more external focus, increased social involvement); reinforce success. Objective Identify important people in life, past and present, and describe the quality, good and poor, of those relationships. Target Date: 2023-03-27 Frequency: Biweekly  Progress: 50 Modality: individual  Related Interventions Conduct Interpersonal Therapy beginning with the assessment of the client's "interpersonal inventory" of important past and present relationships; develop a case formulation linking depression to grief, interpersonal role disputes, role transitions, and/or interpersonal deficits). Objective Learn and implement problem-solving and decision-making skills. Target Date: 2023-03-27 Frequency: Biweekly  Progress:  50 Modality: individual  Related Interventions Conduct Problem-Solving  Therapy using techniques such as psychoeducation, modeling, and role-playing to teach client problem-solving skills (i.e., defining a problem specifically, generating possible solutions, evaluating the pros and cons of each solution, selecting and implementing a plan of action, evaluating the efficacy of the plan, accepting or revising the plan); role-play application of the problem-solving skill to a real life issue. Encourage in the client the development of a positive problem orientation in which problems and solving them are viewed as a natural part of life and not something to be feared, despaired, or avoided. 3. Develop healthy interpersonal relationships that lead to the alleviation and help prevent the relapse of depression. 4. Develop healthy thinking patterns and beliefs about self, others, and the world that lead to the alleviation and help prevent the relapse of depression. 5. Recognize, accept, and cope with feelings of depression. Diagnosis F33.1  Conditions For Discharge Achievement of treatment goals and objectives   Clint Bolder, LCSW

## 2022-09-29 ENCOUNTER — Other Ambulatory Visit: Payer: Self-pay | Admitting: Family Medicine

## 2022-09-29 NOTE — Telephone Encounter (Signed)
Okay to refill it was flagged.

## 2022-10-02 ENCOUNTER — Ambulatory Visit (HOSPITAL_COMMUNITY)
Admission: RE | Admit: 2022-10-02 | Discharge: 2022-10-02 | Disposition: A | Discharge status: Home / Self Care | Payer: Managed Care, Other (non HMO) | Source: Ambulatory Visit | Attending: Neurology | Admitting: Neurology

## 2022-10-02 DIAGNOSIS — G35 Multiple sclerosis: Secondary | ICD-10-CM | POA: Insufficient documentation

## 2022-10-02 MED ORDER — GADOBUTROL 1 MMOL/ML IV SOLN
10.0000 mL | Freq: Once | INTRAVENOUS | Status: AC | PRN
  Administered 2022-10-02: 10 mL via INTRAVENOUS

## 2022-10-09 ENCOUNTER — Ambulatory Visit: Payer: 59 | Admitting: Psychology

## 2022-10-09 DIAGNOSIS — F331 Major depressive disorder, recurrent, moderate: Secondary | ICD-10-CM

## 2022-10-19 ENCOUNTER — Other Ambulatory Visit: Payer: Self-pay | Admitting: Neurology

## 2022-10-23 ENCOUNTER — Ambulatory Visit (INDEPENDENT_AMBULATORY_CARE_PROVIDER_SITE_OTHER): Payer: 59 | Admitting: Psychology

## 2022-10-23 DIAGNOSIS — F331 Major depressive disorder, recurrent, moderate: Secondary | ICD-10-CM

## 2022-10-23 NOTE — Progress Notes (Signed)
[facility] Counselor/Therapist Progress Note  Patient ID: NESBIT MICHON, MRN: [id number],    Date: 10/23/2022  Time Spent: 3:00pm - 3:45pm  45  minutes   Treatment Type: Individual Therapy  Reported Symptoms: stress  Mental Status Exam: Appearance:  Casual     Behavior: Appropriate  Motor: Normal  Speech/Language:  Normal Rate  Affect: Appropriate  Mood: normal  Thought process: normal  Thought content:   WNL  Sensory/Perceptual disturbances:   WNL  Orientation: oriented to person, place, time/date, and situation  Attention: Good  Concentration: Good  Memory: WNL  Fund of knowledge:  Good  Insight:   Good  Judgment:  Good  Impulse Control: Good   Risk Assessment: Danger to Self:  No Self-injurious Behavior: No Danger to Others: No Duty to Warn:no Physical Aggression / Violence:No  Access to Firearms a concern: No  Gang Involvement:No   Subjective:  Pt present for face-to-face individual therapy via video Webex.  Pt consents to telehealth video session due to COVID 19 pandemic. Location of pt: home Location of therapist: home office.   Pt talked about work.  He is on vacation until December 4th.  He is working on the house to get it ready for company for Thanksgiving.   There is still a lot of preparation to be done for Thanksgiving.  Addressed the stress of this.   Pt talked about his wife [name].   He is feeling frustrated with her bc she is not helping as much as pt would like.  Pt gets a short temper at times.  Worked on Warden/ranger and Pharmacologist.   Provided supportive therapy.   Interventions: Cognitive Behavioral Therapy and Insight-Oriented  Diagnosis: F33.1  Plan of Care: Recommend ongoing therapy.   Plan to meet every two weeks.   Pt is progressing toward goals.    Treatment Plan  (Treatment Plan Target Date: 03/27/2023) Client Abilities/Strengths  Pt is bright, engaging, and motivated for therapy.   Client Treatment  Preferences  Individual therapy.  Client Statement of Needs  Improve coping skills.  Symptoms  Depressed or irritable mood. Feelings of hopelessness, worthlessness, or inappropriate guilt. Low self-esteem.  Problems Addressed  Unipolar Depression Goals 1. Alleviate depressive symptoms and return to previous level of effective functioning. 2. Appropriately grieve the loss in order to normalize mood and to return to previously adaptive level of functioning. Objective Learn and implement behavioral strategies to overcome depression. Target Date: 2023-03-27 Frequency: Biweekly  Progress: 50 Modality: individual  Related Interventions Engage the client in "behavioral activation," increasing his/her activity level and contact with sources of reward, while identifying processes that inhibit activation.  Use behavioral techniques such as instruction, rehearsal, role-playing, role reversal, as needed, to facilitate activity in the client's daily life; reinforce success. Assist the client in developing skills that increase the likelihood of deriving pleasure from behavioral activation (e.g., assertiveness skills, developing an exercise plan, less internal/more external focus, increased social involvement); reinforce success. Objective Identify important people in life, past and present, and describe the quality, good and poor, of those relationships. Target Date: 2023-03-27 Frequency: Biweekly  Progress: 50 Modality: individual  Related Interventions Conduct Interpersonal Therapy beginning with the assessment of the client's "interpersonal inventory" of important past and present relationships; develop a case formulation linking depression to grief, interpersonal role disputes, role transitions, and/or interpersonal deficits). Objective Learn and implement problem-solving and decision-making skills. Target Date: 2023-03-27 Frequency: Biweekly  Progress: 50 Modality: individual  Related  Interventions Conduct Problem-Solving Therapy  using techniques such as psychoeducation, modeling, and role-playing to teach client problem-solving skills (i.e., defining a problem specifically, generating possible solutions, evaluating the pros and cons of each solution, selecting and implementing a plan of action, evaluating the efficacy of the plan, accepting or revising the plan); role-play application of the problem-solving skill to a real life issue. Encourage in the client the development of a positive problem orientation in which problems and solving them are viewed as a natural part of life and not something to be feared, despaired, or avoided. 3. Develop healthy interpersonal relationships that lead to the alleviation and help prevent the relapse of depression. 4. Develop healthy thinking patterns and beliefs about self, others, and the world that lead to the alleviation and help prevent the relapse of depression. 5. Recognize, accept, and cope with feelings of depression. Diagnosis F33.1  Conditions For Discharge Achievement of treatment goals and objectives   Clint Bolder, LCSW

## 2022-11-13 ENCOUNTER — Ambulatory Visit (INDEPENDENT_AMBULATORY_CARE_PROVIDER_SITE_OTHER): Payer: 59 | Admitting: Psychology

## 2022-11-13 DIAGNOSIS — F331 Major depressive disorder, recurrent, moderate: Secondary | ICD-10-CM

## 2022-11-13 NOTE — Progress Notes (Signed)
[facility] Counselor/Therapist Progress Note  Patient ID: Keith Morrow, MRN: [id number],    Date: 11/13/2022  Time Spent: 2:00pm - 2:50pm  50 minutes   Treatment Type: Individual Therapy  Reported Symptoms: stress  Mental Status Exam: Appearance:  Casual     Behavior: Appropriate  Motor: Normal  Speech/Language:  Normal Rate  Affect: Appropriate  Mood: normal  Thought process: normal  Thought content:   WNL  Sensory/Perceptual disturbances:   WNL  Orientation: oriented to person, place, time/date, and situation  Attention: Good  Concentration: Good  Memory: WNL  Fund of knowledge:  Good  Insight:   Good  Judgment:  Good  Impulse Control: Good   Risk Assessment: Danger to Self:  No Self-injurious Behavior: No Danger to Others: No Duty to Warn:no Physical Aggression / Violence:No  Access to Firearms a concern: No  Gang Involvement:No   Subjective:  Pt present for face-to-face individual therapy via video Webex.  Pt consents to telehealth video session due to COVID 19 pandemic. Location of pt: home Location of therapist: home office.   Pt talked about his health.  He has been dealing with swelling in his legs.  It is painful when they swell.    Pt talked about work.   He had to travel for work. Pt talked about his relationship with [name].  He gets frustrated that [name] is often tired and does not communicate with him as much as he would like.   Helped pt process his feelings and relationship dynamics.   Pt talked about Thanksgiving.   He had family guests and there was some stress due to one of the dogs running off and [name]'s mother needing to go to the ER.   Addressed the stress around Thanksgiving.   Worked on Warden/ranger and Pharmacologist.   Provided supportive therapy.   Interventions: Cognitive Behavioral Therapy and Insight-Oriented  Diagnosis: F33.1  Plan of Care: Recommend ongoing therapy.   Plan to meet every two weeks.   Pt is  progressing toward goals.    Treatment Plan  (Treatment Plan Target Date: 03/27/2023) Client Abilities/Strengths  Pt is bright, engaging, and motivated for therapy.   Client Treatment Preferences  Individual therapy.  Client Statement of Needs  Improve coping skills.  Symptoms  Depressed or irritable mood. Feelings of hopelessness, worthlessness, or inappropriate guilt. Low self-esteem.  Problems Addressed  Unipolar Depression Goals 1. Alleviate depressive symptoms and return to previous level of effective functioning. 2. Appropriately grieve the loss in order to normalize mood and to return to previously adaptive level of functioning. Objective Learn and implement behavioral strategies to overcome depression. Target Date: 2023-03-27 Frequency: Biweekly  Progress: 50 Modality: individual  Related Interventions Engage the client in "behavioral activation," increasing his/her activity level and contact with sources of reward, while identifying processes that inhibit activation.  Use behavioral techniques such as instruction, rehearsal, role-playing, role reversal, as needed, to facilitate activity in the client's daily life; reinforce success. Assist the client in developing skills that increase the likelihood of deriving pleasure from behavioral activation (e.g., assertiveness skills, developing an exercise plan, less internal/more external focus, increased social involvement); reinforce success. Objective Identify important people in life, past and present, and describe the quality, good and poor, of those relationships. Target Date: 2023-03-27 Frequency: Biweekly  Progress: 50 Modality: individual  Related Interventions Conduct Interpersonal Therapy beginning with the assessment of the client's "interpersonal inventory" of important past and present relationships; develop a case formulation linking depression to  grief, interpersonal role disputes, role transitions, and/or interpersonal  deficits). Objective Learn and implement problem-solving and decision-making skills. Target Date: 2023-03-27 Frequency: Biweekly  Progress: 50 Modality: individual  Related Interventions Conduct Problem-Solving Therapy using techniques such as psychoeducation, modeling, and role-playing to teach client problem-solving skills (i.e., defining a problem specifically, generating possible solutions, evaluating the pros and cons of each solution, selecting and implementing a plan of action, evaluating the efficacy of the plan, accepting or revising the plan); role-play application of the problem-solving skill to a real life issue. Encourage in the client the development of a positive problem orientation in which problems and solving them are viewed as a natural part of life and not something to be feared, despaired, or avoided. 3. Develop healthy interpersonal relationships that lead to the alleviation and help prevent the relapse of depression. 4. Develop healthy thinking patterns and beliefs about self, others, and the world that lead to the alleviation and help prevent the relapse of depression. 5. Recognize, accept, and cope with feelings of depression. Diagnosis F33.1  Conditions For Discharge Achievement of treatment goals and objectives   Salomon Fick, LCSW

## 2022-11-27 ENCOUNTER — Ambulatory Visit (INDEPENDENT_AMBULATORY_CARE_PROVIDER_SITE_OTHER): Payer: 59 | Admitting: Psychology

## 2022-11-27 DIAGNOSIS — F331 Major depressive disorder, recurrent, moderate: Secondary | ICD-10-CM | POA: Diagnosis not present

## 2022-11-27 NOTE — Progress Notes (Signed)
[facility] Counselor/Therapist Progress Note  Patient ID: Keith Morrow, MRN: [id number],    Date: 11/27/2022  Time Spent: 11:00am-11:50am   50 minutes   Treatment Type: Individual Therapy  Reported Symptoms: stress  Mental Status Exam: Appearance:  Casual     Behavior: Appropriate  Motor: Normal  Speech/Language:  Normal Rate  Affect: Appropriate  Mood: normal  Thought process: normal  Thought content:   WNL  Sensory/Perceptual disturbances:   WNL  Orientation: oriented to person, place, time/date, and situation  Attention: Good  Concentration: Good  Memory: WNL  Fund of knowledge:  Good  Insight:   Good  Judgment:  Good  Impulse Control: Good   Risk Assessment: Danger to Self:  No Self-injurious Behavior: No Danger to Others: No Duty to Warn:no Physical Aggression / Violence:No  Access to Firearms a concern: No  Gang Involvement:No   Subjective:  Pt present for face-to-face individual therapy via video Webex.  Pt consents to telehealth video session due to COVID 19 pandemic. Location of pt: home Location of therapist: home office.   Pt talked about preparing for Christmas.  It has been a little stressful.   Keith Morrow has been at her mother's house in the evenings wrapping presents so pt and Keith Morrow have had to decorate for Christmas.  Worked on Optician, dispensing. Pt talked about work.  He has a couple of weeks off for the holidays.   Pt states that overall he has had a good two weeks.  Provided supportive therapy.   Interventions: Cognitive Behavioral Therapy and Insight-Oriented  Diagnosis: F33.1  Plan of Care: Recommend ongoing therapy.   Plan to meet every two weeks.   Pt is progressing toward goals.    Treatment Plan  (Treatment Plan Target Date: 03/27/2023) Client Abilities/Strengths  Pt is bright, engaging, and motivated for therapy.   Client Treatment Preferences  Individual therapy.  Client Statement of Needs  Improve coping skills.   Symptoms  Depressed or irritable mood. Feelings of hopelessness, worthlessness, or inappropriate guilt. Low self-esteem.  Problems Addressed  Unipolar Depression Goals 1. Alleviate depressive symptoms and return to previous level of effective functioning. 2. Appropriately grieve the loss in order to normalize mood and to return to previously adaptive level of functioning. Objective Learn and implement behavioral strategies to overcome depression. Target Date: 2023-03-27 Frequency: Biweekly  Progress: 50 Modality: individual  Related Interventions Engage the client in "behavioral activation," increasing his/her activity level and contact with sources of reward, while identifying processes that inhibit activation.  Use behavioral techniques such as instruction, rehearsal, role-playing, role reversal, as needed, to facilitate activity in the client's daily life; reinforce success. Assist the client in developing skills that increase the likelihood of deriving pleasure from behavioral activation (e.g., assertiveness skills, developing an exercise plan, less internal/more external focus, increased social involvement); reinforce success. Objective Identify important people in life, past and present, and describe the quality, good and poor, of those relationships. Target Date: 2023-03-27 Frequency: Biweekly  Progress: 50 Modality: individual  Related Interventions Conduct Interpersonal Therapy beginning with the assessment of the client's "interpersonal inventory" of important past and present relationships; develop a case formulation linking depression to grief, interpersonal role disputes, role transitions, and/or interpersonal deficits). Objective Learn and implement problem-solving and decision-making skills. Target Date: 2023-03-27 Frequency: Biweekly  Progress: 50 Modality: individual  Related Interventions Conduct Problem-Solving Therapy using techniques such as psychoeducation,  modeling, and role-playing to teach client problem-solving skills (i.e., defining a problem specifically, generating possible solutions, evaluating the  pros and cons of each solution, selecting and implementing a plan of action, evaluating the efficacy of the plan, accepting or revising the plan); role-play application of the problem-solving skill to a real life issue. Encourage in the client the development of a positive problem orientation in which problems and solving them are viewed as a natural part of life and not something to be feared, despaired, or avoided. 3. Develop healthy interpersonal relationships that lead to the alleviation and help prevent the relapse of depression. 4. Develop healthy thinking patterns and beliefs about self, others, and the world that lead to the alleviation and help prevent the relapse of depression. 5. Recognize, accept, and cope with feelings of depression. Diagnosis F33.1  Conditions For Discharge Achievement of treatment goals and objectives   Salomon Fick, LCSW

## 2022-12-03 ENCOUNTER — Other Ambulatory Visit: Payer: Self-pay | Admitting: Neurology

## 2022-12-03 MED ORDER — METHYLPHENIDATE HCL 10 MG PO TABS: 10.0000 mg | ORAL_TABLET | Freq: Four times a day (QID) | ORAL | 0 refills | Status: DC

## 2022-12-10 ENCOUNTER — Telehealth: Payer: Self-pay | Admitting: *Deleted

## 2022-12-18 ENCOUNTER — Ambulatory Visit (INDEPENDENT_AMBULATORY_CARE_PROVIDER_SITE_OTHER): Payer: 59 | Admitting: Psychology

## 2022-12-18 DIAGNOSIS — F331 Major depressive disorder, recurrent, moderate: Secondary | ICD-10-CM

## 2022-12-18 NOTE — Progress Notes (Signed)
[place] Counselor/Therapist Progress Note  Patient ID: SEFERINO OSCAR, MRN: [id number],    Date: 12/18/2022  Time Spent: 11:00am-11:50am   50 minutes   Treatment Type: Individual Therapy  Reported Symptoms: stress  Mental Status Exam: Appearance:  Casual     Behavior: Appropriate  Motor: Normal  Speech/Language:  Normal Rate  Affect: Appropriate  Mood: normal  Thought process: normal  Thought content:   WNL  Sensory/Perceptual disturbances:   WNL  Orientation: oriented to person, place, time/date, and situation  Attention: Good  Concentration: Good  Memory: WNL  Fund of knowledge:  Good  Insight:   Good  Judgment:  Good  Impulse Control: Good   Risk Assessment: Danger to Self:  No Self-injurious Behavior: No Danger to Others: No Duty to Warn:no Physical Aggression / Violence:No  Access to Firearms a concern: No  Gang Involvement:No   Subjective:  Pt present for face-to-face individual therapy via video Webex.  Pt consents to telehealth video session due to COVID 19 pandemic. Location of pt: home Location of therapist: home office.   Pt talked about concerns about his kids getting into the classes they need for college.   Pt has had to help his kids problem solve.  He feels stress about this.   Pt talked about having a big emotional stressor a couple of days ago.   The dogs got covered in deer poop and got sprayed by a skunk and pt had to deal with that and was angry and upset.   Addressed how pt coped with the stress. Pt talked about work.  It has been hard for him to go back to work in January after having so much time off in December.  Worked on Child psychotherapist.  Pt talked about the holidays.   He states Christmas went well.   Pt talked about his health.  He has been cramping due to his MS.   Provided supportive therapy.   Interventions: Cognitive Behavioral Therapy and Insight-Oriented  Diagnosis: F33.1  Plan of Care: Recommend ongoing  therapy.   Plan to meet every two weeks.   Pt is progressing toward goals.    Treatment Plan  (Treatment Plan Target Date: 03/27/2023) Client Abilities/Strengths  Pt is bright, engaging, and motivated for therapy.   Client Treatment Preferences  Individual therapy.  Client Statement of Needs  Improve coping skills.  Symptoms  Depressed or irritable mood. Feelings of hopelessness, worthlessness, or inappropriate guilt. Low self-esteem.  Problems Addressed  Unipolar Depression Goals 1. Alleviate depressive symptoms and return to previous level of effective functioning. 2. Appropriately grieve the loss in order to normalize mood and to return to previously adaptive level of functioning. Objective Learn and implement behavioral strategies to overcome depression. Target Date: 2023-03-27 Frequency: Biweekly  Progress: 50 Modality: individual  Related Interventions Engage the client in "behavioral activation," increasing his/her activity level and contact with sources of reward, while identifying processes that inhibit activation.  Use behavioral techniques such as instruction, rehearsal, role-playing, role reversal, as needed, to facilitate activity in the client's daily life; reinforce success. Assist the client in developing skills that increase the likelihood of deriving pleasure from behavioral activation (e.g., assertiveness skills, developing an exercise plan, less internal/more external focus, increased social involvement); reinforce success. Objective Identify important people in life, past and present, and describe the quality, good and poor, of those relationships. Target Date: 2023-03-27 Frequency: Biweekly  Progress: 50 Modality: individual  Related Interventions Conduct Interpersonal Therapy beginning with the assessment of  the client's "interpersonal inventory" of important past and present relationships; develop a case formulation linking depression to grief, interpersonal role  disputes, role transitions, and/or interpersonal deficits). Objective Learn and implement problem-solving and decision-making skills. Target Date: 2023-03-27 Frequency: Biweekly  Progress: 50 Modality: individual  Related Interventions Conduct Problem-Solving Therapy using techniques such as psychoeducation, modeling, and role-playing to teach client problem-solving skills (i.e., defining a problem specifically, generating possible solutions, evaluating the pros and cons of each solution, selecting and implementing a plan of action, evaluating the efficacy of the plan, accepting or revising the plan); role-play application of the problem-solving skill to a real life issue. Encourage in the client the development of a positive problem orientation in which problems and solving them are viewed as a natural part of life and not something to be feared, despaired, or avoided. 3. Develop healthy interpersonal relationships that lead to the alleviation and help prevent the relapse of depression. 4. Develop healthy thinking patterns and beliefs about self, others, and the world that lead to the alleviation and help prevent the relapse of depression. 5. Recognize, accept, and cope with feelings of depression. Diagnosis F33.1  Conditions For Discharge Achievement of treatment goals and objectives   Clint Bolder, LCSW

## 2023-01-01 ENCOUNTER — Ambulatory Visit (INDEPENDENT_AMBULATORY_CARE_PROVIDER_SITE_OTHER): Payer: 59 | Admitting: Psychology

## 2023-01-01 ENCOUNTER — Ambulatory Visit: Payer: Managed Care, Other (non HMO) | Admitting: Family Medicine

## 2023-01-01 DIAGNOSIS — F331 Major depressive disorder, recurrent, moderate: Secondary | ICD-10-CM | POA: Diagnosis not present

## 2023-01-01 NOTE — Progress Notes (Signed)
[place] Counselor/Therapist Progress Note  Patient ID: Keith Morrow, MRN: [id number],    Date: 01/01/2023  Time Spent: 11:00am-11:50am   50 minutes   Treatment Type: Individual Therapy  Reported Symptoms: stress  Mental Status Exam: Appearance:  Casual     Behavior: Appropriate  Motor: Normal  Speech/Language:  Normal Rate  Affect: Appropriate  Mood: normal  Thought process: normal  Thought content:   WNL  Sensory/Perceptual disturbances:   WNL  Orientation: oriented to person, place, time/date, and situation  Attention: Good  Concentration: Good  Memory: WNL  Fund of knowledge:  Good  Insight:   Good  Judgment:  Good  Impulse Control: Good   Risk Assessment: Danger to Self:  No Self-injurious Behavior: No Danger to Others: No Duty to Warn:no Physical Aggression / Violence:No  Access to Firearms a concern: No  Gang Involvement:No   Subjective:  Pt present for face-to-face individual therapy via video Webex.  Pt consents to telehealth video session due to COVID 19 pandemic. Location of pt: home Location of therapist: home office.   Pt talked about work.  Work has been very busy.  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  Pt's handicap Lucianne Lei is finally repaired so pt has been able to go into the office.   It has helped to reduce his isolation.    Pt talked about going out with Lelon Frohlich this Saturday on a date to see a show.    Pt talked about having problems with "overstimulation".   When he gets home the dogs jump at him and pt gets frustrated and angry.  Helped pt process and problem solve the issues.  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  This is very sad for pt.  Helped pt process his grief.  Provided supportive therapy.   Interventions: Cognitive Behavioral Therapy and Insight-Oriented  Diagnosis: F33.1  Plan of Care: Recommend ongoing therapy.   Plan to meet every two weeks.   Pt is progressing toward  goals.    Treatment Plan  (Treatment Plan Target Date: 03/27/2023) Client Abilities/Strengths  Pt is bright, engaging, and motivated for therapy.   Client Treatment Preferences  Individual therapy.  Client Statement of Needs  Improve coping skills.  Symptoms  Depressed or irritable mood. Feelings of hopelessness, worthlessness, or inappropriate guilt. Low self-esteem.  Problems Addressed  Unipolar Depression Goals 1. Alleviate depressive symptoms and return to previous level of effective functioning. 2. Appropriately grieve the loss in order to normalize mood and to return to previously adaptive level of functioning. Objective Learn and implement behavioral strategies to overcome depression. Target Date: 2023-03-27 Frequency: Biweekly  Progress: 50 Modality: individual  Related Interventions Engage the client in "behavioral activation," increasing his/her activity level and contact with sources of reward, while identifying processes that inhibit activation.  Use behavioral techniques such as instruction, rehearsal, role-playing, role reversal, as needed, to facilitate activity in the client's daily life; reinforce success. Assist the client in developing skills that increase the likelihood of deriving pleasure from behavioral activation (e.g., assertiveness skills, developing an exercise plan, less internal/more external focus, increased social involvement); reinforce success. Objective Identify important people in life, past and present, and describe the quality, good and poor, of those relationships. Target Date: 2023-03-27 Frequency: Biweekly  Progress: 50 Modality: individual  Related Interventions Conduct Interpersonal Therapy beginning with the assessment of the client's "interpersonal inventory" of important past and present relationships; develop a case formulation linking depression to grief, interpersonal role  disputes, role transitions, and/or interpersonal  deficits). Objective Learn and implement problem-solving and decision-making skills. Target Date: 2023-03-27 Frequency: Biweekly  Progress: 50 Modality: individual  Related Interventions Conduct Problem-Solving Therapy using techniques such as psychoeducation, modeling, and role-playing to teach client problem-solving skills (i.e., defining a problem specifically, generating possible solutions, evaluating the pros and cons of each solution, selecting and implementing a plan of action, evaluating the efficacy of the plan, accepting or revising the plan); role-play application of the problem-solving skill to a real life issue. Encourage in the client the development of a positive problem orientation in which problems and solving them are viewed as a natural part of life and not something to be feared, despaired, or avoided. 3. Develop healthy interpersonal relationships that lead to the alleviation and help prevent the relapse of depression. 4. Develop healthy thinking patterns and beliefs about self, others, and the world that lead to the alleviation and help prevent the relapse of depression. 5. Recognize, accept, and cope with feelings of depression. Diagnosis F33.1  Conditions For Discharge Achievement of treatment goals and objectives   Clint Bolder, LCSW

## 2023-01-13 ENCOUNTER — Ambulatory Visit: Payer: Managed Care, Other (non HMO) | Admitting: Family

## 2023-01-13 VITALS — BP 113/65 | HR 73 | Temp 97.6°F | Resp 16

## 2023-01-13 DIAGNOSIS — F418 Other specified anxiety disorders: Secondary | ICD-10-CM | POA: Diagnosis not present

## 2023-01-13 DIAGNOSIS — E038 Other specified hypothyroidism: Secondary | ICD-10-CM

## 2023-01-13 DIAGNOSIS — E349 Endocrine disorder, unspecified: Secondary | ICD-10-CM

## 2023-01-13 DIAGNOSIS — E782 Mixed hyperlipidemia: Secondary | ICD-10-CM

## 2023-01-13 DIAGNOSIS — E039 Hypothyroidism, unspecified: Secondary | ICD-10-CM

## 2023-01-13 DIAGNOSIS — K219 Gastro-esophageal reflux disease without esophagitis: Secondary | ICD-10-CM | POA: Diagnosis not present

## 2023-01-13 DIAGNOSIS — K5909 Other constipation: Secondary | ICD-10-CM

## 2023-01-13 DIAGNOSIS — G4733 Obstructive sleep apnea (adult) (pediatric): Secondary | ICD-10-CM

## 2023-01-13 DIAGNOSIS — N400 Enlarged prostate without lower urinary tract symptoms: Secondary | ICD-10-CM

## 2023-01-13 DIAGNOSIS — R739 Hyperglycemia, unspecified: Secondary | ICD-10-CM

## 2023-01-13 DIAGNOSIS — G35 Multiple sclerosis: Secondary | ICD-10-CM

## 2023-01-13 DIAGNOSIS — I10 Essential (primary) hypertension: Secondary | ICD-10-CM

## 2023-01-13 DIAGNOSIS — R600 Localized edema: Secondary | ICD-10-CM

## 2023-01-13 LAB — LIPID PANEL
Cholesterol: 136 mg/dL (ref 0–200)
HDL: 46.3 mg/dL (ref >39.00)
LDL Cholesterol: 77 mg/dL (ref 0–99)
NonHDL: 89.69
Total CHOL/HDL Ratio: 3
Triglycerides: 62 mg/dL (ref 0.0–149.0)
VLDL: 12.4 mg/dL (ref 0.0–40.0)

## 2023-01-13 LAB — COMPREHENSIVE METABOLIC PANEL
ALT: 15 U/L (ref 0–53)
AST: 13 U/L (ref 0–37)
Albumin: 4.2 g/dL (ref 3.5–5.2)
Alkaline Phosphatase: 78 U/L (ref 39–117)
BUN: 23 mg/dL (ref 6–23)
CO2: 29 mEq/L (ref 19–32)
Calcium: 8.7 mg/dL (ref 8.4–10.5)
Chloride: 102 mEq/L (ref 96–112)
Creatinine, Ser: 0.92 mg/dL (ref 0.40–1.50)
GFR: 97.42 mL/min (ref >60.00)
Glucose, Bld: 102 mg/dL — ABNORMAL HIGH (ref 70–99)
Potassium: 3.9 mEq/L (ref 3.5–5.1)
Sodium: 142 mEq/L (ref 135–145)
Total Bilirubin: 0.4 mg/dL (ref 0.2–1.2)
Total Protein: 6.5 g/dL (ref 6.0–8.3)

## 2023-01-13 LAB — HEMOGLOBIN A1C: Hgb A1c MFr Bld: 5.8 % (ref 4.6–6.5)

## 2023-01-13 LAB — TSH: TSH: 0.66 u[IU]/mL (ref 0.35–5.50)

## 2023-01-13 MED ORDER — PANTOPRAZOLE SODIUM 40 MG PO TBEC: 40.0000 mg | DELAYED_RELEASE_TABLET | Freq: Every day | ORAL | 1 refills | Status: DC

## 2023-01-13 MED ORDER — FUROSEMIDE 20 MG PO TABS: 20.0000 mg | ORAL_TABLET | Freq: Every day | ORAL | 1 refills | Status: DC

## 2023-01-13 NOTE — Assessment & Plan Note (Signed)
BP Readings from Last 3 Encounters:  01/13/23 113/65  09/26/22 117/60  09/04/22 120/73   At goal continue [facility].

## 2023-01-13 NOTE — Progress Notes (Signed)
Subjective:   By signing my name below, I, Keith Morrow, attest that this documentation has been prepared under the direction and in the presence of Debbrah Alar, NP. 01/13/2023.   Patient ID: Keith Morrow, male    DOB: [birth year], 52 y.o.   MRN: [id number]  Chief Complaint  Patient presents with   Hypertension    Here for follow up   Hypothyroidism    Here for follow up   Gastroesophageal Reflux    Here for follow up, needs to discuss medication    HPI Patient is in today for an office visit.  Acid Reflux/Medications: He reports that omeprazole and famotidine are no longer covered by his insurance, including omeprazole and famotidine. In the interim he has managed to obtain his medication using [facility]. His acid reflux is guaranteed if he does not take omeprazole in the morning. Generally he suffers from acid reflux at least once a day. He is amenable to trying pantoprazole, 90-day supply. Of note, he admits to not taking sucralfate 4 times a day.  MS: He has noticed further loss of muscle strength. At times, he experiences a transient loss of strength. After driving for 6 hours one day, he was unable to get out of his wheelchair. The following day his normal strength returned. Additionally the weakness/discomfort is more bothersome in his right hand. He has needed to use his left hand although he is typically right-handed.  Mood:   Generally he has been in a stable mood. He is compliant with Buspar, Cymbalta, and Klonopin. He has a routine of taking these before his 2 dogs become wound up and he starts feeling overwhelmed.  Edema: At this time he is taking Lasix daily. He states this is managing his edema well. His level of swelling is less than it has been in the past, but may depend on his activity throughout the day.  Breathing: His breathing is stable. He states he never had any breathing problems  Diet: This past Fall his A1c was normal. He is doing better with  following a healthy diet now that sugary Holiday foods are out of the house. Lately he is following a diet with intermittent fasting. Around 11 AM he has oatmeal. He will have a small snack such as almonds around 4 PM. Usually he has to make sure not to overeat at dinner. He often has fish or chicken.  Testosterone: Has not been treating his ED for the last several years as he states it is now permanent. He is not willing to resume injection therapies.  Bowel Movements: Normally his bowels are hard. We discussed trying Miralax.  Denies having any fever, new moles, congestion, sinus pain, sore throat, chest pain, palpations, cough, SOB ,wheezing, n/v/d constipation, blood in stool, dysuria, frequency, hematuria, at this time   Past Medical History:  Diagnosis Date   Acute bronchitis 05/09/2013   Allergy    Bronchitis, acute 12/10/2011   Candidal skin infection 05/05/2017   Chicken pox as a child   Constipation 02/20/2017   Coronary atherosclerosis of native coronary artery    a. NSTEMI 04/20/11: tx with Promus DES to LAD; bifurcation lesion with oDx 90% tx with POBA; b. staged PCI of right post. AV Branch with Promus DES; c. 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   Visual changes 11/13/2016    Past Surgical History:  Procedure Laterality Date   CORONARY ANGIOPLASTY WITH STENT PLACEMENT     2012   PAIN PUMP IMPLANTATION N/A  08/29/2014   Procedure: Baclofen pump placement;  Surgeon: Erline Levine, MD;  Location: [facility];  Service: Neurosurgery;  Laterality: N/A;  Baclofen pump placement   PAIN PUMP IMPLANTATION Right 09/20/2020   Procedure: Right Baclofen pump replacement;  Surgeon: Erline Levine, MD;  Location: [facility];  Service: Neurosurgery;  Laterality: Right;  Right Baclofen pump replacement   PROGRAMABLE BACLOFEN PUMP REVISION  08/29/14   right wrist surgery     CTR   VASCULAR SURGERY     vericose vein in right femoral area   VASECTOMY     WISDOM TOOTH EXTRACTION      Family History  Problem Relation Age of Onset   Heart attack Mother 68   Thyroid disease Mother    Heart disease Mother    Thyroid disease Sister    Thyroid disease Brother    Heart disease Maternal Grandmother    Hypertension Maternal Grandmother    Hyperlipidemia Maternal Grandmother    Heart disease Maternal Grandfather    Hypertension Maternal Grandfather    Hyperlipidemia Maternal Grandfather    Stroke Paternal Grandfather    Thyroid disease Sister    Heart disease Father        2 stents   Coronary artery disease Other        questionable in father    Social History   Socioeconomic History   Marital status: Married    Spouse name: Not on file   Number of children: 3   Years of education: Not on file   Highest education level: Not on file  Occupational History   Not on file  Tobacco Use   Smoking status: Never   Smokeless tobacco: Never  Vaping Use   Vaping Use: Never used  Substance and Sexual Activity   Alcohol use: Yes    Comment: socially   Drug use: No   Sexual activity: Yes    Partners: Female    Comment: 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, works Transport planner, no dietary restrictions  Other Topics Concern   Not on file  Social History Narrative   Not on file   Social Determinants of Health   Financial Resource Strain: Not on file  Food Insecurity: Not on file  Transportation Needs: Not on file   Physical Activity: Not on file  Stress: Not on file  Social Connections: Not on file  Intimate Partner Violence: Not on file    Outpatient Medications Prior to Visit  Medication Sig Dispense Refill   aspirin EC 81 MG tablet Take 81 mg by mouth daily.     atorvastatin (LIPITOR) 40 MG tablet Take 1 tablet (40 mg total) by mouth daily at 6 PM. 90 tablet 3   BACLOFEN IT 1 Dose by Intrathecal route as directed.      busPIRone (BUSPAR) 10  MG tablet TAKE 1 TABLET THREE TIMES A DAY 270 tablet 3   cetirizine (ZYRTEC) 10 MG tablet Take 10 mg by mouth daily as needed for allergies.     clonazePAM (KLONOPIN) 1 MG tablet Take 1 tablet (1 mg total) by mouth 4 (four) times daily. 360 tablet 1   Clonidine HCl POWD by Does not apply route. 720mg /ml IT Baclofen. Total dose 157.89mg /day. Last pump refill 06/02/16     clotrimazole-betamethasone (LOTRISONE) cream Apply 1 application topically 2 (two) times daily as needed (anti-fungal). 30 g 2   cyclobenzaprine (FLEXERIL) 10 MG tablet TAKE 1 TABLET BY MOUTH THREE TIMES A DAY AS NEEDED FOR MUSCLE SPASMS 90 tablet 11   CYMBALTA 60 MG capsule TAKE 1 CAPSULE TWICE A DAY 180 capsule 3   dalfampridine 10 MG TB12 Take 1 tablet (10 mg total) by mouth every 12 (twelve) hours. 60 tablet 5   EPINEPHrine (ADRENALIN) 1 MG/ML SOLN Inject into the skin.     famotidine (PEPCID) 40 MG tablet TAKE 1 TABLET DAILY AT BEDTIME 90 tablet 3   gabapentin (NEURONTIN) 600 MG tablet Take 1 tablet (600 mg total) by mouth 4 (four) times daily. 360 tablet 1   ibuprofen (ADVIL,MOTRIN) 200 MG tablet Take 200 mg by mouth every 6 (six) hours as needed for moderate pain.     ketoconazole (NIZORAL) 2 % shampoo Apply topically 2 (two) times a week. 120 mL 5   levothyroxine (SYNTHROID) 137 MCG tablet TAKE 1 TABLET DAILY BEFORE BREAKFAST 90 tablet 3   methylphenidate (RITALIN) 10 MG tablet Take 1 tablet (10 mg total) by mouth 4 (four) times daily. 120 tablet 0   mupirocin ointment (BACTROBAN) 2 %  Apply 1 Application topically 2 (two) times daily. 22 g 0   natalizumab (TYSABRI) 300 MG/15ML injection Inject 300 mg into the vein every 28 (twenty-eight) days.      nitroGLYCERIN (NITROSTAT) 0.4 MG SL tablet PLACE 1 TABLET UNDER THE TOUNGE EVERY 5 MINUTES AS NEEDED FOR CHEST PAIN (X 3 DOSES) 25 tablet 0   nystatin cream (MYCOSTATIN) Apply 1 application  topically as directed. 30 g 2   olmesartan-hydrochlorothiazide (BENICAR HCT) 20-12.5 MG tablet TAKE 1 TABLET DAILY 90 tablet 3   potassium chloride SA (KLOR-CON M20) 20 MEQ tablet TAKE 1 TABLET BY MOUTH EVERY DAY AS NEEDED WHEN TAKING A SECOND LASIX TAB ANY GIVEN DAY 90 tablet 3   sucralfate (CARAFATE) 1 g tablet TAKE 1 TABLET BY MOUTH 4 TIMES DAILY - WITH MEALS AND AT BEDTIME. TAKE TABLET OR LIQUID BUT NOT BOTH 120 tablet 1   sucralfate (CARAFATE) 1 GM/10ML suspension Take 10 mLs (1 g total) by mouth 4 (four) times daily -  with meals and at bedtime. Take liquid or tablets but not both 420 mL 3   tamsulosin (FLOMAX) 0.4 MG CAPS capsule TAKE 1 CAPSULE DAILY 90 capsule 3   furosemide (LASIX) 20 MG tablet 1 ta po daily x 5 days and then as needed for SOB, weight gain>3#, pedal edema and can take a second tab daily as needed 45 tablet 3   omeprazole (PRILOSEC) 40 MG capsule TAKE 1 CAPSULE DAILY 90 capsule 3   No facility-administered medications prior to visit.    No Known Allergies  Review of Systems  Constitutional:  Negative for fever.  HENT:  Negative for congestion, sinus pain and sore throat.   Respiratory:  Negative for cough, shortness of breath and wheezing.   Cardiovascular:  Positive for leg swelling. Negative for  chest pain and palpitations.  Gastrointestinal:  Positive for heartburn. Negative for blood in stool, constipation, diarrhea, nausea and vomiting.  Genitourinary:  Negative for dysuria, frequency and hematuria.  Musculoskeletal:  Negative for joint pain and myalgias.       Objective:    Physical Exam Constitutional:       General: He is not in acute distress.    Appearance: Normal appearance. He is not ill-appearing.     Comments: In wheelchair.  HENT:     Head: Normocephalic and atraumatic.     Right Ear: Tympanic membrane, ear canal and external ear normal.     Left Ear: Tympanic membrane, ear canal and external ear normal.     Nose: Nose normal.  Eyes:     Extraocular Movements: Extraocular movements intact.     Pupils: Pupils are equal, round, and reactive to light.  Cardiovascular:     Rate and Rhythm: Normal rate and regular rhythm.     Heart sounds: Normal heart sounds. No murmur heard.    No gallop.  Pulmonary:     Effort: Pulmonary effort is normal. No respiratory distress.     Breath sounds: Normal breath sounds. No wheezing or rales.  Musculoskeletal:     Right lower leg: 3+ Edema present.     Left lower leg: 3+ Edema present.  Skin:    General: Skin is warm and dry.  Neurological:     General: No focal deficit present.     Mental Status: He is alert and oriented to person, place, and time.  Psychiatric:        Mood and Affect: Mood normal.        Behavior: Behavior normal.     BP 113/65 (BP Location: Left Arm, Patient Position: Sitting, Cuff Size: Large)   Pulse 73   Temp 97.6 F (36.4 C) (Oral)   Resp 16   SpO2 100%  Wt Readings from Last 3 Encounters:  09/26/22 288 lb (130.6 kg)  09/04/22 (!) 305 lb (138.3 kg)  08/08/22 288 lb 8 oz (130.9 kg)     Assessment & Plan:   Problem List Items Addressed This Visit       Unprioritized   Pedal edema    Improved with daily lasix.        OSA (obstructive sleep apnea)    Continues cpap.       Multiple sclerosis (HCC)    Feels like he has had progressive loss of strength.  He is using his left hand more. Continues to follow with Dr Leilani Merl and is maintained on Tysabri.        Hypothyroidism    Lab Results  Component Value Date   TSH 0.51 09/26/2022  On synthroid.  Clinically stable.       Relevant Orders   TSH    Hypotestosteronism    Declines rx.  Clinically stable.       Hyperlipidemia    Lab Results  Component Value Date   CHOL 133 05/20/2022   HDL 44.90 05/20/2022   LDLCALC 73 05/20/2022   TRIG 76.0 05/20/2022   CHOLHDL 3 05/20/2022  On atorvastatin.  Last lipids at goal.       Relevant Medications   furosemide (LASIX) 20 MG tablet   Other Relevant Orders   Lipid panel   Hyperglycemia    Lab Results  Component Value Date   HGBA1C 5.9 09/26/2022  Diet is fair.  He is cutting down on sugar.  Relevant Orders   HgB A1c   HTN (hypertension)    BP Readings from Last 3 Encounters:  01/13/23 113/65  09/26/22 117/60  09/04/22 120/73  At goal continue Benicar HCT.       Relevant Medications   furosemide (LASIX) 20 MG tablet   Other Relevant Orders   Comp Met (CMET)   GERD (gastroesophageal reflux disease) - Primary    Uncontrolled. Prilosec not covered.  Will send rx for pantoprazole.  No H2B's on formulary- he will try to substitute otc pecid.       Relevant Medications   pantoprazole (PROTONIX) 40 MG tablet   Depression with anxiety    Stable on buspar TID, cymbalta. Uses klonopin 3-4 times a day which is helpful.       Chronic constipation    Trial of daily miralax, titrate to one soft bm/day      Benign prostatic hyperplasia    Stable on flomax.          Meds ordered this encounter  Medications   furosemide (LASIX) 20 MG tablet    Sig: Take 1 tablet (20 mg total) by mouth daily. 1 ta po daily x 5 days and then as needed for SOB, weight gain>3#, pedal edema and can take a second tab daily as needed    Dispense:  90 tablet    Refill:  1   pantoprazole (PROTONIX) 40 MG tablet    Sig: Take 1 tablet (40 mg total) by mouth daily.    Dispense:  90 tablet    Refill:  1    Order Specific Question:   Supervising Provider    Answer:   [name] A [[id number]]    I, Lemont Fillers, NP, personally preformed the services described in this documentation.  All  medical record entries made by the scribe were at my direction and in my presence.  I have reviewed the chart and discharge instructions (if applicable) and agree that the record reflects my personal performance and is accurate and complete. 01/13/2023.  I,Mathew Stumpf,acting as a Neurosurgeon for [employer], NP.,have documented all relevant documentation on the behalf of Lemont Fillers, NP,as directed by  Lemont Fillers, NP while in the presence of Lemont Fillers, NP.   Lemont Fillers, NP

## 2023-01-13 NOTE — Assessment & Plan Note (Signed)
Lab Results  Component Value Date   TSH 0.51 09/26/2022   On synthroid.  Clinically stable.

## 2023-01-13 NOTE — Assessment & Plan Note (Signed)
Declines rx.  Clinically stable.

## 2023-01-13 NOTE — Assessment & Plan Note (Signed)
Lab Results  Component Value Date   CHOL 133 05/20/2022   HDL 44.90 05/20/2022   LDLCALC 73 05/20/2022   TRIG 76.0 05/20/2022   CHOLHDL 3 05/20/2022   On atorvastatin.  Last lipids at goal.

## 2023-01-13 NOTE — Assessment & Plan Note (Signed)
Continues cpap.  

## 2023-01-13 NOTE — Assessment & Plan Note (Signed)
Lab Results  Component Value Date   HGBA1C 5.9 09/26/2022   Diet is fair.  He is cutting down on sugar.

## 2023-01-13 NOTE — Assessment & Plan Note (Signed)
Stable on flomax.   

## 2023-01-13 NOTE — Assessment & Plan Note (Signed)
Uncontrolled. Prilosec not covered.  Will send rx for pantoprazole.  No H2B's on formulary- he will try to substitute otc pecid.

## 2023-01-13 NOTE — Assessment & Plan Note (Signed)
Feels like he has had progressive loss of strength.  He is using his left hand more. Continues to follow with Dr Kerman Passey and is maintained on Tysabri.

## 2023-01-13 NOTE — Assessment & Plan Note (Signed)
Stable on buspar TID, cymbalta. Uses klonopin 3-4 times a day which is helpful.

## 2023-01-13 NOTE — Assessment & Plan Note (Signed)
Trial of daily miralax, titrate to one soft bm/day

## 2023-01-13 NOTE — Assessment & Plan Note (Signed)
Improved with daily lasix.

## 2023-01-14 ENCOUNTER — Other Ambulatory Visit: Payer: Self-pay | Admitting: Family

## 2023-01-14 DIAGNOSIS — E039 Hypothyroidism, unspecified: Secondary | ICD-10-CM

## 2023-01-14 MED ORDER — LEVOTHYROXINE SODIUM 137 MCG PO TABS: 137.0000 ug | ORAL_TABLET | Freq: Every day | ORAL | 1 refills | Status: DC

## 2023-01-15 ENCOUNTER — Ambulatory Visit: Payer: 59 | Admitting: Psychology

## 2023-01-29 ENCOUNTER — Ambulatory Visit (INDEPENDENT_AMBULATORY_CARE_PROVIDER_SITE_OTHER): Payer: 59 | Admitting: Psychology

## 2023-01-29 ENCOUNTER — Other Ambulatory Visit: Payer: Self-pay | Admitting: Neurology

## 2023-01-29 DIAGNOSIS — F331 Major depressive disorder, recurrent, moderate: Secondary | ICD-10-CM | POA: Diagnosis not present

## 2023-01-29 NOTE — Progress Notes (Signed)
[facility] Counselor/Therapist Progress Note  Patient ID: MADISON FEIGHTNER, MRN: [id number],    Date: 01/29/2023  Time Spent: 11:00am-11:50am   50 minutes   Treatment Type: Individual Therapy  Reported Symptoms: stress  Mental Status Exam: Appearance:  Casual     Behavior: Appropriate  Motor: Normal  Speech/Language:  Normal Rate  Affect: Appropriate  Mood: normal  Thought process: normal  Thought content:   WNL  Sensory/Perceptual disturbances:   WNL  Orientation: oriented to person, place, time/date, and situation  Attention: Good  Concentration: Good  Memory: WNL  Fund of knowledge:  Good  Insight:   Good  Judgment:  Good  Impulse Control: Good   Risk Assessment: Danger to Self:  No Self-injurious Behavior: No Danger to Others: No Duty to Warn:no Physical Aggression / Violence:No  Access to Firearms a concern: No  Gang Involvement:No   Subjective:  Pt present for face-to-face individual therapy via video Webex.  Pt consents to telehealth video session due to COVID 19 pandemic. Location of pt: home Location of therapist: home office.   Pt talked about having a date night with his wife.  They saw a show and went to dinner.  They had a good time together.   Pt has felt bitter toward [name] bc she comes home from work and goes to bed.   Then there is not talking time for them and pt misses that. Addressed how pt can effectively communicate his feelings to [place]. Pt talked about work.  He needs to travel some but can't travel alone due to his MS.  It is challenging to work out having someone travel with him.  Pt also gets discouraged that he can not travel alone. Pt states he often feels overloaded at work bc he has trouble letting go of projects and transitioning them to colleagues.   Worked on self care strategies.  Provided supportive therapy.   Interventions: Cognitive Behavioral Therapy and Insight-Oriented  Diagnosis: F33.1  Plan of Care: Recommend  ongoing therapy.   Plan to meet every two weeks.   Pt is progressing toward goals.    Treatment Plan  (Treatment Plan Target Date: 03/27/2023) Client Abilities/Strengths  Pt is bright, engaging, and motivated for therapy.   Client Treatment Preferences  Individual therapy.  Client Statement of Needs  Improve coping skills.  Symptoms  Depressed or irritable mood. Feelings of hopelessness, worthlessness, or inappropriate guilt. Low self-esteem.  Problems Addressed  Unipolar Depression Goals 1. Alleviate depressive symptoms and return to previous level of effective functioning. 2. Appropriately grieve the loss in order to normalize mood and to return to previously adaptive level of functioning. Objective Learn and implement behavioral strategies to overcome depression. Target Date: 2023-03-27 Frequency: Biweekly  Progress: 50 Modality: individual  Related Interventions Engage the client in "behavioral activation," increasing his/her activity level and contact with sources of reward, while identifying processes that inhibit activation.  Use behavioral techniques such as instruction, rehearsal, role-playing, role reversal, as needed, to facilitate activity in the client's daily life; reinforce success. Assist the client in developing skills that increase the likelihood of deriving pleasure from behavioral activation (e.g., assertiveness skills, developing an exercise plan, less internal/more external focus, increased social involvement); reinforce success. Objective Identify important people in life, past and present, and describe the quality, good and poor, of those relationships. Target Date: 2023-03-27 Frequency: Biweekly  Progress: 50 Modality: individual  Related Interventions Conduct Interpersonal Therapy beginning with the assessment of the client's "interpersonal inventory" of important past  and present relationships; develop a case formulation linking depression to grief,  interpersonal role disputes, role transitions, and/or interpersonal deficits). Objective Learn and implement problem-solving and decision-making skills. Target Date: 2023-03-27 Frequency: Biweekly  Progress: 50 Modality: individual  Related Interventions Conduct Problem-Solving Therapy using techniques such as psychoeducation, modeling, and role-playing to teach client problem-solving skills (i.e., defining a problem specifically, generating possible solutions, evaluating the pros and cons of each solution, selecting and implementing a plan of action, evaluating the efficacy of the plan, accepting or revising the plan); role-play application of the problem-solving skill to a real life issue. Encourage in the client the development of a positive problem orientation in which problems and solving them are viewed as a natural part of life and not something to be feared, despaired, or avoided. 3. Develop healthy interpersonal relationships that lead to the alleviation and help prevent the relapse of depression. 4. Develop healthy thinking patterns and beliefs about self, others, and the world that lead to the alleviation and help prevent the relapse of depression. 5. Recognize, accept, and cope with feelings of depression. Diagnosis F33.1  Conditions For Discharge Achievement of treatment goals and objectives   Clint Bolder, LCSW

## 2023-01-30 MED ORDER — METHYLPHENIDATE HCL 10 MG PO TABS: 10.0000 mg | ORAL_TABLET | Freq: Four times a day (QID) | ORAL | 0 refills | Status: DC

## 2023-02-03 ENCOUNTER — Encounter: Payer: Self-pay | Admitting: Neurology

## 2023-02-04 ENCOUNTER — Other Ambulatory Visit: Payer: Self-pay | Admitting: *Deleted

## 2023-02-04 ENCOUNTER — Encounter: Payer: Self-pay | Admitting: Neurology

## 2023-02-04 MED ORDER — DALFAMPRIDINE ER 10 MG PO TB12: 10.0000 mg | ORAL_TABLET | Freq: Two times a day (BID) | ORAL | 5 refills | Status: DC

## 2023-02-06 ENCOUNTER — Telehealth: Payer: Self-pay | Admitting: Neurology

## 2023-02-06 NOTE — Telephone Encounter (Signed)
[facility]) Checking on the order for [place]. Make sure order has been received and signed by neurologist and sent back to [place].

## 2023-02-09 NOTE — Telephone Encounter (Signed)
Faxed signed order back to [facility] at [phone]. Received fax confirmation.

## 2023-02-09 NOTE — Telephone Encounter (Signed)
Please call back. Dr. Felecia Shelling was out part of last week. Orders are pending his signature today. We will fax back once he signs.

## 2023-02-12 ENCOUNTER — Ambulatory Visit: Payer: 59 | Admitting: Psychology

## 2023-02-12 DIAGNOSIS — F331 Major depressive disorder, recurrent, moderate: Secondary | ICD-10-CM

## 2023-02-16 ENCOUNTER — Encounter: Payer: Self-pay | Admitting: *Deleted

## 2023-02-16 ENCOUNTER — Encounter: Payer: Self-pay | Admitting: Neurology

## 2023-02-17 ENCOUNTER — Other Ambulatory Visit: Payer: Self-pay | Admitting: *Deleted

## 2023-02-17 MED ORDER — SUCRALFATE 1 G PO TABS: 1.0000 g | ORAL_TABLET | Freq: Four times a day (QID) | ORAL | 1 refills | Status: DC

## 2023-02-23 NOTE — Telephone Encounter (Signed)
Emailed letter to pt as requested.

## 2023-02-27 ENCOUNTER — Ambulatory Visit (INDEPENDENT_AMBULATORY_CARE_PROVIDER_SITE_OTHER): Payer: 59 | Admitting: Psychology

## 2023-02-27 DIAGNOSIS — F331 Major depressive disorder, recurrent, moderate: Secondary | ICD-10-CM

## 2023-02-27 NOTE — Progress Notes (Signed)
[facility] Counselor/Therapist Progress Note  Patient ID: Keith Morrow, MRN: [id number],    Date: 02/27/2023  Time Spent: 11:00am-11:50am   50 minutes   Treatment Type: Individual Therapy  Reported Symptoms: stress  Mental Status Exam: Appearance:  Casual     Behavior: Appropriate  Motor: Normal  Speech/Language:  Normal Rate  Affect: Appropriate  Mood: normal  Thought process: normal  Thought content:   WNL  Sensory/Perceptual disturbances:   WNL  Orientation: oriented to person, place, time/date, and situation  Attention: Good  Concentration: Good  Memory: WNL  Fund of knowledge:  Good  Insight:   Good  Judgment:  Good  Impulse Control: Good   Risk Assessment: Danger to Self:  No Self-injurious Behavior: No Danger to Others: No Duty to Warn:no Physical Aggression / Violence:No  Access to Firearms a concern: No  Gang Involvement:No   Subjective:  Pt present for face-to-face individual therapy via video Webex.  Pt consents to telehealth video session due to COVID 19 pandemic. Location of pt: home Location of therapist: home office.   Pt talked about having some ups and downs the past month.   Pt had his 52th birthday and it was difficult bc he was not having a good MS day on his birthday.   Pt felt badly about his illness and all the struggles he has.  Pt was not feeling valued and was crying.  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  Addressed how pt can ask for what he needs when he has those low moments.  Worked with pt on communicating using "I" statements instead of "you" statements.   Worked on self care strategies.  Provided supportive therapy.   Interventions: Cognitive Behavioral Therapy and Insight-Oriented  Diagnosis: F33.1  Plan of Care: Recommend ongoing therapy.   Plan to meet every two weeks.   Pt is progressing toward goals.    Treatment Plan  (Treatment Plan Target  Date: 03/27/2023) Client Abilities/Strengths  Pt is bright, engaging, and motivated for therapy.   Client Treatment Preferences  Individual therapy.  Client Statement of Needs  Improve coping skills.  Symptoms  Depressed or irritable mood. Feelings of hopelessness, worthlessness, or inappropriate guilt. Low self-esteem.  Problems Addressed  Unipolar Depression Goals 1. Alleviate depressive symptoms and return to previous level of effective functioning. 2. Appropriately grieve the loss in order to normalize mood and to return to previously adaptive level of functioning. Objective Learn and implement behavioral strategies to overcome depression. Target Date: 2023-03-27 Frequency: Biweekly  Progress: 50 Modality: individual  Related Interventions Engage the client in "behavioral activation," increasing his/her activity level and contact with sources of reward, while identifying processes that inhibit activation.  Use behavioral techniques such as instruction, rehearsal, role-playing, role reversal, as needed, to facilitate activity in the client's daily life; reinforce success. Assist the client in developing skills that increase the likelihood of deriving pleasure from behavioral activation (e.g., assertiveness skills, developing an exercise plan, less internal/more external focus, increased social involvement); reinforce success. Objective Identify important people in life, past and present, and describe the quality, good and poor, of those relationships. Target Date: 2023-03-27 Frequency: Biweekly  Progress: 50 Modality: individual  Related Interventions Conduct Interpersonal Therapy beginning with the assessment of the client's "interpersonal inventory" of important past and present relationships; develop a case formulation linking depression to grief, interpersonal role disputes, role transitions, and/or interpersonal deficits). Objective Learn and implement problem-solving and  decision-making skills. Target Date: 2023-03-27 Frequency: Biweekly  Progress: 50 Modality: individual  Related Interventions Conduct Problem-Solving Therapy using techniques such as psychoeducation, modeling, and role-playing to teach client problem-solving skills (i.e., defining a problem specifically, generating possible solutions, evaluating the pros and cons of each solution, selecting and implementing a plan of action, evaluating the efficacy of the plan, accepting or revising the plan); role-play application of the problem-solving skill to a real life issue. Encourage in the client the development of a positive problem orientation in which problems and solving them are viewed as a natural part of life and not something to be feared, despaired, or avoided. 3. Develop healthy interpersonal relationships that lead to the alleviation and help prevent the relapse of depression. 4. Develop healthy thinking patterns and beliefs about self, others, and the world that lead to the alleviation and help prevent the relapse of depression. 5. Recognize, accept, and cope with feelings of depression. Diagnosis F33.1  Conditions For Discharge Achievement of treatment goals and objectives   Clint Bolder, LCSW

## 2023-03-12 ENCOUNTER — Ambulatory Visit (INDEPENDENT_AMBULATORY_CARE_PROVIDER_SITE_OTHER): Payer: Managed Care, Other (non HMO) | Admitting: Neurology

## 2023-03-12 ENCOUNTER — Encounter: Payer: Self-pay | Admitting: Neurology

## 2023-03-12 ENCOUNTER — Telehealth: Payer: Self-pay | Admitting: *Deleted

## 2023-03-12 VITALS — BP 119/71 | HR 75 | Ht 75.0 in | Wt 308.5 lb

## 2023-03-12 DIAGNOSIS — G801 Spastic diplegic cerebral palsy: Secondary | ICD-10-CM | POA: Diagnosis not present

## 2023-03-12 DIAGNOSIS — Z79899 Other long term (current) drug therapy: Secondary | ICD-10-CM

## 2023-03-12 DIAGNOSIS — G35 Multiple sclerosis: Secondary | ICD-10-CM

## 2023-03-12 MED ORDER — METHYLPHENIDATE HCL 10 MG PO TABS: 10.0000 mg | ORAL_TABLET | Freq: Four times a day (QID) | ORAL | 0 refills | Status: DC

## 2023-03-12 MED ORDER — CLONAZEPAM 1 MG PO TABS: 1.0000 mg | ORAL_TABLET | Freq: Four times a day (QID) | ORAL | 1 refills | Status: DC

## 2023-03-12 NOTE — Progress Notes (Signed)
[name]     HPI     Follow-up    Additional comments: Pt in room 11, here for MS follow up. Pt said no new symptoms, pt is in electric wheelchair. Pt fell twice last week.        Last edited by Thamas Jaegers, RMA on 03/12/2023  3:59 PM.       Chief Complaint   Follow-up     HISTORY:  Keith Morrow is a 52 y.o. man with a relapsing form of multiple sclerosis who has right greater than left leg weakness and progressive difficulties with gait function.  Due to severe spasticity, a baclofen pump was placed in 2013 and replaced 09/2020.  Update 03/12/2023: He is on Tysabri and tolerates it well.   No exacerbations.  He has a baclofen pump.  He is now using [facility] to fill the pump.    He cannot use a walker now.   He has had a couple recent falls and transferring is more difficult.   He did not hit his head.  With one he missed the hand hold and fell and the other one his foot slid out while transferring.  He spends most of the day in the chair.    He tries to exercise some with squats in the morning holding on for support.    He has hand controls on his Lucianne Lei.  He is more hesitant to go further away.     There is slow progression of weakness and reduced coordination in the right arm/hand.  He now writes with the left hand and just uses th right hand to operate a mouse.  He drops items.   Sensation is generally the same.     Vision is fine.   Bladder function is fine on tamsulosin.  No UTI.      He gets some back and flank pain while driving.    The bottom of his feet hurt more as the day goes on.     Ritalin helps the MS related ADD/cgnitive/fatigue issues.    He sleeps well at night.  He sleeps better with CPAP.  We discussed different types of masks.  Mood is doing reasonably well.    He has a baclofen pump (2021) .  He now does [place] and was just refilled.    He has patient controlled boluses that kick in in about 20 minutes.    Because he is feeling weaker, he is trying  to use few boluses.  Night time spasticity is doing ok.  He takes a bolus usually in middle of night if he wakes up with some leg twitching.    He also takes clonazepam at night as well   MS history:  In 1997, he had left optic neuritis and then had numbness in both feet. MRI was consistent with MS and a lumbar puncture was also performed and CSF was consistent with MS. Initially, he was placed on Copaxone. He had difficulties with compliance and 5 years later switched to Avonex. Unfortunate, he had difficulties tolerating Avonex and also had difficulty tolerating Rebif. In 2004, he started Tysabri but was only able to take 2 doses before was taken off the market. He restarted in 2005 and has been on Tysabri since then with the exception of a 6 month holiday. Tolerates Tysabri well. He has not had any definite exacerbations though his gait has worsened over the past few years. He is JCV antibody negative.   He  had recent MRI of the brain and spine and there were no changes.      Spasticity history: He was experiencing progressive spasticity in both legs.  He got a baclofen pump in 2013.  Prior to the pump he was having severe tonic spasticity as well as frequent phasic spasms.  These improved.  Due to the spasticity, baclofen was increased in 2016 and 2017 but he became weaker.  I began to see him again and we added clonidine to the baclofen and cut his dose and added nighttime boluses.  He did better with those settings.  Imaging: MRI of the cervical spine dated 07/25/2020 and  MRI of the cervical spine from 07/05/2015.  Both showed multiple T2 hyperintense foci within the spinal cord.  They are located posterolaterally to the right below the cervicomedullary junction, anteriorly adjacent to C3C4,  posterolaterally to the left adjacent to C4, posterior adjacent to C4-C5, both the left and the right adjacent to C4-C5, very large right focus adjacent to C5, posterior laterally to the left adjacent to C6,  towards the right adjacent to C6 and posterior laterally to the right adjacent to C7-T1.  None of the foci enhance.  There did not appear to be any new lesions between 2016 and 2021.   There is a right disc protrusion causing mild to moderate right foraminal narrowing.  At C5-C6, there is a right disc protrusion but no nerve root compression  The MRI of the brain from 07/25/2020 is unchanged compared to the 12/19/2015 MRI according to the official report.  Most foci are supratentorial but somewhat noted in the pons.    There are no enhancing lesions.   PHYSICAL EXAM  General: The patient is well-developed and well-nourished and in no acute distress.  Extremities:   There are no rashes or edema.    Neurologic Exam  Mental status: The patient is alert and oriented x 3 at the time of the examination.  Good focus/attention.    Speech is normal.  CN:   Extraocular muscles are normal.  No ptosis.    Symmetric facial strength and sensation.    PE/TP midline  Motor:  Muscle bulk is normal.  tone is increased in the right arm and minimally increased in the legs.  Strength is 4+/5 in the right arm, 5/5 in the left arm, 2-2+/5 in the right leg and 2+-3/5 in the left leg .   Reduced RAM in right hand  Sensation: He had symmetric sensation to touch and vibration today.  Gait and station: He is wheelchair-bound  Reflexes: Deep tendon reflexes are trace at the left knee and trace at the right knee.   There is no ankle clonus.  ASSESSMENT  Multiple sclerosis - Plan: Stratify JCV Antibody Test ([facility]), CBC with Differential/Platelet  Spastic diplegia  High risk medication use - Plan: Stratify JCV Antibody Test ([facility]), CBC with Differential/Platelet   PLAN: 1.  He will continue with the intrathecal pump.  This will be refilled with [facility]: 1800 mcg/mL baclofen and 420 mcg/mL clonidine.   2.   Continue Tysabri every 4 weeks.   Check JCV antibody and CBC today.  He has been JCV negative. 3.    Continue other med's   .  Refills will be sent in. 4.   Return in 6 months or sooner if there are new or worsening neurologic issues.  We can make further adjustments if needed before the next refill    Victorine Mcnee A. Felecia Shelling, MD,  PhD, FAAN Certified in Neurology, Clinical Neurophysiology, Sleep Medicine, Pain Medicine and Neuroimaging Director, [facility] [address] [place], [place] [address] [phone]

## 2023-03-12 NOTE — Telephone Encounter (Signed)
Placed JCV lab in [facility] lock box for routine lab pick up. Results pending. 

## 2023-03-13 ENCOUNTER — Encounter: Payer: Self-pay | Admitting: Neurology

## 2023-03-13 ENCOUNTER — Ambulatory Visit (INDEPENDENT_AMBULATORY_CARE_PROVIDER_SITE_OTHER): Payer: 59 | Admitting: Psychology

## 2023-03-13 DIAGNOSIS — F331 Major depressive disorder, recurrent, moderate: Secondary | ICD-10-CM | POA: Diagnosis not present

## 2023-03-13 LAB — CBC WITH DIFFERENTIAL/PLATELET
Basophils Absolute: 0.1 10*3/uL (ref 0.0–0.2)
Basos: 1 %
EOS (ABSOLUTE): 0.8 10*3/uL — ABNORMAL HIGH (ref 0.0–0.4)
Eos: 10 %
Hematocrit: 41.1 % (ref 37.5–51.0)
Hemoglobin: 13.9 g/dL (ref 13.0–17.7)
Immature Grans (Abs): 0.1 10*3/uL (ref 0.0–0.1)
Immature Granulocytes: 1 %
Lymphocytes Absolute: 3.1 10*3/uL (ref 0.7–3.1)
Lymphs: 36 %
MCH: 29.4 pg (ref 26.6–33.0)
MCHC: 33.8 g/dL (ref 31.5–35.7)
MCV: 87 fL (ref 79–97)
Monocytes Absolute: 0.6 10*3/uL (ref 0.1–0.9)
Monocytes: 7 %
Neutrophils Absolute: 3.9 10*3/uL (ref 1.4–7.0)
Neutrophils: 45 %
Platelets: 235 10*3/uL (ref 150–450)
RBC: 4.73 x10E6/uL (ref 4.14–5.80)
RDW: 13.5 % (ref 11.6–15.4)
WBC: 8.6 10*3/uL (ref 3.4–10.8)

## 2023-03-13 NOTE — Progress Notes (Signed)
[facility] Counselor/Therapist Progress Note  Patient ID: Keith Morrow, MRN: [id number],    Date: 03/13/2023  Time Spent: 12:00pm-12:50pm   50 minutes   Treatment Type: Individual Therapy  Reported Symptoms: stress  Mental Status Exam: Appearance:  Casual     Behavior: Appropriate  Motor: Normal  Speech/Language:  Normal Rate  Affect: Appropriate  Mood: normal  Thought process: normal  Thought content:   WNL  Sensory/Perceptual disturbances:   WNL  Orientation: oriented to person, place, time/date, and situation  Attention: Good  Concentration: Good  Memory: WNL  Fund of knowledge:  Good  Insight:   Good  Judgment:  Good  Impulse Control: Good   Risk Assessment: Danger to Self:  No Self-injurious Behavior: No Danger to Others: No Duty to Warn:no Physical Aggression / Violence:No  Access to Firearms a concern: No  Gang Involvement:No   Subjective:  Pt present for face-to-face individual therapy via video Webex.  Pt consents to telehealth video session due to COVID 19 pandemic. Location of pt: home Location of therapist: home office.   Pt talked about work.  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   Addressed the issues and helped pt process his feelings.  Pt talked about falling earlier this week.  Pt's son helped him get up and he was not hurt when he fell.  Pt had an appointment with [name] yesterday and he did not need any medication adjustments.  Pt has not had new exacerbations.  Worked on self care strategies.  Provided supportive therapy.   Interventions: Cognitive Behavioral Therapy and Insight-Oriented  Diagnosis: F33.1  Plan of Care: Recommend ongoing therapy.   Plan to meet every two weeks.   Pt is progressing toward goals.    Treatment Plan  (Treatment Plan Target Date: 03/27/2023) Client Abilities/Strengths  Pt is bright, engaging, and motivated for therapy.   Client Treatment Preferences  Individual therapy.  Client  Statement of Needs  Improve coping skills.  Symptoms  Depressed or irritable mood. Feelings of hopelessness, worthlessness, or inappropriate guilt. Low self-esteem.  Problems Addressed  Unipolar Depression Goals 1. Alleviate depressive symptoms and return to previous level of effective functioning. 2. Appropriately grieve the loss in order to normalize mood and to return to previously adaptive level of functioning. Objective Learn and implement behavioral strategies to overcome depression. Target Date: 2023-03-27 Frequency: Biweekly  Progress: 50 Modality: individual  Related Interventions Engage the client in "behavioral activation," increasing his/her activity level and contact with sources of reward, while identifying processes that inhibit activation.  Use behavioral techniques such as instruction, rehearsal, role-playing, role reversal, as needed, to facilitate activity in the client's daily life; reinforce success. Assist the client in developing skills that increase the likelihood of deriving pleasure from behavioral activation (e.g., assertiveness skills, developing an exercise plan, less internal/more external focus, increased social involvement); reinforce success. Objective Identify important people in life, past and present, and describe the quality, good and poor, of those relationships. Target Date: 2023-03-27 Frequency: Biweekly  Progress: 50 Modality: individual  Related Interventions Conduct Interpersonal Therapy beginning with the assessment of the client's "interpersonal inventory" of important past and present relationships; develop a case formulation linking depression to grief, interpersonal role disputes, role transitions, and/or interpersonal deficits). Objective Learn and implement problem-solving and decision-making skills. Target Date: 2023-03-27 Frequency: Biweekly  Progress: 50 Modality: individual  Related Interventions Conduct Problem-Solving Therapy using  techniques such as psychoeducation, modeling, and role-playing to teach client problem-solving skills (i.e., defining  a problem specifically, generating possible solutions, evaluating the pros and cons of each solution, selecting and implementing a plan of action, evaluating the efficacy of the plan, accepting or revising the plan); role-play application of the problem-solving skill to a real life issue. Encourage in the client the development of a positive problem orientation in which problems and solving them are viewed as a natural part of life and not something to be feared, despaired, or avoided. 3. Develop healthy interpersonal relationships that lead to the alleviation and help prevent the relapse of depression. 4. Develop healthy thinking patterns and beliefs about self, others, and the world that lead to the alleviation and help prevent the relapse of depression. 5. Recognize, accept, and cope with feelings of depression. Diagnosis F33.1  Conditions For Discharge Achievement of treatment goals and objectives   Salomon Fick, LCSW

## 2023-03-18 NOTE — Telephone Encounter (Signed)
JCV ab drawn on 03/12/23 indeterminate, index: 0.23. Inhibition assay: negative.

## 2023-03-26 ENCOUNTER — Ambulatory Visit: Payer: 59 | Admitting: Psychology

## 2023-03-27 ENCOUNTER — Other Ambulatory Visit: Payer: Self-pay | Admitting: Family Medicine

## 2023-03-30 ENCOUNTER — Ambulatory Visit (HOSPITAL_BASED_OUTPATIENT_CLINIC_OR_DEPARTMENT_OTHER): Payer: Managed Care, Other (non HMO) | Admitting: Pulmonary Disease

## 2023-03-30 ENCOUNTER — Encounter (HOSPITAL_BASED_OUTPATIENT_CLINIC_OR_DEPARTMENT_OTHER): Payer: Self-pay | Admitting: Pulmonary Disease

## 2023-03-30 VITALS — BP 96/58 | HR 75 | Temp 97.7°F | Ht 75.0 in | Wt 310.0 lb

## 2023-03-30 DIAGNOSIS — G4733 Obstructive sleep apnea (adult) (pediatric): Secondary | ICD-10-CM

## 2023-03-30 NOTE — Progress Notes (Signed)
[facility]  Chief Complaint  Patient presents with   Follow-up    Sleep apnea    Past Surgical History:  He  has a past surgical history that includes right wrist surgery; Vasectomy; Vascular surgery; Wisdom tooth extraction; Coronary angioplasty with stent; Pain pump implantation (N/A, 08/29/2014); Programable baclofen pump revision (08/29/14); and Pain pump implantation (Right, 09/20/2020).  Past Medical History:  Allergies, CAD s/p DES, Depression, Anxiety, HTN, HLD, Hypothyroidism, Low T, Multiple sclerosis   Constitutional:  BP (!) 96/58 (BP Location: Left Arm, Patient Position: Sitting, Cuff Size: Normal)   Pulse 75   Temp 97.7 F (36.5 C) (Oral)   Ht  (1.905 m)   Wt (!) 310 lb (140.6 kg)   SpO2 96%   BMI 38.75 kg/m   Brief Summary:  Keith Morrow is a 52 y.o. male with obstructive sleep apnea.      Subjective:   Uses CPAP nightly.  No issues with pressure setting.  His mask lining wears out and then he gets leak.  If he changes lining every two weeks, then not a problem.  Uses nasal cushion mask.    Not having cough, wheeze, or chest congestion.    Uses ritalin to help with MS induced fatigue.  Physical Exam:   Appearance - well kempt, in motor scooter  ENMT - no sinus tenderness, no oral exudate, no LAN, Mallampati 3 airway, no stridor  Respiratory - equal breath sounds bilaterally, no wheezing or rales  CV - s1s2 regular rate and rhythm, no murmurs  Ext - no clubbing, no edema  Skin - no rashes  Psych - normal mood and affect   Sleep Tests:  HST 09/14/19 >> AHI 28.2, SpO2 low 78%. Auto CPAP 02/25/23 to 03/26/23 >> used on 30 of 30 nights with average 9 hrs 56 min.  Average AHI 7.7 with median CPAP 9 and 95 th percentile CPAP 11 cm H2O  Cardiac Tests:  Echo 01/11/20 >> EF 60 to 65%  Social History:  He  reports that he has never smoked. He has never used smokeless tobacco. He reports current alcohol  use. He reports that he does not use drugs.  Family History:  His family history includes Coronary artery disease in an other family member; Heart attack (age of onset: 32) in his mother; Heart disease in his father, maternal grandfather, maternal grandmother, and mother; Hyperlipidemia in his maternal grandfather and maternal grandmother; Hypertension in his maternal grandfather and maternal grandmother; Stroke in his paternal grandfather; Thyroid disease in his brother, mother, sister, and sister.     Assessment/Plan:   Obstructive sleep apnea. - he is compliant with CPAP and reports benefit from therapy - uses [facility] for his DME - current CPAP ordered October 2020 - continue auto CPAP 8 to 17 cm H2O  Multiple sclerosis. - followed by Dr. Despina Arias with neurology  Time Spent Involved in Patient Care on Day of Examination:  25 minutes  Follow up:   Patient Instructions  Follow up in 1 year  Medication List:   Allergies as of 03/30/2023   No Known Allergies      Medication List        Accurate as of March 30, 2023  2:50 PM. If you have any questions, ask your nurse or doctor.          aspirin EC 81 MG tablet Take 81 mg by mouth daily.   atorvastatin 40 MG tablet Commonly  known as: LIPITOR Take 1 tablet (40 mg total) by mouth daily at 6 PM.   BACLOFEN IT 1 Dose by Intrathecal route as directed.   busPIRone 10 MG tablet Commonly known as: BUSPAR TAKE 1 TABLET THREE TIMES A DAY   cetirizine 10 MG tablet Commonly known as: ZYRTEC Take 10 mg by mouth daily as needed for allergies.   clonazePAM 1 MG tablet Commonly known as: KLONOPIN Take 1 tablet (1 mg total) by mouth 4 (four) times daily.   Clonidine HCl Powd by Does not apply route. /ml IT Baclofen. Total dose 157. /day. Last pump refill 06/02/16   clotrimazole-betamethasone cream Commonly known as: LOTRISONE Apply 1 application topically 2 (two) times daily as needed (anti-fungal).    cyclobenzaprine 10 MG tablet Commonly known as: FLEXERIL TAKE 1 TABLET BY MOUTH THREE TIMES A DAY AS NEEDED FOR MUSCLE SPASMS   Cymbalta 60 MG capsule Generic drug: DULoxetine TAKE 1 CAPSULE TWICE A DAY   dalfampridine 10 MG Tb12 Take 1 tablet (10 mg total) by mouth every 12 (twelve) hours.   famotidine 40 MG tablet Commonly known as: PEPCID TAKE 1 TABLET DAILY AT BEDTIME   furosemide 20 MG tablet Commonly known as: LASIX Take 1 tablet (20 mg total) by mouth daily. 1 ta po daily x 5 days and then as needed for SOB, weight gain>3#, pedal edema and can take a second tab daily as needed   gabapentin 600 MG tablet Commonly known as: NEURONTIN Take 1 tablet (600 mg total) by mouth 4 (four) times daily.   ibuprofen 200 MG tablet Commonly known as: ADVIL Take 200 mg by mouth every 6 (six) hours as needed for moderate pain.   ketoconazole 2 % shampoo Commonly known as: NIZORAL Apply topically 2 (two) times a week.   levothyroxine 137 MCG tablet Commonly known as: SYNTHROID Take 1 tablet (137 mcg total) by mouth daily before breakfast.   methylphenidate 10 MG tablet Commonly known as: RITALIN Take 1 tablet (10 mg total) by mouth 4 (four) times daily.   mupirocin ointment 2 % Commonly known as: BACTROBAN Apply 1 Application topically 2 (two) times daily.   natalizumab 300 MG/15ML injection Commonly known as: TYSABRI Inject 300 mg into the vein every 28 (twenty-eight) days.   nitroGLYCERIN 0.4 MG SL tablet Commonly known as: NITROSTAT PLACE 1 TABLET UNDER THE TOUNGE EVERY 5 MINUTES AS NEEDED FOR CHEST PAIN (X 3 DOSES)   nystatin cream Commonly known as: MYCOSTATIN Apply 1 application  topically as directed.   olmesartan-hydrochlorothiazide 20-12.5 MG tablet Commonly known as: BENICAR HCT TAKE 1 TABLET DAILY   pantoprazole 40 MG tablet Commonly known as: PROTONIX Take 1 tablet (40 mg total) by mouth daily.   potassium chloride SA 20 MEQ tablet Commonly known as:  Klor-Con M20 TAKE 1 TABLET BY MOUTH EVERY DAY AS NEEDED WHEN TAKING A SECOND LASIX TAB ANY GIVEN DAY   sucralfate 1 GM/10ML suspension Commonly known as: Carafate Take 10 mLs (1 g total) by mouth 4 (four) times daily -  with meals and at bedtime. Take liquid or tablets but not both   sucralfate 1 g tablet Commonly known as: CARAFATE Take 1 tablet (1 g total) by mouth 4 (four) times daily.   tamsulosin 0.4 MG Caps capsule Commonly known as: FLOMAX TAKE 1 CAPSULE DAILY        Signature:  Coralyn Helling, MD [facility] Pager - [phone] 03/30/2023, 2:50 PM

## 2023-03-30 NOTE — Patient Instructions (Signed)
Follow up in 1 year.

## 2023-04-09 ENCOUNTER — Ambulatory Visit (INDEPENDENT_AMBULATORY_CARE_PROVIDER_SITE_OTHER): Payer: 59 | Admitting: Psychology

## 2023-04-09 DIAGNOSIS — F331 Major depressive disorder, recurrent, moderate: Secondary | ICD-10-CM | POA: Diagnosis not present

## 2023-04-09 NOTE — Progress Notes (Signed)
[facility] Behavioral Health Counselor Initial Adult Exam  Name: Keith Morrow Date: 04/09/2023 MRN: [id number] DOB: 1971-10-13 PCP: [name], MD  Time spent: 11:00am-11:50am   50 minutes  Guardian/Payee:  Donnamarie Poag requested: No   Reason for Visit /Presenting Problem:  Pt present for face-to-face initial assessment update via video.  Pt consents to telehealth video session due to COVID 19 pandemic. Location of pt: home Location of therapist: home office.  Pt continues to need support dealing with his MS and improving coping skills.   Pt also has stress at work and at times there are challenging family dynamics at home.   Reviewed pt's treatment plan for annual update.  Updated pt's treatment plan and IA.   Pt participated in setting treatment goals.   Plan to continue to meet every two weeks.    Mental Status Exam: Appearance:   Casual     Behavior:  Appropriate  Motor:  Normal  Speech/Language:   Normal Rate  Affect:  Appropriate  Mood:  normal  Thought process:  normal  Thought content:    WNL  Sensory/Perceptual disturbances:    WNL  Orientation:  oriented to person, place, time/date, and situation  Attention:  Good  Concentration:  Good  Memory:  WNL  Fund of knowledge:   Good  Insight:    Good  Judgment:   Good  Impulse Control:  Good   Reported Symptoms:  frustration, stress  Risk Assessment: Danger to Self:  No Self-injurious Behavior: No Danger to Others: No Duty to Warn:no Physical Aggression / Violence:No  Access to Firearms a concern: No  Gang Involvement:No  Patient / guardian was educated about steps to take if suicide or homicide risk level increases between visits: n/a While future psychiatric events cannot be accurately predicted, the patient does not currently require acute inpatient psychiatric care and does not currently meet [facility] involuntary commitment criteria.  Substance Abuse History: Current substance abuse: No      Past Psychiatric History:   Previous psychological history is significant for depression Outpatient Providers:Pt has been in therapy in the past. History of Psych Hospitalization: No  Psychological Testing:  n/a    Abuse History:  Victim of: No.,  n/a    Report needed: No. Victim of Neglect:No. Perpetrator of  n/a   Witness / Exposure to Domestic Violence: No   Protective Services Involvement: No  Witness to [employer] Violence:  No   Family History:  Family History  Problem Relation Age of Onset   Heart attack Mother 37   Thyroid disease Mother    Heart disease Mother    Thyroid disease Sister    Thyroid disease Brother    Heart disease Maternal Grandmother    Hypertension Maternal Grandmother    Hyperlipidemia Maternal Grandmother    Heart disease Maternal Grandfather    Hypertension Maternal Grandfather    Hyperlipidemia Maternal Grandfather    Stroke Paternal Grandfather    Thyroid disease Sister    Heart disease Father        2 stents   Coronary artery disease Other        questionable in father    Living situation: the patient lives with his family  Pt does not have support from his extended family. Pt did not feel supported in childhood.  Negative messages from parents impact pt's self esteem.   Family history of depression.  Sexual Orientation: Straight  Relationship Status: married  Name of spouse /  other:[name] If a parent, number of children / ages:3 adult children  Support Systems: spouse  Financial Stress:  No   Income/Employment/Disability: Employment Pt is an Art gallery manager.  Military Service: No   Educational History: Education: Risk manager: Protestant  Any cultural differences that may affect / interfere with treatment:  not applicable   Recreation/Hobbies: reading  Stressors: Health problems   Marital or family conflict    Strengths: Supportive Relationships, Journalist, newspaper, and Able to Communicate  Effectively  Barriers:  none   Legal History: Pending legal issue / charges: The patient has no significant history of legal issues. History of legal issue / charges:  n/a  Medical History/Surgical History: reviewed Past Medical History:  Diagnosis Date   Acute bronchitis 05/09/2013   Allergy    Bronchitis, acute 12/10/2011   Candidal skin infection 05/05/2017   Chicken pox as a child   Constipation 02/20/2017   Coronary atherosclerosis of native coronary artery    a. NSTEMI 04/20/11: tx with Promus DES to LAD; bifurcation lesion with oDx 90% tx with POBA; b. staged PCI of right post. AV Branch with Promus DES; c. 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   Visual changes 11/13/2016    Past Surgical History:  Procedure Laterality Date   CORONARY ANGIOPLASTY WITH STENT PLACEMENT     2012   PAIN PUMP IMPLANTATION N/A 08/29/2014   Procedure: Baclofen pump placement;  Surgeon: Maeola Harman, MD;  Location: [facility];  Service: Neurosurgery;  Laterality: N/A;  Baclofen pump placement   PAIN PUMP IMPLANTATION Right 09/20/2020   Procedure: Right Baclofen pump replacement;  Surgeon: Maeola Harman, MD;  Location: [facility];  Service: Neurosurgery;  Laterality: Right;  Right Baclofen pump replacement   PROGRAMABLE BACLOFEN PUMP REVISION  08/29/14   right wrist surgery     CTR   VASCULAR SURGERY     vericose vein in right femoral area    VASECTOMY     WISDOM TOOTH EXTRACTION      Medications: Current Outpatient Medications  Medication Sig Dispense Refill   aspirin EC 81 MG tablet Take 81 mg by mouth daily.     atorvastatin (LIPITOR) 40 MG tablet Take 1 tablet (40 mg total) by mouth daily at 6 PM. 90 tablet 3   BACLOFEN IT 1 Dose by Intrathecal route as directed.      busPIRone (BUSPAR) 10 MG tablet TAKE 1 TABLET THREE TIMES A DAY 270 tablet 3   cetirizine (ZYRTEC) 10 MG tablet Take 10 mg by mouth daily as needed for allergies.     clonazePAM (KLONOPIN) 1 MG tablet Take 1 tablet (1 mg total) by mouth 4 (four) times daily. 360 tablet 1   Clonidine HCl POWD by Does not apply route. 720mg /ml IT Baclofen. Total dose 157.89mg /day. Last pump refill 06/02/16  clotrimazole-betamethasone (LOTRISONE) cream Apply 1 application topically 2 (two) times daily as needed (anti-fungal). 30 g 2   cyclobenzaprine (FLEXERIL) 10 MG tablet TAKE 1 TABLET BY MOUTH THREE TIMES A DAY AS NEEDED FOR MUSCLE SPASMS 90 tablet 11   CYMBALTA 60 MG capsule TAKE 1 CAPSULE TWICE A DAY 180 capsule 3   dalfampridine 10 MG TB12 Take 1 tablet (10 mg total) by mouth every 12 (twelve) hours. 60 tablet 5   famotidine (PEPCID) 40 MG tablet TAKE 1 TABLET DAILY AT BEDTIME 90 tablet 3   furosemide (LASIX) 20 MG tablet Take 1 tablet (20 mg total) by mouth daily. 1 ta po daily x 5 days and then as needed for SOB, weight gain>3#, pedal edema and can take a second tab daily as needed 90 tablet 1   gabapentin (NEURONTIN) 600 MG tablet Take 1 tablet (600 mg total) by mouth 4 (four) times daily. 360 tablet 1   ibuprofen (ADVIL,MOTRIN) 200 MG tablet Take 200 mg by mouth every 6 (six) hours as needed for moderate pain.     ketoconazole (NIZORAL) 2 % shampoo Apply topically 2 (two) times a week. 120 mL 5   levothyroxine (SYNTHROID) 137 MCG tablet Take 1 tablet (137 mcg total) by mouth daily before breakfast. 90 tablet 1   methylphenidate (RITALIN) 10 MG tablet Take 1 tablet (10  mg total) by mouth 4 (four) times daily. 120 tablet 0   mupirocin ointment (BACTROBAN) 2 % Apply 1 Application topically 2 (two) times daily. 22 g 0   natalizumab (TYSABRI) 300 MG/15ML injection Inject 300 mg into the vein every 28 (twenty-eight) days.      nitroGLYCERIN (NITROSTAT) 0.4 MG SL tablet PLACE 1 TABLET UNDER THE TOUNGE EVERY 5 MINUTES AS NEEDED FOR CHEST PAIN (X 3 DOSES) 25 tablet 0   nystatin cream (MYCOSTATIN) Apply 1 application  topically as directed. 30 g 2   olmesartan-hydrochlorothiazide (BENICAR HCT) 20-12.5 MG tablet TAKE 1 TABLET DAILY 90 tablet 3   pantoprazole (PROTONIX) 40 MG tablet Take 1 tablet (40 mg total) by mouth daily. 90 tablet 1   potassium chloride SA (KLOR-CON M20) 20 MEQ tablet TAKE 1 TABLET BY MOUTH EVERY DAY AS NEEDED WHEN TAKING A SECOND LASIX TAB ANY GIVEN DAY 90 tablet 3   sucralfate (CARAFATE) 1 g tablet Take 1 tablet (1 g total) by mouth 4 (four) times daily. 360 tablet 1   sucralfate (CARAFATE) 1 GM/10ML suspension Take 10 mLs (1 g total) by mouth 4 (four) times daily -  with meals and at bedtime. Take liquid or tablets but not both 420 mL 3   tamsulosin (FLOMAX) 0.4 MG CAPS capsule TAKE 1 CAPSULE DAILY 90 capsule 3   No current facility-administered medications for this visit.    No Known Allergies  Diagnoses:  F33.1  Plan of Care: Recommend ongoing therapy.   Plan to meet every two weeks.   Pt is progressing toward goals.    Treatment Plan  (Treatment Plan Target Date: 04/08/2024) Client Abilities/Strengths  Pt is bright, engaging, and motivated for therapy.   Client Treatment Preferences  Individual therapy.  Client Statement of Needs  Improve coping skills.  Symptoms  Depressed or irritable mood. Feelings of hopelessness, worthlessness, or inappropriate guilt. Low self-esteem.  Problems Addressed  Unipolar Depression Goals 1. Alleviate depressive symptoms and return to previous level of effective functioning. 2. Appropriately  grieve the loss in order to normalize mood and to return to previously adaptive level of functioning. Objective Learn  and implement behavioral strategies to overcome depression. Target Date: 2024-04-08 Frequency: Biweekly  Progress: 60 Modality: individual  Related Interventions Engage the client in "behavioral activation," increasing his/her activity level and contact with sources of reward, while identifying processes that inhibit activation.  Use behavioral techniques such as instruction, rehearsal, role-playing, role reversal, as needed, to facilitate activity in the client's daily life; reinforce success. Assist the client in developing skills that increase the likelihood of deriving pleasure from behavioral activation (e.g., assertiveness skills, developing an exercise plan, less internal/more external focus, increased social involvement); reinforce success. Objective Identify important people in life, past and present, and describe the quality, good and poor, of those relationships. Target Date: 2024-04-08 Frequency: Biweekly  Progress: 60 Modality: individual  Related Interventions Conduct Interpersonal Therapy beginning with the assessment of the client's "interpersonal inventory" of important past and present relationships; develop a case formulation linking depression to grief, interpersonal role disputes, role transitions, and/or interpersonal deficits). Objective Learn and implement problem-solving and decision-making skills. Target Date: 2024-04-08 Frequency: Biweekly  Progress: 60 Modality: individual  Related Interventions Conduct Problem-Solving Therapy using techniques such as psychoeducation, modeling, and role-playing to teach client problem-solving skills (i.e., defining a problem specifically, generating possible solutions, evaluating the pros and cons of each solution, selecting and implementing a plan of action, evaluating the efficacy of the plan, accepting or revising the  plan); role-play application of the problem-solving skill to a real life issue. Encourage in the client the development of a positive problem orientation in which problems and solving them are viewed as a natural part of life and not something to be feared, despaired, or avoided. 3. Develop healthy interpersonal relationships that lead to the alleviation and help prevent the relapse of depression. 4. Develop healthy thinking patterns and beliefs about self, others, and the world that lead to the alleviation and help prevent the relapse of depression. 5. Recognize, accept, and cope with feelings of depression. Diagnosis F33.1  Conditions For Discharge Achievement of treatment goals and objectives    Salomon Fick, LCSW

## 2023-04-11 NOTE — Assessment & Plan Note (Signed)
Encourage heart healthy diet such as MIND or DASH diet, increase exercise, avoid trans fats, simple carbohydrates and processed foods, consider a krill or fish or flaxseed oil cap daily. Tolerating Atorvastatin 

## 2023-04-11 NOTE — Assessment & Plan Note (Signed)
Works with [facility], on CPAP

## 2023-04-11 NOTE — Assessment & Plan Note (Signed)
On Levothyroxine, continue to monitor 

## 2023-04-11 NOTE — Assessment & Plan Note (Signed)
Well controlled, no changes to meds. Encouraged heart healthy diet such as the DASH diet and exercise as tolerated.  °

## 2023-04-11 NOTE — Assessment & Plan Note (Signed)
Follows with Dr Leilani Merl of neurology. Essentially wheelchair bound. Lives at home with wife and kids

## 2023-04-11 NOTE — Assessment & Plan Note (Signed)
hgba1c acceptable, minimize simple carbs. Increase exercise as tolerated.  

## 2023-04-13 ENCOUNTER — Ambulatory Visit (INDEPENDENT_AMBULATORY_CARE_PROVIDER_SITE_OTHER): Payer: Managed Care, Other (non HMO) | Admitting: Family Medicine

## 2023-04-13 ENCOUNTER — Encounter: Payer: Self-pay | Admitting: Family Medicine

## 2023-04-13 VITALS — BP 100/64 | HR 56 | Temp 97.6°F | Resp 16 | Ht 75.0 in | Wt 313.4 lb

## 2023-04-13 DIAGNOSIS — R739 Hyperglycemia, unspecified: Secondary | ICD-10-CM

## 2023-04-13 DIAGNOSIS — E038 Other specified hypothyroidism: Secondary | ICD-10-CM

## 2023-04-13 DIAGNOSIS — F418 Other specified anxiety disorders: Secondary | ICD-10-CM

## 2023-04-13 DIAGNOSIS — L89891 Pressure ulcer of other site, stage 1: Secondary | ICD-10-CM

## 2023-04-13 DIAGNOSIS — K219 Gastro-esophageal reflux disease without esophagitis: Secondary | ICD-10-CM

## 2023-04-13 DIAGNOSIS — I10 Essential (primary) hypertension: Secondary | ICD-10-CM

## 2023-04-13 DIAGNOSIS — G35 Multiple sclerosis: Secondary | ICD-10-CM

## 2023-04-13 DIAGNOSIS — G4733 Obstructive sleep apnea (adult) (pediatric): Secondary | ICD-10-CM

## 2023-04-13 DIAGNOSIS — E782 Mixed hyperlipidemia: Secondary | ICD-10-CM

## 2023-04-13 MED ORDER — BUSPIRONE HCL 10 MG PO TABS: 10.0000 mg | ORAL_TABLET | Freq: Four times a day (QID) | ORAL | 3 refills | Status: DC

## 2023-04-13 NOTE — Assessment & Plan Note (Signed)
Lateral aspect of left small toe. Stage 1 with no skin breakdown. Encouraged to try corn pads to protect and let us know if does not resolve and we can refer for further managment

## 2023-04-13 NOTE — Assessment & Plan Note (Signed)
Still under a great deal of stress with the worst stress at work but he believes it will get better after this month. Doing well most days on current meds, he reports he is easier to live with on Buspar refill given.

## 2023-04-13 NOTE — Patient Instructions (Signed)
[facility] lift for transferring discuss with physical therapy and let us know if you want to try and qualify

## 2023-04-13 NOTE — Progress Notes (Signed)
Subjective:   By signing my name below, I, Keith Morrow, attest that this documentation has been prepared under the direction and in the presence of Keith Canary, MD. 04/13/2023   Patient ID: Keith Morrow, male    DOB: January 05, 1971, 52 y.o.   MRN: [id number]  Chief Complaint  Patient presents with   Follow-up    Follow up    HPI Patient is in today for a follow-up appointment.   Acute  He denies having any recent illnesses, fevers, constipation, or other GI issues.   Falls  He has had three falls for the last three weeks. Two of the falls occurred during a transfer from his wheelchair  to the bed. It took two people to transfer him due to weakened leg strength.   Physical Therapy  His physical therapy is no longer covered by his insurance. He repeats that the strength in his legs have weakened since. His strength has dropped from 30 to 20 reps of exercises since stopping physical therapy.   Left shoulder pain He complains of pain in his left shoulder. This is the primary arm he uses to pull up.   Right flank pain He complains of right flank pain. He takes gabapentin 600 mg 4x daily PO to manage it.  Left foot wound He complains of a wound on his left pinky toe that appeared a month ago. It appears worse when wearing shoes.   Stress His stress levels have been elevated recently due to work. It is more of a struggle when he has to go in person for work due to being wheel chair bound. He is taking Buspar 4x daily PO to manage his mood.   Past Medical History:  Diagnosis Date   Acute bronchitis 05/09/2013   Allergy    Bronchitis, acute 12/10/2011   Candidal skin infection 05/05/2017   Chicken pox as a child   Constipation 02/20/2017   Coronary atherosclerosis of native coronary artery    a. NSTEMI 04/20/11: tx with Promus DES to LAD; bifurcation lesion with oDx 90% tx with POBA; b. staged PCI of right post. AV Branch with Promus DES; c. 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   Visual changes 11/13/2016    Past Surgical History:  Procedure Laterality Date   CORONARY ANGIOPLASTY WITH STENT PLACEMENT     2012   PAIN PUMP IMPLANTATION N/A 08/29/2014   Procedure: Baclofen pump placement;  Surgeon: Maeola Harman, MD;  Location: [facility];  Service: Neurosurgery;  Laterality: N/A;  Baclofen pump placement   PAIN PUMP IMPLANTATION Right 09/20/2020   Procedure: Right Baclofen pump replacement;  Surgeon: Maeola Harman, MD;  Location: [facility] OR;  Service: Neurosurgery;  Laterality: Right;  Right Baclofen pump replacement   PROGRAMABLE BACLOFEN PUMP REVISION  08/29/14   right  wrist surgery     CTR   VASCULAR SURGERY     vericose vein in right femoral area   VASECTOMY     WISDOM TOOTH EXTRACTION      Family History  Problem Relation Age of Onset   Heart attack Mother 49   Thyroid disease Mother    Heart disease Mother    Thyroid disease Sister    Thyroid disease Brother    Heart disease Maternal Grandmother    Hypertension Maternal Grandmother    Hyperlipidemia Maternal Grandmother    Heart disease Maternal Grandfather    Hypertension Maternal Grandfather    Hyperlipidemia Maternal Grandfather    Stroke Paternal Grandfather    Thyroid disease Sister    Heart disease Father        2 stents   Coronary artery disease Other        questionable in father    Social History   Socioeconomic History    Marital status: Married    Spouse name: Not on file   Number of children: 3   Years of education: Not on file   Highest education level: Bachelor's degree (e.g., BA, AB, BS)  Occupational History   Not on file  Tobacco Use   Smoking status: Never   Smokeless tobacco: Never  Vaping Use   Vaping Use: Never used  Substance and Sexual Activity   Alcohol use: Yes    Comment: socially   Drug use: No   Sexual activity: Yes    Partners: Female    Comment: 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, works Engineer, maintenance, no dietary restrictions  Other Topics Concern   Not on file  Social History Narrative   Not on file   Social Determinants of Health   Financial Resource Strain: Low Risk  (04/12/2023)   Overall Financial Resource Strain (CARDIA)    Difficulty of Paying Living Expenses: Not hard at all  Food Insecurity: No Food Insecurity (04/12/2023)   Hunger Vital Sign    Worried About Running Out of Food in the Last Year: Never true    Ran Out of Food in the Last Year: Never true  Transportation Needs: No Transportation Needs (04/12/2023)   PRAPARE - Administrator, Civil Service (Medical): No    Lack of Transportation (Non-Medical): No  Physical Activity: Unknown (04/12/2023)   Exercise Vital Sign    Days of Exercise per Week: 0 days    Minutes of Exercise per Session: Not on file  Stress: Stress Concern Present (04/12/2023)   [employer] of Occupational Health - Occupational Stress Questionnaire    Feeling of Stress : Rather much  Social Connections: Moderately Isolated (04/12/2023)   Social Connection and Isolation Panel [NHANES]    Frequency of Communication with Friends and Family: More than three times a week    Frequency of Social Gatherings with Friends and Family: More than three times a week    Attends Religious Services: Never    Database administrator or Organizations: No    Attends Engineer, structural: Not on file    Marital Status: Married  Careers information officer Violence: Not on file    Outpatient Medications Prior to Visit  Medication Sig Dispense Refill   aspirin EC 81 MG tablet Take 81 mg by mouth daily.     atorvastatin (LIPITOR) 40 MG tablet Take 1 tablet (40 mg total) by mouth daily at 6 PM. 90 tablet 3   BACLOFEN IT 1 Dose by  Intrathecal route as directed.      cetirizine (ZYRTEC) 10 MG tablet Take 10 mg by mouth daily as needed for allergies.     clonazePAM (KLONOPIN) 1 MG tablet Take 1 tablet (1 mg total) by mouth 4 (four) times daily. 360 tablet 1   Clonidine HCl POWD by Does not apply route. 720mg /ml IT Baclofen. Total dose 157.89mg /day. Last pump refill 06/02/16     clotrimazole-betamethasone (LOTRISONE) cream Apply 1 application topically 2 (two) times daily as needed (anti-fungal). 30 g 2   cyclobenzaprine (FLEXERIL) 10 MG tablet TAKE 1 TABLET BY MOUTH THREE TIMES A DAY AS NEEDED FOR MUSCLE SPASMS 90 tablet 11   CYMBALTA 60 MG capsule TAKE 1 CAPSULE TWICE A DAY 180 capsule 3   dalfampridine 10 MG TB12 Take 1 tablet (10 mg total) by mouth every 12 (twelve) hours. 60 tablet 5   famotidine (PEPCID) 40 MG tablet TAKE 1 TABLET DAILY AT BEDTIME 90 tablet 3   furosemide (LASIX) 20 MG tablet Take 1 tablet (20 mg total) by mouth daily. 1 ta po daily x 5 days and then as needed for SOB, weight gain>3#, pedal edema and can take a second tab daily as needed 90 tablet 1   gabapentin (NEURONTIN) 600 MG tablet Take 1 tablet (600 mg total) by mouth 4 (four) times daily. 360 tablet 1   ibuprofen (ADVIL,MOTRIN) 200 MG tablet Take 200 mg by mouth every 6 (six) hours as needed for moderate pain.     ketoconazole (NIZORAL) 2 % shampoo Apply topically 2 (two) times a week. 120 mL 5   levothyroxine (SYNTHROID) 137 MCG tablet Take 1 tablet (137 mcg total) by mouth daily before breakfast. 90 tablet 1   methylphenidate (RITALIN) 10 MG tablet Take 1 tablet (10 mg total) by mouth 4 (four) times daily. 120 tablet 0   mupirocin ointment (BACTROBAN) 2 % Apply 1  Application topically 2 (two) times daily. 22 g 0   natalizumab (TYSABRI) 300 MG/15ML injection Inject 300 mg into the vein every 28 (twenty-eight) days.      nitroGLYCERIN (NITROSTAT) 0.4 MG SL tablet PLACE 1 TABLET UNDER THE TOUNGE EVERY 5 MINUTES AS NEEDED FOR CHEST PAIN (X 3 DOSES) 25 tablet 0   nystatin cream (MYCOSTATIN) Apply 1 application  topically as directed. 30 g 2   olmesartan-hydrochlorothiazide (BENICAR HCT) 20-12.5 MG tablet TAKE 1 TABLET DAILY 90 tablet 3   pantoprazole (PROTONIX) 40 MG tablet Take 1 tablet (40 mg total) by mouth daily. 90 tablet 1   potassium chloride SA (KLOR-CON M20) 20 MEQ tablet TAKE 1 TABLET BY MOUTH EVERY DAY AS NEEDED WHEN TAKING A SECOND LASIX TAB ANY GIVEN DAY 90 tablet 3   sucralfate (CARAFATE) 1 g tablet Take 1 tablet (1 g total) by mouth 4 (four) times daily. 360 tablet 1   sucralfate (CARAFATE) 1 GM/10ML suspension Take 10 mLs (1 g total) by mouth 4 (four) times daily -  with meals and at bedtime. Take liquid or tablets but not both 420 mL 3   tamsulosin (FLOMAX) 0.4 MG CAPS capsule TAKE 1 CAPSULE DAILY 90 capsule 3   busPIRone (BUSPAR) 10 MG tablet TAKE 1 TABLET THREE TIMES A DAY 270 tablet 3   No facility-administered medications prior to visit.    No Known Allergies  Review of Systems  Constitutional:  Negative for fever.       (-) recent illnesses  Gastrointestinal:  Negative for constipation.       (-) GI issues  Musculoskeletal:  Positive for falls.       (+) left shoulder pain (+) right flank pain       Objective:    Physical Exam Constitutional:      General: He is not in acute distress.    Appearance: Normal appearance.  HENT:     Head: Normocephalic and atraumatic.     Right Ear: External ear normal.     Left Ear: External ear normal.  Eyes:     Extraocular Movements: Extraocular movements intact.     Pupils: Pupils are equal, round, and reactive to light.  Cardiovascular:     Rate and Rhythm: Normal rate and regular  rhythm.     Heart sounds: Normal heart sounds. No murmur heard.    No gallop.  Pulmonary:     Effort: Pulmonary effort is normal. No respiratory distress.     Breath sounds: Normal breath sounds. No wheezing or rales.  Feet:     Comments: Left foot superficial stage 1 wound Skin:    General: Skin is warm.  Neurological:     Mental Status: He is alert and oriented to person, place, and time.  Psychiatric:        Judgment: Judgment normal.     BP 100/64 (BP Location: Right Arm, Patient Position: Sitting, Cuff Size: Large)   Pulse (!) 56   Temp 97.6 F (36.4 C) (Oral)   Resp 16   Ht 6\' 3"  (1.905 m)   Wt (!) 313 lb 6.4 oz (142.2 kg)   SpO2 98%   BMI 39.17 kg/m  Wt Readings from Last 3 Encounters:  04/13/23 (!) 313 lb 6.4 oz (142.2 kg)  03/30/23 (!) 310 lb (140.6 kg)  03/12/23 (!) 308 lb 8 oz (139.9 kg)       Assessment & Plan:  Primary hypertension Assessment & Plan: Well controlled, no changes to meds. Encouraged heart healthy diet such as the DASH diet and exercise as tolerated.   Orders: -     CBC with Differential/Platelet -     Comprehensive metabolic panel -     TSH  Hyperglycemia Assessment & Plan: hgba1c acceptable, minimize simple carbs. Increase exercise as tolerated.   Orders: -     Hemoglobin A1c -     Hemoglobin A1c -     Comprehensive metabolic panel  Mixed hyperlipidemia Assessment & Plan: Encourage heart healthy diet such as MIND or DASH diet, increase exercise, avoid trans fats, simple carbohydrates and processed foods, consider a krill or fish or flaxseed oil cap daily. Tolerating Atorvastatin  Orders: -     Lipid panel  Other specified hypothyroidism Assessment & Plan: On Levothyroxine, continue to monitor  Orders: -     Lipid panel -     TSH  Multiple sclerosis (HCC) Assessment & Plan: Follows with Dr Leilani Merl of neurology. Essentially wheelchair bound. Lives at home with wife and kids he was in PT but his insurance decided not to any  more but he will likely return next month when he is considered a new case given his risk for falls and progression   OSA (obstructive sleep apnea) Assessment & Plan: Works with [facility], on CPAP   Gastroesophageal reflux disease, unspecified whether esophagitis present -     CBC with Differential/Platelet  Pressure injury of foot, stage 1, unspecified laterality Assessment & Plan: Lateral aspect of left small toe. Stage 1 with no skin breakdown. Encouraged to try corn pads to protect and let us know if  does not resolve and we can refer for further managment   Depression with anxiety Assessment & Plan: Still under a great deal of stress with the worst stress at work but he believes it will get better after this month. Doing well most days on current meds, he reports he is easier to live with on Buspar refill given.    Other orders -     busPIRone HCl; Take 1 tablet (10 mg total) by mouth in the morning, at noon, in the evening, and at bedtime.  Dispense: 360 tablet; Refill: 3    I, [name], MD, personally preformed the services described in this documentation.  All medical record entries made by the scribe were at my direction and in my presence.  I have reviewed the chart and discharge instructions (if applicable) and agree that the record reflects my personal performance and is accurate and complete. 04/13/2023  [name], MD  [facility] as a scribe for [name], MD.,have documented all relevant documentation on the behalf of [name], MD,as directed by  [name], MD while in the presence of [name], MD.

## 2023-04-14 LAB — CBC WITH DIFFERENTIAL/PLATELET
Basophils Absolute: 0.1 10*3/uL (ref 0.0–0.1)
Basophils Relative: 0.8 % (ref 0.0–3.0)
Eosinophils Absolute: 0.5 10*3/uL (ref 0.0–0.7)
Eosinophils Relative: 6.9 % — ABNORMAL HIGH (ref 0.0–5.0)
HCT: 38 % — ABNORMAL LOW (ref 39.0–52.0)
Hemoglobin: 12.7 g/dL — ABNORMAL LOW (ref 13.0–17.0)
Lymphocytes Relative: 30.8 % (ref 12.0–46.0)
Lymphs Abs: 2.3 10*3/uL (ref 0.7–4.0)
MCHC: 33.4 g/dL (ref 30.0–36.0)
MCV: 89.1 fl (ref 78.0–100.0)
Monocytes Absolute: 0.5 10*3/uL (ref 0.1–1.0)
Monocytes Relative: 7.1 % (ref 3.0–12.0)
Neutro Abs: 4.1 10*3/uL (ref 1.4–7.7)
Neutrophils Relative %: 54.4 % (ref 43.0–77.0)
Platelets: 187 10*3/uL (ref 150.0–400.0)
RBC: 4.27 Mil/uL (ref 4.22–5.81)
RDW: 15.1 % (ref 11.5–15.5)
WBC: 7.5 10*3/uL (ref 4.0–10.5)

## 2023-04-14 LAB — LIPID PANEL
Cholesterol: 126 mg/dL (ref 0–200)
HDL: 51.7 mg/dL (ref >39.00)
LDL Cholesterol: 62 mg/dL (ref 0–99)
NonHDL: 74.23
Total CHOL/HDL Ratio: 2
Triglycerides: 61 mg/dL (ref 0.0–149.0)
VLDL: 12.2 mg/dL (ref 0.0–40.0)

## 2023-04-14 LAB — COMPREHENSIVE METABOLIC PANEL
ALT: 22 U/L (ref 0–53)
AST: 16 U/L (ref 0–37)
Albumin: 4 g/dL (ref 3.5–5.2)
Alkaline Phosphatase: 82 U/L (ref 39–117)
BUN: 19 mg/dL (ref 6–23)
CO2: 32 mEq/L (ref 19–32)
Calcium: 8.9 mg/dL (ref 8.4–10.5)
Chloride: 100 mEq/L (ref 96–112)
Creatinine, Ser: 1 mg/dL (ref 0.40–1.50)
GFR: 87.99 mL/min (ref >60.00)
Glucose, Bld: 88 mg/dL (ref 70–99)
Potassium: 4 mEq/L (ref 3.5–5.1)
Sodium: 140 mEq/L (ref 135–145)
Total Bilirubin: 0.5 mg/dL (ref 0.2–1.2)
Total Protein: 6.4 g/dL (ref 6.0–8.3)

## 2023-04-14 LAB — HEMOGLOBIN A1C: Hgb A1c MFr Bld: 5.9 % (ref 4.6–6.5)

## 2023-04-14 LAB — TSH: TSH: 0.87 u[IU]/mL (ref 0.35–5.50)

## 2023-04-15 ENCOUNTER — Encounter: Payer: Self-pay | Admitting: Family Medicine

## 2023-04-15 ENCOUNTER — Other Ambulatory Visit: Payer: Managed Care, Other (non HMO)

## 2023-04-15 ENCOUNTER — Other Ambulatory Visit: Payer: Self-pay

## 2023-04-15 DIAGNOSIS — Z1211 Encounter for screening for malignant neoplasm of colon: Secondary | ICD-10-CM

## 2023-04-15 DIAGNOSIS — D649 Anemia, unspecified: Secondary | ICD-10-CM

## 2023-04-15 NOTE — Telephone Encounter (Signed)
Called pt regarding Ifob kit and was advised, we will mailed the kit And will need to bring back to office, Pt stated he understand.

## 2023-04-16 ENCOUNTER — Other Ambulatory Visit: Payer: Self-pay

## 2023-04-16 DIAGNOSIS — D649 Anemia, unspecified: Secondary | ICD-10-CM

## 2023-04-16 LAB — IRON,TIBC AND FERRITIN PANEL
%SAT: 19 % (calc) — ABNORMAL LOW (ref 20–48)
Ferritin: 11 ng/mL — ABNORMAL LOW (ref 38–380)
Iron: 68 ug/dL (ref 50–180)
TIBC: 349 mcg/dL (calc) (ref 250–425)

## 2023-04-16 MED ORDER — HEMOCYTE-PLUS 106-1 MG PO TABS: ORAL_TABLET | ORAL | 3 refills | Status: DC

## 2023-04-20 ENCOUNTER — Other Ambulatory Visit: Payer: Self-pay | Admitting: Cardiovascular Disease

## 2023-04-21 ENCOUNTER — Other Ambulatory Visit: Payer: Self-pay

## 2023-04-21 ENCOUNTER — Emergency Department (HOSPITAL_COMMUNITY)
Admission: EM | Admit: 2023-04-21 | Discharge: 2023-04-21 | Disposition: A | Discharge status: Home / Self Care | Payer: Managed Care, Other (non HMO) | Attending: Emergency Medicine | Admitting: Emergency Medicine

## 2023-04-21 ENCOUNTER — Emergency Department (HOSPITAL_COMMUNITY): Payer: Managed Care, Other (non HMO)

## 2023-04-21 DIAGNOSIS — I10 Essential (primary) hypertension: Secondary | ICD-10-CM | POA: Insufficient documentation

## 2023-04-21 DIAGNOSIS — R079 Chest pain, unspecified: Secondary | ICD-10-CM | POA: Diagnosis present

## 2023-04-21 DIAGNOSIS — R0789 Other chest pain: Secondary | ICD-10-CM

## 2023-04-21 DIAGNOSIS — K449 Diaphragmatic hernia without obstruction or gangrene: Secondary | ICD-10-CM | POA: Insufficient documentation

## 2023-04-21 DIAGNOSIS — Z79899 Other long term (current) drug therapy: Secondary | ICD-10-CM | POA: Diagnosis not present

## 2023-04-21 DIAGNOSIS — I2510 Atherosclerotic heart disease of native coronary artery without angina pectoris: Secondary | ICD-10-CM | POA: Insufficient documentation

## 2023-04-21 DIAGNOSIS — Z7982 Long term (current) use of aspirin: Secondary | ICD-10-CM | POA: Diagnosis not present

## 2023-04-21 DIAGNOSIS — E039 Hypothyroidism, unspecified: Secondary | ICD-10-CM | POA: Insufficient documentation

## 2023-04-21 LAB — CBC
HCT: 40.7 % (ref 39.0–52.0)
Hemoglobin: 12.9 g/dL — ABNORMAL LOW (ref 13.0–17.0)
MCH: 28.5 pg (ref 26.0–34.0)
MCHC: 31.7 g/dL (ref 30.0–36.0)
MCV: 90 fL (ref 80.0–100.0)
Platelets: 197 10*3/uL (ref 150–400)
RBC: 4.52 MIL/uL (ref 4.22–5.81)
RDW: 14.4 % (ref 11.5–15.5)
WBC: 8.4 10*3/uL (ref 4.0–10.5)
nRBC: 0 % (ref 0.0–0.2)

## 2023-04-21 LAB — BASIC METABOLIC PANEL
Anion gap: 10 (ref 5–15)
BUN: 21 mg/dL — ABNORMAL HIGH (ref 6–20)
CO2: 27 mmol/L (ref 22–32)
Calcium: 9.1 mg/dL (ref 8.9–10.3)
Chloride: 101 mmol/L (ref 98–111)
Creatinine, Ser: 1.09 mg/dL (ref 0.61–1.24)
GFR, Estimated: >60 mL/min (ref >60)
Glucose, Bld: 100 mg/dL — ABNORMAL HIGH (ref 70–99)
Potassium: 3.8 mmol/L (ref 3.5–5.1)
Sodium: 138 mmol/L (ref 135–145)

## 2023-04-21 LAB — TROPONIN I (HIGH SENSITIVITY)
Troponin I (High Sensitivity): <2 ng/L (ref <18)
Troponin I (High Sensitivity): <2 ng/L (ref <18)

## 2023-04-21 MED ORDER — IOHEXOL 350 MG/ML SOLN
75.0000 mL | Freq: Once | INTRAVENOUS | Status: AC | PRN
  Administered 2023-04-21: 75 mL via INTRAVENOUS

## 2023-04-21 NOTE — Discharge Instructions (Addendum)
Increase your pantoprazole to twice a day for the next week.

## 2023-04-21 NOTE — ED Triage Notes (Signed)
Pt states that earlier today without exertion he began having midsternal non-radiating CP. Pt states the pain was similar to previous MI 12 years ago when he had two stents placed. Pt took one nitro with relief.

## 2023-04-21 NOTE — ED Notes (Signed)
Patient verbalizes understanding of discharge instructions. Opportunity for questioning and answers were provided. Armband removed by staff, pt discharged from ED.  

## 2023-04-21 NOTE — ED Provider Notes (Signed)
EMERGENCY DEPARTMENT AT [facility] Provider Note   CSN: [id number] Arrival date & time: 04/21/23  1311     History  Chief Complaint  Patient presents with   Chest Pain    [name] is a 52 y.o. male.  Pt is a 52 yo male with pmhx significant for CAD s/p 2 stents, hypothyroidism, htn, hld, ms (wheelchair bound), GERD, depression, and sleep apnea.  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  Pt said he adjusted his wheelchair and took a nitro.  Sx went away.  Sx started around 1100 and lasted about 1/2 hr.  Pt said he feels ok now.  No sob.  He does have swelling in his legs, but this is a chronic thing.       Home Medications Prior to Admission medications   Medication Sig Start Date End Date Taking? Authorizing Provider  aspirin EC 81 MG tablet Take 81 mg by mouth daily.    [provider]  atorvastatin (LIPITOR) 40 MG tablet TAKE 1 TABLET DAILY AT 6 P.M. 04/20/23   Tonny Bollman, MD  BACLOFEN IT 1 Dose by Intrathecal route as directed.     [provider]  busPIRone (BUSPAR) 10 MG tablet Take 1 tablet (10 mg total) by mouth in the morning, at noon, in the evening, and at bedtime. 04/13/23   [name], MD  cetirizine (ZYRTEC) 10 MG tablet Take 10 mg by mouth daily as needed for allergies.    [provider]  clonazePAM (KLONOPIN) 1 MG tablet Take 1 tablet (1 mg total) by mouth 4 (four) times daily. 03/12/23   Sater, Pearletha Furl, MD  Clonidine HCl POWD by Does not apply route. 720mg /ml IT Baclofen. Total dose 157.89mg /day. Last pump refill 06/02/16    [provider]  clotrimazole-betamethasone (LOTRISONE) cream Apply 1 application topically 2 (two) times daily as needed (anti-fungal). 02/20/17   [name], MD  cyclobenzaprine (FLEXERIL) 10 MG tablet TAKE 1 TABLET BY MOUTH THREE TIMES A DAY AS NEEDED FOR MUSCLE SPASMS 03/03/22   Sater, Pearletha Furl, MD  CYMBALTA 60 MG capsule TAKE 1  CAPSULE TWICE A DAY 09/29/22   [name], MD  dalfampridine 10 MG TB12 Take 1 tablet (10 mg total) by mouth every 12 (twelve) hours. 02/04/23   Sater, Pearletha Furl, MD  famotidine (PEPCID) 40 MG tablet TAKE 1 TABLET DAILY AT BEDTIME 03/25/22   [name], MD  Fe Fum-FA-B Cmp-C-Zn-Mg-Mn-Cu (HEMOCYTE-PLUS) 106-1 MG TABS Take time daily. 04/16/23   [name], MD  furosemide (LASIX) 20 MG tablet Take 1 tablet (20 mg total) by mouth daily. 1 ta po daily x 5 days and then as needed for SOB, weight gain>3#, pedal edema and can take a second tab daily as needed 01/13/23   Sandford Craze, NP  gabapentin (NEURONTIN) 600 MG tablet Take 1 tablet (600 mg total) by mouth 4 (four) times daily. 03/24/22   Sater, Pearletha Furl, MD  ibuprofen (ADVIL,MOTRIN) 200 MG tablet Take 200 mg by mouth every 6 (six) hours as needed for moderate pain.    [provider]  ketoconazole (NIZORAL) 2 % shampoo Apply topically 2 (two) times a week. 09/29/22   Sandford Craze, NP  levothyroxine (SYNTHROID) 137 MCG tablet Take 1 tablet (137 mcg total) by mouth daily before breakfast. 01/14/23   Sandford Craze, NP  methylphenidate (RITALIN) 10 MG tablet Take 1 tablet (  10 mg total) by mouth 4 (four) times daily. 03/12/23   Sater, Pearletha Furl, MD  mupirocin ointment (BACTROBAN) 2 % Apply 1 Application topically 2 (two) times daily. 08/08/22   Olive Bass, FNP  natalizumab (TYSABRI) 300 MG/15ML injection Inject 300 mg into the vein every 28 (twenty-eight) days.     [provider]  nitroGLYCERIN (NITROSTAT) 0.4 MG SL tablet PLACE 1 TABLET UNDER THE TOUNGE EVERY 5 MINUTES AS NEEDED FOR CHEST PAIN (X 3 DOSES) 05/18/17   [name], MD  nystatin cream (MYCOSTATIN) Apply 1 application  topically as directed. 05/20/22   [name], MD  olmesartan-hydrochlorothiazide (BENICAR HCT) 20-12.5 MG tablet TAKE 1 TABLET DAILY 02/21/22   [name], MD  pantoprazole (PROTONIX) 40 MG tablet Take 1 tablet (40  mg total) by mouth daily. 01/13/23   Sandford Craze, NP  potassium chloride SA (KLOR-CON M20) 20 MEQ tablet TAKE 1 TABLET BY MOUTH EVERY DAY AS NEEDED WHEN TAKING A SECOND LASIX TAB ANY GIVEN DAY 04/02/21   Dyann Kief, PA-C  sucralfate (CARAFATE) 1 g tablet Take 1 tablet (1 g total) by mouth 4 (four) times daily. 02/17/23   [name], MD  sucralfate (CARAFATE) 1 GM/10ML suspension Take 10 mLs (1 g total) by mouth 4 (four) times daily -  with meals and at bedtime. Take liquid or tablets but not both 10/22/21   [name], MD  tamsulosin (FLOMAX) 0.4 MG CAPS capsule TAKE 1 CAPSULE DAILY 03/27/23   [name], MD      Allergies    Patient has no known allergies.    Review of Systems   Review of Systems  Musculoskeletal:  Positive for back pain.  All other systems reviewed and are negative.   Physical Exam Updated Vital Signs BP 104/60   Pulse (!) 55   Temp (!) 97.5 F (36.4 C)   Resp 11   Ht 6\' 3"  (1.905 m)   Wt (!) 142.9 kg   SpO2 99%   BMI 39.37 kg/m  Physical Exam Vitals and nursing note reviewed.  Constitutional:      Appearance: He is well-developed.  HENT:     Head: Normocephalic and atraumatic.  Eyes:     Extraocular Movements: Extraocular movements intact.     Pupils: Pupils are equal, round, and reactive to light.  Cardiovascular:     Rate and Rhythm: Normal rate and regular rhythm.     Heart sounds: Normal heart sounds.  Pulmonary:     Effort: Pulmonary effort is normal.     Breath sounds: Normal breath sounds.  Abdominal:     General: Bowel sounds are normal.     Palpations: Abdomen is soft.  Musculoskeletal:        General: Normal range of motion.     Cervical back: Normal range of motion and neck supple.     Right lower leg: Edema present.     Left lower leg: Edema present.  Skin:    General: Skin is warm.  Neurological:     General: No focal deficit present.     Mental Status: He is alert and oriented to person, place, and time.   Psychiatric:        Mood and Affect: Mood normal.        Behavior: Behavior normal.     ED Results / Procedures / Treatments   Labs (all labs ordered are listed, but only abnormal results are displayed) Labs Reviewed  BASIC METABOLIC PANEL - Abnormal; Notable for the following components:      Result Value   Glucose, Bld 100 (*)    BUN 21 (*)    All other components within normal limits  CBC - Abnormal; Notable for the following components:   Hemoglobin 12.9 (*)    All other components within normal limits  TROPONIN I (HIGH SENSITIVITY)  TROPONIN I (HIGH SENSITIVITY)    EKG EKG Interpretation  Date/Time:  Tuesday Apr 21 2023 14:22:24 EDT Ventricular Rate:  70 PR Interval:  57 QRS Duration: 103 QT Interval:  400 QTC Calculation: 432 R Axis:   62 Text Interpretation: Sinus rhythm Short PR interval Abnormal R-wave progression, early transition No significant change since last tracing Confirmed by Jacalyn Lefevre [phone]) on 04/21/2023 2:52:24 PM  Radiology CT Angio Chest PE W and/or Wo Contrast  Result Date: 04/21/2023 CLINICAL DATA:  High clinical suspicion for PE EXAM: CT ANGIOGRAPHY CHEST WITH CONTRAST TECHNIQUE: Multidetector CT imaging of the chest was performed using the standard protocol during bolus administration of intravenous contrast. Multiplanar CT image reconstructions and MIPs were obtained to evaluate the vascular anatomy. RADIATION DOSE REDUCTION: This exam was performed according to the departmental dose-optimization program which includes automated exposure control, adjustment of the mA and/or kV according to patient size and/or use of iterative reconstruction technique. CONTRAST:  75mL OMNIPAQUE IOHEXOL 350 MG/ML SOLN COMPARISON:  Previous studies including the chest radiograph done earlier today FINDINGS: Cardiovascular: There is homogeneous enhancement in central pulmonary artery branches. Evaluation of small peripheral branches is limited by motion artifacts  and infiltrates. Extensive coronary artery calcifications are seen. Contrast density in thoracic aorta is less than optimal. As far as seen, there is no demonstrable intimal flap. Mediastinum/Nodes: No significant lymphadenopathy is seen. Lungs/Pleura: Increased markings are seen in both lower lung fields. Minimal bilateral pleural effusions are seen. There is no pneumothorax. Upper Abdomen: Small hiatal hernia is seen. There is frothy density intraluminal thoracic esophagus suggesting gastroesophageal reflux. Musculoskeletal: Unremarkable. Review of the MIP images confirms the above findings. IMPRESSION: There is no evidence of pulmonary artery embolism. Increased markings are seen in the lower lung fields suggesting atelectasis/pneumonia. Minimal bilateral pleural effusions are seen. Small hiatal hernia. Gastroesophageal reflux. Coronary artery disease. Electronically Signed   By: Ernie Avena M.D.   On: 04/21/2023 17:05   DG Chest 2 View  Result Date: 04/21/2023 CLINICAL DATA:  Chest pain EXAM: CHEST - 2 VIEW COMPARISON:  X-ray 12/07/2015 FINDINGS: No consolidation, pneumothorax or effusion. No edema. Normal cardiopericardial silhouette. Degenerative changes are seen along the spine. IMPRESSION: No acute cardiopulmonary disease Electronically Signed   By: Karen Kays M.D.   On: 04/21/2023 14:22    Procedures Procedures    Medications Ordered in ED Medications  iohexol (OMNIPAQUE) 350 MG/ML injection 75 mL (75 mLs Intravenous Contrast Given 04/21/23 1649)    ED Course/ Medical Decision Making/ A&P                             Medical Decision Making Amount and/or Complexity of Data Reviewed Labs: ordered. Radiology: ordered.  Risk Prescription drug management.   This patient presents to the ED for concern of back pain, this involves an extensive number of treatment options, and is a complaint that carries with it a high risk of complications and morbidity.  The differential  diagnosis includes cad, msk, pe   Co morbidities that complicate the patient evaluation  CAD s/p 2 stents, hypothyroidism, htn, hld, ms (wheelchair bound), GERD, depression, and sleep apnea   Additional history obtained:  Additional history obtained from epic chart review External records from outside source obtained and reviewed including family   Lab Tests:  I Ordered, and personally interpreted labs.  The pertinent results include:  cbc nl, bmp nl, trop nl   Imaging Studies ordered:  I ordered imaging studies including cxr and ct chest  I independently visualized and interpreted imaging which showed  CXR:   No acute cardiopulmonary disease  CT chest: There is no evidence of pulmonary artery embolism. Increased  markings are seen in the lower lung fields suggesting  atelectasis/pneumonia. Minimal bilateral pleural effusions are seen.    Small hiatal hernia. Gastroesophageal reflux. Coronary artery  disease.   I agree with the radiologist interpretation   Cardiac Monitoring:  The patient was maintained on a cardiac monitor.  I personally viewed and interpreted the cardiac monitored which showed an underlying rhythm of: nsr   Medicines ordered and prescription drug management:   I have reviewed the patients home medicines and have made adjustments as needed   Test Considered:  ct  Problem List / ED Course:  Back pain:  likely msk.  No evidence of acs equivalent.  He does have a hiatal hernia also, so I am going to have him increase his pantoprazole to bid for the next week to see if that helps.  CT chest neg for PE.  Clinically, he does not have pna, but more likely has atelectasis due to his muscle weakness from MS.  Pt is stable for d/c.  Return if worse.  He has not seen cards in several years, so he's given a referral to see them again.   Reevaluation:  After the interventions noted above, I reevaluated the patient and found that they have  :improved   Social Determinants of Health:  Lives at home   Dispostion:  After consideration of the diagnostic results and the patients response to treatment, I feel that the patent would benefit from discharge with outpatient f/u.          Final Clinical Impression(s) / ED Diagnoses Final diagnoses:  Atypical chest pain  Coronary artery disease involving native coronary artery of native heart without angina pectoris  Hiatal hernia    Rx / DC Orders ED Discharge Orders          Ordered    Ambulatory referral to Cardiology        04/21/23 1723              Jacalyn Lefevre, MD 04/21/23 1731

## 2023-04-23 ENCOUNTER — Ambulatory Visit (INDEPENDENT_AMBULATORY_CARE_PROVIDER_SITE_OTHER): Payer: 59 | Admitting: Psychology

## 2023-04-23 DIAGNOSIS — F331 Major depressive disorder, recurrent, moderate: Secondary | ICD-10-CM | POA: Diagnosis not present

## 2023-04-23 NOTE — Progress Notes (Signed)
[facility] Counselor/Therapist Progress Note  Patient ID: Keith Morrow, MRN: [id number],    Date: 04/23/2023  Time Spent: 11:00am-11:50am  50 minutes   Treatment Type: Individual Therapy  Reported Symptoms: stress, dispair  Mental Status Exam: Appearance:  Casual     Behavior: Appropriate  Motor: Normal  Speech/Language:  Normal Rate  Affect: Appropriate  Mood: normal  Thought process: normal  Thought content:   WNL  Sensory/Perceptual disturbances:   WNL  Orientation: oriented to person, place, time/date, and situation  Attention: Good  Concentration: Good  Memory: WNL  Fund of knowledge:  Good  Insight:   Good  Judgment:  Good  Impulse Control: Good   Risk Assessment: Danger to Self:  No Self-injurious Behavior: No Danger to Others: No Duty to Warn:no Physical Aggression / Violence:No  Access to Firearms a concern: No  Gang Involvement:No   Subjective: Pt present for face-to-face individual therapy via video.  Pt consents to telehealth video session due to COVID 19 pandemic. Location of pt: home Location of therapist: home office.   Pt talked about having a very difficult week last week bc he fell in the shower.   It was very upsetting for pt.  His son Keith Morrow helped pt get up.  He was not hurt.  The falls really affect pt emotionally.   He worries about progressive decline.  He also feels embarrassed to have to have help getting up.  Helped pt process his feelings.   Worked on acceptance issues regarding asking for help.   Pt also had to go to the ER this week for chest pain.  He was assessed and he did not have a heart attack but he has a follow up with his cardiologist.   Pt talked about work.   He has a very busy work schedule upcoming.  He will travel some this summer and [name] will accompany him.  Worked on self care strategies. Provided supportive therapy.  Interventions: Cognitive Behavioral Therapy and Insight-Oriented  Diagnosis:   F33.1  Plan of Care: Recommend ongoing therapy.   Plan to meet every two weeks.   Pt is progressing toward goals.    Treatment Plan  (Treatment Plan Target Date: 04/08/2024) Client Abilities/Strengths  Pt is bright, engaging, and motivated for therapy.   Client Treatment Preferences  Individual therapy.  Client Statement of Needs  Improve coping skills.  Symptoms  Depressed or irritable mood. Feelings of hopelessness, worthlessness, or inappropriate guilt. Low self-esteem.  Problems Addressed  Unipolar Depression Goals 1. Alleviate depressive symptoms and return to previous level of effective functioning. 2. Appropriately grieve the loss in order to normalize mood and to return to previously adaptive level of functioning. Objective Learn and implement behavioral strategies to overcome depression. Target Date: 2024-04-08 Frequency: Biweekly  Progress: 60 Modality: individual  Related Interventions Engage the client in "behavioral activation," increasing his/her activity level and contact with sources of reward, while identifying processes that inhibit activation.  Use behavioral techniques such as instruction, rehearsal, role-playing, role reversal, as needed, to facilitate activity in the client's daily life; reinforce success. Assist the client in developing skills that increase the likelihood of deriving pleasure from behavioral activation (e.g., assertiveness skills, developing an exercise plan, less internal/more external focus, increased social involvement); reinforce success. Objective Identify important people in life, past and present, and describe the quality, good and poor, of those relationships. Target Date: 2024-04-08 Frequency: Biweekly  Progress: 60 Modality: individual  Related Interventions Conduct Interpersonal Therapy beginning  with the assessment of the client's "interpersonal inventory" of important past and present relationships; develop a case formulation  linking depression to grief, interpersonal role disputes, role transitions, and/or interpersonal deficits). Objective Learn and implement problem-solving and decision-making skills. Target Date: 2024-04-08 Frequency: Biweekly  Progress: 60 Modality: individual  Related Interventions Conduct Problem-Solving Therapy using techniques such as psychoeducation, modeling, and role-playing to teach client problem-solving skills (i.e., defining a problem specifically, generating possible solutions, evaluating the pros and cons of each solution, selecting and implementing a plan of action, evaluating the efficacy of the plan, accepting or revising the plan); role-play application of the problem-solving skill to a real life issue. Encourage in the client the development of a positive problem orientation in which problems and solving them are viewed as a natural part of life and not something to be feared, despaired, or avoided. 3. Develop healthy interpersonal relationships that lead to the alleviation and help prevent the relapse of depression. 4. Develop healthy thinking patterns and beliefs about self, others, and the world that lead to the alleviation and help prevent the relapse of depression. 5. Recognize, accept, and cope with feelings of depression. Diagnosis F33.1  Conditions For Discharge Achievement of treatment goals and objectives   Salomon Fick, LCSW

## 2023-04-24 ENCOUNTER — Other Ambulatory Visit: Payer: Self-pay | Admitting: Family Medicine

## 2023-04-28 ENCOUNTER — Ambulatory Visit: Payer: Managed Care, Other (non HMO) | Attending: Cardiovascular Disease | Admitting: Cardiovascular Disease

## 2023-04-28 ENCOUNTER — Encounter: Payer: Self-pay | Admitting: Cardiovascular Disease

## 2023-04-28 VITALS — BP 106/62 | HR 96 | Ht 75.0 in | Wt 310.0 lb

## 2023-04-28 DIAGNOSIS — E782 Mixed hyperlipidemia: Secondary | ICD-10-CM

## 2023-04-28 DIAGNOSIS — I10 Essential (primary) hypertension: Secondary | ICD-10-CM | POA: Diagnosis not present

## 2023-04-28 DIAGNOSIS — I25119 Atherosclerotic heart disease of native coronary artery with unspecified angina pectoris: Secondary | ICD-10-CM | POA: Diagnosis not present

## 2023-04-28 MED ORDER — NITROGLYCERIN 0.4 MG SL SUBL: SUBLINGUAL_TABLET | SUBLINGUAL | 6 refills | Status: DC

## 2023-04-28 NOTE — Progress Notes (Signed)
Cardiology Office Note:    Date:  04/28/2023   ID:  Keith Morrow, DOB 02/15/1971, MRN [id number]  PCP:  [name], MD   [facility] Providers [name], MD     Referring MD: Jacalyn Lefevre, MD   Chief Complaint  Patient presents with   Coronary Artery Disease   Chest Pain    History of Present Illness:    Keith Morrow is a 52 y.o. male with a hx of multiple sclerosis, hypertension, mixed hyperlipidemia, and coronary artery disease, presenting for follow-up evaluation.  The patient initially presented in 2012 with non-STEMI and was treated with stenting of the LAD, angioplasty of diagonal, and staged PCI of the posterior AV segment of the RCA.  LVEF was noted to be preserved.  The patient was recently evaluated in the emergency department for atypical chest pain.  High-sensitivity troponin was negative.  A CT of the chest showed bibasilar atelectasis with no evidence of pulmonary embolus.  EKG showed sinus rhythm with no ischemic ST/T changes.  Patient presents for emergency room follow-up evaluation.  The patient is here alone today.  He was seen in the ER because of chest and back pain.  Some of his symptoms felt similar to his previous heart attack.  However, now in retrospect he tells me that he used a different pillow to support him in his wheelchair.  He thinks this was the culprit.  States that he was leaning forward a lot more than normal.  He has subsequently changed the pillow out and has not had any recurrence of symptoms.  He is had no other chest pain or pressure, dyspnea, or any new problems.  He has chronic lower extremity edema due to his nonambulatory status.  This has been stable and he wears compression stockings.  Past Medical History:  Diagnosis Date   Acute bronchitis 05/09/2013   Allergy    Bronchitis, acute 12/10/2011   Candidal skin infection 05/05/2017   Chicken pox as a child   Constipation 02/20/2017   Coronary  atherosclerosis of native coronary artery    a. NSTEMI 04/20/11: tx with Promus DES to LAD; bifurcation lesion with oDx 90% tx with POBA; b. staged PCI of right post. AV Branch with Promus DES; c. 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   Visual changes 11/13/2016    Past Surgical History:  Procedure Laterality Date   CORONARY ANGIOPLASTY WITH STENT PLACEMENT     2012   PAIN PUMP IMPLANTATION N/A 08/29/2014   Procedure: Baclofen pump placement;  Surgeon: Maeola Harman, MD;  Location: [facility];  Service: Neurosurgery;  Laterality: N/A;  Baclofen pump placement   PAIN PUMP IMPLANTATION Right 09/20/2020   Procedure: Right Baclofen pump replacement;  Surgeon: Maeola Harman, MD;  Location: [facility] OR;  Service:  Neurosurgery;  Laterality: Right;  Right Baclofen pump replacement   PROGRAMABLE BACLOFEN PUMP REVISION  08/29/14   right wrist surgery     CTR   VASCULAR SURGERY     vericose vein in right femoral area   VASECTOMY     WISDOM TOOTH EXTRACTION      Current Medications: Current Meds  Medication Sig   aspirin EC 81 MG tablet Take 81 mg by mouth daily.   atorvastatin (LIPITOR) 40 MG tablet TAKE 1 TABLET DAILY AT 6 P.M.   BACLOFEN IT 1 Dose by Intrathecal route as directed.    busPIRone (BUSPAR) 10 MG tablet Take 1 tablet (10 mg total) by mouth in the morning, at noon, in the evening, and at bedtime.   cetirizine (ZYRTEC) 10  MG tablet Take 10 mg by mouth daily as needed for allergies.   clonazePAM (KLONOPIN) 1 MG tablet Take 1 tablet (1 mg total) by mouth 4 (four) times daily.   Clonidine HCl POWD by Does not apply route. 720mg /ml IT Baclofen. Total dose 157.89mg /day. Last pump refill 06/02/16   clotrimazole-betamethasone (LOTRISONE) cream Apply 1 application topically 2 (two) times daily as needed (anti-fungal).   cyclobenzaprine (FLEXERIL) 10 MG tablet TAKE 1 TABLET BY MOUTH THREE TIMES A DAY AS NEEDED FOR MUSCLE SPASMS   CYMBALTA 60 MG capsule TAKE 1 CAPSULE TWICE A DAY   dalfampridine 10 MG TB12 Take 1 tablet (10 mg total) by mouth every 12 (twelve) hours.   famotidine (PEPCID) 40 MG tablet TAKE 1 TABLET DAILY AT BEDTIME   Fe Fum-FA-B Cmp-C-Zn-Mg-Mn-Cu (HEMOCYTE-PLUS) 106-1 MG TABS Take time daily.   furosemide (LASIX) 20 MG tablet Take 1 tablet (20 mg total) by mouth daily. 1 ta po daily x 5 days and then as needed for SOB, weight gain>3#, pedal edema and can take a second tab daily as needed   gabapentin (NEURONTIN) 600 MG tablet Take 1 tablet (600 mg total) by mouth 4 (four) times daily.   ibuprofen (ADVIL,MOTRIN) 200 MG tablet Take 200 mg by mouth every 6 (six) hours as needed for moderate pain.   ketoconazole (NIZORAL) 2 % shampoo Apply topically 2 (two) times a week.   levothyroxine (SYNTHROID) 137 MCG tablet Take 1 tablet (137 mcg total) by mouth daily before breakfast.   methylphenidate (RITALIN) 10 MG tablet Take 1 tablet (10 mg total) by mouth 4 (four) times daily.   mupirocin ointment (BACTROBAN) 2 % Apply 1 Application topically 2 (two) times daily.   natalizumab (TYSABRI) 300 MG/15ML injection Inject 300 mg into the vein every 28 (twenty-eight) days.    nystatin cream (MYCOSTATIN) Apply 1 application  topically as directed.   olmesartan-hydrochlorothiazide (BENICAR HCT) 20-12.5 MG tablet TAKE 1 TABLET DAILY   pantoprazole (PROTONIX) 40 MG tablet Take 1 tablet (40 mg total) by mouth daily.   potassium  chloride SA (KLOR-CON M20) 20 MEQ tablet TAKE 1 TABLET BY MOUTH EVERY DAY AS NEEDED WHEN TAKING A SECOND LASIX TAB ANY GIVEN DAY   sucralfate (CARAFATE) 1 g tablet Take 1 tablet (1 g total) by mouth 4 (four) times daily.   sucralfate (CARAFATE) 1 GM/10ML suspension Take 10 mLs (1 g total) by mouth 4 (four) times daily -  with meals and at bedtime. Take liquid or tablets but not both   tamsulosin (FLOMAX) 0.4 MG CAPS capsule TAKE 1 CAPSULE DAILY   [DISCONTINUED] nitroGLYCERIN (NITROSTAT) 0.4 MG SL tablet PLACE 1 TABLET UNDER THE TOUNGE EVERY 5 MINUTES AS NEEDED FOR CHEST PAIN (X  3 DOSES)     Allergies:   Patient has no known allergies.   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, works Engineer, maintenance, no dietary restrictions  Other Topics Concern   Not on file  Social History Narrative   Not on file   Social Determinants of Health   Financial Resource Strain: Low Risk  (04/12/2023)   Overall Financial Resource Strain (CARDIA)    Difficulty of Paying Living Expenses: Not hard at all  Food Insecurity: No Food Insecurity (04/12/2023)   Hunger Vital Sign    Worried About Running Out of Food in the Last Year: Never true    Ran Out of Food in the Last Year: Never true  Transportation Needs: No Transportation Needs (04/12/2023)   PRAPARE - Administrator, Civil Service (Medical): No    Lack of Transportation (Non-Medical): No  Physical Activity: Unknown (04/12/2023)   Exercise Vital Sign    Days  of Exercise per Week: 0 days    Minutes of Exercise per Session: Not on file  Stress: Stress Concern Present (04/12/2023)   [employer] of Occupational Health - Occupational Stress Questionnaire    Feeling of Stress : Rather much  Social Connections: Moderately Isolated (04/12/2023)   Social Connection and Isolation Panel [NHANES]    Frequency of Communication with Friends and Family: More than three times a week    Frequency of Social Gatherings with Friends and Family: More than three times a week    Attends Religious Services: Never    Database administrator or Organizations: No    Attends Engineer, structural: Not on file    Marital Status: Married     Family History: The patient's family history includes Coronary artery disease in an other family member; Heart attack (age of onset: 52) in his mother; Heart disease in his father, maternal grandfather, maternal grandmother, and mother; Hyperlipidemia in his maternal grandfather and maternal grandmother; Hypertension in his maternal grandfather and maternal grandmother; Stroke in his paternal grandfather; Thyroid disease in his brother, mother, sister, and sister.  ROS:   Please see the history of present illness.    All other systems reviewed and are negative.  EKGs/Labs/Other Studies Reviewed:    The following studies were reviewed today: Cardiac Studies & Procedures     STRESS TESTS  MYOCARDIAL PERFUSION IMAGING 12/20/2015  Narrative  Nuclear stress EF: 59%. Akinesis in the basal and mid inferior walls and paradoxical septal motion.  There was no ST segment deviation noted during stress.  Defect 1: There is a medium defect of moderate severity present in the basal inferior, mid inferior and apical inferior location.  The study is normal.  Findings consistent with prior myocardial infarction with peri-infarct ischemia.  This is an intermediate risk study.  The left ventricular ejection fraction is mildly  decreased (45-54%).  There is a  scar in the basal and mid inferior walls with very mild ischemia in the mid and apical inferior walls (SDS 2).   ECHOCARDIOGRAM  ECHOCARDIOGRAM COMPLETE 01/11/2020  Narrative ECHOCARDIOGRAM REPORT    Patient Name:   Pressley REZA GRZESIK Date of Exam: 01/11/2020 Medical Rec #:  [id number]      Height:       76.0 in Accession #:    [id number]     Weight:       299.4 lb Date of Birth:  02-03-1971      BSA:          2.63 m Patient Age:    48 years       BP:           142/79 mmHg Patient Gender: M              HR:           72 bpm. Exam Location:  [place]  Procedure: 2D Echo, Cardiac Doppler and Color Doppler  Indications:    Dyspnea  History:        Patient has no prior history of Echocardiogram examinations. CAD and Previous Myocardial Infarction, Signs/Symptoms:Dyspnea; Risk Factors:Hypertension and Dyslipidemia.  Sonographer:    Sinda Du RDCS (AE) Referring Phys: 37 STACEY A BLYTH  IMPRESSIONS   1. Left ventricular ejection fraction, by visual estimation, is 60 to 65%. The left ventricle has normal function. There is no left ventricular hypertrophy. 2. The left ventricle has no regional wall motion abnormalities. 3. Global right ventricle has normal systolic function.The right ventricular size is normal. No increase in right ventricular wall thickness. 4. Left atrial size was normal. 5. Right atrial size was normal. 6. The mitral valve is normal in structure. Trivial mitral valve regurgitation. No evidence of mitral stenosis. 7. The tricuspid valve is normal in structure. Tricuspid valve regurgitation is not demonstrated. 8. The aortic valve is normal in structure. Aortic valve regurgitation is trivial. No evidence of aortic valve sclerosis or stenosis. 9. The pulmonic valve was not well visualized. Pulmonic valve regurgitation is not visualized. 10. Unable to determine Pulmonary Artery Systolic Pressure, No TR jet spectral  display.  FINDINGS Left Ventricle: Left ventricular ejection fraction, by visual estimation, is 60 to 65%. The left ventricle has normal function. The left ventricle has no regional wall motion abnormalities. The left ventricular internal cavity size was the left ventricle is normal in size. There is no left ventricular hypertrophy. Left ventricular diastolic parameters were normal. Normal left atrial pressure.  Right Ventricle: The right ventricular size is normal. No increase in right ventricular wall thickness. Global RV systolic function is has normal systolic function.  Left Atrium: Left atrial size was normal in size.  Right Atrium: Right atrial size was normal in size  Pericardium: There is no evidence of pericardial effusion.  Mitral Valve: The mitral valve is normal in structure. Trivial mitral valve regurgitation. No evidence of mitral valve stenosis by observation.  Tricuspid Valve: The tricuspid valve is normal in structure. Tricuspid valve regurgitation is not demonstrated.  Aortic Valve: The aortic valve is normal in structure. Aortic valve regurgitation is trivial. The aortic valve is structurally normal, with no evidence of sclerosis or stenosis.  Pulmonic Valve: The pulmonic valve was not well visualized. Pulmonic valve regurgitation is not visualized. Pulmonic regurgitation is not visualized.  Aorta: The aortic root, ascending aorta and aortic arch are all structurally normal, with no evidence of dilitation or obstruction.  Venous: The inferior vena  cava is normal in size with greater than 50% respiratory variability, suggesting right atrial pressure of 3 mmHg.  IAS/Shunts: No atrial level shunt detected by color flow Doppler. There is no evidence of a patent foramen ovale. No ventricular septal defect is seen or detected. There is no evidence of an atrial septal defect.   LEFT VENTRICLE PLAX 2D LVIDd:         5.31 cm  Diastology LVIDs:         2.79 cm  LV e'  lateral:   13.10 cm/s LV PW:         1.04 cm  LV E/e' lateral: 6.2 LV IVS:        1.03 cm  LV e' medial:    10.40 cm/s LVOT diam:     2.30 cm  LV E/e' medial:  7.8 LV SV:         107 ml LV SV Index:   38.89 LVOT Area:     4.15 cm   RIGHT VENTRICLE             IVC RV Basal diam:  3.81 cm     IVC diam: 1.72 cm RV S prime:     14.50 cm/s TAPSE (M-mode): 2.5 cm  LEFT ATRIUM             Index       RIGHT ATRIUM           Index LA diam:        3.80 cm 1.45 cm/m  RA Area:     19.10 cm LA Vol (A2C):   88.4 ml 33.64 ml/m RA Volume:   53.50 ml  20.35 ml/m LA Vol (A4C):   52.2 ml 19.85 ml/m LA Biplane Vol: 69.4 ml 26.39 ml/m AORTIC VALVE LVOT Vmax:   80.40 cm/s LVOT Vmean:  54.800 cm/s LVOT VTI:    0.177 m  AORTA Ao Root diam: 3.90 cm Ao Asc diam:  3.30 cm  MITRAL VALVE MV Area (PHT): 3.54 cm             SHUNTS MV PHT:        62.06 msec           Systemic VTI:  0.18 m MV Decel Time: 214 msec             Systemic Diam: 2.30 cm MV E velocity: 80.60 cm/s 103 cm/s MV A velocity: 65.60 cm/s 70.3 cm/s MV E/A ratio:  1.23       1.5   Kardie Tobb DO Electronically signed by Thomasene Ripple DO Signature Date/Time: 01/11/2020/11:05:07 AM    Final              EKG:  EKG is ordered today.    Recent Labs: 05/20/2022: Magnesium 2.1 04/13/2023: ALT 22; TSH 0.87 04/21/2023: BUN 21; Creatinine, Ser 1.09; Hemoglobin 12.9; Platelets 197; Potassium 3.8; Sodium 138  Recent Lipid Panel    Component Value Date/Time   CHOL 126 04/13/2023 1438   CHOL 159 04/02/2021 0916   TRIG 61.0 04/13/2023 1438   HDL 51.70 04/13/2023 1438   HDL 65 04/02/2021 0916   CHOLHDL 2 04/13/2023 1438   VLDL 12.2 04/13/2023 1438   LDLCALC 62 04/13/2023 1438   LDLCALC 82 04/02/2021 0916     Risk Assessment/Calculations:                Physical Exam:    VS:  BP 106/62 (BP Location: Left Arm, Patient Position: Sitting, Cuff  Size: Normal)   Pulse 96   Ht 6\' 3"  (1.905 m)   Wt (!) 310 lb (140.6 kg)    BMI 38.75 kg/m     Wt Readings from Last 3 Encounters:  04/28/23 (!) 310 lb (140.6 kg)  04/21/23 (!) 315 lb (142.9 kg)  04/13/23 (!) 313 lb 6.4 oz (142.2 kg)     GEN:  pleasant male, morbidly obese, in motorized chair, no acute distress HEENT: Normal NECK: No JVD; No carotid bruits LYMPHATICS: No lymphadenopathy CARDIAC: RRR, no murmurs, rubs, gallops RESPIRATORY:  Clear to auscultation without rales, wheezing or rhonchi  ABDOMEN: Soft, non-tender, non-distended MUSCULOSKELETAL:  1+ pretibial edema; No deformity  SKIN: Warm and dry NEUROLOGIC:  Alert and oriented x 3 PSYCHIATRIC:  Normal affect   ASSESSMENT:    1. Coronary artery disease involving native coronary artery of native heart with angina pectoris (HCC)   2. Essential hypertension   3. Mixed hyperlipidemia    PLAN:    In order of problems listed above:  The patient is stable.  His angina was highly atypical and probably musculoskeletal in nature.  He will continue his current medical program.  No changes are made today.  His sublingual nitroglycerin is refilled as his is out of date. Blood pressure is well-controlled.  He will continue on olmesartan/hydrochlorothiazide. Recent labs reviewed with a cholesterol of 126, LDL 62, HDL 52.  Continue atorvastatin at current dose.      Medication Adjustments/Labs and Tests Ordered: Current medicines are reviewed at length with the patient today.  Concerns regarding medicines are outlined above.  No orders of the defined types were placed in this encounter.  Meds ordered this encounter  Medications   nitroGLYCERIN (NITROSTAT) 0.4 MG SL tablet    Sig: PLACE 1 TABLET UNDER THE TOUNGE EVERY 5 MINUTES AS NEEDED FOR CHEST PAIN (X 3 DOSES)    Dispense:  25 tablet    Refill:  6    Patient Instructions  Medication Instructions:  Your physician recommends that you continue on your current medications as directed. Please refer to the Current Medication list given to you  today.  *If you need a refill on your cardiac medications before your next appointment, please call your pharmacy*   Lab Work: none If you have labs (blood work) drawn today and your tests are completely normal, you will receive your results only by: MyChart Message (if you have MyChart) OR A paper copy in the mail If you have any lab test that is abnormal or we need to change your treatment, we will call you to review the results.   Testing/Procedures: none   Follow-Up: At [facility], you and your health needs are our priority.  As part of our continuing mission to provide you with exceptional heart care, we have created designated Provider Care Teams.  These Care Teams include your primary Cardiologist (physician) and Advanced Practice Providers (APPs -  Physician Assistants and Nurse Practitioners) who all work together to provide you with the care you need, when you need it.  We recommend signing up for the patient portal called "MyChart".  Sign up information is provided on this After Visit Summary.  MyChart is used to connect with patients for Virtual Visits (Telemedicine).  Patients are able to view lab/test results, encounter notes, upcoming appointments, etc.  Non-urgent messages can be sent to your provider as well.   To learn more about what you can do with MyChart, go to [email or web address].  Your next appointment:   12 month(s)  Provider:   Tonny Bollman, MD     Other Instructions      Signed, Tonny Bollman, MD  04/28/2023 5:40 PM    [facility]

## 2023-04-28 NOTE — Patient Instructions (Signed)
Medication Instructions:  Your physician recommends that you continue on your current medications as directed. Please refer to the Current Medication list given to you today.  *If you need a refill on your cardiac medications before your next appointment, please call your pharmacy*   Lab Work: none If you have labs (blood work) drawn today and your tests are completely normal, you will receive your results only by: MyChart Message (if you have MyChart) OR A paper copy in the mail If you have any lab test that is abnormal or we need to change your treatment, we will call you to review the results.   Testing/Procedures: none   Follow-Up: At [facility], you and your health needs are our priority.  As part of our continuing mission to provide you with exceptional heart care, we have created designated Provider Care Teams.  These Care Teams include your primary Cardiologist (physician) and Advanced Practice Providers (APPs -  Physician Assistants and Nurse Practitioners) who all work together to provide you with the care you need, when you need it.  We recommend signing up for the patient portal called "MyChart".  Sign up information is provided on this After Visit Summary.  MyChart is used to connect with patients for Virtual Visits (Telemedicine).  Patients are able to view lab/test results, encounter notes, upcoming appointments, etc.  Non-urgent messages can be sent to your provider as well.   To learn more about what you can do with MyChart, go to [email or web address].    Your next appointment:   12 month(s)  Provider:   Michael Cooper, MD     Other Instructions   

## 2023-05-06 ENCOUNTER — Other Ambulatory Visit: Payer: Self-pay

## 2023-05-06 ENCOUNTER — Encounter: Payer: Self-pay | Admitting: Family Medicine

## 2023-05-06 DIAGNOSIS — K219 Gastro-esophageal reflux disease without esophagitis: Secondary | ICD-10-CM

## 2023-05-06 MED ORDER — PANTOPRAZOLE SODIUM 40 MG PO TBEC
40.0000 mg | DELAYED_RELEASE_TABLET | Freq: Every day | ORAL | 1 refills | Status: DC
Start: 2023-05-06 — End: 2023-10-01

## 2023-05-07 ENCOUNTER — Ambulatory Visit: Payer: 59 | Admitting: Psychology

## 2023-05-19 ENCOUNTER — Other Ambulatory Visit: Payer: Managed Care, Other (non HMO)

## 2023-05-21 ENCOUNTER — Ambulatory Visit (INDEPENDENT_AMBULATORY_CARE_PROVIDER_SITE_OTHER): Payer: 59 | Admitting: Psychology

## 2023-05-21 DIAGNOSIS — F331 Major depressive disorder, recurrent, moderate: Secondary | ICD-10-CM | POA: Diagnosis not present

## 2023-05-21 NOTE — Progress Notes (Signed)
[facility] Counselor/Therapist Progress Note  Patient ID: VADEN BECHERER, MRN: [id number],    Date: 05/21/2023  Time Spent: 11:00am-11:50am  50 minutes   Treatment Type: Individual Therapy  Reported Symptoms: stress  Mental Status Exam: Appearance:  Casual     Behavior: Appropriate  Motor: Normal  Speech/Language:  Normal Rate  Affect: Appropriate  Mood: normal  Thought process: normal  Thought content:   WNL  Sensory/Perceptual disturbances:   WNL  Orientation: oriented to person, place, time/date, and situation  Attention: Good  Concentration: Good  Memory: WNL  Fund of knowledge:  Good  Insight:   Good  Judgment:  Good  Impulse Control: Good   Risk Assessment: Danger to Self:  No Self-injurious Behavior: No Danger to Others: No Duty to Warn:no Physical Aggression / Violence:No  Access to Firearms a concern: No  Gang Involvement:No   Subjective: Pt present for face-to-face individual therapy via video.  Pt consents to telehealth video session and is aware of limitations of video sessions.  Location of pt: home Location of therapist: home office.   Pt talked about preparing to go on a 2 week trip for work and to see his son [name] in [place].  [name] will travel with him.  Pt is looking forward to the trip. Pt talked about work.   He has been very busy.  Pt talked to his manager about being promoted to a level that reflects the work he does.  Pt feels frustrated that he is doing the work but has not been given the title and compensation.  Addressed pt's frustrations.   Pt talked about financial worries bc [name] wants to buy a new car.   Pt talked about his health.  He saw the cardiologist and learned that he has a hiatal hernia.  He will have follow up appointment with his PCP and come up with treatment plan to treat the hernia.  Pt states his mood has been fairly stable the past few weeks.   Worked on self care strategies. Provided supportive  therapy.  Interventions: Cognitive Behavioral Therapy and Insight-Oriented  Diagnosis:  F33.1  Plan of Care: Recommend ongoing therapy.   Plan to meet every two weeks.   Pt is progressing toward goals.    Treatment Plan  (Treatment Plan Target Date: 04/08/2024) Client Abilities/Strengths  Pt is bright, engaging, and motivated for therapy.   Client Treatment Preferences  Individual therapy.  Client Statement of Needs  Improve coping skills.  Symptoms  Depressed or irritable mood. Feelings of hopelessness, worthlessness, or inappropriate guilt. Low self-esteem.  Problems Addressed  Unipolar Depression Goals 1. Alleviate depressive symptoms and return to previous level of effective functioning. 2. Appropriately grieve the loss in order to normalize mood and to return to previously adaptive level of functioning. Objective Learn and implement behavioral strategies to overcome depression. Target Date: 2024-04-08 Frequency: Biweekly  Progress: 60 Modality: individual  Related Interventions Engage the client in "behavioral activation," increasing his/her activity level and contact with sources of reward, while identifying processes that inhibit activation.  Use behavioral techniques such as instruction, rehearsal, role-playing, role reversal, as needed, to facilitate activity in the client's daily life; reinforce success. Assist the client in developing skills that increase the likelihood of deriving pleasure from behavioral activation (e.g., assertiveness skills, developing an exercise plan, less internal/more external focus, increased social involvement); reinforce success. Objective Identify important people in life, past and present, and describe the quality, good and poor, of those relationships. Target Date: 2024-04-08  Frequency: Biweekly  Progress: 60 Modality: individual  Related Interventions Conduct Interpersonal Therapy beginning with the assessment of the client's  "interpersonal inventory" of important past and present relationships; develop a case formulation linking depression to grief, interpersonal role disputes, role transitions, and/or interpersonal deficits). Objective Learn and implement problem-solving and decision-making skills. Target Date: 2024-04-08 Frequency: Biweekly  Progress: 60 Modality: individual  Related Interventions Conduct Problem-Solving Therapy using techniques such as psychoeducation, modeling, and role-playing to teach client problem-solving skills (i.e., defining a problem specifically, generating possible solutions, evaluating the pros and cons of each solution, selecting and implementing a plan of action, evaluating the efficacy of the plan, accepting or revising the plan); role-play application of the problem-solving skill to a real life issue. Encourage in the client the development of a positive problem orientation in which problems and solving them are viewed as a natural part of life and not something to be feared, despaired, or avoided. 3. Develop healthy interpersonal relationships that lead to the alleviation and help prevent the relapse of depression. 4. Develop healthy thinking patterns and beliefs about self, others, and the world that lead to the alleviation and help prevent the relapse of depression. 5. Recognize, accept, and cope with feelings of depression. Diagnosis F33.1  Conditions For Discharge Achievement of treatment goals and objectives   Salomon Fick, LCSW

## 2023-06-04 ENCOUNTER — Ambulatory Visit (INDEPENDENT_AMBULATORY_CARE_PROVIDER_SITE_OTHER): Payer: 59 | Admitting: Psychology

## 2023-06-04 DIAGNOSIS — F331 Major depressive disorder, recurrent, moderate: Secondary | ICD-10-CM

## 2023-06-04 NOTE — Progress Notes (Signed)
[facility] Counselor/Therapist Progress Note  Patient ID: Keith Morrow, MRN: [id number],    Date: 06/04/2023  Time Spent: 11:10am-12:00pm  50 minutes   Treatment Type: Individual Therapy  Reported Symptoms: stress  Mental Status Exam: Appearance:  Casual     Behavior: Appropriate  Motor: Normal  Speech/Language:  Normal Rate  Affect: Appropriate  Mood: normal  Thought process: normal  Thought content:   WNL  Sensory/Perceptual disturbances:   WNL  Orientation: oriented to person, place, time/date, and situation  Attention: Good  Concentration: Good  Memory: WNL  Fund of knowledge:  Good  Insight:   Good  Judgment:  Good  Impulse Control: Good   Risk Assessment: Danger to Self:  No Self-injurious Behavior: No Danger to Others: No Duty to Warn:no Physical Aggression / Violence:No  Access to Firearms a concern: No  Gang Involvement:No   Subjective: Pt present for face-to-face individual therapy via video.  Pt consents to telehealth video session and is aware of limitations of video sessions.  Location of pt: home Location of therapist: home office.   Pt talked about his 10 day work trip and visit to see his son Keith Morrow.  The trip went well and he enjoyed time with [name] who accompanied him.   Pt talked about work.   He has been busy since back from his trip. Pt talked about having a stressful time when he first got home bc the plans did not go as he wanted and expected.   He states he grumbled about it and was frustrated.  Worked on Optician, dispensing.   Worked on self care strategies. Provided supportive therapy.  Interventions: Cognitive Behavioral Therapy and Insight-Oriented  Diagnosis:  F33.1  Plan of Care: Recommend ongoing therapy.   Plan to meet every two weeks.   Pt is progressing toward goals.    Treatment Plan  (Treatment Plan Target Date: 04/08/2024) Client Abilities/Strengths  Pt is bright, engaging, and motivated for therapy.    Client Treatment Preferences  Individual therapy.  Client Statement of Needs  Improve coping skills.  Symptoms  Depressed or irritable mood. Feelings of hopelessness, worthlessness, or inappropriate guilt. Low self-esteem.  Problems Addressed  Unipolar Depression Goals 1. Alleviate depressive symptoms and return to previous level of effective functioning. 2. Appropriately grieve the loss in order to normalize mood and to return to previously adaptive level of functioning. Objective Learn and implement behavioral strategies to overcome depression. Target Date: 2024-04-08 Frequency: Biweekly  Progress: 60 Modality: individual  Related Interventions Engage the client in "behavioral activation," increasing his/her activity level and contact with sources of reward, while identifying processes that inhibit activation.  Use behavioral techniques such as instruction, rehearsal, role-playing, role reversal, as needed, to facilitate activity in the client's daily life; reinforce success. Assist the client in developing skills that increase the likelihood of deriving pleasure from behavioral activation (e.g., assertiveness skills, developing an exercise plan, less internal/more external focus, increased social involvement); reinforce success. Objective Identify important people in life, past and present, and describe the quality, good and poor, of those relationships. Target Date: 2024-04-08 Frequency: Biweekly  Progress: 60 Modality: individual  Related Interventions Conduct Interpersonal Therapy beginning with the assessment of the client's "interpersonal inventory" of important past and present relationships; develop a case formulation linking depression to grief, interpersonal role disputes, role transitions, and/or interpersonal deficits). Objective Learn and implement problem-solving and decision-making skills. Target Date: 2024-04-08 Frequency: Biweekly  Progress: 60 Modality: individual   Related Interventions Conduct Problem-Solving  Therapy using techniques such as psychoeducation, modeling, and role-playing to teach client problem-solving skills (i.e., defining a problem specifically, generating possible solutions, evaluating the pros and cons of each solution, selecting and implementing a plan of action, evaluating the efficacy of the plan, accepting or revising the plan); role-play application of the problem-solving skill to a real life issue. Encourage in the client the development of a positive problem orientation in which problems and solving them are viewed as a natural part of life and not something to be feared, despaired, or avoided. 3. Develop healthy interpersonal relationships that lead to the alleviation and help prevent the relapse of depression. 4. Develop healthy thinking patterns and beliefs about self, others, and the world that lead to the alleviation and help prevent the relapse of depression. 5. Recognize, accept, and cope with feelings of depression. Diagnosis F33.1  Conditions For Discharge Achievement of treatment goals and objectives   Clint Bolder, LCSW

## 2023-06-06 ENCOUNTER — Other Ambulatory Visit: Payer: Self-pay | Admitting: Neurology

## 2023-06-08 ENCOUNTER — Other Ambulatory Visit: Payer: Self-pay

## 2023-06-18 ENCOUNTER — Ambulatory Visit (INDEPENDENT_AMBULATORY_CARE_PROVIDER_SITE_OTHER): Payer: 59 | Admitting: Psychology

## 2023-06-18 DIAGNOSIS — F331 Major depressive disorder, recurrent, moderate: Secondary | ICD-10-CM | POA: Diagnosis not present

## 2023-06-18 NOTE — Progress Notes (Signed)
[facility] Counselor/Therapist Progress Note  Patient ID: GARRICK MIDGLEY, MRN: [id number],    Date: 06/18/2023  Time Spent: 4:00pm-4:50pm    50 minutes   Treatment Type: Individual Therapy  Reported Symptoms: stress  Mental Status Exam: Appearance:  Casual     Behavior: Appropriate  Motor: Normal  Speech/Language:  Normal Rate  Affect: Appropriate  Mood: normal  Thought process: normal  Thought content:   WNL  Sensory/Perceptual disturbances:   WNL  Orientation: oriented to person, place, time/date, and situation  Attention: Good  Concentration: Good  Memory: WNL  Fund of knowledge:  Good  Insight:   Good  Judgment:  Good  Impulse Control: Good   Risk Assessment: Danger to Self:  No Self-injurious Behavior: No Danger to Others: No Duty to Warn:no Physical Aggression / Violence:No  Access to Firearms a concern: No  Gang Involvement:No   Subjective: Pt present for face-to-face individual therapy via video.  Pt consents to telehealth video session and is aware of limitations of video sessions.  Location of pt: home Location of therapist: home office.   Pt talked about his son [name] being in [place].  The trip is going well for him and pt has been in frequent communication with him.   Pt talked about work.  He has been very busy with work meetings.  There are a lot of projects coming up that pt is feeling stressed about.  Worked on Optician, dispensing.   Pt states the past couple of weeks he has not had a lot of agitation.  The house was more quiet bc both dogs weren't home.  Pt identified that the barking noise agitates him. Addressed how pt can mitigate the noise issues. Pt is going to be restarting PT which he hopes will be helpful.   Worked on self care strategies. Provided supportive therapy.  Interventions: Cognitive Behavioral Therapy and Insight-Oriented  Diagnosis:  F33.1  Plan of Care: Recommend ongoing therapy.   Plan to meet every two weeks.    Pt is progressing toward goals.    Treatment Plan  (Treatment Plan Target Date: 04/08/2024) Client Abilities/Strengths  Pt is bright, engaging, and motivated for therapy.   Client Treatment Preferences  Individual therapy.  Client Statement of Needs  Improve coping skills.  Symptoms  Depressed or irritable mood. Feelings of hopelessness, worthlessness, or inappropriate guilt. Low self-esteem.  Problems Addressed  Unipolar Depression Goals 1. Alleviate depressive symptoms and return to previous level of effective functioning. 2. Appropriately grieve the loss in order to normalize mood and to return to previously adaptive level of functioning. Objective Learn and implement behavioral strategies to overcome depression. Target Date: 2024-04-08 Frequency: Biweekly  Progress: 60 Modality: individual  Related Interventions Engage the client in "behavioral activation," increasing his/her activity level and contact with sources of reward, while identifying processes that inhibit activation.  Use behavioral techniques such as instruction, rehearsal, role-playing, role reversal, as needed, to facilitate activity in the client's daily life; reinforce success. Assist the client in developing skills that increase the likelihood of deriving pleasure from behavioral activation (e.g., assertiveness skills, developing an exercise plan, less internal/more external focus, increased social involvement); reinforce success. Objective Identify important people in life, past and present, and describe the quality, good and poor, of those relationships. Target Date: 2024-04-08 Frequency: Biweekly  Progress: 60 Modality: individual  Related Interventions Conduct Interpersonal Therapy beginning with the assessment of the client's "interpersonal inventory" of important past and present relationships; develop a case formulation linking depression  to grief, interpersonal role disputes, role transitions, and/or  interpersonal deficits). Objective Learn and implement problem-solving and decision-making skills. Target Date: 2024-04-08 Frequency: Biweekly  Progress: 60 Modality: individual  Related Interventions Conduct Problem-Solving Therapy using techniques such as psychoeducation, modeling, and role-playing to teach client problem-solving skills (i.e., defining a problem specifically, generating possible solutions, evaluating the pros and cons of each solution, selecting and implementing a plan of action, evaluating the efficacy of the plan, accepting or revising the plan); role-play application of the problem-solving skill to a real life issue. Encourage in the client the development of a positive problem orientation in which problems and solving them are viewed as a natural part of life and not something to be feared, despaired, or avoided. 3. Develop healthy interpersonal relationships that lead to the alleviation and help prevent the relapse of depression. 4. Develop healthy thinking patterns and beliefs about self, others, and the world that lead to the alleviation and help prevent the relapse of depression. 5. Recognize, accept, and cope with feelings of depression. Diagnosis F33.1  Conditions For Discharge Achievement of treatment goals and objectives   Salomon Fick, LCSW

## 2023-06-22 ENCOUNTER — Other Ambulatory Visit: Payer: Self-pay | Admitting: Family

## 2023-06-22 ENCOUNTER — Encounter: Payer: Self-pay | Admitting: Family Medicine

## 2023-06-23 ENCOUNTER — Other Ambulatory Visit (INDEPENDENT_AMBULATORY_CARE_PROVIDER_SITE_OTHER): Payer: Managed Care, Other (non HMO)

## 2023-06-23 ENCOUNTER — Telehealth: Payer: Self-pay | Admitting: *Deleted

## 2023-06-23 DIAGNOSIS — D649 Anemia, unspecified: Secondary | ICD-10-CM | POA: Diagnosis not present

## 2023-06-23 NOTE — Telephone Encounter (Signed)
Received fax of intrathecal home infusion NCP.  Scan to media., Placed copy in file.

## 2023-06-24 ENCOUNTER — Other Ambulatory Visit: Payer: Self-pay | Admitting: Family Medicine

## 2023-06-24 ENCOUNTER — Other Ambulatory Visit: Payer: Self-pay

## 2023-06-24 ENCOUNTER — Encounter: Payer: Self-pay | Admitting: Family Medicine

## 2023-06-24 DIAGNOSIS — G35 Multiple sclerosis: Secondary | ICD-10-CM

## 2023-06-24 DIAGNOSIS — M25512 Pain in left shoulder: Secondary | ICD-10-CM

## 2023-06-24 DIAGNOSIS — K219 Gastro-esophageal reflux disease without esophagitis: Secondary | ICD-10-CM

## 2023-06-24 DIAGNOSIS — R252 Cramp and spasm: Secondary | ICD-10-CM

## 2023-06-24 LAB — IRON,TIBC AND FERRITIN PANEL
%SAT: 21 % (calc) (ref 20–48)
Ferritin: 21 ng/mL — ABNORMAL LOW (ref 38–380)
Iron: 71 ug/dL (ref 50–180)
TIBC: 335 mcg/dL (calc) (ref 250–425)

## 2023-06-24 NOTE — Telephone Encounter (Signed)
Called pt spoke with regarding referral to Gi and told pt I had looked at old notes And we did get the referral sent GI.Told him that he should hear something soon from GI and pt stated understand.

## 2023-06-24 NOTE — Telephone Encounter (Signed)
What's the dx code for the referral to gastro

## 2023-07-02 ENCOUNTER — Ambulatory Visit: Payer: 59 | Admitting: Psychology

## 2023-07-08 ENCOUNTER — Encounter (INDEPENDENT_AMBULATORY_CARE_PROVIDER_SITE_OTHER): Payer: Self-pay

## 2023-07-16 ENCOUNTER — Ambulatory Visit: Payer: 59 | Admitting: Psychology

## 2023-07-16 DIAGNOSIS — F331 Major depressive disorder, recurrent, moderate: Secondary | ICD-10-CM | POA: Diagnosis not present

## 2023-07-16 NOTE — Progress Notes (Signed)
[facility] Counselor/Therapist Progress Note  Patient ID: Keith Morrow, MRN: [id number],    Date: 07/16/2023  Time Spent: 3:00pm-3:50pm    50 minutes   Treatment Type: Individual Therapy  Reported Symptoms: stress  Mental Status Exam: Appearance:  Casual     Behavior: Appropriate  Motor: Normal  Speech/Language:  Normal Rate  Affect: Appropriate  Mood: normal  Thought process: normal  Thought content:   WNL  Sensory/Perceptual disturbances:   WNL  Orientation: oriented to person, place, time/date, and situation  Attention: Good  Concentration: Good  Memory: WNL  Fund of knowledge:  Good  Insight:   Good  Judgment:  Good  Impulse Control: Good   Risk Assessment: Danger to Self:  No Self-injurious Behavior: No Danger to Others: No Duty to Warn:no Physical Aggression / Violence:No  Access to Firearms a concern: No  Gang Involvement:No   Subjective: Pt present for face-to-face individual therapy via video.  Pt consents to telehealth video session and is aware of limitations of video sessions.  Location of pt: home Location of therapist: home office.   [place].  Extended family is there with them and they are having a good family visit.   Pt talked about having gaps in memories that make it difficult for him to bring up important therapy topics.   He is going to start writing a list with the help of his wife so he comes prepared with a list of issues to address in therapy. Pt had a list today that included childhood issues.  Pt was sent to his room a lot and tended to lash out in anger.  Pt's childhood family has gone on vacations without him.   This has created abandonment issues for pt.   Pt talked about thinking about how he would feel when his parents die.   He is not sure that he would be that upset.  Pt does not like his mother's husband.  Pt does not like his father's wife either.   Pt was tearful as  he talked about feeling badly that  he does not feel he will miss them when they are gone.  Helped pt process his feelings.  Pt has created a relationship with his sister who is 10 years younger than him.  Pt did not feel like he mattered in his family.  Pt states he has trouble dealing with the past.   Worked on self care strategies. Provided supportive therapy.  Interventions: Cognitive Behavioral Therapy and Insight-Oriented  Diagnosis:  F33.1  Plan of Care: Recommend ongoing therapy.   Plan to meet every two weeks.   Pt is progressing toward goals.    Treatment Plan  (Treatment Plan Target Date: 04/08/2024) Client Abilities/Strengths  Pt is bright, engaging, and motivated for therapy.   Client Treatment Preferences  Individual therapy.  Client Statement of Needs  Improve coping skills.  Symptoms  Depressed or irritable mood. Feelings of hopelessness, worthlessness, or inappropriate guilt. Low self-esteem.  Problems Addressed  Unipolar Depression Goals 1. Alleviate depressive symptoms and return to previous level of effective functioning. 2. Appropriately grieve the loss in order to normalize mood and to return to previously adaptive level of functioning. Objective Learn and implement behavioral strategies to overcome depression. Target Date: 2024-04-08 Frequency: Biweekly  Progress: 60 Modality: individual  Related Interventions Engage the client in "behavioral activation," increasing his/her activity level and contact with sources of reward, while identifying processes  that inhibit activation.  Use behavioral techniques such as instruction, rehearsal, role-playing, role reversal, as needed, to facilitate activity in the client's daily life; reinforce success. Assist the client in developing skills that increase the likelihood of deriving pleasure from behavioral activation (e.g., assertiveness skills, developing an exercise plan, less internal/more external focus, increased  social involvement); reinforce success. Objective Identify important people in life, past and present, and describe the quality, good and poor, of those relationships. Target Date: 2024-04-08 Frequency: Biweekly  Progress: 60 Modality: individual  Related Interventions Conduct Interpersonal Therapy beginning with the assessment of the client's "interpersonal inventory" of important past and present relationships; develop a case formulation linking depression to grief, interpersonal role disputes, role transitions, and/or interpersonal deficits). Objective Learn and implement problem-solving and decision-making skills. Target Date: 2024-04-08 Frequency: Biweekly  Progress: 60 Modality: individual  Related Interventions Conduct Problem-Solving Therapy using techniques such as psychoeducation, modeling, and role-playing to teach client problem-solving skills (i.e., defining a problem specifically, generating possible solutions, evaluating the pros and cons of each solution, selecting and implementing a plan of action, evaluating the efficacy of the plan, accepting or revising the plan); role-play application of the problem-solving skill to a real life issue. Encourage in the client the development of a positive problem orientation in which problems and solving them are viewed as a natural part of life and not something to be feared, despaired, or avoided. 3. Develop healthy interpersonal relationships that lead to the alleviation and help prevent the relapse of depression. 4. Develop healthy thinking patterns and beliefs about self, others, and the world that lead to the alleviation and help prevent the relapse of depression. 5. Recognize, accept, and cope with feelings of depression. Diagnosis F33.1  Conditions For Discharge Achievement of treatment goals and objectives   Salomon Fick, LCSW

## 2023-07-20 ENCOUNTER — Other Ambulatory Visit: Payer: Self-pay | Admitting: Neurology

## 2023-07-20 NOTE — Telephone Encounter (Signed)
Last seen on 03/12/23 Follow up scheduled on 09/22/23

## 2023-07-26 ENCOUNTER — Other Ambulatory Visit: Payer: Self-pay | Admitting: Family

## 2023-07-28 ENCOUNTER — Encounter: Payer: Managed Care, Other (non HMO) | Admitting: Family Medicine

## 2023-07-30 ENCOUNTER — Ambulatory Visit: Payer: 59 | Admitting: Psychology

## 2023-07-30 DIAGNOSIS — F331 Major depressive disorder, recurrent, moderate: Secondary | ICD-10-CM | POA: Diagnosis not present

## 2023-07-30 NOTE — Progress Notes (Signed)
[facility] Counselor/Therapist Progress Note  Patient ID: Keith Morrow, MRN: [id number],    Date: 07/30/2023  Time Spent: 11:00am-11:50am    50 minutes   Treatment Type: Individual Therapy  Reported Symptoms: stress  Mental Status Exam: Appearance:  Casual     Behavior: Appropriate  Motor: Normal  Speech/Language:  Normal Rate  Affect: Appropriate  Mood: normal  Thought process: normal  Thought content:   WNL  Sensory/Perceptual disturbances:   WNL  Orientation: oriented to person, place, time/date, and situation  Attention: Good  Concentration: Good  Memory: WNL  Fund of knowledge:  Good  Insight:   Good  Judgment:  Good  Impulse Control: Good   Risk Assessment: Danger to Self:  No Self-injurious Behavior: No Danger to Others: No Duty to Warn:no Physical Aggression / Violence:No  Access to Firearms a concern: No  Gang Involvement:No   Subjective: Pt present for face-to-face individual therapy via video.  Pt consents to telehealth video session and is aware of limitations and benefits of video sessions.  Location of pt: home Location of therapist: home office.   Pt talked about just getting back from a trip.  He was in [place] for work and then went to [place] to see his son Keith Morrow.   Pt states the trip went well and they had a good visit with Keith Morrow.  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  Pt was disappointed that they did not get to stay longer.   Pt got frustrated with his mother during the trip bc she was always late to events.  Pt's mother tends to be perpetually late.  Pt states he over reacted and had a "screaming fit" about how his mother is always ruining things bc of being late.  Addressed pt's reactions and helped him process his feelings.  Addressed pt's feelings about his mother and his anger.  Helped pt process the relationship dynamics.   Worked on self care strategies. Provided supportive therapy.  Interventions: Cognitive Behavioral  Therapy and Insight-Oriented  Diagnosis:  F33.1  Plan of Care: Recommend ongoing therapy.   Plan to meet every two weeks.   Pt is progressing toward goals.    Treatment Plan  (Treatment Plan Target Date: 04/08/2024) Client Abilities/Strengths  Pt is bright, engaging, and motivated for therapy.   Client Treatment Preferences  Individual therapy.  Client Statement of Needs  Improve coping skills.  Symptoms  Depressed or irritable mood. Feelings of hopelessness, worthlessness, or inappropriate guilt. Low self-esteem.  Problems Addressed  Unipolar Depression Goals 1. Alleviate depressive symptoms and return to previous level of effective functioning. 2. Appropriately grieve the loss in order to normalize mood and to return to previously adaptive level of functioning. Objective Learn and implement behavioral strategies to overcome depression. Target Date: 2024-04-08 Frequency: Biweekly  Progress: 60 Modality: individual  Related Interventions Engage the client in "behavioral activation," increasing his/her activity level and contact with sources of reward, while identifying processes that inhibit activation.  Use behavioral techniques such as instruction, rehearsal, role-playing, role reversal, as needed, to facilitate activity in the client's daily life; reinforce success. Assist the client in developing skills that increase the likelihood of deriving pleasure from behavioral activation (e.g., assertiveness skills, developing an exercise plan, less internal/more external focus, increased social involvement); reinforce success. Objective Identify important people in life, past and present, and describe the quality, good and poor, of those relationships. Target Date: 2024-04-08 Frequency: Biweekly  Progress: 60 Modality: individual  Related Interventions Conduct Interpersonal  Therapy beginning with the assessment of the client's "interpersonal inventory" of important past and present  relationships; develop a case formulation linking depression to grief, interpersonal role disputes, role transitions, and/or interpersonal deficits). Objective Learn and implement problem-solving and decision-making skills. Target Date: 2024-04-08 Frequency: Biweekly  Progress: 60 Modality: individual  Related Interventions Conduct Problem-Solving Therapy using techniques such as psychoeducation, modeling, and role-playing to teach client problem-solving skills (i.e., defining a problem specifically, generating possible solutions, evaluating the pros and cons of each solution, selecting and implementing a plan of action, evaluating the efficacy of the plan, accepting or revising the plan); role-play application of the problem-solving skill to a real life issue. Encourage in the client the development of a positive problem orientation in which problems and solving them are viewed as a natural part of life and not something to be feared, despaired, or avoided. 3. Develop healthy interpersonal relationships that lead to the alleviation and help prevent the relapse of depression. 4. Develop healthy thinking patterns and beliefs about self, others, and the world that lead to the alleviation and help prevent the relapse of depression. 5. Recognize, accept, and cope with feelings of depression. Diagnosis F33.1  Conditions For Discharge Achievement of treatment goals and objectives   Salomon Fick, LCSW

## 2023-08-06 ENCOUNTER — Ambulatory Visit (INDEPENDENT_AMBULATORY_CARE_PROVIDER_SITE_OTHER): Payer: Managed Care, Other (non HMO) | Admitting: Family Medicine

## 2023-08-06 ENCOUNTER — Encounter: Payer: Self-pay | Admitting: Family Medicine

## 2023-08-06 VITALS — BP 95/59 | HR 78 | Ht 75.0 in | Wt 298.0 lb

## 2023-08-06 DIAGNOSIS — D649 Anemia, unspecified: Secondary | ICD-10-CM

## 2023-08-06 DIAGNOSIS — R739 Hyperglycemia, unspecified: Secondary | ICD-10-CM | POA: Diagnosis not present

## 2023-08-06 DIAGNOSIS — E782 Mixed hyperlipidemia: Secondary | ICD-10-CM | POA: Diagnosis not present

## 2023-08-06 DIAGNOSIS — Z23 Encounter for immunization: Secondary | ICD-10-CM | POA: Diagnosis not present

## 2023-08-06 DIAGNOSIS — Z0000 Encounter for general adult medical examination without abnormal findings: Secondary | ICD-10-CM | POA: Diagnosis not present

## 2023-08-06 NOTE — Patient Instructions (Signed)
Lifestyle measures for reflux: - Avoid meals or carbonated beverages within 3 hours of bedtime - Minimize intake of fried, fatty, and spicy foods (this will help decrease gastric acid production) - Raise the head of the bed using 4-6 inch blocks (especially if symptoms are present at night) - Maintain a healthy weight and avoid tight fitting clothes, especially around the waist  - Avoid foods that relax the sphincter or worsen symptoms (chocolate, peppermint, fatty foods, citrus, spicy foods, tomatoes, coffee, caffeine)  - Minimize use of NSAIDs (ibuprofen, Aleve, etc), nicotine, and alcohol

## 2023-08-06 NOTE — Progress Notes (Signed)
Complete physical exam  Patient: Keith Morrow   DOB: [birth year]   52 y.o. Male  MRN: [id number]  Subjective:    Chief Complaint  Patient presents with   Annual Exam    Keith Morrow is a 52 y.o. male who presents today for a complete physical exam. He reports consuming a general diet. Exercise is limited by mostly wheechair bound. He generally feels well. He reports sleeping well. He does not have additional problems to discuss today.   Currently lives with: wife and son Acute concerns or interim problems since last visit: no  Vision concerns: no, due for regular eye exam  Dental concerns: no dental concerns   Patient endorses ETOH use- 2 servings per week. Patient denies nicotine use. Patient denies illegal substance use.       Most recent fall risk assessment:    08/06/2023    2:29 PM  Fall Risk   Falls in the past year? 1  Number falls in past yr: 1  Injury with Fall? 0  Risk for fall due to : History of fall(s)  Follow up Falls evaluation completed     Most recent depression screenings:    08/06/2023    2:29 PM 04/13/2023    1:52 PM  PHQ 2/9 Scores  PHQ - 2 Score 2 0  PHQ- 9 Score 14 0            Patient Care Team: [name], MD as PCP - General (Family Medicine) Tonny Bollman, MD as PCP - Cardiology (Cardiology)   Outpatient Medications Prior to Visit  Medication Sig   aspirin EC 81 MG tablet Take 81 mg by mouth daily.   atorvastatin (LIPITOR) 40 MG tablet TAKE 1 TABLET DAILY AT 6 P.M.   BACLOFEN IT 1 Dose by Intrathecal route as directed.    busPIRone (BUSPAR) 10 MG tablet Take 1 tablet (10 mg total) by mouth in the morning, at noon, in the evening, and at bedtime.   cetirizine (ZYRTEC) 10 MG tablet Take 10 mg by mouth daily as needed for allergies.   clonazePAM (KLONOPIN) 1 MG tablet Take 1 tablet (1 mg total) by mouth 4 (four) times daily.   Clonidine HCl POWD by Does not apply route. 720mg /ml IT Baclofen. Total dose 157.89mg /day.  Last pump refill 06/02/16   clotrimazole-betamethasone (LOTRISONE) cream Apply 1 application topically 2 (two) times daily as needed (anti-fungal).   cyclobenzaprine (FLEXERIL) 10 MG tablet TAKE 1 TABLET BY MOUTH THREE TIMES A DAY AS NEEDED FOR MUSCLE SPASMS   CYMBALTA 60 MG capsule TAKE 1 CAPSULE TWICE A DAY   dalfampridine 10 MG TB12 TAKE 1 TABLET EVERY 12 HOURS   famotidine (PEPCID) 40 MG tablet TAKE 1 TABLET DAILY AT BEDTIME   Fe Fum-FA-B Cmp-C-Zn-Mg-Mn-Cu (HEMOCYTE-PLUS) 106-1 MG TABS Take time daily.   furosemide (LASIX) 20 MG tablet TAKE 1 TABLET (20 MG TOTAL) BY MOUTH DAILY X 5 DAYS, THEN AS NEEDED FOR FOR SHORTNESS OF BREATH/WEIGHT GAIN>3#, PEDAL EDEMA AND CAN TAKE A SECOND TAB DAILY AS NEEDED   gabapentin (NEURONTIN) 600 MG tablet Take 1 tablet (600 mg total) by mouth 4 (four) times daily.   ibuprofen (ADVIL,MOTRIN) 200 MG tablet Take 200 mg by mouth every 6 (six) hours as needed for moderate pain.   ketoconazole (NIZORAL) 2 % shampoo APPLY TOPICALLY 2 TIMES A WEEK   levothyroxine (SYNTHROID) 137 MCG tablet Take 1 tablet (137 mcg total) by mouth daily before breakfast.   methylphenidate (RITALIN) 10  MG tablet Take 1 tablet (10 mg total) by mouth 4 (four) times daily.   mupirocin ointment (BACTROBAN) 2 % Apply 1 Application topically 2 (two) times daily.   natalizumab (TYSABRI) 300 MG/15ML injection Inject 300 mg into the vein every 28 (twenty-eight) days.    nitroGLYCERIN (NITROSTAT) 0.4 MG SL tablet PLACE 1 TABLET UNDER THE TOUNGE EVERY 5 MINUTES AS NEEDED FOR CHEST PAIN (X 3 DOSES)   nystatin cream (MYCOSTATIN) Apply 1 application  topically as directed.   olmesartan-hydrochlorothiazide (BENICAR HCT) 20-12.5 MG tablet TAKE 1 TABLET DAILY   pantoprazole (PROTONIX) 40 MG tablet Take 1 tablet (40 mg total) by mouth daily.   potassium chloride SA (KLOR-CON M20) 20 MEQ tablet TAKE 1 TABLET BY MOUTH EVERY DAY AS NEEDED WHEN TAKING A SECOND LASIX TAB ANY GIVEN DAY   sucralfate (CARAFATE) 1 g  tablet Take 1 tablet (1 g total) by mouth 4 (four) times daily.   sucralfate (CARAFATE) 1 GM/10ML suspension Take 10 mLs (1 g total) by mouth 4 (four) times daily -  with meals and at bedtime. Take liquid or tablets but not both   tamsulosin (FLOMAX) 0.4 MG CAPS capsule TAKE 1 CAPSULE DAILY   No facility-administered medications prior to visit.    ROS All review of systems negative except what is listed in the HPI        Objective:     BP (!) 95/59   Pulse 78   Ht 6\' 3"  (1.905 m)   Wt 298 lb (135.2 kg)   SpO2 98%   BMI 37.25 kg/m    Physical Exam Vitals reviewed.  Constitutional:      General: He is not in acute distress.    Appearance: Normal appearance. He is not ill-appearing.  HENT:     Head: Normocephalic and atraumatic.     Right Ear: Tympanic membrane normal.     Left Ear: Tympanic membrane normal.     Nose: Nose normal.     Mouth/Throat:     Mouth: Mucous membranes are moist.     Pharynx: Oropharynx is clear.  Eyes:     Extraocular Movements: Extraocular movements intact.     Conjunctiva/sclera: Conjunctivae normal.     Pupils: Pupils are equal, round, and reactive to light.  Neck:     Vascular: No carotid bruit.  Cardiovascular:     Rate and Rhythm: Normal rate and regular rhythm.     Pulses: Normal pulses.     Heart sounds: Normal heart sounds.  Pulmonary:     Effort: Pulmonary effort is normal.     Breath sounds: Normal breath sounds. No wheezing, rhonchi or rales.  Abdominal:     General: Abdomen is flat. Bowel sounds are normal. There is no distension.     Palpations: Abdomen is soft. There is no mass.     Tenderness: There is no abdominal tenderness. There is no right CVA tenderness, left CVA tenderness, guarding or rebound.  Genitourinary:    Comments: Deferred exam Musculoskeletal:     Cervical back: Normal range of motion and neck supple. No tenderness.     Right lower leg: Edema present.     Left lower leg: Edema present.     Comments:  Baseline, wheelchair   Lymphadenopathy:     Cervical: No cervical adenopathy.  Skin:    General: Skin is warm and dry.     Capillary Refill: Capillary refill takes less than 2 seconds.  Neurological:     General: No  focal deficit present.     Mental Status: He is alert and oriented to person, place, and time. Mental status is at baseline.  Psychiatric:        Mood and Affect: Mood normal.        Behavior: Behavior normal.        Thought Content: Thought content normal.        Judgment: Judgment normal.      No results found for any visits on 08/06/23.     Assessment & Plan:    Routine Health Maintenance and Physical Exam Discussed health promotion and safety including diet and exercise recommendations, dental health, and injury prevention. Tobacco cessation if applicable. Seat belts, sunscreen, smoke detectors, etc.    Immunization History  Administered Date(s) Administered   Influenza Split 10/06/2011, 10/15/2012   Influenza,inj,Quad PF,6+ Mos 11/01/2013, 01/05/2015, 09/03/2015, 11/13/2016, 08/14/2017, 10/01/2018, 09/01/2019, 08/26/2021, 09/26/2022   PFIZER(Purple Top)SARS-COV-2 Vaccination 02/17/2020, 03/09/2020, 10/08/2020   Tdap 12/08/2006, 08/14/2017   Zoster Recombinant(Shingrix) 08/06/2023    Health Maintenance  Topic Date Due   Colonoscopy  Never done   COVID-19 Vaccine (4 - 2023-24 season) 08/08/2022   INFLUENZA VACCINE  07/09/2023   Zoster Vaccines- Shingrix (2 of 2) 10/01/2023   DTaP/Tdap/Td (3 - Td or Tdap) 08/15/2027   Hepatitis C Screening  Completed   HIV Screening  Completed   HPV VACCINES  Aged Out        Problem List Items Addressed This Visit       Active Problems   Hyperlipidemia   Relevant Orders   Comprehensive metabolic panel   Lipid panel   Hyperglycemia   Relevant Orders   HgB A1c   Other Visit Diagnoses     Annual physical exam    -  Primary   Relevant Orders   CBC with Differential/Platelet   Comprehensive metabolic panel    Lipid panel   TSH   HgB A1c   IBC + Ferritin   Anemia, unspecified type       Relevant Orders   CBC with Differential/Platelet   IBC + Ferritin   Encounter for immunization       Relevant Orders   Varicella-zoster vaccine IM (Completed)      Return in about 3 months (around 11/06/2023) for routine follow-up.     Clayborne Dana, NP

## 2023-08-07 ENCOUNTER — Other Ambulatory Visit: Payer: Self-pay | Admitting: Neurology

## 2023-08-07 ENCOUNTER — Encounter: Payer: Self-pay | Admitting: Neurology

## 2023-08-07 LAB — CBC WITH DIFFERENTIAL/PLATELET
Basophils Absolute: 0.1 10*3/uL (ref 0.0–0.1)
Basophils Relative: 1.4 % (ref 0.0–3.0)
Eosinophils Absolute: 0.6 10*3/uL (ref 0.0–0.7)
Eosinophils Relative: 6.7 % — ABNORMAL HIGH (ref 0.0–5.0)
HCT: 40.4 % (ref 39.0–52.0)
Hemoglobin: 13.2 g/dL (ref 13.0–17.0)
Lymphocytes Relative: 34 % (ref 12.0–46.0)
Lymphs Abs: 2.8 10*3/uL (ref 0.7–4.0)
MCHC: 32.7 g/dL (ref 30.0–36.0)
MCV: 91.9 fl (ref 78.0–100.0)
Monocytes Absolute: 0.4 10*3/uL (ref 0.1–1.0)
Monocytes Relative: 5.2 % (ref 3.0–12.0)
Neutro Abs: 4.4 10*3/uL (ref 1.4–7.7)
Neutrophils Relative %: 52.7 % (ref 43.0–77.0)
Platelets: 225 10*3/uL (ref 150.0–400.0)
RBC: 4.4 Mil/uL (ref 4.22–5.81)
RDW: 14.9 % (ref 11.5–15.5)
WBC: 8.3 10*3/uL (ref 4.0–10.5)

## 2023-08-07 LAB — COMPREHENSIVE METABOLIC PANEL
ALT: 21 U/L (ref 0–53)
AST: 15 U/L (ref 0–37)
Albumin: 4.3 g/dL (ref 3.5–5.2)
Alkaline Phosphatase: 90 U/L (ref 39–117)
BUN: 13 mg/dL (ref 6–23)
CO2: 33 meq/L — ABNORMAL HIGH (ref 19–32)
Calcium: 9.2 mg/dL (ref 8.4–10.5)
Chloride: 100 meq/L (ref 96–112)
Creatinine, Ser: 1.04 mg/dL (ref 0.40–1.50)
GFR: 83.76 mL/min (ref >60.00)
Glucose, Bld: 84 mg/dL (ref 70–99)
Potassium: 4.1 meq/L (ref 3.5–5.1)
Sodium: 140 mEq/L (ref 135–145)
Total Bilirubin: 0.7 mg/dL (ref 0.2–1.2)
Total Protein: 6.7 g/dL (ref 6.0–8.3)

## 2023-08-07 LAB — LIPID PANEL
Cholesterol: 134 mg/dL (ref 0–200)
HDL: 43.2 mg/dL (ref >39.00)
LDL Cholesterol: 74 mg/dL (ref 0–99)
NonHDL: 90.4
Total CHOL/HDL Ratio: 3
Triglycerides: 84 mg/dL (ref 0.0–149.0)
VLDL: 16.8 mg/dL (ref 0.0–40.0)

## 2023-08-07 LAB — IBC + FERRITIN
Ferritin: 15.3 ng/mL — ABNORMAL LOW (ref 22.0–322.0)
Iron: 60 ug/dL (ref 42–165)
Saturation Ratios: 15.9 % — ABNORMAL LOW (ref 20.0–50.0)
TIBC: 376.6 ug/dL (ref 250.0–450.0)
Transferrin: 269 mg/dL (ref 212.0–360.0)

## 2023-08-07 LAB — TSH: TSH: 1.68 u[IU]/mL (ref 0.35–5.50)

## 2023-08-07 LAB — HEMOGLOBIN A1C: Hgb A1c MFr Bld: 5.7 % (ref 4.6–6.5)

## 2023-08-07 MED ORDER — METHYLPHENIDATE HCL 10 MG PO TABS: 10.0000 mg | ORAL_TABLET | Freq: Four times a day (QID) | ORAL | 0 refills | Status: DC

## 2023-08-07 MED ORDER — GABAPENTIN 600 MG PO TABS: 600.0000 mg | ORAL_TABLET | Freq: Four times a day (QID) | ORAL | 0 refills | Status: DC

## 2023-08-07 NOTE — Telephone Encounter (Signed)
Dr.Sater patient  Last seen on 03/12/23 Follow up scheduled on 09/22/23 Last filled on 03/12/23 #120 tablets (30 day supply) Rx pending to signed

## 2023-08-07 NOTE — Telephone Encounter (Signed)
 Last seen on 03/12/23 Follow up scheduled on 09/22/23

## 2023-08-20 ENCOUNTER — Ambulatory Visit (INDEPENDENT_AMBULATORY_CARE_PROVIDER_SITE_OTHER): Payer: 59 | Admitting: Psychology

## 2023-08-20 DIAGNOSIS — F331 Major depressive disorder, recurrent, moderate: Secondary | ICD-10-CM

## 2023-08-20 NOTE — Progress Notes (Signed)
[facility] Counselor/Therapist Progress Note  Patient ID: Keith Morrow, MRN: [id number],    Date: 08/20/2023  Time Spent: 11:00am-11:50am    50 minutes   Treatment Type: Individual Therapy  Reported Symptoms: stress  Mental Status Exam: Appearance:  Casual     Behavior: Appropriate  Motor: Normal  Speech/Language:  Normal Rate  Affect: Appropriate  Mood: normal  Thought process: normal  Thought content:   WNL  Sensory/Perceptual disturbances:   WNL  Orientation: oriented to person, place, time/date, and situation  Attention: Good  Concentration: Good  Memory: WNL  Fund of knowledge:  Good  Insight:   Good  Judgment:  Good  Impulse Control: Good   Risk Assessment: Danger to Self:  No Self-injurious Behavior: No Danger to Others: No Duty to Warn:no Physical Aggression / Violence:No  Access to Firearms a concern: No  Gang Involvement:No   Subjective: Pt present for face-to-face individual therapy via video.  Pt consents to telehealth video session and is aware of limitations and benefits of video sessions.  Location of pt: home Location of therapist: home office.   Pt talked about having [name] take over finances so that will hopefully decrease her spending.   Addressed financial issues and stress. Pt states it has been a difficult couple of weeks.  One of pt's dogs [name] has been sick and was diagnosed with an autoimmune disease that is 65% fatal so they ended up having to put [place] down this week.   Pt and family are very sad about the loss.  Helped pt process his feelings and grief.   The family is going to bury him and have a funeral service tonight.   Pt and [name] are going out of town next week for [employer] for work.  They will be driving to [facility].  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   Pt talked about getting angry when [name] was not home to help him get [name] to the vet last week.  Addressed pt's feelings and how  he communicated with [name].   Worked on self care strategies. Provided supportive therapy.  Interventions: Cognitive Behavioral Therapy and Insight-Oriented  Diagnosis:  F33.1  Plan of Care: Recommend ongoing therapy.   Plan to meet every two weeks.   Pt is progressing toward goals.    Treatment Plan  (Treatment Plan Target Date: 04/08/2024) Client Abilities/Strengths  Pt is bright, engaging, and motivated for therapy.   Client Treatment Preferences  Individual therapy.  Client Statement of Needs  Improve coping skills.  Symptoms  Depressed or irritable mood. Feelings of hopelessness, worthlessness, or inappropriate guilt. Low self-esteem.  Problems Addressed  Unipolar Depression Goals 1. Alleviate depressive symptoms and return to previous level of effective functioning. 2. Appropriately grieve the loss in order to normalize mood and to return to previously adaptive level of functioning. Objective Learn and implement behavioral strategies to overcome depression. Target Date: 2024-04-08 Frequency: Biweekly  Progress: 60 Modality: individual  Related Interventions Engage the client in "behavioral activation," increasing his/her activity level and contact with sources of reward, while identifying processes that inhibit activation.  Use behavioral techniques such as instruction, rehearsal, role-playing, role reversal, as needed, to facilitate activity in the client's daily life; reinforce success. Assist the client in developing skills that increase the likelihood of deriving pleasure from behavioral activation (e.g., assertiveness skills, developing an exercise plan, less internal/more external focus, increased social involvement); reinforce success. Objective Identify important people in life, past and  present, and describe the quality, good and poor, of those relationships. Target Date: 2024-04-08 Frequency: Biweekly  Progress: 60 Modality: individual  Related  Interventions Conduct Interpersonal Therapy beginning with the assessment of the client's "interpersonal inventory" of important past and present relationships; develop a case formulation linking depression to grief, interpersonal role disputes, role transitions, and/or interpersonal deficits). Objective Learn and implement problem-solving and decision-making skills. Target Date: 2024-04-08 Frequency: Biweekly  Progress: 60 Modality: individual  Related Interventions Conduct Problem-Solving Therapy using techniques such as psychoeducation, modeling, and role-playing to teach client problem-solving skills (i.e., defining a problem specifically, generating possible solutions, evaluating the pros and cons of each solution, selecting and implementing a plan of action, evaluating the efficacy of the plan, accepting or revising the plan); role-play application of the problem-solving skill to a real life issue. Encourage in the client the development of a positive problem orientation in which problems and solving them are viewed as a natural part of life and not something to be feared, despaired, or avoided. 3. Develop healthy interpersonal relationships that lead to the alleviation and help prevent the relapse of depression. 4. Develop healthy thinking patterns and beliefs about self, others, and the world that lead to the alleviation and help prevent the relapse of depression. 5. Recognize, accept, and cope with feelings of depression. Diagnosis F33.1  Conditions For Discharge Achievement of treatment goals and objectives   Salomon Fick, LCSW

## 2023-09-03 ENCOUNTER — Ambulatory Visit: Payer: 59 | Admitting: Psychology

## 2023-09-03 DIAGNOSIS — F331 Major depressive disorder, recurrent, moderate: Secondary | ICD-10-CM

## 2023-09-03 NOTE — Progress Notes (Signed)
[facility] Counselor/Therapist Progress Note  Patient ID: Keith Morrow, MRN: [id number],    Date: 09/03/2023  Time Spent: 11:00am-11:50am    50 minutes   Treatment Type: Individual Therapy  Reported Symptoms: stress  Mental Status Exam: Appearance:  Casual     Behavior: Appropriate  Motor: Normal  Speech/Language:  Normal Rate  Affect: Appropriate  Mood: normal  Thought process: normal  Thought content:   WNL  Sensory/Perceptual disturbances:   WNL  Orientation: oriented to person, place, time/date, and situation  Attention: Good  Concentration: Good  Memory: WNL  Fund of knowledge:  Good  Insight:   Good  Judgment:  Good  Impulse Control: Good   Risk Assessment: Danger to Self:  No Self-injurious Behavior: No Danger to Others: No Duty to Warn:no Physical Aggression / Violence:No  Access to Firearms a concern: No  Gang Involvement:No   Subjective: Pt present for face-to-face individual therapy via video.  Pt consents to telehealth video session and is aware of limitations and benefits of video sessions.  Location of pt: home Location of therapist: home office.   Pt talked about feeling a little down.  The rainy weather has affected him.  He also feels frustrated that [name] is either at work or in bed.   Pt states he has been grouchy.   Pt talked about his work trip to [place] last week.   [name] went with him and the trip went well.   They got to see [name] for a day as well.   Pt is still grieving the loss of his dog [name].  Helped pt process his feelings and grief.  The family had a [place] ceremony when they buried him.   Pt talked about work.  He feels overwhelmed with the work load.   Worked on Optician, dispensing.  Worked on self care strategies. Provided supportive therapy.  Interventions: Cognitive Behavioral Therapy and Insight-Oriented  Diagnosis:  F33.1  Plan of Care: Recommend ongoing therapy.   Plan to meet every two weeks.   Pt is  progressing toward goals.    Treatment Plan  (Treatment Plan Target Date: 04/08/2024) Client Abilities/Strengths  Pt is bright, engaging, and motivated for therapy.   Client Treatment Preferences  Individual therapy.  Client Statement of Needs  Improve coping skills.  Symptoms  Depressed or irritable mood. Feelings of hopelessness, worthlessness, or inappropriate guilt. Low self-esteem.  Problems Addressed  Unipolar Depression Goals 1. Alleviate depressive symptoms and return to previous level of effective functioning. 2. Appropriately grieve the loss in order to normalize mood and to return to previously adaptive level of functioning. Objective Learn and implement behavioral strategies to overcome depression. Target Date: 2024-04-08 Frequency: Biweekly  Progress: 60 Modality: individual  Related Interventions Engage the client in "behavioral activation," increasing his/her activity level and contact with sources of reward, while identifying processes that inhibit activation.  Use behavioral techniques such as instruction, rehearsal, role-playing, role reversal, as needed, to facilitate activity in the client's daily life; reinforce success. Assist the client in developing skills that increase the likelihood of deriving pleasure from behavioral activation (e.g., assertiveness skills, developing an exercise plan, less internal/more external focus, increased social involvement); reinforce success. Objective Identify important people in life, past and present, and describe the quality, good and poor, of those relationships. Target Date: 2024-04-08 Frequency: Biweekly  Progress: 60 Modality: individual  Related Interventions Conduct Interpersonal Therapy beginning with the assessment of the client's "interpersonal inventory" of important past and present relationships; develop a  case formulation linking depression to grief, interpersonal role disputes, role transitions, and/or interpersonal  deficits). Objective Learn and implement problem-solving and decision-making skills. Target Date: 2024-04-08 Frequency: Biweekly  Progress: 60 Modality: individual  Related Interventions Conduct Problem-Solving Therapy using techniques such as psychoeducation, modeling, and role-playing to teach client problem-solving skills (i.e., defining a problem specifically, generating possible solutions, evaluating the pros and cons of each solution, selecting and implementing a plan of action, evaluating the efficacy of the plan, accepting or revising the plan); role-play application of the problem-solving skill to a real life issue. Encourage in the client the development of a positive problem orientation in which problems and solving them are viewed as a natural part of life and not something to be feared, despaired, or avoided. 3. Develop healthy interpersonal relationships that lead to the alleviation and help prevent the relapse of depression. 4. Develop healthy thinking patterns and beliefs about self, others, and the world that lead to the alleviation and help prevent the relapse of depression. 5. Recognize, accept, and cope with feelings of depression. Diagnosis F33.1  Conditions For Discharge Achievement of treatment goals and objectives   Salomon Fick, LCSW

## 2023-09-17 ENCOUNTER — Ambulatory Visit: Payer: 59 | Admitting: Psychology

## 2023-09-17 DIAGNOSIS — F331 Major depressive disorder, recurrent, moderate: Secondary | ICD-10-CM | POA: Diagnosis not present

## 2023-09-17 NOTE — Progress Notes (Signed)
[facility] Counselor/Therapist Progress Note  Patient ID: Keith Morrow, MRN: [id number],    Date: 09/17/2023  Time Spent: 4:00pm-4:50pm    50 minutes   Treatment Type: Individual Therapy  Reported Symptoms: stress  Mental Status Exam: Appearance:  Casual     Behavior: Appropriate  Motor: Normal  Speech/Language:  Normal Rate  Affect: Appropriate  Mood: normal  Thought process: normal  Thought content:   WNL  Sensory/Perceptual disturbances:   WNL  Orientation: oriented to person, place, time/date, and situation  Attention: Good  Concentration: Good  Memory: WNL  Fund of knowledge:  Good  Insight:   Good  Judgment:  Good  Impulse Control: Good   Risk Assessment: Danger to Self:  No Self-injurious Behavior: No Danger to Others: No Duty to Warn:no Physical Aggression / Violence:No  Access to Firearms a concern: No  Gang Involvement:No   Subjective: Pt present for face-to-face individual therapy via video.  Pt consents to telehealth video session and is aware of limitations and benefits of video sessions.  Location of pt: home Location of therapist: home office.   Pt talked about work.  He feels overwhelmed with the work load.   Worked on Optician, dispensing.   Pt is also frustrated with some work dynamics.  There are so many meetings that there is not enough time to get his work done.  Pt talked about his family.   [name] is studying to take the mcat.   Pt's mother in law had to go in the hospital to have a kidney stone removed last week. Pt had to get a new car. Pt felt like the week was stressful but he is coping with it ok.   Worked on self care strategies. Provided supportive therapy.  Interventions: Cognitive Behavioral Therapy and Insight-Oriented  Diagnosis:  F33.1  Plan of Care: Recommend ongoing therapy.   Plan to meet every two weeks.   Pt is progressing toward goals.    Treatment Plan  (Treatment Plan Target Date: 04/08/2024) Client  Abilities/Strengths  Pt is bright, engaging, and motivated for therapy.   Client Treatment Preferences  Individual therapy.  Client Statement of Needs  Improve coping skills.  Symptoms  Depressed or irritable mood. Feelings of hopelessness, worthlessness, or inappropriate guilt. Low self-esteem.  Problems Addressed  Unipolar Depression Goals 1. Alleviate depressive symptoms and return to previous level of effective functioning. 2. Appropriately grieve the loss in order to normalize mood and to return to previously adaptive level of functioning. Objective Learn and implement behavioral strategies to overcome depression. Target Date: 2024-04-08 Frequency: Biweekly  Progress: 60 Modality: individual  Related Interventions Engage the client in "behavioral activation," increasing his/her activity level and contact with sources of reward, while identifying processes that inhibit activation.  Use behavioral techniques such as instruction, rehearsal, role-playing, role reversal, as needed, to facilitate activity in the client's daily life; reinforce success. Assist the client in developing skills that increase the likelihood of deriving pleasure from behavioral activation (e.g., assertiveness skills, developing an exercise plan, less internal/more external focus, increased social involvement); reinforce success. Objective Identify important people in life, past and present, and describe the quality, good and poor, of those relationships. Target Date: 2024-04-08 Frequency: Biweekly  Progress: 60 Modality: individual  Related Interventions Conduct Interpersonal Therapy beginning with the assessment of the client's "interpersonal inventory" of important past and present relationships; develop a case formulation linking depression to grief, interpersonal role disputes, role transitions, and/or interpersonal deficits). Objective Learn and implement problem-solving and  decision-making skills. Target  Date: 2024-04-08 Frequency: Biweekly  Progress: 60 Modality: individual  Related Interventions Conduct Problem-Solving Therapy using techniques such as psychoeducation, modeling, and role-playing to teach client problem-solving skills (i.e., defining a problem specifically, generating possible solutions, evaluating the pros and cons of each solution, selecting and implementing a plan of action, evaluating the efficacy of the plan, accepting or revising the plan); role-play application of the problem-solving skill to a real life issue. Encourage in the client the development of a positive problem orientation in which problems and solving them are viewed as a natural part of life and not something to be feared, despaired, or avoided. 3. Develop healthy interpersonal relationships that lead to the alleviation and help prevent the relapse of depression. 4. Develop healthy thinking patterns and beliefs about self, others, and the world that lead to the alleviation and help prevent the relapse of depression. 5. Recognize, accept, and cope with feelings of depression. Diagnosis F33.1  Conditions For Discharge Achievement of treatment goals and objectives   Salomon Fick, LCSW

## 2023-09-22 ENCOUNTER — Telehealth: Payer: Self-pay | Admitting: *Deleted

## 2023-09-22 ENCOUNTER — Ambulatory Visit (INDEPENDENT_AMBULATORY_CARE_PROVIDER_SITE_OTHER): Payer: Managed Care, Other (non HMO) | Admitting: Neurology

## 2023-09-22 ENCOUNTER — Encounter: Payer: Self-pay | Admitting: Neurology

## 2023-09-22 VITALS — BP 96/61 | HR 87 | Ht 75.0 in | Wt 302.0 lb

## 2023-09-22 DIAGNOSIS — R269 Unspecified abnormalities of gait and mobility: Secondary | ICD-10-CM | POA: Diagnosis not present

## 2023-09-22 DIAGNOSIS — G801 Spastic diplegic cerebral palsy: Secondary | ICD-10-CM

## 2023-09-22 DIAGNOSIS — Z79899 Other long term (current) drug therapy: Secondary | ICD-10-CM

## 2023-09-22 DIAGNOSIS — G35 Multiple sclerosis: Secondary | ICD-10-CM

## 2023-09-22 DIAGNOSIS — R3915 Urgency of urination: Secondary | ICD-10-CM

## 2023-09-22 DIAGNOSIS — Z95828 Presence of other vascular implants and grafts: Secondary | ICD-10-CM

## 2023-09-22 MED ORDER — GABAPENTIN 600 MG PO TABS: 600.0000 mg | ORAL_TABLET | Freq: Four times a day (QID) | ORAL | 3 refills | Status: DC

## 2023-09-22 MED ORDER — METHYLPHENIDATE HCL 10 MG PO TABS: 10.0000 mg | ORAL_TABLET | Freq: Four times a day (QID) | ORAL | 0 refills | Status: DC

## 2023-09-22 NOTE — Telephone Encounter (Signed)
Placed JCV lab in [facility] lock box for routine lab pick up. Results pending. 

## 2023-09-22 NOTE — Progress Notes (Signed)
[name]     HPI     Follow-up    Additional comments: Pt in room 10. Here for MS follow up on Tysabri. Pt reports being stable, no decline in health. Pt is fighting wth insurance for physical therapy. Pt said right had locks up he asked if he should still take the dalfamprindine.  Pt [facility] ( [facility]) and [facility]       Last edited by Aura Camps, RMA on 09/22/2023  3:16 PM.       Chief Complaint   Follow-up     HISTORY:  Keith Morrow is a 52 y.o. man with a relapsing form of multiple sclerosis who has right greater than left leg weakness and progressive difficulties with gait function.  Due to severe spasticity, a baclofen pump was placed in 2013 and replaced 09/2020.  Update 09/22/2023: He is on Tysabri and tolerates it well.   No exacerbations.  He has a baclofen pump.  He is now using [facility] to fill the pump.  He takes a bolus at nght with benefit.    Clonazepam has helped the spasticity in the arms and torso.      He cannot use a walker now.  Transferring has been more difficult.  He notes more stiffness in his right hand.    He has had a few falls, last one a few days ago when he leaned over to grab something.   He did not hit his head.   He spends most of the day in the chair.    He occasionally to exercise some with squats in the morning holding on for support.   He has an Oncologist (moves for hm and helps spasticity some).  He has hand controls on his Zenaida Niece.  He is more hesitant to go further away.      Due to slow progression of weakness and reduced coordination in the right arm/hand.  He now writes and types with the left hand and just uses the right hand to operate a mouse.    Sensation is generally the same.     Vision is fine.   Bladder function is fine on tamsulosin.  No UTI.      He gets some back and flank pain while driving.    The bottom of his feet hurt more as the day goes on.     Ritalin helps the MS related  ADD/cgnitive/fatigue issues.    He sleeps well at night.  He sleeps better with CPAP.   Mood is doing reasonably well.    He has a baclofen pump (2021) .  He now does Museum/gallery curator.  Next refill 11/14/2023.    He has patient controlled boluses that kick in in about 20 minutes.    Because he is feeling weaker, he is trying to use few boluses.  Night time spasticity is doing ok.  He takes a bolus usually in middle of night if he wakes up with some leg twitching.    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       MS history:  In 1997, he had left optic neuritis and then had numbness in both feet. MRI was consistent with MS and a lumbar puncture was also performed and CSF was consistent with MS. Initially, he was placed on Copaxone. He had difficulties with compliance and 5 years later switched to Avonex. Unfortunate, he  had difficulties tolerating Avonex and also had difficulty tolerating Rebif. In 2004, he started Tysabri but was only able to take 2 doses before was taken off the market. He restarted in 2005 and has been on Tysabri since then with the exception of a 6 month holiday. Tolerates Tysabri well. He has not had any definite exacerbations though his gait has worsened over the past few years. He is JCV antibody negative.   He had recent MRI of the brain and spine and there were no changes.      Spasticity history: He was experiencing progressive spasticity in both legs.  He got a baclofen pump in 2013.  Prior to the pump he was having severe tonic spasticity as well as frequent phasic spasms.  These improved.  Due to the spasticity, baclofen was increased in 2016 and 2017 but he became weaker.  I began to see him again and we added clonidine to the baclofen and cut his dose and added nighttime boluses.  He did better with those settings.  Imaging: MRI of the cervical spine dated 07/25/2020 and  MRI of the cervical spine from 07/05/2015.  Both  showed multiple T2 hyperintense foci within the spinal cord.  They are located posterolaterally to the right below the cervicomedullary junction, anteriorly adjacent to C3C4,  posterolaterally to the left adjacent to C4, posterior adjacent to C4-C5, both the left and the right adjacent to C4-C5, very large right focus adjacent to C5, posterior laterally to the left adjacent to C6, towards the right adjacent to C6 and posterior laterally to the right adjacent to C7-T1.  None of the foci enhance.  There did not appear to be any new lesions between 2016 and 2021.   There is a right disc protrusion causing mild to moderate right foraminal narrowing.  At C5-C6, there is a right disc protrusion but no nerve root compression  The MRI of the brain from 07/25/2020 is unchanged compared to the 12/19/2015 MRI according to the official report.  Most foci are supratentorial but somewhat noted in the pons.    There are no enhancing lesions.    PHYSICAL EXAM  Today's Vitals   09/22/23 1509  BP: 96/61  Pulse: 87  Weight: (!) 302 lb (137 kg)  Height: 6\' 3"  (1.905 m)   Body mass index is 37.75 kg/m.   General: The patient is well-developed and well-nourished and in no acute distress.  Extremities:   There are no rashes or edema.    Neurologic Exam  Mental status: The patient is alert and oriented x 3 at the time of the examination.  Good focus/attention.    Speech is normal.  CN:   Extraocular muscles are normal.  No ptosis.    Symmetric facial strength and sensation.    PE/TP midline  Motor:  Muscle bulk is normal.  tone is increased in the right arm and minimally increased in the legs.  Strength is 4+/5 in the right arm, 5/5 in the left arm, 2-2+/5 in the right leg and 2+-3/5 in the left leg .   Reduced RAM in right hand  Sensation: He had symmetric sensation to touch and vibration today.  Gait and station: He is wheelchair-bound  Reflexes: Deep tendon reflexes are trace at the left knee and trace at  the right knee.    No ankle clonus.  ASSESSMENT  Multiple sclerosis (HCC) - Plan: Stratify JCV Antibody Test ([facility]), CBC with Differential/Platelet  Spastic diplegia (HCC)  High risk medication use -  Plan: Stratify JCV Antibody Test ([facility]), CBC with Differential/Platelet  Gait disturbance  Urinary urgency  Presence of implanted infusion pump   PLAN: 1.   Continue Tysabri every 4 weeks (uses [facility]).   Check JCV antibody and CBC today.  He has been JCV negative. 2.   He will continue with the intrathecal pump.  This will be ref[facility] with [facility]: 1800 mcg/mL baclofen and 420 mcg/mL clonidine.    He has a bolus or two at night.   3.   Continue other med's   .  Refills will be sent in. 4.   Return in 6 months or sooner if there are new or worsening neurologic issues.  We can make further adjustments if needed before the next refill    Placido Hangartner A. Epimenio Foot, MD, PhD, FAAN Certified in Neurology, Clinical Neurophysiology, Sleep Medicine, Pain Medicine and Neuroimaging Director, [facility] at [facility]  [facility] [address] [place], [place] [address] [phone]

## 2023-09-23 LAB — CBC WITH DIFFERENTIAL/PLATELET
Basophils Absolute: 0.1 10*3/uL (ref 0.0–0.2)
Basos: 1 %
EOS (ABSOLUTE): 0.4 10*3/uL (ref 0.0–0.4)
Eos: 6 %
Hematocrit: 42.4 % (ref 37.5–51.0)
Hemoglobin: 13.7 g/dL (ref 13.0–17.7)
Immature Grans (Abs): 0.1 10*3/uL (ref 0.0–0.1)
Immature Granulocytes: 1 %
Lymphocytes Absolute: 2.5 10*3/uL (ref 0.7–3.1)
Lymphs: 32 %
MCH: 29.5 pg (ref 26.6–33.0)
MCHC: 32.3 g/dL (ref 31.5–35.7)
MCV: 91 fL (ref 79–97)
Monocytes Absolute: 0.6 10*3/uL (ref 0.1–0.9)
Monocytes: 8 %
Neutrophils Absolute: 4 10*3/uL (ref 1.4–7.0)
Neutrophils: 52 %
Platelets: 214 10*3/uL (ref 150–450)
RBC: 4.64 x10E6/uL (ref 4.14–5.80)
RDW: 13 % (ref 11.6–15.4)
WBC: 7.6 10*3/uL (ref 3.4–10.8)

## 2023-09-29 ENCOUNTER — Other Ambulatory Visit: Payer: Self-pay | Admitting: Family Medicine

## 2023-10-01 ENCOUNTER — Ambulatory Visit: Payer: Managed Care, Other (non HMO) | Admitting: Gastroenterology

## 2023-10-01 ENCOUNTER — Ambulatory Visit: Payer: 59 | Admitting: Psychology

## 2023-10-01 ENCOUNTER — Encounter: Payer: Self-pay | Admitting: Gastroenterology

## 2023-10-01 DIAGNOSIS — F331 Major depressive disorder, recurrent, moderate: Secondary | ICD-10-CM

## 2023-10-01 DIAGNOSIS — K219 Gastro-esophageal reflux disease without esophagitis: Secondary | ICD-10-CM

## 2023-10-01 MED ORDER — PANTOPRAZOLE SODIUM 40 MG PO TBEC
40.0000 mg | DELAYED_RELEASE_TABLET | Freq: Two times a day (BID) | ORAL | 2 refills | Status: DC
Start: 2023-10-01 — End: 2023-12-03

## 2023-10-01 NOTE — Progress Notes (Signed)
[facility] Counselor/Therapist Progress Note  Patient ID: EADEN CRADDICK, MRN: [id number],    Date: 10/01/2023  Time Spent: 11:00am-11:50am    50 minutes   Treatment Type: Individual Therapy  Reported Symptoms: stress  Mental Status Exam: Appearance:  Casual     Behavior: Appropriate  Motor: Normal  Speech/Language:  Normal Rate  Affect: Appropriate  Mood: normal  Thought process: normal  Thought content:   WNL  Sensory/Perceptual disturbances:   WNL  Orientation: oriented to person, place, time/date, and situation  Attention: Good  Concentration: Good  Memory: WNL  Fund of knowledge:  Good  Insight:   Good  Judgment:  Good  Impulse Control: Good   Risk Assessment: Danger to Self:  No Self-injurious Behavior: No Danger to Others: No Duty to Warn:no Physical Aggression / Violence:No  Access to Firearms a concern: No  Gang Involvement:No   Subjective: Pt present for face-to-face individual therapy via video.  Pt consents to telehealth video session and is aware of limitations and benefits of video sessions.  Location of pt: home Location of therapist: home office.   Pt talked about work.  Pt states it has been a very busy week.   He has had long days at work.  He feels overwhelmed with the work load.   Worked on Optician, dispensing.   Pt is also frustrated with some work dynamics. Pt had a meeting with a man from corporate who told pt he is an inspiration bc of all he does despite his MS.   Pt talked about his health.   He sees the GI doctor today to address his hernia issues.    Pt talked about trying to plan what to do at Thanksgiving.  They will probably stay home and have a zoom time with his extended family.      Worked on self care strategies. Provided supportive therapy.  Interventions: Cognitive Behavioral Therapy and Insight-Oriented  Diagnosis:  F33.1  Plan of Care: Recommend ongoing therapy.   Plan to meet every two weeks.   Pt is  progressing toward goals.    Treatment Plan  (Treatment Plan Target Date: 04/08/2024) Client Abilities/Strengths  Pt is bright, engaging, and motivated for therapy.   Client Treatment Preferences  Individual therapy.  Client Statement of Needs  Improve coping skills.  Symptoms  Depressed or irritable mood. Feelings of hopelessness, worthlessness, or inappropriate guilt. Low self-esteem.  Problems Addressed  Unipolar Depression Goals 1. Alleviate depressive symptoms and return to previous level of effective functioning. 2. Appropriately grieve the loss in order to normalize mood and to return to previously adaptive level of functioning. Objective Learn and implement behavioral strategies to overcome depression. Target Date: 2024-04-08 Frequency: Biweekly  Progress: 60 Modality: individual  Related Interventions Engage the client in "behavioral activation," increasing his/her activity level and contact with sources of reward, while identifying processes that inhibit activation.  Use behavioral techniques such as instruction, rehearsal, role-playing, role reversal, as needed, to facilitate activity in the client's daily life; reinforce success. Assist the client in developing skills that increase the likelihood of deriving pleasure from behavioral activation (e.g., assertiveness skills, developing an exercise plan, less internal/more external focus, increased social involvement); reinforce success. Objective Identify important people in life, past and present, and describe the quality, good and poor, of those relationships. Target Date: 2024-04-08 Frequency: Biweekly  Progress: 60 Modality: individual  Related Interventions Conduct Interpersonal Therapy beginning with the assessment of the client's "interpersonal inventory" of important past and present relationships;  develop a case formulation linking depression to grief, interpersonal role disputes, role transitions, and/or interpersonal  deficits). Objective Learn and implement problem-solving and decision-making skills. Target Date: 2024-04-08 Frequency: Biweekly  Progress: 60 Modality: individual  Related Interventions Conduct Problem-Solving Therapy using techniques such as psychoeducation, modeling, and role-playing to teach client problem-solving skills (i.e., defining a problem specifically, generating possible solutions, evaluating the pros and cons of each solution, selecting and implementing a plan of action, evaluating the efficacy of the plan, accepting or revising the plan); role-play application of the problem-solving skill to a real life issue. Encourage in the client the development of a positive problem orientation in which problems and solving them are viewed as a natural part of life and not something to be feared, despaired, or avoided. 3. Develop healthy interpersonal relationships that lead to the alleviation and help prevent the relapse of depression. 4. Develop healthy thinking patterns and beliefs about self, others, and the world that lead to the alleviation and help prevent the relapse of depression. 5. Recognize, accept, and cope with feelings of depression. Diagnosis F33.1  Conditions For Discharge Achievement of treatment goals and objectives   Salomon Fick, LCSW

## 2023-10-01 NOTE — Patient Instructions (Signed)
We have sent the following medications to your pharmacy for you to pick up at your convenience: Protonix  _______________________________________________________  If your blood pressure at your visit was 140/90 or greater, please contact your primary care physician to follow up on this.  _______________________________________________________  If you are age 50 or older, your body mass index should be between 23-30. Your Body mass index is 37.5 kg/m. If this is out of the aforementioned range listed, please consider follow up with your Primary Care Provider.  If you are age 66 or younger, your body mass index should be between 19-25. Your Body mass index is 37.5 kg/m. If this is out of the aformentioned range listed, please consider follow up with your Primary Care Provider.   ________________________________________________________  The [facility] providers would like to encourage you to use [facility] to communicate with providers for non-urgent requests or questions.  Due to long hold times on the telephone, sending your provider a message by [facility] may be a faster and more efficient way to get a response.  Please allow 48 business hours for a response.  Please remember that this is for non-urgent requests.  _______________________________________________________ It was a pleasure to see you today!  Thank you for trusting me with your gastrointestinal care!

## 2023-10-01 NOTE — Progress Notes (Signed)
[facility] Consult Note:  History: Keith Morrow 10/01/2023  Referring provider: [name], MD  Reason for consult/chief complaint: Gastroesophageal Reflux (Pt states he has a lot of heart Brun. He  has a question about if his hi tal hernia is the reason for his heart burn.) and Medication Refill (Pt state he need refills on his medication Protonix.)   Subjective  HPI: I saw Keith Morrow in 2018 for chronic GERD symptoms worked up with upper endoscopy that was normal except for small hiatal hernia.  I then saw him in 2019 after an episode of anorectal pain and bleeding that had occurred from acute on chronic constipation.  He has struggled with constipation due to his MS.  No procedures performed at that time. __________   Keith Morrow is here to discuss his reflux symptoms and hiatal hernia.  He had an ED visit several months ago for chest pain and a CT angiogram noted the hiatal hernia.  He had not recall that being present on the 2018 upper endoscopy and wonder whether it may be contributing to his reflux symptoms.  He is generally taking either omeprazole or pantoprazole (which ever he had a prescription for) once daily, will would typically need an H2 blocker in the evening as well.  With that, he has generally had good control of heartburn unless he has certain offending foods like pizza, lemonade or carbonated drinks.  He denies dysphagia or odynophagia.  He is not describing episodes of aspiration, and he sleeps with head of bed elevated.  He is also in a wheelchair most of the time due to his MS.  He finds that he hunches over with work and computer activities and thinks this could be contributing to reflux as well.  I reassured him that does not appear to have affected his hiatal hernia since it is still small on recent CT angiogram (images reviewed during the visit). He denies dysphagia or odynophagia  ROS:  Review of Systems  Constitutional:  Negative for appetite  change and unexpected weight change.  HENT:  Negative for mouth sores and voice change.   Eyes:  Negative for pain and redness.  Respiratory:  Negative for cough and shortness of breath.   Cardiovascular:  Negative for chest pain and palpitations.  Genitourinary:  Negative for dysuria and hematuria.  Musculoskeletal:  Negative for arthralgias and myalgias.       Muscular weakness from his MS  Skin:  Negative for pallor and rash.  Allergic/Immunologic: Positive for environmental allergies.  Neurological:  Negative for weakness and headaches.  Hematological:  Negative for adenopathy.  Psychiatric/Behavioral:         Anxiety     Past Medical History: Past Medical History:  Diagnosis Date   Acute bronchitis 05/09/2013   Allergy    Bronchitis, acute 12/10/2011   Candidal skin infection 05/05/2017   Chicken pox as a child   Constipation 02/20/2017   Coronary atherosclerosis of native coronary artery    a. NSTEMI 04/20/11: tx with Promus DES to LAD; bifurcation lesion with oDx 90% tx with POBA; b. staged PCI of right post. AV Branch with Promus DES; c. 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   Visual changes 11/13/2016     Past Surgical History: Past Surgical History:  Procedure Laterality Date   CORONARY ANGIOPLASTY WITH STENT PLACEMENT     2012   PAIN PUMP IMPLANTATION N/A 08/29/2014   Procedure: Baclofen pump placement;   Surgeon: Maeola Harman, MD;  Location: [facility];  Service: Neurosurgery;  Laterality: N/A;  Baclofen pump placement   PAIN PUMP IMPLANTATION Right 09/20/2020   Procedure: Right Baclofen pump replacement;  Surgeon: Maeola Harman, MD;  Location: [facility] OR;  Service: Neurosurgery;  Laterality: Right;  Right Baclofen pump replacement   PROGRAMABLE BACLOFEN PUMP REVISION  08/29/14   right wrist surgery     CTR   VASCULAR SURGERY     vericose vein in right femoral area   VASECTOMY     WISDOM TOOTH EXTRACTION       Family History: Family History  Problem Relation Age of Onset   Heart attack Mother 58   Thyroid disease Mother    Heart disease Mother    Thyroid disease Sister    Thyroid disease Brother    Heart disease Maternal Grandmother    Hypertension Maternal Grandmother    Hyperlipidemia Maternal Grandmother    Heart disease Maternal Grandfather    Hypertension Maternal Grandfather    Hyperlipidemia Maternal Grandfather    Stroke Paternal Grandfather    Thyroid disease Sister    Heart disease Father        2 stents   Coronary artery disease Other        questionable in father    Social History: Social History   Socioeconomic History   Marital status: Married    Spouse name: Not on file   Number of children: 3   Years of education: Not on file   Highest education level: Bachelor's degree (e.g., BA, AB, BS)  Occupational History   Not on file  Tobacco Use   Smoking status: Never   Smokeless tobacco: Never  Vaping Use   Vaping status: Never Used  Substance and Sexual Activity   Alcohol use: Yes    Comment: socially   Drug use: No   Sexual activity: Yes    Partners: Female    Comment: 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, works Engineer, maintenance, no dietary restrictions  Other Topics Concern   Not on file  Social History Narrative   Not on file   Social Determinants of Health   Financial Resource Strain: Low Risk  (04/12/2023)   Overall Financial Resource Strain  (CARDIA)    Difficulty of Paying Living Expenses: Not hard at all  Food Insecurity: No Food Insecurity (04/12/2023)   Hunger Vital Sign    Worried About Running Out of Food in the Last Year: Never true    Ran Out of Food in the Last Year: Never true  Transportation Needs: No Transportation Needs (04/12/2023)   PRAPARE - Administrator, Civil Service (Medical): No    Lack of Transportation (Non-Medical): No  Physical Activity: Unknown (04/12/2023)   Exercise Vital Sign    Days of Exercise per Week: 0 days  Minutes of Exercise per Session: Not on file  Stress: Stress Concern Present (04/12/2023)   [employer] of Occupational Health - Occupational Stress Questionnaire    Feeling of Stress : Rather much  Social Connections: Moderately Isolated (04/12/2023)   Social Connection and Isolation Panel [NHANES]    Frequency of Communication with Friends and Family: More than three times a week    Frequency of Social Gatherings with Friends and Family: More than three times a week    Attends Religious Services: Never    Database administrator or Organizations: No    Attends Engineer, structural: Not on file    Marital Status: Married    Allergies: No Known Allergies  Outpatient Meds: Current Outpatient Medications  Medication Sig Dispense Refill   aspirin EC 81 MG tablet Take 81 mg by mouth daily.     atorvastatin (LIPITOR) 40 MG tablet TAKE 1 TABLET DAILY AT 6 P.M. 30 tablet 0   BACLOFEN IT 1 Dose by Intrathecal route as directed.      busPIRone (BUSPAR) 10 MG tablet Take 1 tablet (10 mg total) by mouth in the morning, at noon, in the evening, and at bedtime. 360 tablet 3   cetirizine (ZYRTEC) 10 MG tablet Take 10 mg by mouth daily as needed for allergies.     clonazePAM (KLONOPIN) 1 MG tablet Take 1 tablet (1 mg total) by mouth 4 (four) times daily. 360 tablet 1   Clonidine HCl POWD by Does not apply route. 720mg /ml IT Baclofen. Total dose 157.89mg /day. Last pump refill  06/02/16     clotrimazole-betamethasone (LOTRISONE) cream Apply 1 application topically 2 (two) times daily as needed (anti-fungal). 30 g 2   cyclobenzaprine (FLEXERIL) 10 MG tablet TAKE 1 TABLET BY MOUTH THREE TIMES A DAY AS NEEDED FOR MUSCLE SPASMS 90 tablet 11   CYMBALTA 60 MG capsule TAKE 1 CAPSULE TWICE A DAY 180 capsule 3   dalfampridine 10 MG TB12 TAKE 1 TABLET EVERY 12 HOURS 180 tablet 1   famotidine (PEPCID) 40 MG tablet TAKE 1 TABLET DAILY AT BEDTIME 90 tablet 3   Fe Fum-FA-B Cmp-C-Zn-Mg-Mn-Cu (HEMOCYTE-PLUS) 106-1 MG TABS Take time daily. 30 tablet 3   furosemide (LASIX) 20 MG tablet TAKE 1 TABLET (20 MG TOTAL) BY MOUTH DAILY X 5 DAYS, THEN AS NEEDED FOR FOR SHORTNESS OF BREATH/WEIGHT GAIN>3#, PEDAL EDEMA AND CAN TAKE A SECOND TAB DAILY AS NEEDED 180 tablet 0   gabapentin (NEURONTIN) 600 MG tablet Take 1 tablet (600 mg total) by mouth 4 (four) times daily. 360 tablet 3   ibuprofen (ADVIL,MOTRIN) 200 MG tablet Take 200 mg by mouth every 6 (six) hours as needed for moderate pain.     ketoconazole (NIZORAL) 2 % shampoo APPLY TOPICALLY 2 TIMES A WEEK 120 mL 5   levothyroxine (SYNTHROID) 137 MCG tablet Take 1 tablet (137 mcg total) by mouth daily before breakfast. 90 tablet 1   methylphenidate (RITALIN) 10 MG tablet Take 1 tablet (10 mg total) by mouth 4 (four) times daily. 120 tablet 0   mupirocin ointment (BACTROBAN) 2 % Apply 1 Application topically 2 (two) times daily. 22 g 0   natalizumab (TYSABRI) 300 MG/15ML injection Inject 300 mg into the vein every 28 (twenty-eight) days.      nitroGLYCERIN (NITROSTAT) 0.4 MG SL tablet PLACE 1 TABLET UNDER THE TOUNGE EVERY 5 MINUTES AS NEEDED FOR CHEST PAIN (X 3 DOSES) 25 tablet 6   nystatin cream (MYCOSTATIN) Apply 1 application  topically as directed.  30 g 2   olmesartan-hydrochlorothiazide (BENICAR HCT) 20-12.5 MG tablet TAKE 1 TABLET DAILY 90 tablet 3   potassium chloride SA (KLOR-CON M20) 20 MEQ tablet TAKE 1 TABLET BY MOUTH EVERY DAY AS NEEDED  WHEN TAKING A SECOND LASIX TAB ANY GIVEN DAY 90 tablet 3   sucralfate (CARAFATE) 1 g tablet Take 1 tablet (1 g total) by mouth 4 (four) times daily. 360 tablet 1   sucralfate (CARAFATE) 1 GM/10ML suspension Take 10 mLs (1 g total) by mouth 4 (four) times daily -  with meals and at bedtime. Take liquid or tablets but not both 420 mL 3   tamsulosin (FLOMAX) 0.4 MG CAPS capsule TAKE 1 CAPSULE DAILY 90 capsule 3   pantoprazole (PROTONIX) 40 MG tablet Take 1 tablet (40 mg total) by mouth 2 (two) times daily. 60 tablet 2   No current facility-administered medications for this visit.      ___________________________________________________________________ Objective   Exam:  BP 112/68   Pulse 100   Ht 6\' 3"  (1.905 m)   Wt 300 lb (136.1 kg)   BMI 37.50 kg/m  Wt Readings from Last 3 Encounters:  10/01/23 300 lb (136.1 kg)  09/22/23 (!) 302 lb (137 kg)  08/06/23 298 lb (135.2 kg)    Pleasant and conversational, well-appearing. No additional exam.  Entire visit spent in review of records, symptoms and plan. In a motorized wheelchair  Labs:     Latest Ref Rng & Units 09/22/2023    3:59 PM 08/06/2023    2:05 PM 04/21/2023    1:40 PM  CBC  WBC 3.4 - 10.8 x10E3/uL 7.6  8.3  8.4   Hemoglobin 13.0 - 17.7 g/dL 16.1  09.6  04.5   Hematocrit 37.5 - 51.0 % 42.4  40.4  40.7   Platelets 150 - 450 x10E3/uL 214  225.0  197       Latest Ref Rng & Units 08/06/2023    2:05 PM 04/21/2023    1:40 PM 04/13/2023    2:38 PM  CMP  Glucose 70 - 99 mg/dL 84  409  88   BUN 6 - 23 mg/dL 13  21  19    Creatinine 0.40 - 1.50 mg/dL 8.11  9.14  7.82   Sodium 135 - 145 mEq/L 140  138  140   Potassium 3.5 - 5.1 mEq/L 4.1  3.8  4.0   Chloride 96 - 112 mEq/L 100  101  100   CO2 19 - 32 mEq/L 33  27  32   Calcium 8.4 - 10.5 mg/dL 9.2  9.1  8.9   Total Protein 6.0 - 8.3 g/dL 6.7   6.4   Total Bilirubin 0.2 - 1.2 mg/dL 0.7   0.5   Alkaline Phos 39 - 117 U/L 90   82   AST 0 - 37 U/L 15   16   ALT 0 - 53 U/L 21    22      Radiologic Studies:  CLINICAL DATA:  High clinical suspicion for PE   EXAM: CT ANGIOGRAPHY CHEST WITH CONTRAST   TECHNIQUE: Multidetector CT imaging of the chest was performed using the standard protocol during bolus administration of intravenous contrast. Multiplanar CT image reconstructions and MIPs were obtained to evaluate the vascular anatomy.   RADIATION DOSE REDUCTION: This exam was performed according to the departmental dose-optimization program which includes automated exposure control, adjustment of the mA and/or kV according to patient size and/or use of iterative reconstruction technique.  CONTRAST:  75mL OMNIPAQUE IOHEXOL 350 MG/ML SOLN   COMPARISON:  Previous studies including the chest radiograph done earlier today   FINDINGS: Cardiovascular: There is homogeneous enhancement in central pulmonary artery branches. Evaluation of small peripheral branches is limited by motion artifacts and infiltrates. Extensive coronary artery calcifications are seen. Contrast density in thoracic aorta is less than optimal. As far as seen, there is no demonstrable intimal flap.   Mediastinum/Nodes: No significant lymphadenopathy is seen.   Lungs/Pleura: Increased markings are seen in both lower lung fields. Minimal bilateral pleural effusions are seen. There is no pneumothorax.   Upper Abdomen: Small hiatal hernia is seen. There is frothy density intraluminal thoracic esophagus suggesting gastroesophageal reflux.   Musculoskeletal: Unremarkable.   Review of the MIP images confirms the above findings.   IMPRESSION: There is no evidence of pulmonary artery embolism. Increased markings are seen in the lower lung fields suggesting atelectasis/pneumonia. Minimal bilateral pleural effusions are seen.   Small hiatal hernia. Gastroesophageal reflux. Coronary artery disease.     Electronically Signed   By: Ernie Avena M.D.   On: 04/21/2023 17:05     Assessment: Encounter Diagnosis  Name Primary?   Gastroesophageal reflux disease without esophagitis     Longstanding symptoms, generally good control on current treatment but he has some breakthrough with certain food triggers.  Denies dysphagia or odynophagia or anything sounding like aspiration.  His hiatal hernia is small and I would not advocate surgical treatment for it at this point. I am hopeful we can keep his reflux under good control with diet and lifestyle measures as well as acid suppression.  To that end, I am increasing his pantoprazole to 40 mg twice daily. If he still has significant breakthrough symptoms, and particularly if he were having regurgitation and things that sound like aspiration, then he would need an upper endoscopy for repeat evaluation and then consideration of TIF or fundoplication.  I am not yet sure the technical feasibility or durability of those interventions with his abdominal anatomy and usual wheelchair positioning We also revisited the possibility of colorectal cancer screening, but he does not feel that he could manage a bowel preparation.  I am happy to see him back to reevaluate this as the need arises.  Thank you for the courtesy of this consult.  Please call me with any questions or concerns.  Keith Morrow  CC: Referring provider noted above

## 2023-10-09 ENCOUNTER — Encounter: Payer: Self-pay | Admitting: Family Medicine

## 2023-10-11 ENCOUNTER — Encounter: Payer: Self-pay | Admitting: Family Medicine

## 2023-10-12 ENCOUNTER — Other Ambulatory Visit: Payer: Self-pay | Admitting: Family Medicine

## 2023-10-12 DIAGNOSIS — E039 Hypothyroidism, unspecified: Secondary | ICD-10-CM

## 2023-10-12 MED ORDER — CYMBALTA 60 MG PO CPEP: 60.0000 mg | ORAL_CAPSULE | Freq: Two times a day (BID) | ORAL | 3 refills | Status: DC

## 2023-10-14 ENCOUNTER — Other Ambulatory Visit: Payer: Self-pay | Admitting: Family Medicine

## 2023-10-15 ENCOUNTER — Ambulatory Visit: Payer: 59 | Admitting: Psychology

## 2023-10-15 DIAGNOSIS — F331 Major depressive disorder, recurrent, moderate: Secondary | ICD-10-CM

## 2023-10-15 NOTE — Progress Notes (Signed)
[facility] Counselor/Therapist Progress Note  Patient ID: AZEL GUMINA, MRN: [id number],    Date: 10/15/2023  Time Spent: 10:00am-10:50am    50 minutes   Treatment Type: Individual Therapy  Reported Symptoms: stress  Mental Status Exam: Appearance:  Casual     Behavior: Appropriate  Motor: Normal  Speech/Language:  Normal Rate  Affect: Appropriate  Mood: normal  Thought process: normal  Thought content:   WNL  Sensory/Perceptual disturbances:   WNL  Orientation: oriented to person, place, time/date, and situation  Attention: Good  Concentration: Good  Memory: WNL  Fund of knowledge:  Good  Insight:   Good  Judgment:  Good  Impulse Control: Good   Risk Assessment: Danger to Self:  No Self-injurious Behavior: No Danger to Others: No Duty to Warn:no Physical Aggression / Violence:No  Access to Firearms a concern: No  Gang Involvement:No   Subjective: Pt present for face-to-face individual therapy via video.  Pt consents to telehealth video session and is aware of limitations and benefits of video sessions.  Location of pt: home Location of therapist: home office.   Pt talked about work.  Pt states his job has been stressful bc there are many things that have to get done before the end of the year.   Worked on [employer], dispensing.    Pt talked about his health.  He has not been in as much pain.  His mood has been good since he has been feeling ok physically regarding pain.    Pt talked about his MS.  He is having more issues with his hands.  He tends to drop things more.  This is frustrating and worrysome to pt.  Worked on self care strategies. Provided supportive therapy.  Interventions: Cognitive Behavioral Therapy and Insight-Oriented  Diagnosis:  F33.1  Plan of Care: Recommend ongoing therapy.   Plan to meet every two weeks.   Pt is progressing toward goals.    Treatment Plan  (Treatment Plan Target Date: 04/08/2024) Client Abilities/Strengths   Pt is bright, engaging, and motivated for therapy.   Client Treatment Preferences  Individual therapy.  Client Statement of Needs  Improve coping skills.  Symptoms  Depressed or irritable mood. Feelings of hopelessness, worthlessness, or inappropriate guilt. Low self-esteem.  Problems Addressed  Unipolar Depression Goals 1. Alleviate depressive symptoms and return to previous level of effective functioning. 2. Appropriately grieve the loss in order to normalize mood and to return to previously adaptive level of functioning. Objective Learn and implement behavioral strategies to overcome depression. Target Date: 2024-04-08 Frequency: Biweekly  Progress: 60 Modality: individual  Related Interventions Engage the client in "behavioral activation," increasing his/her activity level and contact with sources of reward, while identifying processes that inhibit activation.  Use behavioral techniques such as instruction, rehearsal, role-playing, role reversal, as needed, to facilitate activity in the client's daily life; reinforce success. Assist the client in developing skills that increase the likelihood of deriving pleasure from behavioral activation (e.g., assertiveness skills, developing an exercise plan, less internal/more external focus, increased social involvement); reinforce success. Objective Identify important people in life, past and present, and describe the quality, good and poor, of those relationships. Target Date: 2024-04-08 Frequency: Biweekly  Progress: 60 Modality: individual  Related Interventions Conduct Interpersonal Therapy beginning with the assessment of the client's "interpersonal inventory" of important past and present relationships; develop a case formulation linking depression to grief, interpersonal role disputes, role transitions, and/or interpersonal deficits). Objective Learn and implement problem-solving and decision-making skills. Target Date:  2024-04-08  Frequency: Biweekly  Progress: 60 Modality: individual  Related Interventions Conduct Problem-Solving Therapy using techniques such as psychoeducation, modeling, and role-playing to teach client problem-solving skills (i.e., defining a problem specifically, generating possible solutions, evaluating the pros and cons of each solution, selecting and implementing a plan of action, evaluating the efficacy of the plan, accepting or revising the plan); role-play application of the problem-solving skill to a real life issue. Encourage in the client the development of a positive problem orientation in which problems and solving them are viewed as a natural part of life and not something to be feared, despaired, or avoided. 3. Develop healthy interpersonal relationships that lead to the alleviation and help prevent the relapse of depression. 4. Develop healthy thinking patterns and beliefs about self, others, and the world that lead to the alleviation and help prevent the relapse of depression. 5. Recognize, accept, and cope with feelings of depression. Diagnosis F33.1  Conditions For Discharge Achievement of treatment goals and objectives   Salomon Fick, LCSW

## 2023-10-16 NOTE — Telephone Encounter (Signed)
I called Keith Morrow to see if he was able to obtain his results and he was not able to find them. He said this was done about 2 to 3 years ago. I looked through all the encounters during the time time frame he gave me, and couldn't find anything. He thought you might be able to find it in your records.

## 2023-10-20 ENCOUNTER — Other Ambulatory Visit: Payer: Self-pay

## 2023-10-20 MED ORDER — ATORVASTATIN CALCIUM 40 MG PO TABS: 40.0000 mg | ORAL_TABLET | Freq: Every day | ORAL | 1 refills | Status: DC

## 2023-10-21 NOTE — Telephone Encounter (Signed)
Called and spoke with patient yesterday to request name of provider who did the testing. He said it was done by Dr. Donell Sievert Apr 28, 2019. I looked through the encounters and didn't see it. I looked the provider up on Google and it was for behavioral medicine

## 2023-10-23 ENCOUNTER — Ambulatory Visit (INDEPENDENT_AMBULATORY_CARE_PROVIDER_SITE_OTHER): Payer: Managed Care, Other (non HMO) | Admitting: Podiatry

## 2023-10-23 ENCOUNTER — Encounter: Payer: Self-pay | Admitting: Podiatry

## 2023-10-23 DIAGNOSIS — L600 Ingrowing nail: Secondary | ICD-10-CM | POA: Diagnosis not present

## 2023-10-23 NOTE — Progress Notes (Signed)
Subjective:  Patient ID: Keith Morrow, male    DOB: 26-Mar-1971,   MRN: [id number]  Chief Complaint  Patient presents with   Ingrown Toenail    Pt presents for a painful ingrown toenail great toe on the left.    52 y.o. male presents for concern of ingrown toenail on the left great toe that has been bothering him for some time now. Relates it has been keeping him up at night. He has had the right great toe taken care of in the past and hoping to have the left done today . Denies any other pedal complaints. Denies n/v/f/c.   Past Medical History:  Diagnosis Date   Acute bronchitis 05/09/2013   Allergy    Bronchitis, acute 12/10/2011   Candidal skin infection 05/05/2017   Chicken pox as a child   Constipation 02/20/2017   Coronary atherosclerosis of native coronary artery    a. NSTEMI 04/20/11: tx with Promus DES to LAD; bifurcation lesion with oDx 90% tx with POBA; b. staged PCI of right post. AV Branch with Promus DES; c. 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 CFT <3 seconds. Normal hair growth on digits. No edema.  Skin. No lacerations or abrasions bilateral feet. Incurvation of bilateral borders of left great toe with mild erythema noted to medial border. Tender bilateral.  Hyperkeratotic lesion noted to lateral fifth digit on  left.  Musculoskeletal: MMT 4/5 bilateral lower extremities in DF, PF, Inversion and Eversion. Deceased ROM in DF of ankle joint. Wheel chair bound Neurological: Sensation intact to light touch.   Assessment:   1. Ingrown left greater toenail      Plan:  Patient was evaluated and treated and all questions answered. Discussed ingrown toenails etiology and treatment options including procedure for removal vs conservative care.  -Hyperkeratotic lesion on left fifth toe debrided without incident.  Patient requesting removal of ingrown nail today. Procedure below.  Discussed procedure and post procedure care and patient expressed understanding.  Will follow-up in 2 weeks for nail check or sooner if any problems arise.    Procedure:  Procedure: partial Nail Avulsion of left hallux bilateral nail border.  Surgeon: Louann Sjogren, DPM  Pre-op Dx: Ingrown toenail with and without infection Post-op: Same  Place of Surgery: Office exam room.  Indications for surgery: Painful and ingrown toenail.    The patient is requesting removal of nail with  chemical matrixectomy. Risks and complications were discussed with the patient for which they understand and written consent was obtained. Under sterile conditions a total of 3 mL of  1% lidocaine plain was infiltrated in a hallux block fashion. Once anesthetized, the skin was prepped in sterile fashion. A tourniquet was then applied. Next the  bilateral aspect of hallux nail border was then sharply excised making sure to remove the entire offending nail border.  Next phenol was then applied under standard conditions to permanently destroy the matrix and copiously irrigated. Silvadene was applied. A dry sterile dressing was applied. After application of the dressing the tourniquet was removed and there is found to be an immediate capillary refill time to the digit. The patient tolerated the procedure well without any complications. Post procedure instructions  were discussed the patient for which he verbally understood. Follow-up in two weeks for nail check or sooner if any problems are to arise. Discussed signs/symptoms of infection and directed to call the office immediately should any occur or go directly to the emergency room. In the meantime, encouraged to call the office with any questions, concerns, changes symptoms.   Louann Sjogren, DPM

## 2023-10-23 NOTE — Patient Instructions (Signed)

## 2023-10-25 NOTE — Assessment & Plan Note (Signed)
hgba1c acceptable, minimize simple carbs. Increase exercise as tolerated.  

## 2023-10-25 NOTE — Assessment & Plan Note (Signed)
Well controlled, no changes to meds. Encouraged heart healthy diet such as the DASH diet and exercise as tolerated.  °

## 2023-10-25 NOTE — Assessment & Plan Note (Signed)
Encourage heart healthy diet such as MIND or DASH diet, increase exercise, avoid trans fats, simple carbohydrates and processed foods, consider a krill or fish or flaxseed oil cap daily. Tolerating Atorvastatin 

## 2023-10-25 NOTE — Assessment & Plan Note (Signed)
Continues to follow with behavioral health. No change in meds

## 2023-10-25 NOTE — Assessment & Plan Note (Signed)
On Levothyroxine, continue to monitor 

## 2023-10-26 NOTE — Telephone Encounter (Signed)
Patient has an Office visit with [name] Tuesday 11/19. Per provider's note a request from the office has been sent to retain patient record

## 2023-10-27 ENCOUNTER — Other Ambulatory Visit (HOSPITAL_BASED_OUTPATIENT_CLINIC_OR_DEPARTMENT_OTHER): Payer: Self-pay

## 2023-10-27 ENCOUNTER — Encounter: Payer: Self-pay | Admitting: Family Medicine

## 2023-10-27 ENCOUNTER — Telehealth: Payer: Self-pay

## 2023-10-27 ENCOUNTER — Ambulatory Visit (INDEPENDENT_AMBULATORY_CARE_PROVIDER_SITE_OTHER): Payer: Managed Care, Other (non HMO) | Admitting: Family Medicine

## 2023-10-27 VITALS — BP 110/61 | HR 92 | Temp 98.3°F | Resp 18 | Wt 314.4 lb

## 2023-10-27 DIAGNOSIS — F418 Other specified anxiety disorders: Secondary | ICD-10-CM | POA: Diagnosis not present

## 2023-10-27 DIAGNOSIS — Z23 Encounter for immunization: Secondary | ICD-10-CM

## 2023-10-27 DIAGNOSIS — E038 Other specified hypothyroidism: Secondary | ICD-10-CM

## 2023-10-27 DIAGNOSIS — I10 Essential (primary) hypertension: Secondary | ICD-10-CM

## 2023-10-27 DIAGNOSIS — Z1211 Encounter for screening for malignant neoplasm of colon: Secondary | ICD-10-CM

## 2023-10-27 DIAGNOSIS — E782 Mixed hyperlipidemia: Secondary | ICD-10-CM

## 2023-10-27 DIAGNOSIS — R739 Hyperglycemia, unspecified: Secondary | ICD-10-CM | POA: Diagnosis not present

## 2023-10-27 MED ORDER — WEGOVY 0.25 MG/0.5ML ~~LOC~~ SOAJ
0.2500 mg | SUBCUTANEOUS | 1 refills | Status: DC
  Filled 2023-10-27: qty 2, 28d supply, fill #0

## 2023-10-27 MED ORDER — COMIRNATY 30 MCG/0.3ML IM SUSY
0.3000 mL | PREFILLED_SYRINGE | Freq: Once | INTRAMUSCULAR | 0 refills | Status: AC
  Filled 2023-10-27: qty 0.3, 1d supply, fill #0

## 2023-10-27 NOTE — Telephone Encounter (Signed)
Please start PA for Wegovy.

## 2023-10-27 NOTE — Progress Notes (Signed)
Subjective:    Patient ID: Keith Morrow, male    DOB: 06/22/1971, 52 y.o.   MRN: [id number]  Chief Complaint  Patient presents with  . Follow-up    HPI Discussed the use of AI scribe software for clinical note transcription with the patient, who gave verbal consent to proceed.  History of Present Illness   The patient, with a history of obesity, hypertension, and multiple sclerosis, presents for a routine follow-up. The patient reports struggling with weight management and expresses interest in starting [facility], a medication for appetite suppression and weight loss. The patient has been walking her dog for exercise but finds it challenging to control his appetite. The patient also reports difficulty swallowing his iron supplement due to its size and the timing of administration with his antacid medication, Protonix. The patient is due for his second shingles vaccine and a flu shot.        Past Medical History:  Diagnosis Date  . Acute bronchitis 05/09/2013  . Allergy   . Bronchitis, acute 12/10/2011  . Candidal skin infection 05/05/2017  . Chicken pox as a child  . Constipation 02/20/2017  . Coronary atherosclerosis of native coronary artery    a. NSTEMI 04/20/11: tx with Promus DES to LAD; bifurcation lesion with oDx 90% tx with POBA; b. staged PCI of right post. AV Branch with Promus DES; c. residual at cath 04/21/11:  AV groove CFX 50%, mRCA 30%,; EF 55%  . Depression   . Depression with anxiety   . Dermatitis, contact 12/10/2011  . Fatigue 10/15/2012  . Heart attack (HCC) 04-20-11  . HTN (hypertension)   . Hyperlipidemia   . Hypotestosteronism 11/17/2011  . Hypothyroidism   . Keloid scar   . MS (multiple sclerosis) (HCC)   . Neuromuscular disorder (HCC)    NUMBNESS/TINGLING  . Obesity   . Onychomycosis   . Preventative health care 10/07/2011  . Reflux 10/07/2011  . Sleep apnea   . Sun-damaged skin 02/17/2016  . Testosterone deficiency 01/16/2012  . Tinea pedis   . Varicose  veins of leg with pain 11/17/2011  . Visual changes 11/13/2016    Past Surgical History:  Procedure Laterality Date  . CORONARY ANGIOPLASTY WITH STENT PLACEMENT     2012  . PAIN PUMP IMPLANTATION N/A 08/29/2014   Procedure: Baclofen pump placement;  Surgeon: Maeola Harman, MD;  Location: [facility];  Service: Neurosurgery;  Laterality: N/A;  Baclofen pump placement  . PAIN PUMP IMPLANTATION Right 09/20/2020   Procedure: Right Baclofen pump replacement;  Surgeon: Maeola Harman, MD;  Location: [facility];  Service: Neurosurgery;  Laterality: Right;  Right Baclofen pump replacement  . PROGRAMABLE BACLOFEN PUMP REVISION  08/29/14  . right wrist surgery     CTR  . VASCULAR SURGERY     vericose vein in right femoral area  . VASECTOMY    . WISDOM TOOTH EXTRACTION      Family History  Problem Relation Age of Onset  . Heart attack Mother 63  . Thyroid disease Mother   . Heart disease Mother   . Thyroid disease Sister   . Thyroid disease Brother   . Heart disease Maternal Grandmother   . Hypertension Maternal Grandmother   . Hyperlipidemia Maternal Grandmother   . Heart disease Maternal Grandfather   . Hypertension Maternal Grandfather   . Hyperlipidemia Maternal Grandfather   . Stroke Paternal Grandfather   . Thyroid disease Sister   . Heart disease Father  2 stents  . Coronary artery disease Other        questionable in father    Social History   Socioeconomic History  . Marital status: Married    Spouse name: Not on file  . Number of children: 3  . Years of education: Not on file  . Highest education level: Bachelor's degree (e.g., BA, AB, BS)  Occupational History  . Not on file  Tobacco Use  . Smoking status: Never  . Smokeless tobacco: Never  Vaping Use  . Vaping status: Never Used  Substance and Sexual Activity  . Alcohol use: Yes    Comment: socially  . Drug use: No  . 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 Not on file  Social History Narrative  . Not on file   Social Determinants of Health   Financial Resource Strain: Low Risk  (04/12/2023)   Overall Financial Resource Strain (CARDIA)   . Difficulty of Paying Living Expenses: Not hard at all  Food Insecurity: No Food Insecurity (04/12/2023)   Hunger Vital Sign   . Worried About Programme researcher, broadcasting/film/video in the Last Year: Never true   . Ran Out of Food in the Last Year: Never true  Transportation Needs: No Transportation Needs (04/12/2023)   PRAPARE - Transportation   . Lack of Transportation (Medical): No   . Lack of Transportation (Non-Medical): No  Physical Activity: Unknown (04/12/2023)   Exercise Vital Sign   . Days of Exercise per Week: 0 days   . Minutes of Exercise per Session: Not on file  Stress: Stress Concern Present (04/12/2023)   [employer] of Occupational Health - Occupational Stress Questionnaire   . Feeling of Stress : Rather much  Social Connections: Moderately Isolated (04/12/2023)   Social Connection and Isolation Panel [NHANES]   . Frequency of Communication with Friends and Family: More than three times a week   . Frequency of Social Gatherings with Friends and Family: More than three times a week   . Attends Religious Services: Never   . Active Member of Clubs or Organizations: No   . Attends Banker Meetings: Not on file   . Marital Status: Married  Catering manager Violence: Not on file    Outpatient Medications Prior to Visit  Medication Sig Dispense Refill  . aspirin EC 81 MG tablet Take 81 mg by mouth daily.    Marland Kitchen atorvastatin (LIPITOR) 40 MG tablet Take 1 tablet (40 mg total) by mouth daily. 90 tablet 1  . BACLOFEN IT 1 Dose by Intrathecal route as directed.     . busPIRone (BUSPAR) 10 MG tablet Take 1 tablet (10 mg total) by mouth in the morning, at noon, in the evening, and at bedtime. 360 tablet 3  . clonazePAM (KLONOPIN) 1 MG  tablet Take 1 tablet (1 mg total) by mouth 4 (four) times daily. 360 tablet 1  . Clonidine HCl POWD by Does not apply route. 720mg /ml IT Baclofen. Total dose 157.89mg /day. Last pump refill 06/02/16    . cyclobenzaprine (FLEXERIL) 10 MG tablet TAKE 1 TABLET BY MOUTH THREE TIMES A DAY AS NEEDED FOR MUSCLE SPASMS 90 tablet 11  . CYMBALTA 60 MG capsule Take 1 capsule (60 mg total) by mouth 2 (two) times daily. 180 capsule 3  . dalfampridine 10 MG TB12 TAKE 1 TABLET EVERY 12 HOURS 180 tablet  1  . famotidine (PEPCID) 40 MG tablet TAKE 1 TABLET DAILY AT BEDTIME 90 tablet 3  . Fe Fum-FA-B Cmp-C-Zn-Mg-Mn-Cu (HEMOCYTE-PLUS) 106-1 MG TABS Take time daily. 30 tablet 3  . furosemide (LASIX) 20 MG tablet TAKE 1 TABLET (20 MG TOTAL) BY MOUTH DAILY X 5 DAYS, THEN AS NEEDED FOR FOR SHORTNESS OF BREATH/WEIGHT GAIN>3#, PEDAL EDEMA AND CAN TAKE A SECOND TAB DAILY AS NEEDED 180 tablet 0  . gabapentin (NEURONTIN) 600 MG tablet Take 1 tablet (600 mg total) by mouth 4 (four) times daily. 360 tablet 3  . ketoconazole (NIZORAL) 2 % shampoo APPLY TOPICALLY 2 TIMES A WEEK 120 mL 5  . levothyroxine (SYNTHROID) 137 MCG tablet TAKE 1 TABLET DAILY BEFORE BREAKFAST 90 tablet 3  . methylphenidate (RITALIN) 10 MG tablet Take 1 tablet (10 mg total) by mouth 4 (four) times daily. 120 tablet 0  . natalizumab (TYSABRI) 300 MG/15ML injection Inject 300 mg into the vein every 28 (twenty-eight) days.     . nitroGLYCERIN (NITROSTAT) 0.4 MG SL tablet PLACE 1 TABLET UNDER THE TOUNGE EVERY 5 MINUTES AS NEEDED FOR CHEST PAIN (X 3 DOSES) 25 tablet 6  . nystatin cream (MYCOSTATIN) Apply 1 application  topically as directed. 30 g 2  . olmesartan-hydrochlorothiazide (BENICAR HCT) 20-12.5 MG tablet TAKE 1 TABLET DAILY 90 tablet 3  . pantoprazole (PROTONIX) 40 MG tablet Take 1 tablet (40 mg total) by mouth 2 (two) times daily. 60 tablet 2  . potassium chloride SA (KLOR-CON M20) 20 MEQ tablet TAKE 1 TABLET BY MOUTH EVERY DAY AS NEEDED WHEN TAKING A  SECOND LASIX TAB ANY GIVEN DAY 90 tablet 3  . sucralfate (CARAFATE) 1 g tablet TAKE 1 TABLET FOUR TIMES A DAY 360 tablet 3  . sucralfate (CARAFATE) 1 GM/10ML suspension Take 10 mLs (1 g total) by mouth 4 (four) times daily -  with meals and at bedtime. Take liquid or tablets but not both 420 mL 3  . tamsulosin (FLOMAX) 0.4 MG CAPS capsule TAKE 1 CAPSULE DAILY 90 capsule 3  . cetirizine (ZYRTEC) 10 MG tablet Take 10 mg by mouth daily as needed for allergies.    . clotrimazole-betamethasone (LOTRISONE) cream Apply 1 application topically 2 (two) times daily as needed (anti-fungal). 30 g 2  . ibuprofen (ADVIL,MOTRIN) 200 MG tablet Take 200 mg by mouth every 6 (six) hours as needed for moderate pain.    . mupirocin ointment (BACTROBAN) 2 % Apply 1 Application topically 2 (two) times daily. 22 g 0   No facility-administered medications prior to visit.    No Known Allergies  Review of Systems  Constitutional:  Positive for malaise/fatigue. Negative for fever.  HENT:  Negative for congestion.   Eyes:  Negative for blurred vision.  Respiratory:  Negative for shortness of breath.   Cardiovascular:  Negative for chest pain, palpitations and leg swelling.  Gastrointestinal:  Positive for abdominal pain and heartburn. Negative for blood in stool and nausea.  Genitourinary:  Negative for dysuria and frequency.  Musculoskeletal:  Negative for falls.  Skin:  Negative for rash.  Neurological:  Positive for focal weakness. Negative for dizziness, loss of consciousness and headaches.  Endo/Heme/Allergies:  Negative for environmental allergies.  Psychiatric/Behavioral:  Negative for depression. The patient is not nervous/anxious.       Objective:    Physical Exam Vitals reviewed.  Constitutional:      Appearance: Normal appearance. He is obese. He is not ill-appearing.  HENT:     Head: Normocephalic and atraumatic.  Nose: Nose normal.  Eyes:     Conjunctiva/sclera: Conjunctivae normal.   Cardiovascular:     Rate and Rhythm: Normal rate.     Pulses: Normal pulses.     Heart sounds: Normal heart sounds. No murmur heard. Pulmonary:     Effort: Pulmonary effort is normal.     Breath sounds: Normal breath sounds. No wheezing.  Abdominal:     Palpations: Abdomen is soft. There is no mass.     Tenderness: There is no abdominal tenderness.  Musculoskeletal:     Cervical back: Normal range of motion.     Right lower leg: No edema.     Left lower leg: No edema.     Comments: In motorized WC  Skin:    General: Skin is warm and dry.  Neurological:     General: No focal deficit present.     Mental Status: He is alert and oriented to person, place, and time.  Psychiatric:        Mood and Affect: Mood normal.   BP 110/61 (BP Location: Left Arm, Patient Position: Sitting, Cuff Size: Large)   Pulse 92   Temp 98.3 F (36.8 C) (Oral)   Resp 18   Wt (!) 314 lb 6.4 oz (142.6 kg) Comment: PT'S CHAIR WEIGHS 240 LBS  SpO2 97%   BMI 39.30 kg/m  Wt Readings from Last 3 Encounters:  10/27/23 (!) 314 lb 6.4 oz (142.6 kg)  10/01/23 300 lb (136.1 kg)  09/22/23 (!) 302 lb (137 kg)    Diabetic Foot Exam - Simple   No data filed    Lab Results  Component Value Date   WBC 7.6 09/22/2023   HGB 13.7 09/22/2023   HCT 42.4 09/22/2023   PLT 214 09/22/2023   GLUCOSE 84 08/06/2023   CHOL 134 08/06/2023   TRIG 84.0 08/06/2023   HDL 43.20 08/06/2023   LDLCALC 74 08/06/2023   ALT 21 08/06/2023   AST 15 08/06/2023   NA 140 08/06/2023   K 4.1 08/06/2023   CL 100 08/06/2023   CREATININE 1.04 08/06/2023   BUN 13 08/06/2023   CO2 33 (H) 08/06/2023   TSH 1.68 08/06/2023   PSA 0.52 02/11/2016   INR 1.00 04/21/2011   HGBA1C 5.7 08/06/2023    Lab Results  Component Value Date   TSH 1.68 08/06/2023   Lab Results  Component Value Date   WBC 7.6 09/22/2023   HGB 13.7 09/22/2023   HCT 42.4 09/22/2023   MCV 91 09/22/2023   PLT 214 09/22/2023   Lab Results  Component Value  Date   NA 140 08/06/2023   K 4.1 08/06/2023   CO2 33 (H) 08/06/2023   GLUCOSE 84 08/06/2023   BUN 13 08/06/2023   CREATININE 1.04 08/06/2023   BILITOT 0.7 08/06/2023   ALKPHOS 90 08/06/2023   AST 15 08/06/2023   ALT 21 08/06/2023   PROT 6.7 08/06/2023   ALBUMIN 4.3 08/06/2023   CALCIUM 9.2 08/06/2023   ANIONGAP 10 04/21/2023   EGFR 105 04/02/2021   GFR 83.76 08/06/2023   Lab Results  Component Value Date   CHOL 134 08/06/2023   Lab Results  Component Value Date   HDL 43.20 08/06/2023   Lab Results  Component Value Date   LDLCALC 74 08/06/2023   Lab Results  Component Value Date   TRIG 84.0 08/06/2023   Lab Results  Component Value Date   CHOLHDL 3 08/06/2023   Lab Results  Component Value Date  HGBA1C 5.7 08/06/2023       Assessment & Plan:  Depression with anxiety Assessment & Plan: Continues to follow with behavioral health. No change in meds   Primary hypertension Assessment & Plan: Well controlled, no changes to meds. Encouraged heart healthy diet such as the DASH diet and exercise as tolerated.    Hyperglycemia Assessment & Plan: hgba1c acceptable, minimize simple carbs. Increase exercise as tolerated.    Mixed hyperlipidemia Assessment & Plan: Encourage heart healthy diet such as MIND or DASH diet, increase exercise, avoid trans fats, simple carbohydrates and processed foods, consider a krill or fish or flaxseed oil cap daily. Tolerating Atorvastatin   Other specified hypothyroidism Assessment & Plan: On Levothyroxine, continue to monitor   Need for influenza vaccination -     Flu vaccine trivalent PF, 6mos and older(Flulaval,Afluria,Fluarix,Fluzone)  Other orders -     QMVHQI; Inject 0.25 mg into the skin once a week.  Dispense: 2 mL; Refill: 1    Assessment and Plan    Obesity Discussed the benefits of Wegovy for weight loss and its potential side effects. The patient is aware that this is a long-term commitment and is willing  to try it. -Start Wegovy at 0.25mg  once a week for 8 weeks. -Check tolerance and insurance coverage after 8 weeks. -Plan to increase the dose after 8 weeks if tolerated and covered by insurance.  Iron Supplementation The patient is having difficulty swallowing the iron pill. The last set of blood work showed improved iron levels. -Stop iron supplementation until next blood work. -Consider adding vitamin C to aid absorption if iron supplementation is needed in the future.  Colon Cancer Screening The patient has mobility issues that make colonoscopy preparation difficult. Discussed the option of Cologuard. -Order Cologuard for colon cancer screening.  Vaccinations The patient is due for the second shingles vaccine and a flu shot. Also discussed the option of a COVID booster shot. -Administer flu shot today. -Plan for COVID booster shot today. -Reschedule the second shingles vaccine for February.  Chronic Pain (MS-related) The patient reports chronic pain that is managed with Clonazepam. -Continue Clonazepam as needed for pain management.  Therapy The patient is considering reducing the frequency of therapy sessions with [facility]. -Reduce therapy sessions to monthly and reevaluate in a few months.         [name], MD

## 2023-10-27 NOTE — Patient Instructions (Signed)
 Semaglutide Injection (Weight Management) What is this medication? SEMAGLUTIDE (SEM a GLOO tide) promotes weight loss. It may also be used to maintain weight loss.  It works by decreasing appetite. It can be used to lower the risk of heart attack and stroke in people affected by excess weight. Changes to diet and exercise are often combined with this medication. This medicine may be used for other purposes; ask your health care provider or pharmacist if you have questions. COMMON BRAND NAME(S): GNFAOZ What should I tell my care team before I take this medication? They need to know if you have any of these conditions: Diabetes Eye disease caused by diabetes History of depression or other mental health conditions Kidney disease Pancreatic disease Personal or family history of MEN 2, a condition that causes endocrine gland tumors Personal or family history of thyroid cancer Suicidal thoughts, plans, or attempt by you or a family member An unusual or allergic reaction to semaglutide, other medications, foods, dyes, or preservatives Pregnant or trying to get pregnant Breastfeeding How should I use this medication? This medication is injected under the skin. You will be taught how to prepare and give it. Take it as directed on the prescription label. It is given once every week (every 7 days). Keep taking it unless your care team tells you to stop. It is important that you put your used needles and pens in a special sharps container. Do not put them in a trash can. If you do not have a sharps container, call your pharmacist or care team to get one. A special MedGuide will be given to you by the pharmacist with each prescription and refill. Be sure to read this information carefully each time. This medication comes with INSTRUCTIONS FOR USE. Ask your pharmacist for directions on how to use this medication. Read the information carefully. Talk to your pharmacist or care team if you have  questions. Talk to your care team about the use of this medication in children. While it may be prescribed for children as young as 12 years for selected conditions, precautions do apply. Overdosage: If you think you have taken too much of this medicine contact a poison control center or emergency room at once. NOTE: This medicine is only for you. Do not share this medicine with others. What if I miss a dose? If you miss a dose and the next scheduled dose is more than 2 days away, take the missed dose as soon as possible. If you miss a dose and the next scheduled dose is less than 2 days away, do not take the missed dose. Take the next dose at your regular time. Do not take double or extra doses. If you miss your dose for 2 weeks or more, take the next dose at your regular time or call your care team to talk about how to restart this medication. What may interact with this medication? Insulin and other medications for diabetes This list may not describe all possible interactions. Give your health care provider a list of all the medicines, herbs, non-prescription drugs, or dietary supplements you use. Also tell them if you smoke, drink alcohol, or use illegal drugs. Some items may interact with your medicine. What should I watch for while using this medication? Visit your care team for regular checks on your progress. It may be some time before you see the benefit from this medication. Drink plenty of fluids while taking this medication. Check with your care team if you have severe  diarrhea, nausea, and vomiting, or if you sweat a lot. The loss of too much body fluid may make it dangerous for you to take this medication. This medication may affect blood sugar levels. Ask your care team if changes in diet or medications are needed if you have diabetes. Talk to your care team if you may be pregnant. Losing weight while pregnant is not advised and may cause harm to the fetus. Talk to your care team for more  information. What side effects may I notice from receiving this medication? Side effects that you should report to your care team as soon as possible: Allergic reactions--skin rash, itching, hives, swelling of the face, lips, tongue, or throat Change in vision Dehydration--increased thirst, dry mouth, feeling faint or lightheaded, headache, dark yellow or brown urine Gallbladder problems--severe stomach pain, nausea, vomiting, fever Heart palpitations--rapid, pounding, or irregular heartbeat Kidney injury--decrease in the amount of urine, swelling of the ankles, hands, or feet Pancreatitis--severe stomach pain that spreads to your back or gets worse after eating or when touched, fever, nausea, vomiting Thoughts of suicide or self-harm, worsening mood, feelings of depression Thyroid cancer--new mass or lump in the neck, pain or trouble swallowing, trouble breathing, hoarseness Side effects that usually do not require medical attention (report these to your care team if they continue or are bothersome): Diarrhea Loss of appetite Nausea Upset stomach This list may not describe all possible side effects. Call your doctor for medical advice about side effects. You may report side effects to FDA at 1-800-FDA-1088. Where should I keep my medication? Keep out of the reach of children and pets. Refrigeration (preferred): Store in the refrigerator. Do not freeze. Keep this medication in the original container until you are ready to take it. Get rid of any unused medication after the expiration date. Room temperature: If needed, prior to cap removal, the pen can be stored at room temperature for up to 28 days. Protect from light. If it is stored at room temperature, get rid of any unused medication after 28 days or after it expires, whichever is first. It is important to get rid of the medication as soon as you no longer need it or it is expired. You can do this in two ways: Take the medication to a  medication take-back program. Check with your pharmacy or law enforcement to find a location. If you cannot return the medication, follow the directions in the MedGuide. NOTE: This sheet is a summary. It may not cover all possible information. If you have questions about this medicine, talk to your doctor, pharmacist, or health care provider.  2024 Elsevier/Gold Standard (2023-02-25 00:00:00)

## 2023-10-27 NOTE — Telephone Encounter (Signed)
Faxed pt's Order Request forms to [facility] [phone] on 10/27/2023

## 2023-10-29 ENCOUNTER — Ambulatory Visit: Payer: 59 | Admitting: Psychology

## 2023-10-29 DIAGNOSIS — F331 Major depressive disorder, recurrent, moderate: Secondary | ICD-10-CM

## 2023-10-29 NOTE — Progress Notes (Signed)
[facility] Counselor/Therapist Progress Note  Patient ID: Keith Morrow, MRN: [id number],    Date: 10/29/2023  Time Spent: 4:00pm-4:50pm   50 minutes   Treatment Type: Individual Therapy  Reported Symptoms: stress  Mental Status Exam: Appearance:  Casual     Behavior: Appropriate  Motor: Normal  Speech/Language:  Normal Rate  Affect: Appropriate  Mood: normal  Thought process: normal  Thought content:   WNL  Sensory/Perceptual disturbances:   WNL  Orientation: oriented to person, place, time/date, and situation  Attention: Good  Concentration: Good  Memory: WNL  Fund of knowledge:  Good  Insight:   Good  Judgment:  Good  Impulse Control: Good   Risk Assessment: Danger to Self:  No Self-injurious Behavior: No Danger to Others: No Duty to Warn:no Physical Aggression / Violence:No  Access to Firearms a concern: No  Gang Involvement:No   Subjective: Pt present for face-to-face individual therapy via video.  Pt consents to telehealth video session and is aware of limitations and benefits of video sessions.  Location of pt: home Location of therapist: home office.   Pt talked about work.  Pt states his job has been busy and stressful bc there are many things that have to get done before the end of the year.   Worked on [employer]ician, dispensing.    Pt will be on vacation starting next week through 12/9.  He may work from home intermittently.  Pt will also have a couple of weeks off at Christmas.   Pt talked about his health.  He has gained weight and wants to work on getting more healthy.  Pt's pain has increased some with the colder weather.  Worked on Pharmacologist. Worked on self care strategies. Provided supportive therapy.  Interventions: Cognitive Behavioral Therapy and Insight-Oriented  Diagnosis:  F33.1  Plan of Care: Recommend ongoing therapy.   Plan to meet every two weeks.   Pt is progressing toward goals.    Treatment Plan  (Treatment Plan Target  Date: 04/08/2024) Client Abilities/Strengths  Pt is bright, engaging, and motivated for therapy.   Client Treatment Preferences  Individual therapy.  Client Statement of Needs  Improve coping skills.  Symptoms  Depressed or irritable mood. Feelings of hopelessness, worthlessness, or inappropriate guilt. Low self-esteem.  Problems Addressed  Unipolar Depression Goals 1. Alleviate depressive symptoms and return to previous level of effective functioning. 2. Appropriately grieve the loss in order to normalize mood and to return to previously adaptive level of functioning. Objective Learn and implement behavioral strategies to overcome depression. Target Date: 2024-04-08 Frequency: Biweekly  Progress: 60 Modality: individual  Related Interventions Engage the client in "behavioral activation," increasing his/her activity level and contact with sources of reward, while identifying processes that inhibit activation.  Use behavioral techniques such as instruction, rehearsal, role-playing, role reversal, as needed, to facilitate activity in the client's daily life; reinforce success. Assist the client in developing skills that increase the likelihood of deriving pleasure from behavioral activation (e.g., assertiveness skills, developing an exercise plan, less internal/more external focus, increased social involvement); reinforce success. Objective Identify important people in life, past and present, and describe the quality, good and poor, of those relationships. Target Date: 2024-04-08 Frequency: Biweekly  Progress: 60 Modality: individual  Related Interventions Conduct Interpersonal Therapy beginning with the assessment of the client's "interpersonal inventory" of important past and present relationships; develop a case formulation linking depression to grief, interpersonal role disputes, role transitions, and/or interpersonal deficits). Objective Learn and implement problem-solving and  decision-making skills. Target Date: 2024-04-08 Frequency: Biweekly  Progress: 60 Modality: individual  Related Interventions Conduct Problem-Solving Therapy using techniques such as psychoeducation, modeling, and role-playing to teach client problem-solving skills (i.e., defining a problem specifically, generating possible solutions, evaluating the pros and cons of each solution, selecting and implementing a plan of action, evaluating the efficacy of the plan, accepting or revising the plan); role-play application of the problem-solving skill to a real life issue. Encourage in the client the development of a positive problem orientation in which problems and solving them are viewed as a natural part of life and not something to be feared, despaired, or avoided. 3. Develop healthy interpersonal relationships that lead to the alleviation and help prevent the relapse of depression. 4. Develop healthy thinking patterns and beliefs about self, others, and the world that lead to the alleviation and help prevent the relapse of depression. 5. Recognize, accept, and cope with feelings of depression. Diagnosis F33.1  Conditions For Discharge Achievement of treatment goals and objectives   Salomon Fick, LCSW                  Summer Parthasarathy Santee, LCSW

## 2023-11-09 ENCOUNTER — Other Ambulatory Visit (HOSPITAL_BASED_OUTPATIENT_CLINIC_OR_DEPARTMENT_OTHER): Payer: Self-pay

## 2023-11-09 ENCOUNTER — Telehealth: Payer: Self-pay

## 2023-11-09 ENCOUNTER — Telehealth: Payer: Self-pay | Admitting: Neurology

## 2023-11-09 ENCOUNTER — Other Ambulatory Visit (HOSPITAL_COMMUNITY): Payer: Self-pay

## 2023-11-09 NOTE — Telephone Encounter (Signed)
Pharmacy Patient Advocate Encounter   Received notification from [employer] that Prior Authorization for wegovy has been CANCELLED due to   RX wegovy is not a covered medication and the pt will have to pay 100% of the cost of the medication    Cost is around/about 1537.88$  PA #/Case ID/Reference #: Key: [id number]

## 2023-11-09 NOTE — Telephone Encounter (Signed)
Courtney from [facility] called and Lvm stating that most of their branches will be closing and that they have transitioned pt to the [facility]. They will be handling his Tysabre shipment and their information is [phone] Fax# [phone] Please call Toni Amend back to confirm that this information has been received.

## 2023-11-10 NOTE — Telephone Encounter (Signed)
RX wegovy is not a covered medication and the pt will have to pay 100% of the cost of the medication

## 2023-11-10 NOTE — Telephone Encounter (Signed)
Called [facility] back and spoke w/ Tiffany. She was wrong department. Transferred me to infusion [phone]). I spoke w/ Cordelia Pen. She will let the know we received message.

## 2023-11-12 ENCOUNTER — Encounter: Payer: Self-pay | Admitting: Podiatry

## 2023-11-12 ENCOUNTER — Ambulatory Visit: Payer: 59 | Admitting: Psychology

## 2023-11-12 ENCOUNTER — Ambulatory Visit (INDEPENDENT_AMBULATORY_CARE_PROVIDER_SITE_OTHER): Payer: Managed Care, Other (non HMO) | Admitting: Podiatry

## 2023-11-12 DIAGNOSIS — F331 Major depressive disorder, recurrent, moderate: Secondary | ICD-10-CM

## 2023-11-12 DIAGNOSIS — L600 Ingrowing nail: Secondary | ICD-10-CM

## 2023-11-12 NOTE — Progress Notes (Signed)
[facility] Counselor/Therapist Progress Note  Patient ID: TEAGEN HOGLAND, MRN: [id number],    Date: 11/12/2023  Time Spent: 11:00am-11:50am   50 minutes   Treatment Type: Individual Therapy  Reported Symptoms: stress  Mental Status Exam: Appearance:  Casual     Behavior: Appropriate  Motor: Normal  Speech/Language:  Normal Rate  Affect: Appropriate  Mood: normal  Thought process: normal  Thought content:   WNL  Sensory/Perceptual disturbances:   WNL  Orientation: oriented to person, place, time/date, and situation  Attention: Good  Concentration: Good  Memory: WNL  Fund of knowledge:  Good  Insight:   Good  Judgment:  Good  Impulse Control: Good   Risk Assessment: Danger to Self:  No Self-injurious Behavior: No Danger to Others: No Duty to Warn:no Physical Aggression / Violence:No  Access to Firearms a concern: No  Gang Involvement:No   Subjective: Pt present for face-to-face individual therapy via video.  Pt consents to telehealth video session and is aware of limitations and benefits of video sessions.  Location of pt: home Location of therapist: home office.   Pt talked about work.   He is on vacation and is helping his wife with the book fair at her school.   Pt talked about Thanksgiving.   They had a quiet Thanksgiving at home and it went well.   Pt talked about his daughter [name] who is thinking about marrying her girlfriend.  She is worried that Trump will reverse the legalization of gay marriages.   [name] plans to get married April 10th.   Pt is frustrated and concerned that [name] is rushing marriage and making the decision based on fear. Addressed pt's concerns.  Helped pt process his feelings and let go of things that are beyond his control.  Worked on Pharmacologist. Worked on self care strategies. Provided supportive therapy.  Interventions: Cognitive Behavioral Therapy and Insight-Oriented  Diagnosis:  F33.1  Plan of Care: Recommend  ongoing therapy.   Plan to meet every two weeks.   Pt is progressing toward goals.    Treatment Plan  (Treatment Plan Target Date: 04/08/2024) Client Abilities/Strengths  Pt is bright, engaging, and motivated for therapy.   Client Treatment Preferences  Individual therapy.  Client Statement of Needs  Improve coping skills.  Symptoms  Depressed or irritable mood. Feelings of hopelessness, worthlessness, or inappropriate guilt. Low self-esteem.  Problems Addressed  Unipolar Depression Goals 1. Alleviate depressive symptoms and return to previous level of effective functioning. 2. Appropriately grieve the loss in order to normalize mood and to return to previously adaptive level of functioning. Objective Learn and implement behavioral strategies to overcome depression. Target Date: 2024-04-08 Frequency: Biweekly  Progress: 60 Modality: individual  Related Interventions Engage the client in "behavioral activation," increasing his/her activity level and contact with sources of reward, while identifying processes that inhibit activation.  Use behavioral techniques such as instruction, rehearsal, role-playing, role reversal, as needed, to facilitate activity in the client's daily life; reinforce success. Assist the client in developing skills that increase the likelihood of deriving pleasure from behavioral activation (e.g., assertiveness skills, developing an exercise plan, less internal/more external focus, increased social involvement); reinforce success. Objective Identify important people in life, past and present, and describe the quality, good and poor, of those relationships. Target Date: 2024-04-08 Frequency: Biweekly  Progress: 60 Modality: individual  Related Interventions Conduct Interpersonal Therapy beginning with the assessment of the client's "interpersonal inventory" of important past and present relationships; develop a case formulation linking  depression to grief,  interpersonal role disputes, role transitions, and/or interpersonal deficits). Objective Learn and implement problem-solving and decision-making skills. Target Date: 2024-04-08 Frequency: Biweekly  Progress: 60 Modality: individual  Related Interventions Conduct Problem-Solving Therapy using techniques such as psychoeducation, modeling, and role-playing to teach client problem-solving skills (i.e., defining a problem specifically, generating possible solutions, evaluating the pros and cons of each solution, selecting and implementing a plan of action, evaluating the efficacy of the plan, accepting or revising the plan); role-play application of the problem-solving skill to a real life issue. Encourage in the client the development of a positive problem orientation in which problems and solving them are viewed as a natural part of life and not something to be feared, despaired, or avoided. 3. Develop healthy interpersonal relationships that lead to the alleviation and help prevent the relapse of depression. 4. Develop healthy thinking patterns and beliefs about self, others, and the world that lead to the alleviation and help prevent the relapse of depression. 5. Recognize, accept, and cope with feelings of depression. Diagnosis F33.1  Conditions For Discharge Achievement of treatment goals and objectives   Salomon Fick, LCSW

## 2023-11-12 NOTE — Progress Notes (Signed)
  Subjective:  Patient ID: Keith Morrow, male    DOB: [birth year],   MRN: [id number]  Chief Complaint  Patient presents with   Nail Problem   Ingrown Toenail    Pt presents for ingrown  left toenail states he is doing well     52 y.o. male presents for follow-up of left ingrown toenail removal. Relates doing well. No pain and was soaking as instructed . Denies any other pedal complaints. Denies n/v/f/c.   Past Medical History:  Diagnosis Date   Acute bronchitis 05/09/2013   Allergy    Bronchitis, acute 12/10/2011   Candidal skin infection 05/05/2017   Chicken pox as a child   Constipation 02/20/2017   Coronary atherosclerosis of native coronary artery    a. NSTEMI 04/20/11: tx with Promus DES to LAD; bifurcation lesion with oDx 90% tx with POBA; b. staged PCI of right post. AV Branch with Promus DES; c. 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 CFT <3 seconds. Normal hair growth on digits. No edema.  Skin. No lacerations or abrasions bilateral feet. Left hallux nail healing well.  Musculoskeletal: MMT 5/5 bilateral lower extremities in DF, PF, Inversion and Eversion. Deceased ROM in DF of ankle joint.  Neurological: Sensation intact to light touch.   Assessment:   1. Ingrown left greater toenail      Plan:  Patient was  evaluated and treated and all questions answered. Toe was evaluated and appears to be healing well.  May discontinue soaks and neosporin.  Patient to follow-up as needed.    Louann Sjogren, DPM

## 2023-11-12 NOTE — Addendum Note (Signed)
Addended by: Marian Sorrow D on: 11/12/2023 02:48 PM   Modules accepted: Orders

## 2023-11-23 ENCOUNTER — Encounter: Payer: Self-pay | Admitting: Family Medicine

## 2023-11-26 ENCOUNTER — Ambulatory Visit: Payer: 59 | Admitting: Psychology

## 2023-11-26 DIAGNOSIS — F331 Major depressive disorder, recurrent, moderate: Secondary | ICD-10-CM

## 2023-11-26 NOTE — Progress Notes (Signed)
[facility] Counselor/Therapist Progress Note  Patient ID: Keith Morrow, MRN: [id number],    Date: 11/26/2023  Time Spent: 11:00am-11:50am   50 minutes   Treatment Type: Individual Therapy  Reported Symptoms: stress  Mental Status Exam: Appearance:  Casual     Behavior: Appropriate  Motor: Normal  Speech/Language:  Normal Rate  Affect: Appropriate  Mood: normal  Thought process: normal  Thought content:   WNL  Sensory/Perceptual disturbances:   WNL  Orientation: oriented to person, place, time/date, and situation  Attention: Good  Concentration: Good  Memory: WNL  Fund of knowledge:  Good  Insight:   Good  Judgment:  Good  Impulse Control: Good   Risk Assessment: Danger to Self:  No Self-injurious Behavior: No Danger to Others: No Duty to Warn:no Physical Aggression / Violence:No  Access to Firearms a concern: No  Gang Involvement:No   Subjective: Pt present for face-to-face individual therapy via video.  Pt consents to telehealth video session and is aware of limitations and benefits of video sessions.  Location of pt: home Location of therapist: home office.   Pt talked about work.  He is suppose to be on vacation but still has emails to deal with and some conference calls.  It frustrates pt at times that he has to do some work on vacation.  But sometimes he gets bored at home so does not mind engaging in work at those times.   Pt talked about the holidays.  Pt's kids will be home and pt is looking forward to that.   Pt talked about yesterday being a "bummer day".  [name] is still working until the end of the week.   Pt was alone at home and felt lonely and bored.   Pt talked about his aunt dying unexpectedly.  Helped pt process his feelings and grief.   Pt talked about his health.  He is having pain issues.  The bottoms of his feet hurt a lot every day.  Worked on Pharmacologist. Worked on self care strategies. Provided supportive  therapy.  Interventions: Cognitive Behavioral Therapy and Insight-Oriented  Diagnosis:  F33.1  Plan of Care: Recommend ongoing therapy.   Plan to meet every two weeks.   Pt is progressing toward goals.    Treatment Plan  (Treatment Plan Target Date: 04/08/2024) Client Abilities/Strengths  Pt is bright, engaging, and motivated for therapy.   Client Treatment Preferences  Individual therapy.  Client Statement of Needs  Improve coping skills.  Symptoms  Depressed or irritable mood. Feelings of hopelessness, worthlessness, or inappropriate guilt. Low self-esteem.  Problems Addressed  Unipolar Depression Goals 1. Alleviate depressive symptoms and return to previous level of effective functioning. 2. Appropriately grieve the loss in order to normalize mood and to return to previously adaptive level of functioning. Objective Learn and implement behavioral strategies to overcome depression. Target Date: 2024-04-08 Frequency: Biweekly  Progress: 60 Modality: individual  Related Interventions Engage the client in "behavioral activation," increasing his/her activity level and contact with sources of reward, while identifying processes that inhibit activation.  Use behavioral techniques such as instruction, rehearsal, role-playing, role reversal, as needed, to facilitate activity in the client's daily life; reinforce success. Assist the client in developing skills that increase the likelihood of deriving pleasure from behavioral activation (e.g., assertiveness skills, developing an exercise plan, less internal/more external focus, increased social involvement); reinforce success. Objective Identify important people in life, past and present, and describe the quality, good and poor, of those relationships. Target Date:  2024-04-08 Frequency: Biweekly  Progress: 60 Modality: individual  Related Interventions Conduct Interpersonal Therapy beginning with the assessment of the client's  "interpersonal inventory" of important past and present relationships; develop a case formulation linking depression to grief, interpersonal role disputes, role transitions, and/or interpersonal deficits). Objective Learn and implement problem-solving and decision-making skills. Target Date: 2024-04-08 Frequency: Biweekly  Progress: 60 Modality: individual  Related Interventions Conduct Problem-Solving Therapy using techniques such as psychoeducation, modeling, and role-playing to teach client problem-solving skills (i.e., defining a problem specifically, generating possible solutions, evaluating the pros and cons of each solution, selecting and implementing a plan of action, evaluating the efficacy of the plan, accepting or revising the plan); role-play application of the problem-solving skill to a real life issue. Encourage in the client the development of a positive problem orientation in which problems and solving them are viewed as a natural part of life and not something to be feared, despaired, or avoided. 3. Develop healthy interpersonal relationships that lead to the alleviation and help prevent the relapse of depression. 4. Develop healthy thinking patterns and beliefs about self, others, and the world that lead to the alleviation and help prevent the relapse of depression. 5. Recognize, accept, and cope with feelings of depression. Diagnosis F33.1  Conditions For Discharge Achievement of treatment goals and objectives   Salomon Fick, LCSW

## 2023-11-28 ENCOUNTER — Other Ambulatory Visit: Payer: Self-pay | Admitting: Neurology

## 2023-12-01 MED ORDER — METHYLPHENIDATE HCL 10 MG PO TABS: 10.0000 mg | ORAL_TABLET | Freq: Four times a day (QID) | ORAL | 0 refills | Status: DC

## 2023-12-01 NOTE — Telephone Encounter (Signed)
Requested Prescriptions   Pending Prescriptions Disp Refills   methylphenidate (RITALIN) 10 MG tablet 120 tablet 0    Sig: Take 1 tablet (10 mg total) by mouth 4 (four) times daily.   Last seen 09/22/23 Next appt 03/29/24   About this Score  Dispenses   Dispensed Days Supply Quantity Provider Pharmacy  METHYLPHENIDATE 10 MG TABLET 09/22/2023 30 120 each Asa Lente, MD [facility] [phone] - [place]...  METHYLPHENIDATE 10 MG TABLET 08/07/2023 30 120 each Windell Norfolk, MD [facility] [phone] - [place]...  METHYLPHENIDATE 10 MG TABLET 03/12/2023 30 120 each Asa Lente, MD [facility] [phone] - [place]...  METHYLPHENIDATE 10 MG TABLET 01/30/2023 30 120 each Asa Lente, MD [facility] [phone] - [place]...  METHYLPHENIDATE 10 MG TABLET 12/03/2022 30 120 each Asa Lente, MD [facility] [phone] - O.Marland KitchenMarland Kitchen

## 2023-12-03 ENCOUNTER — Other Ambulatory Visit: Payer: Self-pay | Admitting: Family Medicine

## 2023-12-03 DIAGNOSIS — K219 Gastro-esophageal reflux disease without esophagitis: Secondary | ICD-10-CM

## 2023-12-07 ENCOUNTER — Ambulatory Visit (INDEPENDENT_AMBULATORY_CARE_PROVIDER_SITE_OTHER): Payer: 59 | Admitting: Psychology

## 2023-12-07 DIAGNOSIS — F331 Major depressive disorder, recurrent, moderate: Secondary | ICD-10-CM

## 2023-12-07 NOTE — Progress Notes (Signed)
Keith Morrow/Therapist Progress Note  Patient ID: Keith Morrow, MRN: [id number],    Date: 12/07/2023  Time Spent: 3:00pm-3:50pm   50 minutes   Treatment Type: Individual Therapy  Reported Symptoms: stress  Mental Status Exam: Appearance:  Casual     Behavior: Appropriate  Motor: Normal  Speech/Language:  Normal Rate  Affect: Appropriate  Mood: normal  Thought process: normal  Thought content:   WNL  Sensory/Perceptual disturbances:   WNL  Orientation: oriented to person, place, time/date, and situation  Attention: Good  Concentration: Good  Memory: WNL  Fund of knowledge:  Good  Insight:   Good  Judgment:  Good  Impulse Control: Good   Risk Assessment: Danger to Self:  No Self-injurious Behavior: No Danger to Others: No Duty to Warn:no Physical Aggression / Violence:No  Access to Firearms a concern: No  Gang Involvement:No   Subjective: Pt present for face-to-face individual therapy via video.  Pt consents to telehealth video session and is aware of limitations and benefits of video sessions.  Location of pt: home Location of therapist: home office.   Pt talked about Christmas.   His kids were home and they had a nice time.  Pt's daughter [name] can be challenging to deal with.   Addressed the challenges and how they impacted pt.  Pt has been learning how to choose his battles with [facility].   Pt talked about his son [name] and his girlfriend.   Pt thinks there may be a wedding in their near future.   Pt talked about work.  He goes back to work on Jan. 2nd and states it will be hard adjusting from vacation sleep schedule to work schedule.  Worked on self care strategies. Provided supportive therapy.  Interventions: Cognitive Behavioral Therapy and Insight-Oriented  Diagnosis:  F33.1  Plan of Care: Recommend ongoing therapy.   Plan to meet every two weeks.   Pt is progressing toward goals.    Treatment Plan  (Treatment Plan Target Date:  04/08/2024) Client Abilities/Strengths  Pt is bright, engaging, and motivated for therapy.   Client Treatment Preferences  Individual therapy.  Client Statement of Needs  Improve coping skills.  Symptoms  Depressed or irritable mood. Feelings of hopelessness, worthlessness, or inappropriate guilt. Low self-esteem.  Problems Addressed  Unipolar Depression Goals 1. Alleviate depressive symptoms and return to previous level of effective functioning. 2. Appropriately grieve the loss in order to normalize mood and to return to previously adaptive level of functioning. Objective Learn and implement behavioral strategies to overcome depression. Target Date: 2024-04-08 Frequency: Biweekly  Progress: 60 Modality: individual  Related Interventions Engage the client in "behavioral activation," increasing his/her activity level and contact with sources of reward, while identifying processes that inhibit activation.  Use behavioral techniques such as instruction, rehearsal, role-playing, role reversal, as needed, to facilitate activity in the client's daily life; reinforce success. Assist the client in developing skills that increase the likelihood of deriving pleasure from behavioral activation (e.g., assertiveness skills, developing an exercise plan, less internal/more external focus, increased social involvement); reinforce success. Objective Identify important people in life, past and present, and describe the quality, good and poor, of those relationships. Target Date: 2024-04-08 Frequency: Biweekly  Progress: 60 Modality: individual  Related Interventions Conduct Interpersonal Therapy beginning with the assessment of the client's "interpersonal inventory" of important past and present relationships; develop a case formulation linking depression to grief, interpersonal role disputes, role transitions, and/or interpersonal deficits). Objective Learn and implement problem-solving and  decision-making  skills. Target Date: 2024-04-08 Frequency: Biweekly  Progress: 60 Modality: individual  Related Interventions Conduct Problem-Solving Therapy using techniques such as psychoeducation, modeling, and role-playing to teach client problem-solving skills (i.e., defining a problem specifically, generating possible solutions, evaluating the pros and cons of each solution, selecting and implementing a plan of action, evaluating the efficacy of the plan, accepting or revising the plan); role-play application of the problem-solving skill to a real life issue. Encourage in the client the development of a positive problem orientation in which problems and solving them are viewed as a natural part of life and not something to be feared, despaired, or avoided. 3. Develop healthy interpersonal relationships that lead to the alleviation and help prevent the relapse of depression. 4. Develop healthy thinking patterns and beliefs about self, others, and the world that lead to the alleviation and help prevent the relapse of depression. 5. Recognize, accept, and cope with feelings of depression. Diagnosis F33.1  Conditions For Discharge Achievement of treatment goals and objectives   Salomon Fick, LCSW

## 2023-12-10 ENCOUNTER — Ambulatory Visit: Payer: 59 | Admitting: Psychology

## 2023-12-16 ENCOUNTER — Other Ambulatory Visit: Payer: Self-pay | Admitting: Family Medicine

## 2023-12-16 ENCOUNTER — Encounter: Payer: Self-pay | Admitting: Family Medicine

## 2023-12-16 DIAGNOSIS — R195 Other fecal abnormalities: Secondary | ICD-10-CM

## 2023-12-16 LAB — COLOGUARD: COLOGUARD: POSITIVE — AB

## 2023-12-17 ENCOUNTER — Telehealth: Payer: Self-pay | Admitting: *Deleted

## 2023-12-17 NOTE — Telephone Encounter (Signed)
 Late entry: Refill request received from [facility] for Baclofen pump refill. Dr Epimenio Foot signed Rx for Baclofen 1800 mcg/mL, Clonidine 420 mcg/mL, and Hydromorphone 400 mcg/mL. Rx faxed back to [facility] on 12/16/23. Received a receipt of confirmation.

## 2023-12-23 ENCOUNTER — Telehealth: Payer: Self-pay | Admitting: *Deleted

## 2023-12-23 NOTE — Telephone Encounter (Signed)
 Faxed signed Plan of treatment re: Tysabri to [facility] at [phone]. Received fax confirmation.  Also faxed below signed orders to [facility] at [phone]. Received fax confirmation.

## 2023-12-24 ENCOUNTER — Ambulatory Visit: Payer: 59 | Admitting: Psychology

## 2023-12-24 DIAGNOSIS — F331 Major depressive disorder, recurrent, moderate: Secondary | ICD-10-CM

## 2023-12-24 NOTE — Progress Notes (Signed)
[facility] Counselor/Therapist Progress Note  Patient ID: Keith Morrow, MRN: [id number],    Date: 12/24/2023  Time Spent: 11:00am-11:50am   50 minutes   Treatment Type: Individual Therapy  Reported Symptoms: stress  Mental Status Exam: Appearance:  Casual     Behavior: Appropriate  Motor: Normal  Speech/Language:  Normal Rate  Affect: Appropriate  Mood: normal  Thought process: normal  Thought content:   WNL  Sensory/Perceptual disturbances:   WNL  Orientation: oriented to person, place, time/date, and situation  Attention: Good  Concentration: Good  Memory: WNL  Fund of knowledge:  Good  Insight:   Good  Judgment:  Good  Impulse Control: Good   Risk Assessment: Danger to Self:  No Self-injurious Behavior: No Danger to Others: No Duty to Warn:no Physical Aggression / Violence:No  Access to Firearms a concern: No  Gang Involvement:No   Subjective: Pt present for face-to-face individual therapy via video.  Pt consents to telehealth video session and is aware of limitations and benefits of video sessions.  Location of pt: home Location of therapist: home office.   Pt talked about getting ready to go out of town.   Pt and Keith Morrow will be going to [place] to see Keith Morrow.   Keith Morrow had to find housing on his own and it was stressful.  Pt and Keith Morrow will take things for Keith Morrow' apartment and will help him get moved in.   Pt is looking forward to the trip and seeing Keith Morrow.   Pt talked about he appointment with Dr. Rogelia Morrow.  He took a cologuard test and it came back positive which means there is something abnormal.   He will need to have a colonoscopy.  He will need to be hospitalized for a night.   Pt is anxious about the colonoscopy.   Worked with pt on managing worry thoughts.    Worked on self care strategies. Provided supportive therapy.  Interventions: Cognitive Behavioral Therapy and Insight-Oriented  Diagnosis:  F33.1  Plan of Care: Recommend ongoing  therapy.   Plan to meet every two weeks.   Pt is progressing toward goals.    Treatment Plan  (Treatment Plan Target Date: 04/08/2024) Client Abilities/Strengths  Pt is bright, engaging, and motivated for therapy.   Client Treatment Preferences  Individual therapy.  Client Statement of Needs  Improve coping skills.  Symptoms  Depressed or irritable mood. Feelings of hopelessness, worthlessness, or inappropriate guilt. Low self-esteem.  Problems Addressed  Unipolar Depression Goals 1. Alleviate depressive symptoms and return to previous level of effective functioning. 2. Appropriately grieve the loss in order to normalize mood and to return to previously adaptive level of functioning. Objective Learn and implement behavioral strategies to overcome depression. Target Date: 2024-04-08 Frequency: Biweekly  Progress: 60 Modality: individual  Related Interventions Engage the client in "behavioral activation," increasing his/her activity level and contact with sources of reward, while identifying processes that inhibit activation.  Use behavioral techniques such as instruction, rehearsal, role-playing, role reversal, as needed, to facilitate activity in the client's daily life; reinforce success. Assist the client in developing skills that increase the likelihood of deriving pleasure from behavioral activation (e.g., assertiveness skills, developing an exercise plan, less internal/more external focus, increased social involvement); reinforce success. Objective Identify important people in life, past and present, and describe the quality, good and poor, of those relationships. Target Date: 2024-04-08 Frequency: Biweekly  Progress: 60 Modality: individual  Related Interventions Conduct Interpersonal Therapy beginning with the assessment of the client's "interpersonal inventory"  of important past and present relationships; develop a case formulation linking depression to grief, interpersonal role  disputes, role transitions, and/or interpersonal deficits). Objective Learn and implement problem-solving and decision-making skills. Target Date: 2024-04-08 Frequency: Biweekly  Progress: 60 Modality: individual  Related Interventions Conduct Problem-Solving Therapy using techniques such as psychoeducation, modeling, and role-playing to teach client problem-solving skills (i.e., defining a problem specifically, generating possible solutions, evaluating the pros and cons of each solution, selecting and implementing a plan of action, evaluating the efficacy of the plan, accepting or revising the plan); role-play application of the problem-solving skill to a real life issue. Encourage in the client the development of a positive problem orientation in which problems and solving them are viewed as a natural part of life and not something to be feared, despaired, or avoided. 3. Develop healthy interpersonal relationships that lead to the alleviation and help prevent the relapse of depression. 4. Develop healthy thinking patterns and beliefs about self, others, and the world that lead to the alleviation and help prevent the relapse of depression. 5. Recognize, accept, and cope with feelings of depression. Diagnosis F33.1  Conditions For Discharge Achievement of treatment goals and objectives   Salomon Fick, LCSW

## 2023-12-26 ENCOUNTER — Other Ambulatory Visit: Payer: Self-pay | Admitting: Family Medicine

## 2023-12-30 ENCOUNTER — Other Ambulatory Visit: Payer: Self-pay | Admitting: Gastroenterology

## 2023-12-30 DIAGNOSIS — K219 Gastro-esophageal reflux disease without esophagitis: Secondary | ICD-10-CM

## 2024-01-04 ENCOUNTER — Telehealth: Payer: Self-pay | Admitting: Gastroenterology

## 2024-01-04 ENCOUNTER — Encounter: Payer: Self-pay | Admitting: Neurology

## 2024-01-04 NOTE — Telephone Encounter (Signed)
I understand those challenges for him and why he is asking this.  Unfortunately, provider staffing and hospital bed availability do not allow for elective admissions for bowel preparation.  H Danis

## 2024-01-04 NOTE — Telephone Encounter (Signed)
Good afternoon Dr. Myrtie Neither,  The following patient needs to have a colonoscopy but he has MS and no ability to move his lower limbs so he has issues with using the bathrooom. Which is a concern with prep. He wants to know if he is able to do a one day prep and stay in the hospital to have this procedure done. Please advise. Thank you.

## 2024-01-06 ENCOUNTER — Telehealth: Payer: Self-pay

## 2024-01-06 NOTE — Telephone Encounter (Signed)
Marland Kitchen

## 2024-01-07 ENCOUNTER — Ambulatory Visit: Payer: 59 | Admitting: Psychology

## 2024-01-07 DIAGNOSIS — F331 Major depressive disorder, recurrent, moderate: Secondary | ICD-10-CM | POA: Diagnosis not present

## 2024-01-07 NOTE — Progress Notes (Signed)
[facility] Counselor/Therapist Progress Note  Patient ID: Keith Morrow, MRN: [id number],    Date: 01/07/2024  Time Spent: 3:00pm-3:50pm   50 minutes   Treatment Type: Individual Therapy  Reported Symptoms: stress  Mental Status Exam: Appearance:  Casual     Behavior: Appropriate  Motor: Normal  Speech/Language:  Normal Rate  Affect: Appropriate  Mood: normal  Thought process: normal  Thought content:   WNL  Sensory/Perceptual disturbances:   WNL  Orientation: oriented to person, place, time/date, and situation  Attention: Good  Concentration: Good  Memory: WNL  Fund of knowledge:  Good  Insight:   Good  Judgment:  Good  Impulse Control: Good   Risk Assessment: Danger to Self:  No Self-injurious Behavior: No Danger to Others: No Duty to Warn:no Physical Aggression / Violence:No  Access to Firearms a concern: No  Gang Involvement:No   Subjective: Pt present for face-to-face individual therapy via video.  Pt consents to telehealth video session and is aware of limitations and benefits of video sessions.  Location of pt: home Location of therapist: home office.   Pt talked about his trip to see his son [name].   It was a long trip but it went well.  Pt and [name] helped [name] get settled in a two bedroom apartment.  Pt took a lot of furniture for [employer].   Pt talked about work.  Work has been very busy.  He has several projects he is working on.  He has had to work some additional hours.  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   Pt is getting new pain medication that will hopefully help with the pain in his legs and feet.   Pt needs to have a colonoscopy and will be scheduling a pre-op appointment.  Pt is anxious about the colonoscopy.   Worked with pt on managing worry thoughts.    Worked on self care strategies. Provided supportive therapy.  Interventions: Cognitive Behavioral Therapy and Insight-Oriented  Diagnosis:  F33.1  Plan  of Care: Recommend ongoing therapy.   Plan to meet every two weeks.   Pt is progressing toward goals.    Treatment Plan  (Treatment Plan Target Date: 04/08/2024) Client Abilities/Strengths  Pt is bright, engaging, and motivated for therapy.   Client Treatment Preferences  Individual therapy.  Client Statement of Needs  Improve coping skills.  Symptoms  Depressed or irritable mood. Feelings of hopelessness, worthlessness, or inappropriate guilt. Low self-esteem.  Problems Addressed  Unipolar Depression Goals 1. Alleviate depressive symptoms and return to previous level of effective functioning. 2. Appropriately grieve the loss in order to normalize mood and to return to previously adaptive level of functioning. Objective Learn and implement behavioral strategies to overcome depression. Target Date: 2024-04-08 Frequency: Biweekly  Progress: 60 Modality: individual  Related Interventions Engage the client in "behavioral activation," increasing his/her activity level and contact with sources of reward, while identifying processes that inhibit activation.  Use behavioral techniques such as instruction, rehearsal, role-playing, role reversal, as needed, to facilitate activity in the client's daily life; reinforce success. Assist the client in developing skills that increase the likelihood of deriving pleasure from behavioral activation (e.g., assertiveness skills, developing an exercise plan, less internal/more external focus, increased social involvement); reinforce success. Objective Identify important people in life, past and present, and describe the quality, good and poor, of those relationships. Target Date: 2024-04-08 Frequency: Biweekly  Progress: 60 Modality: individual  Related Interventions Conduct Interpersonal Therapy beginning with  the assessment of the client's "interpersonal inventory" of important past and present relationships; develop a case formulation linking depression  to grief, interpersonal role disputes, role transitions, and/or interpersonal deficits). Objective Learn and implement problem-solving and decision-making skills. Target Date: 2024-04-08 Frequency: Biweekly  Progress: 60 Modality: individual  Related Interventions Conduct Problem-Solving Therapy using techniques such as psychoeducation, modeling, and role-playing to teach client problem-solving skills (i.e., defining a problem specifically, generating possible solutions, evaluating the pros and cons of each solution, selecting and implementing a plan of action, evaluating the efficacy of the plan, accepting or revising the plan); role-play application of the problem-solving skill to a real life issue. Encourage in the client the development of a positive problem orientation in which problems and solving them are viewed as a natural part of life and not something to be feared, despaired, or avoided. 3. Develop healthy interpersonal relationships that lead to the alleviation and help prevent the relapse of depression. 4. Develop healthy thinking patterns and beliefs about self, others, and the world that lead to the alleviation and help prevent the relapse of depression. 5. Recognize, accept, and cope with feelings of depression. Diagnosis F33.1  Conditions For Discharge Achievement of treatment goals and objectives   Salomon Fick, LCSW

## 2024-01-09 ENCOUNTER — Encounter: Payer: Self-pay | Admitting: Neurology

## 2024-01-10 NOTE — Assessment & Plan Note (Signed)
Is wheelchair dependent and follows closely with neurology and PT.

## 2024-01-10 NOTE — Assessment & Plan Note (Signed)
 Continues to follow with behavioral health. No change in meds

## 2024-01-10 NOTE — Assessment & Plan Note (Signed)
 hgba1c acceptable, minimize simple carbs. Increase exercise as tolerated.

## 2024-01-10 NOTE — Assessment & Plan Note (Signed)
 Well controlled, no changes to meds. Encouraged heart healthy diet such as the DASH diet and exercise as tolerated.

## 2024-01-10 NOTE — Assessment & Plan Note (Signed)
 On Levothyroxine, continue to monitor

## 2024-01-10 NOTE — Assessment & Plan Note (Signed)
 Encourage heart healthy diet such as MIND or DASH diet, increase exercise, avoid trans fats, simple carbohydrates and processed foods, consider a krill or fish or flaxseed oil cap daily. Tolerating Atorvastatin

## 2024-01-11 ENCOUNTER — Ambulatory Visit (INDEPENDENT_AMBULATORY_CARE_PROVIDER_SITE_OTHER): Payer: Managed Care, Other (non HMO) | Admitting: Family Medicine

## 2024-01-11 ENCOUNTER — Encounter: Payer: Self-pay | Admitting: Family Medicine

## 2024-01-11 ENCOUNTER — Telehealth: Payer: Self-pay

## 2024-01-11 VITALS — BP 118/64 | HR 95 | Temp 98.4°F | Resp 18 | Ht 75.0 in | Wt 320.0 lb

## 2024-01-11 DIAGNOSIS — F418 Other specified anxiety disorders: Secondary | ICD-10-CM

## 2024-01-11 DIAGNOSIS — Z23 Encounter for immunization: Secondary | ICD-10-CM | POA: Diagnosis not present

## 2024-01-11 DIAGNOSIS — E782 Mixed hyperlipidemia: Secondary | ICD-10-CM | POA: Diagnosis not present

## 2024-01-11 DIAGNOSIS — R739 Hyperglycemia, unspecified: Secondary | ICD-10-CM

## 2024-01-11 DIAGNOSIS — G4733 Obstructive sleep apnea (adult) (pediatric): Secondary | ICD-10-CM

## 2024-01-11 DIAGNOSIS — I10 Essential (primary) hypertension: Secondary | ICD-10-CM | POA: Diagnosis not present

## 2024-01-11 DIAGNOSIS — I252 Old myocardial infarction: Secondary | ICD-10-CM | POA: Insufficient documentation

## 2024-01-11 DIAGNOSIS — E038 Other specified hypothyroidism: Secondary | ICD-10-CM

## 2024-01-11 DIAGNOSIS — G35 Multiple sclerosis: Secondary | ICD-10-CM

## 2024-01-11 MED ORDER — OZEMPIC (0.25 OR 0.5 MG/DOSE) 2 MG/3ML ~~LOC~~ SOPN: 0.2500 mg | PEN_INJECTOR | SUBCUTANEOUS | 2 refills | Status: DC

## 2024-01-11 NOTE — Patient Instructions (Addendum)
[place] socks has compression socks  [employer] has courses and scholarships for students interested in FM and PC  RSV, Respiratory Syncitial Virus Vaccine, [facility]  Sarna anti itch lotion

## 2024-01-11 NOTE — Progress Notes (Addendum)
 Subjective:    Patient ID: Keith Morrow, male    DOB: 07/03/1971, 53 y.o.   MRN: [id number]  Chief Complaint  Patient presents with   Follow-up    HPI Discussed the use of AI scribe software for clinical note transcription with the patient, who gave verbal consent to proceed.  History of Present Illness   The patient, with a history of heart disease and sleep apnea, presents with concerns about decreasing hand strength, particularly in the right hand. The patient has been undergoing physical therapy to improve grip and arm strength, which is essential for daily activities such as lifting objects. The patient reports some progress in maintaining the ability to lift himself out of a chair and handle objects like a cup of coffee.  The patient also reports urinary issues, specifically nocturia, urinary urgency, and 'dribbling.' He has to urinate multiple times a night, which disrupts his sleep. The patient denies experiencing incontinence, dysuria, and hematuria.  The patient is on a regimen of medications, including hydromorphone administered via an intrathecal pump. The patient reports potential side effects from the medication, including localized itchiness and urinary hesitancy. The patient has found ways to manage these side effects, such as using a urinal and waiting for the urinary hesitancy to pass.  The patient also discusses his dietary habits, focusing on protein intake and hydration. He reports consuming about two quarts of liquid a day, spread throughout the day. The patient is also aware of the need to increase protein intake to maintain muscle mass.        Past Medical History:  Diagnosis Date   Acute bronchitis 05/09/2013   Allergy    Bronchitis, acute 12/10/2011   Candidal skin infection 05/05/2017   Chicken pox as a child   Constipation 02/20/2017   Coronary atherosclerosis of native coronary artery    a. NSTEMI 04/20/11: tx with Promus DES to LAD; bifurcation lesion with  oDx 90% tx with POBA; b. staged PCI of right post. AV Branch with Promus DES; c. 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   Visual changes 11/13/2016    Past Surgical History:  Procedure Laterality Date   CORONARY ANGIOPLASTY WITH STENT PLACEMENT     2012   PAIN PUMP IMPLANTATION N/A 08/29/2014   Procedure: Baclofen pump placement;  Surgeon: Maeola Harman, MD;  Location: [facility];  Service: Neurosurgery;  Laterality: N/A;  Baclofen pump placement   PAIN PUMP IMPLANTATION Right 09/20/2020   Procedure: Right Baclofen pump replacement;  Surgeon: Maeola Harman, MD;  Location: [facility];  Service: Neurosurgery;  Laterality: Right;  Right Baclofen pump replacement   PROGRAMABLE BACLOFEN PUMP REVISION  08/29/14   right wrist surgery     CTR   VASCULAR SURGERY     vericose vein in right femoral area  VASECTOMY     WISDOM TOOTH EXTRACTION      Family History  Problem Relation Age of Onset   Heart attack Mother 12   Thyroid disease Mother    Heart disease Mother    Thyroid disease Sister    Thyroid disease Brother    Heart disease Maternal Grandmother    Hypertension Maternal Grandmother    Hyperlipidemia Maternal Grandmother    Heart disease Maternal Grandfather    Hypertension Maternal Grandfather    Hyperlipidemia Maternal Grandfather    Stroke Paternal Grandfather    Thyroid disease Sister    Heart disease Father         2 stents   Coronary artery disease Other        questionable in father    Social History   Socioeconomic History   Marital status: Married    Spouse name: Not on file   Number of children: 3   Years of education: Not on file   Highest education level: Bachelor's degree (e.g., BA, AB, BS)  Occupational History   Not on file  Tobacco Use   Smoking status: Never   Smokeless tobacco: Never  Vaping Use   Vaping status: Never Used  Substance and Sexual Activity   Alcohol use: Yes    Comment: socially   Drug use: No   Sexual activity: Yes    Partners: Female    Comment: 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, works Engineer, maintenance, no dietary restrictions  Other Topics Concern   Not on file  Social History Narrative   Not on file   Social Drivers of Health   Financial Resource Strain: Low Risk  (01/09/2024)   Overall Financial Resource Strain (CARDIA)    Difficulty of Paying Living Expenses: Not very hard  Food Insecurity: No Food Insecurity (01/09/2024)   Hunger Vital Sign    Worried About Running Out of Food in the Last Year: Never true    Ran Out of Food in the Last Year: Never true  Transportation Needs: Unmet Transportation Needs (01/09/2024)   PRAPARE - Transportation    Lack of Transportation (Medical): No    Lack of Transportation (Non-Medical): Yes  Physical Activity: Unknown (01/09/2024)   Exercise Vital Sign    Days of Exercise per Week: 0 days    Minutes of Exercise per Session: Not on file  Stress: Stress Concern Present (01/09/2024)   [employer] of Occupational Health - Occupational Stress Questionnaire    Feeling of Stress : To some extent  Social Connections: Moderately Isolated (01/09/2024)   Social Connection and Isolation Panel [NHANES]    Frequency of Communication with Friends and Family: Never    Frequency of Social Gatherings with Friends and Family: Never    Attends Religious Services: 1 to 4 times per year    Active Member of [employer] or Organizations: No     Attends Engineer, structural: Not on file    Marital Status: Married  Catering manager Violence: Not on file    Outpatient Medications Prior to Visit  Medication Sig Dispense Refill   aspirin EC 81 MG tablet Take 81 mg by mouth daily.     atorvastatin (LIPITOR) 40 MG tablet Take 1 tablet (40 mg total) by mouth daily. 90 tablet 1   BACLOFEN IT 1 Dose by Intrathecal route as directed. Lioresal 1891mcg/459. daily, Clonidine 492mcg/109.65mcg daily/hydromorphone 461mcg/104.4mcg daily/baclofen 459.51mcg/29. daily     busPIRone (BUSPAR) 10 MG tablet Take 1 tablet (10 mg total) by mouth  in the morning, at noon, in the evening, and at bedtime. 360 tablet 3   clonazePAM (KLONOPIN) 1 MG tablet Take 1 tablet (1 mg total) by mouth 4 (four) times daily. 360 tablet 1   cyclobenzaprine (FLEXERIL) 10 MG tablet TAKE 1 TABLET BY MOUTH THREE TIMES A DAY AS NEEDED FOR MUSCLE SPASMS 90 tablet 11   CYMBALTA 60 MG capsule Take 1 capsule (60 mg total) by mouth 2 (two) times daily. 180 capsule 3   famotidine (PEPCID) 40 MG tablet TAKE 1 TABLET DAILY AT BEDTIME 90 tablet 3   Fe Fum-FA-B Cmp-C-Zn-Mg-Mn-Cu (HEMOCYTE-PLUS) 106-1 MG TABS Take time daily. 30 tablet 3   gabapentin (NEURONTIN) 600 MG tablet Take 1 tablet (600 mg total) by mouth 4 (four) times daily. 360 tablet 3   ketoconazole (NIZORAL) 2 % shampoo APPLY TOPICALLY 2 TIMES A WEEK 120 mL 5   levothyroxine (SYNTHROID) 137 MCG tablet TAKE 1 TABLET DAILY BEFORE BREAKFAST 90 tablet 3   methylphenidate (RITALIN) 10 MG tablet Take 1 tablet (10 mg total) by mouth 4 (four) times daily. 120 tablet 0   natalizumab (TYSABRI) 300 MG/15ML injection Inject 300 mg into the vein every 28 (twenty-eight) days.      nitroGLYCERIN (NITROSTAT) 0.4 MG SL tablet PLACE 1 TABLET UNDER THE TOUNGE EVERY 5 MINUTES AS NEEDED FOR CHEST PAIN (X 3 DOSES) 25 tablet 6   nystatin cream (MYCOSTATIN) Apply 1 application  topically as directed. 30 g 2    olmesartan-hydrochlorothiazide (BENICAR HCT) 20-12.5 MG tablet TAKE 1 TABLET DAILY 90 tablet 3   pantoprazole (PROTONIX) 40 MG tablet TAKE 1 TABLET BY MOUTH TWICE A DAY 60 tablet 2   potassium chloride SA (KLOR-CON M20) 20 MEQ tablet TAKE 1 TABLET BY MOUTH EVERY DAY AS NEEDED WHEN TAKING A SECOND LASIX TAB ANY GIVEN DAY 90 tablet 3   sucralfate (CARAFATE) 1 g tablet TAKE 1 TABLET FOUR TIMES A DAY 360 tablet 3   sucralfate (CARAFATE) 1 GM/10ML suspension Take 10 mLs (1 g total) by mouth 4 (four) times daily -  with meals and at bedtime. Take liquid or tablets but not both 420 mL 3   tamsulosin (FLOMAX) 0.4 MG CAPS capsule TAKE 1 CAPSULE DAILY 90 capsule 3   Clonidine HCl POWD by Does not apply route. 720mg /ml IT Baclofen. Total dose 157.89mg /day. Last pump refill 06/02/16     dalfampridine 10 MG TB12 TAKE 1 TABLET EVERY 12 HOURS 180 tablet 1   furosemide (LASIX) 20 MG tablet TAKE 1 TABLET (20 MG TOTAL) BY MOUTH DAILY X 5 DAYS, THEN AS NEEDED FOR FOR SHORTNESS OF BREATH/WEIGHT GAIN>3#, PEDAL EDEMA AND CAN TAKE A SECOND TAB DAILY AS NEEDED 180 tablet 0   Semaglutide-Weight Management (WEGOVY) 0.25 MG/0.5ML SOAJ Inject 0.25 mg into the skin once a week. (Patient not taking: Reported on 01/11/2024) 2 mL 1   No facility-administered medications prior to visit.    No Known Allergies  Review of Systems  Constitutional:  Negative for fever and malaise/fatigue.  HENT:  Negative for congestion.   Eyes:  Negative for blurred vision.  Respiratory:  Negative for shortness of breath.   Cardiovascular:  Negative for chest pain, palpitations and leg swelling.  Gastrointestinal:  Negative for abdominal pain, blood in stool and nausea.  Genitourinary:  Negative for dysuria and frequency.  Musculoskeletal:  Negative for falls.  Skin:  Negative for rash.  Neurological:  Negative for dizziness, loss of consciousness and headaches.  Endo/Heme/Allergies:  Negative for environmental allergies.  Psychiatric/Behavioral:  Negative for depression. The patient is not nervous/anxious.        Objective:    Physical Exam Vitals reviewed.  Constitutional:      Appearance: Normal appearance. He is not ill-appearing.  HENT:     Head: Normocephalic and atraumatic.     Nose: Nose normal.  Eyes:     Conjunctiva/sclera: Conjunctivae normal.  Cardiovascular:     Rate and Rhythm: Normal rate.     Pulses: Normal pulses.     Heart sounds: Normal heart sounds. No murmur heard. Pulmonary:     Effort: Pulmonary effort is normal.     Breath sounds: Normal breath sounds. No wheezing.  Abdominal:     Palpations: Abdomen is soft. There is no mass.     Tenderness: There is no abdominal tenderness.  Musculoskeletal:     Cervical back: Normal range of motion.     Right lower leg: No edema.     Left lower leg: No edema.  Skin:    General: Skin is warm and dry.  Neurological:     General: No focal deficit present.     Mental Status: He is alert and oriented to person, place, and time.  Psychiatric:        Mood and Affect: Mood normal.     BP 118/64 (BP Location: Left Arm, Patient Position: Sitting)   Pulse 95   Temp 98.4 F (36.9 C) (Oral)   Resp 18   Ht 6\' 3"  (1.905 m)   Wt (!) 320 lb (145.2 kg)   SpO2 97%   BMI 40.00 kg/m  Wt Readings from Last 3 Encounters:  01/11/24 (!) 320 lb (145.2 kg)  10/27/23 (!) 314 lb 6.4 oz (142.6 kg)  10/01/23 300 lb (136.1 kg)    Diabetic Foot Exam - Simple   No data filed    Lab Results  Component Value Date   WBC 8.5 01/11/2024   HGB 13.7 01/11/2024   HCT 40.7 01/11/2024   PLT 238.0 01/11/2024   GLUCOSE 96 01/11/2024   CHOL 134 01/11/2024   TRIG 61.0 01/11/2024   HDL 53.50 01/11/2024   LDLCALC 68 01/11/2024   ALT 23 01/11/2024   AST 19 01/11/2024   NA 138 01/11/2024   K 3.8 01/11/2024   CL 98 01/11/2024   CREATININE 1.01 01/11/2024   BUN 15 01/11/2024   CO2 31 01/11/2024   TSH 0.89 01/11/2024   PSA 0.52 02/11/2016   INR  1.00 04/21/2011   HGBA1C 5.7 01/11/2024    Lab Results  Component Value Date   TSH 0.89 01/11/2024   Lab Results  Component Value Date   WBC 8.5 01/11/2024   HGB 13.7 01/11/2024   HCT 40.7 01/11/2024   MCV 90.2 01/11/2024   PLT 238.0 01/11/2024   Lab Results  Component Value Date   NA 138 01/11/2024   K 3.8 01/11/2024   CO2 31 01/11/2024   GLUCOSE 96 01/11/2024   BUN 15 01/11/2024   CREATININE 1.01 01/11/2024   BILITOT 0.7 01/11/2024   ALKPHOS 96 01/11/2024   AST 19 01/11/2024   ALT 23 01/11/2024   PROT 6.8 01/11/2024   ALBUMIN 4.3 01/11/2024   CALCIUM 9.0 01/11/2024   ANIONGAP 10 04/21/2023   EGFR 105 04/02/2021   GFR 86.49 01/11/2024   Lab Results  Component Value Date   CHOL 134 01/11/2024   Lab Results  Component Value Date   HDL 53.50 01/11/2024   Lab Results  Component Value  Date   LDLCALC 68 01/11/2024   Lab Results  Component Value Date   TRIG 61.0 01/11/2024   Lab Results  Component Value Date   CHOLHDL 3 01/11/2024   Lab Results  Component Value Date   HGBA1C 5.7 01/11/2024       Assessment & Plan:  Depression with anxiety Assessment & Plan: Continues to follow with behavioral health. No change in meds   Primary hypertension Assessment & Plan: Well controlled, no changes to meds. Encouraged heart healthy diet such as the DASH diet and exercise as tolerated.   Orders: -     Comprehensive metabolic panel -     CBC with Differential/Platelet -     TSH  Hyperglycemia Assessment & Plan: hgba1c acceptable, minimize simple carbs. Increase exercise as tolerated.   Orders: -     Hemoglobin A1c  Mixed hyperlipidemia Assessment & Plan: Encourage heart healthy diet such as MIND or DASH diet, increase exercise, avoid trans fats, simple carbohydrates and processed foods, consider a krill or fish or flaxseed oil cap daily. Tolerating Atorvastatin  Orders: -     Lipid panel  Other specified hypothyroidism Assessment & Plan: On  Levothyroxine, continue to monitor   Multiple sclerosis (HCC) Assessment & Plan: Is wheelchair dependent and follows closely with neurology and PT.    OSA (obstructive sleep apnea) Assessment & Plan: Uses CPAP nightly  Orders: -     Ozempic (0.25 or 0.5 MG/DOSE); Inject 0.25 mg into the skin once a week.  Dispense: 3 mL; Refill: 2  Myocardial infarction, old -     Ozempic (0.25 or 0.5 MG/DOSE); Inject 0.25 mg into the skin once a week.  Dispense: 3 mL; Refill: 2  History of MI (myocardial infarction) Assessment & Plan: Will prescribe Ozempic to help manage his coronary artery disease and ongoing heart risk.    Need for shingles vaccine -     Varicella-zoster vaccine IM  Need for pneumococcal 20-valent conjugate vaccination -     Pneumococcal conjugate vaccine 20-valent    Assessment and Plan    Muscle Weakness Working on hand and arm strength due to difficulty in lifting self. Importance of protein and hydration emphasized for muscle maintenance. -Continue physical therapy for strength training. -Increase protein intake every 3-4 hours. -Ensure hydration of at least 80 ounces daily.  Peripheral Edema Noted fluid accumulation around ankles due to sedentary lifestyle, age, and possible vascular inefficiency. Discussed the role of protein in maintaining oncotic pressure and preventing fluid seepage into tissues. -Continue high protein diet to help manage swelling. -Consider use of compression socks. -Elevate feet when possible.  Chronic Pain Management Recently started on hydromorphone (Dilaudid) via pump for foot pain. Noted improvement but also reported localized itching and urinary hesitancy. -Continue hydromorphone as tolerated. -Consider over-the-counter anti-itch lotion (Sarna) for localized itching. -Monitor for worsening of urinary hesitancy or diffuse itching  Positive Cologuard Test Discussed potential false positives and the need for colonoscopy. -Await  scheduling of colonoscopy.  Vaccinations Due for Shingrix (shingles vaccine) and Prevnar 20 (pneumococcal vaccine). -Administer Shingrix and Prevnar 20 today.  Follow-up Biweekly meetings to be reduced to monthly. -Reduce meetings to once a month. -Order blood work (CBC, A1c). -Follow-up in 3-4 months.         [name], MD

## 2024-01-11 NOTE — Telephone Encounter (Signed)
PA initiated via Covermymeds; KEY: [id number]. Awaiting determination .

## 2024-01-11 NOTE — Assessment & Plan Note (Signed)
Uses CPAP nightly 

## 2024-01-12 ENCOUNTER — Other Ambulatory Visit: Payer: Self-pay | Admitting: Emergency Medicine

## 2024-01-12 ENCOUNTER — Other Ambulatory Visit: Payer: Self-pay | Admitting: *Deleted

## 2024-01-12 ENCOUNTER — Ambulatory Visit: Payer: Managed Care, Other (non HMO) | Admitting: Family Medicine

## 2024-01-12 ENCOUNTER — Encounter: Payer: Self-pay | Admitting: Family Medicine

## 2024-01-12 DIAGNOSIS — G35 Multiple sclerosis: Secondary | ICD-10-CM

## 2024-01-12 DIAGNOSIS — R269 Unspecified abnormalities of gait and mobility: Secondary | ICD-10-CM

## 2024-01-12 LAB — CBC WITH DIFFERENTIAL/PLATELET
Basophils Absolute: 0.1 10*3/uL (ref 0.0–0.1)
Basophils Relative: 0.9 % (ref 0.0–3.0)
Eosinophils Absolute: 0.7 10*3/uL (ref 0.0–0.7)
Eosinophils Relative: 7.9 % — ABNORMAL HIGH (ref 0.0–5.0)
HCT: 40.7 % (ref 39.0–52.0)
Hemoglobin: 13.7 g/dL (ref 13.0–17.0)
Lymphocytes Relative: 33 % (ref 12.0–46.0)
Lymphs Abs: 2.8 10*3/uL (ref 0.7–4.0)
MCHC: 33.6 g/dL (ref 30.0–36.0)
MCV: 90.2 fL (ref 78.0–100.0)
Monocytes Absolute: 0.5 10*3/uL (ref 0.1–1.0)
Monocytes Relative: 6.2 % (ref 3.0–12.0)
Neutro Abs: 4.4 10*3/uL (ref 1.4–7.7)
Neutrophils Relative %: 52 % (ref 43.0–77.0)
Platelets: 238 10*3/uL (ref 150.0–400.0)
RBC: 4.52 Mil/uL (ref 4.22–5.81)
RDW: 14.5 % (ref 11.5–15.5)
WBC: 8.5 10*3/uL (ref 4.0–10.5)

## 2024-01-12 LAB — LIPID PANEL
Cholesterol: 134 mg/dL (ref 0–200)
HDL: 53.5 mg/dL (ref >39.00)
LDL Cholesterol: 68 mg/dL (ref 0–99)
NonHDL: 80.66
Total CHOL/HDL Ratio: 3
Triglycerides: 61 mg/dL (ref 0.0–149.0)
VLDL: 12.2 mg/dL (ref 0.0–40.0)

## 2024-01-12 LAB — COMPREHENSIVE METABOLIC PANEL
ALT: 23 U/L (ref 0–53)
AST: 19 U/L (ref 0–37)
Albumin: 4.3 g/dL (ref 3.5–5.2)
Alkaline Phosphatase: 96 U/L (ref 39–117)
BUN: 15 mg/dL (ref 6–23)
CO2: 31 meq/L (ref 19–32)
Calcium: 9 mg/dL (ref 8.4–10.5)
Chloride: 98 meq/L (ref 96–112)
Creatinine, Ser: 1.01 mg/dL (ref 0.40–1.50)
GFR: 86.49 mL/min (ref >60.00)
Glucose, Bld: 96 mg/dL (ref 70–99)
Potassium: 3.8 meq/L (ref 3.5–5.1)
Sodium: 138 meq/L (ref 135–145)
Total Bilirubin: 0.7 mg/dL (ref 0.2–1.2)
Total Protein: 6.8 g/dL (ref 6.0–8.3)

## 2024-01-12 LAB — TSH: TSH: 0.89 u[IU]/mL (ref 0.35–5.50)

## 2024-01-12 LAB — HEMOGLOBIN A1C: Hgb A1c MFr Bld: 5.7 % (ref 4.6–6.5)

## 2024-01-12 MED ORDER — DALFAMPRIDINE ER 10 MG PO TB12: 10.0000 mg | ORAL_TABLET | Freq: Two times a day (BID) | ORAL | 1 refills | Status: DC

## 2024-01-12 MED ORDER — FUROSEMIDE 20 MG PO TABS: ORAL_TABLET | ORAL | 3 refills | Status: DC

## 2024-01-13 ENCOUNTER — Other Ambulatory Visit: Payer: Self-pay | Admitting: *Deleted

## 2024-01-15 ENCOUNTER — Encounter: Payer: Self-pay | Admitting: Family Medicine

## 2024-01-18 NOTE — Telephone Encounter (Signed)
 PA denied. PA was ran for Atherosclerotic heart disease   Coverage is provided for the diagnosis of diabetes mellitus type 2. Coverage cannot be  authorized at this time.

## 2024-01-18 NOTE — Telephone Encounter (Signed)
 Called patient and let tem know PA had been denied

## 2024-01-20 ENCOUNTER — Telehealth: Payer: Self-pay | Admitting: *Deleted

## 2024-01-20 NOTE — Telephone Encounter (Signed)
Faxed completed/signed form below back to [facility] at [phone]. Received fax confirmation.

## 2024-01-21 ENCOUNTER — Ambulatory Visit: Payer: 59 | Admitting: Psychology

## 2024-01-21 DIAGNOSIS — F331 Major depressive disorder, recurrent, moderate: Secondary | ICD-10-CM | POA: Diagnosis not present

## 2024-01-21 NOTE — Progress Notes (Signed)
[facility] Counselor/Therapist Progress Note  Patient ID: TERY HOEGER, MRN: [id number],    Date: 01/21/2024  Time Spent: 2:00pm-2:50pm   50 minutes   Treatment Type: Individual Therapy  Reported Symptoms: stress  Mental Status Exam: Appearance:  Casual     Behavior: Appropriate  Motor: Normal  Speech/Language:  Normal Rate  Affect: Appropriate  Mood: normal  Thought process: normal  Thought content:   WNL  Sensory/Perceptual disturbances:   WNL  Orientation: oriented to person, place, time/date, and situation  Attention: Good  Concentration: Good  Memory: WNL  Fund of knowledge:  Good  Insight:   Good  Judgment:  Good  Impulse Control: Good   Risk Assessment: Danger to Self:  No Self-injurious Behavior: No Danger to Others: No Duty to Warn:no Physical Aggression / Violence:No  Access to Firearms a concern: No  Gang Involvement:No   Subjective: Pt present for face-to-face individual therapy via video.  Pt consents to telehealth video session and is aware of limitations and benefits of video sessions.  Location of pt: home Location of therapist: home office.   Pt talked about his health.  He is having increased MS symptoms.  Pt can't bend his legs.  He has contacted his provider who is going to adjust pt's medications.  Pt's MS issues have been painful and have made ADL's more difficult bc his body is so rigid and it is harder to move.  Pt has not been able to go to his work office bc of the limitations. Pt has felt down bc of his health issues.  He has realized if the medications don't help him he could have to apply for disability.  Pt may also consider asking for the accommodation to work remotely 100% of the time.  Pt does not like asking for help.   Addressed these issues.  Pt talked about being disappointed that [name] did not score high enough on the MCAT to be able to apply for medical school.  Worked on self care strategies. Provided supportive  therapy.  Interventions: Cognitive Behavioral Therapy and Insight-Oriented  Diagnosis:  F33.1  Plan of Care: Recommend ongoing therapy.   Plan to meet every two weeks.   Pt is progressing toward goals.    Treatment Plan  (Treatment Plan Target Date: 04/08/2024) Client Abilities/Strengths  Pt is bright, engaging, and motivated for therapy.   Client Treatment Preferences  Individual therapy.  Client Statement of Needs  Improve coping skills.  Symptoms  Depressed or irritable mood. Feelings of hopelessness, worthlessness, or inappropriate guilt. Low self-esteem.  Problems Addressed  Unipolar Depression Goals 1. Alleviate depressive symptoms and return to previous level of effective functioning. 2. Appropriately grieve the loss in order to normalize mood and to return to previously adaptive level of functioning. Objective Learn and implement behavioral strategies to overcome depression. Target Date: 2024-04-08 Frequency: Biweekly  Progress: 60 Modality: individual  Related Interventions Engage the client in "behavioral activation," increasing his/her activity level and contact with sources of reward, while identifying processes that inhibit activation.  Use behavioral techniques such as instruction, rehearsal, role-playing, role reversal, as needed, to facilitate activity in the client's daily life; reinforce success. Assist the client in developing skills that increase the likelihood of deriving pleasure from behavioral activation (e.g., assertiveness skills, developing an exercise plan, less internal/more external focus, increased social involvement); reinforce success. Objective Identify important people in life, past and present, and describe the quality, good and poor, of those relationships. Target Date: 2024-04-08 Frequency: Biweekly  Progress: 60 Modality: individual  Related Interventions Conduct Interpersonal Therapy beginning with the assessment of the client's  "interpersonal inventory" of important past and present relationships; develop a case formulation linking depression to grief, interpersonal role disputes, role transitions, and/or interpersonal deficits). Objective Learn and implement problem-solving and decision-making skills. Target Date: 2024-04-08 Frequency: Biweekly  Progress: 60 Modality: individual  Related Interventions Conduct Problem-Solving Therapy using techniques such as psychoeducation, modeling, and role-playing to teach client problem-solving skills (i.e., defining a problem specifically, generating possible solutions, evaluating the pros and cons of each solution, selecting and implementing a plan of action, evaluating the efficacy of the plan, accepting or revising the plan); role-play application of the problem-solving skill to a real life issue. Encourage in the client the development of a positive problem orientation in which problems and solving them are viewed as a natural part of life and not something to be feared, despaired, or avoided. 3. Develop healthy interpersonal relationships that lead to the alleviation and help prevent the relapse of depression. 4. Develop healthy thinking patterns and beliefs about self, others, and the world that lead to the alleviation and help prevent the relapse of depression. 5. Recognize, accept, and cope with feelings of depression. Diagnosis F33.1  Conditions For Discharge Achievement of treatment goals and objectives   Salomon Fick, LCSW

## 2024-01-26 NOTE — Assessment & Plan Note (Signed)
 Will prescribe Ozempic to help manage his coronary artery disease and ongoing heart risk.

## 2024-01-28 ENCOUNTER — Telehealth: Payer: Self-pay | Admitting: *Deleted

## 2024-01-28 NOTE — Telephone Encounter (Signed)
 Marland Kitchen

## 2024-01-28 NOTE — Telephone Encounter (Signed)
 Prior auth started via cover my meds.  Awaiting determination.  Key: [id number]

## 2024-02-02 ENCOUNTER — Encounter: Payer: Self-pay | Admitting: Neurology

## 2024-02-02 NOTE — Telephone Encounter (Signed)
 Called [facility] at [phone], option 3 (pharmacy). Asked that they increase pt baclofen from 500 to 535 mcg/day (7% increase). States increases being handled by monitoring center nurses. Transferred me and I spoke w/ [facility]. She took VO and will get this updated for pt.

## 2024-02-02 NOTE — Telephone Encounter (Addendum)
Faxed signed order back to [facility] at [phone]. Received fax confirmation.

## 2024-02-04 ENCOUNTER — Ambulatory Visit: Payer: 59 | Admitting: Psychology

## 2024-02-04 ENCOUNTER — Telehealth: Payer: Self-pay | Admitting: *Deleted

## 2024-02-04 DIAGNOSIS — F331 Major depressive disorder, recurrent, moderate: Secondary | ICD-10-CM

## 2024-02-04 NOTE — Progress Notes (Signed)
 [facility] Counselor/Therapist Progress Note  Patient ID: Keith Morrow, MRN: [id number],    Date: 02/04/2024  Time Spent: 11:00am-11:50am   50 minutes   Treatment Type: Individual Therapy  Reported Symptoms: stress  Mental Status Exam: Appearance:  Casual     Behavior: Appropriate  Motor: Normal  Speech/Language:  Normal Rate  Affect: Appropriate  Mood: normal  Thought process: normal  Thought content:   WNL  Sensory/Perceptual disturbances:   WNL  Orientation: oriented to person, place, time/date, and situation  Attention: Good  Concentration: Good  Memory: WNL  Fund of knowledge:  Good  Insight:   Good  Judgment:  Good  Impulse Control: Good   Risk Assessment: Danger to Self:  No Self-injurious Behavior: No Danger to Others: No Duty to Warn:no Physical Aggression / Violence:No  Access to Firearms a concern: No  Gang Involvement:No   Subjective: Pt present for face-to-face individual therapy via video.  Pt consents to telehealth video session and is aware of limitations and benefits of video sessions.  Location of pt: home Location of therapist: home office.   Pt talked about his health.  Pt has had more leg issues the past 3 weeks.  He is working with his doctors on medication adjustments.  Pt has had increased pain.  It has disrupted his sleep.   Pt talked about work.  He has been at the office most of the week.   Work has been very busy.  Pt has been affirmed at work about his value.   Worked with pt on how to internalize the positive feedback.  Pt talked about [name] getting an MRI today bc he has tenitis.   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  Pt is a little upset about this. Worked on self care strategies. Provided supportive therapy.  Interventions: Cognitive Behavioral Therapy and Insight-Oriented  Diagnosis:  F33.1  Plan of Care: Recommend ongoing therapy.   Plan to meet every two  weeks.   Pt is progressing toward goals.    Treatment Plan  (Treatment Plan Target Date: 04/08/2024) Client Abilities/Strengths  Pt is bright, engaging, and motivated for therapy.   Client Treatment Preferences  Individual therapy.  Client Statement of Needs  Improve coping skills.  Symptoms  Depressed or irritable mood. Feelings of hopelessness, worthlessness, or inappropriate guilt. Low self-esteem.  Problems Addressed  Unipolar Depression Goals 1. Alleviate depressive symptoms and return to previous level of effective functioning. 2. Appropriately grieve the loss in order to normalize mood and to return to previously adaptive level of functioning. Objective Learn and implement behavioral strategies to overcome depression. Target Date: 2024-04-08 Frequency: Biweekly  Progress: 60 Modality: individual  Related Interventions Engage the client in "behavioral activation," increasing his/her activity level and contact with sources of reward, while identifying processes that inhibit activation.  Use behavioral techniques such as instruction, rehearsal, role-playing, role reversal, as needed, to facilitate activity in the client's daily life; reinforce success. Assist the client in developing skills that increase the likelihood of deriving pleasure from behavioral activation (e.g., assertiveness skills, developing an exercise plan, less internal/more external focus, increased social involvement); reinforce success. Objective Identify important people in life, past and present, and describe the quality, good and poor, of those relationships. Target Date: 2024-04-08 Frequency: Biweekly  Progress: 60 Modality: individual  Related Interventions Conduct Interpersonal Therapy beginning with the assessment of the client's "interpersonal inventory" of important past and present relationships; develop a case formulation  linking depression to grief, interpersonal role disputes, role transitions,  and/or interpersonal deficits). Objective Learn and implement problem-solving and decision-making skills. Target Date: 2024-04-08 Frequency: Biweekly  Progress: 60 Modality: individual  Related Interventions Conduct Problem-Solving Therapy using techniques such as psychoeducation, modeling, and role-playing to teach client problem-solving skills (i.e., defining a problem specifically, generating possible solutions, evaluating the pros and cons of each solution, selecting and implementing a plan of action, evaluating the efficacy of the plan, accepting or revising the plan); role-play application of the problem-solving skill to a real life issue. Encourage in the client the development of a positive problem orientation in which problems and solving them are viewed as a natural part of life and not something to be feared, despaired, or avoided. 3. Develop healthy interpersonal relationships that lead to the alleviation and help prevent the relapse of depression. 4. Develop healthy thinking patterns and beliefs about self, others, and the world that lead to the alleviation and help prevent the relapse of depression. 5. Recognize, accept, and cope with feelings of depression. Diagnosis F33.1  Conditions For Discharge Achievement of treatment goals and objectives   Salomon Fick, LCSW

## 2024-02-04 NOTE — Telephone Encounter (Signed)
 Spoke with pharmacist Gillian Shields, peer to peer.  She stated that patients plan follows strictly that Ozempic must be for diabetes.  He should be able to do anti-obesity medications like Zepbound or Wegovy since he has a BMI above 32 and following a diet plan.  Per [name] can try [facility] or Zebound.

## 2024-02-04 NOTE — Telephone Encounter (Signed)
 Spoke with [employer] about scheduling peer to peer appointment.  They will be calling back 02/04/24 at 2pm  E2C2- Please warm transfer to office.

## 2024-02-04 NOTE — Telephone Encounter (Signed)
 Peer to peer scheduled.  They denied second prior auth

## 2024-02-05 NOTE — Telephone Encounter (Signed)
 Tried to do prior [place] on Wegovy and [employer] stated that patient would have to pay 100% for medication.  They stated that we can try and do an appeal.  We will try and do appeal.

## 2024-02-11 ENCOUNTER — Telehealth: Payer: Self-pay | Admitting: *Deleted

## 2024-02-11 NOTE — Telephone Encounter (Signed)
 Opened in error

## 2024-02-11 NOTE — Telephone Encounter (Signed)
 Patient that he gave a physical form and would like to know status.  Spoke with [name] and she thinks she may have filled it out. Do you know where it is?

## 2024-02-11 NOTE — Telephone Encounter (Signed)
 Spoke with insurance again.  Patient will have to pay a 100% of a discounted price of $768.94.  Member can talk to their employer to upgrade plan to cover obesity drugs.  Pt notified.

## 2024-02-11 NOTE — Telephone Encounter (Signed)
 Called and spoke with patient. He provided his email address and I scanned the form to him

## 2024-02-19 ENCOUNTER — Ambulatory Visit: Payer: 59 | Admitting: Psychology

## 2024-03-01 ENCOUNTER — Telehealth: Payer: Self-pay | Admitting: *Deleted

## 2024-03-01 NOTE — Telephone Encounter (Signed)
 Faxed signed order below back to [facility] at [phone]. Received fax confirmation.

## 2024-03-03 ENCOUNTER — Ambulatory Visit: Payer: 59 | Admitting: Psychology

## 2024-03-03 DIAGNOSIS — F331 Major depressive disorder, recurrent, moderate: Secondary | ICD-10-CM

## 2024-03-03 NOTE — Progress Notes (Signed)
 [facility] Counselor/Therapist Progress Note  Patient ID: Keith Morrow, MRN: [id number],    Date: 03/03/2024  Time Spent: 11:00am-11:50am   50 minutes   Treatment Type: Individual Therapy  Reported Symptoms: stress  Mental Status Exam: Appearance:  Casual     Behavior: Appropriate  Motor: Normal  Speech/Language:  Normal Rate  Affect: Appropriate  Mood: normal  Thought process: normal  Thought content:   WNL  Sensory/Perceptual disturbances:   WNL  Orientation: oriented to person, place, time/date, and situation  Attention: Good  Concentration: Good  Memory: WNL  Fund of knowledge:  Good  Insight:   Good  Judgment:  Good  Impulse Control: Good   Risk Assessment: Danger to Self:  No Self-injurious Behavior: No Danger to Others: No Duty to Warn:no Physical Aggression / Violence:No  Access to Firearms a concern: No  Gang Involvement:No   Subjective: Pt present for face-to-face individual therapy via video.  Pt consents to telehealth video session and is aware of limitations and benefits of video sessions.  Location of pt: home Location of therapist: home office.   Pt talked about his health.  Pt has had more leg issues and he had trouble getting on and off the toilet in the office at work and home.  This is worrisome and upsetting for pt.  He is upset that his leg strength has diminished.   Pt has had increased pain and stiffness.   Pt is having more trouble with transferring.  Pt has also had trouble getting up and down the ramp in his house.   Pt has trouble accepting an increase in physical limitations.   Helped pt process his feelings and worked on acceptance issues.   Pt talked about work.   Work has been very busy.     Pt also has not had as much sleep this week bc of increased work hours.  A couple of days he was at work from 6:30am-9:30pm.   Addressed pt's work stress.  Pt talked about concerns about his weight.  He wants to lose weight so it will be  easier for people to help him when he needs help transferring from the wheelchair.  Gave pt referral information about [facility] and [facility].   Worked on self care strategies. Provided supportive therapy.  Interventions: Cognitive Behavioral Therapy and Insight-Oriented  Diagnosis:  F33.1  Plan of Care: Recommend ongoing therapy.   Plan to meet every two weeks.   Pt is progressing toward goals.    Treatment Plan  (Treatment Plan Target Date: 04/08/2024) Client Abilities/Strengths  Pt is bright, engaging, and motivated for therapy.   Client Treatment Preferences  Individual therapy.  Client Statement of Needs  Improve coping skills.  Symptoms  Depressed or irritable mood. Feelings of hopelessness, worthlessness, or inappropriate guilt. Low self-esteem.  Problems Addressed  Unipolar Depression Goals 1. Alleviate depressive symptoms and return to previous level of effective functioning. 2. Appropriately grieve the loss in order to normalize mood and to return to previously adaptive level of functioning. Objective Learn and implement behavioral strategies to overcome depression. Target Date: 2024-04-08 Frequency: Biweekly  Progress: 60 Modality: individual  Related Interventions Engage the client in "behavioral activation," increasing his/her activity level and contact with sources of reward, while identifying processes that inhibit activation.  Use behavioral techniques such as instruction, rehearsal, role-playing, role reversal, as needed, to facilitate activity in the client's daily life; reinforce success. Assist the client in developing skills  that increase the likelihood of deriving pleasure from behavioral activation (e.g., assertiveness skills, developing an exercise plan, less internal/more external focus, increased social involvement); reinforce success. Objective Identify important people in life, past and present, and describe  the quality, good and poor, of those relationships. Target Date: 2024-04-08 Frequency: Biweekly  Progress: 60 Modality: individual  Related Interventions Conduct Interpersonal Therapy beginning with the assessment of the client's "interpersonal inventory" of important past and present relationships; develop a case formulation linking depression to grief, interpersonal role disputes, role transitions, and/or interpersonal deficits). Objective Learn and implement problem-solving and decision-making skills. Target Date: 2024-04-08 Frequency: Biweekly  Progress: 60 Modality: individual  Related Interventions Conduct Problem-Solving Therapy using techniques such as psychoeducation, modeling, and role-playing to teach client problem-solving skills (i.e., defining a problem specifically, generating possible solutions, evaluating the pros and cons of each solution, selecting and implementing a plan of action, evaluating the efficacy of the plan, accepting or revising the plan); role-play application of the problem-solving skill to a real life issue. Encourage in the client the development of a positive problem orientation in which problems and solving them are viewed as a natural part of life and not something to be feared, despaired, or avoided. 3. Develop healthy interpersonal relationships that lead to the alleviation and help prevent the relapse of depression. 4. Develop healthy thinking patterns and beliefs about self, others, and the world that lead to the alleviation and help prevent the relapse of depression. 5. Recognize, accept, and cope with feelings of depression. Diagnosis F33.1  Conditions For Discharge Achievement of treatment goals and objectives   Salomon Fick, LCSW

## 2024-03-07 ENCOUNTER — Other Ambulatory Visit: Payer: Self-pay | Admitting: Neurology

## 2024-03-07 MED ORDER — METHYLPHENIDATE HCL 10 MG PO TABS: 10.0000 mg | ORAL_TABLET | Freq: Four times a day (QID) | ORAL | 0 refills | Status: DC

## 2024-03-17 ENCOUNTER — Ambulatory Visit: Payer: 59 | Admitting: Psychology

## 2024-03-21 ENCOUNTER — Other Ambulatory Visit: Payer: Self-pay | Admitting: Family Medicine

## 2024-03-29 ENCOUNTER — Ambulatory Visit (INDEPENDENT_AMBULATORY_CARE_PROVIDER_SITE_OTHER): Payer: Managed Care, Other (non HMO) | Admitting: Neurology

## 2024-03-29 ENCOUNTER — Telehealth: Payer: Self-pay | Admitting: *Deleted

## 2024-03-29 ENCOUNTER — Encounter: Payer: Self-pay | Admitting: Neurology

## 2024-03-29 VITALS — BP 112/71 | HR 76 | Ht 75.0 in | Wt 350.0 lb

## 2024-03-29 DIAGNOSIS — G35 Multiple sclerosis: Secondary | ICD-10-CM | POA: Diagnosis not present

## 2024-03-29 DIAGNOSIS — R3915 Urgency of urination: Secondary | ICD-10-CM

## 2024-03-29 DIAGNOSIS — R269 Unspecified abnormalities of gait and mobility: Secondary | ICD-10-CM

## 2024-03-29 DIAGNOSIS — Z79899 Other long term (current) drug therapy: Secondary | ICD-10-CM | POA: Diagnosis not present

## 2024-03-29 DIAGNOSIS — R208 Other disturbances of skin sensation: Secondary | ICD-10-CM | POA: Diagnosis not present

## 2024-03-29 DIAGNOSIS — Z95828 Presence of other vascular implants and grafts: Secondary | ICD-10-CM | POA: Diagnosis not present

## 2024-03-29 DIAGNOSIS — G801 Spastic diplegic cerebral palsy: Secondary | ICD-10-CM | POA: Diagnosis not present

## 2024-03-29 MED ORDER — METHYLPHENIDATE HCL 10 MG PO TABS: 10.0000 mg | ORAL_TABLET | Freq: Four times a day (QID) | ORAL | 0 refills | Status: DC

## 2024-03-29 MED ORDER — GABAPENTIN 600 MG PO TABS: 600.0000 mg | ORAL_TABLET | Freq: Four times a day (QID) | ORAL | 3 refills | Status: AC

## 2024-03-29 NOTE — Telephone Encounter (Signed)
 Placed JCV lab in [facility] lock box for routine lab pick up. Results pending.

## 2024-03-29 NOTE — Progress Notes (Signed)
          Faxed updated session report to [facility] at [phone]. Also asked for following changes at next pump refill per MD request: Change hydromorphone  to 524mcg/ml, continue clonidine 420.0mcg/ml and baclofen  1800mcg/ml.  Make baclofen  primary drug Received fax confirmation.

## 2024-03-29 NOTE — Progress Notes (Signed)
 [name]     HPI     Follow-up    Additional comments: Pt in room 11. Alone. Here for MS follow up. DMT: Tysabri. Pt reports MS hug in stomach area has gotten higher. Pt reports he is too weak to stand up. Feet still sore from pushing down on foot rest on wheelchair, fatigue.       Last edited by Fredi January, RMA on 03/29/2024  3:58 PM.       Chief Complaint   Follow-up     HISTORY:  Keith Morrow is a 53 y.o. man with a relapsing form of multiple sclerosis who has right greater than left leg weakness and progressive difficulties with gait function.  Due to severe spasticity, a baclofen  pump was placed in 2013 and replaced 09/2020.  Update 03/29/2024: He is on Tysabri and tolerates it well.   No exacerbations.    He has a baclofen  pump.  He is now using [facility] to fill the pump.  He takes a bolus at nght with benefit.    Clonazepam  has helped the spasticity in the arms and torso.    His main problem is weakness and spasticity.   He often feels too tight in the mornings and too loose in the afternoons.  When this happens, he can't transfer.    His feet sometimes hurt as the day goes on.   The hydromorphone  helped this more initially but this is less effective recently  Fatigue is worse and he often takes ritalin  20-10-10,     He cannot use a walker now.  Transferring has been more difficult.  He notes more stiffness in his right hand.    He has had a few falls, last one a few days ago when he leaned over to grab something.   He did not hit his head.   He spends most of the day in the chair.    He occasionally to exercise some with squats in the morning holding on for support.   He has an Oncologist (moves for hm and helps spasticity some).  He has hand controls on his [employer].  He is more hesitant to go further away.     Due to slow progression of weakness and reduced coordination in the right arm/hand.  He now writes and types with the left hand and just uses the right hand to  operate a mouse.    Sensation is generally the same.     Vision is fine.   Bladder function is fine on tamsulosin .  No UTI.      He gets some back and flank pain while driving.    The bottom of his feet hurt more as the day goes on.     Ritalin  helps the MS related ADD/cgnitive/fatigue issues.    He sleeps well at night.  He sleeps better with CPAP.   Mood is doing reasonably well.     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       MS history:  In 1997, he had left optic neuritis and then had numbness in both feet. MRI was consistent with MS and a lumbar puncture was also performed and CSF was consistent with MS. Initially, he was placed on Copaxone. He had difficulties with compliance and 5 years later switched to Avonex. Unfortunate, he had difficulties tolerating Avonex and also had difficulty tolerating Rebif. In 2004, he started Tysabri but was only able to take 2 doses before was taken  off the market. He restarted in 2005 and has been on Tysabri since then with the exception of a 6 month holiday. Tolerates Tysabri well. He has not had any definite exacerbations though his gait has worsened over the past few years. He is JCV antibody negative.   He had recent MRI of the brain and spine and there were no changes.      Spasticity history: He was experiencing progressive spasticity in both legs.  He got a baclofen  pump in 2013.  Prior to the pump he was having severe tonic spasticity as well as frequent phasic spasms.  These improved.  Due to the spasticity, baclofen  was increased in 2016 and 2017 but he became weaker.  I began to see him again and we added clonidine to the baclofen  and cut his dose and added nighttime boluses.  He did better with those settings.  Imaging: MRI of the cervical spine dated 07/25/2020 and  MRI of the cervical spine from 07/05/2015.  Both showed multiple T2 hyperintense foci within the spinal cord.  They are located posterolaterally to the right below the  cervicomedullary junction, anteriorly adjacent to C3C4,  posterolaterally to the left adjacent to C4, posterior adjacent to C4-C5, both the left and the right adjacent to C4-C5, very large right focus adjacent to C5, posterior laterally to the left adjacent to C6, towards the right adjacent to C6 and posterior laterally to the right adjacent to C7-T1.  None of the foci enhance.  There did not appear to be any new lesions between 2016 and 2021.   There is a right disc protrusion causing mild to moderate right foraminal narrowing.  At C5-C6, there is a right disc protrusion but no nerve root compression  The MRI of the brain from 07/25/2020 is unchanged compared to the 12/19/2015 MRI according to the official report.  Most foci are supratentorial but somewhat noted in the pons.    There are no enhancing lesions.    PHYSICAL EXAM  Today's Vitals   03/29/24 1556  BP: 112/71  Pulse: 76  Weight: (!) 350 lb (158.8 kg)  Height: 6\' 3"  (1.905 m)   Body mass index is 43.75 kg/m.   General: The patient is well-developed and well-nourished and in no acute distress.  Extremities:   There are no rashes or edema.    Neurologic Exam  Mental status: The patient is alert and oriented x 3 at the time of the examination.  Good focus/attention.    Speech is normal.  CN:   Extraocular muscles are normal.  No ptosis.    Symmetric facial strength and sensation.    PE/TP midline  Motor:  Muscle bulk is normal.  tone is increased in the right arm and minimally increased in the legs.  Strength is 4+/5 in the right arm, 5/5 in the left arm, 2-2+/5 in the right leg and 2+-3/5 in the left leg .   Reduced RAM in right hand  Sensation: He had symmetric sensation to touch and vibration today.  Gait and station: He is wheelchair-bound  Reflexes: Deep tendon reflexes are trace at the left knee and trace at the right knee.   No ankle clonus  ASSESSMENT  Multiple sclerosis (HCC) - Plan: Stratify JCV Antibody Test  ([facility]), CBC with Differential/Platelet  High risk medication use - Plan: Stratify JCV Antibody Test ([facility]), CBC with Differential/Platelet  Gait disturbance  Spastic diplegia (HCC)  Urinary urgency  Presence of implanted infusion pump   PLAN: 1.  Continue Tysabri every 4 weeks (uses [facility]).   Check JCV antibody and CBC today.  He has been JCV negative. 2.   He will continue with the intrathecal pump.  This will be refilled with [facility]: 1800 mcg/mL baclofen  and 420 mcg/mL clonidine and hydromorphone  (currently 400 mcg/mL and we will increase to 500 mcg to mL).    We will add a bolus at 5 AM and programmed the bolus at 9 PM.  To avoid him becoming weaker I will also slightly reduce the 4-hour infusion rate.  With these to be changes the total 24-hour baclofen  dose will be about 1% higher than it is currently  3.   Continue other med's   .  Refills will be sent in. 4.   Return in 6 months or sooner if there are new or worsening neurologic issues.  We can make further adjustments if needed before the next refill     Crystina Borrayo A. Godwin Lat, MD, PhD, FAAN Certified in Neurology, Clinical Neurophysiology, Sleep Medicine, Pain Medicine and Neuroimaging Director, [facility] at [facility]  [facility] [address] [place], [place] [address] [phone]

## 2024-03-30 ENCOUNTER — Telehealth: Payer: Self-pay | Admitting: *Deleted

## 2024-03-30 LAB — CBC WITH DIFFERENTIAL/PLATELET
Basophils Absolute: 0.1 10*3/uL (ref 0.0–0.2)
Basos: 1 %
EOS (ABSOLUTE): 0.7 10*3/uL — ABNORMAL HIGH (ref 0.0–0.4)
Eos: 8 %
Hematocrit: 38.3 % (ref 37.5–51.0)
Hemoglobin: 12.8 g/dL — ABNORMAL LOW (ref 13.0–17.7)
Immature Grans (Abs): 0.1 10*3/uL (ref 0.0–0.1)
Immature Granulocytes: 1 %
Lymphocytes Absolute: 2.3 10*3/uL (ref 0.7–3.1)
Lymphs: 30 %
MCH: 29.7 pg (ref 26.6–33.0)
MCHC: 33.4 g/dL (ref 31.5–35.7)
MCV: 89 fL (ref 79–97)
Monocytes Absolute: 0.6 10*3/uL (ref 0.1–0.9)
Monocytes: 8 %
Neutrophils Absolute: 4.1 10*3/uL (ref 1.4–7.0)
Neutrophils: 52 %
Platelets: 200 10*3/uL (ref 150–450)
RBC: 4.31 x10E6/uL (ref 4.14–5.80)
RDW: 13.1 % (ref 11.6–15.4)
WBC: 7.9 10*3/uL (ref 3.4–10.8)

## 2024-03-30 NOTE — Telephone Encounter (Signed)
 Faxed signed order below to [facility]. Received fax confirmation.

## 2024-03-31 ENCOUNTER — Other Ambulatory Visit: Payer: Self-pay | Admitting: Family Medicine

## 2024-03-31 ENCOUNTER — Ambulatory Visit: Payer: 59 | Admitting: Psychology

## 2024-03-31 ENCOUNTER — Other Ambulatory Visit: Payer: Self-pay | Admitting: Family

## 2024-03-31 DIAGNOSIS — E039 Hypothyroidism, unspecified: Secondary | ICD-10-CM

## 2024-04-03 ENCOUNTER — Other Ambulatory Visit: Payer: Self-pay | Admitting: Gastroenterology

## 2024-04-03 DIAGNOSIS — K219 Gastro-esophageal reflux disease without esophagitis: Secondary | ICD-10-CM

## 2024-04-04 ENCOUNTER — Ambulatory Visit (INDEPENDENT_AMBULATORY_CARE_PROVIDER_SITE_OTHER): Admitting: Psychology

## 2024-04-04 DIAGNOSIS — F331 Major depressive disorder, recurrent, moderate: Secondary | ICD-10-CM | POA: Diagnosis not present

## 2024-04-04 NOTE — Progress Notes (Signed)
 [facility] Counselor/Therapist Progress Note  Patient ID: AARAN RUNDE, MRN: [id number],    Date: 04/04/2024  Time Spent: 11:00am-11:50am   50 minutes   Treatment Type: Individual Therapy  Reported Symptoms: stress  Mental Status Exam: Appearance:  Casual     Behavior: Appropriate  Motor: Normal  Speech/Language:  Normal Rate  Affect: Appropriate  Mood: normal  Thought process: normal  Thought content:   WNL  Sensory/Perceptual disturbances:   WNL  Orientation: oriented to person, place, time/date, and situation  Attention: Good  Concentration: Good  Memory: WNL  Fund of knowledge:  Good  Insight:   Good  Judgment:  Good  Impulse Control: Good   Risk Assessment: Danger to Self:  No Self-injurious Behavior: No Danger to Others: No Duty to Warn:no Physical Aggression / Violence:No  Access to Firearms a concern: No  Gang Involvement:No   Subjective: Pt present for face-to-face individual therapy via video.  Pt consents to telehealth video session and is aware of limitations and benefits of video sessions.  Location of pt: home Location of therapist: home office.   Pt talked about his family.  Pt's family shows favoritism to other family members.   Pt feels like he has been the least favorite in his family of origin.  Addressed how this impacts pt.   Pt states he recently figured out that his struggles with suicidal thoughts comes from his mother.  When pt was growing up he often heard his mother say that she would rather be dead than put in a nursing home.   Addressed how pt's mother modeled unhealthy coping skills that pt picked up on.    Pt states he has not had suicidal thoughts lately.   Worked on how to manage them when they occur.   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    He enjoys the service activity and the time with Lorelee Roger.   Worked on self care strategies. Provided supportive therapy.  Interventions: Cognitive Behavioral  Therapy and Insight-Oriented  Diagnosis:  F33.1  Plan of Care: Recommend ongoing therapy.   Plan to meet every two weeks.   Pt is progressing toward goals.    Treatment Plan  (Treatment Plan Target Date: 04/08/2024) Client Abilities/Strengths  Pt is bright, engaging, and motivated for therapy.   Client Treatment Preferences  Individual therapy.  Client Statement of Needs  Improve coping skills.  Symptoms  Depressed or irritable mood. Feelings of hopelessness, worthlessness, or inappropriate guilt. Low self-esteem.  Problems Addressed  Unipolar Depression Goals 1. Alleviate depressive symptoms and return to previous level of effective functioning. 2. Appropriately grieve the loss in order to normalize mood and to return to previously adaptive level of functioning. Objective Learn and implement behavioral strategies to overcome depression. Target Date: 2024-04-08 Frequency: Biweekly  Progress: 60 Modality: individual  Related Interventions Engage the client in "behavioral activation," increasing his/her activity level and contact with sources of reward, while identifying processes that inhibit activation.  Use behavioral techniques such as instruction, rehearsal, role-playing, role reversal, as needed, to facilitate activity in the client's daily life; reinforce success. Assist the client in developing skills that increase the likelihood of deriving pleasure from behavioral activation (e.g., assertiveness skills, developing an exercise plan, less internal/more external focus, increased social involvement); reinforce success. Objective Identify important people in life, past and present, and describe the quality, good and poor, of those relationships. Target Date: 2024-04-08 Frequency: Biweekly  Progress: 60 Modality: individual  Related Interventions Conduct Interpersonal Therapy beginning with the assessment of the client's "interpersonal inventory" of important past and present  relationships; develop a case formulation linking depression to grief, interpersonal role disputes, role transitions, and/or interpersonal deficits). Objective Learn and implement problem-solving and decision-making skills. Target Date: 2024-04-08 Frequency: Biweekly  Progress: 60 Modality: individual  Related Interventions Conduct Problem-Solving Therapy using techniques such as psychoeducation, modeling, and role-playing to teach client problem-solving skills (i.e., defining a problem specifically, generating possible solutions, evaluating the pros and cons of each solution, selecting and implementing a plan of action, evaluating the efficacy of the plan, accepting or revising the plan); role-play application of the problem-solving skill to a real life issue. Encourage in the client the development of a positive problem orientation in which problems and solving them are viewed as a natural part of life and not something to be feared, despaired, or avoided. 3. Develop healthy interpersonal relationships that lead to the alleviation and help prevent the relapse of depression. 4. Develop healthy thinking patterns and beliefs about self, others, and the world that lead to the alleviation and help prevent the relapse of depression. 5. Recognize, accept, and cope with feelings of depression. Diagnosis F33.1  Conditions For Discharge Achievement of treatment goals and objectives   Willey Harrier, LCSW

## 2024-04-15 ENCOUNTER — Ambulatory Visit: Admitting: Psychology

## 2024-04-18 ENCOUNTER — Other Ambulatory Visit: Payer: Self-pay | Admitting: Cardiovascular Disease

## 2024-04-18 ENCOUNTER — Other Ambulatory Visit: Payer: Self-pay | Admitting: Family Medicine

## 2024-04-19 ENCOUNTER — Encounter: Payer: Self-pay | Admitting: Neurology

## 2024-04-19 ENCOUNTER — Telehealth: Payer: Self-pay | Admitting: *Deleted

## 2024-04-19 NOTE — Telephone Encounter (Signed)
 Called [facility] at [phone]. Spoke w/ Mansfield Seip. Pump being refilled on 05/02/24. Dr. Godwin Lat provided VO: Change base rate of Baclofen  to . D/c scheduled boluses and reactivate PTM allowing 30mcg bolusx4 over 5 min. 2hr lock out. They will update this at refill for pt.

## 2024-04-19 NOTE — Telephone Encounter (Signed)
 Order faxed to [phone] confirmation received.

## 2024-04-20 NOTE — Telephone Encounter (Signed)
 Faxed signed order below back to [facility], received fax confirmation.

## 2024-04-20 NOTE — Telephone Encounter (Signed)
 Took call from Jael in phone room and spoke w/ Jessie/[facility]. She will fax order for Dr. Godwin Lat to sign at [phone]

## 2024-04-29 ENCOUNTER — Ambulatory Visit (INDEPENDENT_AMBULATORY_CARE_PROVIDER_SITE_OTHER): Admitting: Psychology

## 2024-04-29 DIAGNOSIS — F331 Major depressive disorder, recurrent, moderate: Secondary | ICD-10-CM

## 2024-04-29 NOTE — Progress Notes (Signed)
 [facility] Counselor Initial Adult Exam  Name: Keith Morrow Date: 04/29/2024 MRN: [id number] DOB: 10-Nov-1971 PCP: Neda Balk, MD  Time spent: 11:00am-11:50am   50 minutes  Guardian/Payee:  Jennifer Moellers requested: No   Reason for Visit /Presenting Problem:  Pt present for face-to-face initial assessment update via video.  Pt consents to telehealth video session and is aware of limitations and benefits of virtual sessions. Location of pt: home Location of therapist: home office.  Pt continues to need support dealing with his MS and improving coping skills.   Pt also has stress at work and at times there are challenging family dynamics at home.   Reviewed pt's treatment plan for annual update.  Updated pt's treatment plan and IA.   Pt participated in setting treatment goals.   Plan to meet every month.    Mental Status Exam: Appearance:   Casual     Behavior:  Appropriate  Motor:  Normal  Speech/Language:   Normal Rate  Affect:  Appropriate  Mood:  normal  Thought process:  normal  Thought content:    WNL  Sensory/Perceptual disturbances:    WNL  Orientation:  oriented to person, place, time/date, and situation  Attention:  Good  Concentration:  Good  Memory:  WNL  Fund of knowledge:   Good  Insight:    Good  Judgment:   Good  Impulse Control:  Good   Reported Symptoms:  frustration, stress  Risk Assessment: Danger to Self:  No Self-injurious Behavior: No Danger to Others: No Duty to Warn:no Physical Aggression / Violence:No  Access to Firearms a concern: No  Gang Involvement:No  Patient / guardian was educated about steps to take if suicide or homicide risk level increases between visits: n/a While future psychiatric events cannot be accurately predicted, the patient does not currently require acute inpatient psychiatric care and does not currently meet [facility]  involuntary commitment criteria.  Substance Abuse History: Current  substance abuse: No     Past Psychiatric History:   Previous psychological history is significant for depression Outpatient Providers:Pt has been in therapy in the past. History of Psych Hospitalization: No  Psychological Testing: n/a   Abuse History:  Victim of: No., n/a   Report needed: No. Victim of Neglect:No. Perpetrator of n/a  Witness / Exposure to Domestic Violence: No   Protective Services Involvement: No  Witness to [employer] Violence:  No   Family History:  Family History  Problem Relation Age of Onset   Heart attack Mother 26   Thyroid  disease Mother    Heart disease Mother    Thyroid  disease Sister    Thyroid  disease Brother    Heart disease Maternal Grandmother    Hypertension Maternal Grandmother    Hyperlipidemia Maternal Grandmother    Heart disease Maternal Grandfather    Hypertension Maternal Grandfather    Hyperlipidemia Maternal Grandfather    Stroke Paternal Grandfather    Thyroid  disease Sister    Heart disease Father        2 stents   Coronary artery disease Other        questionable in father    Living situation: the patient lives with his family  Pt does not have support from his extended family. Pt did not feel supported in childhood.  Negative messages from parents impact pt's self esteem.   Family history of depression.  Sexual Orientation: Straight  Relationship Status: married  Name of spouse / other:[name] If a parent,  number of children / ages:3 adult children  Support Systems: spouse  Financial Stress:  No   Income/Employment/Disability: Employment Pt is an Art gallery manager.  Military Service: No   Educational History: Education: Risk manager: Protestant  Any cultural differences that may affect / interfere with treatment:  not applicable   Recreation/Hobbies: reading  Stressors: Health problems   Marital or family conflict    Strengths: Supportive Relationships, Journalist, newspaper, and  Able to Communicate Effectively  Barriers:  none   Legal History: Pending legal issue / charges: The patient has no significant history of legal issues. History of legal issue / charges: n/a  Medical History/Surgical History: reviewed Past Medical History:  Diagnosis Date   Acute bronchitis 05/09/2013   Allergy    Bronchitis, acute 12/10/2011   Candidal skin infection 05/05/2017   Chicken pox as a child   Constipation 02/20/2017   Coronary atherosclerosis of native coronary artery    a. NSTEMI 04/20/11: tx with Promus DES to LAD; bifurcation lesion with oDx 90% tx with POBA; b. staged PCI of right post. AV Branch with Promus DES; c. 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   Visual changes 11/13/2016    Past Surgical History:  Procedure Laterality Date   CORONARY ANGIOPLASTY WITH STENT PLACEMENT     2012   PAIN PUMP IMPLANTATION N/A 08/29/2014   Procedure: Baclofen  pump placement;  Surgeon: Manya Sells, MD;  Location: [facility];  Service: Neurosurgery;  Laterality: N/A;  Baclofen  pump placement   PAIN PUMP IMPLANTATION Right 09/20/2020   Procedure: Right Baclofen  pump replacement;  Surgeon: Manya Sells, MD;  Location: [facility] OR;  Service: Neurosurgery;  Laterality: Right;  Right Baclofen  pump replacement   PROGRAMABLE BACLOFEN  PUMP REVISION  08/29/14   right wrist surgery     CTR   VASCULAR SURGERY     vericose vein in  right femoral area   VASECTOMY     WISDOM TOOTH EXTRACTION      Medications: Current Outpatient Medications  Medication Sig Dispense Refill   aspirin  EC 81 MG tablet Take 81 mg by mouth daily.     atorvastatin  (LIPITOR) 40 MG tablet TAKE 1 TABLET DAILY 90 tablet 3   BACLOFEN  IT 1 Dose by Intrathecal route as directed. Lioresal  1866mcg/459.9mcg daily, Clonidine 466mcg/109.65mcg daily/hydromorphone  456mcg/104.4mcg daily/baclofen  459.74mcg/29.9mcg daily     busPIRone  (BUSPAR ) 10 MG tablet TAKE 1 TABLET IN THE MORNING, AT NOON, IN THE EVENING AND AT BEDTIME 360 tablet 3   clonazePAM  (KLONOPIN ) 1 MG tablet Take 1 tablet (1 mg total) by mouth 4 (four) times daily. 360 tablet 1   cyclobenzaprine  (FLEXERIL ) 10 MG tablet TAKE 1 TABLET BY MOUTH THREE TIMES A DAY AS NEEDED FOR MUSCLE SPASMS 90 tablet 11   CYMBALTA  60 MG capsule Take 1 capsule (60 mg total)  by mouth 2 (two) times daily. 180 capsule 3   dalfampridine  10 MG TB12 Take 1 tablet (10 mg total) by mouth every 12 (twelve) hours. 180 tablet 1   famotidine  (PEPCID ) 40 MG tablet TAKE 1 TABLET DAILY AT BEDTIME (Patient not taking: Reported on 03/29/2024) 90 tablet 3   Fe Fum-FA-B Cmp-C-Zn-Mg-Mn-Cu (HEMOCYTE-PLUS) 106-1 MG TABS Take time daily. (Patient not taking: Reported on 03/29/2024) 30 tablet 3   furosemide  (LASIX ) 20 MG tablet TAKE 1 TABLET (20 MG TOTAL) BY MOUTH DAILY X 5 DAYS, THEN AS NEEDED FOR FOR SHORTNESS OF BREATH/WEIGHT GAIN>3#, PEDAL EDEMA AND CAN TAKE A SECOND TAB DAILY AS NEEDED 180 tablet 0   gabapentin  (NEURONTIN ) 600 MG tablet Take 1 tablet (600 mg total) by mouth 4 (four) times daily. 360 tablet 3   ketoconazole  (NIZORAL ) 2 % shampoo APPLY TOPICALLY 2 TIMES A WEEK 120 mL 5   levothyroxine  (SYNTHROID ) 137 MCG tablet TAKE 1 TABLET DAILY BEFORE BREAKFAST 90 tablet 3   methylphenidate  (RITALIN ) 10 MG tablet Take 1 tablet (10 mg total) by mouth 4 (four) times daily. 120 tablet 0   natalizumab (TYSABRI) 300 MG/15ML injection Inject 300 mg  into the vein every 28 (twenty-eight) days.      nitroGLYCERIN  (NITROSTAT ) 0.4 MG SL tablet PLACE 1 TABLET UNDER THE TOUNGE EVERY 5 MINUTES AS NEEDED FOR CHEST PAIN (X 3 DOSES) 25 tablet 6   nystatin  cream (MYCOSTATIN ) Apply 1 application  topically as directed. 30 g 2   olmesartan -hydrochlorothiazide (BENICAR  HCT) 20-12.5 MG tablet TAKE 1 TABLET DAILY 90 tablet 3   pantoprazole  (PROTONIX ) 40 MG tablet TAKE 1 TABLET BY MOUTH TWICE A DAY 180 tablet 3   potassium chloride  SA (KLOR-CON  M20) 20 MEQ tablet TAKE 1 TABLET BY MOUTH EVERY DAY AS NEEDED WHEN TAKING A SECOND LASIX  TAB ANY GIVEN DAY 90 tablet 3   Semaglutide -Weight Management (WEGOVY ) 0.25 MG/0.5ML SOAJ Inject 0.25 mg into the skin once a week. (Patient not taking: Reported on 03/29/2024) 2 mL 1   sucralfate  (CARAFATE ) 1 g tablet TAKE 1 TABLET FOUR TIMES A DAY 360 tablet 3   sucralfate  (CARAFATE ) 1 GM/10ML suspension Take 10 mLs (1 g total) by mouth 4 (four) times daily -  with meals and at bedtime. Take liquid or tablets but not both 420 mL 3   tamsulosin  (FLOMAX ) 0.4 MG CAPS capsule TAKE 1 CAPSULE DAILY 90 capsule 3   No current facility-administered medications for this visit.    No Known Allergies  Diagnoses:  F33.1  Plan of Care: Recommend ongoing therapy.  Pt participated in setting treatment goals.  Plan to meet monthly.   Pt agrees with treatment plan.    Treatment Plan  (Treatment Plan Target Date: 04/29/2025) Client Abilities/Strengths  Pt is bright, engaging, and motivated for therapy.   Client Treatment Preferences  Individual therapy.  Client Statement of Needs  Improve coping skills.  Symptoms  Depressed or irritable mood. Feelings of hopelessness, worthlessness, or inappropriate guilt. Low self-esteem.  Problems Addressed  Unipolar Depression Goals 1. Alleviate depressive symptoms and return to previous level of effective functioning. 2. Appropriately grieve the loss in order to normalize mood and to return to  previously adaptive level of functioning. Objective Learn and implement behavioral strategies to overcome depression. Target Date: 2025-04-29 Frequency: Monthly  Progress: 38 Modality: individual  Related Interventions Engage the client in "behavioral activation," increasing his/her activity level and contact with sources of reward, while identifying processes that inhibit activation.  Use behavioral techniques  such as instruction, rehearsal, role-playing, role reversal, as needed, to facilitate activity in the client's daily life; reinforce success. Assist the client in developing skills that increase the likelihood of deriving pleasure from behavioral activation (e.g., assertiveness skills, developing an exercise plan, less internal/more external focus, increased social involvement); reinforce success. Objective Identify important people in life, past and present, and describe the quality, good and poor, of those relationships. Target Date: 2025-04-29 Frequency: Monthly  Progress: 65 Modality: individual  Related Interventions Conduct Interpersonal Therapy beginning with the assessment of the client's "interpersonal inventory" of important past and present relationships; develop a case formulation linking depression to grief, interpersonal role disputes, role transitions, and/or interpersonal deficits). Objective Learn and implement problem-solving and decision-making skills. Target Date: 2025-04-29 Frequency: Monthly  Progress: 65 Modality: individual  Related Interventions Conduct Problem-Solving Therapy using techniques such as psychoeducation, modeling, and role-playing to teach client problem-solving skills (i.e., defining a problem specifically, generating possible solutions, evaluating the pros and cons of each solution, selecting and implementing a plan of action, evaluating the efficacy of the plan, accepting or revising the plan); role-play application of the problem-solving skill to a  real life issue. Encourage in the client the development of a positive problem orientation in which problems and solving them are viewed as a natural part of life and not something to be feared, despaired, or avoided. 3. Develop healthy interpersonal relationships that lead to the alleviation and help prevent the relapse of depression. 4. Develop healthy thinking patterns and beliefs about self, others, and the world that lead to the alleviation and help prevent the relapse of depression. 5. Recognize, accept, and cope with feelings of depression. Diagnosis F33.1  Conditions For Discharge Achievement of treatment goals and objectives    Willey Harrier, LCSW

## 2024-05-26 ENCOUNTER — Encounter: Payer: Self-pay | Admitting: *Deleted

## 2024-05-27 ENCOUNTER — Ambulatory Visit: Admitting: Psychology

## 2024-05-27 DIAGNOSIS — F331 Major depressive disorder, recurrent, moderate: Secondary | ICD-10-CM

## 2024-05-27 NOTE — Progress Notes (Signed)
 [facility] Counselor/Therapist Progress Note  Patient ID: WILLYS SALVINO, MRN: [id number],    Date: 05/27/2024  Time Spent: 2:00pm-2:50pm  50 minutes   Treatment Type: Individual Therapy  Reported Symptoms: stress  Mental Status Exam: Appearance:  Casual     Behavior: Appropriate  Motor: Normal  Speech/Language:  Normal Rate  Affect: Appropriate  Mood: normal  Thought process: normal  Thought content:   WNL  Sensory/Perceptual disturbances:   WNL  Orientation: oriented to person, place, time/date, and situation  Attention: Good  Concentration: Good  Memory: WNL  Fund of knowledge:  Good  Insight:   Good  Judgment:  Good  Impulse Control: Good   Risk Assessment: Danger to Self:  No Self-injurious Behavior: No Danger to Others: No Duty to Warn:no Physical Aggression / Violence:No  Access to Firearms a concern: No  Gang Involvement:No   Subjective: Pt present for face-to-face individual therapy via video.  Pt consents to telehealth video session and is aware of limitations and benefits of virtual sessions.  Location of pt: home Location of therapist: home office.   Pt talked about work.  He is having to work long hours bc of a project.   He also found out he has to go back to the office 5 days a week.   Pt is not happy with this bc it was much better for him to be able to work from home.   Pt is feeling more tired and stressed with having to go into the office every day.   Addressed how he can take care of himself . Pt talked about his mother visiting on father's day.   He states the visit went well.  Pt has had trouble bc Lorelee Roger and Sammie Crigler have been bickering in a fun way.   Pt states he can't do that.  He ends up making the family mad.  Addressed the family dynamics and how they impact pt.   Sammie Crigler graduates this fall.  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   Provided supportive therapy.    Interventions: Cognitive  Behavioral Therapy and Insight-Oriented  Diagnosis:  F33.1  Plan of Care: Recommend ongoing therapy.  Pt participated in setting treatment goals.  Plan to meet monthly.   Pt agrees with treatment plan.    Treatment Plan  (Treatment Plan Target Date: 04/29/2025) Client Abilities/Strengths  Pt is bright, engaging, and motivated for therapy.   Client Treatment Preferences  Individual therapy.  Client Statement of Needs  Improve coping skills.  Symptoms  Depressed or irritable mood. Feelings of hopelessness, worthlessness, or inappropriate guilt. Low self-esteem.  Problems Addressed  Unipolar Depression Goals 1. Alleviate depressive symptoms and return to previous level of effective functioning. 2. Appropriately grieve the loss in order to normalize mood and to return to previously adaptive level of functioning. Objective Learn and implement behavioral strategies to overcome depression. Target Date: 2025-04-29 Frequency: Monthly  Progress: 46 Modality: individual  Related Interventions Engage the client in behavioral activation, increasing his/her activity level and contact with sources of reward, while identifying processes that inhibit activation.  Use behavioral techniques such as instruction, rehearsal, role-playing, role reversal, as needed, to facilitate activity in the client's daily life; reinforce success. Assist the client in developing skills that increase the likelihood of deriving pleasure from behavioral activation (e.g., assertiveness skills, developing an exercise plan, less internal/more external focus, increased social involvement); reinforce success. Objective Identify important people in life, past and present,  and describe the quality, good and poor, of those relationships. Target Date: 2025-04-29 Frequency: Monthly  Progress: 65 Modality: individual  Related Interventions Conduct Interpersonal Therapy beginning with the assessment of the client's interpersonal  inventory of important past and present relationships; develop a case formulation linking depression to grief, interpersonal role disputes, role transitions, and/or interpersonal deficits). Objective Learn and implement problem-solving and decision-making skills. Target Date: 2025-04-29 Frequency: Monthly  Progress: 65 Modality: individual  Related Interventions Conduct Problem-Solving Therapy using techniques such as psychoeducation, modeling, and role-playing to teach client problem-solving skills (i.e., defining a problem specifically, generating possible solutions, evaluating the pros and cons of each solution, selecting and implementing a plan of action, evaluating the efficacy of the plan, accepting or revising the plan); role-play application of the problem-solving skill to a real life issue. Encourage in the client the development of a positive problem orientation in which problems and solving them are viewed as a natural part of life and not something to be feared, despaired, or avoided. 3. Develop healthy interpersonal relationships that lead to the alleviation and help prevent the relapse of depression. 4. Develop healthy thinking patterns and beliefs about self, others, and the world that lead to the alleviation and help prevent the relapse of depression. 5. Recognize, accept, and cope with feelings of depression. Diagnosis F33.1  Conditions For Discharge Achievement of treatment goals and objectives   Willey Harrier, LCSW

## 2024-06-05 NOTE — Assessment & Plan Note (Signed)
 Well controlled, no changes to meds. Encouraged heart healthy diet such as the DASH diet and exercise as tolerated.

## 2024-06-05 NOTE — Assessment & Plan Note (Signed)
 hgba1c acceptable, minimize simple carbs. Increase exercise as tolerated.

## 2024-06-05 NOTE — Assessment & Plan Note (Signed)
 Encourage heart healthy diet such as MIND or DASH diet, increase exercise, avoid trans fats, simple carbohydrates and processed foods, consider a krill or fish or flaxseed oil cap daily. Tolerating Atorvastatin

## 2024-06-05 NOTE — Assessment & Plan Note (Signed)
 On Levothyroxine, continue to monitor

## 2024-06-05 NOTE — Assessment & Plan Note (Addendum)
 Encouraged DASH or MIND diet, decrease po intake and increase exercise as tolerated. Needs 7-8 hours of sleep nightly. Avoid trans fats, eat small, frequent meals every 4-5 hours with lean proteins, complex carbs and healthy fats. Minimize simple carbs, high fat foods and processed foods

## 2024-06-05 NOTE — Assessment & Plan Note (Signed)
 Continues to follow with behavioral health. No change in meds

## 2024-06-06 ENCOUNTER — Ambulatory Visit (INDEPENDENT_AMBULATORY_CARE_PROVIDER_SITE_OTHER): Payer: Managed Care, Other (non HMO) | Admitting: Family Medicine

## 2024-06-06 VITALS — BP 118/72 | HR 65 | Resp 16 | Ht 75.0 in | Wt 328.6 lb

## 2024-06-06 DIAGNOSIS — E782 Mixed hyperlipidemia: Secondary | ICD-10-CM | POA: Diagnosis not present

## 2024-06-06 DIAGNOSIS — I10 Essential (primary) hypertension: Secondary | ICD-10-CM

## 2024-06-06 DIAGNOSIS — F418 Other specified anxiety disorders: Secondary | ICD-10-CM

## 2024-06-06 DIAGNOSIS — R739 Hyperglycemia, unspecified: Secondary | ICD-10-CM | POA: Diagnosis not present

## 2024-06-06 DIAGNOSIS — E038 Other specified hypothyroidism: Secondary | ICD-10-CM

## 2024-06-06 DIAGNOSIS — E6609 Other obesity due to excess calories: Secondary | ICD-10-CM

## 2024-06-06 DIAGNOSIS — E611 Iron deficiency: Secondary | ICD-10-CM

## 2024-06-06 NOTE — Progress Notes (Signed)
 Subjective:    Patient ID: Keith Morrow, male    DOB: 06/24/1971, 53 y.o.   MRN: [id number]  Chief Complaint  Patient presents with   Medical Management of Chronic Issues    Patient presents today for a 4 month follow-up.   Quality Metric Gaps    Hep B, colonoscopy    HPI Discussed the use of AI scribe software for clinical note transcription with the patient, who gave verbal consent to proceed.  History of Present Illness Keith Morrow is a 53 year old male who presents for evaluation of a mole and follow-up on sleep apnea management.  He has a mole that has been present for two to three years, described as a 'scratch off mole'. He can pull pieces of it off during showers, noting them as 'waxy'.  He experiences difficulty with breathing, particularly when sitting for extended periods, such as during a recent 30-hour work shift. He occasionally needs to take deep breaths to feel comfortable and is concerned about the impact of his posture and weight on his diaphragm and breathing. No major recurrent respiratory issues are reported, but there is occasional difficulty with breathing deeply, especially after prolonged sitting.  He has a history of sleep apnea and reports that his CPAP machine is indicating 70 to 80 events per night. He experiences discomfort from the pressure on his nose, which requires him to reset the machine during the night.  He has not been able to proceed with a colonoscopy due to difficulties with the preparation process. The last communication with the GI office was unresolved, and he has not been contacted for further instructions.  He is not currently on semaglutide  due to insurance coverage issues, despite its potential benefits for his heart condition. He has gained approximately 10 pounds since his last visit, although he expected a higher weight gain. He attributes some of the weight perception to changes in clothing size and fit.  He has reduced his  counseling sessions to once a month and feels that his mood has been stable without significant changes. He has not experienced any major emotional outbursts recently.    Past Medical History:  Diagnosis Date   Acute bronchitis 05/09/2013   Allergy    Bronchitis, acute 12/10/2011   Candidal skin infection 05/05/2017   Chicken pox as a child   Constipation 02/20/2017   Coronary atherosclerosis of native coronary artery    a. NSTEMI 04/20/11: tx with Promus DES to LAD; bifurcation lesion with oDx 90% tx with POBA; b. staged PCI of right post. AV Branch with Promus DES; c. 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   Visual changes 11/13/2016    Past Surgical History:  Procedure Laterality Date   CORONARY ANGIOPLASTY WITH STENT PLACEMENT     2012  PAIN PUMP IMPLANTATION N/A 08/29/2014   Procedure: Baclofen  pump placement;  Surgeon: Fairy Levels, MD;  Location: [facility];  Service: Neurosurgery;  Laterality: N/A;  Baclofen  pump placement   PAIN PUMP IMPLANTATION Right 09/20/2020   Procedure: Right Baclofen  pump replacement;  Surgeon: Levels Fairy, MD;  Location: [facility];  Service: Neurosurgery;  Laterality: Right;  Right Baclofen  pump replacement   PROGRAMABLE BACLOFEN  PUMP REVISION  08/29/14   right wrist surgery     CTR   VASCULAR SURGERY     vericose vein in right femoral area   VASECTOMY     WISDOM TOOTH EXTRACTION       Family History  Problem Relation Age of Onset   Heart attack Mother 67   Thyroid  disease Mother    Heart disease Mother    Thyroid  disease Sister    Thyroid  disease Brother    Heart disease Maternal Grandmother    Hypertension Maternal Grandmother    Hyperlipidemia Maternal Grandmother    Heart disease Maternal Grandfather    Hypertension Maternal Grandfather    Hyperlipidemia Maternal Grandfather    Stroke Paternal Grandfather    Thyroid  disease Sister    Heart disease Father        2 stents   Coronary artery disease Other        questionable in father    Social History   Socioeconomic History   Marital status: Married    Spouse name: Not on file   Number of children: 3   Years of education: Not on file   Highest education level: Bachelor's degree (e.g., BA, AB, BS)  Occupational History   Not on file  Tobacco Use   Smoking status: Never   Smokeless tobacco: Never  Vaping Use   Vaping status: Never Used  Substance and Sexual Activity   Alcohol use: Yes    Comment: socially   Drug use: No   Sexual activity: Yes    Partners: Female    Comment: 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, works Engineer, maintenance, no dietary restrictions  Other Topics Concern   Not on file  Social History Narrative   Not on file   Social Drivers of Health   Financial Resource Strain: Low Risk  (06/06/2024)   Overall Financial Resource Strain (CARDIA)    Difficulty of Paying Living Expenses: Not hard at all  Food Insecurity: No Food Insecurity (06/06/2024)   Hunger Vital Sign    Worried About Running Out of Food in the Last Year: Never true    Ran Out of Food in the Last Year: Never true  Transportation Needs: No Transportation Needs (06/06/2024)   PRAPARE - Administrator, Civil Service (Medical): No    Lack of Transportation (Non-Medical): No  Physical Activity: Inactive (06/06/2024)   Exercise Vital Sign    Days of Exercise per Week: 0 days    Minutes of Exercise per  Session: Not on file  Stress: Stress Concern Present (06/06/2024)   [employer] of Occupational Health - Occupational Stress Questionnaire    Feeling of Stress: Rather much  Social Connections: Unknown (06/06/2024)   Social Connection and Isolation Panel    Frequency of Communication with Friends and Family: Patient declined    Frequency of Social Gatherings with Friends and Family: Patient declined    Attends Religious Services: Patient declined    Database administrator or Organizations: No    Attends Banker Meetings: Not on file    Marital  Status: Married  Catering manager Violence: Not on file    Outpatient Medications Prior to Visit  Medication Sig Dispense Refill   aspirin  EC 81 MG tablet Take 81 mg by mouth daily.     atorvastatin  (LIPITOR) 40 MG tablet TAKE 1 TABLET DAILY 90 tablet 3   BACLOFEN  IT 1 Dose by Intrathecal route as directed. Lioresal  1869mcg/459.9mcg daily, Clonidine 49mcg/109.65mcg daily/hydromorphone  490mcg/104.4mcg daily/baclofen  459.37mcg/29.9mcg daily     busPIRone  (BUSPAR ) 10 MG tablet TAKE 1 TABLET IN THE MORNING, AT NOON, IN THE EVENING AND AT BEDTIME 360 tablet 3   clonazePAM  (KLONOPIN ) 1 MG tablet Take 1 tablet (1 mg total) by mouth 4 (four) times daily. 360 tablet 1   cyclobenzaprine  (FLEXERIL ) 10 MG tablet TAKE 1 TABLET BY MOUTH THREE TIMES A DAY AS NEEDED FOR MUSCLE SPASMS 90 tablet 11   CYMBALTA  60 MG capsule Take 1 capsule (60 mg total) by mouth 2 (two) times daily. 180 capsule 3   dalfampridine  10 MG TB12 Take 1 tablet (10 mg total) by mouth every 12 (twelve) hours. 180 tablet 1   Fe Fum-FA-B Cmp-C-Zn-Mg-Mn-Cu (HEMOCYTE-PLUS) 106-1 MG TABS Take time daily. 30 tablet 3   furosemide  (LASIX ) 20 MG tablet TAKE 1 TABLET (20 MG TOTAL) BY MOUTH DAILY X 5 DAYS, THEN AS NEEDED FOR FOR SHORTNESS OF BREATH/WEIGHT GAIN>3#, PEDAL EDEMA AND CAN TAKE A SECOND TAB DAILY AS NEEDED 180 tablet 0   gabapentin  (NEURONTIN ) 600 MG tablet Take 1 tablet (600  mg total) by mouth 4 (four) times daily. 360 tablet 3   ketoconazole  (NIZORAL ) 2 % shampoo APPLY TOPICALLY 2 TIMES A WEEK 120 mL 5   levothyroxine  (SYNTHROID ) 137 MCG tablet TAKE 1 TABLET DAILY BEFORE BREAKFAST 90 tablet 3   methylphenidate  (RITALIN ) 10 MG tablet Take 1 tablet (10 mg total) by mouth 4 (four) times daily. 120 tablet 0   natalizumab (TYSABRI) 300 MG/15ML injection Inject 300 mg into the vein every 28 (twenty-eight) days.      nitroGLYCERIN  (NITROSTAT ) 0.4 MG SL tablet PLACE 1 TABLET UNDER THE TOUNGE EVERY 5 MINUTES AS NEEDED FOR CHEST PAIN (X 3 DOSES) 25 tablet 6   nystatin  cream (MYCOSTATIN ) Apply 1 application  topically as directed. 30 g 2   olmesartan -hydrochlorothiazide (BENICAR  HCT) 20-12.5 MG tablet TAKE 1 TABLET DAILY 90 tablet 3   pantoprazole  (PROTONIX ) 40 MG tablet TAKE 1 TABLET BY MOUTH TWICE A DAY 180 tablet 3   potassium chloride  SA (KLOR-CON  M20) 20 MEQ tablet TAKE 1 TABLET BY MOUTH EVERY DAY AS NEEDED WHEN TAKING A SECOND LASIX  TAB ANY GIVEN DAY 90 tablet 3   sucralfate  (CARAFATE ) 1 g tablet TAKE 1 TABLET FOUR TIMES A DAY 360 tablet 3   sucralfate  (CARAFATE ) 1 GM/10ML suspension Take 10 mLs (1 g total) by mouth 4 (four) times daily -  with meals and at bedtime. Take liquid or tablets but not both 420 mL 3   tamsulosin  (FLOMAX ) 0.4 MG CAPS capsule TAKE 1 CAPSULE DAILY 90 capsule 3   famotidine  (PEPCID ) 40 MG tablet TAKE 1 TABLET DAILY AT BEDTIME (Patient not taking: Reported on 06/06/2024) 90 tablet 3   Semaglutide -Weight Management (WEGOVY ) 0.25 MG/0.5ML SOAJ Inject 0.25 mg into the skin once a week. (Patient not taking: Reported on 06/06/2024) 2 mL 1   No facility-administered medications prior to visit.    No Known Allergies  Review of Systems  Constitutional:  Negative for fever and malaise/fatigue.  HENT:  Negative for congestion.   Eyes:  Negative  for blurred vision.  Respiratory:  Negative for shortness of breath.   Cardiovascular:  Negative for chest  pain, palpitations and leg swelling.  Gastrointestinal:  Negative for abdominal pain, blood in stool and nausea.  Genitourinary:  Negative for dysuria and frequency.  Musculoskeletal:  Negative for falls.  Skin:  Negative for rash.  Neurological:  Negative for dizziness, loss of consciousness and headaches.  Endo/Heme/Allergies:  Negative for environmental allergies.  Psychiatric/Behavioral:  Negative for depression. The patient is not nervous/anxious.        Objective:    Physical Exam Vitals reviewed.  Constitutional:      Appearance: Normal appearance. He is obese. He is not ill-appearing.  HENT:     Head: Normocephalic and atraumatic.     Nose: Nose normal.   Eyes:     Conjunctiva/sclera: Conjunctivae normal.    Cardiovascular:     Rate and Rhythm: Normal rate.     Pulses: Normal pulses.     Heart sounds: Normal heart sounds. No murmur heard. Pulmonary:     Effort: Pulmonary effort is normal.     Breath sounds: Normal breath sounds. No wheezing.  Abdominal:     Palpations: Abdomen is soft. There is no mass.     Tenderness: There is no abdominal tenderness.   Musculoskeletal:     Cervical back: Normal range of motion.     Right lower leg: No edema.     Left lower leg: No edema.   Skin:    General: Skin is warm and dry.     Findings: Lesion present.     Comments: 2 mm raised, gray, scaly lesion, circular in left axillae. 1 mm raised, scaly, waxy, circular lesion on left tibial plateau. Some gray specks in middle otherwise flesh colored.    Neurological:     General: No focal deficit present.     Mental Status: He is alert and oriented to person, place, and time.   Psychiatric:        Mood and Affect: Mood normal.     BP 118/72   Pulse 65   Resp 16   Ht 6' 3 (1.905 m)   Wt (!) 328 lb 9.6 oz (149.1 kg)   SpO2 94%   BMI 41.07 kg/m  Wt Readings from Last 3 Encounters:  06/06/24 (!) 328 lb 9.6 oz (149.1 kg)  03/29/24 (!) 350 lb (158.8 kg)  01/11/24 (!)  320 lb (145.2 kg)    Diabetic Foot Exam - Simple   No data filed    Lab Results  Component Value Date   WBC 7.9 03/29/2024   HGB 12.8 (L) 03/29/2024   HCT 38.3 03/29/2024   PLT 200 03/29/2024   GLUCOSE 96 01/11/2024   CHOL 134 01/11/2024   TRIG 61.0 01/11/2024   HDL 53.50 01/11/2024   LDLCALC 68 01/11/2024   ALT 23 01/11/2024   AST 19 01/11/2024   NA 138 01/11/2024   K 3.8 01/11/2024   CL 98 01/11/2024   CREATININE 1.01 01/11/2024   BUN 15 01/11/2024   CO2 31 01/11/2024   TSH 0.89 01/11/2024   PSA 0.52 02/11/2016   INR 1.00 04/21/2011   HGBA1C 5.7 01/11/2024    Lab Results  Component Value Date   TSH 0.89 01/11/2024   Lab Results  Component Value Date   WBC 7.9 03/29/2024   HGB 12.8 (L) 03/29/2024   HCT 38.3 03/29/2024   MCV 89 03/29/2024   PLT 200 03/29/2024   Lab Results  Component Value Date   NA 138 01/11/2024   K 3.8 01/11/2024   CO2 31 01/11/2024   GLUCOSE 96 01/11/2024   BUN 15 01/11/2024   CREATININE 1.01 01/11/2024   BILITOT 0.7 01/11/2024   ALKPHOS 96 01/11/2024   AST 19 01/11/2024   ALT 23 01/11/2024   PROT 6.8 01/11/2024   ALBUMIN 4.3 01/11/2024   CALCIUM  9.0 01/11/2024   ANIONGAP 10 04/21/2023   EGFR 105 04/02/2021   GFR 86.49 01/11/2024   Lab Results  Component Value Date   CHOL 134 01/11/2024   Lab Results  Component Value Date   HDL 53.50 01/11/2024   Lab Results  Component Value Date   LDLCALC 68 01/11/2024   Lab Results  Component Value Date   TRIG 61.0 01/11/2024   Lab Results  Component Value Date   CHOLHDL 3 01/11/2024   Lab Results  Component Value Date   HGBA1C 5.7 01/11/2024       Assessment & Plan:  Depression with anxiety Assessment & Plan: Continues to follow with behavioral health. No change in meds   Primary hypertension Assessment & Plan: Well controlled, no changes to meds. Encouraged heart healthy diet such as the DASH diet and exercise as tolerated.    Hyperglycemia Assessment &  Plan: hgba1c acceptable, minimize simple carbs. Increase exercise as tolerated.    Mixed hyperlipidemia Assessment & Plan: Encourage heart healthy diet such as MIND or DASH diet, increase exercise, avoid trans fats, simple carbohydrates and processed foods, consider a krill or fish or flaxseed oil cap daily. Tolerating Atorvastatin    Other specified hypothyroidism Assessment & Plan: On Levothyroxine , continue to monitor   Obesity due to excess calories with serious comorbidity, unspecified class Assessment & Plan: Encouraged DASH or MIND diet, decrease po intake and increase exercise as tolerated. Needs 7-8 hours of sleep nightly. Avoid trans fats, eat small, frequent meals every 4-5 hours with lean proteins, complex carbs and healthy fats. Minimize simple carbs, high fat foods and processed foods      Assessment and Plan Assessment & Plan Sleep Apnea Experiencing 70-80 events per night and excessive pressure on the nose with CPAP machine. - Reschedule appointment with sleep apnea specialist to address CPAP machine issues.  Obesity Weight is 328 pounds, 10 pounds higher than previous. Difficulty obtaining semaglutide  due to insurance coverage issues. Discussed weight management challenges and the importance of diet and exercise. Insurance covers semaglutide  only for diabetes, not for cardiac patients, despite benefits. Potential pharmacist assistance if blood work indicates coverage. - Order blood work including hemoglobin A1c to assess for diabetes. - Discuss semaglutide  coverage with employer. - Consider referral to pharmacist for insurance coverage assistance if blood work indicates potential coverage.  Multiple Sclerosis Difficulty with mobility, particularly in transferring from the toilet to the chair. Considering medication adjustment to increase spasticity for better resistance, but will delay until after trip to [place], [place] . Importance of balancing medication  effects with mobility needs discussed. - Consider adjusting medication to increase spasticity after the trip. - Continue physical therapy discussions regarding mobility.  Seborrheic Keratosis Located in an irritated spot. Benign but annoying. He can pull pieces off in the shower, indicating waxy nature. If lesion becomes more irritated, itchy, or painful, insurance might cover removal. Removal might not be permanent as lesions can recur. Dermatology consultation recommended if removal is desired. - Consider removal if lesion becomes more irritated, itchy, or painful. - Consult dermatology if removal is desired.  Colon Cancer Screening Has not completed  colonoscopy due to prep process issues. GI office has not followed up after he explained difficulty with prep. Discussed alternative prep methods such as enemas and dietary adjustments. - Send note to GI to follow up on colonoscopy prep issues.  Depression with Anxiety Reduced counseling sessions to once a month with no significant mood changes or major events. Managing well with current frequency. Discussed option of virtual counseling if needed. - Continue monthly counseling sessions. - Consider virtual counseling options if needed.  General Health Maintenance Due for routine blood work to monitor kidney function, cholesterol, anemia, iron studies, and hemoglobin A1c for diabetes assessment. - Order routine blood work including kidney function, cholesterol, anemia, iron studies, and hemoglobin A1c.     Harlene Horton, MD

## 2024-06-07 ENCOUNTER — Ambulatory Visit: Payer: Self-pay | Admitting: Family Medicine

## 2024-06-07 LAB — COMPREHENSIVE METABOLIC PANEL WITH GFR
ALT: 18 U/L (ref 0–53)
AST: 14 U/L (ref 0–37)
Albumin: 4.3 g/dL (ref 3.5–5.2)
Alkaline Phosphatase: 87 U/L (ref 39–117)
BUN: 16 mg/dL (ref 6–23)
CO2: 31 meq/L (ref 19–32)
Calcium: 9.2 mg/dL (ref 8.4–10.5)
Chloride: 102 meq/L (ref 96–112)
Creatinine, Ser: 0.95 mg/dL (ref 0.40–1.50)
GFR: 92.82 mL/min (ref >60.00)
Glucose, Bld: 84 mg/dL (ref 70–99)
Potassium: 4 meq/L (ref 3.5–5.1)
Sodium: 141 meq/L (ref 135–145)
Total Bilirubin: 0.6 mg/dL (ref 0.2–1.2)
Total Protein: 6.7 g/dL (ref 6.0–8.3)

## 2024-06-07 LAB — LIPID PANEL
Cholesterol: 136 mg/dL (ref 0–200)
HDL: 44.6 mg/dL (ref >39.00)
LDL Cholesterol: 79 mg/dL (ref 0–99)
NonHDL: 91.69
Total CHOL/HDL Ratio: 3
Triglycerides: 63 mg/dL (ref 0.0–149.0)
VLDL: 12.6 mg/dL (ref 0.0–40.0)

## 2024-06-07 LAB — CBC WITH DIFFERENTIAL/PLATELET
Basophils Absolute: 0.1 10*3/uL (ref 0.0–0.1)
Basophils Relative: 1 % (ref 0.0–3.0)
Eosinophils Absolute: 0.6 10*3/uL (ref 0.0–0.7)
Eosinophils Relative: 8.1 % — ABNORMAL HIGH (ref 0.0–5.0)
HCT: 37 % — ABNORMAL LOW (ref 39.0–52.0)
Hemoglobin: 12.4 g/dL — ABNORMAL LOW (ref 13.0–17.0)
Lymphocytes Relative: 29.5 % (ref 12.0–46.0)
Lymphs Abs: 2.3 10*3/uL (ref 0.7–4.0)
MCHC: 33.4 g/dL (ref 30.0–36.0)
MCV: 88.6 fl (ref 78.0–100.0)
Monocytes Absolute: 0.6 10*3/uL (ref 0.1–1.0)
Monocytes Relative: 7.2 % (ref 3.0–12.0)
Neutro Abs: 4.3 10*3/uL (ref 1.4–7.7)
Neutrophils Relative %: 54.2 % (ref 43.0–77.0)
Platelets: 210 10*3/uL (ref 150.0–400.0)
RBC: 4.18 Mil/uL — ABNORMAL LOW (ref 4.22–5.81)
RDW: 16.1 % — ABNORMAL HIGH (ref 11.5–15.5)
WBC: 7.8 10*3/uL (ref 4.0–10.5)

## 2024-06-07 LAB — HEMOGLOBIN A1C: Hgb A1c MFr Bld: 6.1 % (ref 4.6–6.5)

## 2024-06-07 LAB — TSH: TSH: 1.72 u[IU]/mL (ref 0.35–5.50)

## 2024-06-08 LAB — IRON,TIBC AND FERRITIN PANEL
%SAT: 19 % — ABNORMAL LOW (ref 20–48)
Ferritin: 15 ng/mL — ABNORMAL LOW (ref 38–380)
Iron: 65 ug/dL (ref 50–180)
TIBC: 348 ug/dL (ref 250–425)

## 2024-06-14 ENCOUNTER — Telehealth: Payer: Self-pay

## 2024-06-14 NOTE — Telephone Encounter (Signed)
 Time slot held for August 28th schedule with Dr. Legrand. Case ID [id number]. Attempted to reach patient to discuss. No answer, lvm for patient to return call.

## 2024-06-14 NOTE — Telephone Encounter (Signed)
-----   Message from Nurse Tylene PARAS sent at 06/14/2024  9:30 AM EDT ----- Regarding: RE: Scheduling a hosp case with admission Pod C nurses,   Please see below.  Please hold a spot on Dr. Clayburn August 28th spot for this patient and contact the patient to check his availability.   I am waiting to hear back from the hospitallist service for the next steps. There may be a hole on his next July week as well that could potentially offer the patient.   Sheri ----- Message ----- From: Legrand Victory LITTIE DOUGLAS, MD Sent: 06/13/2024   6:02 PM EDT To: Tylene LITTIE Molt, RN; Harlene DELENA Horton, MD Subject: RE: Scheduling a hosp case with admission      Sheri,  Thank you for the update and the question, and I did not know this patient had a positive Cologuard when the request was made earlier this year.  At that time, and the last I knew from a discussion we had at a division meeting over a year ago, [facility] was no longer able to accommodate elective admissions for bowel preparation, nor did our practice's staffing allow for that.  And I do not recall hearing anything different since then.  But if that has changed, then I am very glad to hear it, and arrangements certainly should be made for Mr. Greenawalt to have a colonoscopy on my next available outpatient hospital-based procedure block on Thursday, August 28.  That would be ideal since other blocks that are on Mondays would not allow for direct admission on a Sunday.  Please hold an Aug 28th slot for him, let the patient know we have this in the works, and then let me know what we need to do to work with the [facility] service and make this happen for him.  Rickey ----- Message ----- From: Molt Tylene LITTIE, RN Sent: 06/09/2024   3:10 PM EDT To: Victory LITTIE Legrand DOUGLAS, MD Subject: Scheduling a hosp case with admission          Dr. Legrand, I received a call from Olam Posey today on behalf of Dr. Horton about this patient. He was referred for a positive Cologuard . These are some notes in  the chart that the patient would require a admission or at least assistance to prep due to his MS. Patient is not able to bear weight and has no movement below the waist according to [place]. Olam and Dr. Horton are asking what the plan is to get the patient screened for his positive Cologuard?  We had developed a plan with the hospitalist team to assist in admissions for this case.  Do we plan do proceed with a colonoscopy.  Happy to discuss when you return.    Sheri

## 2024-06-15 NOTE — Telephone Encounter (Signed)
 Attempted to reach patient. No answer, left VM for patient to return call.

## 2024-06-16 ENCOUNTER — Encounter: Payer: Self-pay | Admitting: Gastroenterology

## 2024-06-16 NOTE — Telephone Encounter (Signed)
 Attempted to call patient at both home and mobile number. No answer. Also attempted to reach spouse. No answer. Left vm for patient to return call.

## 2024-06-17 NOTE — Telephone Encounter (Signed)
 Attempted to reach patient. No answer, left VM for patient to return call.

## 2024-06-20 NOTE — Telephone Encounter (Signed)
 Patient returned call. He states that he can be available for the colonoscopy on August 28th. I advised patient that we are still working with the hospitalist team to confirm that we can get him admitted the day before for his prep, and we will keep him updated on everything. Patient verbalized understanding.

## 2024-06-22 ENCOUNTER — Telehealth: Payer: Self-pay | Admitting: *Deleted

## 2024-06-22 NOTE — Telephone Encounter (Signed)
 Faxed signed order below to [phone]. Received fax confirmation.

## 2024-06-24 NOTE — Telephone Encounter (Signed)
 This patient will be admitted to [facility] on 08/03/2024 for bowel preparation prior to colonoscopy planned the following day.  He will either be admitted to our service or that of the hospitalist depending on what time of day the bed is available, and internal medicine hospitalist will assist with his medical management during what is expected to be just an overnight admission.  He will most likely be discharged later the day of his colonoscopy.  Because of his limited mobility from MS and my concerns that constipation could make a more challenging bowel preparation, my advice that starting on 07/31/2024, he take 1 capful of MiraLAX and a glass of water each morning.  Clear liquids only for intake starting in the morning of 08/03/2024, and he should come to the admitting office at [facility] between 730 and 8 AM, being prepared to wait until a bed is available.  VEAR Brand MD

## 2024-06-27 NOTE — Telephone Encounter (Signed)
 Attempted to reach patient. No answer, left VM for patient to return call.

## 2024-06-28 NOTE — Telephone Encounter (Signed)
 Patient returned call. Discussed arrival time and checking in for procedure. Discussed plan for admitting and performing colon prep. Instructions sent to patient via my chart regarding check in, 5 day diet prep, and pre-medicating with Miralax. Patient verbalized understanding of all instructions.

## 2024-06-30 ENCOUNTER — Ambulatory Visit: Admitting: Psychology

## 2024-06-30 DIAGNOSIS — F331 Major depressive disorder, recurrent, moderate: Secondary | ICD-10-CM | POA: Diagnosis not present

## 2024-06-30 NOTE — Progress Notes (Signed)
 [facility] Counselor/Therapist Progress Note  Patient ID: THIAGO RAGSDALE, MRN: [id number],    Date: 06/30/2024  Time Spent: 4:00pm-4:50pm  50 minutes   Treatment Type: Individual Therapy  Reported Symptoms: stress  Mental Status Exam: Appearance:  Casual     Behavior: Appropriate  Motor: Normal  Speech/Language:  Normal Rate  Affect: Appropriate  Mood: normal  Thought process: normal  Thought content:   WNL  Sensory/Perceptual disturbances:   WNL  Orientation: oriented to person, place, time/date, and situation  Attention: Good  Concentration: Good  Memory: WNL  Fund of knowledge:  Good  Insight:   Good  Judgment:  Good  Impulse Control: Good   Risk Assessment: Danger to Self:  No Self-injurious Behavior: No Danger to Others: No Duty to Warn:no Physical Aggression / Violence:No  Access to Firearms a concern: No  Gang Involvement:No   Subjective: Pt present for face-to-face individual therapy via video.  Pt consents to telehealth video session and is aware of limitations and benefits of virtual sessions.  Location of pt: home Location of therapist: home office.   Pt talked about work.  He continues to have to work long hours bc of big projects they are working on.  He has to go back to the office 5 days a week and that will start next week.    Pt is not happy with this bc it was much better for him to be able to work from home.   Pt is feeling more tired and stressed with having to go into the office.  Pt gets more exhausted and withdrawn.  There is also a man at work who tends to talk down to pt and this frustrates him.  Worked on [employer].  Addressed how he can take care of himself . [place]    Pt talked about construction of a new neighborhood occurring near his house.  This has been upsetting to pt and family bc it decreases their privacy.   Pt and wife celebrated their 30 year wedding anniversary this  week.  They are planning on getting new wedding bands.   Provided supportive therapy.    Interventions: Cognitive Behavioral Therapy and Insight-Oriented  Diagnosis:  F33.1  Plan of Care: Recommend ongoing therapy.  Pt participated in setting treatment goals.  Plan to meet monthly.   Pt agrees with treatment plan.    Treatment Plan  (Treatment Plan Target Date: 04/29/2025) Client Abilities/Strengths  Pt is bright, engaging, and motivated for therapy.   Client Treatment Preferences  Individual therapy.  Client Statement of Needs  Improve coping skills.  Symptoms  Depressed or irritable mood. Feelings of hopelessness, worthlessness, or inappropriate guilt. Low self-esteem.  Problems Addressed  Unipolar Depression Goals 1. Alleviate depressive symptoms and return to previous level of effective functioning. 2. Appropriately grieve the loss in order to normalize mood and to return to previously adaptive level of functioning. Objective Learn and implement behavioral strategies to overcome depression. Target Date: 2025-04-29 Frequency: Monthly  Progress: 77 Modality: individual  Related Interventions Engage the client in behavioral activation, increasing his/her activity level and contact with sources of reward, while identifying processes that inhibit activation.  Use behavioral techniques such as instruction, rehearsal, role-playing, role reversal, as needed, to facilitate activity in the client's daily life; reinforce success. Assist the client in developing skills that increase the likelihood of deriving pleasure from behavioral activation (e.g., assertiveness skills, developing an exercise plan, less internal/more  external focus, increased social involvement); reinforce success. Objective Identify important people in life, past and present, and describe the quality, good and poor, of those relationships. Target Date: 2025-04-29 Frequency: Monthly  Progress: 65 Modality: individual   Related Interventions Conduct Interpersonal Therapy beginning with the assessment of the client's interpersonal inventory of important past and present relationships; develop a case formulation linking depression to grief, interpersonal role disputes, role transitions, and/or interpersonal deficits). Objective Learn and implement problem-solving and decision-making skills. Target Date: 2025-04-29 Frequency: Monthly  Progress: 65 Modality: individual  Related Interventions Conduct Problem-Solving Therapy using techniques such as psychoeducation, modeling, and role-playing to teach client problem-solving skills (i.e., defining a problem specifically, generating possible solutions, evaluating the pros and cons of each solution, selecting and implementing a plan of action, evaluating the efficacy of the plan, accepting or revising the plan); role-play application of the problem-solving skill to a real life issue. Encourage in the client the development of a positive problem orientation in which problems and solving them are viewed as a natural part of life and not something to be feared, despaired, or avoided. 3. Develop healthy interpersonal relationships that lead to the alleviation and help prevent the relapse of depression. 4. Develop healthy thinking patterns and beliefs about self, others, and the world that lead to the alleviation and help prevent the relapse of depression. 5. Recognize, accept, and cope with feelings of depression. Diagnosis F33.1  Conditions For Discharge Achievement of treatment goals and objectives   Veva Alma, LCSW

## 2024-07-06 ENCOUNTER — Encounter (HOSPITAL_COMMUNITY): Payer: Self-pay

## 2024-07-06 NOTE — Telephone Encounter (Signed)
 Called bed control and reserved a bed for patient to be admitted on 08/03/2024. Patient notified to remain at home until he receives a call from bed control to come to [facility]. Patient verbalized understanding.

## 2024-07-20 ENCOUNTER — Other Ambulatory Visit: Payer: Self-pay | Admitting: Family Medicine

## 2024-07-25 ENCOUNTER — Encounter (HOSPITAL_COMMUNITY): Payer: Self-pay | Admitting: Gastroenterology

## 2024-07-25 NOTE — Progress Notes (Signed)
 Attempted to obtain medical history for pre op call via telephone, unable to reach at this time. HIPAA compliant voicemail message left requesting return call to pre surgical testing department.

## 2024-07-26 ENCOUNTER — Telehealth: Payer: Self-pay

## 2024-07-26 ENCOUNTER — Encounter: Payer: Self-pay | Admitting: Family Medicine

## 2024-07-26 NOTE — Telephone Encounter (Signed)
   Name: Keith Morrow  DOB: 1971-11-23  MRN: [id number]  Primary Cardiologist: Ozell Fell, MD  Chart reviewed as part of pre-operative protocol coverage. The patient has an upcoming visit scheduled with Dr. Fell on 07/27/2024 at which time clearance can be addressed in case there are any issues that would impact surgical recommendations.  Colonoscopy is not scheduled until 08/04/2024 as below. I added preop FYI to appointment note so that provider is aware to address at time of outpatient visit.  Per office protocol the cardiology provider should forward their finalized clearance decision and recommendations regarding antiplatelet therapy to the requesting party below.    I will route this message as FYI to requesting party and remove this message from the preop box as separate preop APP input not needed at this time.   Please call with any questions.  Keith LITTIE Louis, NP  07/26/2024, 10:48 AM

## 2024-07-26 NOTE — Telephone Encounter (Signed)
 [facility] Pre-operative Risk Assessment     Request for surgical clearance:     Endoscopy Procedure  What type of surgery is being performed?     Colonoscopy  When is this surgery scheduled?     08/04/2024  What type of clearance is required ?   Medical  Are there any medications that need to be held prior to surgery and how long?  No  Practice name and name of physician performing surgery?      [facility] Dr. Victory Brand  What is your office phone and fax number?      Phone- [phone]  Fax- [phone]  Anesthesia type (None, local, MAC, general) ?   MAC   Please route your response to Daphne Moats, RN

## 2024-07-27 ENCOUNTER — Ambulatory Visit: Attending: Cardiology | Admitting: Cardiovascular Disease

## 2024-07-27 ENCOUNTER — Telehealth: Payer: Self-pay | Admitting: Gastroenterology

## 2024-07-27 ENCOUNTER — Encounter: Payer: Self-pay | Admitting: Cardiovascular Disease

## 2024-07-27 VITALS — BP 92/60 | HR 83 | Resp 16 | Ht 75.0 in | Wt 328.0 lb

## 2024-07-27 DIAGNOSIS — I2510 Atherosclerotic heart disease of native coronary artery without angina pectoris: Secondary | ICD-10-CM

## 2024-07-27 DIAGNOSIS — E782 Mixed hyperlipidemia: Secondary | ICD-10-CM

## 2024-07-27 DIAGNOSIS — I10 Essential (primary) hypertension: Secondary | ICD-10-CM

## 2024-07-27 NOTE — Telephone Encounter (Addendum)
 Procedure:Colonoscopy Procedure date: 08/04/24 Procedure location: [facility] Arrival Time: 9:00 am Spoke with the patient Y/N: Yes Any prep concerns? No  Has the patient obtained the prep from the pharmacy ? Pt states he is to be admitted the day before the procedure and have prep there Do you have a care partner and transportation: Yes Any additional concerns? No

## 2024-07-27 NOTE — Telephone Encounter (Signed)
 First, his admission plan needs to be adjusted from what I had originally told Brandi regarding the patient going to the admitting department the day before his procedure. I spoke with Joen about it several weeks ago, and she told me that instead we need to contact the better control office and book a bed for him.  She was going to speak with Daphne about that but it seems that did not occur.  So that needs to be taken care of first.  Second, as regards his questions:  He will have an enema prep to start after admission, then he needs an oral bowel preparation because that is the only way this works.  That is why he is being admitted, so the nursing assistant staff can help him manage the prep.  It will be a split dose GoLytely prep (majority the evening before, some early the morning of the procedure).   Positive Cologuard test, like he has, most commonly means there are 1 or more precancerous polyps.  There are also false positives (perhaps up to 15% of the time), and less than 5% of the time it will mean a colon cancer is discovered at the time of colonoscopy.  If polyps are found, they are nearly always removed at the time of the procedure.  That is the best I can answer his questions with the information that we have at this point.  - H. Danis

## 2024-07-27 NOTE — Patient Instructions (Signed)
 Medication Instructions:  No medication changes were made at this visit. Continue current regimen.   *If you need a refill on your cardiac medications before your next appointment, please call your pharmacy*  Lab Work: None ordered today. If you have labs (blood work) drawn today and your tests are completely normal, you will receive your results only by: MyChart Message (if you have MyChart) OR A paper copy in the mail If you have any lab test that is abnormal or we need to change your treatment, we will call you to review the results.  Testing/Procedures: None ordered today.  Follow-Up: At [facility], you and your health needs are our priority.  As part of our continuing mission to provide you with exceptional heart care, our providers are all part of one team.  This team includes your primary Cardiologist (physician) and Advanced Practice Providers or APPs (Physician Assistants and Nurse Practitioners) who all work together to provide you with the care you need, when you need it.  Your next appointment:   1 year(s)  Provider:   Ozell Fell, MD

## 2024-07-27 NOTE — Progress Notes (Unsigned)
 Cardiology Office Note:    Date:  07/28/2024   ID:  Keith Morrow, DOB 1971-10-13, MRN [id number]  PCP:  Domenica Harlene LABOR, MD   [facility] Providers Cardiologist:  Ozell Fell, MD     Referring MD: Domenica Harlene LABOR, MD   Chief Complaint  Patient presents with   Coronary Artery Disease    History of Present Illness:    Keith Morrow is a 52 y.o. male with a hx of multiple sclerosis, hypertension, mixed hyperlipidemia, and coronary artery disease, presenting for follow-up evaluation.  The patient initially presented in 2012 with non-STEMI and was treated with stenting of the LAD, angioplasty of diagonal, and staged PCI of the posterior AV segment of the RCA.  LVEF was noted to be preserved.   The patient is here alone today.  He is doing fine from a cardiac perspective and denies any chest pain, chest pressure, or shortness of breath.  He has chronic leg swelling is improved with use of a newer lymphedema pump.  Reports no cardiac-related concerns today.  His multiple sclerosis has progressed significantly over the past several years.  He is currently trying to make a decision about whether to proceed with a colonoscopy to follow-up on an abnormal Cologuard in the context of his current disability and state of his multiple sclerosis.   Current Medications: Current Meds  Medication Sig   aspirin  EC 81 MG tablet Take 81 mg by mouth daily.   atorvastatin  (LIPITOR) 40 MG tablet TAKE 1 TABLET DAILY   BACLOFEN  IT 1 Dose by Intrathecal route as directed. Lioresal  1842mcg/459.9mcg daily, Clonidine 466mcg/109.65mcg daily/hydromorphone  451mcg/104.4mcg daily/baclofen  459.45mcg/29.9mcg daily   busPIRone  (BUSPAR ) 10 MG tablet TAKE 1 TABLET IN THE MORNING, AT NOON, IN THE EVENING AND AT BEDTIME   clonazePAM  (KLONOPIN ) 1 MG tablet Take 1 tablet (1 mg total) by mouth 4 (four) times daily.   cyclobenzaprine  (FLEXERIL ) 10 MG tablet TAKE 1 TABLET BY MOUTH THREE TIMES A DAY AS NEEDED FOR  MUSCLE SPASMS   CYMBALTA  60 MG capsule Take 1 capsule (60 mg total) by mouth 2 (two) times daily.   dalfampridine  10 MG TB12 Take 1 tablet (10 mg total) by mouth every 12 (twelve) hours.   Fe Fum-FA-B Cmp-C-Zn-Mg-Mn-Cu (HEMOCYTE-PLUS) 106-1 MG TABS Take time daily.   furosemide  (LASIX ) 20 MG tablet TAKE 1 TABLET (20 MG TOTAL) BY MOUTH DAILY X 5 DAYS, THEN AS NEEDED FOR FOR SHORTNESS OF BREATH/WEIGHT GAIN>3#, PEDAL EDEMA AND CAN TAKE A SECOND TAB DAILY AS NEEDED   gabapentin  (NEURONTIN ) 600 MG tablet Take 1 tablet (600 mg total) by mouth 4 (four) times daily.   ketoconazole  (NIZORAL ) 2 % shampoo APPLY TOPICALLY 2 TIMES A WEEK   levothyroxine  (SYNTHROID ) 137 MCG tablet TAKE 1 TABLET DAILY BEFORE BREAKFAST   methylphenidate  (RITALIN ) 10 MG tablet Take 1 tablet (10 mg total) by mouth 4 (four) times daily.   natalizumab (TYSABRI) 300 MG/15ML injection Inject 300 mg into the vein every 28 (twenty-eight) days.    nitroGLYCERIN  (NITROSTAT ) 0.4 MG SL tablet PLACE 1 TABLET UNDER THE TOUNGE EVERY 5 MINUTES AS NEEDED FOR CHEST PAIN (X 3 DOSES)   nystatin  cream (MYCOSTATIN ) Apply 1 application  topically as directed.   olmesartan -hydrochlorothiazide (BENICAR  HCT) 20-12.5 MG tablet TAKE 1 TABLET DAILY   pantoprazole  (PROTONIX ) 40 MG tablet TAKE 1 TABLET BY MOUTH TWICE A DAY   potassium chloride  SA (KLOR-CON  M20) 20 MEQ tablet TAKE 1 TABLET BY MOUTH EVERY DAY AS NEEDED WHEN TAKING  A SECOND LASIX  TAB ANY GIVEN DAY   sucralfate  (CARAFATE ) 1 g tablet TAKE 1 TABLET FOUR TIMES A DAY   sucralfate  (CARAFATE ) 1 GM/10ML suspension Take 10 mLs (1 g total) by mouth 4 (four) times daily -  with meals and at bedtime. Take liquid or tablets but not both   tamsulosin  (FLOMAX ) 0.4 MG CAPS capsule TAKE 1 CAPSULE DAILY     Allergies:   Patient has no known allergies.   ROS:   Please see the history of present illness.    All other systems reviewed and are negative.  EKGs/Labs/Other Studies Reviewed:    The following  studies were reviewed today: Cardiac Studies & Procedures   ______________________________________________________________________________________________   STRESS TESTS  MYOCARDIAL PERFUSION IMAGING 12/20/2015  Interpretation Summary  Nuclear stress EF: 59%. Akinesis in the basal and mid inferior walls and paradoxical septal motion.  There was no ST segment deviation noted during stress.  Defect 1: There is a medium defect of moderate severity present in the basal inferior, mid inferior and apical inferior location.  The study is normal.  Findings consistent with prior myocardial infarction with peri-infarct ischemia.  This is an intermediate risk study.  The left ventricular ejection fraction is mildly decreased (45-54%).  There is a scar in the basal and mid inferior walls with very mild ischemia in the mid and apical inferior walls (SDS 2).   ECHOCARDIOGRAM  ECHOCARDIOGRAM COMPLETE 01/11/2020  Narrative ECHOCARDIOGRAM REPORT    Patient Name:   Keith Morrow Date of Exam: 01/11/2020 Medical Rec #:  [id number]      Height:       76.0 in Accession #:    [id number]     Weight:       299.4 lb Date of Birth:  02/18/1971      BSA:          2.63 m Patient Age:    48 years       BP:           142/79 mmHg Patient Gender: M              HR:           72 bpm. Exam Location:  [place]  Procedure: 2D Echo, Cardiac Doppler and Color Doppler  Indications:    Dyspnea  History:        Patient has no prior history of Echocardiogram examinations. CAD and Previous Myocardial Infarction, Signs/Symptoms:Dyspnea; Risk Factors:Hypertension and Dyslipidemia.  Sonographer:    Fairy Canton RDCS (AE) Referring Phys: 29 STACEY A BLYTH  IMPRESSIONS   1. Left ventricular ejection fraction, by visual estimation, is 60 to 65%. The left ventricle has normal function. There is no left ventricular hypertrophy. 2. The left ventricle has no regional wall motion abnormalities. 3. Global  right ventricle has normal systolic function.The right ventricular size is normal. No increase in right ventricular wall thickness. 4. Left atrial size was normal. 5. Right atrial size was normal. 6. The mitral valve is normal in structure. Trivial mitral valve regurgitation. No evidence of mitral stenosis. 7. The tricuspid valve is normal in structure. Tricuspid valve regurgitation is not demonstrated. 8. The aortic valve is normal in structure. Aortic valve regurgitation is trivial. No evidence of aortic valve sclerosis or stenosis. 9. The pulmonic valve was not well visualized. Pulmonic valve regurgitation is not visualized. 10. Unable to determine Pulmonary Artery Systolic Pressure, No TR jet spectral display.  FINDINGS Left Ventricle: Left ventricular ejection fraction, by  visual estimation, is 60 to 65%. The left ventricle has normal function. The left ventricle has no regional wall motion abnormalities. The left ventricular internal cavity size was the left ventricle is normal in size. There is no left ventricular hypertrophy. Left ventricular diastolic parameters were normal. Normal left atrial pressure.  Right Ventricle: The right ventricular size is normal. No increase in right ventricular wall thickness. Global RV systolic function is has normal systolic function.  Left Atrium: Left atrial size was normal in size.  Right Atrium: Right atrial size was normal in size  Pericardium: There is no evidence of pericardial effusion.  Mitral Valve: The mitral valve is normal in structure. Trivial mitral valve regurgitation. No evidence of mitral valve stenosis by observation.  Tricuspid Valve: The tricuspid valve is normal in structure. Tricuspid valve regurgitation is not demonstrated.  Aortic Valve: The aortic valve is normal in structure. Aortic valve regurgitation is trivial. The aortic valve is structurally normal, with no evidence of sclerosis or stenosis.  Pulmonic Valve: The  pulmonic valve was not well visualized. Pulmonic valve regurgitation is not visualized. Pulmonic regurgitation is not visualized.  Aorta: The aortic root, ascending aorta and aortic arch are all structurally normal, with no evidence of dilitation or obstruction.  Venous: The inferior vena cava is normal in size with greater than 50% respiratory variability, suggesting right atrial pressure of 3 mmHg.  IAS/Shunts: No atrial level shunt detected by color flow Doppler. There is no evidence of a patent foramen ovale. No ventricular septal defect is seen or detected. There is no evidence of an atrial septal defect.   LEFT VENTRICLE PLAX 2D LVIDd:         5.31 cm  Diastology LVIDs:         2.79 cm  LV e' lateral:   13.10 cm/s LV PW:         1.04 cm  LV E/e' lateral: 6.2 LV IVS:        1.03 cm  LV e' medial:    10.40 cm/s LVOT diam:     2.30 cm  LV E/e' medial:  7.8 LV SV:         107 ml LV SV Index:   38.89 LVOT Area:     4.15 cm   RIGHT VENTRICLE             IVC RV Basal diam:  3.81 cm     IVC diam: 1.72 cm RV S prime:     14.50 cm/s TAPSE (M-mode): 2.5 cm  LEFT ATRIUM             Index       RIGHT ATRIUM           Index LA diam:        3.80 cm 1.45 cm/m  RA Area:     19.10 cm LA Vol (A2C):   88.4 ml 33.64 ml/m RA Volume:   53.50 ml  20.35 ml/m LA Vol (A4C):   52.2 ml 19.85 ml/m LA Biplane Vol: 69.4 ml 26.39 ml/m AORTIC VALVE LVOT Vmax:   80.40 cm/s LVOT Vmean:  54.800 cm/s LVOT VTI:    0.177 m  AORTA Ao Root diam: 3.90 cm Ao Asc diam:  3.30 cm  MITRAL VALVE MV Area (PHT): 3.54 cm             SHUNTS MV PHT:        62.06 msec           Systemic  VTI:  0.18 m MV Decel Time: 214 msec             Systemic Diam: 2.30 cm MV E velocity: 80.60 cm/s 103 cm/s MV A velocity: 65.60 cm/s 70.3 cm/s MV E/A ratio:  1.23       1.5   Kardie Tobb DO Electronically signed by Dub Huntsman DO Signature Date/Time: 01/11/2020/11:05:07 AM    Final           ______________________________________________________________________________________________      EKG:   EKG Interpretation Date/Time:  Wednesday July 27 2024 15:00:37 EDT Ventricular Rate:  103 PR Interval:  172 QRS Duration:  88 QT Interval:  394 QTC Calculation: 516 R Axis:   61  Text Interpretation: Sinus tachycardia Low voltage QRS Nonspecific T wave abnormality When compared with ECG of 21-Apr-2023 14:22, PREVIOUS ECG IS PRESENT the heart rate has increased from 70 bpm to 103 bpm. otherwise no significant change. Confirmed by Wonda Sharper [phone]) on 07/27/2024 3:20:39 PM    Recent Labs: 06/06/2024: ALT 18; BUN 16; Creatinine, Ser 0.95; Hemoglobin 12.4; Platelets 210.0; Potassium 4.0; Sodium 141; TSH 1.72  Recent Lipid Panel    Component Value Date/Time   CHOL 136 06/06/2024 1542   CHOL 159 04/02/2021 0916   TRIG 63.0 06/06/2024 1542   HDL 44.60 06/06/2024 1542   HDL 65 04/02/2021 0916   CHOLHDL 3 06/06/2024 1542   VLDL 12.6 06/06/2024 1542   LDLCALC 79 06/06/2024 1542   LDLCALC 82 04/02/2021 0916     Risk Assessment/Calculations:                Physical Exam:    VS:  BP 92/60 (BP Location: Right Arm, Patient Position: Sitting, Cuff Size: Large)   Pulse 83   Resp 16   Ht 6' 3 (1.905 m)   Wt (!) 328 lb (148.8 kg)   SpO2 94%   BMI 41.00 kg/m     Wt Readings from Last 3 Encounters:  07/27/24 (!) 328 lb (148.8 kg)  06/06/24 (!) 328 lb 9.6 oz (149.1 kg)  03/29/24 (!) 350 lb (158.8 kg)     GEN: Obese male in no acute distress HEENT: Normal NECK: No JVD; No carotid bruits LYMPHATICS: No lymphadenopathy CARDIAC: RRR, no murmurs, rubs, gallops RESPIRATORY:  Clear to auscultation without rales, wheezing or rhonchi  ABDOMEN: Soft, non-tender, non-distended MUSCULOSKELETAL: 1+ bilateral pretibial edema; No deformity  SKIN: Warm and dry NEUROLOGIC:  Alert and oriented x 3 PSYCHIATRIC:  Normal affect   Assessment & Plan Coronary artery disease  involving native coronary artery of native heart without angina pectoris Stable without symptoms of angina.  Continue aspirin  and atorvastatin  at current doses.  Follow-up 1 year. Essential hypertension Blood pressure is controlled.  Patient treated with Benicar  HCT.  Advised him that if he starts seeing systolic readings less than 90 mmHg, we will need to back off on his antihypertensive drugs.  He has no symptoms and would like to continue on his current regimen. Mixed hyperlipidemia Treated with atorvastatin .  LDL cholesterol is 79.  Continue current management.            Medication Adjustments/Labs and Tests Ordered: Current medicines are reviewed at length with the patient today.  Concerns regarding medicines are outlined above.  Orders Placed This Encounter  Procedures   EKG 12-Lead   No orders of the defined types were placed in this encounter.   Patient Instructions  Medication Instructions:  No medication changes were made at this  visit. Continue current regimen.   *If you need a refill on your cardiac medications before your next appointment, please call your pharmacy*  Lab Work: None ordered today. If you have labs (blood work) drawn today and your tests are completely normal, you will receive your results only by: MyChart Message (if you have MyChart) OR A paper copy in the mail If you have any lab test that is abnormal or we need to change your treatment, we will call you to review the results.  Testing/Procedures: None ordered today.  Follow-Up: At [facility], you and your health needs are our priority.  As part of our continuing mission to provide you with exceptional heart care, our providers are all part of one team.  This team includes your primary Cardiologist (physician) and Advanced Practice Providers or APPs (Physician Assistants and Nurse Practitioners) who all work together to provide you with the care you need, when you need it.  Your next  appointment:   1 year(s)  Provider:   Ozell Fell, MD      Signed, Ozell Fell, MD  07/28/2024 11:56 AM    [facility]

## 2024-07-28 ENCOUNTER — Telehealth: Payer: Self-pay | Admitting: *Deleted

## 2024-07-28 NOTE — Assessment & Plan Note (Signed)
 Treated with atorvastatin .  LDL cholesterol is 79.  Continue current management.

## 2024-07-28 NOTE — Telephone Encounter (Signed)
 Faxed signed order back [phone]. Received fax confirmation.

## 2024-07-28 NOTE — Telephone Encounter (Signed)
   Primary Cardiologist: Ozell Fell, MD  Chart reviewed as part of pre-operative protocol coverage. Given past medical history and time since last visit, based on ACC/AHA guidelines, Keith Morrow would be at acceptable risk for the planned procedure without further cardiovascular testing.   Patient should contact our office if he is having new symptoms that are concerning from a cardiac perspective to arrange a follow-up appointment.    I will route this recommendation to the requesting party via Epic fax function and remove from pre-op pool.  Please call with questions.  Rosaline EMERSON Bane, NP-C  07/28/2024, 5:03 PM [address], [address] [place], [place] [address] Office [phone] Fax [phone]

## 2024-07-28 NOTE — Assessment & Plan Note (Signed)
 Stable without symptoms of angina.  Continue aspirin  and atorvastatin  at current doses.  Follow-up 1 year.

## 2024-07-29 ENCOUNTER — Ambulatory Visit (INDEPENDENT_AMBULATORY_CARE_PROVIDER_SITE_OTHER): Admitting: Psychology

## 2024-07-29 DIAGNOSIS — F331 Major depressive disorder, recurrent, moderate: Secondary | ICD-10-CM

## 2024-07-29 NOTE — Progress Notes (Signed)
  Behavioral Health Counselor/Therapist Progress Note  Patient ID: Keith Morrow, MRN: [id number],    Date: 07/29/2024  Time Spent: 11:00am-11:55am  55 minutes   Treatment Type: Individual Therapy  Reported Symptoms: stress  Mental Status Exam: Appearance:  Casual     Behavior: Appropriate  Motor: Normal  Speech/Language:  Normal Rate  Affect: Appropriate  Mood: normal  Thought process: normal  Thought content:   WNL  Sensory/Perceptual disturbances:   WNL  Orientation: oriented to person, place, time/date, and situation  Attention: Good  Concentration: Good  Memory: WNL  Fund of knowledge:  Good  Insight:   Good  Judgment:  Good  Impulse Control: Good   Risk Assessment: Danger to Self:  No Self-injurious Behavior: No Danger to Others: No Duty to Warn:no Physical Aggression / Violence:No  Access to Firearms a concern: No  Gang Involvement:No   Subjective: Pt present for face-to-face individual therapy via video.  Pt consents to telehealth video session and is aware of limitations and benefits of virtual sessions.  Location of pt: home Location of therapist: home office.   Pt talked about work.  He continues to have to work long hours bc of big projects they are working on.      Pt is feeling more tired and stressed with having to go into the office.  Pt gets more exhausted.   Pt has a work trip next month in [place].  Pt will drive there which is a big long trip.  Worked on [employer], dispensing.  Addressed how he can take care of himself .   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  Pt talked about his health.  He had an infusion today.  He has to have infusions once a month.  Pt has to have a colonoscopy next week and he is anxious about it.   Addressed pt's concerns.   Pt talked about his family.   His son Debby is in flight training and is living with his girlfriend of 2 and a half years.  The  girlfriend is also in flight school.   Pt's son Elsie will be graduating in December.  He will then apply to PA school.   Pt had visits from [name]'s family and there were some stress with [name]'s mom's sister.  Addressed the interactions and how they impacted pt.  Pt and [name] set boundaries that were needed.   Provided supportive therapy.    Interventions: Cognitive Behavioral Therapy and Insight-Oriented  Diagnosis:  F33.1  Plan of Care: Recommend ongoing therapy.  Pt participated in setting treatment goals.  Plan to meet monthly.   Pt agrees with treatment plan.    Treatment Plan  (Treatment Plan Target Date: 04/29/2025) Client Abilities/Strengths  Pt is bright, engaging, and motivated for therapy.   Client Treatment Preferences  Individual therapy.  Client Statement of Needs  Improve coping skills.  Symptoms  Depressed or irritable mood. Feelings of hopelessness, worthlessness, or inappropriate guilt. Low self-esteem.  Problems Addressed  Unipolar Depression Goals 1. Alleviate depressive symptoms and return to previous level of effective functioning. 2. Appropriately grieve the loss in order to normalize mood and to return to previously adaptive level of functioning. Objective Learn and implement behavioral strategies to overcome depression. Target Date: 2025-04-29 Frequency: Monthly  Progress: 34 Modality: individual  Related Interventions Engage the client in behavioral activation, increasing his/her activity level and contact with sources of reward, while identifying processes that  inhibit activation.  Use behavioral techniques such as instruction, rehearsal, role-playing, role reversal, as needed, to facilitate activity in the client's daily life; reinforce success. Assist the client in developing skills that increase the likelihood of deriving pleasure from behavioral activation (e.g., assertiveness skills, developing an exercise plan, less internal/more external focus,  increased social involvement); reinforce success. Objective Identify important people in life, past and present, and describe the quality, good and poor, of those relationships. Target Date: 2025-04-29 Frequency: Monthly  Progress: 65 Modality: individual  Related Interventions Conduct Interpersonal Therapy beginning with the assessment of the client's interpersonal inventory of important past and present relationships; develop a case formulation linking depression to grief, interpersonal role disputes, role transitions, and/or interpersonal deficits). Objective Learn and implement problem-solving and decision-making skills. Target Date: 2025-04-29 Frequency: Monthly  Progress: 65 Modality: individual  Related Interventions Conduct Problem-Solving Therapy using techniques such as psychoeducation, modeling, and role-playing to teach client problem-solving skills (i.e., defining a problem specifically, generating possible solutions, evaluating the pros and cons of each solution, selecting and implementing a plan of action, evaluating the efficacy of the plan, accepting or revising the plan); role-play application of the problem-solving skill to a real life issue. Encourage in the client the development of a positive problem orientation in which problems and solving them are viewed as a natural part of life and not something to be feared, despaired, or avoided. 3. Develop healthy interpersonal relationships that lead to the alleviation and help prevent the relapse of depression. 4. Develop healthy thinking patterns and beliefs about self, others, and the world that lead to the alleviation and help prevent the relapse of depression. 5. Recognize, accept, and cope with feelings of depression. Diagnosis F33.1  Conditions For Discharge Achievement of treatment goals and objectives   Veva Alma, LCSW

## 2024-08-03 ENCOUNTER — Other Ambulatory Visit: Payer: Self-pay

## 2024-08-03 ENCOUNTER — Encounter (HOSPITAL_COMMUNITY): Payer: Self-pay | Admitting: Gastroenterology

## 2024-08-03 ENCOUNTER — Observation Stay (HOSPITAL_COMMUNITY)
Admission: RE | Admit: 2024-08-03 | Discharge: 2024-08-04 | Disposition: A | Discharge status: Home / Self Care | Attending: Gastroenterology | Admitting: Gastroenterology

## 2024-08-03 DIAGNOSIS — K317 Polyp of stomach and duodenum: Secondary | ICD-10-CM | POA: Diagnosis not present

## 2024-08-03 DIAGNOSIS — R195 Other fecal abnormalities: Secondary | ICD-10-CM | POA: Diagnosis not present

## 2024-08-03 DIAGNOSIS — D122 Benign neoplasm of ascending colon: Principal | ICD-10-CM | POA: Insufficient documentation

## 2024-08-03 DIAGNOSIS — D509 Iron deficiency anemia, unspecified: Principal | ICD-10-CM

## 2024-08-03 LAB — CBC
HCT: 38.5 % — ABNORMAL LOW (ref 39.0–52.0)
Hemoglobin: 12.1 g/dL — ABNORMAL LOW (ref 13.0–17.0)
MCH: 28.6 pg (ref 26.0–34.0)
MCHC: 31.4 g/dL (ref 30.0–36.0)
MCV: 91 fL (ref 80.0–100.0)
Platelets: 181 K/uL (ref 150–400)
RBC: 4.23 MIL/uL (ref 4.22–5.81)
RDW: 13.9 % (ref 11.5–15.5)
WBC: 7.6 K/uL (ref 4.0–10.5)
nRBC: 0 % (ref 0.0–0.2)

## 2024-08-03 LAB — COMPREHENSIVE METABOLIC PANEL WITH GFR
ALT: 18 U/L (ref 0–44)
AST: 17 U/L (ref 15–41)
Albumin: 4.3 g/dL (ref 3.5–5.0)
Alkaline Phosphatase: 101 U/L (ref 38–126)
Anion gap: 12 (ref 5–15)
BUN: 15 mg/dL (ref 6–20)
CO2: 26 mmol/L (ref 22–32)
Calcium: 9.5 mg/dL (ref 8.9–10.3)
Chloride: 101 mmol/L (ref 98–111)
Creatinine, Ser: 0.95 mg/dL (ref 0.61–1.24)
GFR, Estimated: >60 mL/min (ref >60)
Glucose, Bld: 106 mg/dL — ABNORMAL HIGH (ref 70–99)
Potassium: 4.2 mmol/L (ref 3.5–5.1)
Sodium: 139 mmol/L (ref 135–145)
Total Bilirubin: 0.7 mg/dL (ref 0.0–1.2)
Total Protein: 6.9 g/dL (ref 6.5–8.1)

## 2024-08-03 LAB — IRON AND TIBC
Iron: 69 ug/dL (ref 45–182)
Saturation Ratios: 21 % (ref 17.9–39.5)
TIBC: 335 ug/dL (ref 250–450)
UIBC: 266 ug/dL

## 2024-08-03 MED ORDER — DULOXETINE HCL 30 MG PO CPEP
60.0000 mg | ORAL_CAPSULE | Freq: Two times a day (BID) | ORAL | Status: DC
  Administered 2024-08-04 (×2): 60 mg via ORAL
  Filled 2024-08-03 (×2): qty 2

## 2024-08-03 MED ORDER — PANTOPRAZOLE SODIUM 40 MG PO TBEC
40.0000 mg | DELAYED_RELEASE_TABLET | Freq: Two times a day (BID) | ORAL | Status: DC
  Administered 2024-08-04 (×2): 40 mg via ORAL
  Filled 2024-08-03 (×2): qty 1

## 2024-08-03 MED ORDER — NITROGLYCERIN 0.4 MG SL SUBL: 0.4000 mg | SUBLINGUAL_TABLET | SUBLINGUAL | Status: DC | PRN

## 2024-08-03 MED ORDER — SODIUM CHLORIDE 0.9 % IV SOLN: 250.0000 mL | INTRAVENOUS | Status: AC | PRN

## 2024-08-03 MED ORDER — SODIUM CHLORIDE 0.9% FLUSH: 3.0000 mL | INTRAVENOUS | Status: DC | PRN

## 2024-08-03 MED ORDER — FLEET ENEMA RE ENEM
1.0000 | ENEMA | Freq: Once | RECTAL | Status: AC
  Administered 2024-08-03: 1 via RECTAL
  Filled 2024-08-03: qty 1

## 2024-08-03 MED ORDER — SODIUM CHLORIDE 0.9 % IV SOLN: INTRAVENOUS | Status: DC

## 2024-08-03 MED ORDER — GABAPENTIN 300 MG PO CAPS
600.0000 mg | ORAL_CAPSULE | Freq: Four times a day (QID) | ORAL | Status: DC
  Administered 2024-08-04 (×2): 600 mg via ORAL
  Filled 2024-08-03 (×2): qty 2

## 2024-08-03 MED ORDER — SODIUM CHLORIDE 0.9% FLUSH
3.0000 mL | Freq: Two times a day (BID) | INTRAVENOUS | Status: DC
  Administered 2024-08-03 – 2024-08-04 (×3): 3 mL via INTRAVENOUS

## 2024-08-03 MED ORDER — BUSPIRONE HCL 10 MG PO TABS
10.0000 mg | ORAL_TABLET | Freq: Three times a day (TID) | ORAL | Status: DC
  Administered 2024-08-04 (×2): 10 mg via ORAL
  Filled 2024-08-03 (×2): qty 1

## 2024-08-03 MED ORDER — CYCLOBENZAPRINE HCL 10 MG PO TABS: 10.0000 mg | ORAL_TABLET | Freq: Three times a day (TID) | ORAL | Status: DC | PRN

## 2024-08-03 MED ORDER — PANTOPRAZOLE SODIUM 40 MG PO TBEC: 40.0000 mg | DELAYED_RELEASE_TABLET | Freq: Two times a day (BID) | ORAL | Status: DC

## 2024-08-03 MED ORDER — CLONAZEPAM 1 MG PO TABS
1.0000 mg | ORAL_TABLET | Freq: Four times a day (QID) | ORAL | Status: DC
Start: 2024-08-03 — End: 2024-08-04
  Administered 2024-08-04 (×2): 1 mg via ORAL
  Filled 2024-08-03 (×2): qty 1

## 2024-08-03 MED ORDER — PEG 3350-KCL-NA BICARB-NACL 420 G PO SOLR
4000.0000 mL | Freq: Once | ORAL | Status: AC
  Administered 2024-08-03: 4000 mL via ORAL

## 2024-08-03 NOTE — H&P (Addendum)
 Primary Care Physician:  Domenica Harlene LABOR, MD Primary Gastroenterologist:  Dr. Victory Brand   CHIEF COMPLAINT:  Positive Cologuard Test, colonoscopy   HPI: Keith Morrow is a 53 y.o. male medical history of anxiety, depression, hypertension, hyperlipidemia, coronary artery disease s/p NSTEMI s/p stent placement to the LAD and PCI to the RCA, hypothyroidism, IDA, obesity, sleep apnea and multiple sclerosis. He underwent a Cologuard test which was positive 12/09/2023. Due to his progressive MS, he was admitted to the hospital today to assist with the colonoscopy bowel prep for colonoscopy scheduled 08/04/2024. Recent laboratory studies 06/06/2024 showed mild IDA with low ferritin levels for the past year therefore an EGD to be completed at the time of his colonoscopy.  He has chronic GERD symptoms despite taking Pantoprazole  40 mg once daily and Carafate  and calcium  carbonate as needed.  He denies having any dysphagia. No nausea or vomiting.  No abdominal pain.  He has taken MiraLAX nightly for the past week with increased stool output, he passed a moderate amount of normal formed stool yesterday evening with relief.  No bloody or black stools.  He denies ever having a screening colonoscopy.  No known family history of colorectal cancer.  He underwent an EGD 06/2017 which showed a small hiatal hernia otherwise was normal.   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  He is a full code.  Labs 06/07/2023: Hg 12.4. HCT 37. MCV 88.6. PLT 210   ECHO 01/11/2020:  1. Left ventricular ejection fraction, by visual estimation, is 60 to 65%. The left ventricle has normal function. There is no left ventricular hypertrophy. 2. The left ventricle has no regional wall motion abnormalities. 3. Global right ventricle has normal systolic function.The right ventricular size is normal. No increase in right ventricular wall thickness. 4. Left atrial size was normal. 5. Right atrial size was normal. 6. The mitral valve is  normal in structure. Trivial mitral valve regurgitation. No evidence of mitral stenosis. 7. The tricuspid valve is normal in structure. Tricuspid valve regurgitation is not demonstrated. 8. The aortic valve is normal in structure. Aortic valve regurgitation is trivial. No evidence of aortic valve sclerosis or stenosis. 9. The pulmonic valve was not well visualized. Pulmonic valve regurgitation is not visualized. 10. Unable to determine Pulmonary Artery Systolic Pressure, No TR jet spectral display   EGD 06/11/2017:  - Normal larynx.  - Small hiatal hernia.  - Normal stomach.  - Normal examined duodenum.  - No specimens collected.   Past Medical History:  Diagnosis Date   Acute bronchitis 05/09/2013   Allergy    Bronchitis, acute 12/10/2011   Candidal skin infection 05/05/2017   Chicken pox as a child   Constipation 02/20/2017   Coronary atherosclerosis of native coronary artery    a. NSTEMI 04/20/11: tx with Promus DES to LAD; bifurcation lesion with oDx 90% tx with POBA; b. staged PCI of right post. AV Branch with Promus DES; c. 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   Visual changes 11/13/2016    Past Surgical History:  Procedure Laterality Date   CORONARY ANGIOPLASTY WITH STENT PLACEMENT     2012   PAIN PUMP IMPLANTATION N/A 08/29/2014   Procedure: Baclofen  pump placement;  Surgeon: Fairy Levels, MD;  Location: [facility];   Service: Neurosurgery;  Laterality: N/A;  Baclofen  pump placement   PAIN PUMP IMPLANTATION Right 09/20/2020   Procedure: Right Baclofen  pump replacement;  Surgeon: Levels Fairy, MD;  Location: [facility] OR;  Service: Neurosurgery;  Laterality: Right;  Right Baclofen  pump replacement   PROGRAMABLE BACLOFEN  PUMP REVISION  08/29/14   right wrist surgery     CTR   VASCULAR SURGERY     vericose vein in right femoral area   VASECTOMY     WISDOM TOOTH EXTRACTION      Prior to Admission medications   Medication Sig Start Date End Date Taking? Authorizing Provider  aspirin  EC 81 MG tablet Take 81 mg by mouth daily.    [provider]  atorvastatin  (LIPITOR) 40 MG tablet TAKE 1 TABLET DAILY 04/18/24   Wonda Sharper, MD  BACLOFEN  IT 1 Dose by Intrathecal route as directed. Lioresal  1884mcg/459.9mcg daily, Clonidine 481mcg/109.65mcg daily/hydromorphone  410mcg/104.4mcg daily/baclofen  459.4mcg/29.9mcg daily    [provider]  busPIRone  (BUSPAR ) 10 MG tablet TAKE 1 TABLET IN THE MORNING, AT NOON, IN THE EVENING AND AT BEDTIME 04/18/24   Domenica Harlene LABOR, MD  clonazePAM  (KLONOPIN ) 1 MG tablet Take 1 tablet (1 mg total) by mouth 4 (four) times daily. 03/12/23   Sater, Charlie LABOR, MD  cyclobenzaprine  (FLEXERIL ) 10 MG tablet TAKE 1 TABLET BY MOUTH THREE TIMES A DAY AS NEEDED FOR MUSCLE SPASMS 06/08/23   Sater, Charlie LABOR, MD  CYMBALTA  60 MG capsule Take 1 capsule (60 mg total) by mouth 2 (two) times daily. 10/12/23   Domenica Harlene LABOR, MD  dalfampridine  10 MG TB12 Take 1 tablet (10 mg total) by mouth every 12 (twelve) hours. 01/12/24   Sater, Charlie LABOR, MD  famotidine  (PEPCID ) 40 MG tablet TAKE 1 TABLET DAILY AT BEDTIME Patient not taking: Reported on 07/27/2024 03/25/22   Domenica Harlene LABOR, MD  Fe Fum-FA-B Cmp-C-Zn-Mg-Mn-Cu (HEMOCYTE-PLUS) 106-1 MG TABS Take time daily. 04/16/23   Domenica Harlene LABOR, MD  furosemide  (LASIX ) 20 MG tablet TAKE 1 TABLET (20 MG TOTAL) BY MOUTH DAILY X 5 DAYS, THEN AS NEEDED FOR FOR  SHORTNESS OF BREATH/WEIGHT GAIN>3#, PEDAL EDEMA AND CAN TAKE A SECOND TAB DAILY AS NEEDED 07/20/24   Domenica Harlene LABOR, MD  gabapentin  (NEURONTIN ) 600 MG tablet Take 1 tablet (600 mg total) by mouth 4 (four) times daily. 03/29/24   Sater, Charlie LABOR, MD  ketoconazole  (NIZORAL ) 2 % shampoo APPLY TOPICALLY 2 TIMES A WEEK 07/27/23   Domenica Harlene LABOR, MD  levothyroxine  (SYNTHROID ) 137 MCG tablet TAKE 1 TABLET DAILY BEFORE BREAKFAST 03/31/24   Domenica Harlene LABOR, MD  methylphenidate  (RITALIN ) 10 MG tablet Take 1 tablet (10 mg total) by mouth 4 (four) times daily. 03/29/24   Sater, Charlie LABOR, MD  natalizumab (TYSABRI) 300 MG/15ML injection Inject 300 mg into the vein every 28 (twenty-eight) days.     [provider]  nitroGLYCERIN  (NITROSTAT ) 0.4 MG SL tablet PLACE 1 TABLET UNDER THE TOUNGE EVERY 5 MINUTES AS NEEDED FOR CHEST PAIN (X 3 DOSES) 04/28/23   Wonda Sharper,  MD  nystatin  cream (MYCOSTATIN ) Apply 1 application  topically as directed. 05/20/22   Domenica Harlene LABOR, MD  olmesartan -hydrochlorothiazide (BENICAR  HCT) 20-12.5 MG tablet TAKE 1 TABLET DAILY 04/18/24   Domenica Harlene LABOR, MD  pantoprazole  (PROTONIX ) 40 MG tablet TAKE 1 TABLET BY MOUTH TWICE A DAY 04/05/24   Legrand Victory LITTIE DOUGLAS, MD  potassium chloride  SA (KLOR-CON  M20) 20 MEQ tablet TAKE 1 TABLET BY MOUTH EVERY DAY AS NEEDED WHEN TAKING A SECOND LASIX  TAB ANY GIVEN DAY 04/02/21   Parthenia Olivia HERO, PA-C  sucralfate  (CARAFATE ) 1 g tablet TAKE 1 TABLET FOUR TIMES A DAY 10/14/23   Domenica Harlene LABOR, MD  sucralfate  (CARAFATE ) 1 GM/10ML suspension Take 10 mLs (1 g total) by mouth 4 (four) times daily -  with meals and at bedtime. Take liquid or tablets but not both 10/22/21   Domenica Harlene LABOR, MD  tamsulosin  (FLOMAX ) 0.4 MG CAPS capsule TAKE 1 CAPSULE DAILY 03/21/24   Domenica Harlene LABOR, MD    Current Facility-Administered Medications  Medication Dose Route Frequency Provider Last Rate Last Admin   0.9 %  sodium chloride  infusion  250 mL Intravenous PRN  Kennedy-Smith, Colleen M, NP       sodium chloride  flush (NS) 0.9 % injection 3 mL  3 mL Intravenous Q12H Cara Elida HERO, NP       sodium chloride  flush (NS) 0.9 % injection 3 mL  3 mL Intravenous PRN Kennedy-Smith, Colleen M, NP        Allergies as of 06/14/2024   (No Known Allergies)    Family History  Problem Relation Age of Onset   Heart attack Mother 91   Thyroid  disease Mother    Heart disease Mother    Thyroid  disease Sister    Thyroid  disease Brother    Heart disease Maternal Grandmother    Hypertension Maternal Grandmother    Hyperlipidemia Maternal Grandmother    Heart disease Maternal Grandfather    Hypertension Maternal Grandfather    Hyperlipidemia Maternal Grandfather    Stroke Paternal Grandfather    Thyroid  disease Sister    Heart disease Father        2 stents   Coronary artery disease Other        questionable in father    Social History   Socioeconomic History   Marital status: Married    Spouse name: Not on file   Number of children: 3   Years of education: Not on file   Highest education level: Bachelor's degree (e.g., BA, AB, BS)  Occupational History   Not on file  Tobacco Use   Smoking status: Never   Smokeless tobacco: Never  Vaping Use   Vaping status: Never Used  Substance and Sexual Activity   Alcohol use: Yes    Comment: socially   Drug use: No   Sexual activity: Yes    Partners: Female    Comment: 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, works Engineer, maintenance, no dietary restrictions  Other Topics Concern   Not on file  Social History Narrative   Not on file   Social Drivers of Health   Financial Resource Strain: Low Risk  (06/06/2024)   Overall Financial Resource Strain (CARDIA)    Difficulty of Paying Living Expenses: Not hard at all  Food Insecurity: No Food Insecurity (06/06/2024)   Hunger Vital Sign    Worried About Running Out of Food in the Last Year: Never true    Ran Out of Food in the Last  Year: Never true   Transportation Needs: No Transportation Needs (06/06/2024)   PRAPARE - Administrator, Civil Service (Medical): No    Lack of Transportation (Non-Medical): No  Physical Activity: Inactive (06/06/2024)   Exercise Vital Sign    Days of Exercise per Week: 0 days    Minutes of Exercise per Session: Not on file  Stress: Stress Concern Present (06/06/2024)   [employer] of Occupational Health - Occupational Stress Questionnaire    Feeling of Stress: Rather much  Social Connections: Unknown (06/06/2024)   Social Connection and Isolation Panel    Frequency of Communication with Friends and Family: Patient declined    Frequency of Social Gatherings with Friends and Family: Patient declined    Attends Religious Services: Patient declined    Database administrator or Organizations: No    Attends Engineer, structural: Not on file    Marital Status: Married  Catering manager Violence: Not on file    Review of Systems: Gen: No fevers, sweats or obvious weight loss. CV: Denies chest pain, angina, palpitations, syncope, orthopnea, PND, peripheral edema, and claudication. Resp: Denies dyspnea at rest, dyspnea with exercise, cough, sputum, wheezing, coughing up blood, and pleurisy. GI: See HPI. GU : Denies urinary burning, blood in urine, urinary frequency, urinary hesitancy, nocturnal urination, and urinary incontinence. MS:  Wheelchair-bound secondary to progressive MS. Derm: Denies rash, itching, dry skin, hives, moles, warts, or unhealing ulcers.  Psych: + Anxiety and depression. Heme: Denies bruising, bleeding, and enlarged lymph nodes. Neuro:  Progressive MS, paresthesias. Endo:  Denies any problems with DM, thyroid , adrenal function.  Physical Exam: Vital signs in last 24 hours: Temp:  [98.4 F (36.9 C)] 98.4 F (36.9 C) (08/27 1225) Pulse Rate:  [83] 83 (08/27 1225) Resp:  [18] 18 (08/27 1225) BP: (104)/(63) 104/63 (08/27 1225) SpO2:  [98 %] 98 % (08/27  1225)   General: Alert and oriented 53 year old male in no acute distress.  Presents in a wheelchair. Head:  Normocephalic and atraumatic. Eyes:  Sclera clear, no icterus.Conjunctiva pink. Ears:  Normal auditory acuity. Nose:  No deformity, discharge or lesions. Mouth:  No deformity or lesions. Oropharynx pink & moist. Neck:  Supple; no masses or thyromegaly. Lungs:  Clear throughout to auscultation. No wheezes, crackles, or rhonchi. No acute distress. Heart:  Regular rate and rhythm; no murmurs, clicks, rubs,  or gallops. Abdomen:  Soft, nontender and nondistended. No masses, hepatosplenomegaly or hernias noted. Normal bowel sounds, without guarding, and without rebound.   Rectal:  Deferred until time of colonoscopy.   Msk:  Symmetrical without gross deformities. Pulses:  Normal pulses noted. Extremities: Bilateral lower extremity edema, compression stockings in use. Neurologic:  Alert and oriented x4; moves upper extremities equally, bilateral lower extremities weak secondary to MS. Skin: Intact without significant lesions or rashes. Psych:  Alert and cooperative. Normal mood and affect.  Intake/Output from previous day: No intake/output data recorded. Intake/Output this shift: No intake/output data recorded.  Lab Results: No results for input(s): WBC, HGB, HCT, PLT in the last 72 hours. BMET No results for input(s): NA, K, CL, CO2, GLUCOSE, BUN, CREATININE, CALCIUM  in the last 72 hours. LFT No results for input(s): PROT, ALBUMIN, AST, ALT, ALKPHOS, BILITOT, BILIDIR, IBILI in the last 72 hours. PT/INR No results for input(s): LABPROT, INR in the last 72 hours. Hepatitis Panel No results for input(s): HEPBSAG, HCVAB, HEPAIGM, HEPBIGM in the last 72 hours.  Studies/Results: No results found.  Impression / Plan:  53 year old male with a positive Cologuard test 12/2023 admitted today to assist with bowel prep for a colonoscopy  08/04/2024 with Dr. Legrand. Patient has progressive multiple sclerosis requiring extensive assistance with bowel prep. - Clear liquid  diet  - NPO 0500 8/28 - Colonoscopy benefits and risks discussed including risk with sedation, risk of bleeding, perforation and infection  - Bowel prep to include a Fleet enema at 1500 x 1 followed by a tapwater enema 30 minutes later. - Start GoLytely, 3 L of GoLytely over the course of the evening (5pm - 10PM) then 1L  starting 4 AM on 8/28. - I discussed the above bowel prep instructions with the patient's RN - CBC, CMP  Iron deficiency anemia - EGD and colonoscopy as ordered above  - CBC, iron, TIBC and ferritin level  GERD, chronic symptoms despite taking PPI po bid and Carafate  or calcium  carbonate as needed - EGD as ordered above - Pantoprazole  40 mg po twice daily  Coronary artery disease s/p NSTEMI s/p stent placement to the LAD and PCI to the RCA.  On ASA 81 mg daily.  Off ASA x 1 week because he ran out of his supply.  No angina.    LOS: 0 days   Elida CHRISTELLA Shawl  08/03/2024, 1:50 PM  I have taken an interval history, thoroughly reviewed the chart and examined the patient. I agree with the Advanced Practitioner's note, impression and recommendations, and have recorded additional findings, impressions and recommendations below. I performed a substantive portion of this encounter (>50% time spent), including a complete performance of the medical decision making.  My additional thoughts are as follows:  53 year old man known to me from the office setting here for planned admission for bowel preparation to investigate positive Cologuard test with a colonoscopy tomorrow.  He has multiple sclerosis with impaired mobility and thus is unable to complete a bowel preparation at home. We also recently noticed modest decline in his hemoglobin with a low ferritin, and therefore an upper endoscopy was also recommended to him today to evaluate that  along with his reflux symptoms. He was agreeable to both procedures after discussion of them and risks.  The benefits and risks of the planned procedure(s) were described in detail with the patient or (when appropriate) their health care proxy.  Risks were outlined as including, but not limited to, bleeding, infection, perforation, adverse medication reaction leading to cardiac or pulmonary decompensation, pancreatitis (if ERCP).  The limitation of incomplete mucosal visualization was also discussed.  No guarantees or warranties were given.  Patient at increased risk for cardiopulmonary complications of procedure due to medical comorbidities.  _________________  This admission required a moderate degree of medical decision making due to the nature and complexity of the acute condition(s) being evaluated as well as the patient's medical comorbidities.  If we are able to achieve a sufficient quality bowel preparation for his colonoscopy tomorrow, then this is expected to be an overnight admission with a discharge home later in the day after his procedures tomorrow.  If sufficient quality preparation cannot be achieved in time for his procedures scheduled tomorrow morning, then he may require a second night of hospitalization for further preparation and a procedure the following day.  Victory LITTIE Legrand III Office:[phone]

## 2024-08-03 NOTE — Plan of Care (Signed)

## 2024-08-04 ENCOUNTER — Encounter (HOSPITAL_COMMUNITY): Payer: Self-pay | Admitting: Gastroenterology

## 2024-08-04 ENCOUNTER — Inpatient Hospital Stay (HOSPITAL_COMMUNITY): Admitting: Anesthesiology

## 2024-08-04 ENCOUNTER — Encounter (HOSPITAL_COMMUNITY)
Admission: RE | Disposition: A | Discharge status: Home / Self Care | Payer: Self-pay | Source: Home / Self Care | Attending: Gastroenterology

## 2024-08-04 DIAGNOSIS — Q438 Other specified congenital malformations of intestine: Secondary | ICD-10-CM

## 2024-08-04 DIAGNOSIS — I2510 Atherosclerotic heart disease of native coronary artery without angina pectoris: Secondary | ICD-10-CM | POA: Diagnosis not present

## 2024-08-04 DIAGNOSIS — D508 Other iron deficiency anemias: Secondary | ICD-10-CM

## 2024-08-04 DIAGNOSIS — K648 Other hemorrhoids: Secondary | ICD-10-CM | POA: Diagnosis not present

## 2024-08-04 DIAGNOSIS — D509 Iron deficiency anemia, unspecified: Secondary | ICD-10-CM | POA: Diagnosis not present

## 2024-08-04 DIAGNOSIS — K3189 Other diseases of stomach and duodenum: Secondary | ICD-10-CM

## 2024-08-04 DIAGNOSIS — K635 Polyp of colon: Secondary | ICD-10-CM

## 2024-08-04 DIAGNOSIS — I10 Essential (primary) hypertension: Secondary | ICD-10-CM | POA: Diagnosis not present

## 2024-08-04 DIAGNOSIS — R195 Other fecal abnormalities: Secondary | ICD-10-CM | POA: Diagnosis not present

## 2024-08-04 DIAGNOSIS — D122 Benign neoplasm of ascending colon: Principal | ICD-10-CM

## 2024-08-04 DIAGNOSIS — K449 Diaphragmatic hernia without obstruction or gangrene: Secondary | ICD-10-CM | POA: Diagnosis not present

## 2024-08-04 HISTORY — PX: COLONOSCOPY: SHX5424

## 2024-08-04 HISTORY — PX: ESOPHAGOGASTRODUODENOSCOPY: SHX5428

## 2024-08-04 LAB — BASIC METABOLIC PANEL WITH GFR
Anion gap: 10 (ref 5–15)
BUN: 14 mg/dL (ref 6–20)
CO2: 28 mmol/L (ref 22–32)
Calcium: 8.8 mg/dL — ABNORMAL LOW (ref 8.9–10.3)
Chloride: 104 mmol/L (ref 98–111)
Creatinine, Ser: 0.92 mg/dL (ref 0.61–1.24)
GFR, Estimated: >60 mL/min (ref >60)
Glucose, Bld: 95 mg/dL (ref 70–99)
Potassium: 4.2 mmol/L (ref 3.5–5.1)
Sodium: 142 mmol/L (ref 135–145)

## 2024-08-04 LAB — FERRITIN: Ferritin: 26 ng/mL (ref 24–336)

## 2024-08-04 SURGERY — COLONOSCOPY
Anesthesia: Monitor Anesthesia Care

## 2024-08-04 MED ORDER — PHENYLEPHRINE HCL (PRESSORS) 10 MG/ML IV SOLN
INTRAVENOUS | Status: AC
Start: 2024-08-04 — End: 2024-08-04
  Filled 2024-08-04: qty 1

## 2024-08-04 MED ORDER — EPHEDRINE SULFATE-NACL 50-0.9 MG/10ML-% IV SOSY
PREFILLED_SYRINGE | INTRAVENOUS | Status: DC | PRN
  Administered 2024-08-04: 5 mg via INTRAVENOUS
  Administered 2024-08-04 (×2): 10 mg via INTRAVENOUS

## 2024-08-04 MED ORDER — LACTATED RINGERS IV SOLN: INTRAVENOUS | Status: DC | PRN

## 2024-08-04 MED ORDER — GLYCOPYRROLATE PF 0.2 MG/ML IJ SOSY
PREFILLED_SYRINGE | INTRAMUSCULAR | Status: DC | PRN
  Administered 2024-08-04: .2 mg via INTRAVENOUS

## 2024-08-04 MED ORDER — PHENYLEPHRINE HCL-NACL 20-0.9 MG/250ML-% IV SOLN
INTRAVENOUS | Status: DC | PRN
  Administered 2024-08-04: 60 ug/min via INTRAVENOUS

## 2024-08-04 MED ORDER — PROPOFOL 500 MG/50ML IV EMUL
INTRAVENOUS | Status: DC | PRN
  Administered 2024-08-04: 240 ug/kg/min via INTRAVENOUS
  Administered 2024-08-04: 30 mg via INTRAVENOUS

## 2024-08-04 MED ORDER — PHENYLEPHRINE 80 MCG/ML (10ML) SYRINGE FOR IV PUSH (FOR BLOOD PRESSURE SUPPORT)
PREFILLED_SYRINGE | INTRAVENOUS | Status: DC | PRN
  Administered 2024-08-04: 160 ug via INTRAVENOUS
  Administered 2024-08-04: 80 ug via INTRAVENOUS
  Administered 2024-08-04: 160 ug via INTRAVENOUS
  Administered 2024-08-04: 240 ug via INTRAVENOUS
  Administered 2024-08-04 (×2): 160 ug via INTRAVENOUS
  Administered 2024-08-04: 240 ug via INTRAVENOUS

## 2024-08-04 MED ORDER — PHENYLEPHRINE HCL (PRESSORS) 10 MG/ML IV SOLN
INTRAVENOUS | Status: AC
  Filled 2024-08-04: qty 1

## 2024-08-04 NOTE — Op Note (Signed)
 [facility] Patient Name: Keith Morrow Procedure Date: 08/04/2024 MRN: [id number] Attending MD: Victory CROME. Legrand , MD, [id number] Date of Birth: July 27, 1971 CSN: [id number] Age: 53 Admit Type: Outpatient Procedure:                Colonoscopy Indications:              Unexplained iron deficiency anemia, Positive                            Cologuard test Providers:                Victory L. Legrand, MD, Willy Hummer, RN, Felice Sar,                            Technician Referring MD:             Harlene FELIX Domenica Medicines:                Monitored Anesthesia Care Complications:            No immediate complications. Estimated Blood Loss:     Estimated blood loss was minimal. Procedure:                Pre-Anesthesia Assessment:                           - Prior to the procedure, a History and Physical                            was performed, and patient medications and                            allergies were reviewed. The patient's tolerance of                            previous anesthesia was also reviewed. The risks                            and benefits of the procedure and the sedation                            options and risks were discussed with the patient.                            All questions were answered, and informed consent                            was obtained. Prior Anticoagulants: The patient has                            taken no anticoagulant or antiplatelet agents. ASA                            Grade Assessment: III - A patient with severe  systemic disease. After reviewing the risks and                            benefits, the patient was deemed in satisfactory                            condition to undergo the procedure.                           After obtaining informed consent, the colonoscope                            was passed under direct vision. Throughout the                            procedure, the  patient's blood pressure, pulse, and                            oxygen saturations were monitored continuously. The                            CF-HQ190L ([id number]) Olympus colonoscope was                            introduced through the anus and advanced to the the                            cecum, identified by appendiceal orifice and                            ileocecal valve. The colonoscopy was performed with                            difficulty due to a redundant colon. Successful                            completion of the procedure was aided by using                            manual pressure and straightening and shortening                            the scope to obtain bowel loop reduction. The                            patient tolerated the procedure well. The quality                            of the bowel preparation was good after extensive                            lavage of opaque liquid. The ileocecal valve,  appendiceal orifice, and rectum were photographed.                            The bowel preparation used was Fleets enema, tap                            water enema, then GoLYTELY. (Patient admitted to                            hospital the day prior for prep due to MS mobility                            limitations) Scope In: 9:37:57 AM Scope Out: 10:16:55 AM Scope Withdrawal Time: 0 hours 27 minutes 49 seconds  Total Procedure Duration: 0 hours 38 minutes 58 seconds  Findings:      The perianal and digital rectal examinations were normal.      A diminutive polyp was found in the ascending colon. The polyp was       sessile. The polyp was removed with a cold snare. Resection and       retrieval were complete.      Internal hemorrhoids were found. The hemorrhoids were small.      The exam was otherwise without abnormality on direct and retroflexion       views. Impression:               - One diminutive polyp in the ascending colon,                             removed with a cold snare. Resected and retrieved.                           - Internal hemorrhoids.                           - The examination was otherwise normal on direct                            and retroflexion views.                           If duodenal Bx neg for celiac, then no source of                            the mild IDA seen on either exam today. Moderate Sedation:      MAC sedation used Recommendation:           - Patient has a contact number available for                            emergencies. The signs and symptoms of potential                            delayed complications were discussed with the  patient. Return to normal activities tomorrow.                            Written discharge instructions were provided to the                            patient.                           - Resume previous diet.                           - Continue present medications.                           - Await pathology results.                           - Repeat colonoscopy is recommended for                            surveillance. The colonoscopy date will be                            determined after pathology results from today's                            exam become available for review.                           - Discharge home today Procedure Code(s):        --- Professional ---                           [phone], Colonoscopy, flexible; with removal of                            tumor(s), polyp(s), or other lesion(s) by snare                            technique Diagnosis Code(s):        --- Professional ---                           D12.2, Benign neoplasm of ascending colon                           K64.8, Other hemorrhoids                           D50.9, Iron deficiency anemia, unspecified                           R19.5, Other fecal abnormalities CPT copyright 2022 American Medical Association. All rights  reserved. The codes documented in this report are preliminary and upon coder review may  be revised to meet current compliance requirements. [name], MD 08/04/2024 10:31:25 AM This report has been  signed electronically. Number of Addenda: 0

## 2024-08-04 NOTE — Interval H&P Note (Signed)
 History and Physical Interval Note:  08/04/2024 9:05 AM  Keith Morrow  has presented today for surgery, with the diagnosis of Positive cologaurd test.  The various methods of treatment have been discussed with the patient and family. After consideration of risks, benefits and other options for treatment, the patient has consented to  Procedure(s): COLONOSCOPY (N/A) EGD (ESOPHAGOGASTRODUODENOSCOPY) (N/A) as a surgical intervention.  The patient's history has been reviewed, patient examined, no change in status, stable for surgery.  I have reviewed the patient's chart and labs.  Questions were answered to the patient's satisfaction.     [name]

## 2024-08-04 NOTE — Transfer of Care (Signed)
 Immediate Anesthesia Transfer of Care Note  Patient: Keith Morrow  Procedure(s) Performed: COLONOSCOPY EGD (ESOPHAGOGASTRODUODENOSCOPY)  Patient Location: PACU Endo  Anesthesia Type:MAC  Level of Consciousness: drowsy and responds to stimulation  Airway & Oxygen Therapy: Patient Spontanous Breathing and Patient connected to face mask oxygen  Post-op Assessment: Report given to RN and Post -op Vital signs reviewed and stable  Post vital signs: Reviewed and stable  Last Vitals:  Vitals Value Taken Time  BP 111/37 08/04/24 10:25  Temp    Pulse 52 08/04/24 10:26  Resp 26 08/04/24 10:27  SpO2 98 % 08/04/24 10:26  Vitals shown include unfiled device data.  Last Pain:  Vitals:   08/04/24 0811  TempSrc: Temporal  PainSc: 0-No pain         Complications: No notable events documented.

## 2024-08-04 NOTE — Discharge Summary (Addendum)
 Patient: Keith Morrow. MRN [id number]. DOB Dec 09, 1970 Admit date: 08/03/2024 Discharge date: 08/04/2024 Discharging physician: Dr. Legrand  Discharge Summary:   HPI: Keith Morrow is a 53 y.o. male medical history of anxiety, depression, hypertension, hyperlipidemia, coronary artery disease s/p NSTEMI s/p stent placement to the LAD and PCI to the RCA, hypothyroidism, IDA, obesity, sleep apnea and multiple sclerosis. He underwent a Cologuard test which was positive 12/09/2023. Due to his progressive MS, he was admitted to the hospital 08/03/2024 to assist with the colonoscopy bowel prep for colonoscopy scheduled 08/04/2024. Recent laboratory studies 06/06/2024 showed mild IDA with low ferritin levels for the past year therefore an EGD to be completed at the time of his colonoscopy.  Labs today: Na+ 142. K+ 4.2. BUN 14. Cr 0.92.    Labs 08/03/2024: WBC 7.6. Hg 12.1. HCT 38.5. PLT 181. Iron 69. Ferritin 26.  EGD 08/04/2024:   - 1 cm hiatal hernia.  - A single mucosal papule (nodule) found in the stomach. Biopsied.  - Normal mucosa was found in the entire examined duodenum. Biopsied.  Colonoscopy 08/04/2024: - One diminutive polyp in the ascending colon, removed with a cold snare. Resected and retrieved.  - Internal hemorrhoids.  - The examination was otherwise normal on direct and retroflexion views. If duodenal Bx neg for celiac, then no source of the mild IDA seen on either exam today.  Subjective: He denies having any CP or SOB. No abdominal pain. Post EGD/colonoscopy, patient stated he hasn't urinated since admission. Upon return to the medical floor, patient successfully voided 700cc as confirmed per his RN therefore bladder scan not warranted.   Objective:  Physical exam:  BP 111/61 (BP Location: Right Arm)   Pulse 63   Temp 97.6 F (36.4 C) (Oral)   Resp 18   SpO2 99%   General: Alert and oriented 53 year old male in no acute distress. Head:  Normocephalic and atraumatic. Eyes:  Sclera  clear, no icterus.Conjunctiva pink. Ears:  Normal auditory acuity. Mouth:  No deformity or lesions. Oropharynx pink & moist. Neck:  Supple; no masses or thyromegaly. Lungs:  Clear throughout to auscultation. No wheezes, crackles, or rhonchi. No acute distress. Heart:  Regular rate and rhythm; no murmurs, clicks, rubs,  or gallops. Abdomen:  Soft, nontender and nondistended. No masses, hepatosplenomegaly or hernias noted. Normal bowel sounds, without guarding, and without rebound.   Pulses:  Normal pulses noted. Extremities: Bilateral lower extremity edema, compression stockings in use. Neurologic:  Alert and oriented x4; moves upper extremities equally, bilateral lower extremities weak secondary to MS.  Assessment and Plan:  Positive Cologuard Test.  Colonoscopy today identified and internal hemorrhoids. - Our office will contact patient with path results and timing of further colonoscopies to be determined  GERD.  EGD today showed a 1 cm hiatal hernia and a single mucosal papule in the stomach which was biopsied.  The duodenum appeared normal and biopsies were obtained to rule out celiac disease. -Continue Pantoprazole  40mg  once capsule twice daily to be taken 30 minutes before breakfast and  dinner  IDA.  Hemoglobin 12.1.  Iron 69.  TIBC 335. EGD and colonoscopy initial findings do not explain etiology for IDA. Duodenal  biopsies obtained to rule out celiac disease.  - Our office will  contact patient with path results   Urinary retention, patient did not void overnight or earlier this morning. Post EGD/colonoscopy patient voided 700 cc of urine, bladder  scan/in and out cath not warranted.   Multiple Sclerosis

## 2024-08-04 NOTE — Anesthesia Procedure Notes (Signed)
 Procedure Name: MAC Date/Time: 08/04/2024 9:05 AM  Performed by: Franchot Delon RAMAN, CRNAPre-anesthesia Checklist: Patient identified, Emergency Drugs available, Suction available and Patient being monitored Oxygen Delivery Method: Simple face mask Airway Equipment and Method: Bite block Placement Confirmation: positive ETCO2 Dental Injury: Teeth and Oropharynx as per pre-operative assessment

## 2024-08-04 NOTE — Anesthesia Preprocedure Evaluation (Addendum)
 Anesthesia Evaluation  Patient identified by MRN, date of birth, ID band Patient awake    Reviewed: Allergy & Precautions, NPO status , Patient's Chart, lab work & pertinent test results  Airway Mallampati: II       Dental no notable dental hx.    Pulmonary sleep apnea    Pulmonary exam normal        Cardiovascular hypertension, Pt. on medications + CAD, + Past MI and + Cardiac Stents  Normal cardiovascular exam     Neuro/Psych  PSYCHIATRIC DISORDERS Anxiety Depression    MS (multiple sclerosis)  Neuromuscular disease    GI/Hepatic Neg liver ROS,GERD  Medicated and Controlled,,  Endo/Other  Hypothyroidism  Class 3 obesity  Renal/GU negative Renal ROS     Musculoskeletal Chronic pain   Abdominal  (+) + obese  Peds  Hematology  (+) Blood dyscrasia, anemia   Anesthesia Other Findings Positive cologaurd test  Reproductive/Obstetrics                              Anesthesia Physical Anesthesia Plan  ASA: 3  Anesthesia Plan: MAC   Post-op Pain Management:    Induction:   PONV Risk Score and Plan: 1 and Propofol  infusion and Treatment may vary due to age or medical condition  Airway Management Planned: Nasal Cannula  Additional Equipment:   Intra-op Plan:   Post-operative Plan:   Informed Consent: I have reviewed the patients History and Physical, chart, labs and discussed the procedure including the risks, benefits and alternatives for the proposed anesthesia with the patient or authorized representative who has indicated his/her understanding and acceptance.     Dental advisory given  Plan Discussed with: CRNA  Anesthesia Plan Comments:          Anesthesia Quick Evaluation

## 2024-08-04 NOTE — Op Note (Signed)
 [facility] Patient Name: Keith Morrow: 08/04/2024 MRN: [id number] Attending MD: Victory CROME. Legrand , MD, [id number] Morrow of Birth: 03/17/1971 CSN: [id number] Age: 53 Admit Type: Outpatient Procedure:                Upper GI endoscopy Indications:              Unexplained iron deficiency anemia Providers:                Victory L. Legrand, MD, Willy Hummer, RN, Felice Sar,                            Technician Referring MD:             Harlene FELIX Domenica Medicines:                Monitored Anesthesia Care Complications:            No immediate complications. Estimated Blood Loss:     Estimated blood loss was minimal. Procedure:                Pre-Anesthesia Assessment:                           - Prior to the procedure, a History and Physical                            was performed, and patient medications and                            allergies were reviewed. The patient's tolerance of                            previous anesthesia was also reviewed. The risks                            and benefits of the procedure and the sedation                            options and risks were discussed with the patient.                            All questions were answered, and informed consent                            was obtained. Prior Anticoagulants: The patient has                            taken no anticoagulant or antiplatelet agents. ASA                            Grade Assessment: III - A patient with severe                            systemic disease. After reviewing the risks and  benefits, the patient was deemed in satisfactory                            condition to undergo the procedure.                           After obtaining informed consent, the endoscope was                            passed under direct vision. Throughout the                            procedure, the patient's blood pressure, pulse, and                             oxygen saturations were monitored continuously. The                            [id number]-H190 ([id number]) Olympus endoscope was introduced                            through the mouth, and advanced to the second part                            of duodenum. The upper GI endoscopy was                            accomplished without difficulty. The patient                            tolerated the procedure well. Scope In: Scope Out: Findings:      A 1 cm hiatal hernia was present.      A single diminutive mucosal papule (nodule) was found in the prepyloric       region of the stomach. Biopsies were taken with a cold forceps for       histology.      The exam of the stomach was otherwise normal.      The cardia and gastric fundus were normal on retroflexion.      Normal mucosa was found in the entire duodenum. Biopsies for histology       were taken with a cold forceps for evaluation of celiac disease. Impression:               - 1 cm hiatal hernia.                           - A single mucosal papule (nodule) found in the                            stomach. Biopsied.                           - Normal mucosa was found in the entire examined  duodenum. Biopsied. Moderate Sedation:      MAC sedation used Recommendation:           - Resume previous diet.                           - Return patient to hospital ward for possible                            discharge same day.                           - Await pathology results.                           - See the other procedure note for documentation of                            additional recommendations. Procedure Code(s):        --- Professional ---                           [phone], Esophagogastroduodenoscopy, flexible,                            transoral; with biopsy, single or multiple Diagnosis Code(s):        --- Professional ---                           K31.89, Other diseases of stomach and duodenum                            D50.9, Iron deficiency anemia, unspecified CPT copyright 2022 American Medical Association. All rights reserved. The codes documented in this report are preliminary and upon coder review may  be revised to meet current compliance requirements. Haim Hansson L. Legrand, MD 08/04/2024 10:24:14 AM This report has been signed electronically. Number of Addenda: 0

## 2024-08-04 NOTE — Plan of Care (Signed)

## 2024-08-04 NOTE — Progress Notes (Signed)
Pt down for procedure at this time.

## 2024-08-04 NOTE — Progress Notes (Signed)
 Brief GI Progress Note: Received a fleet enema and water enema yesterday. Completed 4L Nulytely bowel prep as prescribed and is passing clear water per the rectum this morning confirmed by his RN. He endorses having mild lower abdominal cramping after passing numerous BMs overnight. No significant abdominal pain. No N/V. No CP or SOB. Remains NPO since 5AM.  Hemodynamically stable.  Labs today: Na+ 142. K+ 4.2. BUN 14. Cr 0.92. Labs 08/03/2024: WBC 7.6. Hg 12.1. HCT 38.5. PLT 181. Iron 69. Ferritin 26.  His is alert and oriented in NAD. Heart rhythm is normal, no murmurs.  Breath  sounds clear throughout. Abdomen soft, nondistended. Very mild tenderness to the lower abdomen without rebound or guarding.   Patient to proceed with EGD and colonoscopy today as planned.

## 2024-08-04 NOTE — Discharge Instructions (Signed)

## 2024-08-05 ENCOUNTER — Encounter (HOSPITAL_COMMUNITY): Payer: Self-pay | Admitting: Gastroenterology

## 2024-08-05 LAB — SURGICAL PATHOLOGY

## 2024-08-05 NOTE — Anesthesia Postprocedure Evaluation (Signed)
 Anesthesia Post Note  Patient: Keith Morrow  Procedure(s) Performed: COLONOSCOPY EGD (ESOPHAGOGASTRODUODENOSCOPY)     Patient location during evaluation: Endoscopy Anesthesia Type: MAC Level of consciousness: awake Pain management: pain level controlled Vital Signs Assessment: post-procedure vital signs reviewed and stable Respiratory status: spontaneous breathing, nonlabored ventilation and respiratory function stable Cardiovascular status: blood pressure returned to baseline and stable Postop Assessment: no apparent nausea or vomiting Anesthetic complications: no   No notable events documented.  Last Vitals:  Vitals:   08/04/24 1050 08/04/24 1158  BP: (!) 122/53 111/61  Pulse: 71 63  Resp: 18 18  Temp:  36.4 C  SpO2: 99% 99%    Last Pain:  Vitals:   08/04/24 1158  TempSrc: Oral  PainSc:                  [name] P Darick Fetters

## 2024-08-07 ENCOUNTER — Encounter: Payer: Self-pay | Admitting: Neurology

## 2024-08-09 MED ORDER — METHYLPHENIDATE HCL 10 MG PO TABS: 10.0000 mg | ORAL_TABLET | Freq: Four times a day (QID) | ORAL | 0 refills | Status: DC

## 2024-08-09 NOTE — Telephone Encounter (Signed)
 Last seen on 03/29/24 Follow up scheduled on 11/02/24    Start Date End Date Dispense Refills Pharmacy   methylphenidate  (RITALIN ) 10 MG tablet 03/29/2024 -- 120 tablet 0 [id number] [place] ...   Take 1 tablet (10 mg total) by mouth 4 (four) times daily.    Patient taking differently: Take 10-20 mg by mouth See admin instructions. Take 20 mg by mouth in the morning and an additional 10 mg two times a day    Reason: Patient Preference    Reported on: 08/03/2024    Rx pending to be signed

## 2024-08-11 ENCOUNTER — Other Ambulatory Visit: Payer: Self-pay

## 2024-08-11 DIAGNOSIS — F418 Other specified anxiety disorders: Secondary | ICD-10-CM

## 2024-08-11 MED ORDER — DULOXETINE HCL 60 MG PO CPEP: 60.0000 mg | ORAL_CAPSULE | Freq: Two times a day (BID) | ORAL | 0 refills | Status: DC

## 2024-08-11 NOTE — Telephone Encounter (Signed)
 Spoke with pharmacist w/ [facility] and he advised that brand name Cymbalta  60 MG is no longer available and generic name Duloxetine  will have to be send into pharmacy.   Reached out to patient to advised and he would like a 90 day supply [facility].

## 2024-08-16 ENCOUNTER — Encounter: Payer: Self-pay | Admitting: Family Medicine

## 2024-08-16 ENCOUNTER — Telehealth: Payer: Self-pay | Admitting: Family Medicine

## 2024-08-16 ENCOUNTER — Ambulatory Visit: Payer: Self-pay | Admitting: Gastroenterology

## 2024-08-16 DIAGNOSIS — F418 Other specified anxiety disorders: Secondary | ICD-10-CM

## 2024-08-16 MED ORDER — DULOXETINE HCL 60 MG PO CPEP: 60.0000 mg | ORAL_CAPSULE | Freq: Two times a day (BID) | ORAL | 0 refills | Status: DC

## 2024-08-16 NOTE — Telephone Encounter (Signed)
 Letter mailed. Recall placed.

## 2024-08-16 NOTE — Telephone Encounter (Signed)
 Rx resent.

## 2024-08-16 NOTE — Telephone Encounter (Signed)
 Copied from CRM #[id number]. Topic: Clinical - Prescription Issue >> Aug 16, 2024 10:21 AM Aleatha BROCKS wrote: Reason for CRM: Richard from [facility] wants to request a new prescription code for the DULoxetine  (CYMBALTA ) 60 MG capsule and It should be Dual 1, no substitution allowed or no generic version

## 2024-08-18 ENCOUNTER — Other Ambulatory Visit: Payer: Self-pay | Admitting: *Deleted

## 2024-08-18 DIAGNOSIS — G35 Multiple sclerosis: Secondary | ICD-10-CM

## 2024-08-18 DIAGNOSIS — R269 Unspecified abnormalities of gait and mobility: Secondary | ICD-10-CM

## 2024-08-18 MED ORDER — DALFAMPRIDINE ER 10 MG PO TB12: 10.0000 mg | ORAL_TABLET | Freq: Two times a day (BID) | ORAL | 1 refills | Status: AC

## 2024-08-18 MED ORDER — CYMBALTA 60 MG PO CPEP: 60.0000 mg | ORAL_CAPSULE | Freq: Two times a day (BID) | ORAL | 0 refills | Status: DC

## 2024-08-26 ENCOUNTER — Ambulatory Visit: Admitting: Psychology

## 2024-08-26 DIAGNOSIS — F331 Major depressive disorder, recurrent, moderate: Secondary | ICD-10-CM

## 2024-08-26 NOTE — Progress Notes (Signed)
 [facility] Counselor/Therapist Progress Note  Patient ID: Keith Morrow, MRN: [id number],    Date: 08/26/2024  Time Spent: 10:00am-10:45am  45 minutes   Treatment Type: Individual Therapy  Reported Symptoms: stress, sadness  Mental Status Exam: Appearance:  Casual     Behavior: Appropriate  Motor: Normal  Speech/Language:  Normal Rate  Affect: Appropriate  Mood: normal  Thought process: normal  Thought content:   WNL  Sensory/Perceptual disturbances:   WNL  Orientation: oriented to person, place, time/date, and situation  Attention: Good  Concentration: Good  Memory: WNL  Fund of knowledge:  Good  Insight:   Good  Judgment:  Good  Impulse Control: Good   Risk Assessment: Danger to Self:  No Self-injurious Behavior: No Danger to Others: No Duty to Warn:no Physical Aggression / Violence:No  Access to Firearms a concern: No  Gang Involvement:No   Subjective: Pt present for face-to-face individual therapy via video.  Pt consents to telehealth video session and is aware of limitations and benefits of virtual sessions.  Location of pt: home Location of therapist: home office.   Pt talked about work.  He continues to have to work long hours bc of big projects they are working on.    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   Pt is feeling more tired and stressed with having to go into the office.  Pt gets more exhausted.  Worked on Optician, dispensing.  Addressed how he can take care of himself .  Pt leaves this evening for his work trip to [place].  Jenkins will accompany him.  Pt is worried about the long road trip. Pt talked about his health.  He states the strength in his legs is getting worse.   It is harder for him to transfer from the toilet to the wheelchair.   Addressed pt's concerns.   Pt states he had a hard time emotionally a couple of weeks ago bc of  not being able to do things physically.  Helped pt process his feelings.  Pt is grieving the loss of functioning and the loss of independence.  Helped pt process these issues.  Provided supportive therapy.    Interventions: Cognitive Behavioral Therapy and Insight-Oriented  Diagnosis:  F33.1  Plan of Care: Recommend ongoing therapy.  Pt participated in setting treatment goals.  Plan to meet monthly.   Pt agrees with treatment plan.    Treatment Plan  (Treatment Plan Target Date: 04/29/2025) Client Abilities/Strengths  Pt is bright, engaging, and motivated for therapy.   Client Treatment Preferences  Individual therapy.  Client Statement of Needs  Improve coping skills.  Symptoms  Depressed or irritable mood. Feelings of hopelessness, worthlessness, or inappropriate guilt. Low self-esteem.  Problems Addressed  Unipolar Depression Goals 1. Alleviate depressive symptoms and return to previous level of effective functioning. 2. Appropriately grieve the loss in order to normalize mood and to return to previously adaptive level of functioning. Objective Learn and implement behavioral strategies to overcome depression. Target Date: 2025-04-29 Frequency: Monthly  Progress: 49 Modality: individual  Related Interventions Engage the client in behavioral activation, increasing his/her activity level and contact with sources of reward, while identifying processes that inhibit activation.  Use behavioral techniques such as instruction, rehearsal, role-playing, role reversal, as needed, to facilitate activity in the client's daily life; reinforce success. Assist the client in developing skills that  increase the likelihood of deriving pleasure from behavioral activation (e.g., assertiveness skills, developing an exercise plan, less internal/more external focus, increased social involvement); reinforce success. Objective Identify important people in life, past and present, and describe the quality,  good and poor, of those relationships. Target Date: 2025-04-29 Frequency: Monthly  Progress: 65 Modality: individual  Related Interventions Conduct Interpersonal Therapy beginning with the assessment of the client's interpersonal inventory of important past and present relationships; develop a case formulation linking depression to grief, interpersonal role disputes, role transitions, and/or interpersonal deficits). Objective Learn and implement problem-solving and decision-making skills. Target Date: 2025-04-29 Frequency: Monthly  Progress: 65 Modality: individual  Related Interventions Conduct Problem-Solving Therapy using techniques such as psychoeducation, modeling, and role-playing to teach client problem-solving skills (i.e., defining a problem specifically, generating possible solutions, evaluating the pros and cons of each solution, selecting and implementing a plan of action, evaluating the efficacy of the plan, accepting or revising the plan); role-play application of the problem-solving skill to a real life issue. Encourage in the client the development of a positive problem orientation in which problems and solving them are viewed as a natural part of life and not something to be feared, despaired, or avoided. 3. Develop healthy interpersonal relationships that lead to the alleviation and help prevent the relapse of depression. 4. Develop healthy thinking patterns and beliefs about self, others, and the world that lead to the alleviation and help prevent the relapse of depression. 5. Recognize, accept, and cope with feelings of depression. Diagnosis F33.1  Conditions For Discharge Achievement of treatment goals and objectives   Veva Alma, LCSW

## 2024-09-06 ENCOUNTER — Telehealth: Payer: Self-pay | Admitting: *Deleted

## 2024-09-06 NOTE — Telephone Encounter (Signed)
 Faxed back POC signed by Dr. Vear to [facility] at [phone]. Received fax confirmation.

## 2024-09-13 ENCOUNTER — Telehealth: Payer: Self-pay | Admitting: *Deleted

## 2024-09-13 NOTE — Telephone Encounter (Signed)
 Faxed completed/signed form below to [facility], received fax confirmation.

## 2024-09-19 ENCOUNTER — Telehealth: Payer: Self-pay | Admitting: *Deleted

## 2024-09-19 NOTE — Telephone Encounter (Signed)
 Faxed to [phone], confirmation received

## 2024-09-23 ENCOUNTER — Ambulatory Visit: Admitting: Psychology

## 2024-09-23 DIAGNOSIS — F331 Major depressive disorder, recurrent, moderate: Secondary | ICD-10-CM

## 2024-09-23 NOTE — Progress Notes (Signed)
 [facility] Counselor/Therapist Progress Note  Patient ID: Keith Morrow, MRN: [id number],    Date: 09/23/2024  Time Spent: 2:00pm-2:55pm  55 minutes   Treatment Type: Individual Therapy  Reported Symptoms: stress, sadness  Mental Status Exam: Appearance:  Casual     Behavior: Appropriate  Motor: Normal  Speech/Language:  Normal Rate  Affect: Appropriate  Mood: normal  Thought process: normal  Thought content:   WNL  Sensory/Perceptual disturbances:   WNL  Orientation: oriented to person, place, time/date, and situation  Attention: Good  Concentration: Good  Memory: WNL  Fund of knowledge:  Good  Insight:   Good  Judgment:  Good  Impulse Control: Good   Risk Assessment: Danger to Self:  No Self-injurious Behavior: No Danger to Others: No Duty to Warn:no Physical Aggression / Violence:No  Access to Firearms a concern: No  Gang Involvement:No   Subjective: Pt present for face-to-face individual therapy via video.  Pt consents to telehealth video session and is aware of limitations and benefits of virtual sessions.  Location of pt: home Location of therapist: home office.   Pt talked about having a busy day of meetings at work today.  Pt had a trip to [place] for work and it went well.  However they stopped and visited [place] and some of the interactions were weird.  [name] told Debby he may have aspergers and it offended Debby and he told them to leave.   This was upsetting to pt.  They don't know why Debby' reaction was so harsh.  Pt is feeling bitter about the things Debby said.  Pt's feelings are hurt.  Helped pt process his feelings and family dynamics.  Pt talked about the holidays.  He is hosting Thanksgiving at his house.  Pt is having to get some new furniture and when shopping the sales people did not understand pt's disability.  The sales person was patronizing to pt which was upsetting.  Addressed how this impacted pt.  Pt talked about his  health.  He states the strength in his legs is getting worse.   It is still harder for him to transfer from the toilet to the wheelchair.   Addressed pt's concerns.   Pt is grieving the loss of functioning and the loss of independence.  Helped pt process these issues.  Pt talked about his cat getting cancer.  They had to put the cat down last week.  Helped pt process his grief.  Provided supportive therapy.    Interventions: Cognitive Behavioral Therapy and Insight-Oriented  Diagnosis:  F33.1  Plan of Care: Recommend ongoing therapy.  Pt participated in setting treatment goals.  Plan to meet monthly.   Pt agrees with treatment plan.    Treatment Plan  (Treatment Plan Target Date: 04/29/2025) Client Abilities/Strengths  Pt is bright, engaging, and motivated for therapy.   Client Treatment Preferences  Individual therapy.  Client Statement of Needs  Improve coping skills.  Symptoms  Depressed or irritable mood. Feelings of hopelessness, worthlessness, or inappropriate guilt. Low self-esteem.  Problems Addressed  Unipolar Depression Goals 1. Alleviate depressive symptoms and return to previous level of effective functioning. 2. Appropriately grieve the loss in order to normalize mood and to return to previously adaptive level of functioning. Objective Learn and implement behavioral strategies to overcome depression. Target Date: 2025-04-29 Frequency: Monthly  Progress: 52 Modality: individual  Related Interventions Engage the client in behavioral activation, increasing his/her activity level and contact with sources of reward, while identifying processes that  inhibit activation.  Use behavioral techniques such as instruction, rehearsal, role-playing, role reversal, as needed, to facilitate activity in the client's daily life; reinforce success. Assist the client in developing skills that increase the likelihood of deriving pleasure from behavioral activation (e.g., assertiveness  skills, developing an exercise plan, less internal/more external focus, increased social involvement); reinforce success. Objective Identify important people in life, past and present, and describe the quality, good and poor, of those relationships. Target Date: 2025-04-29 Frequency: Monthly  Progress: 65 Modality: individual  Related Interventions Conduct Interpersonal Therapy beginning with the assessment of the client's interpersonal inventory of important past and present relationships; develop a case formulation linking depression to grief, interpersonal role disputes, role transitions, and/or interpersonal deficits). Objective Learn and implement problem-solving and decision-making skills. Target Date: 2025-04-29 Frequency: Monthly  Progress: 65 Modality: individual  Related Interventions Conduct Problem-Solving Therapy using techniques such as psychoeducation, modeling, and role-playing to teach client problem-solving skills (i.e., defining a problem specifically, generating possible solutions, evaluating the pros and cons of each solution, selecting and implementing a plan of action, evaluating the efficacy of the plan, accepting or revising the plan); role-play application of the problem-solving skill to a real life issue. Encourage in the client the development of a positive problem orientation in which problems and solving them are viewed as a natural part of life and not something to be feared, despaired, or avoided. 3. Develop healthy interpersonal relationships that lead to the alleviation and help prevent the relapse of depression. 4. Develop healthy thinking patterns and beliefs about self, others, and the world that lead to the alleviation and help prevent the relapse of depression. 5. Recognize, accept, and cope with feelings of depression. Diagnosis F33.1  Conditions For Discharge Achievement of treatment goals and objectives   Veva Alma, LCSW

## 2024-10-06 ENCOUNTER — Emergency Department (HOSPITAL_COMMUNITY)

## 2024-10-06 ENCOUNTER — Inpatient Hospital Stay (HOSPITAL_COMMUNITY)

## 2024-10-06 ENCOUNTER — Encounter: Payer: Self-pay | Admitting: Cardiovascular Disease

## 2024-10-06 ENCOUNTER — Encounter (HOSPITAL_COMMUNITY): Payer: Self-pay

## 2024-10-06 ENCOUNTER — Observation Stay (HOSPITAL_COMMUNITY)
Admission: EM | Admit: 2024-10-06 | Discharge: 2024-10-07 | Disposition: A | Discharge status: Home / Self Care | Attending: Internal Medicine | Admitting: Internal Medicine

## 2024-10-06 ENCOUNTER — Other Ambulatory Visit: Payer: Self-pay

## 2024-10-06 DIAGNOSIS — E039 Hypothyroidism, unspecified: Secondary | ICD-10-CM | POA: Diagnosis not present

## 2024-10-06 DIAGNOSIS — Z79899 Other long term (current) drug therapy: Secondary | ICD-10-CM | POA: Diagnosis not present

## 2024-10-06 DIAGNOSIS — F32A Depression, unspecified: Secondary | ICD-10-CM | POA: Insufficient documentation

## 2024-10-06 DIAGNOSIS — K5900 Constipation, unspecified: Secondary | ICD-10-CM | POA: Diagnosis not present

## 2024-10-06 DIAGNOSIS — K5909 Other constipation: Secondary | ICD-10-CM | POA: Diagnosis present

## 2024-10-06 DIAGNOSIS — Z6841 Body Mass Index (BMI) 40.0 and over, adult: Secondary | ICD-10-CM | POA: Diagnosis not present

## 2024-10-06 DIAGNOSIS — I4891 Unspecified atrial fibrillation: Secondary | ICD-10-CM | POA: Diagnosis not present

## 2024-10-06 DIAGNOSIS — R072 Precordial pain: Secondary | ICD-10-CM

## 2024-10-06 DIAGNOSIS — D649 Anemia, unspecified: Secondary | ICD-10-CM | POA: Insufficient documentation

## 2024-10-06 DIAGNOSIS — Z7982 Long term (current) use of aspirin: Secondary | ICD-10-CM | POA: Insufficient documentation

## 2024-10-06 DIAGNOSIS — K219 Gastro-esophageal reflux disease without esophagitis: Secondary | ICD-10-CM | POA: Insufficient documentation

## 2024-10-06 DIAGNOSIS — I25118 Atherosclerotic heart disease of native coronary artery with other forms of angina pectoris: Secondary | ICD-10-CM | POA: Diagnosis not present

## 2024-10-06 DIAGNOSIS — R079 Chest pain, unspecified: Principal | ICD-10-CM | POA: Insufficient documentation

## 2024-10-06 DIAGNOSIS — I480 Paroxysmal atrial fibrillation: Secondary | ICD-10-CM | POA: Insufficient documentation

## 2024-10-06 DIAGNOSIS — G35D Multiple sclerosis, unspecified: Secondary | ICD-10-CM | POA: Diagnosis not present

## 2024-10-06 DIAGNOSIS — E785 Hyperlipidemia, unspecified: Secondary | ICD-10-CM | POA: Diagnosis not present

## 2024-10-06 DIAGNOSIS — I10 Essential (primary) hypertension: Secondary | ICD-10-CM | POA: Diagnosis not present

## 2024-10-06 DIAGNOSIS — F418 Other specified anxiety disorders: Secondary | ICD-10-CM | POA: Diagnosis present

## 2024-10-06 DIAGNOSIS — I2510 Atherosclerotic heart disease of native coronary artery without angina pectoris: Secondary | ICD-10-CM | POA: Diagnosis not present

## 2024-10-06 LAB — TROPONIN I (HIGH SENSITIVITY)
Troponin I (High Sensitivity): 3 ng/L (ref <18)
Troponin I (High Sensitivity): 21 ng/L — ABNORMAL HIGH (ref <18)
Troponin I (High Sensitivity): 57 ng/L — ABNORMAL HIGH (ref <18)
Troponin I (High Sensitivity): 56 ng/L — ABNORMAL HIGH (ref <18)

## 2024-10-06 LAB — BASIC METABOLIC PANEL WITH GFR
Anion gap: 11 (ref 5–15)
BUN: 18 mg/dL (ref 6–20)
CO2: 25 mmol/L (ref 22–32)
Calcium: 8.7 mg/dL — ABNORMAL LOW (ref 8.9–10.3)
Chloride: 104 mmol/L (ref 98–111)
Creatinine, Ser: 0.92 mg/dL (ref 0.61–1.24)
GFR, Estimated: >60 mL/min (ref >60)
Glucose, Bld: 133 mg/dL — ABNORMAL HIGH (ref 70–99)
Potassium: 3.3 mmol/L — ABNORMAL LOW (ref 3.5–5.1)
Sodium: 140 mmol/L (ref 135–145)

## 2024-10-06 LAB — CBC
HCT: 38.6 % — ABNORMAL LOW (ref 39.0–52.0)
Hemoglobin: 12.5 g/dL — ABNORMAL LOW (ref 13.0–17.0)
MCH: 29.6 pg (ref 26.0–34.0)
MCHC: 32.4 g/dL (ref 30.0–36.0)
MCV: 91.3 fL (ref 80.0–100.0)
Platelets: 201 K/uL (ref 150–400)
RBC: 4.23 MIL/uL (ref 4.22–5.81)
RDW: 14.6 % (ref 11.5–15.5)
WBC: 8.1 K/uL (ref 4.0–10.5)
nRBC: 0 % (ref 0.0–0.2)

## 2024-10-06 LAB — I-STAT CHEM 8, ED
BUN: 19 mg/dL (ref 6–20)
Calcium, Ion: 1.08 mmol/L — ABNORMAL LOW (ref 1.15–1.40)
Chloride: 102 mmol/L (ref 98–111)
Creatinine, Ser: 1 mg/dL (ref 0.61–1.24)
Glucose, Bld: 129 mg/dL — ABNORMAL HIGH (ref 70–99)
HCT: 37 % — ABNORMAL LOW (ref 39.0–52.0)
Hemoglobin: 12.6 g/dL — ABNORMAL LOW (ref 13.0–17.0)
Potassium: 3.2 mmol/L — ABNORMAL LOW (ref 3.5–5.1)
Sodium: 141 mmol/L (ref 135–145)
TCO2: 26 mmol/L (ref 22–32)

## 2024-10-06 LAB — HEMOGLOBIN A1C
Hgb A1c MFr Bld: 5.6 % (ref 4.8–5.6)
Mean Plasma Glucose: 114.02 mg/dL

## 2024-10-06 LAB — HEPATIC FUNCTION PANEL
ALT: 19 U/L (ref 0–44)
AST: 17 U/L (ref 15–41)
Albumin: 3.6 g/dL (ref 3.5–5.0)
Alkaline Phosphatase: 80 U/L (ref 38–126)
Bilirubin, Direct: 0.1 mg/dL (ref 0.0–0.2)
Indirect Bilirubin: 0.6 mg/dL (ref 0.3–0.9)
Total Bilirubin: 0.7 mg/dL (ref 0.0–1.2)
Total Protein: 6.3 g/dL — ABNORMAL LOW (ref 6.5–8.1)

## 2024-10-06 LAB — MAGNESIUM: Magnesium: 2 mg/dL (ref 1.7–2.4)

## 2024-10-06 LAB — HIV ANTIBODY (ROUTINE TESTING W REFLEX): HIV Screen 4th Generation wRfx: NONREACTIVE

## 2024-10-06 LAB — BRAIN NATRIURETIC PEPTIDE: B Natriuretic Peptide: 97.3 pg/mL (ref 0.0–100.0)

## 2024-10-06 LAB — LIPASE, BLOOD: Lipase: 35 U/L (ref 11–51)

## 2024-10-06 MED ORDER — HEPARIN BOLUS VIA INFUSION
4000.0000 [IU] | Freq: Once | INTRAVENOUS | Status: AC
  Administered 2024-10-06: 4000 [IU] via INTRAVENOUS
  Filled 2024-10-06: qty 4000

## 2024-10-06 MED ORDER — CLONAZEPAM 0.5 MG PO TABS
1.0000 mg | ORAL_TABLET | Freq: Four times a day (QID) | ORAL | Status: DC
  Administered 2024-10-06 – 2024-10-07 (×3): 1 mg via ORAL
  Filled 2024-10-06 (×3): qty 2

## 2024-10-06 MED ORDER — BUSPIRONE HCL 5 MG PO TABS
10.0000 mg | ORAL_TABLET | Freq: Three times a day (TID) | ORAL | Status: DC
  Administered 2024-10-06 – 2024-10-07 (×3): 10 mg via ORAL
  Filled 2024-10-06 (×3): qty 2

## 2024-10-06 MED ORDER — APIXABAN 5 MG PO TABS
5.0000 mg | ORAL_TABLET | Freq: Two times a day (BID) | ORAL | Status: DC
  Administered 2024-10-06 – 2024-10-07 (×2): 5 mg via ORAL
  Filled 2024-10-06 (×2): qty 1

## 2024-10-06 MED ORDER — HEPARIN (PORCINE) 25000 UT/250ML-% IV SOLN
1500.0000 [IU]/h | INTRAVENOUS | Status: DC
  Administered 2024-10-06: 1500 [IU]/h via INTRAVENOUS
  Filled 2024-10-06: qty 250

## 2024-10-06 MED ORDER — SODIUM CHLORIDE 0.9 % IV BOLUS
1000.0000 mL | Freq: Once | INTRAVENOUS | Status: AC
  Administered 2024-10-06: 1000 mL via INTRAVENOUS

## 2024-10-06 MED ORDER — SODIUM CHLORIDE 0.9 % IV SOLN: INTRAVENOUS | Status: DC

## 2024-10-06 MED ORDER — POTASSIUM CHLORIDE 20 MEQ PO PACK
40.0000 meq | PACK | Freq: Two times a day (BID) | ORAL | Status: AC
  Administered 2024-10-06 – 2024-10-07 (×2): 40 meq via ORAL
  Filled 2024-10-06 (×2): qty 2

## 2024-10-06 MED ORDER — NITROGLYCERIN 0.4 MG SL SUBL: 0.4000 mg | SUBLINGUAL_TABLET | SUBLINGUAL | Status: DC | PRN

## 2024-10-06 MED ORDER — ONDANSETRON HCL 4 MG/2ML IJ SOLN: 4.0000 mg | Freq: Four times a day (QID) | INTRAMUSCULAR | Status: DC | PRN

## 2024-10-06 MED ORDER — DULOXETINE HCL 60 MG PO CPEP
60.0000 mg | ORAL_CAPSULE | Freq: Two times a day (BID) | ORAL | Status: DC
  Administered 2024-10-06 – 2024-10-07 (×2): 60 mg via ORAL
  Filled 2024-10-06 (×2): qty 1

## 2024-10-06 MED ORDER — ASPIRIN 81 MG PO CHEW: 324.0000 mg | CHEWABLE_TABLET | Freq: Once | ORAL | Status: DC

## 2024-10-06 MED ORDER — ACETAMINOPHEN 325 MG PO TABS: 650.0000 mg | ORAL_TABLET | ORAL | Status: DC | PRN

## 2024-10-06 MED ORDER — TAMSULOSIN HCL 0.4 MG PO CAPS
0.4000 mg | ORAL_CAPSULE | Freq: Every day | ORAL | Status: DC
  Administered 2024-10-07: 0.4 mg via ORAL
  Filled 2024-10-06: qty 1

## 2024-10-06 MED ORDER — LACTATED RINGERS IV SOLN: Freq: Once | INTRAVENOUS | Status: AC

## 2024-10-06 MED ORDER — LEVOTHYROXINE SODIUM 25 MCG PO TABS
137.0000 ug | ORAL_TABLET | Freq: Every day | ORAL | Status: DC
  Administered 2024-10-07: 137 ug via ORAL
  Filled 2024-10-06: qty 1

## 2024-10-06 MED ORDER — GABAPENTIN 300 MG PO CAPS
600.0000 mg | ORAL_CAPSULE | Freq: Four times a day (QID) | ORAL | Status: DC
  Administered 2024-10-06 – 2024-10-07 (×4): 600 mg via ORAL
  Filled 2024-10-06 (×4): qty 2

## 2024-10-06 MED ORDER — DALFAMPRIDINE ER 10 MG PO TB12: 10.0000 mg | ORAL_TABLET | Freq: Two times a day (BID) | ORAL | Status: DC

## 2024-10-06 MED ORDER — METOPROLOL TARTRATE 12.5 MG HALF TABLET
12.5000 mg | ORAL_TABLET | Freq: Four times a day (QID) | ORAL | Status: DC
  Administered 2024-10-06: 12.5 mg via ORAL
  Filled 2024-10-06 (×2): qty 1

## 2024-10-06 MED ORDER — APIXABAN 5 MG PO TABS: 10.0000 mg | ORAL_TABLET | Freq: Once | ORAL | Status: DC

## 2024-10-06 MED ORDER — PANTOPRAZOLE SODIUM 40 MG PO TBEC
40.0000 mg | DELAYED_RELEASE_TABLET | Freq: Two times a day (BID) | ORAL | Status: DC
  Administered 2024-10-06 – 2024-10-07 (×2): 40 mg via ORAL
  Filled 2024-10-06 (×2): qty 1

## 2024-10-06 MED ORDER — IOHEXOL 350 MG/ML SOLN
80.0000 mL | Freq: Once | INTRAVENOUS | Status: AC | PRN
  Administered 2024-10-06: 80 mL via INTRAVENOUS

## 2024-10-06 MED ORDER — ASPIRIN 81 MG PO TBEC
81.0000 mg | DELAYED_RELEASE_TABLET | Freq: Every day | ORAL | Status: DC
Start: 2024-10-07 — End: 2024-10-06

## 2024-10-06 MED ORDER — METOPROLOL TARTRATE 25 MG PO TABS
25.0000 mg | ORAL_TABLET | Freq: Once | ORAL | Status: AC
  Administered 2024-10-06: 25 mg via ORAL
  Filled 2024-10-06: qty 1

## 2024-10-06 NOTE — ED Notes (Signed)
 Pt gone to CT

## 2024-10-06 NOTE — ED Triage Notes (Signed)
 Pt started having chest pain around 9am this morning says pain was 10/10 radiating to both shoulders and up to bilateral ears and back. Pt administered Nitroglycerin  2 doses before calling 911. Pain decreased t0 7/10. EMS arrived and initial vitals was HR around 170 BP 132/90 cardiac rhythm showed Afib rvr. No HX of Afib. Cardizem 20mg  administered and 324mg  of aspirin . Vitals came down to BP 93/55 HR 130. Pt reports Indigestion for about a week.     Vitals BP 93/55 HR 130  18g to left arm.

## 2024-10-06 NOTE — ED Notes (Signed)
 Help get patient into a gown on the monitor did EKG shown to Dr Adela Lank patient is resting with call bell in reach

## 2024-10-06 NOTE — H&P (Signed)
 History and Physical    Patient: Keith Morrow FMW:[id number] DOB: August 06, 1971 DOA: 10/06/2024 DOS: the patient was seen and examined on 10/06/2024 . PCP: Domenica Harlene LABOR, MD  Patient coming from: Home Chief complaint: Chief Complaint  Patient presents with   Chest Pain   HPI:  Keith Morrow is a 53 y.o. male with past medical history  of  anxiety, depression, hypertension,obesity, sleep apnea and multiple sclerosis    hyperlipidemia, coronary artery disease s/p NSTEMI s/p stent placement to the LAD and PCI to the RCA, hypothyroidism, IDA s/p GI eval with egd and c-scopy with path results showing:FINAL MICROSCOPIC DIAGNOSIS:   A. DUODENAL, BIOPSY:  Duodenal mucosa with normal villous architecture.  No villous atrophy or increased intraepithelial lymphocytes.   B. GASTRIC NODULE, BIOPSY:  Hyperplastic gastric mucosa with hyperemia.  Negative for dysplasia and malignancy.  Negative for Helicobacter pylori.   C. COLON, ASCENDING, POLYPECTOMY:  Tubular adenoma (1) without high grade dysplasia.   >>Presents today with----complains of chest pain intermittent symptoms over the past 1 week or so.  Also reported symptoms of reflux and indigestion.  Chest pain radiating to both his shoulders and ears. Patient was noted to be in A-fib RVR by EMS.  ED Course:  Vital signs in the ED were notable for the following: Blood pressures were low initially have improved with MAP over 65 per report. Vitals:   10/06/24 1515 10/06/24 1526 10/06/24 1530 10/06/24 1545  BP: 103/65  (!) 91/58 100/65  Pulse: 80  (!) 58 82  Temp:  (!) 97.5 F (36.4 C)    Resp: (!) 9  11 20   Height:      Weight:      SpO2: 100%  100% 100%  TempSrc:  Oral    BMI (Calculated):       >>ED evaluation thus far shows: Initial CMP showing potassium 3.3 glucose 133 calcium  8.7 normal magnesium normal LFTs. Initial CBC shows white count of 8.1 hemoglobin of 12.5 platelets of 201 patient has history of IDA and has had GI  evaluation as above. Chest x-ray negative for any acute cardiopulmonary findings. EKG today shows irregular rhythm 91 PR 128 QRS of 98 QTc of 400, P waves are hard to discern due to artifact, but most probably a.fib.  In comparison to his previous EKG about a month ago he has become irregular, previously was diffusely low voltage notable P waves in lead II.       >>While in the ED patient received the following: Medications  aspirin  chewable tablet 324 mg (324 mg Oral Not Given 10/06/24 1050)  heparin bolus via infusion 4,000 Units (has no administration in time range)  heparin ADULT infusion 100 units/mL (25000 units/250mL) (has no administration in time range)  metoprolol  tartrate (LOPRESSOR ) tablet 25 mg (25 mg Oral Given 10/06/24 1244)  sodium chloride  0.9 % bolus 1,000 mL (1,000 mLs Intravenous New Bag/Given 10/06/24 1247)   Review of Systems  Cardiovascular:  Positive for chest pain.   Past Medical History:  Diagnosis Date   Acute bronchitis 05/09/2013   Allergy    Bronchitis, acute 12/10/2011   Candidal skin infection 05/05/2017   Chicken pox as a child   Constipation 02/20/2017   Coronary atherosclerosis of native coronary artery    a. NSTEMI 04/20/11: tx with Promus DES to LAD; bifurcation lesion with oDx 90% tx with POBA; b. staged PCI of right post. AV Branch with Promus DES; c. 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    Visual changes 11/13/2016   Past Surgical History:  Procedure Laterality Date   COLONOSCOPY N/A 08/04/2024   Procedure: COLONOSCOPY;  Surgeon: Legrand Victory LITTIE DOUGLAS, MD;  Location: [facility];  Service: Gastroenterology;  Laterality: N/A;   CORONARY ANGIOPLASTY WITH STENT PLACEMENT     2012   ESOPHAGOGASTRODUODENOSCOPY N/A 08/04/2024   Procedure: EGD (ESOPHAGOGASTRODUODENOSCOPY);  Surgeon: Legrand Victory LITTIE DOUGLAS, MD;  Location: [facility];  Service: Gastroenterology;  Laterality: N/A;   PAIN PUMP IMPLANTATION N/A 08/29/2014   Procedure: Baclofen  pump placement;  Surgeon: Fairy Levels, MD;  Location: [facility];  Service: Neurosurgery;  Laterality: N/A;  Baclofen  pump placement   PAIN PUMP IMPLANTATION Right 09/20/2020   Procedure: Right Baclofen  pump replacement;  Surgeon: Levels Fairy, MD;  Location: [facility] OR;  Service: Neurosurgery;  Laterality: Right;  Right Baclofen  pump replacement   PROGRAMABLE BACLOFEN  PUMP REVISION  08/29/14   right wrist surgery     CTR   VASCULAR SURGERY     vericose vein in right femoral area   VASECTOMY     WISDOM TOOTH EXTRACTION      reports that he has never smoked. He has never used smokeless tobacco. He reports current alcohol use. He reports that he does not use drugs. No Known Allergies Family History  Problem Relation Age of Onset   Heart attack Mother 50   Thyroid  disease Mother    Heart disease Mother    Thyroid  disease Sister    Thyroid  disease Brother    Heart disease Maternal Grandmother    Hypertension Maternal Grandmother    Hyperlipidemia Maternal Grandmother    Heart disease Maternal Grandfather    Hypertension Maternal Grandfather    Hyperlipidemia Maternal Grandfather    Stroke Paternal Grandfather    Thyroid  disease Sister    Heart disease Father        2 stents   Coronary artery disease Other        questionable in father   Prior to Admission medications   Medication Sig Start Date End Date Taking? Authorizing Provider   aspirin  EC 81 MG tablet Take 81 mg by mouth daily.   Yes [provider]  atorvastatin  (LIPITOR) 40 MG tablet TAKE 1 TABLET DAILY Patient taking differently: Take 40 mg by mouth at bedtime. 04/18/24  Yes Wonda Sharper, MD  BACLOFEN  IT 1 Dose by Intrathecal route as directed. Lioresal  1876mcg/459.9mcg daily, Clonidine 447mcg/109.65mcg daily/hydromorphone  480mcg/104.4mcg daily/baclofen  459.46mcg/29.9mcg daily   Yes [provider]  busPIRone  (BUSPAR ) 10 MG tablet TAKE 1 TABLET IN THE MORNING, AT NOON, IN THE EVENING AND AT BEDTIME Patient taking differently: Take 10 mg by mouth 4 (four) times daily - after meals and at bedtime. 04/18/24  Yes Domenica Harlene LABOR, MD  cetirizine (ZYRTEC) 10 MG tablet Take 10 mg by mouth daily as  needed for allergies.   Yes [provider]  clonazePAM  (KLONOPIN ) 1 MG tablet Take 1 tablet (1 mg total) by mouth 4 (four) times daily. 03/12/23  Yes Sater, Charlie LABOR, MD  cyclobenzaprine  (FLEXERIL ) 10 MG tablet TAKE 1 TABLET BY MOUTH THREE TIMES A DAY AS NEEDED FOR MUSCLE SPASMS Patient taking differently: Take 10 mg by mouth at bedtime. 06/08/23  Yes Sater, Charlie LABOR, MD  CYMBALTA  60 MG capsule Take 1 capsule (60 mg total) by mouth 2 (two) times daily. 08/18/24  Yes Domenica Harlene LABOR, MD  dalfampridine  10 MG TB12 Take 1 tablet (10 mg total) by mouth every 12 (twelve) hours. 08/18/24  Yes Sater, Charlie LABOR, MD  DULoxetine  (CYMBALTA ) 60 MG capsule Take 60 mg by mouth 2 (two) times daily.   Yes [provider]  furosemide  (LASIX ) 20 MG tablet TAKE 1 TABLET (20 MG TOTAL) BY MOUTH DAILY X 5 DAYS, THEN AS NEEDED FOR FOR SHORTNESS OF BREATH/WEIGHT GAIN>3#, PEDAL EDEMA AND CAN TAKE A SECOND TAB DAILY AS NEEDED Patient taking differently: Take 20 mg by mouth daily as needed for fluid or edema. Take 20 mg by mouth in the morning and an additional 20 mg once a day as needed for shortness of breath or a weight gain of 3 pounds/pedal edema 07/20/24  Yes Domenica Harlene LABOR, MD  gabapentin  (NEURONTIN ) 600 MG tablet Take 1 tablet (600 mg total) by mouth 4 (four) times daily. 03/29/24  Yes Sater, Charlie LABOR, MD  ibuprofen (ADVIL) 200 MG tablet Take 200 mg by mouth every 6 (six) hours as needed for moderate pain (pain score 4-6).   Yes [provider]  ketoconazole  (NIZORAL ) 2 % shampoo APPLY TOPICALLY 2 TIMES A WEEK 07/27/23  Yes Domenica Harlene LABOR, MD  methylphenidate  (RITALIN ) 10 MG tablet Take 1 tablet (10 mg total) by mouth 4 (four) times daily. 08/09/24  Yes Sater, Charlie LABOR, MD  natalizumab (TYSABRI) 300 MG/15ML injection Inject 300 mg into the vein every 28 (twenty-eight) days.    Yes [provider]  nitroGLYCERIN  (NITROSTAT ) 0.4 MG SL tablet PLACE 1 TABLET UNDER THE TOUNGE EVERY 5 MINUTES AS NEEDED FOR CHEST PAIN (X 3 DOSES) 04/28/23  Yes Wonda Sharper, MD  nystatin  cream (MYCOSTATIN ) Apply 1 application  topically as directed. 05/20/22  Yes Domenica Harlene LABOR, MD  olmesartan -hydrochlorothiazide (BENICAR  HCT) 20-12.5 MG tablet TAKE 1 TABLET DAILY 04/18/24  Yes Domenica Harlene LABOR, MD  pantoprazole  (PROTONIX ) 40 MG tablet TAKE 1 TABLET BY MOUTH TWICE A DAY 04/05/24  Yes Danis, Victory CROME III, MD  potassium chloride  SA (KLOR-CON  M20) 20 MEQ tablet TAKE 1 TABLET BY MOUTH EVERY DAY AS NEEDED WHEN TAKING A SECOND LASIX  TAB ANY GIVEN DAY Patient taking differently: Take 20 mEq by mouth once. Take 20 mEq by mouth once a day when a second dose of Furosemide  has been taken 04/02/21  Yes Parthenia Olivia HERO, PA-C  sucralfate  (CARAFATE ) 1 g tablet TAKE 1 TABLET FOUR TIMES A DAY 10/14/23  Yes Domenica Harlene LABOR, MD  SYNTHROID  137 MCG tablet Take 137 mcg by mouth daily before breakfast.   Yes [provider]  tamsulosin  (FLOMAX ) 0.4 MG CAPS capsule TAKE 1 CAPSULE DAILY 03/21/24  Yes Domenica Harlene LABOR, MD  Vitals:   10/06/24 1515 10/06/24 1526 10/06/24 1530 10/06/24 1545  BP: 103/65  (!) 91/58  100/65  Pulse: 80  (!) 58 82  Resp: (!) 9  11 20   Temp:  (!) 97.5 F (36.4 C)    TempSrc:  Oral    SpO2: 100%  100% 100%  Weight:      Height:       Physical Exam Vitals reviewed.  Constitutional:      General: He is not in acute distress.    Appearance: He is not ill-appearing.  HENT:     Head: Normocephalic.  Eyes:     Extraocular Movements: Extraocular movements intact.  Cardiovascular:     Rate and Rhythm: Normal rate and regular rhythm.     Heart sounds: Normal heart sounds.  Pulmonary:     Breath sounds: Normal breath sounds.  Abdominal:     General: There is no distension.     Palpations: Abdomen is soft.     Tenderness: There is no abdominal tenderness.  Neurological:     General: No focal deficit present.     Mental Status: He is alert and oriented to person, place, and time.     Labs on Admission: I have personally reviewed following labs and imaging studies CBC: Recent Labs  Lab 10/06/24 1037 10/06/24 1051  WBC 8.1  --   HGB 12.5* 12.6*  HCT 38.6* 37.0*  MCV 91.3  --   PLT 201  --    Basic Metabolic Panel: Recent Labs  Lab 10/06/24 1037 10/06/24 1051  NA 140 141  K 3.3* 3.2*  CL 104 102  CO2 25  --   GLUCOSE 133* 129*  BUN 18 19  CREATININE 0.92 1.00  CALCIUM  8.7*  --   MG 2.0  --    GFR: Estimated Creatinine Clearance: 140.1 mL/min (by C-G formula based on SCr of 1 mg/dL). Liver Function Tests: Recent Labs  Lab 10/06/24 1037  AST 17  ALT 19  ALKPHOS 80  BILITOT 0.7  PROT 6.3*  ALBUMIN 3.6   Recent Labs  Lab 10/06/24 1037  LIPASE 35   No results for input(s): AMMONIA in the last 168 hours. Recent Labs    01/11/24 1458 06/06/24 1542 08/03/24 1326 08/04/24 0422 10/06/24 1037 10/06/24 1051  BUN 15 16 15 14 18 19   CREATININE 1.01 0.95 0.95 0.92 0.92 1.00    Cardiac Enzymes: No results for input(s): CKTOTAL, CKMB, CKMBINDEX, TROPONINI in the last 168 hours. BNP (last 3 results) No results for input(s):  PROBNP in the last 8760 hours. HbA1C: No results for input(s): HGBA1C in the last 72 hours. CBG: No results for input(s): GLUCAP in the last 168 hours. Lipid Profile: No results for input(s): CHOL, HDL, LDLCALC, TRIG, CHOLHDL, LDLDIRECT in the last 72 hours. Thyroid  Function Tests: No results for input(s): TSH, T4TOTAL, FREET4, T3FREE, THYROIDAB in the last 72 hours. Anemia Panel: No results for input(s): VITAMINB12, FOLATE, FERRITIN, TIBC, IRON, RETICCTPCT in the last 72 hours. Urine analysis:    Component Value Date/Time   COLORURINE YELLOW 02/11/2016 1539   APPEARANCEUR CLEAR 02/11/2016 1539   LABSPEC <=1.005 (A) 02/11/2016 1539   PHURINE 7.5 02/11/2016 1539   GLUCOSEU NEGATIVE 02/11/2016 1539   HGBUR NEGATIVE 02/11/2016 1539   BILIRUBINUR NEGATIVE 02/11/2016 1539   KETONESUR NEGATIVE 02/11/2016 1539   UROBILINOGEN 0.2 02/11/2016 1539   NITRITE NEGATIVE 02/11/2016 1539   LEUKOCYTESUR NEGATIVE 02/11/2016 1539   Radiological Exams on Admission: DG Chest 1  View Result Date: 10/06/2024 EXAM: 1 VIEW(S) XRAY OF THE CHEST 10/06/2024 10:51:00 AM COMPARISON: 04/21/2023 CLINICAL HISTORY: Chest pain FINDINGS: LINES, TUBES AND DEVICES: Overlying monitor wires. LUNGS AND PLEURA: No focal pulmonary opacity. No pulmonary edema. No pleural effusion. No pneumothorax. HEART AND MEDIASTINUM: No acute abnormality of the cardiac and mediastinal silhouettes. BONES AND SOFT TISSUES: Thoracic spondylosis. IMPRESSION: 1. No acute cardiopulmonary pathology. Electronically signed by: Franky Stanford MD 10/06/2024 11:14 AM EDT RP Workstation: [id number]   Data Reviewed: Relevant notes from primary care and specialist visits, past discharge summaries as available in EHR, including Care Everywhere . Prior diagnostic testing as pertinent to current admission diagnoses, Updated medications and problem lists for reconciliation .ED course, including vitals, labs, imaging,  treatment and response to treatment,Triage notes, nursing and pharmacy notes and ED provider's notes.Notable results as noted in HPI.Discussed case with EDMD/ ED APP/ or Specialty MD on call and as needed.  Assessment & Plan  >> Chest pain/history of CAD: Attributed to patient's A-fib RVR and increased demand.  EKG is irregular and with multiple artifacts although no specific ST changes in lateral leads.  Troponins elevated from 3-21 with pos delta started on ACS protocol with heparin gtt. and aspirin .  Cardiology consulted. As needed nitroglycerin .  Beta-blocker as tolerated currently on hold due to hypotension. After bedside eval pt reports pain and le edema and is bed bound 2/2 MS  and still hypotensive will get stat CTA chest to rule our PE.   >> A-fib RVR: Currently rate controlled as patient was given Lopressor , continue with heparin gtt.   >> Obesity with a BMI of 43.12: Will obtain TFTs and A1c.  >> Multiple sclerosis: Currently patient is on Tysambri, and Dalfambridine.    >> Iron deficiency anemia: Daily CBC follow type and screen, IV PPI, stool occult x 3.   >> Essential hypertension: Home regimen with Benicar  HCTZ, Lasix , held due to hypotension.  >> GERD: IV PPI and aspiration precaution n.p.o. after midnight.   >> OSA: CPAP per home settings.   >> Hypothyroidism: Continue Synthroid  137 mcg.   DVT prophylaxis:  Heparin GTT Consults:  Cardiology  Advance Care Planning:    Code Status: Full Code   Family Communication:  None Disposition Plan:  Home Severity of Illness: The appropriate patient status for this patient is INPATIENT. Inpatient status is judged to be reasonable and necessary in order to provide the required intensity of service to ensure the patient's safety. The patient's presenting symptoms, physical exam findings, and initial radiographic and laboratory data in the context of their chronic comorbidities is felt to place them at high risk  for further clinical deterioration. Furthermore, it is not anticipated that the patient will be medically stable for discharge from the hospital within 2 midnights of admission.   * I certify that at the point of admission it is my clinical judgment that the patient will require inpatient hospital care spanning beyond 2 midnights from the point of admission due to high intensity of service, high risk for further deterioration and high frequency of surveillance required.*  Unresulted Labs (From admission, onward)     Start     Ordered   10/07/24 0500  Heparin level (unfractionated)  Daily,   R      10/06/24 1423   10/06/24 2100  Heparin level (unfractionated)  Once-Timed,   URGENT        10/06/24 1423   10/06/24 1546  HIV Antibody (routine testing w rflx)  (HIV Antibody (Routine  testing w reflex) panel)  Once,   R        10/06/24 1547   10/06/24 1520  Hemoglobin A1c  Add-on,   AD        10/06/24 1519   10/06/24 1520  Brain natriuretic peptide  Add-on,   AD        10/06/24 1520            Meds ordered this encounter  Medications   aspirin  chewable tablet 324 mg   metoprolol  tartrate (LOPRESSOR ) tablet 25 mg   sodium chloride  0.9 % bolus 1,000 mL   heparin bolus via infusion 4,000 Units   heparin ADULT infusion 100 units/mL (25000 units/250mL)   acetaminophen  (TYLENOL ) tablet 650 mg   ondansetron  (ZOFRAN ) injection 4 mg   lactated ringers  infusion   aspirin  EC tablet 81 mg   busPIRone  (BUSPAR ) tablet 10 mg   clonazePAM  (KLONOPIN ) tablet 1 mg   DULoxetine  (CYMBALTA ) DR capsule 60 mg   dalfampridine  TB12 10 mg   gabapentin  (NEURONTIN ) tablet 600 mg   nitroGLYCERIN  (NITROSTAT ) SL tablet 0.4 mg   levothyroxine  (SYNTHROID ) tablet 137 mcg   tamsulosin  (FLOMAX ) capsule 0.4 mg   pantoprazole  (PROTONIX ) EC tablet 40 mg     Orders Placed This Encounter  Procedures   Critical Care   DG Chest 1 View   CT Angio Chest PE W and/or Wo Contrast   Basic metabolic panel   CBC   Hepatic  function panel   Lipase, blood   Magnesium   Heparin level (unfractionated)   Heparin level (unfractionated)   Hemoglobin A1c   Brain natriuretic peptide   HIV Antibody (routine testing w rflx)   Diet Heart Room service appropriate? Yes; Fluid consistency: Thin   ED Cardiac monitoring   Initiate Carrier Fluid Protocol   Refer to Sidebar Report Mobility Protocol for Adult Inpatient   Apply Angina, Rule Out Myocardial Infarction Care Plan   Vital signs with O2 sat q4 hours x 24 hours, then q shift   RN may order Cardiology PRN Orders utilizing Cardiology PRN medications (through manage orders) for the following patient needs:   Ambulate with assistance   Cardiac Monitoring - Continuous Indefinite   Full code   Inpatient consult to Cardiology   heparin per pharmacy consult   Consult to hospitalist   ED Pulse oximetry, continuous   CBG monitoring, ED   I-stat chem 8, ED (not at [facility], [facility] or [facility])   EKG 12-Lead   ED EKG   EKG 12-Lead (recurrent chest pain)   ECHOCARDIOGRAM COMPLETE BUBBLE STUDY   ECHOCARDIOGRAM COMPLETE   Insert peripheral IV   Admit to Inpatient (patient's expected length of stay will be greater than 2 midnights or inpatient only procedure)   Aspiration precautions   Fall precautions    Author: Mario LULLA Blanch, MD 12 pm- 8 pm. [facility]. 10/06/2024 4:07 PM Please note for any communication after hours contact [facility] Assigned provider on call on Amion.

## 2024-10-06 NOTE — Consult Note (Addendum)
 Cardiology Consultation   Patient ID: Keith Morrow MRN: [id number]; DOB: 02-24-1971  Admit date: 10/06/2024 Date of Consult: 10/06/2024  PCP:  Domenica Harlene LABOR, MD   [facility] Providers Cardiologist:  Ozell Fell, MD      Patient Profile: Keith Morrow is a 53 y.o. male with a hx of multiple sclerosis (has been wheelchair-bound for the past 10 years.),  Depression, anxiety hypertension, hyperlipidemia, and CAD s/p stenting to LAD and RCA in 2012 who is being seen 10/06/2024 for the evaluation of atrial fibrillation at the request of Mario Blanch.  History of Present Illness: Keith Morrow is a 53 year old male with prior cardiac history listed below  Was hospitalized for an NSTEMI in 2012 received PCI to the LAD and RCA.  Most recent echo prior to admission was performed on 01/2020 and showed a normal LVEF of 60 to 65%, no LVH, no RWMA, normal left and right atrial size, and grossly normal valve function.  A myocardial perfusion with Lexiscan  was performed on 12/2015.  Prior area of infarct was seen but no other significant ischemia was noted.  Patient presented to the emergency department complaining of chest pain.  On interview patient reported that he has multiple sclerosis and has been unable to ambulate for 10 years.  Has a history of OSA and is compliant with CPAP.  There are concerns with CPAP machine not working has a sleep study in December to assess if he needs a BiPAP.  Reported that he had chest discomfort at 9 AM and his Apple watch gave him an alert that he was in atrial fibrillation.  Reported that he had heart rates in the 100s to 130s.  When EMS came reported that heart rates were in the 170s.  Chest pain has improved with rate control but still continues to have ongoing chest pain. Denies nausea, vomiting, fever, chills, diaphoresis, melena, hematuria, hematochezia, lower extremity edema, and orthopnea. Denies nicotine use, alcohol use, illicit substance  use.  Labs showed hypokalemia of 3.2, hyponatremia of 141, BUN of 19, creatinine of 1.0, high-sensitivity troponins 3 > 21, hemoglobin of 12.5, of WBC 8.1.  Chest x-ray showed no acute cardiopulmonary process.  EKG showed atrial fibrillation with a heart rate of 91.  Past Medical History:  Diagnosis Date   Acute bronchitis 05/09/2013   Allergy    Bronchitis, acute 12/10/2011   Candidal skin infection 05/05/2017   Chicken pox as a child   Constipation 02/20/2017   Coronary atherosclerosis of native coronary artery    a. NSTEMI 04/20/11: tx with Promus DES to LAD; bifurcation lesion with oDx 90% tx with POBA; b. staged PCI of right post. AV Branch with Promus DES; c. 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   Visual changes 11/13/2016    Past Surgical History:  Procedure Laterality  Date   COLONOSCOPY N/A 08/04/2024   Procedure: COLONOSCOPY;  Surgeon: Legrand Victory LITTIE DOUGLAS, MD;  Location: [facility];  Service: Gastroenterology;  Laterality: N/A;   CORONARY ANGIOPLASTY WITH STENT PLACEMENT     2012   ESOPHAGOGASTRODUODENOSCOPY N/A 08/04/2024   Procedure: EGD (ESOPHAGOGASTRODUODENOSCOPY);  Surgeon: Legrand Victory LITTIE DOUGLAS, MD;  Location: [facility];  Service: Gastroenterology;  Laterality: N/A;   PAIN PUMP IMPLANTATION N/A 08/29/2014   Procedure: Baclofen  pump placement;  Surgeon: Fairy Levels, MD;  Location: [facility];  Service:  Neurosurgery;  Laterality: N/A;  Baclofen  pump placement   PAIN PUMP IMPLANTATION Right 09/20/2020   Procedure: Right Baclofen  pump replacement;  Surgeon: Levels Fairy, MD;  Location: [facility];  Service: Neurosurgery;  Laterality: Right;  Right Baclofen  pump replacement   PROGRAMABLE BACLOFEN  PUMP REVISION  08/29/14   right wrist surgery     CTR   VASCULAR SURGERY     vericose vein in right femoral area   VASECTOMY     WISDOM TOOTH EXTRACTION       Home Medications:  Prior to Admission medications   Medication Sig Start Date End Date Taking? Authorizing Provider  aspirin  EC 81 MG tablet Take 81 mg by mouth daily.   Yes [provider]  atorvastatin  (LIPITOR) 40 MG tablet TAKE 1 TABLET DAILY Patient taking differently: Take 40 mg by mouth at bedtime. 04/18/24  Yes Wonda Sharper, MD  BACLOFEN  IT 1 Dose by Intrathecal route as directed. Lioresal  1827mcg/459.9mcg daily, Clonidine 429mcg/109.65mcg daily/hydromorphone  449mcg/104.4mcg daily/baclofen  459.64mcg/29.9mcg daily   Yes [provider]  busPIRone  (BUSPAR ) 10 MG tablet TAKE 1 TABLET IN THE MORNING, AT NOON, IN THE EVENING AND AT BEDTIME Patient taking differently: Take 10 mg by mouth 4 (four) times daily - after meals and at bedtime. 04/18/24  Yes Domenica Harlene LABOR, MD  cetirizine (ZYRTEC) 10 MG tablet Take 10 mg by mouth daily as needed for allergies.   Yes [provider]  clonazePAM  (KLONOPIN ) 1 MG tablet Take 1 tablet (1 mg total) by mouth 4 (four) times daily. 03/12/23  Yes Sater, Charlie LABOR, MD  cyclobenzaprine  (FLEXERIL ) 10 MG tablet TAKE 1 TABLET BY MOUTH THREE TIMES A DAY AS NEEDED FOR MUSCLE SPASMS Patient taking differently: Take 10 mg by mouth at bedtime. 06/08/23  Yes Sater, Charlie LABOR, MD  CYMBALTA  60 MG capsule Take 1 capsule (60 mg total) by mouth 2 (two) times daily. 08/18/24  Yes Domenica Harlene LABOR, MD  dalfampridine  10 MG TB12 Take 1 tablet (10 mg total) by mouth every 12 (twelve) hours. 08/18/24  Yes Sater,  Charlie LABOR, MD  DULoxetine  (CYMBALTA ) 60 MG capsule Take 60 mg by mouth 2 (two) times daily.   Yes [provider]  furosemide  (LASIX ) 20 MG tablet TAKE 1 TABLET (20 MG TOTAL) BY MOUTH DAILY X 5 DAYS, THEN AS NEEDED FOR FOR SHORTNESS OF BREATH/WEIGHT GAIN>3#, PEDAL EDEMA AND CAN TAKE A SECOND TAB DAILY AS NEEDED Patient taking differently: Take 20 mg by mouth daily as needed for fluid or edema. Take 20 mg by mouth in the morning and an additional 20 mg once a day as needed for shortness of breath or a weight gain of 3 pounds/pedal edema 07/20/24  Yes Domenica Harlene LABOR, MD  gabapentin  (NEURONTIN ) 600 MG tablet Take 1 tablet (600 mg total) by mouth 4 (four) times daily. 03/29/24  Yes Sater, Charlie LABOR, MD  ibuprofen (ADVIL) 200 MG tablet Take 200 mg by mouth every 6 (six) hours as  needed for moderate pain (pain score 4-6).   Yes [provider]  ketoconazole  (NIZORAL ) 2 % shampoo APPLY TOPICALLY 2 TIMES A WEEK 07/27/23  Yes Domenica Harlene LABOR, MD  methylphenidate  (RITALIN ) 10 MG tablet Take 1 tablet (10 mg total) by mouth 4 (four) times daily. 08/09/24  Yes Sater, Charlie LABOR, MD  natalizumab (TYSABRI) 300 MG/15ML injection Inject 300 mg into the vein every 28 (twenty-eight) days.    Yes [provider]  nitroGLYCERIN  (NITROSTAT ) 0.4 MG SL tablet PLACE 1 TABLET UNDER THE TOUNGE EVERY 5 MINUTES AS NEEDED FOR CHEST PAIN (X 3 DOSES) 04/28/23  Yes Wonda Sharper, MD  nystatin  cream (MYCOSTATIN ) Apply 1 application  topically as directed. 05/20/22  Yes Domenica Harlene LABOR, MD  olmesartan -hydrochlorothiazide (BENICAR  HCT) 20-12.5 MG tablet TAKE 1 TABLET DAILY 04/18/24  Yes Domenica Harlene LABOR, MD  pantoprazole  (PROTONIX ) 40 MG tablet TAKE 1 TABLET BY MOUTH TWICE A DAY 04/05/24  Yes Danis, Victory CROME III, MD  potassium chloride  SA (KLOR-CON  M20) 20 MEQ tablet TAKE 1 TABLET BY MOUTH EVERY DAY AS NEEDED WHEN TAKING A SECOND LASIX  TAB ANY GIVEN DAY Patient taking differently: Take 20 mEq by mouth once. Take 20 mEq  by mouth once a day when a second dose of Furosemide  has been taken 04/02/21  Yes Parthenia Olivia HERO, PA-C  sucralfate  (CARAFATE ) 1 g tablet TAKE 1 TABLET FOUR TIMES A DAY 10/14/23  Yes Domenica Harlene LABOR, MD  SYNTHROID  137 MCG tablet Take 137 mcg by mouth daily before breakfast.   Yes [provider]  tamsulosin  (FLOMAX ) 0.4 MG CAPS capsule TAKE 1 CAPSULE DAILY 03/21/24  Yes Domenica Harlene LABOR, MD    Scheduled Meds:  apixaban  5 mg Oral BID   aspirin   324 mg Oral Once   busPIRone   10 mg Oral TID PC & HS   clonazePAM   1 mg Oral QID   dalfampridine   10 mg Oral Q12H   DULoxetine   60 mg Oral BID   gabapentin   600 mg Oral QID   [START ON 10/07/2024] levothyroxine   137 mcg Oral Q0600   pantoprazole   40 mg Oral BID   potassium chloride   40 mEq Oral BID   [START ON 10/07/2024] tamsulosin   0.4 mg Oral Daily   Continuous Infusions:  lactated ringers      PRN Meds: acetaminophen , iohexol , nitroGLYCERIN , ondansetron  (ZOFRAN ) IV  Allergies:   No Known Allergies  Social History:   Social History   Socioeconomic History   Marital status: Married    Spouse name: Not on file   Number of children: 3   Years of education: Not on file   Highest education level: Bachelor's degree (e.g., BA, AB, BS)  Occupational History   Not on file  Tobacco Use   Smoking status: Never   Smokeless tobacco: Never  Vaping Use   Vaping status: Never Used  Substance and Sexual Activity   Alcohol use: Yes    Comment: socially   Drug use: No   Sexual activity: Yes    Partners: Female    Comment: 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, works Engineer, Maintenance, no dietary restrictions  Other Topics Concern   Not on file  Social History Narrative   Not on file   Social Drivers of Health   Financial Resource Strain: Low Risk  (06/06/2024)   Overall Financial Resource Strain (CARDIA)    Difficulty of Paying Living Expenses: Not hard at all  Food Insecurity: No Food Insecurity (08/03/2024)   Hunger  Vital Sign     Worried About Programme Researcher, Broadcasting/film/video in the Last Year: Never true    Ran Out of Food in the Last Year: Never true  Transportation Needs: No Transportation Needs (08/03/2024)   PRAPARE - Administrator, Civil Service (Medical): No    Lack of Transportation (Non-Medical): No  Physical Activity: Inactive (06/06/2024)   Exercise Vital Sign    Days of Exercise per Week: 0 days    Minutes of Exercise per Session: Not on file  Stress: Stress Concern Present (06/06/2024)   [employer] of Occupational Health - Occupational Stress Questionnaire    Feeling of Stress: Rather much  Social Connections: Unknown (08/03/2024)   Social Connection and Isolation Panel    Frequency of Communication with Friends and Family: Patient declined    Frequency of Social Gatherings with Friends and Family: Patient declined    Attends Religious Services: Patient declined    Database Administrator or Organizations: No    Attends Engineer, Structural: Patient declined    Marital Status: Married  Catering Manager Violence: Not At Risk (08/03/2024)   Humiliation, Afraid, Rape, and Kick questionnaire    Fear of Current or Ex-Partner: No    Emotionally Abused: No    Physically Abused: No    Sexually Abused: No    Family History:    Family History  Problem Relation Age of Onset   Heart attack Mother 52   Thyroid  disease Mother    Heart disease Mother    Thyroid  disease Sister    Thyroid  disease Brother    Heart disease Maternal Grandmother    Hypertension Maternal Grandmother    Hyperlipidemia Maternal Grandmother    Heart disease Maternal Grandfather    Hypertension Maternal Grandfather    Hyperlipidemia Maternal Grandfather    Stroke Paternal Grandfather    Thyroid  disease Sister    Heart disease Father        2 stents   Coronary artery disease Other        questionable in father     ROS:  Please see the history of present illness.   All other ROS reviewed and negative.      Physical Exam/Data: Vitals:   10/06/24 1526 10/06/24 1530 10/06/24 1545 10/06/24 1715  BP:  (!) 91/58 100/65 106/63  Pulse:  (!) 58 82 85  Resp:  11 20   Temp: (!) 97.5 F (36.4 C)     TempSrc: Oral     SpO2:  100% 100% 100%  Weight:      Height:        Intake/Output Summary (Last 24 hours) at 10/06/2024 1801 Last data filed at 10/06/2024 1719 Gross per 24 hour  Intake 1000 ml  Output 600 ml  Net 400 ml      10/06/2024   10:24 AM 07/27/2024    2:57 PM 06/06/2024    2:22 PM  Last 3 Weights  Weight (lbs) 345 lb 328 lb 328 lb 9.6 oz  Weight (kg) 156.491 kg 148.78 kg 149.052 kg     Body mass index is 43.12 kg/m.  General:  Well nourished, well developed, in no acute distress.  Alert and orientated on room air HEENT: normal Neck: no JVD Vascular: No carotid bruits; Distal pulses 2+ bilaterally Cardiac:  normal S1, S2; RRR; no murmur  Lungs:  clear to auscultation bilaterally, no wheezing, rhonchi or rales  Abd: soft, nontender, no hepatomegaly  Ext: no edema Musculoskeletal:  No deformities. Skin: warm and dry  Neuro:  no focal abnormalities noted Psych:  Normal affect   EKG:  The EKG was personally reviewed and demonstrates:  showed atrial fibrillation with a heart rate of 91 and inferior lateral T wave inversions. Telemetry:  Telemetry was personally reviewed and demonstrates: Atrial fibrillation with heart rates in the 80s  Relevant CV Studies: Echo pending  Laboratory Data: High Sensitivity Troponin:   Recent Labs  Lab 10/06/24 1037 10/06/24 1242  TROPONINIHS 3 21*     Chemistry Recent Labs  Lab 10/06/24 1037 10/06/24 1051  NA 140 141  K 3.3* 3.2*  CL 104 102  CO2 25  --   GLUCOSE 133* 129*  BUN 18 19  CREATININE 0.92 1.00  CALCIUM  8.7*  --   MG 2.0  --   GFRNONAA >60  --   ANIONGAP 11  --     Recent Labs  Lab 10/06/24 1037  PROT 6.3*  ALBUMIN 3.6  AST 17  ALT 19  ALKPHOS 80  BILITOT 0.7   Lipids No results for input(s): CHOL,  TRIG, HDL, LABVLDL, LDLCALC, CHOLHDL in the last 168 hours.  Hematology Recent Labs  Lab 10/06/24 1037 10/06/24 1051  WBC 8.1  --   RBC 4.23  --   HGB 12.5* 12.6*  HCT 38.6* 37.0*  MCV 91.3  --   MCH 29.6  --   MCHC 32.4  --   RDW 14.6  --   PLT 201  --    Thyroid  No results for input(s): TSH, FREET4 in the last 168 hours.  BNP Recent Labs  Lab 10/06/24 1037  BNP 97.3    DDimer No results for input(s): DDIMER in the last 168 hours.  Radiology/Studies:  DG Chest 1 View Result Date: 10/06/2024 EXAM: 1 VIEW(S) XRAY OF THE CHEST 10/06/2024 10:51:00 AM COMPARISON: 04/21/2023 CLINICAL HISTORY: Chest pain FINDINGS: LINES, TUBES AND DEVICES: Overlying monitor wires. LUNGS AND PLEURA: No focal pulmonary opacity. No pulmonary edema. No pleural effusion. No pneumothorax. HEART AND MEDIASTINUM: No acute abnormality of the cardiac and mediastinal silhouettes. BONES AND SOFT TISSUES: Thoracic spondylosis. IMPRESSION: 1. No acute cardiopulmonary pathology. Electronically signed by: Franky Stanford MD 10/06/2024 11:14 AM EDT RP Workstation: [id number]     Assessment and Plan:  Keith Morrow is a 53 y.o. male with a hx of multiple sclerosis (has been wheelchair-bound for the past 10 years.),  Depression, anxiety hypertension, hyperlipidemia, and CAD s/p stenting to LAD and RCA in 2012 who is being seen 10/06/2024 for the evaluation of atrial fibrillation at the request of Mario Blanch.  New onset atrial fibrillation CHA2DS2-VASc Score = 2 [CHF History: 0, HTN History: 1, Diabetes History: 0, Stroke History: 0, Vascular Disease History: 1, Age Score: 0, Gender Score: 0].  Therefore, the patient's annual risk of stroke is 2.2 %.    Denies alcohol use.  Has OSA and is compliant with CPAP. creatinine of 1.0, potassium 3.2 ordered potassium replaced.  Magnesium 2.0 Ordered TSH Stop IV heparin Start Eliquis 5 mg twice daily Schedule for TEE cardioversion Start metoprolol  tartrate  12.5 mg every 6 hours.   CAD s/p stenting to LAD and RCA in 2012 Hyperlipidemia Elevated high-sensitivity troponin 3 > 21 In addition to A-fib had chest discomfort.  Chest pain has improved with rate control but still continues to have ongoing chest pain.  Continue to trend troponin May consider outpatient nuclear stress test Stop aspirin  81 mg daily due to starting Eliquis Continue atorvastatin   40 mg daily   Obstructive sleep apnea Has a history of OSA and is compliant with CPAP.  There are concerns with CPAP machine not working has a sleep study in December to assess if he needs a BiPAP.    Multiple sclerosis  has been wheelchair-bound for the past 10 years. Management per primary   Hypertension Hold home olmesartan  hydrochlorothiazide.  Will prioritize rate control   Otherwise management per primary   Risk Assessment/Risk Scores:  TIMI Risk Score for Unstable Angina or Non-ST Elevation MI:   The patient's TIMI risk score is 4, which indicates a 20% risk of all cause mortality, new or recurrent myocardial infarction or need for urgent revascularization in the next 14 days.    CHA2DS2-VASc Score = 2  } This indicates a 2.2% annual risk of stroke. The patient's score is based upon: CHF History: 0 HTN History: 1 Diabetes History: 0 Stroke History: 0 Vascular Disease History: 1 Age Score: 0 Gender Score: 0        For questions or updates, please contact [facility] Please consult [email or web address] for contact info under    Signed, Morse Clause, PA-C  10/06/2024 6:01 PM  I have seen and examined the patient along with Zane Adams, PA-C.  I have reviewed the chart, notes and new data.  I agree with PA/NP's note.  Key new complaints: He has actually had some chest discomfort off and on for about 10 days or 2 weeks.  He did not think it was his heart because it was somewhat positional.  However, the symptoms worsened today when he developed atrial  fibrillation.  With rate control, his chest discomfort has improved, but it is still 4/10. Key examination changes: Obese, normal jugular venous pulsations and no edema, clear lungs, regular rhythm, no murmurs rubs or gallops. Key new findings / data: ECG shows atrial fibrillation, now well rate controlled, mild ST segment depression and T wave inversion in the inferolateral leads.  Similar, slightly more subtle changes were also seen on his previous ECG in sinus rhythm on 07/27/2024, but were not seen on an older ECG from 04/22/2023.  Marginal increase in high-sensitivity troponin I at 21 is probably not clinically significant.  Normal renal function parameters but mildly hypokalemic with potassium 3.2.  PLAN: Symptomatic atrial fibrillation rapid ventricular response, first occurrence. It sounds like symptoms of coronary insufficiency may have preceded the atrial fibrillation suggesting he has new coronary obstruction, but he does not appear to have a clear acute atherothrombotic coronary syndrome. Will start Eliquis today with plan for TEE guided cardioversion if he is still in atrial fibrillation tomorrow. If regional wall motion abnormalities or reduction in LVEF are noted on TEE, he will need invasive coronary evaluation and depending on the severity of the findings we may have to transition back to heparin to allow coronary angiography and revascularization. If there are no major regional wall motion abnormalities on the TEE, would recommend outpatient evaluation for coronary insufficiency with a cardiac PET scan and only plan invasive evaluation if perfusion abnormalities are seen (and defer defer angiography until 4 weeks after cardioversion). Discussed the risks and benefits of TEE and cardioversion and sedation with the patient, his wife and his mother who is a retired publishing rights manager.  He understand and agrees to proceed with the current plan. Also need to review the efficacy of his CPAP  therapy, as an outpatient. He has a history of anemia, recent upper and lower endoscopy has not identified any  serious bleeding problems.  No esophageal abnormalities on endoscopy 08/04/2024  Informed Consent   Shared Decision Making/Informed Consent   The risks [stroke, cardiac arrhythmias rarely resulting in the need for a temporary or permanent pacemaker, skin irritation or burns, esophageal damage, perforation (1:10,000 risk), bleeding, pharyngeal hematoma as well as other potential complications associated with conscious sedation including aspiration, arrhythmia, respiratory failure and death], benefits (treatment guidance, restoration of normal sinus rhythm, diagnostic support) and alternatives of a transesophageal echocardiogram guided cardioversion were discussed in detail with Mr. Applegate and he is willing to proceed.      Jerel Balding, MD, FACC [facility] [phone] 10/06/2024, 7:00 PM

## 2024-10-06 NOTE — Progress Notes (Signed)
 ANTICOAGULATION CONSULT NOTE  Pharmacy Consult for Heparin Indication: chest pain/ACS  No Known Allergies  Patient Measurements: Height: 6' 3 (190.5 cm) Weight: (!) 156.5 kg (345 lb) IBW/kg (Calculated) : 84.5 Heparin Dosing Weight: 120.9  Vital Signs: Temp: 97.6 F (36.4 C) (10/30 1022) Temp Source: Oral (10/30 1022) BP: 91/65 (10/30 1400) Pulse Rate: 79 (10/30 1400)  Labs: Recent Labs    10/06/24 1037 10/06/24 1051 10/06/24 1242  HGB 12.5* 12.6*  --   HCT 38.6* 37.0*  --   PLT 201  --   --   CREATININE 0.92 1.00  --   TROPONINIHS 3  --  21*    Estimated Creatinine Clearance: 140.1 mL/min (by C-G formula based on SCr of 1 mg/dL).   Medical History: Past Medical History:  Diagnosis Date   Acute bronchitis 05/09/2013   Allergy    Bronchitis, acute 12/10/2011   Candidal skin infection 05/05/2017   Chicken pox as a child   Constipation 02/20/2017   Coronary atherosclerosis of native coronary artery    a. NSTEMI 04/20/11: tx with Promus DES to LAD; bifurcation lesion with oDx 90% tx with POBA; b. staged PCI of right post. AV Branch with Promus DES; c. 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 Patient is presenting with chest pain. Heparin per pharmacy consult placed for chest pain/ACS.  Patient is not on anticoagulation prior to arrival.  Hgb 12.6; plt 201  Goal of Therapy:  Heparin level 0.3-0.7 units/ml Monitor platelets by anticoagulation protocol: Yes   Plan:  Give IV heparin 4000 units bolus x 1 Start heparin infusion at 1500 units/hr Check anti-Xa level in 6 hours and daily while on heparin Continue to monitor H&H and platelets  Dorn Buttner, PharmD, BCPS 10/06/2024 2:20 PM ED Clinical Pharmacist -  [phone]

## 2024-10-06 NOTE — ED Provider Notes (Signed)
 [place] EMERGENCY DEPARTMENT AT [facility] Provider Note   CSN: [id number] Arrival date & time: 10/06/24  1009     Patient presents with: Chest Pain   Keith Morrow is a 53 y.o. male.   53 yo M with a chief complaints of chest pain.  He tells me has had symptoms off and on for this past week.  He thought maybe it was indigestion pain to the center of his chest radiates to both his shoulders and to his ears.  Has had a heart attack before and thought it felt somewhat similar but did seem to improve with Tums.  He was at work today and had sudden worsening.  Decided to come into the Emergency Department for evaluation.  With EMS he was noted to be in atrial fibrillation with rapid ventricular response.  Heart rates have improved en route.   Chest Pain      Prior to Admission medications   Medication Sig Start Date End Date Taking? Authorizing Provider  aspirin  EC 81 MG tablet Take 81 mg by mouth daily.   Yes [provider]  atorvastatin  (LIPITOR) 40 MG tablet TAKE 1 TABLET DAILY Patient taking differently: Take 40 mg by mouth at bedtime. 04/18/24  Yes Wonda Sharper, MD  BACLOFEN  IT 1 Dose by Intrathecal route as directed. Lioresal  1820mcg/459.9mcg daily, Clonidine 471mcg/109.65mcg daily/hydromorphone  427mcg/104.4mcg daily/baclofen  459.80mcg/29.9mcg daily   Yes [provider]  busPIRone  (BUSPAR ) 10 MG tablet TAKE 1 TABLET IN THE MORNING, AT NOON, IN THE EVENING AND AT BEDTIME Patient taking differently: Take 10 mg by mouth 4 (four) times daily - after meals and at bedtime. 04/18/24  Yes Domenica Harlene LABOR, MD  cetirizine (ZYRTEC) 10 MG tablet Take 10 mg by mouth daily as needed for allergies.   Yes [provider]  clonazePAM  (KLONOPIN ) 1 MG tablet Take 1 tablet (1 mg total) by mouth 4 (four) times daily. 03/12/23  Yes Sater, Charlie LABOR, MD  cyclobenzaprine  (FLEXERIL ) 10 MG tablet TAKE 1 TABLET BY MOUTH THREE TIMES A DAY AS NEEDED FOR MUSCLE  SPASMS Patient taking differently: Take 10 mg by mouth at bedtime. 06/08/23  Yes Sater, Charlie LABOR, MD  CYMBALTA  60 MG capsule Take 1 capsule (60 mg total) by mouth 2 (two) times daily. 08/18/24  Yes Domenica Harlene LABOR, MD  dalfampridine  10 MG TB12 Take 1 tablet (10 mg total) by mouth every 12 (twelve) hours. 08/18/24  Yes Sater, Charlie LABOR, MD  DULoxetine  (CYMBALTA ) 60 MG capsule Take 60 mg by mouth 2 (two) times daily.   Yes [provider]  furosemide  (LASIX ) 20 MG tablet TAKE 1 TABLET (20 MG TOTAL) BY MOUTH DAILY X 5 DAYS, THEN AS NEEDED FOR FOR SHORTNESS OF BREATH/WEIGHT GAIN>3#, PEDAL EDEMA AND CAN TAKE A SECOND TAB DAILY AS NEEDED Patient taking differently: Take 20 mg by mouth daily as needed for fluid or edema. Take 20 mg by mouth in the morning and an additional 20 mg once a day as needed for shortness of breath or a weight gain of 3 pounds/pedal edema 07/20/24  Yes Domenica Harlene LABOR, MD  gabapentin  (NEURONTIN ) 600 MG tablet Take 1 tablet (600 mg total) by mouth 4 (four) times daily. 03/29/24  Yes Sater, Charlie LABOR, MD  ibuprofen (ADVIL) 200 MG tablet Take 200 mg by mouth every 6 (six) hours as needed for moderate pain (pain score 4-6).   Yes [provider]  ketoconazole  (NIZORAL ) 2 % shampoo APPLY TOPICALLY 2 TIMES A WEEK  07/27/23  Yes Domenica Harlene LABOR, MD  methylphenidate  (RITALIN ) 10 MG tablet Take 1 tablet (10 mg total) by mouth 4 (four) times daily. 08/09/24  Yes Sater, Charlie LABOR, MD  natalizumab (TYSABRI) 300 MG/15ML injection Inject 300 mg into the vein every 28 (twenty-eight) days.    Yes [provider]  nitroGLYCERIN  (NITROSTAT ) 0.4 MG SL tablet PLACE 1 TABLET UNDER THE TOUNGE EVERY 5 MINUTES AS NEEDED FOR CHEST PAIN (X 3 DOSES) 04/28/23  Yes Wonda Sharper, MD  nystatin  cream (MYCOSTATIN ) Apply 1 application  topically as directed. 05/20/22  Yes Domenica Harlene LABOR, MD  olmesartan -hydrochlorothiazide (BENICAR  HCT) 20-12.5 MG tablet TAKE 1 TABLET DAILY 04/18/24  Yes Domenica Harlene LABOR, MD  pantoprazole  (PROTONIX ) 40 MG tablet TAKE 1 TABLET BY MOUTH TWICE A DAY 04/05/24  Yes Danis, Victory CROME III, MD  potassium chloride  SA (KLOR-CON  M20) 20 MEQ tablet TAKE 1 TABLET BY MOUTH EVERY DAY AS NEEDED WHEN TAKING A SECOND LASIX  TAB ANY GIVEN DAY Patient taking differently: Take 20 mEq by mouth once. Take 20 mEq by mouth once a day when a second dose of Furosemide  has been taken 04/02/21  Yes Parthenia Olivia HERO, PA-C  sucralfate  (CARAFATE ) 1 g tablet TAKE 1 TABLET FOUR TIMES A DAY 10/14/23  Yes Domenica Harlene LABOR, MD  SYNTHROID  137 MCG tablet Take 137 mcg by mouth daily before breakfast.   Yes [provider]  tamsulosin  (FLOMAX ) 0.4 MG CAPS capsule TAKE 1 CAPSULE DAILY 03/21/24  Yes Domenica Harlene LABOR, MD    Allergies: Patient has no known allergies.    Review of Systems  Cardiovascular:  Positive for chest pain.    Updated Vital Signs BP 103/65   Pulse 80   Temp 97.6 F (36.4 C) (Oral)   Resp (!) 9   Ht 6' 3 (1.905 m)   Wt (!) 156.5 kg   SpO2 100%   BMI 43.12 kg/m   Physical Exam Vitals and nursing note reviewed.  Constitutional:      Appearance: He is well-developed.  HENT:     Head: Normocephalic and atraumatic.  Eyes:     Pupils: Pupils are equal, round, and reactive to light.  Neck:     Vascular: No JVD.  Cardiovascular:     Rate and Rhythm: Normal rate and regular rhythm.     Heart sounds: No murmur heard.    No friction rub. No gallop.  Pulmonary:     Effort: No respiratory distress.     Breath sounds: No wheezing.  Chest:     Chest wall: No tenderness.  Abdominal:     General: There is no distension.     Tenderness: There is no abdominal tenderness. There is no guarding or rebound.  Musculoskeletal:        General: Normal range of motion.     Cervical back: Normal range of motion and neck supple.  Skin:    Coloration: Skin is not pale.     Findings: No rash.  Neurological:     Mental Status: He is alert and oriented to person, place, and  time.  Psychiatric:        Behavior: Behavior normal.     (all labs ordered are listed, but only abnormal results are displayed) Labs Reviewed  BASIC METABOLIC PANEL WITH GFR - Abnormal; Notable for the following components:      Result Value   Potassium 3.3 (*)    Glucose, Bld 133 (*)    Calcium  8.7 (*)  All other components within normal limits  CBC - Abnormal; Notable for the following components:   Hemoglobin 12.5 (*)    HCT 38.6 (*)    All other components within normal limits  HEPATIC FUNCTION PANEL - Abnormal; Notable for the following components:   Total Protein 6.3 (*)    All other components within normal limits  I-STAT CHEM 8, ED - Abnormal; Notable for the following components:   Potassium 3.2 (*)    Glucose, Bld 129 (*)    Calcium , Ion 1.08 (*)    Hemoglobin 12.6 (*)    HCT 37.0 (*)    All other components within normal limits  TROPONIN I (HIGH SENSITIVITY) - Abnormal; Notable for the following components:   Troponin I (High Sensitivity) 21 (*)    All other components within normal limits  LIPASE, BLOOD  MAGNESIUM  HEPARIN LEVEL (UNFRACTIONATED)  HEMOGLOBIN A1C  BRAIN NATRIURETIC PEPTIDE  CBG MONITORING, ED  TROPONIN I (HIGH SENSITIVITY)    EKG: EKG Interpretation Date/Time:  Thursday October 06 2024 10:16:56 EDT Ventricular Rate:  91 PR Interval:  128 QRS Duration:  98 QT Interval:  325 QTC Calculation: 400 R Axis:   72  Text Interpretation: Atrial fibrillation Abnormal R-wave progression, early transition Nonspecific repol abnormality, diffuse leads Otherwise no significant change Confirmed by Emil Share [phone]) on 10/06/2024 11:46:57 AM  Radiology: DG Chest 1 View Result Date: 10/06/2024 EXAM: 1 VIEW(S) XRAY OF THE CHEST 10/06/2024 10:51:00 AM COMPARISON: 04/21/2023 CLINICAL HISTORY: Chest pain FINDINGS: LINES, TUBES AND DEVICES: Overlying monitor wires. LUNGS AND PLEURA: No focal pulmonary opacity. No pulmonary edema. No pleural effusion. No  pneumothorax. HEART AND MEDIASTINUM: No acute abnormality of the cardiac and mediastinal silhouettes. BONES AND SOFT TISSUES: Thoracic spondylosis. IMPRESSION: 1. No acute cardiopulmonary pathology. Electronically signed by: Franky Stanford MD 10/06/2024 11:14 AM EDT RP Workstation: [id number]     .Critical Care  Performed by: Emil Share, DO Authorized by: Emil Share, DO   Critical care provider statement:    Critical care time (minutes):  35   Critical care time was exclusive of:  Separately billable procedures and treating other patients   Critical care was time spent personally by me on the following activities:  Development of treatment plan with patient or surrogate, discussions with consultants, evaluation of patient's response to treatment, examination of patient, ordering and review of laboratory studies, ordering and review of radiographic studies, ordering and performing treatments and interventions, pulse oximetry, re-evaluation of patient's condition and review of old charts   Care discussed with: admitting provider      Medications Ordered in the ED  aspirin  chewable tablet 324 mg (324 mg Oral Not Given 10/06/24 1050)  heparin ADULT infusion 100 units/mL (25000 units/250mL) (1,500 Units/hr Intravenous New Bag/Given 10/06/24 1514)  metoprolol  tartrate (LOPRESSOR ) tablet 25 mg (25 mg Oral Given 10/06/24 1244)  sodium chloride  0.9 % bolus 1,000 mL (1,000 mLs Intravenous New Bag/Given 10/06/24 1247)  heparin bolus via infusion 4,000 Units (4,000 Units Intravenous Bolus from Bag 10/06/24 1514)                                    Medical Decision Making Amount and/or Complexity of Data Reviewed Labs: ordered. Radiology: ordered.  Risk OTC drugs. Prescription drug management.   53 yo M with a chief complaint of chest pain.  He has been having symptoms all week he felt more like indigestion but  had worsening while he was at work today.  Pain into the chest radiates to both of his  shoulders goes up into his ears.  Felt this was similar to when he had had a heart attack in the past.  Will obtain a chest x-ray blood work.  Will discuss with cardiology.  Initial troponins negative.  Delta with increase still barely positive.  Will discuss with cardiology.  I discussed case with Dr. Okey, cardiology recommends medical admission with cardiology consult.  Recommended heparin.  The patients results and plan were reviewed and discussed.   Any x-rays performed were independently reviewed by myself.   Differential diagnosis were considered with the presenting HPI.  Medications  aspirin  chewable tablet 324 mg (324 mg Oral Not Given 10/06/24 1050)  heparin ADULT infusion 100 units/mL (25000 units/250mL) (1,500 Units/hr Intravenous New Bag/Given 10/06/24 1514)  metoprolol  tartrate (LOPRESSOR ) tablet 25 mg (25 mg Oral Given 10/06/24 1244)  sodium chloride  0.9 % bolus 1,000 mL (1,000 mLs Intravenous New Bag/Given 10/06/24 1247)  heparin bolus via infusion 4,000 Units (4,000 Units Intravenous Bolus from Bag 10/06/24 1514)    Vitals:   10/06/24 1312 10/06/24 1345 10/06/24 1400 10/06/24 1515  BP:  (!) 88/56 91/65 103/65  Pulse: 84 97 79 80  Resp: 15 18 (!) 21 (!) 9  Temp:      TempSrc:      SpO2: 100% 100% 100% 100%  Weight:      Height:        Final diagnoses:  Chest pain with high risk for cardiac etiology  Atrial fibrillation with rapid ventricular response (HCC)    Admission/ observation were discussed with the admitting physician, patient and/or family and they are comfortable with the plan.       Final diagnoses:  Chest pain with high risk for cardiac etiology  Atrial fibrillation with rapid ventricular response [facility])    ED Discharge Orders     None          Emil Share, DO 10/06/24 1525

## 2024-10-06 NOTE — Progress Notes (Signed)
 [facility] night cross cover note:   Per pt's request, I have placed order with respiratory therapist for resumption of his home nocturnal CPAP in setting of history of OSA.     Eva Pore, DO Hospitalist

## 2024-10-07 ENCOUNTER — Inpatient Hospital Stay (HOSPITAL_COMMUNITY)

## 2024-10-07 ENCOUNTER — Encounter (HOSPITAL_COMMUNITY): Payer: Self-pay | Admitting: Anesthesiology

## 2024-10-07 ENCOUNTER — Other Ambulatory Visit: Payer: Self-pay

## 2024-10-07 ENCOUNTER — Other Ambulatory Visit (HOSPITAL_COMMUNITY): Payer: Self-pay

## 2024-10-07 ENCOUNTER — Telehealth (HOSPITAL_COMMUNITY): Payer: Self-pay

## 2024-10-07 DIAGNOSIS — E785 Hyperlipidemia, unspecified: Secondary | ICD-10-CM | POA: Diagnosis not present

## 2024-10-07 DIAGNOSIS — R0789 Other chest pain: Secondary | ICD-10-CM | POA: Diagnosis not present

## 2024-10-07 DIAGNOSIS — I480 Paroxysmal atrial fibrillation: Secondary | ICD-10-CM | POA: Diagnosis not present

## 2024-10-07 DIAGNOSIS — R079 Chest pain, unspecified: Secondary | ICD-10-CM | POA: Diagnosis not present

## 2024-10-07 DIAGNOSIS — I25118 Atherosclerotic heart disease of native coronary artery with other forms of angina pectoris: Secondary | ICD-10-CM | POA: Diagnosis not present

## 2024-10-07 DIAGNOSIS — I4891 Unspecified atrial fibrillation: Secondary | ICD-10-CM | POA: Diagnosis not present

## 2024-10-07 DIAGNOSIS — I2510 Atherosclerotic heart disease of native coronary artery without angina pectoris: Secondary | ICD-10-CM | POA: Diagnosis not present

## 2024-10-07 LAB — ECHOCARDIOGRAM COMPLETE
Area-P 1/2: 4.17 cm2
Height: 75 in
S' Lateral: 3.5 cm
Weight: 5520 [oz_av]

## 2024-10-07 LAB — CBC
HCT: 33.9 % — ABNORMAL LOW (ref 39.0–52.0)
Hemoglobin: 11.3 g/dL — ABNORMAL LOW (ref 13.0–17.0)
MCH: 29.7 pg (ref 26.0–34.0)
MCHC: 33.3 g/dL (ref 30.0–36.0)
MCV: 89 fL (ref 80.0–100.0)
Platelets: 186 K/uL (ref 150–400)
RBC: 3.81 MIL/uL — ABNORMAL LOW (ref 4.22–5.81)
RDW: 14.7 % (ref 11.5–15.5)
WBC: 7.1 K/uL (ref 4.0–10.5)
nRBC: 0.3 % — ABNORMAL HIGH (ref 0.0–0.2)

## 2024-10-07 LAB — BASIC METABOLIC PANEL WITH GFR
Anion gap: 11 (ref 5–15)
BUN: 17 mg/dL (ref 6–20)
CO2: 26 mmol/L (ref 22–32)
Calcium: 8.5 mg/dL — ABNORMAL LOW (ref 8.9–10.3)
Chloride: 103 mmol/L (ref 98–111)
Creatinine, Ser: 0.97 mg/dL (ref 0.61–1.24)
GFR, Estimated: >60 mL/min (ref >60)
Glucose, Bld: 97 mg/dL (ref 70–99)
Potassium: 4.2 mmol/L (ref 3.5–5.1)
Sodium: 140 mmol/L (ref 135–145)

## 2024-10-07 LAB — MAGNESIUM: Magnesium: 2.1 mg/dL (ref 1.7–2.4)

## 2024-10-07 LAB — TSH: TSH: 1.797 u[IU]/mL (ref 0.350–4.500)

## 2024-10-07 SURGERY — TRANSESOPHAGEAL ECHOCARDIOGRAM (TEE) (CATHLAB)
Anesthesia: Monitor Anesthesia Care

## 2024-10-07 MED ORDER — METOPROLOL TARTRATE 25 MG PO TABS
12.5000 mg | ORAL_TABLET | Freq: Two times a day (BID) | ORAL | 0 refills | Status: DC
  Filled 2024-10-07: qty 30, 30d supply, fill #0

## 2024-10-07 MED ORDER — APIXABAN 5 MG PO TABS
5.0000 mg | ORAL_TABLET | Freq: Two times a day (BID) | ORAL | 0 refills | Status: DC
  Filled 2024-10-07: qty 60, 30d supply, fill #0

## 2024-10-07 MED ORDER — PERFLUTREN LIPID MICROSPHERE
1.0000 mL | INTRAVENOUS | Status: AC | PRN
  Administered 2024-10-07: 2 mL via INTRAVENOUS

## 2024-10-07 MED ORDER — LACTATED RINGERS IV BOLUS
500.0000 mL | Freq: Once | INTRAVENOUS | Status: AC
  Administered 2024-10-07: 500 mL via INTRAVENOUS

## 2024-10-07 MED ORDER — OLMESARTAN MEDOXOMIL 20 MG PO TABS
40.0000 mg | ORAL_TABLET | Freq: Every day | ORAL | 0 refills | Status: DC
  Filled 2024-10-07: qty 30, 15d supply, fill #0

## 2024-10-07 NOTE — Progress Notes (Signed)
 PROGRESS NOTE Keith Morrow  FMW:[id number] DOB: 1971-11-21 DOA: 10/06/2024 PCP: Domenica Harlene LABOR, MD  Brief Narrative/Hospital Course: Keith Morrow is a 53 y.o. 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 In the ED noted to be A-fib with RVR by EMS underwent further workup including chest x-ray labs EKG CT angio chest-although UNREMARKABLE except for minimal patchy airspace and ground glass opacities in the dependent lower lobe likely atelectasis or mild aspiration/inflammation, coronary artery calcification, troponin elevated 02-26-56-56, cardiology was consulted and admitted for chest pain A-fib RVR  Subjective: Seen and examined today Still having chest pain whenever HR goes up Family at bedside. He is on RA Overnight afebrile BP 86-100, on room air afebrile, labs reviewed stable BMP and CBC Patient was given IV fluid bolus 500 cc. For soft Blood pressure in night  Assessment and plan:  Chest pain CAD status post stent to LAD and RCA in 2012 Hyperlipidemia Elevated troponin 02-26-56-56: Appreciate cardiology input, troponin slight elevated but rather flat,now on Eliquis aspirin  stopped on Atorvastatin  plan is for outpatient nuclear stress test-if is TEE showed stable EF and wall motion.  New onset A-fib with RVR: Admitted on heparin transitioned to Eliquis, started on metoprolol -continue as blood pressure tolerates.TSH normal. Scheduled for TEE cardioversion 10/31.Continue plan as per cardiology Metoprolol  held due to hypotension.  Multiple sclerosis with spasticity chronic pain, wheelchair-bound Depression with anxiety Mood stable.he is wheelchair-bound.  Continue home gabapentin  Cymbalta  BuSpar  dalfampridine    Hyperlipidemia Continue  statin  Hypothyroidism Continue Synthroid , TSH stable  HTN BP soft.  Metoprolol  hold  GERD Continue PPI  Chronic constipation Continue bowel regimen  Iron deficiency anemia: Recently had EGD colonoscopy done hemoglobin stable.  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 Patient status is: Remains hospitalized because of severity of illness Level of care: Telemetry   Dispo: The patient is from: home            Anticipated disposition: TBD Objective: Vitals last 24 hrs: Vitals:   10/06/24 2114 10/06/24 2332 10/07/24 0417 10/07/24 0700  BP: 100/73 95/66 (!) 86/63 (!) 97/54  Pulse: 86 85 87 80  Resp: 19 18 19 19   Temp: 97.6 F (36.4 C) 97.9 F (36.6 C)  97.9 F (36.6 C)  TempSrc: Oral Oral  Oral  SpO2: 100% 97% 97% 95%  Weight:      Height:        Physical Examination: General exam: alert awake, oriented, older than stated age HEENT:Oral mucosa moist, Ear/Nose WNL grossly Respiratory system: Bilaterally clear BS,no use of accessory muscle Cardiovascular system: S1 & S2 +, No JVD. Gastrointestinal system: Abdomen soft,NT,ND, BS++ Nervous System: Alert, awake, moving all extremities,and following commands. Extremities: extremities warm, leg edema neg Skin: Warm, no rashes MSK: Normal muscle bulk,tone, power   Medications reviewed:  Scheduled Meds:  apixaban  5 mg Oral BID   busPIRone   10 mg Oral TID PC & HS   clonazePAM   1 mg Oral QID   dalfampridine   10 mg Oral Q12H   DULoxetine   60 mg Oral BID   gabapentin   600 mg Oral QID   levothyroxine   137 mcg Oral Q0600   metoprolol  tartrate  12.5 mg Oral Q6H   pantoprazole   40 mg Oral BID   tamsulosin    0.4 mg Oral Daily   Continuous Infusions:  sodium chloride  20 mL/hr at 10/07/24 9082   Diet: Diet Order             Diet NPO time specified Except for: Sips with Meds  Diet effective midnight                    Data Reviewed: I have personally reviewed following labs and imaging studies ( see epic result tab) CBC: Recent Labs  Lab 10/06/24 1037 10/06/24 1051 10/07/24 0628  WBC 8.1  --  7.1  HGB 12.5* 12.6* 11.3*  HCT 38.6* 37.0* 33.9*  MCV 91.3  --  89.0  PLT 201  --  186   CMP: Recent Labs  Lab 10/06/24 1037 10/06/24 1051 10/07/24 0628  NA 140 141 140  K 3.3* 3.2* 4.2  CL 104 102 103  CO2 25  --  26  GLUCOSE 133* 129* 97  BUN 18 19 17   CREATININE 0.92 1.00 0.97  CALCIUM  8.7*  --  8.5*  MG 2.0  --  2.1   GFR: Estimated Creatinine Clearance: 144.4 mL/min (by C-G formula based on SCr of 0.97 mg/dL). Recent Labs  Lab 10/06/24 1037  AST 17  ALT 19  ALKPHOS 80  BILITOT 0.7  PROT 6.3*  ALBUMIN 3.6    Recent Labs  Lab 10/06/24 1037  LIPASE 35   No results for input(s): AMMONIA in the last 168 hours. Coagulation Profile: No results for input(s): INR, PROTIME in the last 168 hours. Unresulted Labs (From admission, onward)    None      Antimicrobials/Microbiology: Anti-infectives (From admission, onward)    None      No results found for: SDES, SPECREQUEST, CULT, REPTSTATUS  Procedures: Procedure(s) (LRB): TRANSESOPHAGEAL ECHOCARDIOGRAM (N/A) CARDIOVERSION (N/A)   Mennie LAMY, MD [facility] 10/07/2024, 10:53 AM

## 2024-10-07 NOTE — Hospital Course (Addendum)
 Keith Morrow is a 53 y.o. 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 In the ED noted to be A-fib with RVR by EMS underwent further workup including chest x-ray labs EKG CT angio chest-although UNREMARKABLE except for minimal patchy airspace and ground glass opacities in the dependent lower lobe likely atelectasis or mild aspiration/inflammation, coronary artery calcification, troponin elevated 02-26-56-56, cardiology was consulted and admitted for chest pain A-fib RVR Patient subsequently converted to normal sinus rhythm TEE cardioversion was canceled.  Once he was normal sinus rhythm chest pain has resolved.  At this time patient is cleared for discharge by cardiology to continue on Eliquis 5 twice daily metoprolol  12.5 twice daily olmesartan  20 daily and to discontinue home losartan HCT, DC aspirin  continue home atorvastatin  and Lasix  and cardiology as an outpatient follow-up  Subjective: Seen and examined today  Discharge Diagnoses:   Chest pain CAD status post stent to LAD and RCA in 2012 Hyperlipidemia Elevated troponin 02-26-56-56: Appreciate cardiology input, troponin slight elevated but rather flat,now on Eliquis aspirin  stopped on Atorvastatin  plan is for outpatient nuclear stress test  New onset A-fib with RVR: Admitted on heparin transitioned to Eliquis, started on metoprolol -continue as blood pressure tolerates.TSH normal. Scheduled for TEE cardioversion 10/31.Continue plan as per cardiology Patient subsequently converted to normal sinus rhythm TEE cardioversion was canceled.  Once he was normal sinus rhythm chest pain has resolved.   Multiple sclerosis with spasticity chronic pain, wheelchair-bound Depression with anxiety Mood stable.he  is wheelchair-bound.  Continue home gabapentin  Cymbalta  BuSpar  dalfampridine    Hyperlipidemia Continue statin  Hypothyroidism Continue Synthroid , TSH stable  HTN BP soft.  Metoprolol  hold  GERD Continue PPI  Chronic constipation Continue bowel regimen  Iron deficiency anemia: Recently had EGD colonoscopy done hemoglobin stable.  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 Patient status is: Remains hospitalized because of severity of illness Level of care: Telemetry   Dispo: The patient is from: home            Anticipated disposition:home Objective: Vitals last 24 hrs: Vitals:   10/06/24 2114 10/06/24 2332 10/07/24 0417 10/07/24 0700  BP: 100/73 95/66 (!) 86/63 (!) 97/54  Pulse: 86 85 87 80  Resp: 19 18 19 19   Temp: 97.6 F (36.4 C) 97.9 F (36.6 C)  97.9 F (36.6 C)  TempSrc: Oral Oral  Oral  SpO2: 100% 97% 97% 95%  Weight:      Height:        Physical Examination: General exam: alert awake, oriented, older than stated age HEENT:Oral mucosa moist, Ear/Nose WNL grossly Respiratory system: Bilaterally clear BS,no use of accessory muscle Cardiovascular system: S1 & S2 +, No JVD. Gastrointestinal system: Abdomen soft,NT,ND, BS++ Nervous System: Alert, awake, moving all extremities,and following commands. Extremities: extremities warm, leg edema neg Skin: Warm, no rashes MSK: Normal muscle bulk,tone, power

## 2024-10-07 NOTE — Progress Notes (Signed)
 Reviewed AVS, patient expressed understanding of medications, MD follow up reviewed.   Removed IV, Site clean, dry and intact.  See LDA for information on wounds at discharge. CCMD contacted and informed patients is being discharged.  Patient states all belongings brought to the hospital at time of admission are accounted for and packed to take home.  Picked up medications from [facility] pharmacy. [facility] Transport contacted to transport patient to entrance A, where family member was waiting in vehicle to transport home.

## 2024-10-07 NOTE — Progress Notes (Signed)
 Dr. Marcene was made aware that the pt's left arm BP=80/51, 82/50. Right arm BP=86/63. HR=88-106. Pt says he has mild SOB. No change in CP since adm. Skin warm and dry. Pt denied dizziness and lightheadedness and palpitation.

## 2024-10-07 NOTE — Plan of Care (Signed)

## 2024-10-07 NOTE — Progress Notes (Signed)
 Day nurse to follow up with BP. Dr. Marcene was made aware that last night and this morning metoprolol  was held because of low BP.

## 2024-10-07 NOTE — Progress Notes (Addendum)
  Progress Note  Patient Name: Keith Morrow Date of Encounter: 10/07/2024 [facility] Cardiologist: Ozell Fell, MD   Interval Summary   Remains in A-fib with RVR.  Heart rate in the 80s overnight, but promptly increases to 120s when alert and tries to reposition himself in bed.  This is immediately associated a worsening of his retrosternal chest pressure. After the original dose of metoprolol  25 mg yesterday at noon yesterday and a second dose of 12.5 mg at 1800 hrs., unable to take any medications for ventricular rate control due to low blood pressure. Troponin has peaked at 57.  Vital Signs Vitals:   10/06/24 2114 10/06/24 2332 10/07/24 0417 10/07/24 0700  BP: 100/73 95/66 (!) 86/63 (!) 97/54  Pulse: 86 85 87 80  Resp: 19 18 19 19   Temp: 97.6 F (36.4 C) 97.9 F (36.6 C)  97.9 F (36.6 C)  TempSrc: Oral Oral  Oral  SpO2: 100% 97% 97% 95%  Weight:      Height:        Intake/Output Summary (Last 24 hours) at 10/07/2024 0832 Last data filed at 10/06/2024 2332 Gross per 24 hour  Intake 1001.34 ml  Output 1125 ml  Net -123.66 ml      10/06/2024   10:24 AM 07/27/2024    2:57 PM 06/06/2024    2:22 PM  Last 3 Weights  Weight (lbs) 345 lb 328 lb 328 lb 9.6 oz  Weight (kg) 156.491 kg 148.78 kg 149.052 kg      Telemetry/ECG  AFib w RVR - Personally Reviewed  Physical Exam  GEN: No acute distress.  Obese Neck: No JVD Cardiac: Irregular, no murmurs, rubs, or gallops.  Respiratory: Clear to auscultation bilaterally. GI: Soft, nontender, non-distended  MS: No edema  Assessment & Plan   Symptomatic atrial fibrillation rapid ventricular response, difficult to rate control due to baseline low blood pressure.  Plan TEE cardioversion today.  If he has change in LV systolic function or extensive regional wall motion abnormalities, I think we should keep him in the hospital, transition back to intravenous heparin, with plan for coronary angiography early next  week.  If after cardioversion his symptoms abate and he does not have changes in LV regional wall motion, we will plan additional evaluation with outpatient nuclear perfusion imaging (preferably cardiac PET) before deciding on invasive angiography, preferably to be performed 4 weeks after cardioversion.  Informed Consent   Shared Decision Making/Informed Consent   The risks [stroke, cardiac arrhythmias rarely resulting in the need for a temporary or permanent pacemaker, skin irritation or burns, esophageal damage, perforation (1:10,000 risk), bleeding, pharyngeal hematoma as well as other potential complications associated with conscious sedation including aspiration, arrhythmia, respiratory failure and death], benefits (treatment guidance, restoration of normal sinus rhythm, diagnostic support) and alternatives of a transesophageal echocardiogram guided cardioversion were discussed in detail with Keith Morrow and he is willing to proceed.       For questions or updates, please contact [facility] Please consult [email or web address] for contact info under         Signed, Jerel Balding, MD

## 2024-10-07 NOTE — Discharge Summary (Signed)
 Physician Discharge Summary  Keith Morrow FMW:[id number] DOB: [birth year] DOA: 10/06/2024  PCP: Domenica Harlene LABOR, MD  Admit date: 10/06/2024 Discharge date: 10/07/2024 Recommendations for Outpatient Follow-up:  Follow up with PCP in 1 weeks-call for appointment Please obtain BMP/CBC in one week Follow-up with cardiology as outpatient  Discharge Dispo: home Discharge Condition: Stable Code Status:   Code Status: Full Code Diet recommendation:  Diet Order             Diet Heart Room service appropriate? Yes; Fluid consistency: Thin  Diet effective now           Diet - low sodium heart healthy                    Brief/Interim Summary: Keith Morrow is a 53 y.o. 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 In the ED noted to be A-fib with RVR by EMS underwent further workup including chest x-ray labs EKG CT angio chest-although UNREMARKABLE except for minimal patchy airspace and ground glass opacities in the dependent lower lobe likely atelectasis or mild aspiration/inflammation, coronary artery calcification, troponin elevated 02-26-56-56, cardiology was consulted and admitted for chest pain A-fib RVR Patient subsequently converted to normal sinus rhythm TEE cardioversion was canceled.  Once he was normal sinus rhythm chest pain has resolved.  At this time patient is cleared for discharge by cardiology to continue on Eliquis 5 twice daily metoprolol  12.5 twice daily olmesartan  20 daily and to discontinue home losartan HCT, DC aspirin  continue home atorvastatin  and Lasix  and cardiology as an outpatient follow-up  Subjective: Seen and examined today  Discharge Diagnoses:   Chest pain CAD status post stent to LAD and RCA in  2012 Hyperlipidemia Elevated troponin 02-26-56-56: Appreciate cardiology input, troponin slight elevated but rather flat,now on Eliquis aspirin  stopped on Atorvastatin  plan is for outpatient nuclear stress test  New onset A-fib with RVR: Admitted on heparin transitioned to Eliquis, started on metoprolol -continue as blood pressure tolerates.TSH normal. Scheduled for TEE cardioversion 10/31.Continue plan as per cardiology Patient subsequently converted to normal sinus rhythm TEE cardioversion was canceled.  Once he was normal sinus rhythm chest pain has resolved.   Multiple sclerosis with spasticity chronic pain, wheelchair-bound Depression with anxiety Mood stable.he is wheelchair-bound.  Continue home gabapentin  Cymbalta  BuSpar  dalfampridine    Hyperlipidemia Continue statin  Hypothyroidism Continue Synthroid , TSH stable  HTN BP soft.  Metoprolol  hold  GERD Continue PPI  Chronic constipation Continue bowel regimen  Iron deficiency anemia: Recently had EGD colonoscopy done hemoglobin stable.  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 Patient status is: Remains hospitalized because of severity of illness Level of care: Telemetry   Dispo: The patient is from: home            Anticipated disposition:home Objective: Vitals last 24 hrs: Vitals:   10/06/24 2114 10/06/24 2332 10/07/24 0417 10/07/24 0700  BP: 100/73 95/66 (!) 86/63 (!) 97/54  Pulse: 86 85 87 80  Resp: 19 18 19 19   Temp: 97.6 F (36.4  C) 97.9 F (36.6 C)  97.9 F (36.6 C)  TempSrc: Oral Oral  Oral  SpO2: 100% 97% 97% 95%  Weight:      Height:        Physical Examination: General exam:  alert awake, oriented, older than stated age HEENT:Oral mucosa moist, Ear/Nose WNL grossly Respiratory system: Bilaterally clear BS,no use of accessory muscle Cardiovascular system: S1 & S2 +, No JVD. Gastrointestinal system: Abdomen soft,NT,ND, BS++ Nervous System: Alert, awake, moving all extremities,and following commands. Extremities: extremities warm, leg edema neg Skin: Warm, no rashes MSK: Normal muscle bulk,tone, power     Procedure(s) (LRB): TRANSESOPHAGEAL ECHOCARDIOGRAM (N/A) CARDIOVERSION (N/A)  Consultation: See note.  Discharge Instructions  Discharge Instructions     Diet - low sodium heart healthy   Complete by: As directed    Discharge instructions   Complete by: As directed    Please call call MD or return to ER for similar or worsening recurring problem that brought you to hospital or if any fever,nausea/vomiting,abdominal pain, uncontrolled pain, chest pain,  shortness of breath or any other alarming symptoms.  Please follow-up your doctor as instructed in a week time and call the office for appointment.  Please avoid alcohol, smoking, or any other illicit substance and maintain healthy habits including taking your regular medications as prescribed.  You were cared for by a hospitalist during your hospital stay. If you have any questions about your discharge medications or the care you received while you were in the hospital after you are discharged, you can call the unit and ask to speak with the hospitalist on call if the hospitalist that took care of you is not available.  Once you are discharged, your primary care physician will handle any further medical issues. Please note that NO REFILLS for any discharge medications will be authorized once you are discharged, as it is imperative that you return to your primary care physician (or establish a relationship with a primary care physician if you do not have one) for your aftercare needs so that they can reassess  your need for medications and monitor your lab values   Increase activity slowly   Complete by: As directed       Allergies as of 10/07/2024   No Known Allergies      Medication List     STOP taking these medications    aspirin  EC 81 MG tablet   olmesartan -hydrochlorothiazide 20-12.5 MG tablet Commonly known as: BENICAR  HCT       TAKE these medications    apixaban 5 MG Tabs tablet Commonly known as: ELIQUIS Take 1 tablet (5 mg total) by mouth 2 (two) times daily.   atorvastatin  40 MG tablet Commonly known as: LIPITOR TAKE 1 TABLET DAILY What changed: when to take this   BACLOFEN  IT 1 Dose by Intrathecal route as directed. Lioresal  1815mcg/459.9mcg daily, Clonidine 417mcg/109.65mcg daily/hydromorphone  48mcg/104.4mcg daily/baclofen  459.62mcg/29.9mcg daily   busPIRone  10 MG tablet Commonly known as: BUSPAR  TAKE 1 TABLET IN THE MORNING, AT NOON, IN THE EVENING AND AT BEDTIME What changed: See the new instructions.   cetirizine 10 MG tablet Commonly known as: ZYRTEC Take 10 mg by mouth daily as needed for allergies.   clonazePAM  1 MG tablet Commonly known as: KLONOPIN  Take 1 tablet (1 mg total) by mouth 4 (four) times daily.   cyclobenzaprine  10 MG tablet Commonly known as: FLEXERIL  TAKE 1 TABLET BY MOUTH THREE TIMES A DAY AS NEEDED FOR MUSCLE SPASMS What changed: See the new  instructions.   Cymbalta  60 MG capsule Generic drug: DULoxetine  Take 1 capsule (60 mg total) by mouth 2 (two) times daily. What changed: Another medication with the same name was removed. Continue taking this medication, and follow the directions you see here.   dalfampridine  10 MG Tb12 Take 1 tablet (10 mg total) by mouth every 12 (twelve) hours.   furosemide  20 MG tablet Commonly known as: LASIX  TAKE 1 TABLET (20 MG TOTAL) BY MOUTH DAILY X 5 DAYS, THEN AS NEEDED FOR FOR SHORTNESS OF BREATH/WEIGHT GAIN>3#, PEDAL EDEMA AND CAN TAKE A SECOND TAB DAILY AS NEEDED What changed: See the  new instructions.   gabapentin  600 MG tablet Commonly known as: NEURONTIN  Take 1 tablet (600 mg total) by mouth 4 (four) times daily.   ibuprofen 200 MG tablet Commonly known as: ADVIL Take 200 mg by mouth every 6 (six) hours as needed for moderate pain (pain score 4-6).   ketoconazole  2 % shampoo Commonly known as: NIZORAL  APPLY TOPICALLY 2 TIMES A WEEK   methylphenidate  10 MG tablet Commonly known as: RITALIN  Take 1 tablet (10 mg total) by mouth 4 (four) times daily.   metoprolol  tartrate 25 MG tablet Commonly known as: LOPRESSOR  Take 0.5 tablets (12.5 mg total) by mouth 2 (two) times daily.   natalizumab 300 MG/15ML injection Commonly known as: TYSABRI Inject 300 mg into the vein every 28 (twenty-eight) days.   nitroGLYCERIN  0.4 MG SL tablet Commonly known as: NITROSTAT  PLACE 1 TABLET UNDER THE TOUNGE EVERY 5 MINUTES AS NEEDED FOR CHEST PAIN (X 3 DOSES)   nystatin  cream Commonly known as: MYCOSTATIN  Apply 1 application  topically as directed.   olmesartan  20 MG tablet Commonly known as: BENICAR  Take 2 tablets (40 mg total) by mouth daily.   pantoprazole  40 MG tablet Commonly known as: PROTONIX  TAKE 1 TABLET BY MOUTH TWICE A DAY   potassium chloride  SA 20 MEQ tablet Commonly known as: Klor-Con  M20 TAKE 1 TABLET BY MOUTH EVERY DAY AS NEEDED WHEN TAKING A SECOND LASIX  TAB ANY GIVEN DAY What changed:  how much to take how to take this when to take this additional instructions   sucralfate  1 g tablet Commonly known as: CARAFATE  TAKE 1 TABLET FOUR TIMES A DAY   Synthroid  137 MCG tablet Generic drug: levothyroxine  Take 137 mcg by mouth daily before breakfast.   tamsulosin  0.4 MG Caps capsule Commonly known as: FLOMAX  TAKE 1 CAPSULE DAILY        Follow-up Information     Domenica Harlene LABOR, MD Follow up in 1 week(s).   Specialty: Family Medicine Contact information: [address] [place] [address] [phone]                 No Known Allergies  The results of significant diagnostics from this hospitalization (including imaging, microbiology, ancillary and laboratory) are listed below for reference.    Microbiology: No results found for this or any previous visit (from the past 240 hours).  Procedures/Studies: ECHOCARDIOGRAM COMPLETE Result Date: 10/07/2024    ECHOCARDIOGRAM REPORT   Patient Name:   Lorin DOUGLES KIMMEY Date of Exam: 10/07/2024 Medical Rec #:  [id number]      Height:       75.0 in Accession #:    [id number]     Weight:       345.0 lb Date of Birth:  07-Nov-1971      BSA:          2.767 m Patient  Age:    53 years       BP:           97/54 mmHg Patient Gender: M              HR:           75 bpm. Exam Location:  Inpatient Procedure: 2D Echo and Intracardiac Opacification Agent (Both Spectral and Color            Flow Doppler were utilized during procedure). Indications:    Chest pain  History:        Patient has prior history of Echocardiogram examinations. CAD;                 Risk Factors:Hypertension.  Sonographer:    Charmaine Gaskins Referring Phys: [phone] EKTA V PATEL IMPRESSIONS  1. Left ventricular ejection fraction, by estimation, is 55 to 60%. The left ventricle has normal function. The left ventricle has no regional wall motion abnormalities. Left ventricular diastolic parameters were normal.  2. Right ventricular systolic function is normal. The right ventricular size is normal. Tricuspid regurgitation signal is inadequate for assessing PA pressure.  3. Left atrial size was mildly dilated.  4. The mitral valve is normal in structure. Trivial mitral valve regurgitation. No evidence of mitral stenosis.  5. The aortic valve is normal in structure. Aortic valve regurgitation is not visualized. No aortic stenosis is present.  6. The inferior vena cava is normal in size with greater than 50% respiratory variability, suggesting right atrial pressure of 3 mmHg. Comparison(s): No significant change from prior study.  Conclusion(s)/Recommendation(s): Normal biventricular function without evidence of hemodynamically significant valvular heart disease. FINDINGS  Left Ventricle: Left ventricular ejection fraction, by estimation, is 55 to 60%. The left ventricle has normal function. The left ventricle has no regional wall motion abnormalities. Definity contrast agent was given IV to delineate the left ventricular  endocardial borders. The left ventricular internal cavity size was normal in size. There is no left ventricular hypertrophy. Left ventricular diastolic parameters were normal. Right Ventricle: The right ventricular size is normal. No increase in right ventricular wall thickness. Right ventricular systolic function is normal. Tricuspid regurgitation signal is inadequate for assessing PA pressure. Left Atrium: Left atrial size was mildly dilated. Right Atrium: Right atrial size was normal in size. Pericardium: There is no evidence of pericardial effusion. Mitral Valve: The mitral valve is normal in structure. Trivial mitral valve regurgitation. No evidence of mitral valve stenosis. Tricuspid Valve: The tricuspid valve is normal in structure. Tricuspid valve regurgitation is not demonstrated. No evidence of tricuspid stenosis. Aortic Valve: The aortic valve is normal in structure. Aortic valve regurgitation is not visualized. No aortic stenosis is present. Pulmonic Valve: The pulmonic valve was normal in structure. Pulmonic valve regurgitation is not visualized. No evidence of pulmonic stenosis. Aorta: The aortic root and ascending aorta are structurally normal, with no evidence of dilitation. Venous: The inferior vena cava is normal in size with greater than 50% respiratory variability, suggesting right atrial pressure of 3 mmHg. IAS/Shunts: No atrial level shunt detected by color flow Doppler.  LEFT VENTRICLE PLAX 2D LVIDd:         4.40 cm   Diastology LVIDs:         3.50 cm   LV e' medial:    8.92 cm/s LV PW:         0.90  cm   LV E/e' medial:  7.0 LV IVS:  1.00 cm   LV e' lateral:   14.80 cm/s LVOT diam:     2.50 cm   LV E/e' lateral: 4.2 LVOT Area:     4.91 cm  RIGHT VENTRICLE RV Basal diam:  3.10 cm RV Mid diam:    3.40 cm RV S prime:     13.50 cm/s LEFT ATRIUM              Index        RIGHT ATRIUM           Index LA diam:        4.00 cm  1.45 cm/m   RA Area:     20.60 cm LA Vol (A2C):   74.3 ml  26.86 ml/m  RA Volume:   57.40 ml  20.75 ml/m LA Vol (A4C):   100.0 ml 36.15 ml/m LA Biplane Vol: 87.5 ml  31.63 ml/m   AORTA Ao Root diam: 3.80 cm Ao Asc diam:  3.20 cm MITRAL VALVE MV Area (PHT): 4.17 cm    SHUNTS MV Decel Time: 182 msec    Systemic Diam: 2.50 cm MV E velocity: 62.40 cm/s MV A velocity: 41.40 cm/s MV E/A ratio:  1.51 Georganna Archer Electronically signed by Georganna Archer Signature Date/Time: 10/07/2024/12:59:34 PM    Final    CT Angio Chest PE W and/or Wo Contrast Result Date: 10/06/2024 EXAM: CTA of the Chest without and with IV contrast for PE 10/06/2024 06:44:08 PM TECHNIQUE: CTA of the chest was performed without and with the administration of intravenous contrast (80 mL iohexol  (OMNIPAQUE ) 350 MG/ML injection). Multiplanar reformatted images are provided for review. MIP images are provided for review. Automated exposure control, iterative reconstruction, and/or weight based adjustment of the mA/kV was utilized to reduce the radiation dose to as low as reasonably achievable. COMPARISON: CT angiogram chest 04/21/2011. CLINICAL HISTORY: Pulmonary embolism (PE) suspected, high prob. FINDINGS: PULMONARY ARTERIES: Pulmonary arteries are adequately opacified for evaluation. No pulmonary embolism. Main pulmonary artery is normal in caliber. MEDIASTINUM: Atherosclerotic calcifications of the coronary arteries. The heart and pericardium demonstrate no acute abnormality. There is no acute abnormality of the thoracic aorta. LYMPH NODES: No mediastinal, hilar or axillary lymphadenopathy. LUNGS AND PLEURA:  Minimal patchy airspace and ground glass opacities in the inferior lower lobes bilaterally. There is some atelectasis in the lingula. No pleural effusion or pneumothorax. UPPER ABDOMEN: Rounded hypodensities in the liver which are too small to characterize and unchanged. SOFT TISSUES AND BONES: No acute bone or soft tissue abnormality. IMPRESSION: 1. No pulmonary embolism. 2. Minimal patchy airspace and ground-glass opacities in the dependent lower lobes bilaterally, which may reflect atelectasis or mild aspiration/inflammation; correlate clinically. 3. Incidental coronary artery calcifications, compatible with coronary atherosclerosis; consider assessment of cardiovascular risk factors and preventive therapy as clinically indicated. Electronically signed by: Greig Pique MD 10/06/2024 07:52 PM EDT RP Workstation: [id number]   DG Chest 1 View Result Date: 10/06/2024 EXAM: 1 VIEW(S) XRAY OF THE CHEST 10/06/2024 10:51:00 AM COMPARISON: 04/21/2023 CLINICAL HISTORY: Chest pain FINDINGS: LINES, TUBES AND DEVICES: Overlying monitor wires. LUNGS AND PLEURA: No focal pulmonary opacity. No pulmonary edema. No pleural effusion. No pneumothorax. HEART AND MEDIASTINUM: No acute abnormality of the cardiac and mediastinal silhouettes. BONES AND SOFT TISSUES: Thoracic spondylosis. IMPRESSION: 1. No acute cardiopulmonary pathology. Electronically signed by: Franky Stanford MD 10/06/2024 11:14 AM EDT RP Workstation: [id number]    Labs: BNP (last 3 results) Recent Labs    10/06/24 1037  BNP 97.3   Basic Metabolic  Panel: Recent Labs  Lab 10/06/24 1037 10/06/24 1051 10/07/24 0628  NA 140 141 140  K 3.3* 3.2* 4.2  CL 104 102 103  CO2 25  --  26  GLUCOSE 133* 129* 97  BUN 18 19 17   CREATININE 0.92 1.00 0.97  CALCIUM  8.7*  --  8.5*  MG 2.0  --  2.1   Liver Function Tests: Recent Labs  Lab 10/06/24 1037  AST 17  ALT 19  ALKPHOS 80  BILITOT 0.7  PROT 6.3*  ALBUMIN 3.6   Recent Labs  Lab 10/06/24 1037   LIPASE 35   No results for input(s): AMMONIA in the last 168 hours. CBC: Recent Labs  Lab 10/06/24 1037 10/06/24 1051 10/07/24 0628  WBC 8.1  --  7.1  HGB 12.5* 12.6* 11.3*  HCT 38.6* 37.0* 33.9*  MCV 91.3  --  89.0  PLT 201  --  186   CBG: No results for input(s): GLUCAP in the last 168 hours. Hgb A1c Recent Labs    10/06/24 1037  HGBA1C 5.6   Anemia work up No results for input(s): VITAMINB12, FOLATE, FERRITIN, TIBC, IRON, RETICCTPCT in the last 72 hours. Cardiac Enzymes: No results for input(s): CKTOTAL, CKMB, CKMBINDEX, TROPONINI in the last 168 hours. BNP: Invalid input(s): POCBNP D-Dimer No results for input(s): DDIMER in the last 72 hours. Lipid Profile No results for input(s): CHOL, HDL, LDLCALC, TRIG, CHOLHDL, LDLDIRECT in the last 72 hours. Thyroid  function studies Recent Labs    10/07/24 0311  TSH 1.797   Urinalysis    Component Value Date/Time   COLORURINE YELLOW 02/11/2016 1539   APPEARANCEUR CLEAR 02/11/2016 1539   LABSPEC <=1.005 (A) 02/11/2016 1539   PHURINE 7.5 02/11/2016 1539   GLUCOSEU NEGATIVE 02/11/2016 1539   HGBUR NEGATIVE 02/11/2016 1539   BILIRUBINUR NEGATIVE 02/11/2016 1539   KETONESUR NEGATIVE 02/11/2016 1539   UROBILINOGEN 0.2 02/11/2016 1539   NITRITE NEGATIVE 02/11/2016 1539   LEUKOCYTESUR NEGATIVE 02/11/2016 1539   Sepsis Labs Recent Labs  Lab 10/06/24 1037 10/07/24 0628  WBC 8.1 7.1   Microbiology No results found for this or any previous visit (from the past 240 hours).   Time coordinating discharge: 35 minutes  SIGNED: Mennie LAMY, MD  [facility] 10/07/2024, 3:17 PM  If 7PM-7AM, please contact night-coverage [email or web address]

## 2024-10-07 NOTE — Discharge Instructions (Signed)

## 2024-10-07 NOTE — Progress Notes (Signed)
 [facility] night cross cover note:   Regarding this patient who is hospitalized with atrial fibrillation with RVR, NSTEMI, with cardiology consulted/following an anticipating TEE with electrocardioversion in the AM, pt's RN conveys that pt's BP is running slightly softer this AM.  Specifically, most recent blood pressure noted to be 86/63, relative to systolic blood pressures in the 90s to low 100s throughout yesterday afternoon and overnight thus far.  He remains in atrial fibrillation, with improvement in rate control relative to initial presentation, with most recent heart rates in the 90s to low 100s.  Other vital signs appear stable, including respiratory rate 18-19, and oxygen saturation 97% while on home nocturnal CPAP in the context of history of obstructive sleep apnea.  Per my discussions with the patient's RN, the patient has been experiencing constant chest pain over the last 11 days, the patient currently noting some improvement in his chest pain with improving rate control relative to the preceding 11 days.   No overt evidence of associated or resultant acutely decompensated heart failure, with presenting chest x-ray showing no evidence of acute cardiopulmonary process, while presenting BNP was found to be 97.3.  I subsequently ordered a small fluid bolus in the form of 500 cc of LR.  Additionally, I ordered morning labs in the form of BMP, CBC, as well as magnesium level.  Will continue to monitor ensuing blood pressure and heart rate trend, including in response to this small ivf bolus.      Eva Pore, DO Hospitalist

## 2024-10-07 NOTE — Telephone Encounter (Signed)
 Pharmacy Patient Advocate Encounter  Insurance verification completed.    The patient is insured through [employer]. Patient has [employer], may use a copay card, and/or apply for patient assistance if available.    Ran test claim for Eliquis 5mg  and the current 30 day co-pay is $0.   This test claim was processed through [facility]- copay amounts may vary at other pharmacies due to [employer], or as the patient moves through the different stages of their insurance plan.

## 2024-10-07 NOTE — Progress Notes (Signed)
 Spontaneously converted to sinus rhythm around 11 AM.  TEE-cardioversion canceled. Transthoracic echo shows normal left ventricular systolic function and normal regional wall motion. Chest pain is essentially gone as soon as he converted to normal rhythm.  Will plan outpatient nuclear perfusion imaging to see whether he should undergo cardiac catheterization.  Since his blood pressure does not allow a rate control medication we will stop the hydrochlorothiazide component of his Benicar  HCT so that he can take metoprolol  tartrate.  If he has early recurrence of atrial fibrillation would recommend prompt referral to EP to discuss ablation, probably with a temporary amiodarone bridge.  He should keep his follow-up redo sleep study as scheduled with Dr. Shellia, since insufficiently treated sleep apnea could well be the driver for his arrhythmia.  [facility] will sign off.   Ready for discharge today from a cardiology standpoint. Medication Recommendations:    New medications: - Apixaban 5 mg twice daily - Metoprolol  tartrate 12.5 mg twice daily - Olmesartan  20 mg once daily  Stop olmesartan  HCT Stop aspirin   Continue previous doses of atorvastatin  and furosemide  Other recommendations (labs, testing, etc): Outpatient cardiac PET scan (if delay will be more than 4 weeks, will schedule for Lexiscan  Myoview ) Follow up as an outpatient: In 4 weeks with Dr. Wonda or APP

## 2024-10-07 NOTE — Progress Notes (Signed)
 LR 500ml bolus was initiated. Post boluls BP=97/54.

## 2024-10-09 NOTE — Assessment & Plan Note (Signed)
 On Levothyroxine, continue to monitor

## 2024-10-09 NOTE — Assessment & Plan Note (Signed)
 Well controlled, no changes to meds. Encouraged heart healthy diet such as the DASH diet and exercise as tolerated.

## 2024-10-09 NOTE — Progress Notes (Unsigned)
 Subjective:    Patient ID: PELLEGRINO Morrow, male    DOB: 1971-02-24, 53 y.o.   MRN: [id number]  No chief complaint on file.   HPI Discussed the use of AI scribe software for clinical note transcription with the patient, who gave verbal consent to proceed.  History of Present Illness Keith Morrow is a 53 year old male with atrial fibrillation who presents for follow-up after a recent hospitalization.  He experienced a recent episode of atrial fibrillation with a heart rate reaching 180 beats per minute, accompanied by symptoms of feeling like 'the whole world's closing in' and significant fatigue. He was hospitalized and started on Eliquis and metoprolol . His blood pressure has been low, around 95/65 mmHg, leading to missed doses of metoprolol  in the hospital. A PET CT cardiac scan is scheduled for November 13th.  He has been experiencing severe heartburn for the past four weeks, which he associates with starting a generic form of Cymbalta . He has been taking sucralfate  two to three times a day without relief and reports that it 'burns going down.' He has also been using pantoprazole  twice daily and has been using over-the-counter Mylanta or Pepcid  as needed.  He has a history of low potassium, which was treated in the hospital. He has not been taking Chlorcon recently and is awaiting current potassium Morrow to determine if supplementation is needed.  He has been on Benicar  for hypertension, historically taking 20 mg daily. He was recently prescribed a higher dose but has continued with his usual dose due to concerns about low blood pressure. He reports his blood pressure is stable on the lower dose.  He has been experiencing indigestion and heartburn since starting a generic form of Cymbalta , which he finds ineffective and associated with 'severe heartburn and attitude' issues. He has requested a prescription for the brand name due to these side effects.  He uses clonazepam  for anxiety,  taking three to four tablets daily depending on his symptoms.  He has a history of low calcium  and protein Morrow, noted during his recent hospitalization.    Past Medical History:  Diagnosis Date   Acute bronchitis 05/09/2013   Allergy    Bronchitis, acute 12/10/2011   Candidal skin infection 05/05/2017   Chicken pox as a child   Constipation 02/20/2017   Coronary atherosclerosis of native coronary artery    a. NSTEMI 04/20/11: tx with Promus DES to LAD; bifurcation lesion with oDx 90% tx with POBA; b. staged PCI of right post. AV Branch with Promus DES; c. 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   Visual changes 11/13/2016    Past Surgical History:  Procedure Laterality Date   COLONOSCOPY N/A 08/04/2024   Procedure: COLONOSCOPY;  Surgeon: Legrand Victory Keith DOUGLAS, MD;  Location: [facility];  Service: Gastroenterology;  Laterality: N/A;  CORONARY ANGIOPLASTY WITH STENT PLACEMENT     2012   ESOPHAGOGASTRODUODENOSCOPY N/A 08/04/2024   Procedure: EGD (ESOPHAGOGASTRODUODENOSCOPY);  Surgeon: Legrand Victory Keith DOUGLAS, MD;  Location: [facility];  Service: Gastroenterology;  Laterality: N/A;   PAIN PUMP IMPLANTATION N/A 08/29/2014   Procedure: Baclofen  pump placement;  Surgeon: Keith Levels, MD;  Location: [facility];  Service: Neurosurgery;  Laterality: N/A;  Baclofen  pump placement   PAIN PUMP IMPLANTATION Right 09/20/2020   Procedure: Right  Baclofen  pump replacement;  Surgeon: Morrow Fairy, MD;  [facility];  Service: Neurosurgery;  Laterality: Right;  Right Baclofen  pump replacement   PROGRAMABLE BACLOFEN  PUMP REVISION  08/29/14   right wrist surgery     CTR   VASCULAR SURGERY     vericose vein in right femoral area   VASECTOMY     WISDOM TOOTH EXTRACTION      Family History  Problem Relation Age of Onset   Heart attack Mother 42   Thyroid  disease Mother    Heart disease Mother    Thyroid  disease Sister    Thyroid  disease Brother    Heart disease Maternal Grandmother    Hypertension Maternal Grandmother    Hyperlipidemia Maternal Grandmother    Heart disease Maternal Grandfather    Hypertension Maternal Grandfather    Hyperlipidemia Maternal Grandfather    Stroke Paternal Grandfather    Thyroid  disease Sister    Heart disease Father        2 stents   Coronary artery disease Other        questionable in father    Social History   Socioeconomic History   Marital status: Married    Spouse name: Not on file   Number of children: 3   Years of education: Not on file   Highest education level: Bachelor's degree (e.g., BA, AB, BS)  Occupational History   Not on file  Tobacco Use   Smoking status: Never   Smokeless tobacco: Never  Vaping Use   Vaping status: Never Used  Substance and Sexual Activity   Alcohol use: Yes    Comment: socially   Drug use: No   Sexual activity: Yes    Partners: Female    Comment: 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, works Engineer, Maintenance, no dietary restrictions  Other Topics Concern   Not on file  Social History Narrative   Not on file   Social Drivers of Health   Financial Resource Strain: Low Risk  (06/06/2024)   Overall Financial Resource Strain (CARDIA)    Difficulty of Paying Living Expenses: Not hard at all  Food Insecurity: No Food Insecurity (08/03/2024)   Hunger Vital Sign    Worried About Running Out of Food in the Last Year: Never true    Ran Out of Food in  the Last Year: Never true  Transportation Needs: No Transportation Needs (08/03/2024)   PRAPARE - Administrator, Civil Service (Medical): No    Lack of Transportation (Non-Medical): No  Physical Activity: Inactive (06/06/2024)   Exercise Vital Sign    Days of Exercise per Week: 0 days    Minutes of Exercise per Session: Not on file  Stress: Stress Concern Present (06/06/2024)   [employer] of Occupational Health - Occupational Stress Questionnaire    Feeling of Stress: Rather much  Social Connections: Unknown (08/03/2024)   Social Connection and Isolation Panel    Frequency of Communication with Friends and Family: Patient declined    Frequency of Social Gatherings  with Friends and Family: Patient declined    Attends Religious Services: Patient declined    Active Member of Clubs or Organizations: No    Attends Banker Meetings: Patient declined    Marital Status: Married  Catering Manager Violence: Not At Risk (08/03/2024)   Humiliation, Afraid, Rape, and Kick questionnaire    Fear of Current or Ex-Partner: No    Emotionally Abused: No    Physically Abused: No    Sexually Abused: No    Outpatient Medications Prior to Visit  Medication Sig Dispense Refill   apixaban (ELIQUIS) 5 MG TABS tablet Take 1 tablet (5 mg total) by mouth 2 (two) times daily. 60 tablet 0   atorvastatin  (LIPITOR) 40 MG tablet TAKE 1 TABLET DAILY (Patient taking differently: Take 40 mg by mouth at bedtime.) 90 tablet 3   BACLOFEN  IT 1 Dose by Intrathecal route as directed. Lioresal  1868mcg/459.9mcg daily, Clonidine 467mcg/109.65mcg daily/hydromorphone  470mcg/104.4mcg daily/baclofen  459.61mcg/29.9mcg daily     busPIRone  (BUSPAR ) 10 MG tablet TAKE 1 TABLET IN THE MORNING, AT NOON, IN THE EVENING AND AT BEDTIME (Patient taking differently: Take 10 mg by mouth 4 (four) times daily - after meals and at bedtime.) 360 tablet 3   cetirizine (ZYRTEC) 10 MG tablet Take 10 mg by mouth daily as  needed for allergies.     clonazePAM  (KLONOPIN ) 1 MG tablet Take 1 tablet (1 mg total) by mouth 4 (four) times daily. 360 tablet 1   cyclobenzaprine  (FLEXERIL ) 10 MG tablet TAKE 1 TABLET BY MOUTH THREE TIMES A DAY AS NEEDED FOR MUSCLE SPASMS (Patient taking differently: Take 10 mg by mouth at bedtime.) 90 tablet 11   CYMBALTA  60 MG capsule Take 1 capsule (60 mg total) by mouth 2 (two) times daily. 180 capsule 0   dalfampridine  10 MG TB12 Take 1 tablet (10 mg total) by mouth every 12 (twelve) hours. 180 tablet 1   furosemide  (LASIX ) 20 MG tablet TAKE 1 TABLET (20 MG TOTAL) BY MOUTH DAILY X 5 DAYS, THEN AS NEEDED FOR FOR SHORTNESS OF BREATH/WEIGHT GAIN>3#, PEDAL EDEMA AND CAN TAKE A SECOND TAB DAILY AS NEEDED (Patient taking differently: Take 20 mg by mouth daily as needed for fluid or edema. Take 20 mg by mouth in the morning and an additional 20 mg once a day as needed for shortness of breath or a weight gain of 3 pounds/pedal edema) 180 tablet 0   gabapentin  (NEURONTIN ) 600 MG tablet Take 1 tablet (600 mg total) by mouth 4 (four) times daily. 360 tablet 3   ibuprofen (ADVIL) 200 MG tablet Take 200 mg by mouth every 6 (six) hours as needed for moderate pain (pain score 4-6).     ketoconazole  (NIZORAL ) 2 % shampoo APPLY TOPICALLY 2 TIMES A WEEK 120 mL 5   methylphenidate  (RITALIN ) 10 MG tablet Take 1 tablet (10 mg total) by mouth 4 (four) times daily. 120 tablet 0   metoprolol  tartrate (LOPRESSOR ) 25 MG tablet Take 0.5 tablets (12.5 mg total) by mouth 2 (two) times daily. 30 tablet 0   natalizumab (TYSABRI) 300 MG/15ML injection Inject 300 mg into the vein every 28 (twenty-eight) days.      nitroGLYCERIN  (NITROSTAT ) 0.4 MG SL tablet PLACE 1 TABLET UNDER THE TOUNGE EVERY 5 MINUTES AS NEEDED FOR CHEST PAIN (X 3 DOSES) 25 tablet 6   nystatin  cream (MYCOSTATIN ) Apply 1 application  topically as directed. 30 g 2   olmesartan  (BENICAR ) 20 MG tablet Take 2 tablets (40 mg total) by mouth  daily. 60 tablet 0    pantoprazole  (PROTONIX ) 40 MG tablet TAKE 1 TABLET BY MOUTH TWICE A DAY 180 tablet 3   potassium chloride  SA (KLOR-CON  M20) 20 MEQ tablet TAKE 1 TABLET BY MOUTH EVERY DAY AS NEEDED WHEN TAKING A SECOND LASIX  TAB ANY GIVEN DAY (Patient taking differently: Take 20 mEq by mouth once. Take 20 mEq by mouth once a day when a second dose of Furosemide  has been taken) 90 tablet 3   sucralfate  (CARAFATE ) 1 g tablet TAKE 1 TABLET FOUR TIMES A DAY 360 tablet 3   SYNTHROID  137 MCG tablet Take 137 mcg by mouth daily before breakfast.     tamsulosin  (FLOMAX ) 0.4 MG CAPS capsule TAKE 1 CAPSULE DAILY 90 capsule 3   No facility-administered medications prior to visit.    No Known Allergies  Review of Systems  Constitutional:  Positive for malaise/fatigue. Negative for fever.  HENT:  Negative for congestion.   Eyes:  Negative for blurred vision.  Respiratory:  Positive for shortness of breath.   Cardiovascular:  Positive for chest pain and palpitations. Negative for leg swelling.  Gastrointestinal:  Negative for abdominal pain, blood in stool and nausea.  Genitourinary:  Negative for dysuria and frequency.  Musculoskeletal:  Negative for falls.  Skin:  Negative for rash.  Neurological:  Positive for weakness. Negative for dizziness, loss of consciousness and headaches.  Endo/Heme/Allergies:  Negative for environmental allergies.  Psychiatric/Behavioral:  Positive for depression. The patient is nervous/anxious.        Objective:    Physical Exam Vitals reviewed.  Constitutional:      Appearance: Normal appearance. He is obese. He is not ill-appearing.  HENT:     Head: Normocephalic and atraumatic.     Nose: Nose normal.  Eyes:     Conjunctiva/sclera: Conjunctivae normal.  Cardiovascular:     Rate and Rhythm: Normal rate.     Pulses: Normal pulses.     Heart sounds: Normal heart sounds. No murmur heard. Pulmonary:     Effort: Pulmonary effort is normal.     Breath sounds: Normal breath  sounds. No wheezing.  Abdominal:     Palpations: Abdomen is soft. There is no mass.     Tenderness: There is no abdominal tenderness.  Musculoskeletal:     Cervical back: Normal range of motion.     Right lower leg: No edema.     Left lower leg: No edema.  Skin:    General: Skin is warm and dry.  Neurological:     Mental Status: He is alert and oriented to person, place, and time.     Coordination: Coordination abnormal.     Comments: Wheelchair bound  Psychiatric:        Mood and Affect: Mood normal.    There were no vitals taken for this visit. Wt Readings from Last 3 Encounters:  10/06/24 (!) 345 lb (156.5 kg)  07/27/24 (!) 328 lb (148.8 kg)  06/06/24 (!) 328 lb 9.6 oz (149.1 kg)    Diabetic Foot Exam - Simple   No data filed    Lab Results  Component Value Date   WBC 7.1 10/07/2024   HGB 11.3 (L) 10/07/2024   HCT 33.9 (L) 10/07/2024   PLT 186 10/07/2024   GLUCOSE 97 10/07/2024   CHOL 136 06/06/2024   TRIG 63.0 06/06/2024   HDL 44.60 06/06/2024   LDLCALC 79 06/06/2024   ALT 19 10/06/2024   AST 17 10/06/2024   NA 140 10/07/2024  K 4.2 10/07/2024   CL 103 10/07/2024   CREATININE 0.97 10/07/2024   BUN 17 10/07/2024   CO2 26 10/07/2024   TSH 1.797 10/07/2024   PSA 0.52 02/11/2016   INR 1.00 04/21/2011   HGBA1C 5.6 10/06/2024    Lab Results  Component Value Date   TSH 1.797 10/07/2024   Lab Results  Component Value Date   WBC 7.1 10/07/2024   HGB 11.3 (L) 10/07/2024   HCT 33.9 (L) 10/07/2024   MCV 89.0 10/07/2024   PLT 186 10/07/2024   Lab Results  Component Value Date   NA 140 10/07/2024   K 4.2 10/07/2024   CO2 26 10/07/2024   GLUCOSE 97 10/07/2024   BUN 17 10/07/2024   CREATININE 0.97 10/07/2024   BILITOT 0.7 10/06/2024   ALKPHOS 80 10/06/2024   AST 17 10/06/2024   ALT 19 10/06/2024   PROT 6.3 (L) 10/06/2024   ALBUMIN 3.6 10/06/2024   CALCIUM  8.5 (L) 10/07/2024   ANIONGAP 11 10/07/2024   EGFR 105 04/02/2021   GFR 92.82 06/06/2024    Lab Results  Component Value Date   CHOL 136 06/06/2024   Lab Results  Component Value Date   HDL 44.60 06/06/2024   Lab Results  Component Value Date   LDLCALC 79 06/06/2024   Lab Results  Component Value Date   TRIG 63.0 06/06/2024   Lab Results  Component Value Date   CHOLHDL 3 06/06/2024   Lab Results  Component Value Date   HGBA1C 5.6 10/06/2024       Assessment & Plan:  Other specified hypothyroidism Assessment & Plan: On Levothyroxine , continue to monitor   Hyperglycemia Assessment & Plan: hgba1c acceptable, minimize simple carbs. Increase exercise as tolerated.    Primary hypertension Assessment & Plan: Well controlled, no changes to meds. Encouraged heart healthy diet such as the DASH diet and exercise as tolerated.    Mixed hyperlipidemia Assessment & Plan: Encourage heart healthy diet such as MIND or DASH diet, increase exercise, avoid trans fats, simple carbohydrates and processed foods, consider a krill or fish or flaxseed oil cap daily. Tolerating Atorvastatin    Paroxysmal atrial fibrillation with RVR (HCC) Assessment & Plan: Recently hospitalized with new onset afib with RVR now Rate controlled and tolerating Eliquis. Will follow up with cardiology     Assessment and Plan Assessment & Plan Paroxysmal atrial fibrillation Recent episode with heart rate up to 180 bpm, associated with chest pain and indigestion. Hospitalized and started on Eliquis. Awaiting PET CT cardiac scan on November 13th to assess for blockages. Follow-up with cardiology PA on November 26th. Blood pressure management is crucial due to hypotension during hospitalization. - Continue Eliquis 1 pill twice daily. - Continue metoprolol  half a tablet twice daily. - Monitor blood pressure closely. - Await PET CT cardiac scan results. - Follow up with cardiology PA on November 26th.  Primary hypertension Blood pressure has been low, with readings as low as 95/65 mmHg.  Metoprolol  and Benicar  HCT are contributing to hypotension. Adjustments made to Benicar  HCT dosing to prevent further hypotension. - Adjusted Benicar  HCT to 20 mg once daily. - Monitor blood pressure regularly.  Gastroesophageal reflux disease Persistent heartburn for four weeks, not relieved by sucralfate . Pantoprazole  is being taken twice daily. Sucralfate  is not providing relief and may be causing discomfort. - Continue pantoprazole  40 mg twice daily. - Use Mylanta or Pepcid  as needed for breakthrough heartburn. - Discontinued sucralfate  unless needed.  Depression with anxiety Experiencing increased anxiety and emotional  instability, possibly exacerbated by recent health events. Clonazepam  is being used as needed for anxiety. Cymbalta  was switched to generic, causing adverse effects including heartburn and emotional instability. Brand name Cymbalta  is preferred. - Refilled clonazepam  1 mg up to four times daily as needed. - Rewrote Cymbalta  prescription for brand name only, 90-day supply with refill. - Monitor emotional stability and adjust treatment as needed.  Hypocalcemia Low calcium  Morrow noted during hospitalization. Vitamin D deficiency is suspected as a contributing factor. - Ordered metabolic panel to check calcium  and vitamin D Morrow. - Increase dietary protein and calcium  intake.  Anemia Mild anemia noted during hospitalization. No signs of gastrointestinal bleeding reported. - Ordered complete blood count to monitor anemia. - Increase dietary protein intake.  General Health Maintenance Annual flu shot recommended due to recent health events. - Administered flu shot today.  Recording duration: 32 minutes     Harlene Horton, MD

## 2024-10-09 NOTE — Assessment & Plan Note (Signed)
 Recently hospitalized with new onset afib with RVR now Rate controlled and tolerating Eliquis. Will follow up with cardiology

## 2024-10-09 NOTE — Assessment & Plan Note (Signed)
 hgba1c acceptable, minimize simple carbs. Increase exercise as tolerated.

## 2024-10-09 NOTE — Assessment & Plan Note (Signed)
 Encourage heart healthy diet such as MIND or DASH diet, increase exercise, avoid trans fats, simple carbohydrates and processed foods, consider a krill or fish or flaxseed oil cap daily. Tolerating Atorvastatin

## 2024-10-10 ENCOUNTER — Other Ambulatory Visit: Payer: Self-pay | Admitting: Neurology

## 2024-10-10 ENCOUNTER — Ambulatory Visit: Admitting: Family Medicine

## 2024-10-10 ENCOUNTER — Ambulatory Visit: Payer: Self-pay | Admitting: Family Medicine

## 2024-10-10 VITALS — BP 116/72 | HR 58 | Temp 98.0°F | Resp 16 | Ht 75.0 in | Wt 334.8 lb

## 2024-10-10 DIAGNOSIS — Z23 Encounter for immunization: Secondary | ICD-10-CM | POA: Diagnosis not present

## 2024-10-10 DIAGNOSIS — E782 Mixed hyperlipidemia: Secondary | ICD-10-CM

## 2024-10-10 DIAGNOSIS — D649 Anemia, unspecified: Secondary | ICD-10-CM

## 2024-10-10 DIAGNOSIS — E559 Vitamin D deficiency, unspecified: Secondary | ICD-10-CM

## 2024-10-10 DIAGNOSIS — I10 Essential (primary) hypertension: Secondary | ICD-10-CM | POA: Diagnosis not present

## 2024-10-10 DIAGNOSIS — E038 Other specified hypothyroidism: Secondary | ICD-10-CM | POA: Diagnosis not present

## 2024-10-10 DIAGNOSIS — R739 Hyperglycemia, unspecified: Secondary | ICD-10-CM | POA: Diagnosis not present

## 2024-10-10 DIAGNOSIS — K219 Gastro-esophageal reflux disease without esophagitis: Secondary | ICD-10-CM

## 2024-10-10 DIAGNOSIS — I480 Paroxysmal atrial fibrillation: Secondary | ICD-10-CM

## 2024-10-10 LAB — LIPID PANEL
Cholesterol: 125 mg/dL (ref 0–200)
HDL: 46.2 mg/dL (ref >39.00)
LDL Cholesterol: 66 mg/dL (ref 0–99)
NonHDL: 79.26
Total CHOL/HDL Ratio: 3
Triglycerides: 64 mg/dL (ref 0.0–149.0)
VLDL: 12.8 mg/dL (ref 0.0–40.0)

## 2024-10-10 LAB — CBC WITH DIFFERENTIAL/PLATELET
Absolute Lymphocytes: 2209 {cells}/uL (ref 850–3900)
Absolute Monocytes: 571 {cells}/uL (ref 200–950)
Basophils Absolute: 92 {cells}/uL (ref 0–200)
Basophils Relative: 1.1 %
Eosinophils Absolute: 832 {cells}/uL — ABNORMAL HIGH (ref 15–500)
Eosinophils Relative: 9.9 %
HCT: 34.8 % — ABNORMAL LOW (ref 38.5–50.0)
Hemoglobin: 11.5 g/dL — ABNORMAL LOW (ref 13.2–17.1)
MCH: 29.6 pg (ref 27.0–33.0)
MCHC: 33 g/dL (ref 32.0–36.0)
MCV: 89.5 fL (ref 80.0–100.0)
MPV: 10.9 fL (ref 7.5–12.5)
Monocytes Relative: 6.8 %
Neutro Abs: 4696 {cells}/uL (ref 1500–7800)
Neutrophils Relative %: 55.9 %
Platelets: 185 Thousand/uL (ref 140–400)
RBC: 3.89 Million/uL — ABNORMAL LOW (ref 4.20–5.80)
RDW: 14.2 % (ref 11.0–15.0)
Total Lymphocyte: 26.3 %
WBC: 8.4 Thousand/uL (ref 3.8–10.8)

## 2024-10-10 LAB — TSH: TSH: 3.14 u[IU]/mL (ref 0.35–5.50)

## 2024-10-10 LAB — COMPREHENSIVE METABOLIC PANEL WITH GFR
ALT: 18 U/L (ref 0–53)
AST: 13 U/L (ref 0–37)
Albumin: 3.9 g/dL (ref 3.5–5.2)
Alkaline Phosphatase: 82 U/L (ref 39–117)
BUN: 22 mg/dL (ref 6–23)
CO2: 31 meq/L (ref 19–32)
Calcium: 8.8 mg/dL (ref 8.4–10.5)
Chloride: 104 meq/L (ref 96–112)
Creatinine, Ser: 0.98 mg/dL (ref 0.40–1.50)
GFR: 89.2 mL/min (ref >60.00)
Glucose, Bld: 97 mg/dL (ref 70–99)
Potassium: 4.2 meq/L (ref 3.5–5.1)
Sodium: 142 meq/L (ref 135–145)
Total Bilirubin: 0.6 mg/dL (ref 0.2–1.2)
Total Protein: 6.2 g/dL (ref 6.0–8.3)

## 2024-10-10 LAB — VITAMIN D 25 HYDROXY (VIT D DEFICIENCY, FRACTURES): VITD: 9.21 ng/mL — ABNORMAL LOW (ref 30.00–100.00)

## 2024-10-10 MED ORDER — METHYLPHENIDATE HCL 10 MG PO TABS: 10.0000 mg | ORAL_TABLET | Freq: Four times a day (QID) | ORAL | 0 refills | Status: DC

## 2024-10-10 MED ORDER — OLMESARTAN MEDOXOMIL 20 MG PO TABS: 20.0000 mg | ORAL_TABLET | Freq: Every day | ORAL | Status: DC

## 2024-10-10 MED ORDER — SUCRALFATE 1 G PO TABS: 1.0000 g | ORAL_TABLET | Freq: Four times a day (QID) | ORAL | Status: AC | PRN

## 2024-10-10 MED ORDER — CLONAZEPAM 1 MG PO TABS: 1.0000 mg | ORAL_TABLET | Freq: Four times a day (QID) | ORAL | 1 refills | Status: AC

## 2024-10-10 MED ORDER — CYMBALTA 60 MG PO CPEP: 60.0000 mg | ORAL_CAPSULE | Freq: Two times a day (BID) | ORAL | 0 refills | Status: DC

## 2024-10-10 NOTE — Patient Instructions (Addendum)
 Pepcid /Famotidine  20 mg tabs 1-2 daily as needed for heartburn  Can also try Mylanta liquid as needed  Heartburn Heartburn is when you feel pain or discomfort in your throat or chest. It may feel like a burning pain. It can also cause a bad, acid-like taste in your mouth. It may feel worse: When you lie down. When you bend over. At night. Heartburn may be caused by the contents in your stomach moving back up into your esophagus. Your esophagus is the part of your body that moves food from your mouth to your stomach. Follow these instructions at home: Eating and drinking Follow an eating plan as told by your health care provider. Avoid certain foods and drinks as told. These may include: Coffee and tea, with or without caffeine. Alcohol. Energy drinks and sports drinks. Fizzy drinks or sodas. Chocolate and cocoa. Peppermint and mint flavorings. Garlic and onions. Horseradish. Spicy and acidic foods. These include: Peppers. Chili powder and curry powder. Vinegar. Hot sauces and BBQ sauce. Citrus fruits and juices. These include: Oranges. Lemons. Limes. Tomato-based foods. These include: Red sauce and pizza with red sauce. Chili. Salsa. Fried and fatty foods. These include: Donuts. French fries. Potato chips. High-fat dressings. High-fat meats. These include: Hot dogs and sausage. Rib eye steak. Ham and bacon. High-fat dairy items. These include: Whole milk. Butter. Cream cheese. Eat small meals often. Avoid eating big meals. Avoid drinking lots of liquid with your meals. Try not to eat meals during the 2-3 hours before bedtime. Try not to lie down right after you eat. Do not exercise right after you eat. Lifestyle  If you're overweight, lose an amount of weight that's healthy for you. Ask your provider about a safe weight loss goal. Do not smoke, vape, or use nicotine or tobacco. These can make your symptoms worse. If you need help quitting, talk with your  provider. Wear loose clothes. Do not wear things that are tight around your waist. When you sleep, try: Raising the head of your bed about 6 inches (15 cm). You can use a wedge to do this. Lying down on your left side. Try to lower your stress. If you need help doing this, ask your provider. Medicines Take your medicines only as told by your provider. Do not take aspirin  or ibuprofen unless you're told to. Stop medicines only as told by your provider. If you stop taking some medicines too quickly, your symptoms may get worse. General instructions Watch for any changes in your symptoms. Contact a health care provider if: You have new symptoms. You have weight loss for no known reason. You have trouble swallowing. It hurts to swallow. You have wheezing. This is when you make high-pitched whistling sounds when you breathe, most often when you breathe out. You have a cough that won't go away. Your symptoms don't get better with treatment. You have heartburn often for more than 2 weeks. Get help right away if: You have pain all of a sudden in your: Arms. Neck. Jaw. Teeth. Back. You feel sweaty, dizzy, or light-headed all of a sudden. You have chest pain or shortness of breath. You vomit and your vomit looks like blood or coffee grounds. Your poop is bloody or black. These symptoms may be an emergency. Call 911 right away. Do not wait to see if the symptoms will go away. Do not drive yourself to the hospital. This information is not intended to replace advice given to you by your health care provider. Make sure you  discuss any questions you have with your health care provider. Document Revised: 10/06/2023 Document Reviewed: 04/17/2023 Elsevier Patient Education  2024 [employer].

## 2024-10-11 LAB — COMPLETE METABOLIC PANEL WITHOUT GFR
AG Ratio: 1.9 (calc) (ref 1.0–2.5)
ALT: 18 U/L (ref 9–46)
AST: 14 U/L (ref 10–35)
Albumin: 4.1 g/dL (ref 3.6–5.1)
Alkaline phosphatase (APISO): 80 U/L (ref 35–144)
BUN: 23 mg/dL (ref 7–25)
CO2: 29 mmol/L (ref 20–32)
Calcium: 8.9 mg/dL (ref 8.6–10.3)
Chloride: 105 mmol/L (ref 98–110)
Creat: 0.87 mg/dL (ref 0.70–1.30)
Globulin: 2.2 g/dL (ref 1.9–3.7)
Glucose, Bld: 98 mg/dL (ref 65–99)
Potassium: 4.4 mmol/L (ref 3.5–5.3)
Sodium: 143 mmol/L (ref 135–146)
Total Bilirubin: 0.5 mg/dL (ref 0.2–1.2)
Total Protein: 6.3 g/dL (ref 6.1–8.1)

## 2024-10-11 MED ORDER — VITAMIN D (ERGOCALCIFEROL) 1.25 MG (50000 UNIT) PO CAPS: 50000.0000 [IU] | ORAL_CAPSULE | ORAL | 4 refills | Status: DC

## 2024-10-12 NOTE — Addendum Note (Signed)
 Addended by: TRUDY CURVIN RAMAN on: 10/12/2024 03:20 PM   Modules accepted: Orders

## 2024-10-15 ENCOUNTER — Encounter: Payer: Self-pay | Admitting: Family Medicine

## 2024-10-17 ENCOUNTER — Other Ambulatory Visit: Payer: Self-pay | Admitting: Family Medicine

## 2024-10-17 MED ORDER — DESVENLAFAXINE SUCCINATE ER 50 MG PO TB24: 50.0000 mg | ORAL_TABLET | Freq: Two times a day (BID) | ORAL | 3 refills | Status: DC

## 2024-10-18 ENCOUNTER — Encounter (HOSPITAL_COMMUNITY): Payer: Self-pay

## 2024-10-20 ENCOUNTER — Ambulatory Visit

## 2024-10-20 ENCOUNTER — Ambulatory Visit
Admission: RE | Admit: 2024-10-20 | Discharge: 2024-10-20 | Disposition: A | Discharge status: Home / Self Care | Source: Ambulatory Visit | Attending: Cardiovascular Disease | Admitting: Cardiovascular Disease

## 2024-10-20 DIAGNOSIS — K449 Diaphragmatic hernia without obstruction or gangrene: Secondary | ICD-10-CM | POA: Insufficient documentation

## 2024-10-20 DIAGNOSIS — M47814 Spondylosis without myelopathy or radiculopathy, thoracic region: Secondary | ICD-10-CM | POA: Insufficient documentation

## 2024-10-20 DIAGNOSIS — I700 Atherosclerosis of aorta: Secondary | ICD-10-CM | POA: Diagnosis not present

## 2024-10-20 DIAGNOSIS — R079 Chest pain, unspecified: Secondary | ICD-10-CM | POA: Diagnosis present

## 2024-10-20 DIAGNOSIS — K769 Liver disease, unspecified: Secondary | ICD-10-CM | POA: Insufficient documentation

## 2024-10-20 LAB — NM PET CT CARDIAC PERFUSION MULTI W/ABSOLUTE BLOODFLOW
LV dias vol: 140 mL (ref 62–150)
LV sys vol: 50 mL (ref 4.2–5.8)
MBFR: 1.82
Nuc Rest EF: 59 %
Nuc Stress EF: 64 %
Peak HR: 77 {beats}/min
Rest HR: 53 {beats}/min
Rest MBF: 0.61 ml/g/min
Rest Nuclear Isotope Dose: 24.9 mCi
SRS: 0
SSS: 16
ST Depression (mm): 0 mm
Stress MBF: 1.11 ml/g/min
Stress Nuclear Isotope Dose: 25 mCi
TID: 1.01

## 2024-10-20 MED ORDER — RUBIDIUM RB82 GENERATOR (RUBYFILL)
25.0000 | PACK | Freq: Once | INTRAVENOUS | Status: AC
  Administered 2024-10-20: 24.95 via INTRAVENOUS

## 2024-10-20 MED ORDER — REGADENOSON 0.4 MG/5ML IV SOLN
0.4000 mg | Freq: Once | INTRAVENOUS | Status: AC
  Administered 2024-10-20: 0.4 mg via INTRAVENOUS
  Filled 2024-10-20: qty 5

## 2024-10-20 MED ORDER — REGADENOSON 0.4 MG/5ML IV SOLN
INTRAVENOUS | Status: AC
Start: 2024-10-20 — End: 2024-10-20
  Filled 2024-10-20: qty 5

## 2024-10-20 MED ORDER — RUBIDIUM RB82 GENERATOR (RUBYFILL)
25.0000 | PACK | Freq: Once | INTRAVENOUS | Status: AC
  Administered 2024-10-20: 24.94 via INTRAVENOUS

## 2024-10-21 ENCOUNTER — Ambulatory Visit (INDEPENDENT_AMBULATORY_CARE_PROVIDER_SITE_OTHER): Admitting: Psychology

## 2024-10-21 ENCOUNTER — Telehealth: Payer: Self-pay | Admitting: Cardiovascular Disease

## 2024-10-21 ENCOUNTER — Ambulatory Visit: Payer: Self-pay | Admitting: Cardiovascular Disease

## 2024-10-21 DIAGNOSIS — F331 Major depressive disorder, recurrent, moderate: Secondary | ICD-10-CM | POA: Diagnosis not present

## 2024-10-21 NOTE — Progress Notes (Signed)
 [facility] Counselor/Therapist Progress Note  Patient ID: Keith Morrow, MRN: [id number],    Date: 10/21/2024  Time Spent: 2:00pm-2:55pm  55 minutes   Treatment Type: Individual Therapy  Reported Symptoms: stress, sadness  Mental Status Exam: Appearance:  Casual     Behavior: Appropriate  Motor: Normal  Speech/Language:  Normal Rate  Affect: Appropriate  Mood: normal  Thought process: normal  Thought content:   WNL  Sensory/Perceptual disturbances:   WNL  Orientation: oriented to person, place, time/date, and situation  Attention: Good  Concentration: Good  Memory: WNL  Fund of knowledge:  Good  Insight:   Good  Judgment:  Good  Impulse Control: Good   Risk Assessment: Danger to Self:  No Self-injurious Behavior: No Danger to Others: No Duty to Warn:no Physical Aggression / Violence:No  Access to Firearms a concern: No  Gang Involvement:No   Subjective: Pt present for face-to-face individual therapy via video.  Pt consents to telehealth video session and is aware of limitations and benefits of virtual sessions.  Location of pt: home Location of therapist: home office.   Pt talked about work.  He has to work 100% from the office now which is stressful.  Pt has been working a lot of hours including being on call in the middle of the night. 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  Pt wasn't feeling well at the book fair and had chest pain bc he was in afib.  Pt had to go to the ER and was admitted to the hospital.  He was given tests and told he has a blockage that needs to be repaired.  Pt still has shortness of breath.   He has a follow up with the cardiologist next week. Pt has been thinking about his mortality.   He has thought about what he has to look forward to in life and has not come up with much.  A part of pt thought about how he would prefer to die, either by MS or a heart attack.    He has felt depressed when thinking about this.    Pt saw a psychiatrist to adjust his antidepressant medications.   He is going to be prescribed Prestique.  Pt has had trouble controlling his outbursts and states at times his words can be viscious.  Pt was tearful as he talked about feeling hopeless.  Addressed pt's lability and worked on emotional regulation.   Helped pt identify small things he has to look forward to. Provided supportive therapy.    Interventions: Cognitive Behavioral Therapy and Insight-Oriented  Diagnosis:  F33.1  Plan of Care: Recommend ongoing therapy.  Pt participated in setting treatment goals.  Plan to meet monthly.   Pt agrees with treatment plan.    Treatment Plan  (Treatment Plan Target Date: 04/29/2025) Client Abilities/Strengths  Pt is bright, engaging, and motivated for therapy.   Client Treatment Preferences  Individual therapy.  Client Statement of Needs  Improve coping skills.  Symptoms  Depressed or irritable mood. Feelings of hopelessness, worthlessness, or inappropriate guilt. Low self-esteem.  Problems Addressed  Unipolar Depression Goals 1. Alleviate depressive symptoms and return to previous level of effective functioning. 2. Appropriately grieve the loss in order to normalize mood and to return to previously adaptive level of functioning. Objective Learn and implement behavioral strategies to overcome depression. Target Date: 2025-04-29 Frequency: Monthly  Progress: 38 Modality: individual  Related Interventions Engage the client in behavioral activation, increasing his/her activity level  and contact with sources of reward, while identifying processes that inhibit activation.  Use behavioral techniques such as instruction, rehearsal, role-playing, role reversal, as needed, to facilitate activity in the client's daily life; reinforce success. Assist the client in developing skills that increase the likelihood of deriving pleasure from behavioral activation (e.g., assertiveness skills,  developing an exercise plan, less internal/more external focus, increased social involvement); reinforce success. Objective Identify important people in life, past and present, and describe the quality, good and poor, of those relationships. Target Date: 2025-04-29 Frequency: Monthly  Progress: 65 Modality: individual  Related Interventions Conduct Interpersonal Therapy beginning with the assessment of the client's interpersonal inventory of important past and present relationships; develop a case formulation linking depression to grief, interpersonal role disputes, role transitions, and/or interpersonal deficits). Objective Learn and implement problem-solving and decision-making skills. Target Date: 2025-04-29 Frequency: Monthly  Progress: 65 Modality: individual  Related Interventions Conduct Problem-Solving Therapy using techniques such as psychoeducation, modeling, and role-playing to teach client problem-solving skills (i.e., defining a problem specifically, generating possible solutions, evaluating the pros and cons of each solution, selecting and implementing a plan of action, evaluating the efficacy of the plan, accepting or revising the plan); role-play application of the problem-solving skill to a real life issue. Encourage in the client the development of a positive problem orientation in which problems and solving them are viewed as a natural part of life and not something to be feared, despaired, or avoided. 3. Develop healthy interpersonal relationships that lead to the alleviation and help prevent the relapse of depression. 4. Develop healthy thinking patterns and beliefs about self, others, and the world that lead to the alleviation and help prevent the relapse of depression. 5. Recognize, accept, and cope with feelings of depression. Diagnosis F33.1  Conditions For Discharge Achievement of treatment goals and objectives   Veva Alma, LCSW

## 2024-10-21 NOTE — Telephone Encounter (Signed)
 Patient stated he was returning staff call.

## 2024-10-21 NOTE — Telephone Encounter (Signed)
 Spoke with pt regarding LHC. Pt was offered November 26th. Pt stated he did not want to schedule procedure on that day because he has family coming in town. Pt was scheduled for December 18th at 930 am. Pt was also advised that we may call to offer a sooner date per Dr. Wonda. Pt was asked to have labs and EKG at his next appt with Tessa on 11/26. Instructions sent via MyChart. Pt was advised to call our office if he has any questions.

## 2024-10-21 NOTE — Progress Notes (Signed)
 Agree. thanks

## 2024-10-24 NOTE — Telephone Encounter (Signed)
 Patient stated he feels like he has all his questions answered. Patient has appointment coming up with Orren Fabry, PA and he will get lab work then. Patient thank us  for the call back.

## 2024-10-31 ENCOUNTER — Other Ambulatory Visit: Payer: Self-pay | Admitting: Family Medicine

## 2024-10-31 ENCOUNTER — Other Ambulatory Visit (HOSPITAL_COMMUNITY): Payer: Self-pay

## 2024-10-31 MED ORDER — DESVENLAFAXINE SUCCINATE ER 100 MG PO TB24: 100.0000 mg | ORAL_TABLET | Freq: Every day | ORAL | 1 refills | Status: AC

## 2024-11-01 NOTE — H&P (View-Only) (Signed)
 Cardiology Office Note   Date:  11/02/2024  ID:  Keith Morrow, DOB 05-27-1971, MRN [id number] PCP: Domenica Harlene LABOR, MD   [facility] Providers Cardiologist:  Ozell Fell, MD   History of Present Illness Keith Morrow is a 53 y.o. male with a past medical history of multiple sclerosis, hypertension, hyperlipidemia, and coronary artery disease here for follow-up appointment.  Initially presented in 2012 with non-STEMI and was treated with a stent to the LAD, angioplasty to diagonal, and staged PCI of the posterior AV segment of the RCA.  LVEF was notably preserved.  Patient was seen by Dr. Fell in August and was doing fine from a cardiovascular standpoint at that time without any chest pain/pressure or shortness of breath.  He did endorse chronic leg swelling which improved with use of a new right lymphedema pump.  He reported no cardiac related concerns.  Multiple sclerosis have progressed significantly over the past several years.  He was try to make a decision about whether to proceed with colonoscopy to follow-up on abnormal Cologuard in the context of his current disability and stated multiple sclerosis.  He did end up undergoing a nuclear med PET/CT cardiac perfusion scan which showed evidence of coronary blockage in the right coronary artery which had a stent in place back in 2012.  Was recommended that he undergo cardiac catheterization this has been set up for December.  He can hold his Eliquis  2 days prior to his cardiac catheterization.  Today he  presents with a hx of atrial fibrillation and coronary artery disease who presents for pre-procedural evaluation before a cardiac catheterization.  He is scheduled for a cardiac catheterization on December 18th to further evaluate his coronary artery disease.  He has occasional brief shortness of breath that is sporadic and not exertional, described as needing to catch his breath, without recent palpitations. His heart  rate previously ranged in the low to high 100s but is now around 70, with a recent rate of 58 bpm and normal rates on his watch.  He takes metoprolol  tartrate but has difficulty cutting the tablets to the prescribed dose. He takes Benicar  20 mg daily for hypertension and Lipitor 40 mg, which he has been taking in the morning.  He has daily bilateral foot swelling that resolves overnight with compression socks. He uses Lasix  only as needed, and the edema resolves by morning.  He has multiple sclerosis diagnosed in 1998. He reports a prolonged severe mood reaction to Benadryl, with anger and loss of emotional control lasting 1 to 2 weeks after use.  This was added to his allergy list.  Reports no chest pain, pressure, or tightness. No edema, orthopnea, PND. Reports no palpitations.    Discussed the use of AI scribe software for clinical note transcription with the patient, who gave verbal consent to proceed.   ROS: Pertinent ROS in HPI  Studies Reviewed EKG Interpretation Date/Time:  Wednesday November 02 2024 08:32:48 EST Ventricular Rate:  59 PR Interval:  152 QRS Duration:  88 QT Interval:  450 QTC Calculation: 445 R Axis:   32  Text Interpretation: Sinus bradycardia When compared with ECG of 07-Oct-2024 11:34, Criteria for Septal infarct are no longer Present Confirmed by Lucien Blanc [phone]) on 11/02/2024 9:13:44 AM   NM PET/CT cardiac perfusion 10/20/24 Narrative & Impression      LV perfusion is abnormal. Defect 1: There is a medium defect with severe reduction in uptake present in the apical to basal inferior and inferoseptal  location(s) that is reversible. There is abnormal wall motion in the defect area. Consistent with ischemia.   Rest left ventricular function is normal. Rest EF: 59%. Stress left ventricular function is normal. Stress EF: 64%. End diastolic cavity size is normal. End systolic cavity size is normal.   Myocardial blood flow was computed to be 0.61ml/g/min at  rest and 1.11ml/g/min at stress. Global myocardial blood flow reserve was 1.82 and was mildly abnormal.   Coronary calcium  assessment not performed due to prior revascularization.  Trivial pericardial effusion versus pericardial thickening is incidentally noted.   Findings are consistent with ischemia in the RCA territory. The study is intermediate risk.   Electronically signed by: Lonni Hanson, MD.     Risk Assessment/Calculations  CHA2DS2-VASc Score = 2  This indicates a 2.2% annual risk of stroke. The patient's score is based upon: CHF History: 0 HTN History: 1 Diabetes History: 0 Stroke History: 0 Vascular Disease History: 1 Age Score: 0 Gender Score: 0   Physical Exam VS:  BP (!) 106/58   Pulse (!) 58   Ht 6' 3 (1.905 m)   Wt (!) 340 lb (154.2 kg)   SpO2 97%   BMI 42.50 kg/m        Wt Readings from Last 3 Encounters:  11/02/24 (!) 340 lb (154.2 kg)  10/10/24 (!) 334 lb 12.8 oz (151.9 kg)  10/06/24 (!) 345 lb (156.5 kg)    GEN: Well nourished, well developed in no acute distress NECK: No JVD; No carotid bruits CARDIAC: RRR, no murmurs, rubs, gallops RESPIRATORY:  Clear to auscultation without rales, wheezing or rhonchi  ABDOMEN: Soft, non-tender, non-distended EXTREMITIES:  No edema; No deformity   ASSESSMENT AND PLAN  Atherosclerotic heart disease of native coronary artery with reversible ischemia Medium defect with severe reduction in uptake present in the apical to basal inferior and inferior septal locations suggests reversible ischemia. Normal ejection fraction at 64%. PET CT shows small hiatal hernia, stable hepatic lesions, and aortic atherosclerosis. Cardiac catheterization planned to evaluate for significant stenosis and potential stenting. - Ordered cardiac catheterization on December 18th, 2025. - Instructed to hold Eliquis  on December 16th and 17th, 2025, with last dose on December 15th, 2025. - Restart Eliquis  post-procedure as advised. - Monitor  kidney function post-procedure due to contrast use. CBC and BMP today - Schedule follow-up appointment one week post-procedure. - Consider cardiac rehab post-procedure if stent is placed.  Paroxysmal atrial fibrillation No recent AFib episodes. Heart rate controlled at 58 bpm. - Continue current management and monitor for symptoms.  Essential hypertension Blood pressure management complicated by medication error. Current regimen includes Benicar  and metoprolol . Heart rate at 58 bpm. Discussed switching to metoprolol  succinate for ease of dosing. - Switched to metoprolol  succinate 25 mg once daily. - Monitor blood pressure and heart rate.  Mixed hyperlipidemia LDL at 66 mg/dL, above target of <44 mg/dL. Other lipids normal. On Lipitor 40 mg, potential dose increase post-procedure. - Continue Lipitor 40 mg at night. - Reassess lipid levels post-procedure.  Lower extremity edema Daily swelling of feet and ankles, resolves with compression socks. No current use of Lasix . - Continue use of compression socks as needed. - He does have as needed Lasix  to use as needed  Shortness of breath Intermittent shortness of breath, possibly angina-related, improves with cold water  and chalk pills. - Reassess post-cardiac catheterization.  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 - Added Benadryl to allergy list with note  of mood reaction. - Avoid Benadryl in future treatments.    Informed Consent   Shared Decision Making/Informed Consent The risks [stroke (1 in 1000), death (1 in 1000), kidney failure [usually temporary] (1 in 500), bleeding (1 in 200), allergic reaction [possibly serious] (1 in 200)], benefits (diagnostic support and management of coronary artery disease) and alternatives of a cardiac catheterization were discussed in detail with Mr. Bodkin and he is willing to proceed.     Dispo: He can follow-up a week  postcardiac catheterization with APP and in 3 months with Dr. Wonda  Signed, Orren LOISE Fabry, PA-C

## 2024-11-01 NOTE — Progress Notes (Unsigned)
 Cardiology Office Note   Date:  11/02/2024  ID:  Keith Morrow, DOB [birth year], MRN [id number] PCP: Domenica Harlene LABOR, MD  [facility] Providers Cardiologist:  Ozell Fell, MD   History of Present Illness Keith Morrow is a 53 y.o. male with a past medical history of multiple sclerosis, hypertension, hyperlipidemia, and coronary artery disease here for follow-up appointment.  Initially presented in 2012 with non-STEMI and was treated with a stent to the LAD, angioplasty to diagonal, and staged PCI of the posterior AV segment of the RCA.  LVEF was notably preserved.  Patient was seen by Dr. Fell in August and was doing fine from a cardiovascular standpoint at that time without any chest pain/pressure or shortness of breath.  He did endorse chronic leg swelling which improved with use of a new right lymphedema pump.  He reported no cardiac related concerns.  Multiple sclerosis have progressed significantly over the past several years.  He was try to make a decision about whether to proceed with colonoscopy to follow-up on abnormal Cologuard in the context of his current disability and stated multiple sclerosis.  He did end up undergoing a nuclear med PET/CT cardiac perfusion scan which showed evidence of coronary blockage in the right coronary artery which had a stent in place back in 2012.  Was recommended that he undergo cardiac catheterization this has been set up for December.  He can hold his Eliquis  2 days prior to his cardiac catheterization.  Today he  presents with a hx of atrial fibrillation and coronary artery disease who presents for pre-procedural evaluation before a cardiac catheterization.  He is scheduled for a cardiac catheterization on December 18th to further evaluate his coronary artery disease.  He has occasional brief shortness of breath that is sporadic and not exertional, described as needing to "catch" his breath, without recent palpitations. His heart  rate previously ranged in the low to high 100s but is now around 70, with a recent rate of 58 bpm and normal rates on his watch.  He takes metoprolol  tartrate but has difficulty cutting the tablets to the prescribed dose. He takes Benicar  20 mg daily for hypertension and Lipitor 40 mg, which he has been taking in the morning.  He has daily bilateral foot swelling that resolves overnight with compression socks. He uses Lasix  only as needed, and the edema resolves by morning.  He has multiple sclerosis diagnosed in 1998. He reports a prolonged severe mood reaction to Benadryl, with anger and loss of emotional control lasting 1 to 2 weeks after use.  This was added to his allergy list.  Reports no chest pain, pressure, or tightness. No edema, orthopnea, PND. Reports no palpitations.    Discussed the use of AI scribe software for clinical note transcription with the patient, who gave verbal consent to proceed.   ROS: Pertinent ROS in HPI  Studies Reviewed EKG Interpretation Date/Time:  Wednesday November 02 2024 08:32:48 EST Ventricular Rate:  59 PR Interval:  152 QRS Duration:  88 QT Interval:  450 QTC Calculation: 445 R Axis:   32  Text Interpretation: Sinus bradycardia When compared with ECG of 07-Oct-2024 11:34, Criteria for Septal infarct are no longer Present Confirmed by Lucien Blanc [phone]) on 11/02/2024 9:13:44 AM   NM PET/CT cardiac perfusion 10/20/24 Narrative & Impression      LV perfusion is abnormal. Defect 1: There is a medium defect with severe reduction in uptake present in the apical to basal inferior and inferoseptal  location(s) that is reversible. There is abnormal wall motion in the defect area. Consistent with ischemia.   Rest left ventricular function is normal. Rest EF: 59%. Stress left ventricular function is normal. Stress EF: 64%. End diastolic cavity size is normal. End systolic cavity size is normal.   Myocardial blood flow was computed to be 0.61ml/g/min at  rest and 1.11ml/g/min at stress. Global myocardial blood flow reserve was 1.82 and was mildly abnormal.   Coronary calcium  assessment not performed due to prior revascularization.  Trivial pericardial effusion versus pericardial thickening is incidentally noted.   Findings are consistent with ischemia in the RCA territory. The study is intermediate risk.   Electronically signed by: Lonni Hanson, MD.     Risk Assessment/Calculations  CHA2DS2-VASc Score = 2  This indicates a 2.2% annual risk of stroke. The patient's score is based upon: CHF History: 0 HTN History: 1 Diabetes History: 0 Stroke History: 0 Vascular Disease History: 1 Age Score: 0 Gender Score: 0   Physical Exam VS:  BP (!) 106/58   Pulse (!) 58   Ht 6' 3 (1.905 m)   Wt (!) 340 lb (154.2 kg)   SpO2 97%   BMI 42.50 kg/m        Wt Readings from Last 3 Encounters:  11/02/24 (!) 340 lb (154.2 kg)  10/10/24 (!) 334 lb 12.8 oz (151.9 kg)  10/06/24 (!) 345 lb (156.5 kg)    GEN: Well nourished, well developed in no acute distress NECK: No JVD; No carotid bruits CARDIAC: RRR, no murmurs, rubs, gallops RESPIRATORY:  Clear to auscultation without rales, wheezing or rhonchi  ABDOMEN: Soft, non-tender, non-distended EXTREMITIES:  No edema; No deformity   ASSESSMENT AND PLAN  Atherosclerotic heart disease of native coronary artery with reversible ischemia Medium defect with severe reduction in uptake present in the apical to basal inferior and inferior septal locations suggests reversible ischemia. Normal ejection fraction at 64%. PET CT shows small hiatal hernia, stable hepatic lesions, and aortic atherosclerosis. Cardiac catheterization planned to evaluate for significant stenosis and potential stenting. - Ordered cardiac catheterization on December 18th, 2025. - Instructed to hold Eliquis  on December 16th and 17th, 2025, with last dose on December 15th, 2025. - Restart Eliquis  post-procedure as advised. - Monitor  kidney function post-procedure due to contrast use. CBC and BMP today - Schedule follow-up appointment one week post-procedure. - Consider cardiac rehab post-procedure if stent is placed.  Paroxysmal atrial fibrillation No recent AFib episodes. Heart rate controlled at 58 bpm. - Continue current management and monitor for symptoms.  Essential hypertension Blood pressure management complicated by medication error. Current regimen includes Benicar  and metoprolol . Heart rate at 58 bpm. Discussed switching to metoprolol  succinate for ease of dosing. - Switched to metoprolol  succinate 25 mg once daily. - Monitor blood pressure and heart rate.  Mixed hyperlipidemia LDL at 66 mg/dL, above target of <44 mg/dL. Other lipids normal. On Lipitor 40 mg, potential dose increase post-procedure. - Continue Lipitor 40 mg at night. - Reassess lipid levels post-procedure.  Lower extremity edema Daily swelling of feet and ankles, resolves with compression socks. No current use of Lasix . - Continue use of compression socks as needed. - He does have as needed Lasix  to use as needed  Shortness of breath Intermittent shortness of breath, possibly angina-related, improves with cold water and chalk pills. - Reassess post-cardiac catheterization.  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 - Added Benadryl to allergy list with note  of mood reaction. - Avoid Benadryl in future treatments.    Informed Consent   Shared Decision Making/Informed Consent The risks [stroke (1 in 1000), death (1 in 1000), kidney failure [usually temporary] (1 in 500), bleeding (1 in 200), allergic reaction [possibly serious] (1 in 200)], benefits (diagnostic support and management of coronary artery disease) and alternatives of a cardiac catheterization were discussed in detail with Mr. Dortch and he is willing to proceed.     Dispo: He can follow-up a week  postcardiac catheterization with APP and in 3 months with Dr. Wonda  Signed, Orren LOISE Fabry, PA-C

## 2024-11-01 NOTE — Telephone Encounter (Signed)
 Patient was advised and stated if he needs to half the dose he will just cut them in half if possible.

## 2024-11-02 ENCOUNTER — Ambulatory Visit: Admitting: Neurology

## 2024-11-02 ENCOUNTER — Ambulatory Visit: Attending: Physician Assistant | Admitting: Physician Assistant

## 2024-11-02 ENCOUNTER — Encounter: Payer: Self-pay | Admitting: Neurology

## 2024-11-02 ENCOUNTER — Encounter: Payer: Self-pay | Admitting: Physician Assistant

## 2024-11-02 ENCOUNTER — Telehealth: Payer: Self-pay | Admitting: *Deleted

## 2024-11-02 VITALS — BP 108/70 | HR 60 | Resp 15 | Ht 75.0 in

## 2024-11-02 VITALS — BP 106/58 | HR 58 | Ht 75.0 in | Wt 340.0 lb

## 2024-11-02 DIAGNOSIS — G35C1 Active secondary progressive multiple sclerosis: Secondary | ICD-10-CM | POA: Diagnosis not present

## 2024-11-02 DIAGNOSIS — I2510 Atherosclerotic heart disease of native coronary artery without angina pectoris: Secondary | ICD-10-CM | POA: Diagnosis not present

## 2024-11-02 DIAGNOSIS — E782 Mixed hyperlipidemia: Secondary | ICD-10-CM | POA: Diagnosis not present

## 2024-11-02 DIAGNOSIS — R3915 Urgency of urination: Secondary | ICD-10-CM | POA: Diagnosis not present

## 2024-11-02 DIAGNOSIS — G801 Spastic diplegic cerebral palsy: Secondary | ICD-10-CM

## 2024-11-02 DIAGNOSIS — R079 Chest pain, unspecified: Secondary | ICD-10-CM

## 2024-11-02 DIAGNOSIS — I10 Essential (primary) hypertension: Secondary | ICD-10-CM | POA: Diagnosis not present

## 2024-11-02 DIAGNOSIS — R269 Unspecified abnormalities of gait and mobility: Secondary | ICD-10-CM | POA: Diagnosis not present

## 2024-11-02 DIAGNOSIS — Z79899 Other long term (current) drug therapy: Secondary | ICD-10-CM

## 2024-11-02 DIAGNOSIS — I480 Paroxysmal atrial fibrillation: Secondary | ICD-10-CM

## 2024-11-02 DIAGNOSIS — Z95828 Presence of other vascular implants and grafts: Secondary | ICD-10-CM

## 2024-11-02 LAB — CBC

## 2024-11-02 MED ORDER — APIXABAN 5 MG PO TABS: 5.0000 mg | ORAL_TABLET | Freq: Two times a day (BID) | ORAL | 3 refills | Status: AC

## 2024-11-02 MED ORDER — METOPROLOL SUCCINATE ER 25 MG PO TB24: 25.0000 mg | ORAL_TABLET | Freq: Every day | ORAL | 3 refills | Status: AC

## 2024-11-02 MED ORDER — METHYLPHENIDATE HCL ER (OSM) 54 MG PO TBCR: 54.0000 mg | EXTENDED_RELEASE_TABLET | ORAL | 0 refills | Status: DC

## 2024-11-02 MED ORDER — METOPROLOL TARTRATE 25 MG PO TABS: 12.5000 mg | ORAL_TABLET | Freq: Two times a day (BID) | ORAL | 3 refills | Status: DC

## 2024-11-02 NOTE — Addendum Note (Signed)
 Addended by: Zyanna Leisinger C on: 11/02/2024 11:11 AM   Modules accepted: Orders

## 2024-11-02 NOTE — Telephone Encounter (Signed)
 Spoke with pharmacy representative. Advised that provider has changed patient's metoprolol  tartrate to metoprolol  succinate (25mg  daily) and that metoprolol  tartrate should not be filled. Representative verbalized understanding and stated that this has been noted in their system.

## 2024-11-02 NOTE — Patient Instructions (Addendum)
 Medication Instructions:  Your physician recommends that you continue on your current medications as directed. Please refer to the Current Medication list given to you today. *If you need a refill on your cardiac medications before your next appointment, please call your pharmacy*  Lab Work: TODAY-BMET & CBC If you have labs (blood work) drawn today and your tests are completely normal, you will receive your results only by: MyChart Message (if you have MyChart) OR A paper copy in the mail If you have any lab test that is abnormal or we need to change your treatment, we will call you to review the results.  Testing/Procedures: Your physician has requested that you have a cardiac catheterization. Cardiac catheterization is used to diagnose and/or treat various heart conditions. Doctors may recommend this procedure for a number of different reasons. The most common reason is to evaluate chest pain. Chest pain can be a symptom of coronary artery disease (CAD), and cardiac catheterization can show whether plaque is narrowing or blocking your heart's arteries. This procedure is also used to evaluate the valves, as well as measure the blood flow and oxygen levels in different parts of your heart. For further information please visit [email or web address] Please follow instruction sheet, as given.  Follow-Up: At [facility], you and your health needs are our priority.  As part of our continuing mission to provide you with exceptional heart care, our providers are all part of one team.  This team includes your primary Cardiologist (physician) and Advanced Practice Providers or APPs (Physician Assistants and Nurse Practitioners) who all work together to provide you with the care you need, when you need it.  Your next appointment:   4 week(s)  Provider:   Orren Fabry, PA-C      Then, Ozell Fell, MD will plan to see you again in 4 month(s).    We recommend signing up for the patient portal  called MyChart.  Sign up information is provided on this After Visit Summary.  MyChart is used to connect with patients for Virtual Visits (Telemedicine).  Patients are able to view lab/test results, encounter notes, upcoming appointments, etc.  Non-urgent messages can be sent to your provider as well.   To learn more about what you can do with MyChart, go to [email or web address].   Other Instructions         Cardiac Catheterization   You are scheduled for a Cardiac Catheterization on Thursday, December 18 with Dr. Ozell Fell.  1. Please arrive at the [facility] (Main Entrance A) at [facility]: [address] [place], [place] [address] at 7:30 AM (This time is 2 hour(s) before your procedure to ensure your preparation).   Free valet parking service is available. You will check in at ADMITTING. The support person will be asked to wait in the waiting room.  It is OK to have someone drop you off and come back when you are ready to be discharged.        Special note: Every effort is made to have your procedure done on time. Please understand that emergencies sometimes delay scheduled procedures.  2. Diet: Nothing to eat after midnight.  3. Hydration:You need to be well hydrated before your procedure. On December 18, you may drink approved liquids (see below) until 2 hours before the procedure, with 16 oz of water as your last intake.   List of approved liquids water, clear juice, clear tea, black coffee, fruit juices, non-citric and without pulp, carbonated beverages, Gatorade,  Kool -Aid, plain Jello-O and plain ice popsicles.  4. Labs: You will need to have blood drawn on Wednesday, November 26 at [facility] (1st Floor), [address] [place], [place] [address]. You do not need to be fasting.  5. Medication instructions in preparation for your procedure:   Contrast Allergy: No  Stop taking Eliquis  (Apixiban) on Monday,  December 15. (AFTER EVENING DOSE HOLD MEDICATION)  Stop taking, Lasix  (Furosemide ) Thursday, December 18, (HOLD DOSE THAT MORNING OF THE CATH)  On the morning of your procedure, take Aspirin  81 mg and any morning medicines NOT listed above.  You may use sips of water.  6. Plan to go home the same day, you will only stay overnight if medically necessary.  7. You MUST have a responsible adult to drive you home.  8. An adult MUST be with you the first 24 hours after you arrive home.  9. Bring a current list of your medications, and the last time and date medication taken.  10. Bring ID and current insurance cards.  11.Please wear clothes that are easy to get on and off and wear slip-on shoes.  Thank you for allowing us  to care for you!   -- [facility]

## 2024-11-02 NOTE — Progress Notes (Signed)
 [name]     HPI     Multiple Sclerosis    Additional comments: Rm10, alone, Ms follow up      Last edited by Oneita Hoist, CMA on 11/02/2024 11:22 AM.        Chief Complaint   Multiple Sclerosis      HISTORY:  Keith Morrow is a 53 y.o. man with a relapsing form of multiple sclerosis who has right greater than left leg weakness and progressive difficulties with gait function.  Due to severe spasticity, a baclofen  pump was placed in 2013 and replaced 09/2020.  Update 11/02/2024: He is on Tysabri and tolerates it well.   No exacerbations.  However, he has progressed further.   No w having more difficulty with transfers - needs help to get to the toilet and to transfer from wheelchair.    He will sometimes get some lg apin while sitting in his chair.  He is noting more issues with his right hand.  The hand wants to flex more than it used to so he actively straightens and extends fingers  He has a baclofen  pump.  He is now using [facility] to fill the pump.  Mild reduction of the dose did not improve transfers.  He takes a bolus at nght with benefit.    Clonazepam  has helped the spasticity in the arms and torso.    His main problem is weakness and spasticity.   He often feels too tight in the mornings and too loose in the afternoons.  When this happens, he can't transfer.    His feet sometimes hurt as the day goes on.   The hydromorphone  helped this more initially but this is less effective recently  Fatigue is worse and he often takes ritalin  20-10-10,     His major impairment is reduced gait due to the weakness in his legs.  Transferring has been more difficult.  He notes more stiffness in his right hand.  Typing is slower.   He spends most of the day in the chair.    He has hand controls on his fleeta.  He is more hesitant to go further away.     Due to slow progression of weakness and reduced coordination in the right arm/hand.  He now writes and types dominantly with the left hand  and just uses the right hand to operate a mouse.    Sensation is generally the same.     Vision is fine.   Bladder function is fine on tamsulosin .  No UTI.      He gets some back and flank pain while driving.    The bottom of his feet hurt more as the day goes on.     Ritalin  helps the MS related ADD/cgnitive/fatigue issues.    However, it wears off in the afternoon (doing 20-10-10)  He sleeps well at night.  He sleeps better with CPAP.   Mood is doing reasonably well.     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      He has CAD and AFib and was placed on Eliquis .     MS history:  In 1997, he had left optic neuritis and then had numbness in both feet. MRI was consistent with MS and a lumbar puncture was also performed and CSF was consistent with MS. Initially, he was placed on Copaxone. He had difficulties with compliance and 5 years later switched to Avonex. Unfortunate, he had difficulties tolerating Avonex and also had difficulty  tolerating Rebif. In 2004, he started Tysabri but was only able to take 2 doses before was taken off the market. He restarted in 2005 and has been on Tysabri since then with the exception of a 6 month holiday. Tolerates Tysabri well. He has not had any definite exacerbations though his gait has worsened over the past few years. He is JCV antibody negative.   He had recent MRI of the brain and spine and there were no changes.      Spasticity history: He was experiencing progressive spasticity in both legs.  He got a baclofen  pump in 2013.  Prior to the pump he was having severe tonic spasticity as well as frequent phasic spasms.  These improved.  Due to the spasticity, baclofen  was increased in 2016 and 2017 but he became weaker.  I began to see him again and we added clonidine to the baclofen  and cut his dose and added nighttime boluses.  He did better with those settings.  Imaging: MRI of the cervical spine dated 07/25/2020 and  MRI of the cervical spine from  07/05/2015.  Both showed multiple T2 hyperintense foci within the spinal cord.  They are located posterolaterally to the right below the cervicomedullary junction, anteriorly adjacent to C3C4,  posterolaterally to the left adjacent to C4, posterior adjacent to C4-C5, both the left and the right adjacent to C4-C5, very large right focus adjacent to C5, posterior laterally to the left adjacent to C6, towards the right adjacent to C6 and posterior laterally to the right adjacent to C7-T1.  None of the foci enhance.  There did not appear to be any new lesions between 2016 and 2021.   There is a right disc protrusion causing mild to moderate right foraminal narrowing.  At C5-C6, there is a right disc protrusion but no nerve root compression  The MRI of the brain from 07/25/2020 is unchanged compared to the 12/19/2015 MRI according to the official report.  Most foci are supratentorial but somewhat noted in the pons.    There are no enhancing lesions.    PHYSICAL EXAM  There were no vitals filed for this visit.  There is no height or weight on file to calculate BMI.   General: The patient is well-developed and well-nourished and in no acute distress.  Extremities:   There are no rashes or edema.    Neurologic Exam  Mental status: The patient is alert and oriented x 3 at the time of the examination.  Good focus/attention.    Speech is normal.  CN:   Extraocular muscles are normal.  No ptosis.    Symmetric facial strength and sensation.    PE/TP midline  Motor:  Muscle bulk is normal.  tone is increased in the right arm and minimally increased in the legs.  Strength is 4+/5 in the right arm, 5/5 in the left arm, 2/5 in the right leg and 2+/5 in the left leg .   He has reduced RAM in right hand  Sensation: He had symmetric sensation to touch and vibration today.  Gait and station: He is wheelchair-bound  Reflexes: Deep tendon reflexes are trace at the left knee and trace at the right knee.   No ankle  clonus  ASSESSMENT  Active secondary progressive multiple sclerosis  Spastic diplegia (HCC)  Gait disturbance  Urinary urgency  High risk medication use  Presence of implanted infusion pump   PLAN: 1.   He will continue ontinue Tysabri every 4 weeks (uses [facility]).   Check JCV antibody  today.  He has been JCV negative.  If he becomes middle or high positive titer JCV positive we would need to consider a different treatment.  I have discussed that there or some oral medications that could be out in the next couple years that might help progression more than current medications. 2.   He will continue with the intrathecal pump.  This will be refilled with [facility]: 1800 mcg/mL baclofen  and 420 mcg/mL clonidine and hydromorphone  (currently 400 mcg/mL and we will increase to 500 mcg to mL).  He has had more difficulties with his gait and transfers.  The baclofen  dose was back to move mildly but this did not improve the transfers  3.   Continue other med's.  I will change the methylphenidate  4 x 10 mg to methylphenidate  ER 54 mg once a day   .  Refills will be sent in. 4.   Return in 6 months or sooner if there are new or worsening neurologic issues.  We can make further adjustments if needed before the next refill     Rasheed Welty A. Vear, MD, PhD, FAAN Certified in Neurology, Clinical Neurophysiology, Sleep Medicine, Pain Medicine and Neuroimaging Director, Multiple Sclerosis Center at [facility]  [facility] [address], [address] [place], [place] [address] [phone]

## 2024-11-03 LAB — CBC
Hematocrit: 39.9 % (ref 37.5–51.0)
Hemoglobin: 12.9 g/dL — AB (ref 13.0–17.7)
MCH: 29.1 pg (ref 26.6–33.0)
MCHC: 32.3 g/dL (ref 31.5–35.7)
MCV: 90 fL (ref 79–97)
Platelets: 203 x10E3/uL (ref 150–450)
RBC: 4.44 x10E6/uL (ref 4.14–5.80)
RDW: 13.8 % (ref 11.6–15.4)
WBC: 8.4 x10E3/uL (ref 3.4–10.8)

## 2024-11-03 LAB — BASIC METABOLIC PANEL WITH GFR
BUN/Creatinine Ratio: 19 (ref 9–20)
BUN: 18 mg/dL (ref 6–24)
CO2: 25 mmol/L (ref 20–29)
Calcium: 9.6 mg/dL (ref 8.7–10.2)
Chloride: 102 mmol/L (ref 96–106)
Creatinine, Ser: 0.95 mg/dL (ref 0.76–1.27)
Glucose: 96 mg/dL (ref 70–99)
Potassium: 4.4 mmol/L (ref 3.5–5.2)
Sodium: 141 mmol/L (ref 134–144)
eGFR: 97 mL/min/1.73 (ref >59)

## 2024-11-07 ENCOUNTER — Telehealth: Payer: Self-pay | Admitting: Neurology

## 2024-11-07 NOTE — Telephone Encounter (Signed)
 [place] from [facility] is asking that for pt's tysabri updated clinical progress notes be faxed to them at [phone]

## 2024-11-08 LAB — STRATIFY JCV(TM) AB W/INDEX
JCV Antibody: NEGATIVE
JCV Index Value: 0.14

## 2024-11-10 ENCOUNTER — Ambulatory Visit: Payer: Self-pay | Admitting: Physician Assistant

## 2024-11-13 ENCOUNTER — Encounter: Payer: Self-pay | Admitting: Family Medicine

## 2024-11-14 ENCOUNTER — Other Ambulatory Visit: Payer: Self-pay | Admitting: Family

## 2024-11-14 DIAGNOSIS — E559 Vitamin D deficiency, unspecified: Secondary | ICD-10-CM

## 2024-11-14 MED ORDER — VITAMIN D (ERGOCALCIFEROL) 1.25 MG (50000 UNIT) PO CAPS: 50000.0000 [IU] | ORAL_CAPSULE | ORAL | 1 refills | Status: AC

## 2024-11-18 ENCOUNTER — Ambulatory Visit: Admitting: Psychology

## 2024-11-18 ENCOUNTER — Telehealth: Payer: Self-pay | Admitting: Cardiovascular Disease

## 2024-11-18 DIAGNOSIS — Z0279 Encounter for issue of other medical certificate: Secondary | ICD-10-CM

## 2024-11-18 DIAGNOSIS — F331 Major depressive disorder, recurrent, moderate: Secondary | ICD-10-CM

## 2024-11-18 NOTE — Progress Notes (Signed)
 [facility] Counselor/Therapist Progress Note  Patient ID: ROOSEVELT EIMERS, MRN: [id number],    Date: 11/18/2024  Time Spent: 2:00pm-2:50pm  50 minutes   Treatment Type: Individual Therapy  Reported Symptoms: stress, sadness  Mental Status Exam: Appearance:  Casual     Behavior: Appropriate  Motor: Normal  Speech/Language:  Normal Rate  Affect: Appropriate  Mood: normal  Thought process: normal  Thought content:   WNL  Sensory/Perceptual disturbances:   WNL  Orientation: oriented to person, place, time/date, and situation  Attention: Good  Concentration: Good  Memory: WNL  Fund of knowledge:  Good  Insight:   Good  Judgment:  Good  Impulse Control: Good   Risk Assessment: Danger to Self:  No Self-injurious Behavior: No Danger to Others: No Duty to Warn:no Physical Aggression / Violence:No  Access to Firearms a concern: No  Gang Involvement:No   Subjective: Pt present for face-to-face individual therapy via video.  Pt consents to telehealth video session and is aware of limitations and benefits of virtual sessions.  Location of pt: home Location of therapist: home office.   Pt talked about his health.  He will have a cardiac cath on December 18th.  Addressed how pt feels about the upcoming surgery.   He is thinking about the risks.  When his mother had a cardiac cath she then had to have open heart surgery.  He is working on doing a living will before the surgery and he is not sure if he wants to have any procedure other than the cath and a possible stent.  There is a part of pt that doesn't wants to survive the surgery bc he feels like he is a burden to his family bc of his MS.  Helped pt process his feelings.   Pt is thinking about end of life issues.  He was tearful as he talked about feeling sad about making plans for if he dies.   Pt is also in the middle of a medication adjustment.  He has been on Prestique for a week and pt is having some adjustment  issues.   He has had a couple of outbursts.   Pt was tearful as he talked about feeling hopeless.  Pt plans to talk to his family about how he is feeling so he can get their support.   Provided supportive therapy.    Interventions: Cognitive Behavioral Therapy and Insight-Oriented  Diagnosis:  F33.1  Plan of Care: Recommend ongoing therapy.  Pt participated in setting treatment goals.  Plan to meet monthly.   Pt agrees with treatment plan.    Treatment Plan  (Treatment Plan Target Date: 04/29/2025) Client Abilities/Strengths  Pt is bright, engaging, and motivated for therapy.   Client Treatment Preferences  Individual therapy.  Client Statement of Needs  Improve coping skills.  Symptoms  Depressed or irritable mood. Feelings of hopelessness, worthlessness, or inappropriate guilt. Low self-esteem.  Problems Addressed  Unipolar Depression Goals 1. Alleviate depressive symptoms and return to previous level of effective functioning. 2. Appropriately grieve the loss in order to normalize mood and to return to previously adaptive level of functioning. Objective Learn and implement behavioral strategies to overcome depression. Target Date: 2025-04-29 Frequency: Monthly  Progress: 83 Modality: individual  Related Interventions Engage the client in behavioral activation, increasing his/her activity level and contact with sources of reward, while identifying processes that inhibit activation.  Use behavioral techniques such as instruction, rehearsal, role-playing, role reversal, as needed, to facilitate activity in the client's  daily life; reinforce success. Assist the client in developing skills that increase the likelihood of deriving pleasure from behavioral activation (e.g., assertiveness skills, developing an exercise plan, less internal/more external focus, increased social involvement); reinforce success. Objective Identify important people in life, past and present, and describe the  quality, good and poor, of those relationships. Target Date: 2025-04-29 Frequency: Monthly  Progress: 65 Modality: individual  Related Interventions Conduct Interpersonal Therapy beginning with the assessment of the client's interpersonal inventory of important past and present relationships; develop a case formulation linking depression to grief, interpersonal role disputes, role transitions, and/or interpersonal deficits). Objective Learn and implement problem-solving and decision-making skills. Target Date: 2025-04-29 Frequency: Monthly  Progress: 65 Modality: individual  Related Interventions Conduct Problem-Solving Therapy using techniques such as psychoeducation, modeling, and role-playing to teach client problem-solving skills (i.e., defining a problem specifically, generating possible solutions, evaluating the pros and cons of each solution, selecting and implementing a plan of action, evaluating the efficacy of the plan, accepting or revising the plan); role-play application of the problem-solving skill to a real life issue. Encourage in the client the development of a positive problem orientation in which problems and solving them are viewed as a natural part of life and not something to be feared, despaired, or avoided. 3. Develop healthy interpersonal relationships that lead to the alleviation and help prevent the relapse of depression. 4. Develop healthy thinking patterns and beliefs about self, others, and the world that lead to the alleviation and help prevent the relapse of depression. 5. Recognize, accept, and cope with feelings of depression. Diagnosis F33.1  Conditions For Discharge Achievement of treatment goals and objectives   Veva Alma, LCSW

## 2024-11-18 NOTE — Telephone Encounter (Signed)
 Received FMLA form from [employer].  Patient signed the Release of Information and paid $29 fee.  Form given to [place] working with Dr. Wonda today.

## 2024-11-22 ENCOUNTER — Telehealth: Payer: Self-pay | Admitting: *Deleted

## 2024-11-22 NOTE — Telephone Encounter (Signed)
 Cardiac Catheterization scheduled at [facility] for: Thursday November 24, 2024 9 AM Arrival time [facility] Main Entrance A at: 7 AM  Diet: -Nothing to eat after midnight.  Hydration: -May drink clear liquids until 2 hours before the procedure.  Approved liquids: Water , clear tea, black coffee, fruit juices-non-citric and without pulp,Gatorade, plain Jello/popsicles.   -Please drink 16  oz of water  2 hours before procedure.  Medication instructions: -Hold:  Eliquis -none 11/22/24 until post procedure  Lasix -AM of procedure  -Other usual morning medications can be taken including aspirin  81 mg.  Patient tells me he is not taking KCl supplement.  Plan to go home the same day, you will only stay overnight if medically necessary.  You must have responsible adult to drive you home.  Someone must be with you the first 24 hours after you arrive home.  Reviewed procedure instructions with patient.

## 2024-11-23 NOTE — Progress Notes (Deleted)
°  Cardiology Office Note   Date:  11/23/2024  ID:  NIKLAUS MAMARIL, DOB Aug 30, 1971, MRN [id number] PCP: Domenica Harlene LABOR, MD  [facility] Providers Cardiologist:  Ozell Fell, MD    History of Present Illness Keith Morrow is a 53 y.o. male with a past medical history of multiple sclerosis, CAD, HTN, HLD. Patient presents today for a follow up after cardiac catheterization   Patient previously had NSTEMI in 2021. Treated with stent to the LAD, angioplasty to diagonal, and staged PCI to the posterior AV segment of the RCA.   Diagnosed with atrial fibrillation during an admission in late 09/2024. Started on eliquis  and metoprolol . Echocardiogram on 10/07/24 showed EF 55-60%, no regional wall motion abnormalities, normal RV systolic function, no significant valvular abnormalities. Cardiac PET on 10/20/24 showed findings consistent with ischemia in the RCA territory.   Underwent cardiac catheterization on 11/23/24 ***   CAD   PAF  -  - Continue eliquis  5 mg BID  - Continue metoprolol  tartrate 12.5 mg BID   HTN   HLD  - Lipid panel from 10/2024 showed LDL 66 - Increase lipitor to 80 mg daily  - Repeat lipids, PFTs in 6-8 weeks   Lower extremity edema  - Echocardiogram in 09/2024 showed EF 55-60% - Continue lasix  20 mg daily  - K 4.4, creatinine 0.95 on 11/26   ROS: ***  Studies Reviewed      *** Risk Assessment/Calculations {Does this patient have ATRIAL FIBRILLATION?[phone]} No BP recorded.  {Refresh Note OR Click here to enter BP  :1}***       Physical Exam VS:  There were no vitals taken for this visit.       Wt Readings from Last 3 Encounters:  11/02/24 (!) 340 lb (154.2 kg)  10/10/24 (!) 334 lb 12.8 oz (151.9 kg)  10/06/24 (!) 345 lb (156.5 kg)    GEN: Well nourished, well developed in no acute distress NECK: No JVD; No carotid bruits CARDIAC: ***RRR, no murmurs, rubs, gallops RESPIRATORY:  Clear to auscultation without rales, wheezing or  rhonchi  ABDOMEN: Soft, non-tender, non-distended EXTREMITIES:  No edema; No deformity   ASSESSMENT AND PLAN ***    {Are you ordering a CV Procedure (e.g. stress test, cath, DCCV, TEE, etc)?   Press F2        :[id number]}  Dispo: ***  Signed, Rollo FABIENE Louder, PA-C

## 2024-11-24 ENCOUNTER — Other Ambulatory Visit: Payer: Self-pay

## 2024-11-24 ENCOUNTER — Encounter (HOSPITAL_COMMUNITY): Admission: RE | Disposition: A | Payer: Self-pay | Source: Home / Self Care | Attending: Cardiovascular Disease

## 2024-11-24 ENCOUNTER — Ambulatory Visit (HOSPITAL_COMMUNITY)
Admission: RE | Admit: 2024-11-24 | Discharge: 2024-11-25 | Disposition: A | Attending: Cardiovascular Disease | Admitting: Cardiovascular Disease

## 2024-11-24 ENCOUNTER — Other Ambulatory Visit (HOSPITAL_COMMUNITY): Payer: Self-pay

## 2024-11-24 DIAGNOSIS — Z79899 Other long term (current) drug therapy: Secondary | ICD-10-CM | POA: Insufficient documentation

## 2024-11-24 DIAGNOSIS — I2584 Coronary atherosclerosis due to calcified coronary lesion: Secondary | ICD-10-CM | POA: Insufficient documentation

## 2024-11-24 DIAGNOSIS — I252 Old myocardial infarction: Secondary | ICD-10-CM | POA: Insufficient documentation

## 2024-11-24 DIAGNOSIS — R9439 Abnormal result of other cardiovascular function study: Secondary | ICD-10-CM | POA: Diagnosis present

## 2024-11-24 DIAGNOSIS — Z955 Presence of coronary angioplasty implant and graft: Secondary | ICD-10-CM | POA: Insufficient documentation

## 2024-11-24 DIAGNOSIS — Z7901 Long term (current) use of anticoagulants: Secondary | ICD-10-CM | POA: Insufficient documentation

## 2024-11-24 DIAGNOSIS — I890 Lymphedema, not elsewhere classified: Secondary | ICD-10-CM | POA: Diagnosis not present

## 2024-11-24 DIAGNOSIS — G8220 Paraplegia, unspecified: Secondary | ICD-10-CM | POA: Diagnosis not present

## 2024-11-24 DIAGNOSIS — I2582 Chronic total occlusion of coronary artery: Secondary | ICD-10-CM | POA: Insufficient documentation

## 2024-11-24 DIAGNOSIS — I2510 Atherosclerotic heart disease of native coronary artery without angina pectoris: Secondary | ICD-10-CM | POA: Diagnosis not present

## 2024-11-24 DIAGNOSIS — Z7902 Long term (current) use of antithrombotics/antiplatelets: Secondary | ICD-10-CM | POA: Diagnosis not present

## 2024-11-24 DIAGNOSIS — E782 Mixed hyperlipidemia: Secondary | ICD-10-CM | POA: Diagnosis not present

## 2024-11-24 DIAGNOSIS — Z7982 Long term (current) use of aspirin: Secondary | ICD-10-CM | POA: Insufficient documentation

## 2024-11-24 DIAGNOSIS — I10 Essential (primary) hypertension: Secondary | ICD-10-CM | POA: Diagnosis not present

## 2024-11-24 DIAGNOSIS — Z888 Allergy status to other drugs, medicaments and biological substances status: Secondary | ICD-10-CM | POA: Insufficient documentation

## 2024-11-24 DIAGNOSIS — E039 Hypothyroidism, unspecified: Secondary | ICD-10-CM | POA: Diagnosis not present

## 2024-11-24 DIAGNOSIS — Z993 Dependence on wheelchair: Secondary | ICD-10-CM | POA: Insufficient documentation

## 2024-11-24 DIAGNOSIS — I480 Paroxysmal atrial fibrillation: Secondary | ICD-10-CM | POA: Diagnosis present

## 2024-11-24 DIAGNOSIS — E785 Hyperlipidemia, unspecified: Secondary | ICD-10-CM | POA: Diagnosis present

## 2024-11-24 DIAGNOSIS — G35D Multiple sclerosis, unspecified: Secondary | ICD-10-CM | POA: Diagnosis not present

## 2024-11-24 DIAGNOSIS — R9430 Abnormal result of cardiovascular function study, unspecified: Secondary | ICD-10-CM | POA: Diagnosis present

## 2024-11-24 DIAGNOSIS — Z6841 Body Mass Index (BMI) 40.0 and over, adult: Secondary | ICD-10-CM | POA: Diagnosis not present

## 2024-11-24 HISTORY — PX: CORONARY ANGIOGRAPHY: CATH118303

## 2024-11-24 HISTORY — PX: CORONARY THROMBECTOMY: CATH118304

## 2024-11-24 HISTORY — PX: CORONARY STENT INTERVENTION: CATH118234

## 2024-11-24 LAB — POCT ACTIVATED CLOTTING TIME
Activated Clotting Time: 235 s
Activated Clotting Time: 265 s
Activated Clotting Time: 296 s
Activated Clotting Time: 394 s

## 2024-11-24 LAB — CBC
HCT: 34.3 % — ABNORMAL LOW (ref 39.0–52.0)
Hemoglobin: 11.3 g/dL — ABNORMAL LOW (ref 13.0–17.0)
MCH: 29.6 pg (ref 26.0–34.0)
MCHC: 32.9 g/dL (ref 30.0–36.0)
MCV: 89.8 fL (ref 80.0–100.0)
Platelets: 160 K/uL (ref 150–400)
RBC: 3.82 MIL/uL — ABNORMAL LOW (ref 4.22–5.81)
RDW: 14.5 % (ref 11.5–15.5)
WBC: 7.4 K/uL (ref 4.0–10.5)
nRBC: 0.3 % — ABNORMAL HIGH (ref 0.0–0.2)

## 2024-11-24 SURGERY — CORONARY ANGIOGRAPHY (CATH LAB)
Anesthesia: LOCAL

## 2024-11-24 MED ORDER — CALCIUM CARBONATE ANTACID 500 MG PO CHEW
2.0000 | CHEWABLE_TABLET | Freq: Two times a day (BID) | ORAL | Status: DC | PRN
  Administered 2024-11-24: 18:00:00 400 mg via ORAL
  Filled 2024-11-24: qty 2

## 2024-11-24 MED ORDER — TIROFIBAN HCL IN NACL 5-0.9 MG/100ML-% IV SOLN
INTRAVENOUS | Status: AC
  Filled 2024-11-24: qty 100

## 2024-11-24 MED ORDER — LIDOCAINE HCL (PF) 1 % IJ SOLN
INTRAMUSCULAR | Status: DC | PRN
  Administered 2024-11-24: 12:00:00 10 mL

## 2024-11-24 MED ORDER — FENTANYL CITRATE (PF) 100 MCG/2ML IJ SOLN
INTRAMUSCULAR | Status: AC
  Filled 2024-11-24: qty 2

## 2024-11-24 MED ORDER — IRBESARTAN 150 MG PO TABS
150.0000 mg | ORAL_TABLET | Freq: Every day | ORAL | Status: DC
  Administered 2024-11-25: 150 mg via ORAL
  Filled 2024-11-24: qty 1

## 2024-11-24 MED ORDER — CLONAZEPAM 0.5 MG PO TABS
1.0000 mg | ORAL_TABLET | Freq: Four times a day (QID) | ORAL | Status: DC
  Administered 2024-11-24 – 2024-11-25 (×3): 1 mg via ORAL
  Filled 2024-11-24 (×3): qty 2

## 2024-11-24 MED ORDER — CYCLOBENZAPRINE HCL 10 MG PO TABS
10.0000 mg | ORAL_TABLET | Freq: Three times a day (TID) | ORAL | Status: DC | PRN
  Administered 2024-11-24: 22:00:00 10 mg via ORAL
  Filled 2024-11-24: qty 1

## 2024-11-24 MED ORDER — SODIUM CHLORIDE 0.9% FLUSH: 3.0000 mL | Freq: Two times a day (BID) | INTRAVENOUS | Status: DC

## 2024-11-24 MED ORDER — TIROFIBAN (AGGRASTAT) BOLUS VIA INFUSION
INTRAVENOUS | Status: DC | PRN
  Administered 2024-11-24: 13:00:00 3742.5 ug via INTRAVENOUS

## 2024-11-24 MED ORDER — CLOPIDOGREL BISULFATE 75 MG PO TABS
75.0000 mg | ORAL_TABLET | Freq: Every day | ORAL | Status: DC
  Administered 2024-11-25: 75 mg via ORAL
  Filled 2024-11-24: qty 1

## 2024-11-24 MED ORDER — FREE WATER
500.0000 mL | Freq: Once | Status: AC
  Administered 2024-11-24: 16:00:00 500 mL via ORAL

## 2024-11-24 MED ORDER — VERAPAMIL HCL 2.5 MG/ML IV SOLN
INTRAVENOUS | Status: AC
  Filled 2024-11-24: qty 2

## 2024-11-24 MED ORDER — FREE WATER: 500.0000 mL | Freq: Once | Status: DC

## 2024-11-24 MED ORDER — CLOPIDOGREL BISULFATE 300 MG PO TABS
ORAL_TABLET | ORAL | Status: DC | PRN
  Administered 2024-11-24: 12:00:00 600 mg via ORAL

## 2024-11-24 MED ORDER — NITROGLYCERIN 1 MG/10 ML FOR IR/CATH LAB
INTRA_ARTERIAL | Status: DC | PRN
  Administered 2024-11-24: 13:00:00 200 ug via INTRACORONARY

## 2024-11-24 MED ORDER — METOPROLOL SUCCINATE ER 25 MG PO TB24
25.0000 mg | ORAL_TABLET | Freq: Every day | ORAL | Status: DC
  Administered 2024-11-25: 25 mg via ORAL
  Filled 2024-11-24: qty 1

## 2024-11-24 MED ORDER — HEPARIN SODIUM (PORCINE) 1000 UNIT/ML IJ SOLN
INTRAMUSCULAR | Status: AC
  Filled 2024-11-24: qty 10

## 2024-11-24 MED ORDER — ASPIRIN 81 MG PO CHEW
81.0000 mg | CHEWABLE_TABLET | Freq: Every day | ORAL | Status: DC
  Administered 2024-11-25: 81 mg via ORAL
  Filled 2024-11-24: qty 1

## 2024-11-24 MED ORDER — TIROFIBAN HCL IN NACL 5-0.9 MG/100ML-% IV SOLN
INTRAVENOUS | Status: AC | PRN
  Administered 2024-11-24: 13:00:00 .15 ug/kg/min via INTRAVENOUS
  Administered 2024-11-24: 13:00:00 0.15 ug/kg/min

## 2024-11-24 MED ORDER — MIDAZOLAM HCL (PF) 2 MG/2ML IJ SOLN
INTRAMUSCULAR | Status: DC | PRN
  Administered 2024-11-24 (×3): 1 mg via INTRAVENOUS

## 2024-11-24 MED ORDER — HEPARIN SODIUM (PORCINE) 1000 UNIT/ML IJ SOLN
INTRAMUSCULAR | Status: DC | PRN
  Administered 2024-11-24: 12:00:00 6000 [IU] via INTRAVENOUS
  Administered 2024-11-24: 12:00:00 4000 [IU] via INTRAVENOUS
  Administered 2024-11-24: 12:00:00 12000 [IU] via INTRAVENOUS

## 2024-11-24 MED ORDER — TIROFIBAN HCL IN NACL 5-0.9 MG/100ML-% IV SOLN
0.1500 ug/kg/min | INTRAVENOUS | Status: AC
  Administered 2024-11-24: 17:00:00 0.15 ug/kg/min via INTRAVENOUS
  Filled 2024-11-24 (×5): qty 100

## 2024-11-24 MED ORDER — MIDAZOLAM HCL 2 MG/2ML IJ SOLN
INTRAMUSCULAR | Status: AC
  Filled 2024-11-24: qty 2

## 2024-11-24 MED ORDER — PANTOPRAZOLE SODIUM 40 MG PO TBEC
40.0000 mg | DELAYED_RELEASE_TABLET | Freq: Two times a day (BID) | ORAL | Status: DC
  Administered 2024-11-24 – 2024-11-25 (×2): 40 mg via ORAL
  Filled 2024-11-24 (×2): qty 1

## 2024-11-24 MED ORDER — ASPIRIN 81 MG PO CHEW: 81.0000 mg | CHEWABLE_TABLET | ORAL | Status: DC

## 2024-11-24 MED ORDER — SODIUM CHLORIDE 0.9 % IV SOLN: 250.0000 mL | INTRAVENOUS | Status: DC | PRN

## 2024-11-24 MED ORDER — ATORVASTATIN CALCIUM 40 MG PO TABS
40.0000 mg | ORAL_TABLET | Freq: Every day | ORAL | Status: DC
  Administered 2024-11-24 – 2024-11-25 (×2): 40 mg via ORAL
  Filled 2024-11-24 (×2): qty 1

## 2024-11-24 MED ORDER — METHYLPHENIDATE HCL ER (OSM) 27 MG PO TBCR: 54.0000 mg | EXTENDED_RELEASE_TABLET | ORAL | Status: DC

## 2024-11-24 MED ORDER — APIXABAN 5 MG PO TABS
5.0000 mg | ORAL_TABLET | Freq: Two times a day (BID) | ORAL | Status: DC
  Administered 2024-11-25: 5 mg via ORAL
  Filled 2024-11-24: qty 1

## 2024-11-24 MED ORDER — IOHEXOL 350 MG/ML SOLN
INTRAVENOUS | Status: DC | PRN
  Administered 2024-11-24: 13:00:00 145 mL via INTRA_ARTERIAL

## 2024-11-24 MED ORDER — LABETALOL HCL 5 MG/ML IV SOLN: 10.0000 mg | INTRAVENOUS | Status: AC | PRN

## 2024-11-24 MED ORDER — NITROGLYCERIN 1 MG/10 ML FOR IR/CATH LAB
INTRA_ARTERIAL | Status: AC
  Filled 2024-11-24: qty 10

## 2024-11-24 MED ORDER — ACETAMINOPHEN 325 MG PO TABS
650.0000 mg | ORAL_TABLET | ORAL | Status: DC | PRN
  Administered 2024-11-24 (×2): 650 mg via ORAL
  Filled 2024-11-24 (×2): qty 2

## 2024-11-24 MED ORDER — ONDANSETRON HCL 4 MG/2ML IJ SOLN: 4.0000 mg | Freq: Four times a day (QID) | INTRAMUSCULAR | Status: DC | PRN

## 2024-11-24 MED ORDER — HEPARIN SODIUM (PORCINE) 1000 UNIT/ML IJ SOLN
INTRAMUSCULAR | Status: AC
  Filled 2024-11-24: qty 20

## 2024-11-24 MED ORDER — SODIUM CHLORIDE 0.9% FLUSH: 3.0000 mL | INTRAVENOUS | Status: DC | PRN

## 2024-11-24 MED ORDER — HYDRALAZINE HCL 20 MG/ML IJ SOLN: 10.0000 mg | INTRAMUSCULAR | Status: AC | PRN

## 2024-11-24 MED ORDER — GABAPENTIN 600 MG PO TABS: 600.0000 mg | ORAL_TABLET | Freq: Four times a day (QID) | ORAL | Status: DC

## 2024-11-24 MED ORDER — LIDOCAINE HCL (PF) 1 % IJ SOLN
INTRAMUSCULAR | Status: AC
  Filled 2024-11-24: qty 30

## 2024-11-24 MED ORDER — SODIUM CHLORIDE 0.9 % IV SOLN: 250.0000 mL | INTRAVENOUS | Status: AC | PRN

## 2024-11-24 MED ORDER — GABAPENTIN 300 MG PO CAPS
600.0000 mg | ORAL_CAPSULE | Freq: Four times a day (QID) | ORAL | Status: DC
  Administered 2024-11-24 – 2024-11-25 (×3): 600 mg via ORAL
  Filled 2024-11-24 (×3): qty 2

## 2024-11-24 MED ORDER — BUSPIRONE HCL 5 MG PO TABS
10.0000 mg | ORAL_TABLET | Freq: Three times a day (TID) | ORAL | Status: DC
  Administered 2024-11-24 – 2024-11-25 (×3): 10 mg via ORAL
  Filled 2024-11-24 (×3): qty 2

## 2024-11-24 MED ORDER — TAMSULOSIN HCL 0.4 MG PO CAPS
0.4000 mg | ORAL_CAPSULE | Freq: Every day | ORAL | Status: DC
  Administered 2024-11-25: 0.4 mg via ORAL
  Filled 2024-11-24: qty 1

## 2024-11-24 MED ORDER — CLOPIDOGREL BISULFATE 300 MG PO TABS
ORAL_TABLET | ORAL | Status: AC
  Filled 2024-11-24: qty 2

## 2024-11-24 MED ORDER — VENLAFAXINE HCL ER 150 MG PO CP24
150.0000 mg | ORAL_CAPSULE | Freq: Every day | ORAL | Status: DC
  Filled 2024-11-24: qty 1

## 2024-11-24 MED ORDER — LEVOTHYROXINE SODIUM 50 MCG PO TABS
137.0000 ug | ORAL_TABLET | Freq: Every day | ORAL | Status: DC
  Administered 2024-11-25: 137 ug via ORAL
  Filled 2024-11-24: qty 1

## 2024-11-24 MED ORDER — SODIUM CHLORIDE 0.9% FLUSH
3.0000 mL | Freq: Two times a day (BID) | INTRAVENOUS | Status: DC
  Administered 2024-11-24 – 2024-11-25 (×3): 3 mL via INTRAVENOUS

## 2024-11-24 MED ADMIN — Heparin Sod (Porcine)-NaCl IV Soln 1000 Unit/500ML-0.9%: 1500 mL | SURGICAL_CAVITY | @ 12:00:00 | NDC 00409762013

## 2024-11-24 MED ADMIN — Fentanyl Citrate Preservative Free (PF) Inj 100 MCG/2ML: 50 ug | INTRAVENOUS | @ 13:00:00 | NDC 72572017025

## 2024-11-24 MED ADMIN — Fentanyl Citrate Preservative Free (PF) Inj 100 MCG/2ML: 25 ug | INTRAVENOUS | @ 12:00:00 | NDC 72572017025

## 2024-11-24 MED ADMIN — Fentanyl Citrate Preservative Free (PF) Inj 100 MCG/2ML: 50 ug | INTRAVENOUS | @ 12:00:00 | NDC 72572017025

## 2024-11-24 SURGICAL SUPPLY — 22 items
BALLOON EMERGE MR 2.5X12 (BALLOONS) IMPLANT
BALLOON EMERGE MR 3.0X12 (BALLOONS) IMPLANT
BALLOON SAPPHIRE NC24 3.0X12 (BALLOONS) IMPLANT
BALLOON SAPPHIRE NC24 4.5X12 (BALLOONS) IMPLANT
CANISTER PENUMBRA ENGINE (MISCELLANEOUS) IMPLANT
CATH INDIGO CAT RX KIT (CATHETERS) IMPLANT
CATH INFINITI 5FR MULTPACK ANG (CATHETERS) IMPLANT
CATH VISTA GUIDE 6FR JR4 ECOPK (CATHETERS) IMPLANT
CLOSURE PERCLOSE PROSTYLE (Vascular Products) IMPLANT
GLIDESHEATH SLEND SS 6F .021 (SHEATH) IMPLANT
GUIDEWIRE INQWIRE 1.5J.035X260 (WIRE) IMPLANT
KIT ENCORE 26 ADVANTAGE (KITS) IMPLANT
KIT MICROPUNCTURE NIT STIFF (SHEATH) IMPLANT
PACK CARDIAC CATHETERIZATION (CUSTOM PROCEDURE TRAY) ×1 IMPLANT
SET ATX-X65L (MISCELLANEOUS) IMPLANT
SHEATH PINNACLE 5F 10CM (SHEATH) IMPLANT
SHEATH PINNACLE 6F 10CM (SHEATH) IMPLANT
SHEATH PROBE COVER 6X72 (BAG) IMPLANT
STENT SYNERGY XD 4.0X16 (Permanent Stent) IMPLANT
WIRE EMERALD 3MM-J .035X150CM (WIRE) IMPLANT
WIRE EMERALD 3MM-J .035X260CM (WIRE) IMPLANT
WIRE RUNTHROUGH IZANAI 014 180 (WIRE) IMPLANT

## 2024-11-24 NOTE — Progress Notes (Signed)
 Pt and his wife stated that pt hit his right upper arm and left scapula from the handle while transferring him to the bed in the short stay area. The site is hard on palpation, tender and ecchymosed. pain 6/10 on touch. Heat pack and tylenol  given for pain. Dr. Wonda made aware.

## 2024-11-24 NOTE — Telephone Encounter (Signed)
° °  This came to my fax, letter uploaded into PT chart under the media tab.

## 2024-11-24 NOTE — Telephone Encounter (Signed)
 Patient informed via MyChart.

## 2024-11-24 NOTE — Interval H&P Note (Signed)
 History and Physical Interval Note:  11/24/2024 11:11 AM  Keith Morrow  has presented today for surgery, with the diagnosis of CAD.  The various methods of treatment have been discussed with the patient and family. After consideration of risks, benefits and other options for treatment, the patient has consented to  Procedures: LEFT HEART CATH AND CORONARY ANGIOGRAPHY (N/A) as a surgical intervention.  The patient's history has been reviewed, patient examined, no change in status, stable for surgery.  I have reviewed the patient's chart and labs.  Questions were answered to the patient's satisfaction.     Ozell Fell

## 2024-11-25 ENCOUNTER — Other Ambulatory Visit (HOSPITAL_COMMUNITY): Payer: Self-pay

## 2024-11-25 ENCOUNTER — Encounter (HOSPITAL_COMMUNITY): Payer: Self-pay | Admitting: Cardiovascular Disease

## 2024-11-25 DIAGNOSIS — E66813 Obesity, class 3: Secondary | ICD-10-CM

## 2024-11-25 DIAGNOSIS — R9439 Abnormal result of other cardiovascular function study: Secondary | ICD-10-CM

## 2024-11-25 DIAGNOSIS — E7800 Pure hypercholesterolemia, unspecified: Secondary | ICD-10-CM

## 2024-11-25 DIAGNOSIS — G8220 Paraplegia, unspecified: Secondary | ICD-10-CM | POA: Diagnosis not present

## 2024-11-25 DIAGNOSIS — Z7902 Long term (current) use of antithrombotics/antiplatelets: Secondary | ICD-10-CM | POA: Diagnosis not present

## 2024-11-25 DIAGNOSIS — I252 Old myocardial infarction: Secondary | ICD-10-CM | POA: Diagnosis not present

## 2024-11-25 DIAGNOSIS — Z7982 Long term (current) use of aspirin: Secondary | ICD-10-CM | POA: Diagnosis not present

## 2024-11-25 DIAGNOSIS — I2582 Chronic total occlusion of coronary artery: Secondary | ICD-10-CM | POA: Diagnosis not present

## 2024-11-25 DIAGNOSIS — I10 Essential (primary) hypertension: Secondary | ICD-10-CM | POA: Diagnosis not present

## 2024-11-25 DIAGNOSIS — I2510 Atherosclerotic heart disease of native coronary artery without angina pectoris: Secondary | ICD-10-CM | POA: Diagnosis not present

## 2024-11-25 DIAGNOSIS — I2584 Coronary atherosclerosis due to calcified coronary lesion: Secondary | ICD-10-CM | POA: Diagnosis not present

## 2024-11-25 DIAGNOSIS — Z6841 Body Mass Index (BMI) 40.0 and over, adult: Secondary | ICD-10-CM

## 2024-11-25 DIAGNOSIS — Z955 Presence of coronary angioplasty implant and graft: Secondary | ICD-10-CM | POA: Diagnosis not present

## 2024-11-25 DIAGNOSIS — G35D Multiple sclerosis, unspecified: Secondary | ICD-10-CM | POA: Diagnosis not present

## 2024-11-25 LAB — BASIC METABOLIC PANEL WITH GFR
Anion gap: 10 (ref 5–15)
BUN: 16 mg/dL (ref 6–20)
CO2: 25 mmol/L (ref 22–32)
Calcium: 9 mg/dL (ref 8.9–10.3)
Chloride: 105 mmol/L (ref 98–111)
Creatinine, Ser: 0.79 mg/dL (ref 0.61–1.24)
GFR, Estimated: >60 mL/min
Glucose, Bld: 85 mg/dL (ref 70–99)
Potassium: 4 mmol/L (ref 3.5–5.1)
Sodium: 140 mmol/L (ref 135–145)

## 2024-11-25 LAB — CBC
HCT: 35.2 % — ABNORMAL LOW (ref 39.0–52.0)
Hemoglobin: 11.6 g/dL — ABNORMAL LOW (ref 13.0–17.0)
MCH: 29.6 pg (ref 26.0–34.0)
MCHC: 33 g/dL (ref 30.0–36.0)
MCV: 89.8 fL (ref 80.0–100.0)
Platelets: 149 K/uL — ABNORMAL LOW (ref 150–400)
RBC: 3.92 MIL/uL — ABNORMAL LOW (ref 4.22–5.81)
RDW: 14.7 % (ref 11.5–15.5)
WBC: 7.6 K/uL (ref 4.0–10.5)
nRBC: 0 % (ref 0.0–0.2)

## 2024-11-25 MED ORDER — ASPIRIN 81 MG PO CHEW: 81.0000 mg | CHEWABLE_TABLET | Freq: Every day | ORAL | Status: AC

## 2024-11-25 MED ORDER — FUROSEMIDE 20 MG PO TABS: 20.0000 mg | ORAL_TABLET | Freq: Every day | ORAL | Status: DC | PRN

## 2024-11-25 MED ORDER — EZETIMIBE 10 MG PO TABS: 10.0000 mg | ORAL_TABLET | Freq: Every day | ORAL | 3 refills | Status: AC

## 2024-11-25 MED ORDER — FUROSEMIDE 20 MG PO TABS: 20.0000 mg | ORAL_TABLET | Freq: Every day | ORAL | Status: AC | PRN

## 2024-11-25 MED ORDER — CLOPIDOGREL BISULFATE 75 MG PO TABS
75.0000 mg | ORAL_TABLET | Freq: Every day | ORAL | 3 refills | Status: AC
  Filled 2024-11-25: qty 30, 30d supply, fill #0

## 2024-11-25 MED ORDER — OLMESARTAN MEDOXOMIL 5 MG PO TABS: 10.0000 mg | ORAL_TABLET | Freq: Every day | ORAL | 1 refills | Status: AC

## 2024-11-25 NOTE — Plan of Care (Signed)
  Problem: Activity: Goal: Ability to return to baseline activity level will improve Outcome: Progressing   Problem: Cardiovascular: Goal: Ability to achieve and maintain adequate cardiovascular perfusion will improve Outcome: Progressing Goal: Vascular access site(s) Level 0-1 will be maintained Outcome: Progressing   

## 2024-11-25 NOTE — Progress Notes (Signed)
 Discussed with pt and wife stent, restrictions, Plavix  importance,  diet, NTG, and CRPII. Pt receptive. He is unable to ambulate due to his MS. He is w/c bound. Will refer to [facility] CRPII. We discussed recumbent machines and he and his wife will think about doing the program. Would need assist transferring to machines from his w/c. [phone] Aliene Aris BS, ACSM-CEP 11/25/2024 10:22 AM

## 2024-11-25 NOTE — Discharge Instructions (Addendum)
 Medication Changes: - START Aspirin  81mg  once daily and Plavix  75mg  once daily in addition to the Eliquis  5mg  twice daily that you are already taking. You can stop Aspirin  after 1 month. Please take the Plavix  until we instruct you to stop this. - START Zetia 10mg  daily. - DECREASE Olmesartan  to 10mg  once daily. - Recommend avoiding NSAIDS (Ibuprofen, Motrin, Aleve, etc.) and using Acetaminophen  (Tylenol ) as needed for pain instead. _______________  Post Cardiac Catheterization: NO HEAVY LIFTING OR SEXUAL ACTIVITY X 7 DAYS. NO DRIVING X 3-5 DAYS. NO SOAKING BATHS, HOT TUBS, POOLS, ETC., X 7 DAYS.  Groin Site Care: Refer to this sheet in the next few weeks. These instructions provide you with information on caring for yourself after your procedure. Your caregiver may also give you more specific instructions. Your treatment has been planned according to current medical practices, but problems sometimes occur. Call your caregiver if you have any problems or questions after your procedure. HOME CARE INSTRUCTIONS You may shower 24 hours after the procedure. Remove the bandage (dressing) and gently wash the site with plain soap and water . Gently pat the site dry.  Do not apply powder or lotion to the site.  Do not sit in a bathtub, swimming pool, or whirlpool for 5 to 7 days.  No bending, squatting, or lifting anything over 10 pounds (4.5 kg) as directed by your caregiver.  Inspect the site at least twice daily.  Do not drive home if you are discharged the same day of the procedure. Have someone else drive you.  What to expect: Any bruising will usually fade within 1 to 2 weeks.  Blood that collects in the tissue (hematoma) may be painful to the touch. It should usually decrease in size and tenderness within 1 to 2 weeks.  SEEK IMMEDIATE MEDICAL CARE IF: You have unusual pain at the groin site or down the affected leg.  You have redness, warmth, swelling, or pain at the groin site.  You have  drainage (other than a small amount of blood on the dressing).  You have chills.  You have a fever or persistent symptoms for more than 72 hours.  You have a fever and your symptoms suddenly get worse.  Your leg becomes pale, cool, tingly, or numb.  You have heavy bleeding from the site. Hold pressure on the site.

## 2024-11-25 NOTE — Discharge Summary (Signed)
 " Discharge Summary   Patient ID: Keith Morrow MRN: [id number]; DOB: 1971-11-29  Admit date: 11/24/2024 Discharge date: 11/25/2024  PCP:  Domenica Harlene LABOR, MD   [facility] Providers Cardiologist:  Ozell Fell, MD       Discharge Diagnoses  Principal Problem:   Abnormal stress test Active Problems:   CAD (coronary artery disease)   Multiple sclerosis   Hyperlipidemia   Hypothyroidism   HTN (hypertension)   Paroxysmal atrial fibrillation [facility])   Diagnostic Studies/Procedures   Coronary Stent Intervention 11/24/2024: 1.  Patent left main with mild diffuse plaquing 2.  Patent proximal LAD stent with moderately severe diffuse calcific mid LAD stenosis and diffusely diseased distal LAD 3.  Moderate mid circumflex stenosis and severe stenosis of the distal circumflex/OM1/OM 2 trifurcation 4.  Severe subtotal occlusion of the proximal RCA with calcification and thrombus, treated successfully with complex PCI requiring multiple balloon inflations, mechanical thrombectomy, and ultimately stenting of the proximal lesion with a 4.0 x 16 mm Synergy DES   Recommendations: Continue Aggrastat  x 18 hours, resume apixaban  tomorrow, aspirin  x 30 days, clopidogrel  times at least 6 months.  Favor medical therapy for residual CAD.  Diagnostic Dominance: Right     _____________   History of Present Illness   Keith Morrow is a 53 y.o. male with a history of CAD with NSTEMI in 2012 s/p DES to LAD and angioplasty to Diag and staged PCI to RCA, paroxysmal atrial fibrillation on Eliquis , lymphedema, hypertension, hyperlipidemia, hypothyroidism, multiple sclerosis, and anxiety/ depression. Patient was admitted in 09/2024 for new onset chest pain with RVR and chest pain. High-sensitivity troponin was minimally elevated and peaked at 57.  Echo showed LVEF of 55-60% with normal wall motion, normal RV function, and no significant valvular disease.  He spontaneously converted to sinus rhythm  and chest pain essentially resolved with this.  He was started on metoprolol  and Eliquis  and outpatient cardiac PET stress test was ordered.  It was performed on 10/20/2024 and was intermediate risk and showed findings consistent with ischemia in the RCA territory.  Catheterization was recommended.  He was seen by Orren Fabry, PA-C, at which time he reported occasional brief shortness of breath and outpatient cardiac catheterization was arranged.  Hospital Course   Consultants: None  Patient [facility] to [facility] on [facility]24/2024 for planned cardiac catheterization. LHC showed patent proximal LAD stent with moderately severe diffuse calcific mid LAD stenosis and diffusely diseased distal LAD, severe subtotal occlusion of the proximal RCA with calcification and thrombus, moderate mid circumflex stenosis, and severe stenosis of the distal LCx/OM1/OM 2 trifurcation.  He underwent successful PCI requiring multiple balloon inflations, mechanical thrombectomy, and ultimately stenting of the proximal RCA lesion.  He was treated with Aggrastat  for 18 hours.  Plan is for triple therapy with Aspirin  81 mg daily, Plavix  75 mg daily, and Eliquis  5 mg twice daily for 1 month.  Aspirin  can be stopped after 1 month.  Plavix  should be continued for at least 6 months.  Will start Zetia 10mg  daily in addition to home Lipitor 40mg  daily. Will need repeat lipid panel and LFTs in 6- 8 weeks. Will decrease Olmesartan  to 10mg  daily given soft BP. Continue home Toprol -XL 25mg  daily. Continue all other home medications.   Patient was seen and examined by Dr. Ladona today and determined to be stable for discharge. Instructions/precautions regarding cath site care were given prior to discharge. Outpatient follow-up arranged. Medications as below.  Did the patient have  an acute coronary syndrome (MI, NSTEMI, STEMI, etc) this admission?:  No                               Did the patient have a percutaneous coronary intervention  (stent / angioplasty)?:  Yes.     Cath/PCI Registry Performance & Quality Measures: Aspirin  prescribed? - Yes ADP Receptor Inhibitor (Plavix /Clopidogrel , Brilinta/Ticagrelor or Effient /Prasugrel ) prescribed (includes medically managed patients)? - Yes High Intensity Statin (Lipitor 40-80mg  or Crestor 20-40mg ) prescribed? - Yes For EF <40%, was ACEI/ARB prescribed? - Not Applicable (EF >/= 40%) For EF <40%, Aldosterone Antagonist (Spironolactone or Eplerenone) prescribed? - Not Applicable (EF >/= 40%) Cardiac Rehab Phase II ordered? - Yes   _____________  Discharge Vitals Blood pressure 111/60, pulse 69, temperature 99 F (37.2 C), temperature source Oral, resp. rate 18, height 6' 3 (1.905 m), weight (!) 149.7 kg, SpO2 96%.  Filed Weights   11/24/24 0800 11/24/24 0900  Weight: (!) 149.7 kg (!) 149.7 kg    Labs & Radiologic Studies  CBC Recent Labs    11/24/24 2019 11/25/24 0538  WBC 7.4 7.6  HGB 11.3* 11.6*  HCT 34.3* 35.2*  MCV 89.8 89.8  PLT 160 149*   Basic Metabolic Panel Recent Labs    87/80/74 0538  NA 140  K 4.0  CL 105  CO2 25  GLUCOSE 85  BUN 16  CREATININE 0.79  CALCIUM  9.0   Liver Function Tests No results for input(s): AST, ALT, ALKPHOS, BILITOT, PROT, ALBUMIN in the last 72 hours. No results for input(s): LIPASE, AMYLASE in the last 72 hours. High Sensitivity Troponin:   No results for input(s): TROPONINIHS in the last 720 hours.  No results for input(s): TRNPT in the last 720 hours.  BNP Invalid input(s): POCBNP No results for input(s): PROBNP in the last 72 hours.  No results for input(s): BNP in the last 72 hours.  D-Dimer No results for input(s): DDIMER in the last 72 hours. Hemoglobin A1C No results for input(s): HGBA1C in the last 72 hours. Fasting Lipid Panel No results for input(s): CHOL, HDL, LDLCALC, TRIG, CHOLHDL, LDLDIRECT in the last 72 hours. No results found for: LIPOA  Thyroid   Function Tests No results for input(s): TSH, T4TOTAL, T3FREE, THYROIDAB in the last 72 hours.  Invalid input(s): FREET3 _____________  CARDIAC CATHETERIZATION Result Date: 11/24/2024 1.  Patent left main with mild diffuse plaquing 2.  Patent proximal LAD stent with moderately severe diffuse calcific mid LAD stenosis and diffusely diseased distal LAD 3.  Moderate mid circumflex stenosis and severe stenosis of the distal circumflex/OM1/OM 2 trifurcation 4.  Severe subtotal occlusion of the proximal RCA with calcification and thrombus, treated successfully with complex PCI requiring multiple balloon inflations, mechanical thrombectomy, and ultimately stenting of the proximal lesion with a 4.0 x 16 mm Synergy DES Recommendations: Continue Aggrastat  x 18 hours, resume apixaban  tomorrow, aspirin  x 30 days, clopidogrel  times at least 6 months.  Favor medical therapy for residual CAD.    Disposition Patient is being discharged home today in good condition.  Follow-up Plans & Appointments  Follow-up Information     Vicci Rollo SAUNDERS, PA-C Follow up.   Specialty: Cardiology Why: Follow-up with Cardiology scheduled for 12/06/2024 at 2:45pm. Please arrive 20 minutes early for check-in. If this date/ time does not work for you, please call our office to reschedule. Contact information: [address] [place] [address] [phone]  Discharge Instructions     Amb Referral to Cardiac Rehabilitation   Complete by: As directed    Diagnosis:  Coronary Stents PTCA     After initial evaluation and assessments completed: Virtual Based Care may be provided alone or in conjunction with Phase 2 Cardiac Rehab based on patient barriers.: Yes   Intensive Cardiac Rehabilitation (ICR) [facility] location only OR Traditional Cardiac Rehabilitation (TCR) *If criteria for ICR are not met will enroll in TCR [facility] only): Yes       Discharge Medications Allergies as of  11/25/2024       Reactions   Benadryl [diphenhydramine] Other (See Comments)   Mood reaction, anger and inability to control emotion.        Medication List     STOP taking these medications    ibuprofen 200 MG tablet Commonly known as: ADVIL       TAKE these medications    apixaban  5 MG Tabs tablet Commonly known as: ELIQUIS  Take 1 tablet (5 mg total) by mouth 2 (two) times daily.   aspirin  81 MG chewable tablet Chew 1 tablet (81 mg total) by mouth daily. Take for 30 days then stop What changed:  when to take this additional instructions   atorvastatin  40 MG tablet Commonly known as: LIPITOR TAKE 1 TABLET DAILY   BACLOFEN  IT 1 Dose by Intrathecal route as directed. Lioresal  1860mcg/459.9mcg daily, Clonidine 495mcg/109.65mcg daily/hydromorphone  437mcg/104.4mcg daily/baclofen  459.72mcg/29.9mcg daily   busPIRone  10 MG tablet Commonly known as: BUSPAR  TAKE 1 TABLET IN THE MORNING, AT NOON, IN THE EVENING AND AT BEDTIME What changed: See the new instructions.   cetirizine 10 MG tablet Commonly known as: ZYRTEC Take 10 mg by mouth daily as needed for allergies.   clonazePAM  1 MG tablet Commonly known as: KLONOPIN  Take 1 tablet (1 mg total) by mouth 4 (four) times daily.   clopidogrel  75 MG tablet Commonly known as: PLAVIX  Take 1 tablet (75 mg total) by mouth daily with breakfast. Start taking on: November 26, 2024   cyclobenzaprine  10 MG tablet Commonly known as: FLEXERIL  TAKE 1 TABLET BY MOUTH THREE TIMES A DAY AS NEEDED FOR MUSCLE SPASMS   desvenlafaxine  100 MG 24 hr tablet Commonly known as: Pristiq  Take 1 tablet (100 mg total) by mouth daily.   ezetimibe 10 MG tablet Commonly known as: Zetia Take 1 tablet (10 mg total) by mouth daily.   furosemide  20 MG tablet Commonly known as: LASIX  Take 1 tablet (20 mg total) by mouth daily as needed (weight gain (3lbs in 1 day/ 5lbs in 1 week) or worsening edema/ shortness of breath). What changed: See the new  instructions.   gabapentin  600 MG tablet Commonly known as: NEURONTIN  Take 1 tablet (600 mg total) by mouth 4 (four) times daily.   ketoconazole  2 % shampoo Commonly known as: NIZORAL  APPLY TOPICALLY 2 TIMES A WEEK   methylphenidate  54 MG CR tablet Commonly known as: Concerta  Take 1 tablet (54 mg total) by mouth every morning.   metoprolol  succinate 25 MG 24 hr tablet Commonly known as: Toprol  XL Take 1 tablet (25 mg total) by mouth daily.   natalizumab 300 MG/15ML injection Commonly known as: TYSABRI Inject 300 mg into the vein every 28 (twenty-eight) days.   nitroGLYCERIN  0.4 MG SL tablet Commonly known as: NITROSTAT  PLACE 1 TABLET UNDER THE TOUNGE EVERY 5 MINUTES AS NEEDED FOR CHEST PAIN (X 3 DOSES)   olmesartan  5 MG tablet Commonly known as: BENICAR  Take 2 tablets (10 mg total)  by mouth daily. What changed:  medication strength how much to take   pantoprazole  40 MG tablet Commonly known as: PROTONIX  TAKE 1 TABLET BY MOUTH TWICE A DAY   potassium chloride  SA 20 MEQ tablet Commonly known as: Klor-Con  M20 TAKE 1 TABLET BY MOUTH EVERY DAY AS NEEDED WHEN TAKING A SECOND LASIX  TAB ANY GIVEN DAY   sucralfate  1 g tablet Commonly known as: CARAFATE  Take 1 tablet (1 g total) by mouth 4 (four) times daily as needed.   Synthroid  137 MCG tablet Generic drug: levothyroxine  Take 137 mcg by mouth daily before breakfast.   tamsulosin  0.4 MG Caps capsule Commonly known as: FLOMAX  TAKE 1 CAPSULE DAILY   Vitamin D  (Ergocalciferol ) 1.25 MG (50000 UNIT) Caps capsule Commonly known as: DRISDOL  Take 1 capsule (50,000 Units total) by mouth every 7 (seven) days.   Vitamin D  50 MCG (2000 UT) Caps Take 2,000 Units by mouth daily.         Outstanding Labs/Studies   Duration of Discharge Encounter: APP Time: 25 minutes   Signed, Diontre Harps E Sherley Leser, PA-C 11/25/2024, 2:12 PM     "

## 2024-11-26 LAB — LIPOPROTEIN A (LPA): Lipoprotein (a): 173.7 nmol/L — ABNORMAL HIGH

## 2024-11-29 ENCOUNTER — Other Ambulatory Visit: Payer: Self-pay | Admitting: Neurology

## 2024-11-29 DIAGNOSIS — I480 Paroxysmal atrial fibrillation: Secondary | ICD-10-CM

## 2024-11-29 DIAGNOSIS — I10 Essential (primary) hypertension: Secondary | ICD-10-CM

## 2024-11-29 NOTE — Telephone Encounter (Signed)
"   Call patient   Patient wanted to confirm if he was continue taking Metoprolol .  He had a cath 11/24/24. RN asked patient if he had his recent  bottle from Nov.2025. patient states he can not find it.   He states  he did not  receive instructions from discharge.  RN reviewed patient chart - discharge note     Plan is for triple therapy with Aspirin  81 mg daily, Plavix  75 mg daily, and Eliquis  5 mg twice daily for 1 month.  Aspirin  can be stopped after 1 month.  Plavix  should be continued for at least 6 months.  Will start Zetia  10mg  daily in addition to home Lipitor 40mg  daily. Will need repeat lipid panel and LFTs in 6- 8 weeks. Will decrease Olmesartan  to 10mg  daily given soft BP. Continue home Toprol -XL 25mg  daily. Continue all other home medications.     Patient aware , and request medication to be called in to the pharmacy. RN informed patient will call  the pharmacy . In the future, patient can call himself.    RN called pharmacy - per representative - patient received a 90 day supply  last month. Pharmacist sates he will see if insurance or coupon will help patient to get medicaiton "

## 2024-11-29 NOTE — Telephone Encounter (Signed)
 Last seen on 11/02/24 Follow up scheduled 06/08/25  It appears medication has not be dispensed since 11/2026

## 2024-12-02 ENCOUNTER — Telehealth: Payer: Self-pay | Admitting: Cardiovascular Disease

## 2024-12-02 ENCOUNTER — Ambulatory Visit: Admitting: Student

## 2024-12-02 NOTE — Telephone Encounter (Signed)
 I tried to call and go through this case and couldn't get through. I don't even understand what this is about. He has already had a stress test, cardiac catheterization, and PCI performed.

## 2024-12-02 NOTE — Telephone Encounter (Signed)
 [employer] Calling with recommendation of approval or denial for stress test. Please advise.   [phone] option 4 reference [id number]

## 2024-12-05 ENCOUNTER — Encounter: Payer: Self-pay | Admitting: Neurology

## 2024-12-05 ENCOUNTER — Other Ambulatory Visit: Payer: Self-pay | Admitting: Family Medicine

## 2024-12-06 ENCOUNTER — Other Ambulatory Visit: Payer: Self-pay

## 2024-12-06 ENCOUNTER — Ambulatory Visit: Attending: Cardiology

## 2024-12-06 VITALS — BP 126/68 | HR 62 | Ht 75.0 in

## 2024-12-06 DIAGNOSIS — I5032 Chronic diastolic (congestive) heart failure: Secondary | ICD-10-CM

## 2024-12-06 DIAGNOSIS — I480 Paroxysmal atrial fibrillation: Secondary | ICD-10-CM | POA: Diagnosis not present

## 2024-12-06 DIAGNOSIS — E7849 Other hyperlipidemia: Secondary | ICD-10-CM

## 2024-12-06 DIAGNOSIS — I2510 Atherosclerotic heart disease of native coronary artery without angina pectoris: Secondary | ICD-10-CM

## 2024-12-06 DIAGNOSIS — I10 Essential (primary) hypertension: Secondary | ICD-10-CM

## 2024-12-06 MED ORDER — METHYLPHENIDATE HCL ER (OSM) 54 MG PO TBCR: 54.0000 mg | EXTENDED_RELEASE_TABLET | ORAL | 0 refills | Status: DC

## 2024-12-06 NOTE — Patient Instructions (Addendum)
 Medication Instructions:  CONTINUE TO TAKE YOUR LASIX  20 MG AS NEEDED. STOP TAKING ASPIRIN  81 MG ON 12-25-24.  Lab Work: NONE TO BE DONE TODAY.  Testing/Procedures: NONE  Follow-Up: At [facility], you and your health needs are our priority.  As part of our continuing mission to provide you with exceptional heart care, our providers are all part of one team.  This team includes your primary Cardiologist (physician) and Advanced Practice Providers or APPs (Physician Assistants and Nurse Practitioners) who all work together to provide you with the care you need, when you need it.  Your next appointment:   KEEP 02-13-25 APPOINTMENT WITH DR. WONDA AT 8:20 AM  Provider:   Ozell Wonda, MD     Other Instructions:  A REFERRAL TO OUR LIPID CLINIC HAS BEEN PUT IN AND SOMEONE FROM OUR OFFICE WILL CONTACT YOU TO GET YOU SCHEDULED.

## 2024-12-06 NOTE — Telephone Encounter (Signed)
 Pt Last Seen 11/02/24 Upcoming Appointment 06/08/25   Concerta  54 mg 11/02/24

## 2024-12-06 NOTE — Progress Notes (Signed)
 " Cardiology Office Note   Date: 12/06/2024  ID:  [name] 01-08-1971 [id number] PCP: Domenica Harlene LABOR, MD  [facility] Providers Cardiologist: Ozell Fell, MD     Chief Complaint: Keith Morrow is a 53 y.o.male with PMH of significant diffuse CAD s/p multiple PCIs, PAF, hypertension, hyperlipidemia, MS who presents to the clinic for post-catheterization follow-up.    Keith Morrow had NSTEMI in 2012 that was treated with DES to LAD and angioplasty to diagonal that was followed by subsequent staged PCI to the RCA. LVEF preserved. Stress test 12/20/2015 intermediate risk with scar in the basal and mid inferior walls and very mild ischemia in the mid and apical inferior walls. Echo 01/11/2020 for dyspnea showed LVEF 60-65%, no RWMAs, trivial MR.   Seen by Dr. Fell 07/27/2024 and was doing okay from a cardiac standpoint. Was diagnosed with atrial fibrillation during an admission 10/06/2024-10/07/2024. He was started on Eliquis  and metoprolol . Echo 10/07/24 showed LVEF 55-60%, no RWMAs, normal RV, trivial MR.   Outpatient cardiac PET stress 10/20/2024 showed ischemia consistent with RCA occlusion. Rest LVEF 59%, stress LVEF 64%. He was scheduled for diagnostic cardiac catheterization. This was done 11/24/24 which showed patent LM, patent prox LAD DES, severe disease in mid and distal LAD, moderate to severe stenosis in mid and distal LCx/OM1/OM2, subtotal occlusion of the prox RCA that was treated with complex PCI and DES. It was recommended that he be discharged on DAPT with Plavix  and aspirin  for 30 days followed by Plavix  monotherapy for at least six months. Eliquis  was resumed the following day and he was discharged.    History of Present Illness: Today he is doing well, denies further palpitations or chest pain that brought him to the hospital at the end of October.  Was taking Lasix  for LE edema daily prior to his hospitalization but since then he has not taken it.  Does still have LE  edema compression stockings improve.  BP at home 120s/70s.  Is seeing Dr. Shellia next month for possible BiPAP.  He is doing physical therapy once per week.  Denies chest discomfort, dyspnea, palpitations, abnormal bleeding.  ROS: Please see the history of present illness. All other systems reviewed and are negative.   Studies Reviewed: The following studies were personally reviewed today: EKG Interpretation Date/Time:  Tuesday December 06 2024 14:40:19 EST Ventricular Rate:  62 PR Interval:  146 QRS Duration:  86 QT Interval:  420 QTC Calculation: 426 R Axis:   25  Text Interpretation: Normal sinus rhythm Confirmed by Nola Numbers ([id number]) on 12/06/2024 2:44:37 PM   Cardiac Studies & Procedures   ______________________________________________________________________________________________ CARDIAC CATHETERIZATION  CARDIAC CATHETERIZATION 11/24/2024  Conclusion 1.  Patent left main with mild diffuse plaquing 2.  Patent proximal LAD stent with moderately severe diffuse calcific mid LAD stenosis and diffusely diseased distal LAD 3.  Moderate mid circumflex stenosis and severe stenosis of the distal circumflex/OM1/OM 2 trifurcation 4.  Severe subtotal occlusion of the proximal RCA with calcification and thrombus, treated successfully with complex PCI requiring multiple balloon inflations, mechanical thrombectomy, and ultimately stenting of the proximal lesion with a 4.0 x 16 mm Synergy DES  Recommendations: Continue Aggrastat  x 18 hours, resume apixaban  tomorrow, aspirin  x 30 days, clopidogrel  times at least 6 months.  Favor medical therapy for residual CAD.  Findings Coronary Findings Diagnostic  Dominance: Right  Left Main There is mild diffuse disease throughout the vessel.  Left Anterior Descending There is mild diffuse disease throughout the  vessel. The LAD is patent proximally.  The proximal stent is widely patent with no stenosis.  The mid LAD has diffuse calcific stenosis of 60  to 70% and the entire mid and distal vessel is diffusely diseased. Previously placed Prox LAD to Mid LAD stent of unknown type is  widely patent. Mid LAD to Dist LAD lesion is 70% stenosed.  Ramus Intermedius The ramus intermedius is patent with mild to moderate nonobstructive stenosis proximally. Ramus lesion is 50% stenosed.  Left Circumflex The circumflex is diffusely diseased.  The proximal vessel has 60% diffuse stenosis.  The mid circumflex trifurcates into 2 obtuse marginal branches and the distal AV circumflex.  The first OM has 60 to 70% stenosis and the second OM has tight 90% stenosis.  The more proximal OM is of moderate caliber and the second OM is of small caliber. Prox Cx to Mid Cx lesion is 60% stenosed. Mid Cx to Dist Cx lesion is 90% stenosed.  Second Obtuse Marginal Branch 2nd Mrg lesion is 70% stenosed.  Right Coronary Artery There is mild diffuse disease throughout the vessel. The vessel is severely calcified. Ost RCA to Prox RCA lesion is 99% stenosed. Mid RCA lesion is 40% stenosed.  Right Posterior Descending Artery RPDA lesion is 100% stenosed.  Right Posterior Atrioventricular Artery Previously placed RPAV stent of unknown type is  widely patent.  Intervention  No interventions have been documented.   STRESS TESTS  NM PET CT CARDIAC PERFUSION MULTI W/ABSOLUTE BLOODFLOW 10/20/2024  Narrative   LV perfusion is abnormal. Defect 1: There is a medium defect with severe reduction in uptake present in the apical to basal inferior and inferoseptal location(s) that is reversible. There is abnormal wall motion in the defect area. Consistent with ischemia.   Rest left ventricular function is normal. Rest EF: 59%. Stress left ventricular function is normal. Stress EF: 64%. End diastolic cavity size is normal. End systolic cavity size is normal.   Myocardial blood flow was computed to be 0.61ml/g/min at rest and 1.11ml/g/min at stress. Global myocardial blood flow  reserve was 1.82 and was mildly abnormal.   Coronary calcium  assessment not performed due to prior revascularization.  Trivial pericardial effusion versus pericardial thickening is incidentally noted.   Findings are consistent with ischemia in the RCA territory. The study is intermediate risk.   Electronically signed by: Lonni Hanson, MD.  EXAM: OVER-READ INTERPRETATION CARDIAC CT CHEST  The following report is an over-read performed by radiologist Dr. Ryan Salvage of [facility] on 10/20/2024. This over-read does not include interpretation of cardiac or coronary anatomy or pathology. The cardiac and PET interpretation by the cardiologist is attached or will be attached.  COMPARISON:  10/06/2024  FINDINGS: Extracardiac Vascular: Mild thoracic aortic atheromatous vascular disease.  Mediastinum: Small type 1 hiatal hernia.  Lung: Dependent subsegmental atelectasis in both lungs. Mild lingular scarring.  Included Upper Abdomen: Stable nonspecific hypodense hepatic lesions. These are technically too small to characterize.  Musculoskeletal: Thoracic spondylosis.  IMPRESSION: 1. Small type 1 hiatal hernia. 2. Thoracic spondylosis. 3. Stable nonspecific hypodense hepatic lesions. 4.  Aortic Atherosclerosis (ICD10-I70.0).   Electronically Signed By: Ryan Salvage M.D. On: 10/20/2024 16:33   ECHOCARDIOGRAM  ECHOCARDIOGRAM COMPLETE 10/07/2024  Narrative ECHOCARDIOGRAM REPORT    Patient Name:   Keith Morrow Date of Exam: 10/07/2024 Medical Rec #:  [id number]      Height:       75.0 in Accession #:    [id number]  Weight:       345.0 lb Date of Birth:  16-Sep-1971      BSA:          2.767 m Patient Age:    53 years       BP:           97/54 mmHg Patient Gender: M              HR:           75 bpm. Exam Location:  Inpatient  Procedure: 2D Echo and Intracardiac Opacification Agent (Both Spectral and Color Flow Doppler were utilized during  procedure).  Indications:    Chest pain  History:        Patient has prior history of Echocardiogram examinations. CAD; Risk Factors:Hypertension.  Sonographer:    Charmaine Gaskins Referring Phys: [phone] EKTA V PATEL  IMPRESSIONS   1. Left ventricular ejection fraction, by estimation, is 55 to 60%. The left ventricle has normal function. The left ventricle has no regional wall motion abnormalities. Left ventricular diastolic parameters were normal. 2. Right ventricular systolic function is normal. The right ventricular size is normal. Tricuspid regurgitation signal is inadequate for assessing PA pressure. 3. Left atrial size was mildly dilated. 4. The mitral valve is normal in structure. Trivial mitral valve regurgitation. No evidence of mitral stenosis. 5. The aortic valve is normal in structure. Aortic valve regurgitation is not visualized. No aortic stenosis is present. 6. The inferior vena cava is normal in size with greater than 50% respiratory variability, suggesting right atrial pressure of 3 mmHg.  Comparison(s): No significant change from prior study.  Conclusion(s)/Recommendation(s): Normal biventricular function without evidence of hemodynamically significant valvular heart disease.  FINDINGS Left Ventricle: Left ventricular ejection fraction, by estimation, is 55 to 60%. The left ventricle has normal function. The left ventricle has no regional wall motion abnormalities. Definity  contrast agent was given IV to delineate the left ventricular endocardial borders. The left ventricular internal cavity size was normal in size. There is no left ventricular hypertrophy. Left ventricular diastolic parameters were normal.  Right Ventricle: The right ventricular size is normal. No increase in right ventricular wall thickness. Right ventricular systolic function is normal. Tricuspid regurgitation signal is inadequate for assessing PA pressure.  Left Atrium: Left atrial size was mildly  dilated.  Right Atrium: Right atrial size was normal in size.  Pericardium: There is no evidence of pericardial effusion.  Mitral Valve: The mitral valve is normal in structure. Trivial mitral valve regurgitation. No evidence of mitral valve stenosis.  Tricuspid Valve: The tricuspid valve is normal in structure. Tricuspid valve regurgitation is not demonstrated. No evidence of tricuspid stenosis.  Aortic Valve: The aortic valve is normal in structure. Aortic valve regurgitation is not visualized. No aortic stenosis is present.  Pulmonic Valve: The pulmonic valve was normal in structure. Pulmonic valve regurgitation is not visualized. No evidence of pulmonic stenosis.  Aorta: The aortic root and ascending aorta are structurally normal, with no evidence of dilitation.  Venous: The inferior vena cava is normal in size with greater than 50% respiratory variability, suggesting right atrial pressure of 3 mmHg.  IAS/Shunts: No atrial level shunt detected by color flow Doppler.   LEFT VENTRICLE PLAX 2D LVIDd:         4.40 cm   Diastology LVIDs:         3.50 cm   LV e' medial:    8.92 cm/s LV PW:  0.90 cm   LV E/e' medial:  7.0 LV IVS:        1.00 cm   LV e' lateral:   14.80 cm/s LVOT diam:     2.50 cm   LV E/e' lateral: 4.2 LVOT Area:     4.91 cm   RIGHT VENTRICLE RV Basal diam:  3.10 cm RV Mid diam:    3.40 cm RV S prime:     13.50 cm/s  LEFT ATRIUM              Index        RIGHT ATRIUM           Index LA diam:        4.00 cm  1.45 cm/m   RA Area:     20.60 cm LA Vol (A2C):   74.3 ml  26.86 ml/m  RA Volume:   57.40 ml  20.75 ml/m LA Vol (A4C):   100.0 ml 36.15 ml/m LA Biplane Vol: 87.5 ml  31.63 ml/m  AORTA Ao Root diam: 3.80 cm Ao Asc diam:  3.20 cm  MITRAL VALVE MV Area (PHT): 4.17 cm    SHUNTS MV Decel Time: 182 msec    Systemic Diam: 2.50 cm MV E velocity: 62.40 cm/s MV A velocity: 41.40 cm/s MV E/A ratio:  1.51  Georganna Archer Electronically signed  by Georganna Archer Signature Date/Time: 10/07/2024/12:59:34 PM    Final          ______________________________________________________________________________________________                  Physical Exam: VS: BP 126/68 (BP Location: Left Arm, Patient Position: Sitting, Cuff Size: Large)   Pulse 62   Ht 6' 3 (1.905 m)   BMI 41.25 kg/m   GEN: In wheelchair, well nourished, in NAD HEENT: Normal NECK: No JVD CARDIAC: RRR, no murmurs, rubs, gallops RESPIRATORY: Clear to auscultation without rales, wheezing or rhonchi  ABDOMEN: Soft, non-tender, non-distended MUSCULOSKELETAL: Bilateral non-pitting LE edema SKIN: Warm and dry NEUROLOGIC:  Alert and oriented x 3 PSYCHIATRIC:  Normal affect   Assessment & Plan: 1. CAD: Most recent cardiac catheterization 11/24/2024 with significant diffuse disease with complex PCI and Aggrastat  for 18 hours.  Denies cardiac symptoms since this procedure. - Okay to discontinue aspirin  January 18 per Dr. Wonda - Continue Plavix  75 mg daily for at least 6 months  2. PAF: EKG today NSR 62 bpm.  Denies palpitations. CHA2DS2-VASc=2.2. - Continue Eliquis  5 mg twice daily  - Continue Toprol -XL 25 mg daily  3. Hypertension: BP today 126/68, similar to home readings.  Well-controlled. - Continue olmesartan  10 mg daily  4. Hyperlipidemia: 10/10/24 LDL 66, HDL 46, TGs 64, total 125, AST 14, ALT 18. 11/25/24 LPA 173.  Given elevation in LPA, he would likely benefit from Repatha. - Refer to lipid clinic - Continue atorvastatin  40 mg daily - Continue ezetimibe  10 mg daily  5. HFpEF: Echo 10/07/2024 LVEF 55 to 60%, trivial MR. Has chronic LE edema.  Lung sounds clear, nonpitting bilateral LE edema. - Continue Lasix  20 mg daily as needed for edema, dyspnea, or weight gain - Continue KCl 20 mEq daily as needed when taking Lasix     Dispo: Follow-up with Dr. Wonda as scheduled 02/13/2025.  Signed, Saddie GORMAN Cleaves, NP 12/06/2024 3:57 PM [facility] "

## 2024-12-12 ENCOUNTER — Encounter: Payer: Self-pay | Admitting: Cardiovascular Disease

## 2024-12-14 ENCOUNTER — Telehealth: Payer: Self-pay | Admitting: Cardiovascular Disease

## 2024-12-14 NOTE — Telephone Encounter (Signed)
" °*  STAT* If patient is at the pharmacy, call can be transferred to refill team.   1. Which medications need to be refilled? (please list name of each medication and dose if known)   nitroGLYCERIN  (NITROSTAT ) 0.4 MG SL tablet   2. Would you like to learn more about the convenience, safety, & potential cost savings by using the [facility]?   3. Are you open to using the [facility] (Type [facility]. ).  4. Which pharmacy/location (including street and city if local pharmacy) is medication to be sent to?  [id number] - [place], [place] - [address]   5. Do they need a 30 day or 90 day supply?   Patient stated his medication has expired.  "

## 2024-12-15 ENCOUNTER — Other Ambulatory Visit: Payer: Self-pay

## 2024-12-15 MED ORDER — NITROGLYCERIN 0.4 MG SL SUBL: SUBLINGUAL_TABLET | SUBLINGUAL | 6 refills | Status: AC

## 2024-12-16 ENCOUNTER — Ambulatory Visit: Admitting: Psychology

## 2024-12-16 DIAGNOSIS — F331 Major depressive disorder, recurrent, moderate: Secondary | ICD-10-CM

## 2024-12-16 NOTE — Progress Notes (Signed)
 "  [facility] Counselor/Therapist Progress Note  Patient ID: Keith Morrow, MRN: [id number],    Date: 12/16/2024  Time Spent: 2:00pm-2:55pm  55 minutes   Treatment Type: Individual Therapy  Reported Symptoms: stress, sadness  Mental Status Exam: Appearance:  Casual     Behavior: Appropriate  Motor: Normal  Speech/Language:  Normal Rate  Affect: Appropriate  Mood: normal  Thought process: normal  Thought content:   WNL  Sensory/Perceptual disturbances:   WNL  Orientation: oriented to person, place, time/date, and situation  Attention: Good  Concentration: Good  Memory: WNL  Fund of knowledge:  Good  Insight:   Good  Judgment:  Good  Impulse Control: Good   Risk Assessment: Danger to Self:  No Self-injurious Behavior: No Danger to Others: No Duty to Warn:no Physical Aggression / Violence:No  Access to Firearms a concern: No  Gang Involvement:No   Subjective: Pt present for face-to-face individual therapy via video.  Pt consents to telehealth video session and is aware of limitations and benefits of virtual sessions.  Location of pt: home Location of therapist: home office.   Pt talked about his health.  He had a cardiac cath December 18th.  The procedure took a lot longer than anticipated.   There was a lot of plaque and blockages that had to be removed.  There were also some complications.  Addressed the issues and how they impacted pt.   Pt was told by his doctor that he needs to lose weight.   Pt felt like the doctor was rude and it upset pt.   Pt has had some chest pain recently and is planning to see his medical providers about it.  Pt states he feels like he is physically falling apart.  Pt talked about work.  He has been under a lot of stress.  Addressed pt's work stress and worked on pharmacologist.    Pt talked about the holidays.  Pt states it was a good visit with family.  Debby and his girlfriend bought a house together and are planning to get  married.   Pt talked about having a couple of disappointments that he is hyper focused on. Pt found out he is not on an insurance policy he thought he was on.  Pt has been ruminating about this for days and feels like the world is against him. Jenkins is working on trouble shooting the issue.  Addressed pt's frustrations and disappointments.  Provided supportive therapy.    Interventions: Cognitive Behavioral Therapy and Insight-Oriented  Diagnosis:  F33.1  Plan of Care: Recommend ongoing therapy.  Pt participated in setting treatment goals.  Plan to meet monthly.   Pt agrees with treatment plan.    Treatment Plan  (Treatment Plan Target Date: 04/29/2025) Client Abilities/Strengths  Pt is bright, engaging, and motivated for therapy.   Client Treatment Preferences  Individual therapy.  Client Statement of Needs  Improve coping skills.  Symptoms  Depressed or irritable mood. Feelings of hopelessness, worthlessness, or inappropriate guilt. Low self-esteem.  Problems Addressed  Unipolar Depression Goals 1. Alleviate depressive symptoms and return to previous level of effective functioning. 2. Appropriately grieve the loss in order to normalize mood and to return to previously adaptive level of functioning. Objective Learn and implement behavioral strategies to overcome depression. Target Date: 2025-04-29 Frequency: Monthly  Progress: 45 Modality: individual  Related Interventions Engage the client in behavioral activation, increasing his/her activity level and contact with sources of reward, while identifying processes that inhibit  activation.  Use behavioral techniques such as instruction, rehearsal, role-playing, role reversal, as needed, to facilitate activity in the client's daily life; reinforce success. Assist the client in developing skills that increase the likelihood of deriving pleasure from behavioral activation (e.g., assertiveness skills, developing an exercise plan, less  internal/more external focus, increased social involvement); reinforce success. Objective Identify important people in life, past and present, and describe the quality, good and poor, of those relationships. Target Date: 2025-04-29 Frequency: Monthly  Progress: 65 Modality: individual  Related Interventions Conduct Interpersonal Therapy beginning with the assessment of the client's interpersonal inventory of important past and present relationships; develop a case formulation linking depression to grief, interpersonal role disputes, role transitions, and/or interpersonal deficits). Objective Learn and implement problem-solving and decision-making skills. Target Date: 2025-04-29 Frequency: Monthly  Progress: 65 Modality: individual  Related Interventions Conduct Problem-Solving Therapy using techniques such as psychoeducation, modeling, and role-playing to teach client problem-solving skills (i.e., defining a problem specifically, generating possible solutions, evaluating the pros and cons of each solution, selecting and implementing a plan of action, evaluating the efficacy of the plan, accepting or revising the plan); role-play application of the problem-solving skill to a real life issue. Encourage in the client the development of a positive problem orientation in which problems and solving them are viewed as a natural part of life and not something to be feared, despaired, or avoided. 3. Develop healthy interpersonal relationships that lead to the alleviation and help prevent the relapse of depression. 4. Develop healthy thinking patterns and beliefs about self, others, and the world that lead to the alleviation and help prevent the relapse of depression. 5. Recognize, accept, and cope with feelings of depression. Diagnosis F33.1  Conditions For Discharge Achievement of treatment goals and objectives   Veva Alma, LCSW    "

## 2024-12-20 ENCOUNTER — Telehealth: Payer: Self-pay | Admitting: *Deleted

## 2024-12-20 ENCOUNTER — Other Ambulatory Visit (HOSPITAL_BASED_OUTPATIENT_CLINIC_OR_DEPARTMENT_OTHER): Payer: Self-pay

## 2024-12-20 ENCOUNTER — Other Ambulatory Visit (HOSPITAL_COMMUNITY): Payer: Self-pay

## 2024-12-20 NOTE — Telephone Encounter (Signed)
 Received order from [facility] for baclofen  pump refill to include Baclofen  1620 mcg/mL, Hydromorphone  500 mcg/mL, Clonidine 420 mcg/mL 40 mL. I called [facility] & spoke with Albino in nursing dept. Upon review of chart, on 09/19/24 there had a been a change in order reflecting new concentration of Baclofen  1620 mcg/mL, reduction by 10%. Per last OV, Dr Vear specified Baclofen  to be 1800 mcg/mL, Hydromorphone  500 mcg/mL, Clonidine 420 mcg/mL. The previous reduction in Baclofen  dose did not help.   We can make changes on order if needed. Dr Vear to sign.

## 2024-12-21 NOTE — Telephone Encounter (Signed)
 It has been a while, but I have a vague recollection that I did do a peer to peer to approve his test before it was done. Does billing have a record of an approval?

## 2024-12-21 NOTE — Telephone Encounter (Signed)
" °  Faxed to [facility] [phone] "

## 2024-12-29 NOTE — Telephone Encounter (Signed)
 [employer]ML[employer] form completed and given to patient by Dr. Wonda while in the hospital.  Document was not scanned into chart. Billing notified.

## 2025-01-02 ENCOUNTER — Encounter: Payer: Self-pay | Admitting: Neurology

## 2025-01-03 MED ORDER — METHYLPHENIDATE HCL ER (OSM) 54 MG PO TBCR: 54.0000 mg | EXTENDED_RELEASE_TABLET | ORAL | 0 refills | Status: AC

## 2025-01-03 NOTE — Telephone Encounter (Signed)
 Requested Prescriptions   Pending Prescriptions Disp Refills   methylphenidate  (CONCERTA ) 54 MG PO CR tablet 30 tablet 0    Sig: Take 1 tablet (54 mg total) by mouth every morning.   Last seen 11/02/24 Next appt 06/08/25  Dispenses   Dispensed Days Supply Quantity Provider Pharmacy  METHYLPHENIDATE  ER 54 MG TAB 12/06/2024 30 30 each Vear Charlie LABOR, MD [phone] - [facility]...  METHYLPHENIDATE  ER 54 MG TAB 11/02/2024 30 30 each Vear Charlie LABOR, MD [facility] [phone] - [facility]...  METHYLPHENIDATE  10 MG TABLET 10/10/2024 30 120 each Vear Charlie LABOR, MD [facility] [phone] - [facility]...  METHYLPHENIDATE  10 MG TABLET 08/09/2024 30 120 each Vear Charlie LABOR, MD [facility] [phone] - [facility]...  METHYLPHENIDATE  10 MG TABLET 03/07/2024 30 120 each Vear Charlie LABOR, MD [facility] [phone] - [place]

## 2025-01-11 ENCOUNTER — Telehealth: Payer: Self-pay

## 2025-01-11 NOTE — Telephone Encounter (Signed)
" ° °  Faxed to [facility] at [phone] "

## 2025-01-13 ENCOUNTER — Ambulatory Visit: Admitting: Psychology

## 2025-01-13 ENCOUNTER — Telehealth (HOSPITAL_COMMUNITY): Payer: Self-pay

## 2025-01-13 DIAGNOSIS — F331 Major depressive disorder, recurrent, moderate: Secondary | ICD-10-CM

## 2025-01-13 NOTE — Telephone Encounter (Signed)
Pt is not interested in the cardiac rehab program. Closed referral 

## 2025-01-13 NOTE — Progress Notes (Signed)
 "  [facility] Counselor/Therapist Progress Note  Patient ID: NARADA UZZLE, MRN: [id number],    Date: 01/13/2025  Time Spent: 2:00pm-2:50pm  50 minutes   Treatment Type: Individual Therapy  Reported Symptoms: stress, frustration  Mental Status Exam: Appearance:  Casual     Behavior: Appropriate  Motor: Normal  Speech/Language:  Normal Rate  Affect: Appropriate  Mood: normal  Thought process: normal  Thought content:   WNL  Sensory/Perceptual disturbances:   WNL  Orientation: oriented to person, place, time/date, and situation  Attention: Good  Concentration: Good  Memory: WNL  Fund of knowledge:  Good  Insight:   Good  Judgment:  Good  Impulse Control: Good   Risk Assessment: Danger to Self:  No Self-injurious Behavior: No Danger to Others: No Duty to Warn:no Physical Aggression / Violence:No  Access to Firearms a concern: No  Gang Involvement:No   Subjective: Pt present for face-to-face individual therapy via video.  Pt consents to telehealth video session and is aware of limitations and benefits of virtual sessions.  Location of pt: home Location of therapist: home office.   Pt talked about issues with his son Elsie.   He has not been following through with things he needs to do.  He has been argumentative and Jenkins got mad at him.   Addressed the interactions and family dynamics.   Elsie needs to get a job but is playing video games instead.  This is frustrating for pt and [name].  They thought Elsie wanted to get his PA degree but he is not taking the next steps toward that.  Elsie just finished school in December.  He graduated with his bachelor's degree.   Pt is concerned that Elsie doesn't have the motivation to get a job.   Addressed how pt can communicate effectively with Elsie to address his motivation issues and to explore what William's goals are.  Reminded pt that Elsie has been the good kid of the family and has historically been the  most helpful with pt's care and household chores.   Addressed how pt can affirm Elsie for what he has done and explore what he is feeling now that is creating some of the rebellious behavior.  Provided supportive therapy.    Interventions: Cognitive Behavioral Therapy and Insight-Oriented  Diagnosis:  F33.1  Plan of Care: Recommend ongoing therapy.  Pt participated in setting treatment goals.  Plan to meet monthly.   Pt agrees with treatment plan.    Treatment Plan  (Treatment Plan Target Date: 04/29/2025) Client Abilities/Strengths  Pt is bright, engaging, and motivated for therapy.   Client Treatment Preferences  Individual therapy.  Client Statement of Needs  Improve coping skills.  Symptoms  Depressed or irritable mood. Feelings of hopelessness, worthlessness, or inappropriate guilt. Low self-esteem.  Problems Addressed  Unipolar Depression Goals 1. Alleviate depressive symptoms and return to previous level of effective functioning. 2. Appropriately grieve the loss in order to normalize mood and to return to previously adaptive level of functioning. Objective Learn and implement behavioral strategies to overcome depression. Target Date: 2025-04-29 Frequency: Monthly  Progress: 57 Modality: individual  Related Interventions Engage the client in behavioral activation, increasing his/her activity level and contact with sources of reward, while identifying processes that inhibit activation.  Use behavioral techniques such as instruction, rehearsal, role-playing, role reversal, as needed, to facilitate activity in the client's daily life; reinforce success. Assist the client in developing skills that increase the likelihood of deriving pleasure from behavioral activation (  e.g., assertiveness skills, developing an exercise plan, less internal/more external focus, increased social involvement); reinforce success. Objective Identify important people in life, past and present, and  describe the quality, good and poor, of those relationships. Target Date: 2025-04-29 Frequency: Monthly  Progress: 65 Modality: individual  Related Interventions Conduct Interpersonal Therapy beginning with the assessment of the client's interpersonal inventory of important past and present relationships; develop a case formulation linking depression to grief, interpersonal role disputes, role transitions, and/or interpersonal deficits). Objective Learn and implement problem-solving and decision-making skills. Target Date: 2025-04-29 Frequency: Monthly  Progress: 65 Modality: individual  Related Interventions Conduct Problem-Solving Therapy using techniques such as psychoeducation, modeling, and role-playing to teach client problem-solving skills (i.e., defining a problem specifically, generating possible solutions, evaluating the pros and cons of each solution, selecting and implementing a plan of action, evaluating the efficacy of the plan, accepting or revising the plan); role-play application of the problem-solving skill to a real life issue. Encourage in the client the development of a positive problem orientation in which problems and solving them are viewed as a natural part of life and not something to be feared, despaired, or avoided. 3. Develop healthy interpersonal relationships that lead to the alleviation and help prevent the relapse of depression. 4. Develop healthy thinking patterns and beliefs about self, others, and the world that lead to the alleviation and help prevent the relapse of depression. 5. Recognize, accept, and cope with feelings of depression. Diagnosis F33.1  Conditions For Discharge Achievement of treatment goals and objectives   Veva Alma, LCSW    "

## 2025-01-26 ENCOUNTER — Ambulatory Visit: Admitting: Pharmacist

## 2025-02-09 ENCOUNTER — Ambulatory Visit: Admitting: Psychology

## 2025-02-13 ENCOUNTER — Ambulatory Visit: Admitting: Cardiovascular Disease

## 2025-02-13 ENCOUNTER — Ambulatory Visit: Admitting: Family Medicine

## 2025-03-09 ENCOUNTER — Ambulatory Visit: Admitting: Psychology

## 2025-06-01 ENCOUNTER — Encounter: Admitting: Family Medicine

## 2025-06-08 ENCOUNTER — Ambulatory Visit: Admitting: Neurology
